# Patient Record
Sex: Male | Born: 1940 | Race: White | Hispanic: No | Marital: Married | Smoking: Former smoker
Health system: Southern US, Community
[De-identification: ages and names within clinical notes are randomized; demographics above are authoritative.]

## PROBLEM LIST (undated history)

## (undated) DIAGNOSIS — I428 Other cardiomyopathies: Secondary | ICD-10-CM

## (undated) DIAGNOSIS — Z95810 Presence of automatic (implantable) cardiac defibrillator: Secondary | ICD-10-CM

## (undated) DIAGNOSIS — R0600 Dyspnea, unspecified: Secondary | ICD-10-CM

## (undated) DIAGNOSIS — R002 Palpitations: Secondary | ICD-10-CM

## (undated) DIAGNOSIS — F419 Anxiety disorder, unspecified: Secondary | ICD-10-CM

## (undated) DIAGNOSIS — E782 Mixed hyperlipidemia: Secondary | ICD-10-CM

## (undated) DIAGNOSIS — I493 Ventricular premature depolarization: Secondary | ICD-10-CM

## (undated) DIAGNOSIS — I447 Left bundle-branch block, unspecified: Secondary | ICD-10-CM

## (undated) DIAGNOSIS — E119 Type 2 diabetes mellitus without complications: Secondary | ICD-10-CM

## (undated) DIAGNOSIS — C801 Malignant (primary) neoplasm, unspecified: Secondary | ICD-10-CM

## (undated) DIAGNOSIS — I509 Heart failure, unspecified: Secondary | ICD-10-CM

## (undated) DIAGNOSIS — N529 Male erectile dysfunction, unspecified: Secondary | ICD-10-CM

## (undated) DIAGNOSIS — K219 Gastro-esophageal reflux disease without esophagitis: Secondary | ICD-10-CM

## (undated) DIAGNOSIS — I10 Essential (primary) hypertension: Secondary | ICD-10-CM

## (undated) DIAGNOSIS — J189 Pneumonia, unspecified organism: Secondary | ICD-10-CM

## (undated) DIAGNOSIS — I5022 Chronic systolic (congestive) heart failure: Secondary | ICD-10-CM

## (undated) DIAGNOSIS — R079 Chest pain, unspecified: Secondary | ICD-10-CM

## (undated) DIAGNOSIS — G4700 Insomnia, unspecified: Secondary | ICD-10-CM

## (undated) DIAGNOSIS — I420 Dilated cardiomyopathy: Secondary | ICD-10-CM

## (undated) DIAGNOSIS — E785 Hyperlipidemia, unspecified: Secondary | ICD-10-CM

## (undated) DIAGNOSIS — R062 Wheezing: Secondary | ICD-10-CM

## (undated) HISTORY — PX: NASAL SINUS SURGERY: SHX719

## (undated) HISTORY — DX: Hyperlipidemia, unspecified: E78.5

## (undated) HISTORY — DX: Essential (primary) hypertension: I10

## (undated) HISTORY — DX: Insomnia, unspecified: G47.00

## (undated) HISTORY — DX: Dilated cardiomyopathy: I42.0

## (undated) HISTORY — DX: Chronic systolic (congestive) heart failure: I50.22

## (undated) HISTORY — DX: Male erectile dysfunction, unspecified: N52.9

---

## 2001-03-05 ENCOUNTER — Encounter: Admission: RE | Admit: 2001-03-05 | Discharge: 2001-03-05 | Payer: Self-pay | Admitting: Family Medicine

## 2001-03-05 ENCOUNTER — Encounter: Payer: Self-pay | Admitting: Family Medicine

## 2001-08-05 ENCOUNTER — Encounter: Admission: RE | Admit: 2001-08-05 | Discharge: 2001-08-05 | Payer: Self-pay | Admitting: Family Medicine

## 2001-08-05 ENCOUNTER — Encounter: Payer: Self-pay | Admitting: Family Medicine

## 2003-09-01 ENCOUNTER — Ambulatory Visit (HOSPITAL_COMMUNITY): Admission: RE | Admit: 2003-09-01 | Discharge: 2003-09-01 | Payer: Self-pay | Admitting: *Deleted

## 2010-08-11 ENCOUNTER — Encounter: Payer: Self-pay | Admitting: Family Medicine

## 2011-04-29 ENCOUNTER — Ambulatory Visit (INDEPENDENT_AMBULATORY_CARE_PROVIDER_SITE_OTHER): Payer: BC Managed Care – PPO | Admitting: Pulmonary Disease

## 2011-04-29 ENCOUNTER — Encounter: Payer: Self-pay | Admitting: Pulmonary Disease

## 2011-04-29 VITALS — BP 126/86 | HR 88 | Temp 98.0°F | Ht 71.0 in | Wt 232.4 lb

## 2011-04-29 DIAGNOSIS — R062 Wheezing: Secondary | ICD-10-CM

## 2011-04-29 DIAGNOSIS — R0609 Other forms of dyspnea: Secondary | ICD-10-CM

## 2011-04-29 DIAGNOSIS — R0600 Dyspnea, unspecified: Secondary | ICD-10-CM

## 2011-04-29 HISTORY — DX: Dyspnea, unspecified: R06.00

## 2011-04-29 HISTORY — DX: Wheezing: R06.2

## 2011-04-29 NOTE — Progress Notes (Signed)
  Subjective:    Patient ID: Louis Juarez, male    DOB: 03/31/1941, 70 y.o.   MRN: [id number]  HPI The patient is a 70 year old male who I've been asked to see for wheezing and dyspnea.  The patient was in his usual state of health with excellent exercise tolerance, and told this summer.  He was working in [employer], and developed severe shortness of breath while trying to walk up a large hill.  He also noted audible wheezing during this time, and he was seen by his primary care doctor where he was treated with an antibiotic.  The patient states that his wheezing resolved after this.  His wheezing has subsequently returned, and the patient states that it is worse at night upon lying down.  He states that throat clearing will usually result in improvement in the wheezing.  The patient admits to having significant postnasal drip, and has a history of recurrent sinusitis.  He denies any history of reflux disease.  The patient states that his dyspnea on exertion has persisted, but only occurs with significant exertional activity.  He states that he can walk unlimited distance on flat ground, even at a moderate pace.  He will get winded climbing stairs and large hills area the patient has a long history of tobacco abuse, but has not smoked since 2006.  He has had spirometry with his primary care doctor, but states that he had a hard time doing this study.  His last x-ray was in June of this year, and he was told that he had "fluid on his lungs".  He denies any known history of heart disease.  His weight has been stable over the last year.  It should be noted the patient takes an ACE inhibitor, and has done so for years.   Review of Systems  Constitutional: Negative for fever and unexpected weight change.  HENT: Positive for congestion and postnasal drip. Negative for ear pain, nosebleeds, sore throat, rhinorrhea, sneezing, trouble swallowing, dental problem and sinus pressure.   Eyes: Negative for redness  and itching.  Respiratory: Positive for cough and shortness of breath. Negative for chest tightness and wheezing.   Cardiovascular: Negative for palpitations and leg swelling.  Gastrointestinal: Negative for nausea and vomiting.  Genitourinary: Negative for dysuria.  Musculoskeletal: Negative for joint swelling.  Skin: Negative for rash.  Neurological: Negative for headaches.  Hematological: Does not bruise/bleed easily.  Psychiatric/Behavioral: Negative for dysphoric mood. The patient is not nervous/anxious.        Objective:   Physical Exam Constitutional:  Overweight male, no acute distress  HENT:  Nares patent without discharge  Oropharynx without exudate, palate and uvula are normal  Eyes:  Right pupil > left, eomi, no scleral icterus  Neck:  No JVD, no TMG  Cardiovascular:  Normal rate, regular rhythm, no rubs or gallops.  No murmurs        Intact distal pulses  Pulmonary :  Mildly decreased breath sounds, no stridor or respiratory distress   No rales, rhonchi, or wheezing  Abdominal:  Soft, nondistended, bowel sounds present.  No tenderness noted.   Musculoskeletal:  No lower extremity edema noted.  Lymph Nodes:  No cervical lymphadenopathy noted  Skin:  No cyanosis noted  Neurologic:  Alert, appropriate, moves all 4 extremities without obvious deficit.        Assessment & Plan:

## 2011-04-29 NOTE — Assessment & Plan Note (Signed)
The patient has dyspnea on exertion primarily with heavier activities, but he feels this is new for him.  He has no significant obstruction on spirometry today, but does have some mild restriction.  I suspect this is due to his centripetal obesity.I have asked him to work on weight loss and also an exercise program, but if his shortness of breath persists, would consider whether he needs a cardiac evaluation.

## 2011-04-29 NOTE — Patient Instructions (Addendum)
You do not have emphysema by your breathing tests today.  You do need to work on weight loss to help your breathing.  Stop altace Would try chlorpheniramine 8mg  OTC at bedtime for the next few weeks to see if it helps your postnasal drip Please give me some feedback in next 3-4 weeks with how the wheezing is doing.

## 2011-04-29 NOTE — Assessment & Plan Note (Signed)
The patient is describing a wheeze that is most likely coming from the upper airway.  He has postnasal drip and a history of recurrent sinusitis, but also is taking an ACE inhibitor which can destabilize the upper airway.  I have asked him to try chlorpheniramine over-the-counter, and also would discontinue his ACE inhibitor for a period of time to see if things improve.

## 2011-06-03 ENCOUNTER — Encounter: Payer: Self-pay | Admitting: Pulmonary Disease

## 2011-06-03 ENCOUNTER — Telehealth: Payer: Self-pay | Admitting: Pulmonary Disease

## 2011-06-03 ENCOUNTER — Ambulatory Visit (INDEPENDENT_AMBULATORY_CARE_PROVIDER_SITE_OTHER): Payer: BC Managed Care – PPO | Admitting: Pulmonary Disease

## 2011-06-03 DIAGNOSIS — R0609 Other forms of dyspnea: Secondary | ICD-10-CM

## 2011-06-03 DIAGNOSIS — R0600 Dyspnea, unspecified: Secondary | ICD-10-CM

## 2011-06-03 DIAGNOSIS — R062 Wheezing: Secondary | ICD-10-CM

## 2011-06-03 DIAGNOSIS — R0989 Other specified symptoms and signs involving the circulatory and respiratory systems: Secondary | ICD-10-CM

## 2011-06-03 NOTE — Patient Instructions (Signed)
Start on nexium am and pm for the next few weeks. Will check scan of your sinuses, and call you with results. If the scan of your sinuses is normal, will start on dulera 100/5  2 inhalations am and pm.  Rinse mouth well.   If you start on the inhaler, I want you to call me in 2 weeks to let me know how things are going.

## 2011-06-03 NOTE — Assessment & Plan Note (Signed)
The patient continues to have audible wheezing, however had no airflow obstruction on spirometry last visit.  He does have some squeaks in his upper lung zones anteriorly, but posteriorly is totally clear.  I continued to believe this is more upper airway in origin, and with his history of severe sinusitis in the past with multiple surgeries, would like to do a scan of his sinuses.  If this fails to show any significant sinusitis, I will give him a trial of LABA/ICS.  We'll also treat him emperically for laryngopharyngeal reflux, although he does not feel this is a significant issue.

## 2011-06-03 NOTE — Progress Notes (Signed)
  Subjective:    Patient ID: Louis Juarez, male    DOB: [birth year], 70 y.o.   MRN: [id number]  HPI Patient comes in today for followup of his "wheezing" and also dyspnea on exertion.  At the last visit, he was taken off his ACE inhibitor, and also asked to take a sedating antihistamine for postnasal drip.  He comes in today where he really has not seen a big difference in his symptoms.  He states that his wheezing is worse at night, and also in the mornings upon arising.  He does not have increased shortness of breath during this time, but only with heavier exertional activities.   Review of Systems  Constitutional: Negative for fever and unexpected weight change.  HENT: Negative for ear pain, nosebleeds, congestion, sore throat, rhinorrhea, sneezing, trouble swallowing, dental problem, postnasal drip and sinus pressure.   Eyes: Negative for redness and itching.  Respiratory: Positive for cough, shortness of breath and wheezing. Negative for chest tightness.   Cardiovascular: Negative for palpitations and leg swelling.  Gastrointestinal: Negative for nausea and vomiting.  Genitourinary: Negative for dysuria.  Musculoskeletal: Negative for joint swelling.  Skin: Negative for rash.  Neurological: Negative for headaches.  Hematological: Does not bruise/bleed easily.  Psychiatric/Behavioral: Negative for dysphoric mood. The patient is not nervous/anxious.        Objective:   Physical Exam Obese male in no acute distress Nose without obvious purulence or discharge Chest with inspiratory squeaks near the clavicles anteriorly, but otherwise totally clear to auscultation Heart exam with regular rate and rhythm Lower extremities without edema, no cyanosis noted Alert and oriented, moves all 4 extremities.       Assessment & Plan:

## 2011-06-03 NOTE — Telephone Encounter (Signed)
Spoke with pt and he c/o increased SOB and wheeze x 2 days. States that "sounds like two cats fighting in my chest"- OV with KC at 3:45 and advised seek emergency care in the meantime if needed.

## 2011-06-05 ENCOUNTER — Ambulatory Visit (INDEPENDENT_AMBULATORY_CARE_PROVIDER_SITE_OTHER)
Admission: RE | Admit: 2011-06-05 | Discharge: 2011-06-05 | Disposition: A | Discharge status: Home / Self Care | Payer: BC Managed Care – PPO | Source: Ambulatory Visit | Attending: Pulmonary Disease | Admitting: Pulmonary Disease

## 2011-06-05 DIAGNOSIS — R0609 Other forms of dyspnea: Secondary | ICD-10-CM

## 2011-06-05 DIAGNOSIS — R062 Wheezing: Secondary | ICD-10-CM

## 2011-06-05 DIAGNOSIS — R0600 Dyspnea, unspecified: Secondary | ICD-10-CM

## 2011-06-09 ENCOUNTER — Other Ambulatory Visit: Payer: Self-pay | Admitting: *Deleted

## 2011-06-09 MED ORDER — ESOMEPRAZOLE MAGNESIUM 40 MG PO CPDR: 40.0000 mg | DELAYED_RELEASE_CAPSULE | Freq: Two times a day (BID) | ORAL | Status: DC

## 2011-06-20 ENCOUNTER — Telehealth: Payer: Self-pay | Admitting: Pulmonary Disease

## 2011-06-20 NOTE — Telephone Encounter (Signed)
Spoke with pt and notified of recs per PW. Pt verbalized understanding and will come in around 2 pm this afternoon for [facility] instruct. Lori aware sample in triage.

## 2011-06-20 NOTE — Telephone Encounter (Signed)
Have him stop by office to pick up 200 Dulera two puff bid,  While here, reinstruct him as to proper use of HFA inhaler Keep 12/21 OV with [facility]

## 2011-06-20 NOTE — Telephone Encounter (Signed)
Pt came in this afternoon and i re-instructed him on the use of the Dulera 2 puffs bid. Rinse mouth out after use.  Pt verbalized his understanding.  Pt did comment that although the Nexium has helped decrease his mucus "a lot" he does notice he will have increased mucus when he eats spicy foods and wanted to know if we had an information regarding diet, so I gave him the GERD diet sheet.  Pt appreciated this.

## 2011-06-20 NOTE — Telephone Encounter (Signed)
Called and spoke with pt. He states that [facility] gave recs to start nexium bid and also dulera 100 2 puffs bid. He states that since starting these meds his wheezing and mucus production is much less, but DOE seems worse just over the past couple of days. He states got OOB walking approx 10 steps last night. Has ov pending with KC for 07/11/11 but wants to see him sooner and does not wish to see another provider. No openings next wk with KC. Will have to forward to doc of the day. Please advise, thanks!

## 2011-06-23 ENCOUNTER — Telehealth: Payer: Self-pay | Admitting: Pulmonary Disease

## 2011-06-23 NOTE — Telephone Encounter (Signed)
I spoke with Louis Juarez and she states to see if pt can come in at 10:15 tomorrow am. I advised pt of this and he stated that was fine that he could come in at that time. Apt has been made and nothing further was needed

## 2011-06-23 NOTE — Telephone Encounter (Signed)
Pt states he's returning call in reference to his appt tomorrow can be reached at [phone].Raylene Everts

## 2011-06-23 NOTE — Telephone Encounter (Signed)
Called and spoke with pt. He states that his breathing is not improving on higher dose of dulera. He states getting very SOB with exertion and wants appt with KC. Nothing available with [facility] today or tomorrow. I have sched him appt with TP for tomorrow am and advised if symptoms persist/worsen at all needs to go to ED. Pt verbalized understanding.

## 2011-06-23 NOTE — Telephone Encounter (Signed)
[email or web address]  

## 2011-06-23 NOTE — Telephone Encounter (Signed)
Patient returning call.

## 2011-06-24 ENCOUNTER — Ambulatory Visit (INDEPENDENT_AMBULATORY_CARE_PROVIDER_SITE_OTHER): Payer: BC Managed Care – PPO | Admitting: Adult Health

## 2011-06-24 ENCOUNTER — Ambulatory Visit (INDEPENDENT_AMBULATORY_CARE_PROVIDER_SITE_OTHER)
Admission: RE | Admit: 2011-06-24 | Discharge: 2011-06-24 | Disposition: A | Discharge status: Home / Self Care | Payer: BC Managed Care – PPO | Source: Ambulatory Visit | Attending: Adult Health | Admitting: Adult Health

## 2011-06-24 ENCOUNTER — Telehealth: Payer: Self-pay | Admitting: Pulmonary Disease

## 2011-06-24 ENCOUNTER — Ambulatory Visit: Payer: BC Managed Care – PPO | Admitting: Adult Health

## 2011-06-24 ENCOUNTER — Other Ambulatory Visit (INDEPENDENT_AMBULATORY_CARE_PROVIDER_SITE_OTHER): Payer: BC Managed Care – PPO

## 2011-06-24 ENCOUNTER — Encounter: Payer: Self-pay | Admitting: Adult Health

## 2011-06-24 DIAGNOSIS — R05 Cough: Secondary | ICD-10-CM

## 2011-06-24 DIAGNOSIS — R9389 Abnormal findings on diagnostic imaging of other specified body structures: Secondary | ICD-10-CM

## 2011-06-24 DIAGNOSIS — R0609 Other forms of dyspnea: Secondary | ICD-10-CM

## 2011-06-24 DIAGNOSIS — R042 Hemoptysis: Secondary | ICD-10-CM

## 2011-06-24 DIAGNOSIS — R0600 Dyspnea, unspecified: Secondary | ICD-10-CM

## 2011-06-24 DIAGNOSIS — R918 Other nonspecific abnormal finding of lung field: Secondary | ICD-10-CM

## 2011-06-24 LAB — BASIC METABOLIC PANEL
BUN: 15 mg/dL (ref 6–23)
Chloride: 105 mEq/L (ref 96–112)
GFR: 85.1 mL/min (ref >60.00)
Potassium: 4.2 mEq/L (ref 3.5–5.1)

## 2011-06-24 LAB — CBC WITH DIFFERENTIAL/PLATELET
Basophils Absolute: 0.1 10*3/uL (ref 0.0–0.1)
Eosinophils Absolute: 0.1 10*3/uL (ref 0.0–0.7)
Hemoglobin: 13.8 g/dL (ref 13.0–17.0)
Lymphocytes Relative: 18.5 % (ref 12.0–46.0)
MCHC: 34.2 g/dL (ref 30.0–36.0)
Monocytes Relative: 5.2 % (ref 3.0–12.0)
Neutro Abs: 7 10*3/uL (ref 1.4–7.7)
Neutrophils Relative %: 74.5 % (ref 43.0–77.0)
Platelets: 210 10*3/uL (ref 150.0–400.0)
RDW: 15.3 % — ABNORMAL HIGH (ref 11.5–14.6)

## 2011-06-24 LAB — SEDIMENTATION RATE: Sed Rate: 18 mm/hr (ref 0–22)

## 2011-06-24 LAB — BRAIN NATRIURETIC PEPTIDE: Pro B Natriuretic peptide (BNP): 405 pg/mL — ABNORMAL HIGH (ref 0.0–100.0)

## 2011-06-24 NOTE — Progress Notes (Signed)
Subjective:    Patient ID: Louis Juarez, male    DOB: 1940/08/03, 70 y.o.   MRN: [id number]  HPI 70 yo WM seen for initial pulmonary consult 04/29/11 for wheezing and dyspnea.   06/24/2011 Acute OV  Complains of  Pt was seen for initial pulmonary evaluation 04/29/11 for 4 months of wheezing and dyspnea. Seen by PCP given abx and told he had fluid on his xray . Symptoms started in June of this year. Prior to this he considered himself in good health with good exercise tolerance. 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 Initially taken off his ACE inhibitor by Dr. Shelle Iron without much improvement. Then seen 3 weeks ago , FEV1 at 71% w/ minimal obstruciton , ratio 71. He was started on Dulera and nexium along chlor tab. He says he had no improvement .  He has finished both. He has had 2 abx from his PCP office- last in 04/2011 . Feels better while on abx but never back to baseline and when off abx . symtpoms return. CT sinus done last ov with minimal sinus dz, no active infection   Today in the office , he has no desaturations with walking. Does have improvement in subjective symptoms with xopenex neb. Main complaint is DOE along with cough with intermittent sputum that changes from clear to green . Last 4 days has had some tr blood mixed with green mucus.  CXR today showed bilateral increased interstitial aspdz in bases. Review of films for last few months shows interstitial markings on xray . He denies unusual hobbies, has a cat. No known exposure to chemical or fumes. No weight loss or edema no family hx of heart dz.      Review of Systems Constitutional:   No  weight loss, night sweats,  Fevers, chills, fatigue, or  lassitude.  HEENT:   No headaches,  Difficulty swallowing,  Tooth/dental problems, or  Sore throat,                No sneezing, itching, ear ache, nasal congestion, post nasal drip,   CV:  No chest pain,  Orthopnea, PND,  swelling in lower extremities, anasarca, dizziness, palpitations, syncope.   GI  No heartburn, indigestion, abdominal pain, nausea, vomiting, diarrhea, change in bowel habits, loss of appetite, bloody stools.   Resp:    No chest wall deformity  Skin: no rash or lesions.  GU: no dysuria, change in color of urine, no urgency or frequency.  No flank pain, no hematuria   MS:  No joint pain or swelling.  No decreased range of motion.  No back pain.  Psych:  No change in mood or affect. No depression or anxiety.  No memory loss.         Objective:   Physical Exam GEN: A/Ox3; pleasant , NAD, well nourished   HEENT:  [place]/AT,  EACs-clear, TMs-wnl, NOSE-clear, THROAT-clear, no lesions, no postnasal drip or exudate noted.   NECK:  Supple w/ fair ROM; no JVD; normal carotid impulses w/o bruits; no thyromegaly or nodules palpated; no lymphadenopathy.  RESP  Coarse BS .no accessory muscle use, no dullness to percussion  CARD:  RRR, no m/r/g  , no peripheral edema, pulses intact, no cyanosis or clubbing.  GI:   Soft & nt; nml bowel sounds; no organomegaly or masses detected.  Musco: Warm bil, no deformities or joint swelling noted.   Neuro: alert, no focal deficits noted.  Skin: Warm, no lesions or rashes         Assessment & Plan:

## 2011-06-24 NOTE — Telephone Encounter (Signed)
Yes that is what the ct was suppose to be put in as  CT chest -High resolution

## 2011-06-24 NOTE — Telephone Encounter (Signed)
I spoke with CT dept and they are aware to do CT w/ high resolution

## 2011-06-24 NOTE — Patient Instructions (Signed)
May use ProAir 2 puffs every 4 hr as needed for shortness of breath/wheezing.  We are setting you up for a CT of chest - I will call with results.  I will call with labs.  I will be in touch regarding next appointment.

## 2011-06-24 NOTE — Progress Notes (Signed)
Ov reviewed.  Discussed case and plans with NP.

## 2011-06-24 NOTE — Assessment & Plan Note (Signed)
?   Etiology with abnormal xray -interstitial marking  Case and xray reviewed with Dr. Shelle Iron  Will proceed with CT chest -HR prior to any additional tr.  Will also check bnp and esr  Follow cT chest results.

## 2011-06-24 NOTE — Telephone Encounter (Signed)
I spoke with rose and she states pt is scheduled for his CT tomorrow. She is wanting to know does Tammy want high res cuts. Please advise, tammy, thanks

## 2011-06-25 ENCOUNTER — Ambulatory Visit (INDEPENDENT_AMBULATORY_CARE_PROVIDER_SITE_OTHER)
Admission: RE | Admit: 2011-06-25 | Discharge: 2011-06-25 | Disposition: A | Discharge status: Home / Self Care | Payer: BC Managed Care – PPO | Source: Ambulatory Visit | Attending: Adult Health | Admitting: Adult Health

## 2011-06-25 DIAGNOSIS — R9389 Abnormal findings on diagnostic imaging of other specified body structures: Secondary | ICD-10-CM

## 2011-06-25 DIAGNOSIS — R918 Other nonspecific abnormal finding of lung field: Secondary | ICD-10-CM

## 2011-06-26 ENCOUNTER — Other Ambulatory Visit: Payer: Self-pay | Admitting: Adult Health

## 2011-06-26 ENCOUNTER — Other Ambulatory Visit: Payer: BC Managed Care – PPO

## 2011-06-26 DIAGNOSIS — R0600 Dyspnea, unspecified: Secondary | ICD-10-CM

## 2011-06-26 DIAGNOSIS — I5022 Chronic systolic (congestive) heart failure: Secondary | ICD-10-CM | POA: Insufficient documentation

## 2011-06-26 DIAGNOSIS — I509 Heart failure, unspecified: Secondary | ICD-10-CM

## 2011-06-26 MED ORDER — FUROSEMIDE 20 MG PO TABS: ORAL_TABLET | ORAL | Status: DC

## 2011-06-26 NOTE — Progress Notes (Signed)
Per 12.6.12 result note to CT Chest, rx for lasix 20mg  2 daily x 2 days then 1 daily sent to verified pharmacy, 2D Echo, bmet, bnp, referral to cardiology placed.  Pt scheduled for 4 day follow up with TP on 12.10.12 @ 2:15pm and will arrive approx 30-74mins early for labs.  Labs placed as STAT for 12.10.12 pt aware if breathing worsens over the weekend to seek emergency help.

## 2011-06-30 ENCOUNTER — Ambulatory Visit (HOSPITAL_COMMUNITY): Payer: BC Managed Care – HMO | Attending: Internal Medicine | Admitting: Radiology

## 2011-06-30 ENCOUNTER — Encounter: Payer: Self-pay | Admitting: Adult Health

## 2011-06-30 ENCOUNTER — Ambulatory Visit (INDEPENDENT_AMBULATORY_CARE_PROVIDER_SITE_OTHER): Payer: BC Managed Care – HMO | Admitting: Adult Health

## 2011-06-30 ENCOUNTER — Other Ambulatory Visit (INDEPENDENT_AMBULATORY_CARE_PROVIDER_SITE_OTHER): Payer: BC Managed Care – PPO

## 2011-06-30 DIAGNOSIS — R0989 Other specified symptoms and signs involving the circulatory and respiratory systems: Secondary | ICD-10-CM

## 2011-06-30 DIAGNOSIS — I428 Other cardiomyopathies: Secondary | ICD-10-CM

## 2011-06-30 DIAGNOSIS — I509 Heart failure, unspecified: Secondary | ICD-10-CM | POA: Insufficient documentation

## 2011-06-30 DIAGNOSIS — R0609 Other forms of dyspnea: Secondary | ICD-10-CM | POA: Insufficient documentation

## 2011-06-30 DIAGNOSIS — I059 Rheumatic mitral valve disease, unspecified: Secondary | ICD-10-CM | POA: Insufficient documentation

## 2011-06-30 DIAGNOSIS — R0600 Dyspnea, unspecified: Secondary | ICD-10-CM

## 2011-06-30 DIAGNOSIS — E669 Obesity, unspecified: Secondary | ICD-10-CM | POA: Insufficient documentation

## 2011-06-30 DIAGNOSIS — I079 Rheumatic tricuspid valve disease, unspecified: Secondary | ICD-10-CM | POA: Insufficient documentation

## 2011-06-30 HISTORY — DX: Other cardiomyopathies: I42.8

## 2011-06-30 LAB — BASIC METABOLIC PANEL
Calcium: 8.9 mg/dL (ref 8.4–10.5)
Chloride: 103 mEq/L (ref 96–112)
Creatinine, Ser: 1.2 mg/dL (ref 0.4–1.5)
Sodium: 141 mEq/L (ref 135–145)

## 2011-06-30 LAB — BRAIN NATRIURETIC PEPTIDE: Pro B Natriuretic peptide (BNP): 372 pg/mL — ABNORMAL HIGH (ref 0.0–100.0)

## 2011-06-30 NOTE — Patient Instructions (Signed)
Continue on Lasix 20 mg daily.  Low salt diet  Follow up Dr. Eden Emms as planned 07/02/11 .  Please contact office for sooner follow up if symptoms do not improve or worsen or seek emergency care

## 2011-07-01 LAB — TSH: TSH: 0.71 u[IU]/mL (ref 0.35–5.50)

## 2011-07-02 ENCOUNTER — Ambulatory Visit (INDEPENDENT_AMBULATORY_CARE_PROVIDER_SITE_OTHER): Payer: BC Managed Care – HMO | Admitting: Cardiovascular Disease

## 2011-07-02 ENCOUNTER — Encounter: Payer: Self-pay | Admitting: *Deleted

## 2011-07-02 ENCOUNTER — Encounter: Payer: Self-pay | Admitting: Cardiovascular Disease

## 2011-07-02 DIAGNOSIS — I509 Heart failure, unspecified: Secondary | ICD-10-CM

## 2011-07-02 DIAGNOSIS — I493 Ventricular premature depolarization: Secondary | ICD-10-CM | POA: Insufficient documentation

## 2011-07-02 DIAGNOSIS — E782 Mixed hyperlipidemia: Secondary | ICD-10-CM

## 2011-07-02 DIAGNOSIS — I447 Left bundle-branch block, unspecified: Secondary | ICD-10-CM | POA: Insufficient documentation

## 2011-07-02 DIAGNOSIS — I4949 Other premature depolarization: Secondary | ICD-10-CM

## 2011-07-02 HISTORY — DX: Ventricular premature depolarization: I49.3

## 2011-07-02 HISTORY — DX: Left bundle-branch block, unspecified: I44.7

## 2011-07-02 HISTORY — DX: Mixed hyperlipidemia: E78.2

## 2011-07-02 MED ORDER — CARVEDILOL 6.25 MG PO TABS: 6.2500 mg | ORAL_TABLET | Freq: Two times a day (BID) | ORAL | Status: DC

## 2011-07-02 MED ORDER — LOSARTAN POTASSIUM 50 MG PO TABS: 50.0000 mg | ORAL_TABLET | Freq: Every day | ORAL | Status: DC

## 2011-07-02 NOTE — Assessment & Plan Note (Signed)
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  Adding beta blocker and ARB.  F/U two weeks.  Once on stable medical regiman will have right and left heart cath to R/O CAD and assess filling pressures.

## 2011-07-02 NOTE — Assessment & Plan Note (Signed)
Chronic.  No evidence of high grade heart block

## 2011-07-02 NOTE — Patient Instructions (Signed)
Your physician recommends that you schedule a follow-up appointment in: 2 weeks with Dr. Eden Emms.

## 2011-07-02 NOTE — Progress Notes (Signed)
Note reviewed and agree with plan as outlined I have reviewed the pt's ct chest, and there is really minimal IS disease.

## 2011-07-02 NOTE — Progress Notes (Signed)
Subjective:    Patient ID: Louis Juarez, male    DOB: 1941/02/23, 70 y.o.   MRN: [id number]  HPI  70 yo WM seen for initial pulmonary consult 04/29/11 for wheezing and dyspnea.   06/24/2011 Acute OV  Complains of  Pt was seen for initial pulmonary evaluation 04/29/11 for 4 months of wheezing and dyspnea. Seen by PCP given abx and told he had fluid on his xray . Symptoms started in June of this year. Prior to this he considered himself in good health with good exercise tolerance. 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 Initially taken off his ACE inhibitor by Dr. Shelle Iron without much improvement. Then seen 3 weeks ago , FEV1 at 68% w/ minimal obstruciton , ratio 71. He was started on Dulera and nexium along chlor tab. He says he had no improvement .  He has finished both. He has had 2 abx from his PCP office- last in 04/2011 . Feels better while on abx but never back to baseline and when off abx . symtpoms return. CT sinus done last ov with minimal sinus dz, no active infection   Today in the office , he has no desaturations with walking. Does have improvement in subjective symptoms with xopenex neb. Main complaint is DOE along with cough with intermittent sputum that changes from clear to green . Last 4 days has had some tr blood mixed with green mucus.  CXR today showed bilateral increased interstitial aspdz in bases. Review of films for last few months shows interstitial markings on xray . He denies unusual hobbies, has a cat. No known exposure to chemical or fumes. No weight loss or edema no family hx of heart dz.  >>rx Lasix , CT chest and labs , referred to [facility]   06/30/11 Follow up  Pt returns for 1 week follow up. Seen last week with 6 months of progressive DOE . He was set up for CT chest last ov to evaluate. Scan was neg for PE however showed bilateral pl. Effusions -suspicious for CHF. BNP was ~400. He was started on Lasix 40mg   and set up for a echo. Echo was done today shows LVEF - 10 to 15% inferior, septal, apical akinesis; hypokinesis elsewhere,  pattern of mild LVH. Left atrium: The atrium was severely dilated.- Right ventricle: Systolic function was moderately reduced.- Right atrium: The atrium was mildly dilated.: PA peak pressure: 52mm Hg   Pt feels better since starting on Lasix. Feels he can walk further with less dyspnea.  He denies any chest pain. Has no significant family hx of heart dz.  Labs today show bnp has declined slightly from 405 to 372.     Review of Systems  Constitutional:   No  weight loss, night sweats,  Fevers, chills,  +fatigue, or  lassitude.  HEENT:   No headaches,  Difficulty swallowing,  Tooth/dental problems, or  Sore throat,                No sneezing, itching, ear ache, nasal congestion, post nasal drip,   CV:  No chest pain,  Orthopnea, PND,   anasarca, dizziness, palpitations, syncope.   GI  No heartburn, indigestion, abdominal pain, nausea, vomiting, diarrhea, change in bowel habits, loss of appetite, bloody stools.   Resp:    No chest wall deformity  Skin: no rash or lesions.  GU: no dysuria, change in color of urine, no urgency or frequency.  No flank pain, no hematuria   MS:  No joint pain or swelling.  No decreased range of motion.  No back pain.  Psych:  No change in mood or affect. No depression or anxiety.  No memory loss.         Objective:   Physical Exam  GEN: A/Ox3; pleasant , NAD, well nourished   HEENT:  [place]/AT,  EACs-clear, TMs-wnl, NOSE-clear, THROAT-clear, no lesions, no postnasal drip or exudate noted.   NECK:  Supple w/ fair ROM; no JVD; normal carotid impulses w/o bruits; no thyromegaly or nodules palpated; no lymphadenopathy.  RESP  Coarse BS .no accessory muscle use, no dullness to percussion  CARD:  RRR, no m/r/g  , no peripheral edema, pulses intact, no cyanosis or clubbing.  GI:   Soft & nt; nml bowel sounds; no organomegaly or  masses detected.  Musco: Warm bil, no deformities or joint swelling noted.   Neuro: alert, no focal deficits noted.    Skin: Warm, no lesions or rashes   2 D Echo 06/30/11   Left ventricle: LVEF is approximately 10 to 15% with inferior, septal, apical akinesis; hypokinesis elsehwere The cavity size was mildly dilated. Wall thickness was increased in a pattern of mild LVH. - Aortic valve: AV is thckened, calcified with no signifi stenosis. - Left atrium: The atrium was severely dilated. - Right ventricle: Systolic function was moderately reduced. - Right atrium: The atrium was mildly dilated. - Pulmonary arteries: PA peak pressure: 52mm Hg (S).   CT chest 06/24/11  Small bilateral pleural effusions, right greater than left.  Centrilobular emphysema. There is septal thickening at the lung  Bases.  IMPRESSION:  1. Congestive heart failure.  2. Question mild subpleural reticulation, indicative of fibrosis.  3. Coronary artery calcification.  4. Chronic calcific pancreatitis.  TSH nml  BNP 405 > 372       Assessment & Plan:

## 2011-07-02 NOTE — Assessment & Plan Note (Signed)
New finding of Cardiomyopathy on echo , pt has ov with cards on 07/02/11 already set up .  Discussed findings with pt in detail.   Plan;  Will cont with lasix -clinically improved.  Will defer beta blocker /ace to cards.

## 2011-07-02 NOTE — Progress Notes (Signed)
Patient ID: Louis Juarez, male   DOB: 01-25-1941, 70 y.o.   MRN: [id number] 70 yo referred by Jeanmarie Plant for dyspnea.  Since June has been dyspnic.  ? Lung disease and asthmatic component.  However recent CT showed pleural effusion and CHF.  F/U echo with EF 10-20%  Reviewed.  No previous history of CHF, MI or edema.  Started on lasix a few days ago with improvement in exertional dyspnea.  Previously seen by Dr Marcy Panning with LBBB.  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  Compliant with meds.  Functional class 3 prior to lasix.  Long discussion with wife and patient about diagnosis of CHF, prognosis and w/u.  Infrequent drinker and no other obvious etiology.  Prefer to get on stable medical regiman before doing right and left heart cath.  Will add beta blocker since acute dyspnea improved and add ARB.    Denies syncope, palpitations.  Dyspnea progressive since June.  Quit smoking in 77  ROS: Denies fever, malais, weight loss, blurry vision, decreased visual acuity, cough, sputum, SOB, hemoptysis, pleuritic pain, palpitaitons, heartburn, abdominal pain, melena, lower extremity edema, claudication, or rash.  All other systems reviewed and negative   General: Affect appropriate Healthy:  appears stated age HEENT: normal Neck supple with no adenopathy JVP normal no bruits no thyromegaly Lungs clear with no wheezing and good diaphragmatic motion Heart:  S1/S2 no murmur,rub, gallop or click PMI normal Abdomen: benighn, BS positve, no tenderness, no AAA no bruit.  No HSM or HJR Distal pulses intact with no bruits No edema Neuro non-focal Skin warm and dry No muscular weakness  Medications Current Outpatient Prescriptions  Medication Sig Dispense Refill  . albuterol (PROVENTIL HFA;VENTOLIN HFA) 108 (90 BASE) MCG/ACT inhaler Inhale 2 puffs into the lungs 3 (three) times daily.        Marland Kitchen amitriptyline (ELAVIL) 100 MG tablet Take 100 mg by mouth at bedtime.        Marland Kitchen  aspirin 81 MG tablet Take 81 mg by mouth daily.        . furosemide (LASIX) 20 MG tablet Take 2 tabs by mouth once daily x 2 days beginning 06-27-11, then 1 daily.  30 tablet  0  . ketoconazole (NIZORAL) 2 % cream Apply 1 application topically daily.        Marland Kitchen LORazepam (ATIVAN) 0.5 MG tablet Take 0.5 mg by mouth 2 (two) times daily as needed.        . Multiple Vitamins-Minerals (MULTIVITAMIN WITH MINERALS) tablet Take 1 tablet by mouth daily.        . rosuvastatin (CRESTOR) 20 MG tablet Take 20 mg by mouth daily.        Marland Kitchen zolpidem (AMBIEN) 10 MG tablet Take 1 tablet by mouth At bedtime.      . carvedilol (COREG) 6.25 MG tablet Take 1 tablet (6.25 mg total) by mouth 2 (two) times daily.  60 tablet  11  . losartan (COZAAR) 50 MG tablet Take 1 tablet (50 mg total) by mouth daily.  30 tablet  11    Allergies Review of patient's allergies indicates no known allergies.  Family History: Family History  Problem Relation Age of Onset  . Colon cancer Father   . Melanoma Mother   . Melanoma Sister     Social History: History   Social History  . Marital Status: Married    Spouse Name: N/A    Number of Children: N/A  .  Years of Education: N/A   Occupational History  . retired    Social History Main Topics  . Smoking status: Former Smoker -- 1.5 packs/day for 20 years    Types: Cigarettes    Quit date: 07/21/2001  . Smokeless tobacco: Not on file  . Alcohol Use: No  . Drug Use: No  . Sexually Active: Not on file   Other Topics Concern  . Not on file   Social History Narrative  . No narrative on file    Electrocardiogram:  NSR LBBB PVC;s rate 90  Assessment and Plan

## 2011-07-02 NOTE — Assessment & Plan Note (Signed)
Suspect is due to underlying cardiomyopathy  Pt to continue on lasix  follow up cards this week as planned  Dr. Shelle Iron aware and case discussed in detail .

## 2011-07-02 NOTE — Assessment & Plan Note (Signed)
Reflective of DCM.  Add beta blocker  R/O CAD

## 2011-07-02 NOTE — Assessment & Plan Note (Signed)
Cholesterol is at goal.  Continue current dose of statin and diet Rx.  No myalgias or side effects.  F/U  LFT's in 6 months. No results found for this basename: LDLCALC             

## 2011-07-03 ENCOUNTER — Telehealth: Payer: Self-pay | Admitting: Cardiovascular Disease

## 2011-07-03 NOTE — Telephone Encounter (Signed)
SPOKE WITH PT'S WIFE WANTED CLARIFICATION  WITH START OF MED  CARVEDILOL  WIFE INSTRUCTED MED MAY INCREASE EF  IF DOES THEN MAY NOT NEED A DEVICE

## 2011-07-03 NOTE — Telephone Encounter (Signed)
Pt spouse has some questions about a statement Dr. Eden Emms made regarding medication and how it works

## 2011-07-04 ENCOUNTER — Telehealth: Payer: Self-pay | Admitting: Cardiovascular Disease

## 2011-07-04 MED ORDER — FUROSEMIDE 20 MG PO TABS: 20.0000 mg | ORAL_TABLET | Freq: Every day | ORAL | Status: DC

## 2011-07-04 NOTE — Telephone Encounter (Signed)
Spoke with pt, refill for furosemide complete

## 2011-07-04 NOTE — Telephone Encounter (Signed)
New message: pt has a question about him medication.  Please call him back

## 2011-07-04 NOTE — Telephone Encounter (Signed)
Will forward to triage since Wynona Canes is out today.

## 2011-07-10 ENCOUNTER — Encounter: Payer: Self-pay | Admitting: Cardiovascular Disease

## 2011-07-11 ENCOUNTER — Ambulatory Visit: Payer: BC Managed Care – PPO | Admitting: Pulmonary Disease

## 2011-07-16 ENCOUNTER — Telehealth: Payer: Self-pay | Admitting: Pulmonary Disease

## 2011-07-16 NOTE — Telephone Encounter (Signed)
I spoke with pt and he states he wants to speak with TP directly. Pt would not advise what it was regarding. Pt aware TP is out of the office until middle of next week. Pt was fine with that and states it was not an emergency. Please advise tammy, thanks

## 2011-07-17 ENCOUNTER — Encounter: Payer: Self-pay | Admitting: Physician Assistant

## 2011-07-18 ENCOUNTER — Encounter: Payer: Self-pay | Admitting: Physician Assistant

## 2011-07-18 ENCOUNTER — Ambulatory Visit (INDEPENDENT_AMBULATORY_CARE_PROVIDER_SITE_OTHER): Payer: BC Managed Care – HMO | Admitting: Physician Assistant

## 2011-07-18 DIAGNOSIS — E782 Mixed hyperlipidemia: Secondary | ICD-10-CM

## 2011-07-18 DIAGNOSIS — I10 Essential (primary) hypertension: Secondary | ICD-10-CM

## 2011-07-18 DIAGNOSIS — G4700 Insomnia, unspecified: Secondary | ICD-10-CM

## 2011-07-18 DIAGNOSIS — I119 Hypertensive heart disease without heart failure: Secondary | ICD-10-CM | POA: Insufficient documentation

## 2011-07-18 DIAGNOSIS — I5022 Chronic systolic (congestive) heart failure: Secondary | ICD-10-CM

## 2011-07-18 HISTORY — DX: Hypertensive heart disease without heart failure: I11.9

## 2011-07-18 LAB — BASIC METABOLIC PANEL
Calcium: 9.4 mg/dL (ref 8.4–10.5)
Chloride: 103 mEq/L (ref 96–112)
Creatinine, Ser: 1 mg/dL (ref 0.4–1.5)
Sodium: 139 mEq/L (ref 135–145)

## 2011-07-18 MED ORDER — CARVEDILOL 12.5 MG PO TABS: 12.5000 mg | ORAL_TABLET | Freq: Two times a day (BID) | ORAL | Status: DC

## 2011-07-18 NOTE — Assessment & Plan Note (Signed)
He notes problems with worsening insomnia.  He is already on several sleep aids.  I asked him to followup with his PCP for further management.

## 2011-07-18 NOTE — Progress Notes (Signed)
[address] [place], [place]  [address] Phone: [phone] Fax:  [phone]  Date:  07/18/2011   Name:  Louis Juarez       DOB:  10/07/1940 MRN:  [id number]  PCP:  Dr. Foy Guadalajara Primary Cardiologist:  Dr. Charlton Haws  Primary Electrophysiologist:  None    History of Present Illness: Louis Juarez is a 70 y.o. male who presents for follow up.  He recently established with Dr. Eden Emms on 12/12 with newly diagnosed cardiomyopathy.  Echocardiogram 06/30/11: EF 10-15%, inferior, septal, apical akinesis, mild LVH, severe LAE, mild RAE, moderately reduced RVSF, PASP 52.  He had been followed by pulmonary prior to that.  He had been placed on diuretics.  Medications were titrated with the addition of beta blocker and ARB.  Plan was to follow up for further medication titrations and proceed with right and left heart catheterization to rule out CAD and assess filling pressures once he was on a stable medical regimen.  Overall, he feels well.  He states that he currently feels the best he has felt in 4 months.  He describes class 1-2 dyspnea now.  He denies orthopnea, PND or edema.  He denies chest discomfort.  He denies syncope.  His niece is a cardiologist at teaching institution in [place].  He spoke to her recently and had several questions regarding his current condition.  I tried to answer all of his questions for him today.  Past Medical History  Diagnosis Date  . Chronic systolic heart failure   . HTN (hypertension)   . Hyperlipidemia   . Erectile dysfunction   . Insomnia   . DCM (dilated cardiomyopathy)     Current Outpatient Prescriptions  Medication Sig Dispense Refill  . albuterol (PROVENTIL HFA;VENTOLIN HFA) 108 (90 BASE) MCG/ACT inhaler Inhale 2 puffs into the lungs 3 (three) times daily.        Marland Kitchen amitriptyline (ELAVIL) 100 MG tablet Take 100 mg by mouth at bedtime.        Marland Kitchen aspirin 81 MG tablet Take 81 mg by mouth daily.        . carvedilol  (COREG) 6.25 MG tablet Take 1 tablet (6.25 mg total) by mouth 2 (two) times daily.  60 tablet  11  . furosemide (LASIX) 20 MG tablet Take 1 tablet (20 mg total) by mouth daily.  30 tablet  12  . ketoconazole (NIZORAL) 2 % cream Apply 1 application topically daily.        Marland Kitchen LORazepam (ATIVAN) 0.5 MG tablet Take 0.5 mg by mouth 2 (two) times daily as needed.        Marland Kitchen losartan (COZAAR) 50 MG tablet Take 1 tablet (50 mg total) by mouth daily.  30 tablet  11  . Multiple Vitamins-Minerals (MULTIVITAMIN WITH MINERALS) tablet Take 1 tablet by mouth daily.        . rosuvastatin (CRESTOR) 20 MG tablet Take 20 mg by mouth daily.        Marland Kitchen zolpidem (AMBIEN) 10 MG tablet Take 1 tablet by mouth At bedtime.        Allergies: No Known Allergies  History  Substance Use Topics  . Smoking status: Former Smoker -- 1.5 packs/day for 20 years    Types: Cigarettes    Quit date: 07/21/2001  . Smokeless tobacco: Not on file  . Alcohol Use: No     ROS:  Please see the history of present illness.   All other systems reviewed and negative.  PHYSICAL EXAM: VS:  BP 132/80  Pulse 86  Ht 5\' 11"  (1.803 m)  Wt 229 lb (103.874 kg)  BMI 31.94 kg/m2 Well nourished, well developed, in no acute distress HEENT: normal Neck: no JVD Cardiac:  normal S1, S2; RRR; no murmur Lungs:  clear to auscultation bilaterally, no wheezing, rhonchi or rales Abd: soft, nontender, no hepatomegaly Ext: no edema Skin: warm and dry Neuro:  CNs 2-12 intact, no focal abnormalities noted  EKG:   Sinus rhythm, heart rate 87, left bundle branch block, PVCs  ASSESSMENT AND PLAN:

## 2011-07-18 NOTE — Assessment & Plan Note (Addendum)
Doing well.  Volume is stable.  He has class 1-2 symptoms.  Titrate carvedilol to 12.5 mg twice a day.  Check a basic metabolic panel today.  Plan follow up in 2 weeks with either Dr. Eden Emms or me.  I will touch base with Dr. Eden Emms to see if he would like to proceed with cardiac catheterization prior to that visit.  Risks and benefits of cardiac catheterization have been discussed with the patient.  These include bleeding, infection, kidney damage, stroke, heart attack, death.

## 2011-07-18 NOTE — Assessment & Plan Note (Signed)
Recent lipid panel with his PCP 07/09/11: TC 135, TG 89, LDL 74, HDL 47.

## 2011-07-18 NOTE — Assessment & Plan Note (Signed)
Controlled.  

## 2011-07-18 NOTE — Patient Instructions (Signed)
Your physician has recommended you make the following change in your medication: Increase Carvedilol to 12.5 mg, 1 tablet twice daily.  A new Rx has been sent into your pharmacy for this.  Until your 6.25 runs out, you may take 6.25 mg, 2 tablets twice daily.  Your physician recommends that you schedule a follow-up appointment in: 2 weeks with Dr. Eden Emms  Please have blood work done today:  BMET: 401.1, 428.22

## 2011-07-22 HISTORY — PX: CARDIAC CATHETERIZATION: SHX172

## 2011-07-23 NOTE — Telephone Encounter (Signed)
Spoke with pt and answered his questions.

## 2011-07-30 ENCOUNTER — Telehealth: Payer: Self-pay | Admitting: Cardiovascular Disease

## 2011-07-30 ENCOUNTER — Ambulatory Visit: Payer: BC Managed Care – HMO | Admitting: Cardiovascular Disease

## 2011-07-30 DIAGNOSIS — I10 Essential (primary) hypertension: Secondary | ICD-10-CM

## 2011-07-30 MED ORDER — CARVEDILOL 12.5 MG PO TABS: 12.5000 mg | ORAL_TABLET | Freq: Two times a day (BID) | ORAL | Status: DC

## 2011-07-30 NOTE — Telephone Encounter (Signed)
New Problem:     Patient called in because Louis Juarez increased his dose of his COREG to twice daily but he only received a normal refill and has run out.  He would like to receive a refill of his carvedilol (COREG) 12.5 MG tablet for the proper amount filled with the pharmacy on file.

## 2011-07-30 NOTE — Telephone Encounter (Signed)
Corrected pt script to fill for 30 day supply.  Originally sent in by me for the wrong quantity. Pt called and aware.   Judithe Modest, CMA

## 2011-08-06 ENCOUNTER — Encounter: Payer: Self-pay | Admitting: *Deleted

## 2011-08-06 ENCOUNTER — Encounter: Payer: Self-pay | Admitting: Cardiovascular Disease

## 2011-08-06 ENCOUNTER — Ambulatory Visit: Payer: BC Managed Care – HMO | Admitting: Cardiovascular Disease

## 2011-08-06 ENCOUNTER — Ambulatory Visit (INDEPENDENT_AMBULATORY_CARE_PROVIDER_SITE_OTHER): Payer: BC Managed Care – HMO | Admitting: Cardiovascular Disease

## 2011-08-06 VITALS — BP 123/83 | HR 88 | Ht 71.0 in | Wt 229.0 lb

## 2011-08-06 DIAGNOSIS — I447 Left bundle-branch block, unspecified: Secondary | ICD-10-CM

## 2011-08-06 DIAGNOSIS — Z01810 Encounter for preprocedural cardiovascular examination: Secondary | ICD-10-CM

## 2011-08-06 DIAGNOSIS — E782 Mixed hyperlipidemia: Secondary | ICD-10-CM

## 2011-08-06 DIAGNOSIS — I10 Essential (primary) hypertension: Secondary | ICD-10-CM

## 2011-08-06 DIAGNOSIS — I5022 Chronic systolic (congestive) heart failure: Secondary | ICD-10-CM

## 2011-08-06 LAB — CBC WITH DIFFERENTIAL/PLATELET
Basophils Relative: 0.5 % (ref 0.0–3.0)
Eosinophils Relative: 3.4 % (ref 0.0–5.0)
HCT: 42.1 % (ref 39.0–52.0)
Hemoglobin: 14.2 g/dL (ref 13.0–17.0)
Lymphs Abs: 1.9 10*3/uL (ref 0.7–4.0)
Monocytes Relative: 8.8 % (ref 3.0–12.0)
Neutro Abs: 5.9 10*3/uL (ref 1.4–7.7)
WBC: 8.9 10*3/uL (ref 4.5–10.5)

## 2011-08-06 LAB — BASIC METABOLIC PANEL
Chloride: 102 mEq/L (ref 96–112)
GFR: 80.08 mL/min (ref >60.00)
Potassium: 4 mEq/L (ref 3.5–5.1)
Sodium: 140 mEq/L (ref 135–145)

## 2011-08-06 LAB — PROTIME-INR: Prothrombin Time: 11.5 s (ref 10.2–12.4)

## 2011-08-06 MED ORDER — SODIUM CHLORIDE 0.9 % IJ SOLN: 3.0000 mL | Freq: Two times a day (BID) | INTRAMUSCULAR | Status: DC

## 2011-08-06 MED ORDER — ALBUTEROL SULFATE HFA 108 (90 BASE) MCG/ACT IN AERS: 2.0000 | INHALATION_SPRAY | RESPIRATORY_TRACT | Status: DC | PRN

## 2011-08-06 MED ORDER — SODIUM CHLORIDE 0.9 % IV SOLN: 250.0000 mL | INTRAVENOUS | Status: DC | PRN

## 2011-08-06 MED ORDER — SODIUM CHLORIDE 0.9 % IJ SOLN: 3.0000 mL | INTRAMUSCULAR | Status: DC | PRN

## 2011-08-06 NOTE — Assessment & Plan Note (Signed)
Well controlled.  Continue current medications and low sodium Dash type diet.    

## 2011-08-06 NOTE — Assessment & Plan Note (Signed)
Present 12/12 and 12/28 but no older ECG;s on record.  Likely indicative of DCM.  Fairly wide. If future decrease in functional capacity on good medical regimen would likely be a candidate for biventricular pacing

## 2011-08-06 NOTE — Patient Instructions (Signed)
Your physician recommends that you schedule a follow-up appointment in: AFTER  CATH Your physician recommends that you continue on your current medications as directed. Please refer to the Current Medication list given to you today. Your physician recommends that you return for lab work in: TODAY  BMET CBC PT PTT DX V 72.81 A chest x-ray takes a picture of the organs and structures inside the chest, including the heart, lungs, and blood vessels. This test can show several things, including, whether the heart is enlarges; whether fluid is building up in the lungs; and whether pacemaker / defibrillator leads are still in place. DX V72.81

## 2011-08-06 NOTE — Progress Notes (Signed)
Louis Juarez is a 71 y.o. male who presents for follow up. He recently established with me on 12/12 with newly diagnosed cardiomyopathy. Echocardiogram 06/30/11: EF 10-15%, inferior, septal, apical akinesis, mild LVH, severe LAE, mild RAE, moderately reduced RVSF, PASP 52. He had been followed by pulmonary prior to that. He had been placed on diuretics. Medications were titrated with the addition of beta blocker and ARB. Plan was to follow up for further medication titrations and proceed with right and left heart catheterization to rule out CAD and assess filling pressures once he was on a stable medical regimen.  Overall, he feels well. He states that he currently feels the best he has felt in 4 months. He describes class 1-2 dyspnea now. He denies orthopnea, PND or edema. He denies chest discomfort. He denies syncope. His niece is a cardiologist at teaching institution in [place]. He spoke to her recently and had several questions regarding his current condition. I tried to answer all of his questions for him today. Discussed cath procedure and he understands the risks and benefits and is agreeable to proceed.  Will schedule left and right cath on Friday with Dr McLean with our CHF clinic  ROS: Denies fever, malais, weight loss, blurry vision, decreased visual acuity, cough, sputum, SOB, hemoptysis, pleuritic pain, palpitaitons, heartburn, abdominal pain, melena, lower extremity edema, claudication, or rash.  All other systems reviewed and negative  General: Affect appropriate Healthy:  appears stated age HEENT: normal Neck supple with no adenopathy JVP normal no bruits no thyromegaly Lungs clear with no wheezing and good diaphragmatic motion Heart:  S1/S2 no murmur,rub, gallop or click PMI normal Abdomen: benighn, BS positve, no tenderness, no AAA no bruit.  No HSM or HJR Distal pulses intact with no bruits No edema Neuro non-focal Skin warm and dry No muscular weakness   Current  Outpatient Prescriptions  Medication Sig Dispense Refill  . albuterol (PROAIR HFA) 108 (90 BASE) MCG/ACT inhaler Inhale 2 puffs into the lungs as needed.  1 Inhaler  3  . amitriptyline (ELAVIL) 100 MG tablet Take 100 mg by mouth at bedtime.        . aspirin 81 MG tablet Take 81 mg by mouth daily.        . carvedilol (COREG) 12.5 MG tablet Take 1 tablet (12.5 mg total) by mouth 2 (two) times daily.  60 tablet  3  . furosemide (LASIX) 20 MG tablet Take 1 tablet (20 mg total) by mouth daily.  30 tablet  12  . ketoconazole (NIZORAL) 2 % cream Apply 1 application topically daily.        . LORazepam (ATIVAN) 0.5 MG tablet Take 0.5 mg by mouth 2 (two) times daily as needed.        . losartan (COZAAR) 50 MG tablet Take 1 tablet (50 mg total) by mouth daily.  30 tablet  11  . Multiple Vitamins-Minerals (MULTIVITAMIN WITH MINERALS) tablet Take 1 tablet by mouth daily.        . rosuvastatin (CRESTOR) 20 MG tablet Take 20 mg by mouth daily.        . WELCHOL 3.75 G PACK Take 1 g by mouth daily.       . zolpidem (AMBIEN) 10 MG tablet Take 1 tablet by mouth At bedtime.        Allergies  Review of patient's allergies indicates no known allergies.  Electrocardiogram: 12/28 SR 87 LBBB  Assessment and Plan   

## 2011-08-06 NOTE — Assessment & Plan Note (Signed)
On reasonable meds at this time with improved symtoms.  Right and left cath to R/O CAD given severity of LV dysfuncion and to further guide titration of meds.  CXR and labs today.  Pre cath orders done.  JV lab Friday with Dr Shirlee Latch

## 2011-08-06 NOTE — Assessment & Plan Note (Signed)
Cholesterol is at goal.  Continue current dose of statin and diet Rx.  No myalgias or side effects.  F/U  LFT's in 6 months. No results found for this basename: LDLCALC  Continue statin            

## 2011-08-08 ENCOUNTER — Inpatient Hospital Stay (HOSPITAL_BASED_OUTPATIENT_CLINIC_OR_DEPARTMENT_OTHER): Admit: 2011-08-08 | Payer: Self-pay | Admitting: Cardiovascular Disease

## 2011-08-08 ENCOUNTER — Encounter (HOSPITAL_BASED_OUTPATIENT_CLINIC_OR_DEPARTMENT_OTHER): Payer: Self-pay

## 2011-08-08 SURGERY — JV LEFT AND RIGHT HEART CATHETERIZATION WITH CORONARY ANGIOGRAM
Anesthesia: Moderate Sedation

## 2011-08-11 ENCOUNTER — Encounter (HOSPITAL_BASED_OUTPATIENT_CLINIC_OR_DEPARTMENT_OTHER): Payer: Self-pay | Admitting: *Deleted

## 2011-08-11 ENCOUNTER — Encounter (HOSPITAL_BASED_OUTPATIENT_CLINIC_OR_DEPARTMENT_OTHER)
Admission: RE | Disposition: A | Discharge status: Home / Self Care | Payer: Self-pay | Source: Ambulatory Visit | Attending: Cardiovascular Disease

## 2011-08-11 ENCOUNTER — Inpatient Hospital Stay (HOSPITAL_BASED_OUTPATIENT_CLINIC_OR_DEPARTMENT_OTHER)
Admission: RE | Admit: 2011-08-11 | Discharge: 2011-08-11 | Disposition: A | Discharge status: Home / Self Care | Payer: BC Managed Care – HMO | Source: Ambulatory Visit | Attending: Cardiovascular Disease | Admitting: Cardiovascular Disease

## 2011-08-11 DIAGNOSIS — I2510 Atherosclerotic heart disease of native coronary artery without angina pectoris: Secondary | ICD-10-CM | POA: Insufficient documentation

## 2011-08-11 DIAGNOSIS — I428 Other cardiomyopathies: Secondary | ICD-10-CM | POA: Insufficient documentation

## 2011-08-11 SURGERY — JV LEFT AND RIGHT HEART CATHETERIZATION WITH CORONARY ANGIOGRAM
Anesthesia: Moderate Sedation

## 2011-08-11 MED ORDER — ACETAMINOPHEN 325 MG PO TABS: 650.0000 mg | ORAL_TABLET | ORAL | Status: DC | PRN

## 2011-08-11 MED ORDER — SODIUM CHLORIDE 0.9 % IV SOLN: 1.0000 mL/kg/h | INTRAVENOUS | Status: DC

## 2011-08-11 MED ORDER — ONDANSETRON HCL 4 MG/2ML IJ SOLN: 4.0000 mg | Freq: Four times a day (QID) | INTRAMUSCULAR | Status: DC | PRN

## 2011-08-11 MED ORDER — DIAZEPAM 5 MG PO TABS
5.0000 mg | ORAL_TABLET | Freq: Once | ORAL | Status: AC
  Administered 2011-08-11: 5 mg via ORAL

## 2011-08-11 MED ORDER — SODIUM CHLORIDE 0.9 % IV SOLN: INTRAVENOUS | Status: DC

## 2011-08-11 MED ORDER — FUROSEMIDE 20 MG PO TABS: 40.0000 mg | ORAL_TABLET | Freq: Every day | ORAL | Status: DC

## 2011-08-11 NOTE — Interval H&P Note (Signed)
History and Physical Interval Note:  08/11/2011 7:49 AM  Louis Juarez  has presented today for surgery, with the diagnosis of chest pain  The various methods of treatment have been discussed with the patient and family. After consideration of risks, benefits and other options for treatment, the patient has consented to  Procedure(s): JV LEFT AND RIGHT HEART CATHETERIZATION WITH CORONARY ANGIOGRAM as a surgical intervention .  The patients' history has been reviewed, patient examined, no change in status, stable for surgery.  I have reviewed the patients' chart and labs.  Questions were answered to the patient's satisfaction.     [name]

## 2011-08-11 NOTE — OR Nursing (Signed)
Discharge instructions reviewed and signed, pt stated understanding, ambulated in hall without difficulty, site intact, level 0, transported to wife's car via wheelchair. 

## 2011-08-11 NOTE — H&P (View-Only) (Signed)
Louis Juarez is a 71 y.o. male who presents for follow up. He recently established with me on 12/12 with newly diagnosed cardiomyopathy. Echocardiogram 06/30/11: EF 10-15%, inferior, septal, apical akinesis, mild LVH, severe LAE, mild RAE, moderately reduced RVSF, PASP 52. He had been followed by pulmonary prior to that. He had been placed on diuretics. Medications were titrated with the addition of beta blocker and ARB. Plan was to follow up for further medication titrations and proceed with right and left heart catheterization to rule out CAD and assess filling pressures once he was on a stable medical regimen.  Overall, he feels well. He states that he currently feels the best he has felt in 4 months. He describes class 1-2 dyspnea now. He denies orthopnea, PND or edema. He denies chest discomfort. He denies syncope. His niece is a cardiologist at teaching institution in [place]. He spoke to her recently and had several questions regarding his current condition. I tried to answer all of his questions for him today. Discussed cath procedure and he understands the risks and benefits and is agreeable to proceed.  Will schedule left and right cath on Friday with Dr Shirlee Latch with our CHF clinic  ROS: Denies fever, malais, weight loss, blurry vision, decreased visual acuity, cough, sputum, SOB, hemoptysis, pleuritic pain, palpitaitons, heartburn, abdominal pain, melena, lower extremity edema, claudication, or rash.  All other systems reviewed and negative  General: Affect appropriate Healthy:  appears stated age HEENT: normal Neck supple with no adenopathy JVP normal no bruits no thyromegaly Lungs clear with no wheezing and good diaphragmatic motion Heart:  S1/S2 no murmur,rub, gallop or click PMI normal Abdomen: benighn, BS positve, no tenderness, no AAA no bruit.  No HSM or HJR Distal pulses intact with no bruits No edema Neuro non-focal Skin warm and dry No muscular weakness   Current  Outpatient Prescriptions  Medication Sig Dispense Refill  . albuterol (PROAIR HFA) 108 (90 BASE) MCG/ACT inhaler Inhale 2 puffs into the lungs as needed.  1 Inhaler  3  . amitriptyline (ELAVIL) 100 MG tablet Take 100 mg by mouth at bedtime.        Marland Kitchen aspirin 81 MG tablet Take 81 mg by mouth daily.        . carvedilol (COREG) 12.5 MG tablet Take 1 tablet (12.5 mg total) by mouth 2 (two) times daily.  60 tablet  3  . furosemide (LASIX) 20 MG tablet Take 1 tablet (20 mg total) by mouth daily.  30 tablet  12  . ketoconazole (NIZORAL) 2 % cream Apply 1 application topically daily.        Marland Kitchen LORazepam (ATIVAN) 0.5 MG tablet Take 0.5 mg by mouth 2 (two) times daily as needed.        Marland Kitchen losartan (COZAAR) 50 MG tablet Take 1 tablet (50 mg total) by mouth daily.  30 tablet  11  . Multiple Vitamins-Minerals (MULTIVITAMIN WITH MINERALS) tablet Take 1 tablet by mouth daily.        . rosuvastatin (CRESTOR) 20 MG tablet Take 20 mg by mouth daily.        Lilian Kapur 3.75 G PACK Take 1 g by mouth daily.       Marland Kitchen zolpidem (AMBIEN) 10 MG tablet Take 1 tablet by mouth At bedtime.        Allergies  Review of patient's allergies indicates no known allergies.  Electrocardiogram: 12/28 SR 87 LBBB  Assessment and Plan

## 2011-08-11 NOTE — OR Nursing (Signed)
Bedrest start at 0850am.  Right groin level 0, with 3+ right PT

## 2011-08-11 NOTE — Op Note (Signed)
Cardiac Catheterization Procedure Note  Name: Louis Juarez MRN: [id number] DOB: 01-Jun-1941  Procedure: Right Heart Cath, Left Heart Cath, Selective Coronary Angiography, LV angiography  Indication: Severe cardiomyopathy, LVEF less than 20%   Procedural Details: The right groin was prepped, draped, and anesthetized with 1% lidocaine. Using the modified Seldinger technique a 4 French sheath was placed in the right femoral artery and a 6 French sheath was placed in the right femoral vein. A multipurpose catheter was used for the right heart catheterization. Standard protocol was followed for recording of right heart pressures and sampling of oxygen saturations. Fick cardiac output was calculated. Standard Judkins catheters were used for selective coronary angiography and left ventriculography. There were no immediate procedural complications. The patient was transferred to the post catheterization recovery area for further monitoring.  Procedural Findings: Hemodynamics RA 16 RV 54/24 PA 52/24 with a mean of 38 PCWP 23 LV 104/28 AO 101/61 with a mean of 78  Oxygen saturations: PA 58 AO 94  Cardiac Output (Fick) 4  Cardiac Index (Fick) 1.8   Coronary angiography: Coronary dominance: right  Left mainstem: The left main stem is patent with mild tapering of the distal left main and estimated stenosis of 20-30%.  Left anterior descending (LAD): The LAD is patent throughout its course. There is mild nonobstructive disease in the mid LAD and into the second diagonal branch. There were no high-grade stenoses throughout the course of the LAD. The vessel reaches the left ventricular apex.  Left circumflex (LCx): The left circumflex is patent throughout its course. The ostium of the left circumflex has 30-40% stenosis. The obtuse marginal branches are patent throughout with mild diffuse nonobstructive disease.  Right coronary artery (RCA): The right coronary artery was difficult to engage. It  had a high anterior origin. I was ultimately able to engage the vessel with an a.l. 2 catheter. The proximal RCA had 30-40% stenosis. The distal vessel had a 50% stenosis before the bifurcation into the PDA and posterolateral branches. There were no high-grade stenoses throughout.  Left ventriculography: Left ventricular function is severely depressed. There is severe diffuse hypokinesis. The entire periapical region appears akinetic. The estimated left ventricular ejection fraction is 10-15%.  Final Conclusions:   1. Diffuse nonobstructive coronary artery disease as described above 2. Very severe cardiomyopathy with ejection fraction estimated at about 10% 3. Elevated intracardiac filling pressures  Recommendations: The patient will continue with close outpatient treatment and titration of medical therapy. He will likely need to be considered for advanced therapies such as cardiac resynchronization considering his severe cardiomyopathy   [name] 08/11/2011, 10:25 AM

## 2011-08-11 NOTE — OR Nursing (Signed)
Meal served 

## 2011-08-12 LAB — POCT I-STAT 3, ART BLOOD GAS (G3+)
O2 Saturation: 94 %
pCO2 arterial: 41.4 mmHg (ref 35.0–45.0)
pH, Arterial: 7.373 (ref 7.350–7.450)
pO2, Arterial: 72 mmHg — ABNORMAL LOW (ref 80.0–100.0)

## 2011-08-12 LAB — POCT I-STAT 3, VENOUS BLOOD GAS (G3P V)
Acid-Base Excess: 1 mmol/L (ref 0.0–2.0)
Bicarbonate: 27.9 mEq/L — ABNORMAL HIGH (ref 20.0–24.0)
pH, Ven: 7.356 — ABNORMAL HIGH (ref 7.250–7.300)
pO2, Ven: 32 mmHg (ref 30.0–45.0)

## 2011-08-14 ENCOUNTER — Ambulatory Visit (HOSPITAL_COMMUNITY)
Admission: RE | Admit: 2011-08-14 | Discharge: 2011-08-14 | Disposition: A | Discharge status: Home / Self Care | Payer: BC Managed Care – HMO | Source: Ambulatory Visit | Attending: Internal Medicine | Admitting: Internal Medicine

## 2011-08-14 VITALS — BP 114/74 | HR 74 | Wt 227.2 lb

## 2011-08-14 DIAGNOSIS — I5022 Chronic systolic (congestive) heart failure: Secondary | ICD-10-CM | POA: Insufficient documentation

## 2011-08-14 MED ORDER — DIGOXIN 125 MCG PO TABS: 125.0000 ug | ORAL_TABLET | Freq: Every day | ORAL | Status: DC

## 2011-08-14 MED ORDER — SPIRONOLACTONE 25 MG PO TABS: 12.5000 mg | ORAL_TABLET | Freq: Every day | ORAL | Status: DC

## 2011-08-14 NOTE — Patient Instructions (Signed)
Take Digoxin 0.125 mg daily  Take Spironolactone 12.5 mg daily  Please go to [facility] on  August 22, 2011 to obtain lab work.  Any time except 1:30-2:30  Do the following things EVERYDAY: 1) Weigh yourself in the morning before breakfast. Write it down and keep it in a log. 2) Take your medicines as prescribed 3) Eat low salt foods-Limit salt (sodium) to 2000mg  per day.  4) Stay as active as you can everyday  Follow up in 3-4 weeks

## 2011-08-14 NOTE — Progress Notes (Signed)
[address] [place], [place]  [address] Phone: [phone] Fax:  [phone]  Date:  08/14/2011   Name:  Louis Juarez       DOB:  Feb 06, 1941 MRN:  [id number]  PCP:  Dr. Foy Guadalajara Primary Cardiologist:  Dr. Charlton Haws  Primary Electrophysiologist:  None     History of Present Illness: Louis Juarez is a 71 y.o. male with h/o HTN, obesity, previous tobacco use (mild restriction on PFTs, no obstruction) and LBBB Reffered to HF clinic by Dr. Eden Emms to establish ongoing care of newly diagnosed CHF.  He developed dyspnea/wheezing in summer of 2012. Referred to Dr. Shelle Iron. Mild airway disease.   Echocardiogram 06/30/11: EF 10-15%, inferior, septal, apical akinesis, mild LVH, severe LAE, mild RAE, moderately reduced RVSF, PASP 52.    Cath by Dr. Excell Seltzer. Mild non-obs CAD. LM 20-30%, LAD ok, LCx 30-40% RCA 30-40% prox 50% distal. LVEF 10-15%.   RA 16  RV 54/24  PA 52/24 with a mean of 38  PCWP 23  LV 104/28  AO 101/61 with a mean of 78  Oxygen saturations:  PA 58  AO 94  Cardiac Output (Fick) 4  Cardiac Index (Fick) 1.8  SVR 1240 PVR 3.75 Woods  After cath lasix increased to 20 bid and he feels much better.   Overall, he feels pretty good. Says about 85% back to baseline.  He describes class 1-2 dyspnea now.   Can walk through all the stores without stopping. Can walk up and down 20-25 steps without too much difficulty. He denies orthopnea, PND or edema.  He denies chest discomfort.  Weight down 10 pounds. He denies syncope.  Weighing every day.  He said he used to snore heavily but his wife says it is getting better.  Past Medical History  Diagnosis Date  . Chronic systolic heart failure   . HTN (hypertension)   . Hyperlipidemia   . Erectile dysfunction   . Insomnia   . DCM (dilated cardiomyopathy)     Current Outpatient Prescriptions  Medication Sig Dispense Refill  . albuterol (PROAIR HFA) 108 (90 BASE) MCG/ACT inhaler Inhale 2 puffs into the  lungs as needed.  1 Inhaler  3  . amitriptyline (ELAVIL) 100 MG tablet Take 100 mg by mouth at bedtime.        Marland Kitchen aspirin 81 MG tablet Take 81 mg by mouth daily.        . carvedilol (COREG) 12.5 MG tablet Take 1 tablet (12.5 mg total) by mouth 2 (two) times daily.  60 tablet  3  . furosemide (LASIX) 20 MG tablet Take 2 tablets (40 mg total) by mouth daily.  30 tablet  12  . ketoconazole (NIZORAL) 2 % cream Apply 1 application topically daily.        Marland Kitchen LORazepam (ATIVAN) 0.5 MG tablet Take 0.5 mg by mouth 2 (two) times daily as needed.        Marland Kitchen losartan (COZAAR) 50 MG tablet Take 1 tablet (50 mg total) by mouth daily.  30 tablet  11  . Multiple Vitamins-Minerals (MULTIVITAMIN WITH MINERALS) tablet Take 1 tablet by mouth daily.        . rosuvastatin (CRESTOR) 20 MG tablet Take 20 mg by mouth daily.        Lilian Kapur 3.75 G PACK Take 1 g by mouth daily.       Marland Kitchen zolpidem (AMBIEN) 10 MG tablet Take 1 tablet by mouth At bedtime.  Allergies: No Known Allergies  History  Substance Use Topics  . Smoking status: Former Smoker -- 1.5 packs/day for 20 years    Types: Cigarettes    Quit date: 07/21/2001  . Smokeless tobacco: Not on file  . Alcohol Use: No     ROS:  Please see the history of present illness.   All other systems reviewed and negative.   PHYSICAL EXAM: VS:  BP 114/74  Pulse 74  Wt 227 lb 4 oz (103.08 kg)  SpO2 96% Well nourished, well developed, in no acute distress HEENT: normal Neck: no JVD Cardiac:  PMI laterally displaced. RRR; no murmur. No s3 Lungs:  clear to auscultation bilaterally, no wheezing, rhonchi or rales Abd: soft, obese. nontender, no hepatomegaly Ext: no edema Skin: warm and dry Neuro:  CNs 2-12 intact, no focal abnormalities noted  EKG:   Sinus rhythm, heart rate 87, left bundle branch block, PVCs  ASSESSMENT AND PLAN:

## 2011-08-14 NOTE — Assessment & Plan Note (Addendum)
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 He has had a marked improvement with titration of his medical regimen. Currently NYHA II. Volume status looks great. Will add digoxin 0.125mg  daily and spironolactone 12.5 daily. Check labs next week. Discussed role of HF clinic at length. Reinforced need for daily weights and reviewed use of sliding scale diuretics. Suggested following up with Dr. Shelle Iron for sleep study. Will continue every 3-4 weeks. Will need f/u echo in 3 months. If EF not recovering will need to consider BiVICD.

## 2011-08-15 ENCOUNTER — Telehealth (HOSPITAL_COMMUNITY): Payer: Self-pay | Admitting: *Deleted

## 2011-08-15 MED ORDER — SPIRONOLACTONE 25 MG PO TABS: 12.5000 mg | ORAL_TABLET | Freq: Every day | ORAL | Status: DC

## 2011-08-15 MED ORDER — DIGOXIN 125 MCG PO TABS: 125.0000 ug | ORAL_TABLET | Freq: Every day | ORAL | Status: DC

## 2011-08-15 NOTE — Telephone Encounter (Signed)
Pt aware 10 day supply sent in

## 2011-08-15 NOTE — Progress Notes (Signed)
PT  HAD APPT  ON 08-14-11 WITH DR  BENSIMHON./CY

## 2011-08-15 NOTE — Telephone Encounter (Signed)
Louis Juarez called today.  He will not be getting his meds from his mail order for a few days from now, and Dr Gala Romney wants him to have labs done next week after he has been on the medication for a few days.  He would like to have a 10 day supply called into [facility] in [place] [phone]), so that he can have the lab done and get his meds started as the dr requested.  Please call him back to let him know if this is feasible. Thanks!

## 2011-08-21 ENCOUNTER — Telehealth (HOSPITAL_COMMUNITY): Payer: Self-pay | Admitting: *Deleted

## 2011-08-21 DIAGNOSIS — I10 Essential (primary) hypertension: Secondary | ICD-10-CM

## 2011-08-21 MED ORDER — FUROSEMIDE 20 MG PO TABS: 40.0000 mg | ORAL_TABLET | Freq: Every day | ORAL | Status: DC

## 2011-08-21 MED ORDER — LOSARTAN POTASSIUM 50 MG PO TABS: 50.0000 mg | ORAL_TABLET | Freq: Every day | ORAL | Status: DC

## 2011-08-21 MED ORDER — CARVEDILOL 12.5 MG PO TABS: 12.5000 mg | ORAL_TABLET | Freq: Two times a day (BID) | ORAL | Status: DC

## 2011-08-21 NOTE — Telephone Encounter (Signed)
Spoke w/pt, prescriptions sent in

## 2011-08-21 NOTE — Telephone Encounter (Signed)
Louis Juarez called today regarding his medications.  He has meds with 2 different doctors.  He would like to be able to use [facility] via [facility] for his meds. [phone].  He is very eager to have this change made, he does not like having to go out in public risking getting a cold. Please follow up with him.  Thank you.

## 2011-08-22 ENCOUNTER — Other Ambulatory Visit (INDEPENDENT_AMBULATORY_CARE_PROVIDER_SITE_OTHER): Payer: BC Managed Care – HMO | Admitting: *Deleted

## 2011-08-22 DIAGNOSIS — I5022 Chronic systolic (congestive) heart failure: Secondary | ICD-10-CM

## 2011-08-22 DIAGNOSIS — Z01810 Encounter for preprocedural cardiovascular examination: Secondary | ICD-10-CM

## 2011-08-22 LAB — BASIC METABOLIC PANEL
CO2: 30 mEq/L (ref 19–32)
Chloride: 102 mEq/L (ref 96–112)
Glucose, Bld: 118 mg/dL — ABNORMAL HIGH (ref 70–99)
Sodium: 139 mEq/L (ref 135–145)

## 2011-08-22 LAB — APTT: aPTT: 24.4 s (ref 21.7–28.8)

## 2011-08-27 ENCOUNTER — Ambulatory Visit (INDEPENDENT_AMBULATORY_CARE_PROVIDER_SITE_OTHER): Payer: BC Managed Care – HMO | Admitting: Cardiovascular Disease

## 2011-08-27 ENCOUNTER — Encounter: Payer: Self-pay | Admitting: Cardiovascular Disease

## 2011-08-27 DIAGNOSIS — I5022 Chronic systolic (congestive) heart failure: Secondary | ICD-10-CM

## 2011-08-27 DIAGNOSIS — E782 Mixed hyperlipidemia: Secondary | ICD-10-CM

## 2011-08-27 DIAGNOSIS — I447 Left bundle-branch block, unspecified: Secondary | ICD-10-CM

## 2011-08-27 MED ORDER — DIGOXIN 125 MCG PO TABS: 125.0000 ug | ORAL_TABLET | Freq: Every day | ORAL | Status: DC

## 2011-08-27 MED ORDER — SPIRONOLACTONE 25 MG PO TABS: 12.5000 mg | ORAL_TABLET | Freq: Every day | ORAL | Status: DC

## 2011-08-27 NOTE — Assessment & Plan Note (Signed)
Stable no heart block  May be candidate for biV AICD if EF does not improve

## 2011-08-27 NOTE — Patient Instructions (Signed)
Follow up with Dr Gala Romney at the [facility] as directed.

## 2011-08-27 NOTE — Assessment & Plan Note (Signed)
Improved with functional class one to 2  Still obese.  Discussed sliding scale lasix but I'm not sure he get it.  F/U 3 months and do MRI/Echo to reassess if and need for CRT

## 2011-08-27 NOTE — Progress Notes (Signed)
Louis Juarez is a 71 y.o. male with h/o HTN, obesity, previous tobacco use (mild restriction on PFTs, no obstruction) and LBBB  He developed dyspnea/wheezing in summer of 2012. Referred to Dr. Shelle Iron. Mild airway disease. Echocardiogram 06/30/11: EF 10-15%, inferior, septal, apical akinesis, mild LVH, severe LAE, mild RAE, moderately reduced RVSF, PASP 52.   Cath by Dr. Excell Seltzer. Mild non-obs CAD. LM 20-30%, LAD ok, LCx 30-40% RCA 30-40% prox 50% distal. LVEF 10-15%.  RA 16  RV 54/24  PA 52/24 with a mean of 38  PCWP 23  LV 104/28  AO 101/61 with a mean of 78  Oxygen saturations:  PA 58  AO 94  Cardiac Output (Fick) 4  Cardiac Index (Fick) 1.8  SVR 1240  PVR 3.75 Woods   After cath lasix increased to 20 bid and he feels much better.  Seen in CHF clinic and aldactone and digoxen added Overall, he feels pretty good. Says about 85% back to baseline. He describes class 1-2 dyspnea now. Can walk through all the stores without stopping. Can walk up and down 20-25 steps without too much difficulty. He denies orthopnea, PND or edema. He denies chest discomfort. Weight down 10 pounds. He denies syncope. Weighing every day.  He said he used to snore heavily but his wife says it is getting better.  Dry weight at home 217-221 lbs  ROS: Denies fever, malais, weight loss, blurry vision, decreased visual acuity, cough, sputum, SOB, hemoptysis, pleuritic pain, palpitaitons, heartburn, abdominal pain, melena, lower extremity edema, claudication, or rash.  All other systems reviewed and negative  General: Affect appropriate Obese white male HEENT: normal Neck supple with no adenopathy JVP normal no bruits no thyromegaly Lungs clear with no wheezing and good diaphragmatic motion Heart:  S1/S2 no murmur, no rub, gallop or click PMI normal Abdomen: benighn, BS positve, no tenderness, no AAA no bruit.  No HSM or HJR Distal pulses intact with no bruits No edema Neuro non-focal Skin warm and dry No  muscular weakness   Current Outpatient Prescriptions  Medication Sig Dispense Refill  . albuterol (PROAIR HFA) 108 (90 BASE) MCG/ACT inhaler Inhale 2 puffs into the lungs as needed.  1 Inhaler  3  . amitriptyline (ELAVIL) 100 MG tablet Take 100 mg by mouth at bedtime.        Marland Kitchen aspirin 81 MG tablet Take 81 mg by mouth daily.        . carvedilol (COREG) 12.5 MG tablet Take 1 tablet (12.5 mg total) by mouth 2 (two) times daily.  180 tablet  3  . digoxin (LANOXIN) 0.125 MG tablet Take 1 tablet (125 mcg total) by mouth daily.  90 tablet  3  . furosemide (LASIX) 20 MG tablet Take 20 mg by mouth 2 (two) times daily.      Marland Kitchen ketoconazole (NIZORAL) 2 % cream Apply 1 application topically daily.        Marland Kitchen LORazepam (ATIVAN) 0.5 MG tablet Take 0.5 mg by mouth 2 (two) times daily as needed.        Marland Kitchen losartan (COZAAR) 50 MG tablet Take 1 tablet (50 mg total) by mouth daily.  90 tablet  3  . Multiple Vitamins-Minerals (MULTIVITAMIN WITH MINERALS) tablet Take 1 tablet by mouth daily.        . rosuvastatin (CRESTOR) 20 MG tablet Take 20 mg by mouth daily.        Marland Kitchen spironolactone (ALDACTONE) 25 MG tablet Take 0.5 tablets (12.5 mg total) by mouth daily.  90 tablet  3  . WELCHOL 3.75 G PACK Take 1 g by mouth daily.       Marland Kitchen zolpidem (AMBIEN) 10 MG tablet Take 1 tablet by mouth At bedtime.        Allergies  Review of patient's allergies indicates no known allergies.  Electrocardiogram:  Assessment and Plan

## 2011-08-27 NOTE — Assessment & Plan Note (Signed)
Cholesterol is at goal.  Continue current dose of statin and diet Rx.  No myalgias or side effects.  F/U  LFT's in 6 months. No results found for this basename: LDLCALC             

## 2011-09-04 ENCOUNTER — Ambulatory Visit (HOSPITAL_COMMUNITY)
Admission: RE | Admit: 2011-09-04 | Discharge: 2011-09-04 | Disposition: A | Discharge status: Home / Self Care | Payer: BC Managed Care – HMO | Source: Ambulatory Visit | Attending: Internal Medicine | Admitting: Internal Medicine

## 2011-09-04 VITALS — BP 130/72 | HR 75 | Wt 228.2 lb

## 2011-09-04 DIAGNOSIS — I5022 Chronic systolic (congestive) heart failure: Secondary | ICD-10-CM | POA: Insufficient documentation

## 2011-09-04 DIAGNOSIS — I10 Essential (primary) hypertension: Secondary | ICD-10-CM | POA: Insufficient documentation

## 2011-09-04 MED ORDER — CARVEDILOL 12.5 MG PO TABS: 18.7500 mg | ORAL_TABLET | Freq: Two times a day (BID) | ORAL | Status: DC

## 2011-09-04 NOTE — Progress Notes (Signed)
PCP:  Dr. Foy Guadalajara Primary Cardiologist:  Dr. Charlton Haws  Primary Electrophysiologist:  None     History of Present Illness: Louis Juarez is a 71 y.o. male with h/o HTN, obesity, previous tobacco use (mild restriction on PFTs, no obstruction) and LBBB Referred to HF clinic by Dr. Eden Emms to establish ongoing care of newly diagnosed CHF.  He developed dyspnea/wheezing in summer of 2012. Referred to Dr. Shelle Iron. Mild airway disease.   Echocardiogram 06/30/11: EF 10-15%, inferior, septal, apical akinesis, mild LVH, severe LAE, mild RAE, moderately reduced RVSF, PASP 52.    Cath by Dr. Excell Seltzer. Mild non-obs CAD. LM 20-30%, LAD ok, LCx 30-40% RCA 30-40% prox 50% distal. LVEF 10-15%.   RA 16  RV 54/24  PA 52/24 with a mean of 38  PCWP 23  LV 104/28  AO 101/61 with a mean of 78  Oxygen saturations:  PA 58  AO 94  Cardiac Output (Fick) 4  Cardiac Index (Fick) 1.8  SVR 1240 PVR 3.75 Woods  After cath lasix increased to 20 bid and he feels much better.   Returns for follow up today.  Last night increased rate of breathing with stairs.  Doing all the house work and yard work without difficulty.  He denies orthopnea/PND or edema.  No dizziness or syncope.  No snoring.  Weighing daily.  Starting to have sinus drainage again, has chronic sinus issues.       Past Medical History  Diagnosis Date  . Chronic systolic heart failure   . HTN (hypertension)   . Hyperlipidemia   . Erectile dysfunction   . Insomnia   . DCM (dilated cardiomyopathy)     Current Outpatient Prescriptions  Medication Sig Dispense Refill  . albuterol (PROAIR HFA) 108 (90 BASE) MCG/ACT inhaler Inhale 2 puffs into the lungs as needed.  1 Inhaler  3  . amitriptyline (ELAVIL) 100 MG tablet Take 100 mg by mouth at bedtime.        Marland Kitchen aspirin 81 MG tablet Take 81 mg by mouth daily.        . carvedilol (COREG) 12.5 MG tablet Take 1 tablet (12.5 mg total) by mouth 2 (two) times daily.  180 tablet  3  . digoxin (LANOXIN) 0.125 MG  tablet Take 1 tablet (125 mcg total) by mouth daily.  90 tablet  3  . furosemide (LASIX) 20 MG tablet Take 20 mg by mouth 2 (two) times daily.      Marland Kitchen ketoconazole (NIZORAL) 2 % cream Apply 1 application topically daily.        Marland Kitchen LORazepam (ATIVAN) 0.5 MG tablet Take 0.5 mg by mouth 2 (two) times daily as needed.        Marland Kitchen losartan (COZAAR) 50 MG tablet Take 1 tablet (50 mg total) by mouth daily.  90 tablet  3  . Multiple Vitamins-Minerals (MULTIVITAMIN WITH MINERALS) tablet Take 1 tablet by mouth daily.        . rosuvastatin (CRESTOR) 20 MG tablet Take 20 mg by mouth daily.        Marland Kitchen spironolactone (ALDACTONE) 25 MG tablet Take 0.5 tablets (12.5 mg total) by mouth daily.  90 tablet  3  . WELCHOL 3.75 G PACK Take 1 g by mouth daily.       Marland Kitchen zolpidem (AMBIEN) 10 MG tablet Take 1 tablet by mouth At bedtime.        Allergies: No Known Allergies  History  Substance Use Topics  . Smoking status: Former Smoker --  1.5 packs/day for 20 years    Types: Cigarettes    Quit date: 07/21/2001  . Smokeless tobacco: Not on file  . Alcohol Use: No     ROS:  Please see the history of present illness.   All other systems reviewed and negative.   PHYSICAL EXAM: Filed Vitals:   09/04/11 0857  BP: 130/72  Pulse: 75  Weight: 228 lb 4 oz (103.534 kg)  SpO2: 95%    Well nourished, well developed, in no acute distress HEENT: normal Neck: no JVD Cardiac:  PMI laterally displaced. RRR; no murmur. No s3 Lungs:  clear to auscultation bilaterally, no wheezing, rhonchi or rales Abd: soft, obese. nontender, no hepatomegaly Ext: no edema Skin: warm and dry Neuro:  CNs 2-12 intact, no focal abnormalities noted   ASSESSMENT AND PLAN:

## 2011-09-04 NOTE — Assessment & Plan Note (Addendum)
NYHA II-III.  Volume status looks great today.  Will increase cooreg to 18.75 mg BID.  Discussed sliding scale lasix.  Hopefully EF will improve, recheck in several months.    Patient seen and examined with Ulyess Blossom PA-C. We discussed all aspects of the encounter. I agree with the assessment and plan as stated above.  He is dramatically improved from a functional capacity. Volume status looks good. Will increase cardvedilol. Reinforced need for daily weights and reviewed use of sliding scale diuretics.We provided him weight charts in the clinic. Will be due for repeat echo in next few months. If EF not improving, will need to consider CRT-D given wide LBBB. We will continue to follow him closely in the HF clinic.

## 2011-09-04 NOTE — Patient Instructions (Signed)
Increase carvedilol 18.75 mg (1.5 tabs) twice daily.    Follow up with Dr. Gala Romney 3-4 weeks.   Do the following things EVERYDAY: 1) Weigh yourself in the morning before breakfast. Write it down and keep it in a log. 2) Take your medicines as prescribed 3) Eat low salt foods--Limit salt (sodium) to 2000mg  per day.  4) Stay as active as you can everyday

## 2011-10-07 ENCOUNTER — Ambulatory Visit (HOSPITAL_COMMUNITY)
Admission: RE | Admit: 2011-10-07 | Discharge: 2011-10-07 | Disposition: A | Discharge status: Home / Self Care | Payer: BC Managed Care – HMO | Source: Ambulatory Visit | Attending: Internal Medicine | Admitting: Internal Medicine

## 2011-10-07 ENCOUNTER — Other Ambulatory Visit: Payer: Self-pay

## 2011-10-07 DIAGNOSIS — I10 Essential (primary) hypertension: Secondary | ICD-10-CM

## 2011-10-07 DIAGNOSIS — I5022 Chronic systolic (congestive) heart failure: Secondary | ICD-10-CM | POA: Insufficient documentation

## 2011-10-07 DIAGNOSIS — R079 Chest pain, unspecified: Secondary | ICD-10-CM | POA: Insufficient documentation

## 2011-10-07 MED ORDER — CARVEDILOL 25 MG PO TABS: 25.0000 mg | ORAL_TABLET | Freq: Two times a day (BID) | ORAL | Status: DC

## 2011-10-07 NOTE — Patient Instructions (Addendum)
Take Carvedilol 25 mg twice a day  Follow up in 4 weeks with ECHO  Please call if Chest Pain persists.   Do the following things EVERYDAY: 1) Weigh yourself in the morning before breakfast. Write it down and keep it in a log. 2) Take your medicines as prescribed 3) Eat low salt foods--Limit salt (sodium) to 2000mg  per day.  4) Stay as active as you can everyday

## 2011-10-07 NOTE — Assessment & Plan Note (Addendum)
Chest pain atypical. Coronaries OK on recent cath.  Instructed to call if chest pain persists, gets worse, or if he develops sweating while chest pain occurs.

## 2011-10-07 NOTE — Progress Notes (Signed)
Patient ID: Louis Juarez, male   DOB: 07-May-1941, 71 y.o.   MRN: [id number] PCP:  Dr. Foy Guadalajara Primary Cardiologist:  Dr. Charlton Haws  Primary Electrophysiologist:  None     History of Present Illness: Louis Juarez is a 71 y.o. male with h/o HTN, obesity, previous tobacco use (mild restriction on PFTs, no obstruction) and LBBB.  Referred to HF clinic by Dr. Eden Emms to establish ongoing care of newly diagnosed CHF.  Echocardiogram 06/30/11: EF 10-15%, inferior, septal, apical akinesis, mild LVH, severe LAE, mild RAE, moderately reduced RVSF, PASP 52.    RHC/LHC. Mild non-obs CAD. LM 20-30%, LAD ok, LCx 30-40% RCA 30-40% prox 50% distal. LVEF 10-15%.  RA 16  RV 54/24  PA 52/24 with a mean of 38  PCWP 23  LV 104/28  AO 101/61 with a mean of 78  Oxygen saturations:  PA 58  AO 94  Cardiac Output (Fick) 4  Cardiac Index (Fick) 1.8  SVR 1240 PVR 3.75 Woods  Last visit Coreg increased to 18.75 mg twice a day.   He returns for follow up.  He did multiple hours of yard work Saturday. He had dull chest pain on left side of chest for couple hours over the last 2 nights.   Denies SOB/PND/Orhtopnea/dizziness. Occasional SOB going up stairs. Weight at home 226-227. He has not required any extra Lasix.     Past Medical History  Diagnosis Date  . Chronic systolic heart failure   . HTN (hypertension)   . Hyperlipidemia   . Erectile dysfunction   . Insomnia   . DCM (dilated cardiomyopathy)     Current Outpatient Prescriptions  Medication Sig Dispense Refill  . albuterol (PROAIR HFA) 108 (90 BASE) MCG/ACT inhaler Inhale 2 puffs into the lungs as needed.  1 Inhaler  3  . amitriptyline (ELAVIL) 100 MG tablet Take 100 mg by mouth at bedtime.        Marland Kitchen aspirin 81 MG tablet Take 81 mg by mouth daily.        . carvedilol (COREG) 12.5 MG tablet Take 1.5 tablets (18.75 mg total) by mouth 2 (two) times daily.  180 tablet  3  . digoxin (LANOXIN) 0.125 MG tablet Take 1 tablet (125 mcg total) by mouth  daily.  90 tablet  3  . furosemide (LASIX) 20 MG tablet Take 20 mg by mouth 2 (two) times daily.      Marland Kitchen ketoconazole (NIZORAL) 2 % cream Apply 1 application topically daily.        Marland Kitchen LORazepam (ATIVAN) 0.5 MG tablet Take 0.5 mg by mouth 2 (two) times daily as needed.        Marland Kitchen losartan (COZAAR) 50 MG tablet Take 1 tablet (50 mg total) by mouth daily.  90 tablet  3  . Multiple Vitamins-Minerals (MULTIVITAMIN WITH MINERALS) tablet Take 1 tablet by mouth daily.        . rosuvastatin (CRESTOR) 20 MG tablet Take 20 mg by mouth daily.        Marland Kitchen spironolactone (ALDACTONE) 25 MG tablet Take 0.5 tablets (12.5 mg total) by mouth daily.  90 tablet  3  . WELCHOL 3.75 G PACK Take 1 g by mouth daily.       Marland Kitchen zolpidem (AMBIEN) 10 MG tablet Take 1 tablet by mouth At bedtime.        Allergies: No Known Allergies  History  Substance Use Topics  . Smoking status: Former Smoker -- 1.5 packs/day for 20 years    Types:  Cigarettes    Quit date: 07/21/2001  . Smokeless tobacco: Not on file  . Alcohol Use: No     ROS:  Please see the history of present illness.   All other systems reviewed and negative.   PHYSICAL EXAM: Filed Vitals:   10/07/11 0852  BP: 106/58  Pulse: 68  Weight: 233 lb 8 oz (105.915 kg)  SpO2: 96%   233 (228) Well nourished, well developed, in no acute distress HEENT: normal Neck: no JVD Cardiac:  PMI laterally displaced. RRR; no murmur. No s3 Lungs:  clear to auscultation bilaterally, no wheezing, rhonchi or rales Abd: soft, obese. nontender, no hepatomegaly Ext: no edema Skin: warm and dry Neuro:  CNs 2-12 intact, no focal abnormalities noted  EKG: SR 75 LBBBB ASSESSMENT AND PLAN:

## 2011-10-07 NOTE — Assessment & Plan Note (Addendum)
NYHA II. Volume status stable. Increase Coreg 25 mg twice a day. Will repeat ECHO in one month during follow up. If EF remains low will refer for CRT-D.  Patient seen and examined with Tonye Becket, NP. We discussed all aspects of the encounter. I agree with the assessment and plan as stated above. He is doing very well. Volume status stable on exam. Reinforced need for daily weights and reviewed use of sliding scale diuretics. Needs f/u echo to assess for LV recovery.

## 2011-11-03 ENCOUNTER — Ambulatory Visit (HOSPITAL_COMMUNITY)
Admission: RE | Admit: 2011-11-03 | Discharge: 2011-11-03 | Disposition: A | Discharge status: Home / Self Care | Payer: BC Managed Care – HMO | Source: Ambulatory Visit | Attending: Adult Health | Admitting: Adult Health

## 2011-11-03 ENCOUNTER — Ambulatory Visit (HOSPITAL_COMMUNITY)
Admission: RE | Admit: 2011-11-03 | Discharge: 2011-11-03 | Disposition: A | Discharge status: Home / Self Care | Payer: BC Managed Care – HMO | Source: Ambulatory Visit | Attending: Internal Medicine | Admitting: Internal Medicine

## 2011-11-03 VITALS — BP 116/76 | HR 77 | Wt 235.5 lb

## 2011-11-03 DIAGNOSIS — I252 Old myocardial infarction: Secondary | ICD-10-CM | POA: Insufficient documentation

## 2011-11-03 DIAGNOSIS — I5022 Chronic systolic (congestive) heart failure: Secondary | ICD-10-CM

## 2011-11-03 DIAGNOSIS — I059 Rheumatic mitral valve disease, unspecified: Secondary | ICD-10-CM

## 2011-11-03 DIAGNOSIS — I509 Heart failure, unspecified: Secondary | ICD-10-CM | POA: Insufficient documentation

## 2011-11-03 NOTE — Progress Notes (Signed)
Patient ID: Louis Juarez, male   DOB: 05-24-1941, 71 y.o.   MRN: [id number] PCP:  Dr. Foy Guadalajara Primary Cardiologist:  Dr. Charlton Haws  Primary Electrophysiologist:  None     History of Present Illness: Louis Juarez is a 71 y.o. male with h/o HTN, obesity, previous tobacco use (mild restriction on PFTs, no obstruction) and LBBB Referred to HF clinic by Dr. Eden Emms to establish ongoing care of newly diagnosed CHF.  He developed dyspnea/wheezing in summer of 2012. Referred to Dr. Shelle Iron. Mild airway disease.   Echocardiogram 06/30/11: EF 10-15%, inferior, septal, apical akinesis, mild LVH, severe LAE, mild RAE, moderately reduced RVSF, PASP 52.    Cath by Dr. Excell Seltzer. Mild non-obs CAD. LM 20-30%, LAD ok, LCx 30-40% RCA 30-40% prox 50% distal. LVEF 10-15%.   RA 16  RV 54/24  PA 52/24 with a mean of 38  PCWP 23  LV 104/28  AO 101/61 with a mean of 78  Oxygen saturations:  PA 58  AO 94  Cardiac Output (Fick) 4  Cardiac Index (Fick) 1.8  SVR 1240 PVR 3.75 [employer] for follow up today. Last visit Carvedilol increased 25 mg twice a day and he has tolerated this well. Feels great.  Denies SOB/PND/Orthpnea. Weight at home 227-230 pounds. Continues to be active in the yard. Denies lower extremity edema. Complaint with medications.       Past Medical History  Diagnosis Date  . Chronic systolic heart failure   . HTN (hypertension)   . Hyperlipidemia   . Erectile dysfunction   . Insomnia   . DCM (dilated cardiomyopathy)     Current Outpatient Prescriptions  Medication Sig Dispense Refill  . amitriptyline (ELAVIL) 100 MG tablet Take 100 mg by mouth at bedtime.        Marland Kitchen aspirin 81 MG tablet Take 81 mg by mouth daily.        . carvedilol (COREG) 25 MG tablet Take 1 tablet (25 mg total) by mouth 2 (two) times daily.  60 tablet  6  . digoxin (LANOXIN) 0.125 MG tablet Take 1 tablet (125 mcg total) by mouth daily.  90 tablet  3  . furosemide (LASIX) 20 MG tablet Take 20 mg by mouth 2 (two)  times daily.      Marland Kitchen LORazepam (ATIVAN) 0.5 MG tablet Take 0.5 mg by mouth 4 (four) times daily.       Marland Kitchen losartan (COZAAR) 50 MG tablet Take 1 tablet (50 mg total) by mouth daily.  90 tablet  3  . Multiple Vitamins-Minerals (MULTIVITAMIN WITH MINERALS) tablet Take 1 tablet by mouth daily.        . rosuvastatin (CRESTOR) 40 MG tablet Take 20 mg by mouth daily.       Marland Kitchen spironolactone (ALDACTONE) 25 MG tablet Take 25 mg by mouth daily.      Lilian Kapur 3.75 G PACK Take 1 g by mouth daily.       Marland Kitchen zolpidem (AMBIEN) 10 MG tablet Take 1 tablet by mouth At bedtime.      Marland Kitchen DISCONTD: albuterol (PROAIR HFA) 108 (90 BASE) MCG/ACT inhaler Inhale 2 puffs into the lungs as needed.  1 Inhaler  3    Allergies: No Known Allergies  History  Substance Use Topics  . Smoking status: Former Smoker -- 1.5 packs/day for 20 years    Types: Cigarettes    Quit date: 07/21/2001  . Smokeless tobacco: Not on file  . Alcohol Use: No     ROS:  Please see the history of present illness.   All other systems reviewed and negative.   PHYSICAL EXAM: Filed Vitals:   11/03/11 0951  BP: 116/76  Pulse: 77  Weight: 235 lb 8 oz (106.822 kg)  SpO2: 96%   Weight 235 (233 pounds) Well nourished, well developed, in no acute distress HEENT: normal Neck: no JVD Cardiac:  PMI laterally displaced. RRR; no murmur. No s3 Lungs:  clear to auscultation bilaterally, no wheezing, rhonchi or rales Abd: soft, obese. nontender, no hepatomegaly Ext: no edema Skin: warm and dry Neuro:  CNs 2-12 intact, no focal abnormalities noted   ASSESSMENT AND PLAN:

## 2011-11-03 NOTE — Progress Notes (Signed)
  Echocardiogram 2D Echocardiogram has been performed.  Louis Juarez L 11/03/2011, 9:13 AM

## 2011-11-03 NOTE — Assessment & Plan Note (Addendum)
Continues to do well. NYHA I with no functional limitations.  ECHO results reviewed.EF 20% with minimal improvement on optimal medication therapy. Discussed with Dr Graciela Husbands and he has recommended CPX test. Will schedule CPX for this week. After CPX completed will consider ICD based the results. Will increase Losartan to 100 mg daily after CPX.  Volume status stable. Discussed daily weights, medication compliance, and low salt diet.   Patient seen and examined with Tonye Becket, NP. We discussed all aspects of the encounter. I agree with the assessment and plan as stated above.  He continues to do very well but I remain skeptical that he is actually NYHA Class I given his severe LV dysfunction and wide LBBB. I have discussed with Dr. Graciela Husbands at length and we agree that CPX testing to objectively measure his functional capacity will be useful in helping to determine whether he is candidate for ICD +/- CRT or not. We explained this to Mr. Harkins at length. He remains hesitant about proceeding with ICD at this point.

## 2011-11-03 NOTE — Patient Instructions (Addendum)
CPX this week  Do the following things EVERYDAY: 1) Weigh yourself in the morning before breakfast. Write it down and keep it in a log. 2) Take your medicines as prescribed 3) Eat low salt foods--Limit salt (sodium) to 2000mg  per day.  4) Stay as active as you can everyday

## 2011-11-04 ENCOUNTER — Telehealth (HOSPITAL_COMMUNITY): Payer: Self-pay | Admitting: *Deleted

## 2011-11-04 NOTE — Telephone Encounter (Signed)
Mr Forgette called today, he would like a little bit more information regarding his CPX that was scheduled for him.  Please call him back. Thanks.

## 2011-11-05 ENCOUNTER — Ambulatory Visit (HOSPITAL_COMMUNITY): Payer: BC Managed Care – HMO | Attending: Adult Health

## 2011-11-05 DIAGNOSIS — R0989 Other specified symptoms and signs involving the circulatory and respiratory systems: Secondary | ICD-10-CM

## 2011-11-05 DIAGNOSIS — R0609 Other forms of dyspnea: Secondary | ICD-10-CM

## 2011-11-05 DIAGNOSIS — I5022 Chronic systolic (congestive) heart failure: Secondary | ICD-10-CM

## 2011-11-05 DIAGNOSIS — I509 Heart failure, unspecified: Secondary | ICD-10-CM | POA: Insufficient documentation

## 2011-11-05 NOTE — Telephone Encounter (Signed)
Please return call.

## 2011-11-06 NOTE — Telephone Encounter (Signed)
Pt had CPX 4/17

## 2011-11-10 ENCOUNTER — Telehealth (HOSPITAL_COMMUNITY): Payer: Self-pay | Admitting: *Deleted

## 2011-11-10 NOTE — Telephone Encounter (Signed)
Louis Juarez called.  He is wondering about the results of his CPX and is also wanting to know when his next appt should be.  He would like a call back.  Thanks.

## 2011-11-12 NOTE — Telephone Encounter (Signed)
Per Dr Gala Romney CPX looks OK still unsure if he will need ICD, pt is aware he states he is going on vacation in 3 weeks and Dr Gala Romney was suppose to let him know if that would be ok or if he needs to get defib first, scheduled appt for tomorrow at 1:30 to discuss w/Dr Bensimhon

## 2011-11-13 ENCOUNTER — Ambulatory Visit (HOSPITAL_COMMUNITY)
Admission: RE | Admit: 2011-11-13 | Discharge: 2011-11-13 | Disposition: A | Discharge status: Home / Self Care | Payer: BC Managed Care – HMO | Source: Ambulatory Visit | Attending: Internal Medicine | Admitting: Internal Medicine

## 2011-11-13 VITALS — BP 124/62 | HR 83 | Wt 232.5 lb

## 2011-11-13 DIAGNOSIS — I5022 Chronic systolic (congestive) heart failure: Secondary | ICD-10-CM | POA: Insufficient documentation

## 2011-11-13 MED ORDER — LOSARTAN POTASSIUM 100 MG PO TABS: 100.0000 mg | ORAL_TABLET | Freq: Every day | ORAL | Status: DC

## 2011-11-13 NOTE — Progress Notes (Signed)
Encounter addended by: Noralee Space, RN on: 11/13/2011  2:26 PM<BR>     Documentation filed: Patient Instructions Section, Orders

## 2011-11-13 NOTE — Patient Instructions (Signed)
Increase Losartan to 100 mg daily  Your physician recommends that you schedule a follow-up appointment in: 6 weeks

## 2011-11-13 NOTE — Progress Notes (Signed)
Patient ID: Louis Juarez, male   DOB: March 09, 1941, 71 y.o.   MRN: [id number]  PCP:  Dr. Foy Guadalajara Primary Cardiologist:  Dr. Charlton Haws  Primary Electrophysiologist:  None     History of Present Illness: Louis Juarez is a 71 y.o. male with h/o HTN, obesity, previous tobacco use (mild restriction on PFTs, no obstruction) and LBBB Referred to HF clinic by Dr. Eden Emms to establish ongoing care of newly diagnosed CHF.  He developed dyspnea/wheezing in summer of 2012. Referred to Dr. Shelle Iron. Mild airway disease.   Echocardiogram 06/30/11: EF 10-15%, inferior, septal, apical akinesis, mild LVH, severe LAE, mild RAE, moderately reduced RVSF, PASP 52.    Cath by Dr. Excell Seltzer. Mild non-obs CAD. LM 20-30%, LAD ok, LCx 30-40% RCA 30-40% prox 50% distal. LVEF 10-15%.   RA 16  RV 54/24  PA 52/24 with a mean of 38  PCWP 23  LV 104/28  AO 101/61 with a mean of 78  Oxygen saturations:  PA 58  AO 94  Cardiac Output (Fick) 4  Cardiac Index (Fick) 1.8  SVR 1240 PVR 3.75 Woods  F/u echo: 11/03/2011 EF 25-30%. At last visit we discussed ICD but we debated the possibility of him being Class I vs Class II.  Had CPX test:  4/13: Spirometry: normal Peak VO2: 17.7 ml/kg/min predicted peak VO2: 86.5% (corrected to ideal weight VO2 37ml/kg/min) VE/VCO2 slope: 31.1 OUES: 2.11 Peak RER: 1.20 Ventilatory Threshold: 12.0 % predicted peak VO2: 58.7% VE/MVV: 64.9% PETCO2 at peak: 35 O2pulse: 15 % predicted O2pulse: 100%   Returns for follow up today. Continues to be quite active and says he feels quite well and can do most of the things he wants to do. However, on some days he says he gets SOB just walking across the room. Compliant with all meds. Denies SOB/PND/Orthpnea/edema. Weight at home 227-230 pounds.     Past Medical History  Diagnosis Date  . Chronic systolic heart failure   . HTN (hypertension)   . Hyperlipidemia   . Erectile dysfunction   . Insomnia   . DCM (dilated cardiomyopathy)      Current Outpatient Prescriptions  Medication Sig Dispense Refill  . amitriptyline (ELAVIL) 100 MG tablet Take 100 mg by mouth at bedtime.        Marland Kitchen aspirin 81 MG tablet Take 81 mg by mouth daily.        . carvedilol (COREG) 25 MG tablet Take 1 tablet (25 mg total) by mouth 2 (two) times daily.  60 tablet  6  . digoxin (LANOXIN) 0.125 MG tablet Take 1 tablet (125 mcg total) by mouth daily.  90 tablet  3  . furosemide (LASIX) 20 MG tablet Take 20 mg by mouth 2 (two) times daily.      Marland Kitchen LORazepam (ATIVAN) 0.5 MG tablet Take 0.5 mg by mouth 4 (four) times daily.       Marland Kitchen losartan (COZAAR) 50 MG tablet Take 1 tablet (50 mg total) by mouth daily.  90 tablet  3  . Multiple Vitamins-Minerals (MULTIVITAMIN WITH MINERALS) tablet Take 1 tablet by mouth daily.        . rosuvastatin (CRESTOR) 40 MG tablet Take 20 mg by mouth daily.       Marland Kitchen spironolactone (ALDACTONE) 25 MG tablet Take 25 mg by mouth daily.      Lilian Kapur 3.75 G PACK Take 1 g by mouth daily.       Marland Kitchen zolpidem (AMBIEN) 10 MG tablet Take 1 tablet by mouth  At bedtime.      Marland Kitchen DISCONTD: albuterol (PROAIR HFA) 108 (90 BASE) MCG/ACT inhaler Inhale 2 puffs into the lungs as needed.  1 Inhaler  3    Allergies: No Known Allergies  History  Substance Use Topics  . Smoking status: Former Smoker -- 1.5 packs/day for 20 years    Types: Cigarettes    Quit date: 07/21/2001  . Smokeless tobacco: Not on file  . Alcohol Use: No     ROS:  Please see the history of present illness.   All other systems reviewed and negative.   PHYSICAL EXAM: Filed Vitals:   11/13/11 1342  BP: 124/62  Pulse: 83  Weight: 232 lb 8 oz (105.461 kg)  SpO2: 94%   Weight 233 (235 pounds) Well nourished, well developed, in no acute distress HEENT: normal Neck: no JVD Cardiac:  PMI laterally displaced. RRR; no murmur. No s3 Lungs:  clear to auscultation bilaterally, no wheezing, rhonchi or rales Abd: soft, obese. nontender, no hepatomegaly Ext: no edema Skin:  warm and dry Neuro:  CNs 2-12 intact, no focal abnormalities noted   ASSESSMENT AND PLAN:

## 2011-11-13 NOTE — Assessment & Plan Note (Addendum)
Overall doing fairly well. Based on his symptoms and CPX, I feel like he is NYHA Class II. With EF 25-30% and wide LBBB would recommend BiVICD. We reviewed CPX test in detail and had a long discussion about role of ICDs and he wants to proceed. Will titrate losartan 100 daily. Will refer to Dr. Graciela Husbands.

## 2011-11-19 ENCOUNTER — Encounter: Payer: Self-pay | Admitting: Internal Medicine

## 2011-11-19 ENCOUNTER — Ambulatory Visit (INDEPENDENT_AMBULATORY_CARE_PROVIDER_SITE_OTHER): Payer: BC Managed Care – HMO | Admitting: Internal Medicine

## 2011-11-19 VITALS — BP 128/76 | HR 72 | Ht 71.0 in | Wt 234.0 lb

## 2011-11-19 DIAGNOSIS — I5022 Chronic systolic (congestive) heart failure: Secondary | ICD-10-CM

## 2011-11-19 DIAGNOSIS — I447 Left bundle-branch block, unspecified: Secondary | ICD-10-CM

## 2011-11-19 DIAGNOSIS — I428 Other cardiomyopathies: Secondary | ICD-10-CM

## 2011-11-19 NOTE — Assessment & Plan Note (Signed)
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 He has class II symptoms at worst. I will discuss with Dr. Dorthea Cove functional status and plan for ongoing therapy. He currently meets guidelines for ICD implantation for primary prevention with class II symptoms and nonischemic cardiomyopathy. With his left bundle branch block, especially as broad as it is, he would also be a candidate for synchronization therapy. I have reviewed with him and his wife extensively the physiology of left bundle branch block and the role of resynchronization and potential benefits of ICD implantation.

## 2011-11-19 NOTE — Assessment & Plan Note (Signed)
Currently quite well compensated. We'll continue current medications

## 2011-11-19 NOTE — Progress Notes (Signed)
History and Physical  Patient ID: Louis Juarez MRN: [id number], SOB: February 02, 1941 71 y.o. Date of Encounter: 11/19/2011, 12:38 PM  Primary Physician: Cam Hai, CNM, CNM Primary Cardiologist: PN    Chief Complaint: Consideration of ICD implantation  History of Present Illness: Louis Juarez is a 71 y.o. male referred from the heart failure clinic and Dr. Jamse Mead for consideration of ICD implantation.  He presented last fall to cardiology with a six-month history of dyspnea that had been attributed to bronchitis/sinus infections/reactive airways disease.  He was found ultimately to have nonobstructive coronary disease demonstrated by catheterization ejection fraction 2013;  at that time was 10-15%. This   confirmed  echocardiogram December 2012. There is accompanied severe biatrial enlargement and significant wall motion abnormalities. He was also found to have left bundle branch block.; But this turned out to be old having been identified about 10 or 12 years ago prompted catheterization that was normal  He has been followed in the heart failure clinic. He is felt to be class II. By my history he is scarcely limited. His ejection fraction is improved from 10-15>> 25-30 over the last 4 months.  He denies palpitations, syncope, edema, nocturnal dyspnea or orthopnea.  He has been well treated on guidelines directed therapy concurrent with the symptomatic and functional improvement noted above.   Past Medical History  Diagnosis Date  . Chronic systolic heart failure   . HTN (hypertension)   . Hyperlipidemia   . Erectile dysfunction   . Insomnia   . DCM (dilated cardiomyopathy)      Past Surgical History  Procedure Date  . Nasal sinus surgery 93, 97, 2010, 2006      Current Outpatient Prescriptions  Medication Sig Dispense Refill  . amitriptyline (ELAVIL) 100 MG tablet Take 100 mg by mouth at bedtime.        Marland Kitchen aspirin 81 MG tablet Take 81 mg by mouth daily.        . carvedilol  (COREG) 25 MG tablet Take 1 tablet (25 mg total) by mouth 2 (two) times daily.  60 tablet  6  . digoxin (LANOXIN) 0.125 MG tablet Take 1 tablet (125 mcg total) by mouth daily.  90 tablet  3  . furosemide (LASIX) 20 MG tablet Take 20 mg by mouth 2 (two) times daily.      Marland Kitchen LORazepam (ATIVAN) 0.5 MG tablet Take 1 mg by mouth 2 (two) times daily. 2 tabs in AM, 2 tabs in PM      . losartan (COZAAR) 100 MG tablet Take 1 tablet (100 mg total) by mouth daily.  90 tablet  3  . Multiple Vitamins-Minerals (MULTIVITAMIN WITH MINERALS) tablet Take 1 tablet by mouth daily.        . rosuvastatin (CRESTOR) 40 MG tablet Take 20 mg by mouth daily.       Marland Kitchen spironolactone (ALDACTONE) 25 MG tablet Take 25 mg by mouth daily.      Lilian Kapur 3.75 G PACK Take 3.75 g by mouth daily.       Marland Kitchen zolpidem (AMBIEN) 10 MG tablet Take 1 tablet by mouth At bedtime.      Marland Kitchen DISCONTD: albuterol (PROAIR HFA) 108 (90 BASE) MCG/ACT inhaler Inhale 2 puffs into the lungs as needed.  1 Inhaler  3     Allergies: No Known Allergies   History  Substance Use Topics  . Smoking status: Former Smoker -- 1.5 packs/day for 20 years    Types: Cigarettes    Quit  date: 07/21/2001  . Smokeless tobacco: Not on file  . Alcohol Use: No      Family History  Problem Relation Age of Onset  . Colon cancer Father   . Melanoma Mother   . Melanoma Sister       ROS:  Please see the history of present illness.     All other systems reviewed and negative.   Vital Signs: Blood pressure 128/76, pulse 72, height 5\' 11"  (1.803 m), weight 234 lb (106.142 kg), SpO2 95.00%.  PHYSICAL EXAM: General:  Well nourished, well developed male in no acute distress HEENT: normal Lymph: no adenopathy Neck: no JVD Endocrine:  No thryomegaly Vascular: No carotid bruits; FA pulses 2+ bilaterally without bruits Cardiac:  normal S1, S2; RRR; no murmur PMI laterally displace Back: without kyphosis/scoliosis, no CVA tenderness Lungs:  clear to auscultation  bilaterally, no wheezing, rhonchi or rales Abd: soft, nontender, no hepatomegaly Ext: no edema Musculoskeletal:  No deformities, BUE and BLE strength normal and equal Skin: warm and dry Neuro:  CNs 2-12 intact, no focal abnormalities noted Psych:  Normal affect   EKG:  Dated March 2013 sinus rhythm at 75 Intervals 19/19/46  Labs:   Lab Results  Component Value Date   WBC 8.9 08/06/2011   HGB 14.2 08/06/2011   HCT 42.1 08/06/2011   MCV 93.4 08/06/2011   PLT 207.0 08/06/2011     BNP Pro B Natriuretic peptide (BNP)  Date/Time Value Range Status  08/22/2011  2:55 PM 224.0* 0.0-100.0 (pg/mL) Final  06/30/2011  1:18 PM 372.0* 0.0-100.0 (pg/mL) Final  06/24/2011 12:17 PM 405.0* 0.0-100.0 (pg/mL) Final       ASSESSMENT AND PLAN:

## 2011-11-19 NOTE — Assessment & Plan Note (Signed)
As above.

## 2011-12-25 ENCOUNTER — Ambulatory Visit (HOSPITAL_COMMUNITY)
Admission: RE | Admit: 2011-12-25 | Discharge: 2011-12-25 | Disposition: A | Discharge status: Home / Self Care | Payer: BC Managed Care – HMO | Source: Ambulatory Visit | Attending: Internal Medicine | Admitting: Internal Medicine

## 2011-12-25 ENCOUNTER — Encounter (HOSPITAL_COMMUNITY): Payer: Self-pay

## 2011-12-25 VITALS — BP 115/70 | HR 69 | Ht 71.0 in | Wt 234.8 lb

## 2011-12-25 DIAGNOSIS — Z87891 Personal history of nicotine dependence: Secondary | ICD-10-CM | POA: Insufficient documentation

## 2011-12-25 DIAGNOSIS — E785 Hyperlipidemia, unspecified: Secondary | ICD-10-CM | POA: Insufficient documentation

## 2011-12-25 DIAGNOSIS — I10 Essential (primary) hypertension: Secondary | ICD-10-CM | POA: Insufficient documentation

## 2011-12-25 DIAGNOSIS — Z7982 Long term (current) use of aspirin: Secondary | ICD-10-CM | POA: Insufficient documentation

## 2011-12-25 DIAGNOSIS — I509 Heart failure, unspecified: Secondary | ICD-10-CM | POA: Insufficient documentation

## 2011-12-25 DIAGNOSIS — I5022 Chronic systolic (congestive) heart failure: Secondary | ICD-10-CM

## 2011-12-25 DIAGNOSIS — N529 Male erectile dysfunction, unspecified: Secondary | ICD-10-CM | POA: Insufficient documentation

## 2011-12-25 NOTE — Patient Instructions (Signed)
Your physician has requested that you have an echocardiogram. Echocardiography is a painless test that uses sound waves to create images of your heart. It provides your doctor with information about the size and shape of your heart and how well your heart's chambers and valves are working. This procedure takes approximately one hour. There are no restrictions for this procedure.  IN 1 MONTH  Your physician recommends that you schedule a follow-up appointment in: 1 month  

## 2011-12-25 NOTE — Progress Notes (Signed)
PCP:  Dr. Foy Gu[place]ara Primary Cardiologist:  Dr. Charlton Haws  Primary Electrophysiologist:  None     History of Present Illness: Louis Juarez is a 71 y.o. male with h/o HTN, obesity, previous tobacco use (mild restriction on PFTs, no obstruction) and LBBB.  He has been referred to HF clinic by Dr. Eden Emms to establish ongoing care of newly diagnosed CHF.  He developed dyspnea/wheezing in summer of 2012. Referred to Dr. Shelle Iron. Mild airway disease.   Echocardiogram 06/30/11: EF 10-15%, inferior, septal, apical akinesis, mild LVH, severe LAE, mild RAE, moderately reduced RVSF, PASP 52.    Cath by Dr. Excell Seltzer. Mild non-obs CAD. LM 20-30%, LAD ok, LCx 30-40% RCA 30-40% prox 50% distal. LVEF 10-15%.  RA 16  RV 54/24  PA 52/24 with a mean of 38  PCWP 23  LV 104/28  AO 101/61 with a mean of 78  Oxygen saturations:  PA 58  AO 94  Cardiac Output (Fick) 4  Cardiac Index (Fick) 1.8  SVR 1240 PVR 3.75 Woods  F/u echo: 11/03/2011 EF 25-30%. At last visit we discussed ICD but we debated the possibility of him being Class I vs Class II.  CPX test:  4/13: Spirometry: normal Peak VO2: 17.7 ml/kg/min predicted peak VO2: 86.5% (corrected to ideal weight VO2 44ml/kg/min) VE/VCO2 slope: 31.1 OUES: 2.11 Peak RER: 1.20 Ventilatory Threshold: 12.0 % predicted peak VO2: 58.7% VE/MVV: 64.9% PETCO2 at peak: 35 O2pulse: 15 % predicted O2pulse: 100%  He returns for follow up today.  He feels good but is frustrated with his care at this time.  He wants to know specifics about this test, I have discussed his CPX and echo with him.  He saw Dr. Graciela Husbands last month for possible ICD implantation.  He is waiting to talk to Dr. Gala Romney about this further.  He is compliant with all meds. Denies SOB/PND/Orthpnea/edema. He just returned from vacation and says he walked most of the time.     Past Medical History  Diagnosis Date  . Chronic systolic heart failure   . HTN (hypertension)   . Hyperlipidemia   . Erectile  dysfunction   . Insomnia   . DCM (dilated cardiomyopathy)     Current Outpatient Prescriptions  Medication Sig Dispense Refill  . amitriptyline (ELAVIL) 100 MG tablet Take 100 mg by mouth at bedtime.        Marland Kitchen aspirin 81 MG tablet Take 81 mg by mouth daily.        . carvedilol (COREG) 25 MG tablet Take 1 tablet (25 mg total) by mouth 2 (two) times daily.  60 tablet  6  . digoxin (LANOXIN) 0.125 MG tablet Take 1 tablet (125 mcg total) by mouth daily.  90 tablet  3  . furosemide (LASIX) 20 MG tablet Take 20 mg by mouth 2 (two) times daily.      Marland Kitchen LORazepam (ATIVAN) 0.5 MG tablet Take 1 mg by mouth 2 (two) times daily. 2 tabs in AM, 2 tabs in PM      . losartan (COZAAR) 100 MG tablet Take 1 tablet (100 mg total) by mouth daily.  90 tablet  3  . Multiple Vitamins-Minerals (MULTIVITAMIN WITH MINERALS) tablet Take 1 tablet by mouth daily.        . rosuvastatin (CRESTOR) 40 MG tablet Take 20 mg by mouth daily.       Marland Kitchen spironolactone (ALDACTONE) 25 MG tablet Take 25 mg by mouth daily.      Lilian Kapur 3.75 G PACK Take  3.75 g by mouth daily.       Marland Kitchen zolpidem (AMBIEN) 10 MG tablet Take 1 tablet by mouth At bedtime.      Marland Kitchen DISCONTD: albuterol (PROAIR HFA) 108 (90 BASE) MCG/ACT inhaler Inhale 2 puffs into the lungs as needed.  1 Inhaler  3    Allergies: No Known Allergies  History  Substance Use Topics  . Smoking status: Former Smoker -- 1.5 packs/day for 20 years    Types: Cigarettes    Quit date: 07/21/2001  . Smokeless tobacco: Not on file  . Alcohol Use: No     ROS:  Please see the history of present illness.   All other systems reviewed and negative.   PHYSICAL EXAM: Filed Vitals:   12/25/11 0859  BP: 115/70  Pulse: 69  Height: 5\' 11"  (1.803 m)  Weight: 234 lb 12.8 oz (106.505 kg)   General: Well nourished, well developed, in no acute distress HEENT: normal Neck: no JVD Cardiac:  PMI laterally displaced. RRR; no murmur. No s3 Lungs:  clear to auscultation bilaterally, no  wheezing, rhonchi or rales Abd: soft, obese. nontender, no hepatomegaly Ext: no edema Skin: warm and dry Neuro:  CNs 2-12 intact, no focal abnormalities noted   ASSESSMENT AND PLAN:

## 2011-12-26 ENCOUNTER — Telehealth: Payer: Self-pay | Admitting: *Deleted

## 2011-12-26 NOTE — Telephone Encounter (Signed)
Duke Salvia, MD More Detail >>      Duke Salvia, MD        Sent: Mon December 22, 2011  9:09 PM    To: Jefferey Pica, RN        Jakub Debold    MRN: [id number] DOB: 09/24/1940     Pt Home: [phone]               Message     Can we please schedule  This  Thanks steve ----- Message -----    From: Dolores Patty, MD    Sent: 12/17/2011   2:21 AM      To: Duke Salvia, MD  Yes. Thanks  ----- Message -----    From: Duke Salvia, MD    Sent: 12/16/2011  10:07 PM      To: Dolores Patty, MD  Dan are you wnating for [place] to proceed with ICD hree thanks ----- Message -----    From: Jefferey Pica, RN    Sent: 12/03/2011  11:33 AM      To: Duke Salvia, MD  Did you talk to DB about possible ICD implant?       Forwarded by:     Duke Salvia  Date:  12/22/2011

## 2011-12-26 NOTE — Telephone Encounter (Signed)
I left a message for the patient to call regarding scheduling him for a bi-v icd implant. In looking in his chart, I do see where he was seen in the CHF clinic yesterday. I am not sure if the patient is going to discuss device implant further with Dr. Gala Romney. He has an appointment for a repeat echo and CHF visit on 01/19/12. I will await a call back from the patient to discuss.

## 2011-12-27 NOTE — Assessment & Plan Note (Addendum)
Attending: Patient seen and examined with Ulyess Blossom, PA-C. We discussed all aspects of the encounter. I agree with the assessment and plan as stated above.  He continues to do very well. Now NYHA I-II. Volume status looks good. He is on good meds. We had a long talk about his frustration with his visit with Dr. Graciela Husbands. At this point we will hold off on ICD placement. Will repeat echo in 1-2 months, if EF still depressed (and NYHA II) will refer to Dr. Johney Frame for consideration of CRT-D.

## 2011-12-29 NOTE — Telephone Encounter (Signed)
I spoke with the patient this morning about setting up his ICD implant. He is quite upset about the care he has been receiving. He was very disappointed with the visit he had with Dr. Graciela Husbands stating he was "rude and sarcastic." He did see the heart failure clinic last week and I explained I had reviewed that note and seen where it was recommended for a repeat echo and follow up visit with them in 1 month. After listening to the patient and what he has been going through, he did state he felt better. He states he is not completely ruling out Dr. Graciela Husbands implanting his device, but will see where things stand in 1 month. I explained to the patient I will call him back the week of his visit to follow up. He is having his having an echo and office done on 01/19/12. He is agreeable with this.

## 2012-01-19 ENCOUNTER — Ambulatory Visit (HOSPITAL_COMMUNITY)
Admission: RE | Admit: 2012-01-19 | Discharge: 2012-01-19 | Disposition: A | Discharge status: Home / Self Care | Payer: BC Managed Care – HMO | Source: Ambulatory Visit | Attending: Internal Medicine | Admitting: Internal Medicine

## 2012-01-19 ENCOUNTER — Encounter (HOSPITAL_COMMUNITY): Payer: Self-pay

## 2012-01-19 VITALS — BP 120/60 | HR 84 | Ht 71.0 in | Wt 222.4 lb

## 2012-01-19 DIAGNOSIS — I5022 Chronic systolic (congestive) heart failure: Secondary | ICD-10-CM | POA: Insufficient documentation

## 2012-01-19 DIAGNOSIS — I10 Essential (primary) hypertension: Secondary | ICD-10-CM | POA: Insufficient documentation

## 2012-01-19 DIAGNOSIS — I447 Left bundle-branch block, unspecified: Secondary | ICD-10-CM | POA: Insufficient documentation

## 2012-01-19 DIAGNOSIS — R0609 Other forms of dyspnea: Secondary | ICD-10-CM | POA: Insufficient documentation

## 2012-01-19 DIAGNOSIS — I517 Cardiomegaly: Secondary | ICD-10-CM | POA: Insufficient documentation

## 2012-01-19 DIAGNOSIS — I4949 Other premature depolarization: Secondary | ICD-10-CM | POA: Insufficient documentation

## 2012-01-19 DIAGNOSIS — I059 Rheumatic mitral valve disease, unspecified: Secondary | ICD-10-CM

## 2012-01-19 DIAGNOSIS — N529 Male erectile dysfunction, unspecified: Secondary | ICD-10-CM | POA: Insufficient documentation

## 2012-01-19 DIAGNOSIS — E785 Hyperlipidemia, unspecified: Secondary | ICD-10-CM | POA: Insufficient documentation

## 2012-01-19 DIAGNOSIS — I509 Heart failure, unspecified: Secondary | ICD-10-CM | POA: Insufficient documentation

## 2012-01-19 DIAGNOSIS — Z7982 Long term (current) use of aspirin: Secondary | ICD-10-CM | POA: Insufficient documentation

## 2012-01-19 DIAGNOSIS — Z87891 Personal history of nicotine dependence: Secondary | ICD-10-CM | POA: Insufficient documentation

## 2012-01-19 DIAGNOSIS — R062 Wheezing: Secondary | ICD-10-CM | POA: Insufficient documentation

## 2012-01-19 DIAGNOSIS — E669 Obesity, unspecified: Secondary | ICD-10-CM | POA: Insufficient documentation

## 2012-01-19 DIAGNOSIS — R0989 Other specified symptoms and signs involving the circulatory and respiratory systems: Secondary | ICD-10-CM | POA: Insufficient documentation

## 2012-01-19 DIAGNOSIS — G4700 Insomnia, unspecified: Secondary | ICD-10-CM | POA: Insufficient documentation

## 2012-01-19 DIAGNOSIS — I428 Other cardiomyopathies: Secondary | ICD-10-CM | POA: Insufficient documentation

## 2012-01-19 MED ORDER — SPIRONOLACTONE 25 MG PO TABS: 25.0000 mg | ORAL_TABLET | Freq: Every day | ORAL | Status: DC

## 2012-01-19 NOTE — Progress Notes (Signed)
PCP:  Dr. Foy Guadal[place]ara Primary Cardiologist:  Dr. Charlton Haws  Primary Electrophysiologist:  None     History of Present Illness: Louis Juarez is a 71 y.o. male with h/o HTN, obesity, previous tobacco use (mild restriction on PFTs, no obstruction) and LBBB.  He has been referred to HF clinic by Dr. Eden Emms to establish ongoing care of newly diagnosed CHF.  He developed dyspnea/wheezing in summer of 2012. Referred to Dr. Shelle Iron. Mild airway disease.   Echocardiogram 06/30/11: EF 10-15%, inferior, septal, apical akinesis, mild LVH, severe LAE, mild RAE, moderately reduced RVSF, PASP 52.    Cath by Dr. Excell Seltzer. Mild non-obs CAD. LM 20-30%, LAD ok, LCx 30-40% RCA 30-40% prox 50% distal. LVEF 10-15%.  RA 16  RV 54/24  PA 52/24 with a mean of 38  PCWP 23  LV 104/28  AO 101/61 with a mean of 78  Oxygen saturations:  PA 58  AO 94  Cardiac Output (Fick) 4  Cardiac Index (Fick) 1.8  SVR 1240 PVR 3.75 Woods  F/u echo: 11/03/2011 EF 25-30%. At last visit we discussed ICD but we debated the possibility of him being Class I vs Class II.  CPX test:  4/13: Spirometry: normal Peak VO2: 17.7 ml/kg/min predicted peak VO2: 86.5% (corrected to ideal weight VO2 15ml/kg/min) VE/VCO2 slope: 31.1 OUES: 2.11 Peak RER: 1.20 Ventilatory Threshold: 12.0 % predicted peak VO2: 58.7% VE/MVV: 64.9% PETCO2 at peak: 35 O2pulse: 15 % predicted O2pulse: 100%  He returns for follow up today for 1 month follow up.  He had an echo this morning.  He continues to feel well.  He is walking and completing chores without difficulty.  He mowed his lawn for 4 hour this weekend and worked in the garden.  No SOB/edema/orthopnea/PND.  No fatigue.     Past Medical History  Diagnosis Date  . Chronic systolic heart failure   . HTN (hypertension)   . Hyperlipidemia   . Erectile dysfunction   . Insomnia   . DCM (dilated cardiomyopathy)     Current Outpatient Prescriptions  Medication Sig Dispense Refill  . amitriptyline  (ELAVIL) 100 MG tablet Take 100 mg by mouth at bedtime.        Marland Kitchen aspirin 81 MG tablet Take 81 mg by mouth daily.        . carvedilol (COREG) 25 MG tablet Take 1 tablet (25 mg total) by mouth 2 (two) times daily.  60 tablet  6  . digoxin (LANOXIN) 0.125 MG tablet Take 1 tablet (125 mcg total) by mouth daily.  90 tablet  3  . furosemide (LASIX) 20 MG tablet Take 20 mg by mouth 2 (two) times daily.      Marland Kitchen LORazepam (ATIVAN) 0.5 MG tablet Take 1 mg by mouth 2 (two) times daily. 2 tabs in AM, 2 tabs in PM      . losartan (COZAAR) 100 MG tablet Take 1 tablet (100 mg total) by mouth daily.  90 tablet  3  . Multiple Vitamins-Minerals (MULTIVITAMIN WITH MINERALS) tablet Take 1 tablet by mouth daily.        . rosuvastatin (CRESTOR) 40 MG tablet Take 20 mg by mouth daily.       Marland Kitchen spironolactone (ALDACTONE) 25 MG tablet Take 25 mg by mouth daily.      Lilian Kapur 3.75 G PACK Take 3.75 g by mouth daily.       Marland Kitchen zolpidem (AMBIEN) 10 MG tablet Take 1 tablet by mouth At bedtime.      Marland Kitchen  DISCONTD: albuterol (PROAIR HFA) 108 (90 BASE) MCG/ACT inhaler Inhale 2 puffs into the lungs as needed.  1 Inhaler  3    Allergies: No Known Allergies  History  Substance Use Topics  . Smoking status: Former Smoker -- 1.5 packs/day for 20 years    Types: Cigarettes    Quit date: 07/21/2001  . Smokeless tobacco: Not on file  . Alcohol Use: No     ROS:  Please see the history of present illness.   All other systems reviewed and negative.   PHYSICAL EXAM: Filed Vitals:   01/19/12 0857  BP: 120/60  Pulse: 84  Height: 5\' 11"  (1.803 m)  Weight: 222 lb 6.4 oz (100.88 kg)   General: Well nourished, well developed, in no acute distress HEENT: normal Neck: no JVD Cardiac:  PMI laterally displaced. RRR; no murmur. No s3 Lungs:  clear to auscultation bilaterally, no wheezing, rhonchi or rales Abd: soft, obese. nontender, no hepatomegaly Ext: no edema Skin: warm and dry Neuro:  CNs 2-12 intact, no focal abnormalities  noted   ASSESSMENT AND PLAN:

## 2012-01-19 NOTE — Progress Notes (Signed)
  Echocardiogram 2D Echocardiogram has been performed.  Georgian Co 01/19/2012, 8:47 AM

## 2012-01-19 NOTE — Assessment & Plan Note (Signed)
As above, will refer for BiV- ICD

## 2012-01-19 NOTE — Patient Instructions (Addendum)
Will call you with results of echo and discuss ICD placement.  Continue current medications.    Follow up with Dr. Gala Romney in 3 months.

## 2012-01-19 NOTE — Assessment & Plan Note (Addendum)
NYHA II.  Volume status remains stable.  Will continue current medications.  EF has remained depress at 15%.  Will refer for BiV ICD implantation with Dr. Johney Frame.  Follow up 3 months.

## 2012-01-26 ENCOUNTER — Telehealth: Payer: Self-pay | Admitting: Internal Medicine

## 2012-01-26 NOTE — Telephone Encounter (Signed)
Pt wanted to give you a up date on his decision regarding surgery

## 2012-01-26 NOTE — Telephone Encounter (Signed)
I spoke with the patient. He states that he has spoken with Dr. Gala Romney and the decision is to proceed with ICD implant. He was due to see Dr. Gala Romney last week and per the patient, Dr. Gala Romney was stuck in ground fog in [address]. He states that Dr. Gala Romney is out this week, but he has a call in to his office to see what the decision is on who he wants to perform the implant (possibly Dr. Johney Frame). The patient will update me after hearing from Dr. Prescott Gum office.

## 2012-02-02 ENCOUNTER — Encounter: Payer: Self-pay | Admitting: Internal Medicine

## 2012-02-02 ENCOUNTER — Ambulatory Visit (INDEPENDENT_AMBULATORY_CARE_PROVIDER_SITE_OTHER): Payer: BC Managed Care – PPO | Admitting: Internal Medicine

## 2012-02-02 ENCOUNTER — Encounter (HOSPITAL_COMMUNITY): Payer: Self-pay | Admitting: Pharmacy Technician

## 2012-02-02 VITALS — BP 142/78 | HR 81 | Resp 18 | Ht 71.0 in | Wt 223.1 lb

## 2012-02-02 DIAGNOSIS — I447 Left bundle-branch block, unspecified: Secondary | ICD-10-CM

## 2012-02-02 DIAGNOSIS — I5022 Chronic systolic (congestive) heart failure: Secondary | ICD-10-CM

## 2012-02-02 DIAGNOSIS — I428 Other cardiomyopathies: Secondary | ICD-10-CM

## 2012-02-02 DIAGNOSIS — I10 Essential (primary) hypertension: Secondary | ICD-10-CM

## 2012-02-02 DIAGNOSIS — I509 Heart failure, unspecified: Secondary | ICD-10-CM

## 2012-02-02 LAB — CBC WITH DIFFERENTIAL/PLATELET
Basophils Relative: 0.7 % (ref 0.0–3.0)
Eosinophils Relative: 2.9 % (ref 0.0–5.0)
Hemoglobin: 14.5 g/dL (ref 13.0–17.0)
Lymphocytes Relative: 29.4 % (ref 12.0–46.0)
MCHC: 34 g/dL (ref 30.0–36.0)
Monocytes Relative: 10.5 % (ref 3.0–12.0)
Neutro Abs: 4.7 10*3/uL (ref 1.4–7.7)
Neutrophils Relative %: 56.5 % (ref 43.0–77.0)
RBC: 4.5 Mil/uL (ref 4.22–5.81)
WBC: 8.4 10*3/uL (ref 4.5–10.5)

## 2012-02-02 LAB — BASIC METABOLIC PANEL
CO2: 28 mEq/L (ref 19–32)
Calcium: 9.4 mg/dL (ref 8.4–10.5)
GFR: 79.97 mL/min (ref >60.00)
Sodium: 137 mEq/L (ref 135–145)

## 2012-02-02 NOTE — Patient Instructions (Addendum)
Your physician has recommended that you have a Bi-V ICD inserted. An implantable cardioverter defibrillator (ICD) is a small device that is placed in your chest or, in rare cases, your abdomen. This device uses electrical pulses or shocks to help control life-threatening, irregular heartbeats that could lead the heart to suddenly stop beating (sudden cardiac arrest). Leads are attached to the ICD that goes into your heart. This is done in the hospital and usually requires an overnight stay. Please see the instruction sheet given to you today for more information.

## 2012-02-02 NOTE — Assessment & Plan Note (Signed)
Stable No change required today  

## 2012-02-02 NOTE — Progress Notes (Signed)
 Primary Care Physician: SHAW, KIMBERLY, CNM Referring Physician:  Dr Bensimhon   Louis Juarez is a 70 y.o. 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  He reports being diagnosed with a nonischemic CM in December.  He has been treated with an optimal medical therapy since that time.  Prior cath revealed nonobstructive CAD.  After 6 months of medical therapy, echo 01/19/12 reveals EF 15%.  He also has a chronic LBBB. Presently, he seems to feel well.  He remains active.  His SOB from January continues to improve and he has restarted most activity.  He feels that he would be SOB with 2 flights of stairs.  He continues to do most house work without difficulty. Upon being seen by Dr Klein in May, Dr Klein's notes states that he was a candidate for CRT-D.  The patient however did not feel that Dr Klein wanted to proceed with ICD and was quite frustrated with the interaction.  After further discussion with Dr Bensimhon, the patient presents today for further evaluation by me.  Today, he denies symptoms of palpitations, chest pain,  orthopnea, PND, lower extremity edema, dizziness, presyncope, syncope, or neurologic sequela. The patient is tolerating medications without difficulties and is otherwise without complaint today.   Past Medical History  Diagnosis Date  . Chronic systolic heart failure   . HTN (hypertension)   . Hyperlipidemia   . Erectile dysfunction   . Insomnia   . DCM (dilated cardiomyopathy)    Past Surgical History  Procedure Date  . Nasal sinus surgery 93, 97, 2010, 2006    Current Outpatient Prescriptions  Medication Sig Dispense Refill  . amitriptyline (ELAVIL) 100 MG tablet Take 100 mg by mouth at bedtime.        . aspirin 81 MG tablet Take 81 mg by mouth daily.        . carvedilol (COREG) 25 MG tablet Take 1 tablet (25 mg total) by mouth 2 (two) times daily.  60 tablet   6  . digoxin (LANOXIN) 0.125 MG tablet Take 1 tablet (125 mcg total) by mouth daily.  90 tablet  3  . furosemide (LASIX) 20 MG tablet Take 20 mg by mouth 2 (two) times daily.      . LORazepam (ATIVAN) 0.5 MG tablet Take 1 mg by mouth 2 (two) times daily. 2 tabs in AM, 2 tabs in PM      . losartan (COZAAR) 100 MG tablet Take 1 tablet (100 mg total) by mouth daily.  90 tablet  3  . Multiple Vitamins-Minerals (MULTIVITAMIN WITH MINERALS) tablet Take 1 tablet by mouth daily.        . rosuvastatin (CRESTOR) 40 MG tablet Take 20 mg by mouth daily.       . spironolactone (ALDACTONE) 25 MG tablet Take 1 tablet (25 mg total) by mouth daily.  90 tablet  3  . WELCHOL 3.75 G PACK Take 3.75 g by mouth daily.       . zolpidem (AMBIEN) 10 MG tablet Take 1 tablet by mouth At bedtime.      . DISCONTD: albuterol (PROAIR HFA) 108 (90 BASE) MCG/ACT inhaler Inhale 2 puffs into the lungs as needed.  1 Inhaler  3    No Known Allergies  History   Social History  . Marital Status: Married    Spouse Name: N/A    Number of Children:   N/A  . Years of Education: N/A   Occupational History  . retired    Social History Main Topics  . Smoking status: Former Smoker -- 1.5 packs/day for 20 years    Types: Cigarettes    Quit date: 07/21/2001  . Smokeless tobacco: Not on file  . Alcohol Use: No  . Drug Use: No  . Sexually Active: Not on file   Other Topics Concern  . Not on file   Social History Narrative   Lives in [place].  Retired from the [employer] x 6 years.  He is originally from [place].  Married.  1 grown son.      Family History  Problem Relation Age of Onset  . Colon cancer Father   . Melanoma Mother   . Melanoma Sister     ROS- All systems are reviewed and negative except as per the HPI above  Physical Exam: Filed Vitals:   02/02/12 1442  BP: 142/78  Pulse: 81  Resp: 18  Height: 5' 11" (1.803 m)  Weight: 223 lb 1.9 oz (101.207 kg)  SpO2: 97%    GEN- The patient is  well appearing, alert and oriented x 3 today.   Head- normocephalic, atraumatic Eyes-  Sclera clear, conjunctiva pink Ears- hearing intact Oropharynx- clear Neck- supple, no JVP Lymph- no cervical lymphadenopathy Lungs- Clear to ausculation bilaterally, normal work of breathing Heart- Regular rate and rhythm, no murmurs, rubs or gallops, PMI not laterally displaced GI- soft, NT, ND, + BS Extremities- no clubbing, cyanosis, or edema MS- no significant deformity or atrophy Skin- no rash or lesion, + R arm tatoo Psych- euthymic mood, full affect Neuro- strength and sensation are intact  EKG today reveals sinus rhythm 81 bpm, PR 186, QRS 188, Qtc 513, LBBB Echo 01/19/12 reviewed Dr Klein and Dr Bensimhon's office notes are reviewed.  Assessment and Plan:  

## 2012-02-02 NOTE — Assessment & Plan Note (Signed)
As above.

## 2012-02-02 NOTE — Assessment & Plan Note (Signed)
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 At this time, he meets SCD-HeFT criteria for ICD implantation for primary prevention of sudden death. Given LBBB with QRS >167msec, he also meets criteria for resynchronization.  Risks, benefits, alternatives to BiV ICD implantation were discussed in detail with the patient today. The patient  understands that the risks include but are not limited to bleeding, infection, pneumothorax, perforation, tamponade, vascular damage, renal failure, MI, stroke, death, inappropriate shocks, and lead dislodgement and wishes to proceed.  We will therefore schedule device implantation at the next available time.

## 2012-02-05 ENCOUNTER — Encounter: Payer: Self-pay | Admitting: *Deleted

## 2012-02-09 ENCOUNTER — Telehealth (HOSPITAL_COMMUNITY): Payer: Self-pay | Admitting: Internal Medicine

## 2012-02-09 ENCOUNTER — Other Ambulatory Visit: Payer: Self-pay | Admitting: *Deleted

## 2012-02-09 DIAGNOSIS — I428 Other cardiomyopathies: Secondary | ICD-10-CM

## 2012-02-09 NOTE — Telephone Encounter (Signed)
Please call pt Louis Juarez, he would like to talk to you concerning his surgery for his pacemaker. Thanks.

## 2012-02-11 ENCOUNTER — Telehealth (HOSPITAL_COMMUNITY): Payer: Self-pay | Admitting: *Deleted

## 2012-02-11 NOTE — Telephone Encounter (Signed)
Dr Leory Plowman called and left pt a mess this AM

## 2012-02-11 NOTE — Telephone Encounter (Signed)
Mr Venditto said he received a call from Dr Teressa Lower this morning and he was returning his call.  Please call Mr Eaves

## 2012-02-12 MED ORDER — CEFAZOLIN SODIUM-DEXTROSE 2-3 GM-% IV SOLR
2.0000 g | INTRAVENOUS | Status: DC
  Filled 2012-02-12 (×2): qty 50

## 2012-02-12 MED ORDER — SODIUM CHLORIDE 0.9 % IR SOLN
80.0000 mg | Status: DC
  Filled 2012-02-12: qty 2

## 2012-02-12 NOTE — Telephone Encounter (Signed)
Dr Bensimhon spoke w/pt 

## 2012-02-13 ENCOUNTER — Encounter (HOSPITAL_COMMUNITY): Payer: Self-pay | Admitting: General Practice

## 2012-02-13 ENCOUNTER — Encounter (HOSPITAL_COMMUNITY)
Admission: RE | Disposition: A | Discharge status: Home / Self Care | Payer: Self-pay | Source: Ambulatory Visit | Attending: Internal Medicine

## 2012-02-13 ENCOUNTER — Ambulatory Visit (HOSPITAL_COMMUNITY)
Admission: RE | Admit: 2012-02-13 | Discharge: 2012-02-14 | Disposition: A | Discharge status: Home / Self Care | Payer: BC Managed Care – PPO | Source: Ambulatory Visit | Attending: Internal Medicine | Admitting: Internal Medicine

## 2012-02-13 DIAGNOSIS — I5022 Chronic systolic (congestive) heart failure: Secondary | ICD-10-CM | POA: Insufficient documentation

## 2012-02-13 DIAGNOSIS — I447 Left bundle-branch block, unspecified: Secondary | ICD-10-CM | POA: Insufficient documentation

## 2012-02-13 DIAGNOSIS — I119 Hypertensive heart disease without heart failure: Secondary | ICD-10-CM | POA: Diagnosis present

## 2012-02-13 DIAGNOSIS — I509 Heart failure, unspecified: Secondary | ICD-10-CM

## 2012-02-13 DIAGNOSIS — I428 Other cardiomyopathies: Secondary | ICD-10-CM | POA: Insufficient documentation

## 2012-02-13 DIAGNOSIS — N529 Male erectile dysfunction, unspecified: Secondary | ICD-10-CM | POA: Insufficient documentation

## 2012-02-13 DIAGNOSIS — E785 Hyperlipidemia, unspecified: Secondary | ICD-10-CM | POA: Insufficient documentation

## 2012-02-13 DIAGNOSIS — Z95810 Presence of automatic (implantable) cardiac defibrillator: Secondary | ICD-10-CM

## 2012-02-13 DIAGNOSIS — I10 Essential (primary) hypertension: Secondary | ICD-10-CM | POA: Insufficient documentation

## 2012-02-13 DIAGNOSIS — G4700 Insomnia, unspecified: Secondary | ICD-10-CM | POA: Insufficient documentation

## 2012-02-13 HISTORY — PX: BI-VENTRICULAR IMPLANTABLE CARDIOVERTER DEFIBRILLATOR  (CRT-D): SHX5747

## 2012-02-13 HISTORY — PX: BI-VENTRICULAR IMPLANTABLE CARDIOVERTER DEFIBRILLATOR: SHX5459

## 2012-02-13 HISTORY — DX: Malignant (primary) neoplasm, unspecified: C80.1

## 2012-02-13 HISTORY — DX: Presence of automatic (implantable) cardiac defibrillator: Z95.810

## 2012-02-13 HISTORY — PX: CARDIAC DEFIBRILLATOR PLACEMENT: SHX171

## 2012-02-13 HISTORY — DX: Heart failure, unspecified: I50.9

## 2012-02-13 LAB — SURGICAL PCR SCREEN
MRSA, PCR: NEGATIVE
Staphylococcus aureus: NEGATIVE

## 2012-02-13 SURGERY — BI-VENTRICULAR IMPLANTABLE CARDIOVERTER DEFIBRILLATOR  (CRT-D)
Anesthesia: LOCAL

## 2012-02-13 MED ORDER — LOSARTAN POTASSIUM 50 MG PO TABS
100.0000 mg | ORAL_TABLET | Freq: Every day | ORAL | Status: DC
  Filled 2012-02-13: qty 2

## 2012-02-13 MED ORDER — ONDANSETRON HCL 4 MG/2ML IJ SOLN: 4.0000 mg | Freq: Four times a day (QID) | INTRAMUSCULAR | Status: DC | PRN

## 2012-02-13 MED ORDER — CHLORHEXIDINE GLUCONATE 4 % EX LIQD: 60.0000 mL | Freq: Once | CUTANEOUS | Status: DC

## 2012-02-13 MED ORDER — MIDAZOLAM HCL 2 MG/2ML IJ SOLN
INTRAMUSCULAR | Status: AC
  Filled 2012-02-13: qty 2

## 2012-02-13 MED ORDER — LORAZEPAM 0.5 MG PO TABS
1.0000 mg | ORAL_TABLET | Freq: Two times a day (BID) | ORAL | Status: DC
  Administered 2012-02-13: 22:00:00 1 mg via ORAL
  Filled 2012-02-13 (×2): qty 2

## 2012-02-13 MED ORDER — AMITRIPTYLINE HCL 100 MG PO TABS
100.0000 mg | ORAL_TABLET | Freq: Every day | ORAL | Status: DC
  Administered 2012-02-13: 100 mg via ORAL
  Filled 2012-02-13 (×2): qty 1

## 2012-02-13 MED ORDER — YOU HAVE A PACEMAKER BOOK
Freq: Once | Status: AC
  Administered 2012-02-13: 22:00:00
  Filled 2012-02-13: qty 1

## 2012-02-13 MED ORDER — PNEUMOCOCCAL VAC POLYVALENT 25 MCG/0.5ML IJ INJ
0.5000 mL | INJECTION | Freq: Once | INTRAMUSCULAR | Status: DC
  Filled 2012-02-13: qty 0.5

## 2012-02-13 MED ORDER — SODIUM CHLORIDE 0.9 % IV SOLN: 250.0000 mL | INTRAVENOUS | Status: DC

## 2012-02-13 MED ORDER — SODIUM CHLORIDE 0.45 % IV SOLN
INTRAVENOUS | Status: DC
  Administered 2012-02-13: 07:00:00 via INTRAVENOUS

## 2012-02-13 MED ORDER — CEFAZOLIN SODIUM 1-5 GM-% IV SOLN
1.0000 g | Freq: Four times a day (QID) | INTRAVENOUS | Status: AC
  Administered 2012-02-13 – 2012-02-14 (×3): 1 g via INTRAVENOUS
  Filled 2012-02-13 (×3): qty 50

## 2012-02-13 MED ORDER — MUPIROCIN 2 % EX OINT
TOPICAL_OINTMENT | Freq: Once | CUTANEOUS | Status: AC
  Administered 2012-02-13: 1 via NASAL
  Filled 2012-02-13: qty 22

## 2012-02-13 MED ORDER — DIGOXIN 125 MCG PO TABS
125.0000 ug | ORAL_TABLET | Freq: Every day | ORAL | Status: DC
  Filled 2012-02-13 (×2): qty 1

## 2012-02-13 MED ORDER — ZOLPIDEM TARTRATE 5 MG PO TABS
5.0000 mg | ORAL_TABLET | Freq: Every evening | ORAL | Status: DC | PRN
  Administered 2012-02-13: 22:00:00 5 mg via ORAL
  Filled 2012-02-13: qty 1

## 2012-02-13 MED ORDER — ACETAMINOPHEN 325 MG PO TABS
325.0000 mg | ORAL_TABLET | ORAL | Status: DC | PRN
  Administered 2012-02-13: 650 mg via ORAL
  Filled 2012-02-13: qty 2

## 2012-02-13 MED ORDER — LIDOCAINE HCL (PF) 1 % IJ SOLN
INTRAMUSCULAR | Status: AC
  Filled 2012-02-13: qty 60

## 2012-02-13 MED ORDER — SPIRONOLACTONE 25 MG PO TABS
25.0000 mg | ORAL_TABLET | Freq: Every day | ORAL | Status: DC
  Filled 2012-02-13: qty 1

## 2012-02-13 MED ORDER — SODIUM CHLORIDE 0.9 % IJ SOLN: 3.0000 mL | Freq: Two times a day (BID) | INTRAMUSCULAR | Status: DC

## 2012-02-13 MED ORDER — SODIUM CHLORIDE 0.9 % IJ SOLN: 3.0000 mL | INTRAMUSCULAR | Status: DC | PRN

## 2012-02-13 MED ORDER — SODIUM CHLORIDE 0.9 % IJ SOLN
3.0000 mL | Freq: Two times a day (BID) | INTRAMUSCULAR | Status: DC
  Administered 2012-02-13 (×2): 3 mL via INTRAVENOUS

## 2012-02-13 MED ORDER — HYDROCODONE-ACETAMINOPHEN 5-325 MG PO TABS
1.0000 | ORAL_TABLET | ORAL | Status: DC | PRN
  Administered 2012-02-14: 1 via ORAL
  Filled 2012-02-13: qty 1

## 2012-02-13 MED ORDER — SODIUM CHLORIDE 0.9 % IV SOLN: 250.0000 mL | INTRAVENOUS | Status: DC | PRN

## 2012-02-13 MED ORDER — FENTANYL CITRATE 0.05 MG/ML IJ SOLN
INTRAMUSCULAR | Status: AC
  Filled 2012-02-13: qty 2

## 2012-02-13 MED ORDER — CARVEDILOL 25 MG PO TABS
25.0000 mg | ORAL_TABLET | Freq: Two times a day (BID) | ORAL | Status: DC
  Administered 2012-02-13: 22:00:00 25 mg via ORAL
  Filled 2012-02-13 (×3): qty 1

## 2012-02-13 NOTE — H&P (View-Only) (Signed)
Primary Care Physician: Cam Hai, CNM Referring Physician:  Dr Gala Romney   Louis Juarez is a 71 y.o. 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  He reports being diagnosed with a nonischemic CM in December.  He has been treated with an optimal medical therapy since that time.  Prior cath revealed nonobstructive CAD.  After 6 months of medical therapy, echo 01/19/12 reveals EF 15%.  He also has a chronic LBBB. Presently, he seems to feel well.  He remains active.  His SOB from January continues to improve and he has restarted most activity.  He feels that he would be SOB with 2 flights of stairs.  He continues to do most house work without difficulty. Upon being seen by Dr Graciela Husbands in May, Dr Odessa Fleming notes states that he was a candidate for CRT-D.  The patient however did not feel that Dr Graciela Husbands wanted to proceed with ICD and was quite frustrated with the interaction.  After further discussion with Dr Gala Romney, the patient presents today for further evaluation by me.  Today, he denies symptoms of palpitations, chest pain,  orthopnea, PND, lower extremity edema, dizziness, presyncope, syncope, or neurologic sequela. The patient is tolerating medications without difficulties and is otherwise without complaint today.   Past Medical History  Diagnosis Date  . Chronic systolic heart failure   . HTN (hypertension)   . Hyperlipidemia   . Erectile dysfunction   . Insomnia   . DCM (dilated cardiomyopathy)    Past Surgical History  Procedure Date  . Nasal sinus surgery 93, 97, 2010, 2006    Current Outpatient Prescriptions  Medication Sig Dispense Refill  . amitriptyline (ELAVIL) 100 MG tablet Take 100 mg by mouth at bedtime.        Marland Kitchen aspirin 81 MG tablet Take 81 mg by mouth daily.        . carvedilol (COREG) 25 MG tablet Take 1 tablet (25 mg total) by mouth 2 (two) times daily.  60 tablet   6  . digoxin (LANOXIN) 0.125 MG tablet Take 1 tablet (125 mcg total) by mouth daily.  90 tablet  3  . furosemide (LASIX) 20 MG tablet Take 20 mg by mouth 2 (two) times daily.      Marland Kitchen LORazepam (ATIVAN) 0.5 MG tablet Take 1 mg by mouth 2 (two) times daily. 2 tabs in AM, 2 tabs in PM      . losartan (COZAAR) 100 MG tablet Take 1 tablet (100 mg total) by mouth daily.  90 tablet  3  . Multiple Vitamins-Minerals (MULTIVITAMIN WITH MINERALS) tablet Take 1 tablet by mouth daily.        . rosuvastatin (CRESTOR) 40 MG tablet Take 20 mg by mouth daily.       Marland Kitchen spironolactone (ALDACTONE) 25 MG tablet Take 1 tablet (25 mg total) by mouth daily.  90 tablet  3  . WELCHOL 3.75 G PACK Take 3.75 g by mouth daily.       Marland Kitchen zolpidem (AMBIEN) 10 MG tablet Take 1 tablet by mouth At bedtime.      Marland Kitchen DISCONTD: albuterol (PROAIR HFA) 108 (90 BASE) MCG/ACT inhaler Inhale 2 puffs into the lungs as needed.  1 Inhaler  3    No Known Allergies  History   Social History  . Marital Status: Married    Spouse Name: N/A    Number of Children:  N/A  . Years of Education: N/A   Occupational History  . retired    Social History Main Topics  . Smoking status: Former Smoker -- 1.5 packs/day for 20 years    Types: Cigarettes    Quit date: 07/21/2001  . Smokeless tobacco: Not on file  . Alcohol Use: No  . Drug Use: No  . Sexually Active: Not on file   Other Topics Concern  . Not on file   Social History Narrative   Lives in [place].  Retired from the [employer] x 6 years.  He is originally from [place]  Married.  1 grown son.      Family History  Problem Relation Age of Onset  . Colon cancer Father   . Melanoma Mother   . Melanoma Sister     ROS- All systems are reviewed and negative except as per the HPI above  Physical Exam: Filed Vitals:   02/02/12 1442  BP: 142/78  Pulse: 81  Resp: 18  Height: 5\' 11"  (1.803 m)  Weight: 223 lb 1.9 oz (101.207 kg)  SpO2: 97%    GEN- The patient is  well appearing, alert and oriented x 3 today.   Head- normocephalic, atraumatic Eyes-  Sclera clear, conjunctiva pink Ears- hearing intact Oropharynx- clear Neck- supple, no JVP Lymph- no cervical lymphadenopathy Lungs- Clear to ausculation bilaterally, normal work of breathing Heart- Regular rate and rhythm, no murmurs, rubs or gallops, PMI not laterally displaced GI- soft, NT, ND, + BS Extremities- no clubbing, cyanosis, or edema MS- no significant deformity or atrophy Skin- no rash or lesion, + R arm tatoo Psych- euthymic mood, full affect Neuro- strength and sensation are intact  EKG today reveals sinus rhythm 81 bpm, PR 186, QRS 188, Qtc 513, LBBB Echo 01/19/12 reviewed Dr Graciela Husbands and Dr Bensimhon's office notes are reviewed.  Assessment and Plan:

## 2012-02-13 NOTE — Progress Notes (Signed)
Orthopedic Tech Progress Note Patient Details:  Louis Juarez 1941-04-08 [id number]  Patient ID: Louis Juarez, male   DOB: [birth year], 71 y.o.   MRN: [id number] Confirmed pt has arm sling.  Leo Grosser T 02/13/2012, 3:41 PM

## 2012-02-13 NOTE — Op Note (Signed)
SURGEON:  Hillis Range, MD      PREPROCEDURE DIAGNOSES:   1. Nonischemic cardiomyopathy.   2. New York Heart Association class III, heart failure chronically.   3. Left bundle-branch block.      POSTPROCEDURE DIAGNOSES:   1. Nonischemic cardiomyopathy.   2. New York Heart Association class III heart failure chronically.   3. Left bundle-branch block.      PROCEDURES:    1. Left upper extremity venography  2. Biventricular ICD implantation.  3. Defibrillation threshold testing     INTRODUCTION:  Louis Juarez is a 71 y.o. male with a nonischemic CM (EF 30-35%), NYHA Class III CHF, and LBBB QRS morophology. At this time, he meets SCD-HeFT criteria for ICD implantation for primary prevention of sudden death.  Given LBBB, the patient may also be expected to benefit from resynchronization therapy. The patient has been treated with an optimal medical regimen but continues to have a depressed ejection fraction and NYHA Class III CHF symptoms.  he therefore  presents today for a biventricular ICD implantation.      DESCRIPTION OF PROCEDURE:  Informed written consent was obtained and the  patient was brought to the electrophysiology lab in the fasting state. The patient was adequately sedated with intravenous Versed, and fentanyl as outlined in the nursing report.  The patient's left chest was prepped and draped in the usual sterile fashion by the EP lab staff.  The skin overlying the left deltopectoral region was infiltrated with lidocaine for local analgesia.  A 5-cm incision was made over the left deltopectoral region.  A left subcutaneous defibrillator pocket was fashioned using a combination of sharp and blunt dissection.  Electrocautery was used to assure hemostasis.   Left Upper extremity Venography:  A venogram of the left upper extremity was performed which revealed a moderate sized left axillary vein which emptied into a moderate sized left subclavian vein.    RA/RV Lead Placement: The  left axillary vein was cannulated with fluoroscopic visualization.  Through the left axillary vein, a [employer]. Jude Medical Tendril STS, model 0454UJ-81  (serial # [id number] ) right atrial lead and a [employer]. [employer] Medical [place], model 1914N-82 (serial number [id number]) right ventricular defibrillator lead were advanced with fluoroscopic visualization into the right atrial appendage and right ventricular apex positions respectively.  Initial atrial lead P-waves measured 3.3 mV with an impedance of 744 ohms and a threshold of 1.0 volts at 0.5 milliseconds.  The right ventricular lead R-wave measured 24 mV with impedance of 765 ohms and a threshold of 0.8 volts at 0.5 milliseconds.   LV Lead Placement: A Medtronic MB-2 guide was advanced through the left axillary vein into the low lateral right atrium.  A Bard curved Damato catheter was introduced through the MB-2 guide and used to cannulate the coronary sinus.  Coronary sinus cannulation was confirmed with electrogram recording from the hexapolar catheter.  A coronary sinus selective venography balloon was advanced through the MB- 2 guide and advanced into the proximal portion of the coronary sinus.  A selective coronary sinus venogram was performed by hand injection of nonionic contrast.  This demonstrated a two moderate sized lateral coronary sinus branch along the mid portion of the CS body.  No other branches were identified.  A Whisper CSJ wire was introduced through the MB2 guided but could not adequately cannulate either lateral branch.  A right angled subselecting cathter was therefore used to engage the proximal lateral branch and the CSJ wire was then advanced into  the distal portion of the lateral branch. An mailman was then advanced into the same location.  A St. Jude Medical Quartet model 1458T - 86 (serial number [id number]) lead was advanced through the MB-2 into the lateral branch.   This was  approximately two-thirds from the base to the apex in a lateral  position.  In this location, the left ventricular lead R-waves measured  24 mV with impedance of 1263 ohms and a threshold of 1.3 volt at 0.5  milliseconds in the bipolar configuration with no diaphragmatic  stimulation observed when pacing at 10 volts output.  The MB-2 guide was  therefore removed.    All three leads were secured to the pectoralis  fascia using #2 silk suture over the suture sleeves.  The pocket then  irrigated with copious gentamicin solution.  The leads were then  connected to a [facility] model [phone] (serial  Number [id number]) biventricular ICD.  The defibrillator was placed into the  pocket.  The pocket was then closed in 2 layers with 2.0 Vicryl suture  for the subcutaneous and subcuticular layers.  Steri-Strips and a  sterile dressing were then applied.   DFT Testing: Defibrillation Threshold testing was then performed. Ventricular fibrillation was induced with a T shock.  Adequate sensing of ventricular  fibrillation was observed with minimal dropout with a programmed sensitivity of 1.60mV.  The patient was successfully defibrillated but converted to atrial fibrillation with a single 15 joules shock delivered from the device with an impedance of 88 ohms in a duration of 6.5 seconds.  The patient spontaneously converted to sinus rhythm and remained in sinus rhythm thereafter.  There were no early apparent complications.      CONCLUSIONS:   1. Nonischemic cardiomyopathy with Left bundle-branch block and chronic New York Heart Association class III heart failure.   2. Successful biventricular ICD implantation.   3. DFT less than or equal to 15 joules.   4. No early apparent complications.

## 2012-02-13 NOTE — Progress Notes (Addendum)
Pt visibly miserable, states hasn't voided since early this morning.  Abd fairly firm, guarding on palpation.  Vidal Schwalbe spoke with Dr Johney Frame who advised her to cath pt.  16 FR Foley cath inserted without difficulty using sterile technique, clear yellow return, pt voiced almost immediate relief.  600 cc urine out, tubing clamped for 10 minutes, then opened again for another 200 cc return.  Pt voices comfort, talkative, alert and oriented.

## 2012-02-13 NOTE — Interval H&P Note (Signed)
History and Physical Interval Note:  02/13/2012 7:04 AM  Louis Juarez  has presented today for surgery, with the diagnosis of chf  The various methods of treatment have been discussed with the patient and family. After consideration of risks, benefits and other options for treatment, the patient has consented to  Procedure(s) (LRB): BI-VENTRICULAR IMPLANTABLE CARDIOVERTER DEFIBRILLATOR  (CRT-D) (N/A) as a surgical intervention .  The patient's history has been reviewed, patient examined, no change in status, stable for surgery.  I have reviewed the patient's chart and labs.  Questions were answered to the patient's satisfaction.     Hillis Range

## 2012-02-13 NOTE — Progress Notes (Signed)
Foley deflated and removed, total 900 cc out.  Pt tolerated well.  Will continue to monitor output.

## 2012-02-14 ENCOUNTER — Ambulatory Visit (HOSPITAL_COMMUNITY): Payer: BC Managed Care – PPO

## 2012-02-14 DIAGNOSIS — I428 Other cardiomyopathies: Secondary | ICD-10-CM

## 2012-02-14 LAB — BASIC METABOLIC PANEL
CO2: 27 mEq/L (ref 19–32)
Calcium: 8.7 mg/dL (ref 8.4–10.5)
GFR calc non Af Amer: 89 mL/min — ABNORMAL LOW (ref >90)
Potassium: 3.8 mEq/L (ref 3.5–5.1)
Sodium: 139 mEq/L (ref 135–145)

## 2012-02-14 MED ORDER — HYDROCODONE-ACETAMINOPHEN 5-325 MG PO TABS: 1.0000 | ORAL_TABLET | ORAL | Status: AC | PRN

## 2012-02-14 NOTE — Progress Notes (Signed)
Doing well s/p BiV ICD No complaints  CXR reveals stable leads BiV ICD interrogation reviewed and normal.  DC to home Resume prior medicines Routine wound care and follow-up  Jarold Song

## 2012-02-14 NOTE — Discharge Summary (Signed)
Discharge Summary   Patient ID: Louis Juarez,  MRN: [id number], DOB/AGE: 1941/04/12 71 y.o.  Admit date: 02/13/2012 Discharge date: 02/14/2012  Primary Physician: Cam Hai, CNM Primary Cardiologist: Charlton Haws, MD; Nicholes Mango, MD (CHF); Hillis Range, MD (EP)  Discharge Diagnoses Principal Problem:  *Nonischemic cardiomyopathy Active Problems:  Chronic systolic heart failure  LBBB (left bundle branch block)  HTN (hypertension)   Allergies No Known Allergies  Diagnostic Studies/Procedures  02/13/12  1. Left upper extremity venography  2. St. Jude Biventricular ICD implantation  3. Defibrillation threshold testing  CONCLUSIONS:  1. Nonischemic cardiomyopathy with Left bundle-branch block and chronic New York Heart Association class III heart failure.  2. Successful biventricular ICD implantation.  3. DFT less than or equal to 15 joules.  4. No early apparent complications.   History of Present Illness/Hospital Course  Louis Juarez is a 71yo Caucasian male with the above problem list who underwent elective Bi-V ICD implantation. He has an EF of 35%, NYHA class III CHF and LBBB QRS morphology meeting SCD-HeFT criteria for ICD implantation for primary prevention of sudden death. Given LBBB morphology, CRT-D was believed to be of benefit as well. This was discussed with the patient. The risks, benefits and details of the procedure were discussed, and he wished to proceed. He presented on 02/13/12 for the procedure. Informed written consent was obtained. He underwent the procedure well without complications. Defibrillation threshold testing was then performed. Ventricular fibrillation was induced with a T shock. Adequate sensing of ventricular fibrillation was observed with minimal dropout with a programmed sensitivity of 1.19mV. The patient was successfully defibrillated but converted to atrial fibrillation with a single 15 joules shock delivered from the device with an  impedance of 88 ohms in a duration of 6.5 seconds. The patient spontaneously converted to sinus rhythm and remained in sinus rhythm thereafter. This morning, CXR reveals no evidence of pneumothorax, and device interrogation reveals normal functionality. He was assessed by Dr. Johney Frame and found to be stable for discharge. He will follow-up in the wound/device clinic in 7-10 days and with EP in 3 months. He will continue all outpatient medications. A prescription of Norco was provided PRN for pain on discharge.   PA/LATERAL CHEST X-RAY - 02/14/12  Reviewed by Dr. Johney Frame. No evidence of pneumothorax.   Discharge Vitals:  Blood pressure 132/65, pulse 72, temperature 98.1 F (36.7 C), temperature source Oral, resp. rate 21, height 5\' 11"  (1.803 m), weight 100.2 kg (220 lb 14.4 oz), SpO2 98.00%.   Weight change: 1.769 kg (3 lb 14.4 oz)  Labs:  Lab 02/14/12 0605  NA 139  K 3.8  CL 102  CO2 27  BUN 14  CREATININE 0.77  CALCIUM 8.7  PROT --  BILITOT --  ALKPHOS --  ALT --  AST --  AMYLASE --  LIPASE --  GLUCOSE 121*   Disposition:   Follow-up Information    Follow up with [facility]. (The office will call you with 7-10 day and 3 month follow-up, respectively. )    Contact information:   [address] [place] [address]          Discharge Medications:  Medication List  As of 02/14/2012  8:46 AM   START taking these medications         HYDROcodone-acetaminophen 5-325 MG per tablet   Commonly known as: NORCO/VICODIN   Take 1-2 tablets by mouth every 4 (four) hours as needed.  CONTINUE taking these medications         amitriptyline 100 MG tablet   Commonly known as: ELAVIL      aspirin 81 MG tablet      carvedilol 25 MG tablet   Commonly known as: COREG   Take 1 tablet (25 mg total) by mouth 2 (two) times daily.      digoxin 0.125 MG tablet   Commonly known as: LANOXIN   Take 1 tablet (125 mcg total) by mouth daily.       furosemide 20 MG tablet   Commonly known as: LASIX      LORazepam 0.5 MG tablet   Commonly known as: ATIVAN      losartan 100 MG tablet   Commonly known as: COZAAR   Take 1 tablet (100 mg total) by mouth daily.      multivitamin with minerals tablet      rosuvastatin 40 MG tablet   Commonly known as: CRESTOR      spironolactone 25 MG tablet   Commonly known as: ALDACTONE   Take 1 tablet (25 mg total) by mouth daily.      STOOL SOFTENER PO      WELCHOL 3.75 G Pack   Generic drug: Colesevelam HCl      zolpidem 10 MG tablet   Commonly known as: AMBIEN          Where to get your medications    These are the prescriptions that you need to pick up.   You may get these medications from any pharmacy.         HYDROcodone-acetaminophen 5-325 MG per tablet           Outstanding Labs/Studies: None  Duration of Discharge Encounter: Greater than 30 minutes including physician time.  Signed, R. Hurman Horn, PA-C 02/14/2012, 8:46 AM   I have seen, examined the patient, and reviewed the above assessment and plan.  Changes to above are made where necessary.    Co Sign: Hillis Range, MD 02/14/2012 3:03 PM

## 2012-02-14 NOTE — Plan of Care (Signed)
Problem: Consults Goal: Heart Failure Patient Education (See Patient Education module for education specifics.) Outcome: Completed/Met Date Met:  02/14/12 Discussed CHF principles with pt who states he sees Dr Gala Romney at [facility].  Pt demonstrates strong knowledge of CHF, weighs himself daily, and is compliant with meds.  CHF packet given w/ weight chart and color zone tool.

## 2012-02-16 ENCOUNTER — Encounter: Payer: Self-pay | Admitting: *Deleted

## 2012-02-16 DIAGNOSIS — Z95810 Presence of automatic (implantable) cardiac defibrillator: Secondary | ICD-10-CM

## 2012-02-16 HISTORY — DX: Presence of automatic (implantable) cardiac defibrillator: Z95.810

## 2012-02-25 ENCOUNTER — Ambulatory Visit (INDEPENDENT_AMBULATORY_CARE_PROVIDER_SITE_OTHER): Payer: BC Managed Care – PPO | Admitting: *Deleted

## 2012-02-25 ENCOUNTER — Encounter: Payer: Self-pay | Admitting: Internal Medicine

## 2012-02-25 DIAGNOSIS — Z95810 Presence of automatic (implantable) cardiac defibrillator: Secondary | ICD-10-CM

## 2012-02-25 DIAGNOSIS — I493 Ventricular premature depolarization: Secondary | ICD-10-CM

## 2012-02-25 DIAGNOSIS — I428 Other cardiomyopathies: Secondary | ICD-10-CM

## 2012-02-25 DIAGNOSIS — I4949 Other premature depolarization: Secondary | ICD-10-CM

## 2012-02-25 LAB — ICD DEVICE OBSERVATION
AL AMPLITUDE: 2 mv
AL IMPEDENCE ICD: 600 Ohm
DEVICE MODEL ICD: 7021876
FVT: 0
HV IMPEDENCE: 70 Ohm
LV LEAD IMPEDENCE ICD: 987.5 Ohm
RV LEAD AMPLITUDE: 12 mv
TOT-0007: 1
TOT-0008: 0
TOT-0009: 4
TOT-0010: 2
TZON-0003SLOWVT: 350 ms
TZON-0004SLOWVT: 20
TZON-0005SLOWVT: 6
TZON-0010SLOWVT: 40 ms
VF: 0

## 2012-02-25 NOTE — Progress Notes (Signed)
icd check in clinic  

## 2012-03-15 ENCOUNTER — Encounter: Payer: BC Managed Care – HMO | Admitting: Internal Medicine

## 2012-04-07 ENCOUNTER — Encounter: Payer: Self-pay | Admitting: Internal Medicine

## 2012-04-14 ENCOUNTER — Encounter: Payer: Self-pay | Admitting: Internal Medicine

## 2012-04-14 ENCOUNTER — Ambulatory Visit (HOSPITAL_COMMUNITY)
Admission: RE | Admit: 2012-04-14 | Discharge: 2012-04-14 | Disposition: A | Discharge status: Home / Self Care | Payer: BC Managed Care – PPO | Source: Ambulatory Visit | Attending: Internal Medicine | Admitting: Internal Medicine

## 2012-04-14 VITALS — BP 132/70 | HR 76 | Wt 223.0 lb

## 2012-04-14 DIAGNOSIS — R002 Palpitations: Secondary | ICD-10-CM

## 2012-04-14 DIAGNOSIS — I5022 Chronic systolic (congestive) heart failure: Secondary | ICD-10-CM | POA: Insufficient documentation

## 2012-04-14 HISTORY — DX: Palpitations: R00.2

## 2012-04-14 NOTE — Addendum Note (Signed)
Encounter addended by: Noralee Space, RN on: 04/14/2012 11:49 AM<BR>     Documentation filed: Patient Instructions Section

## 2012-04-14 NOTE — Assessment & Plan Note (Addendum)
By history it sounds like he is having PVCs. May have AF. Will interrogate ICD today.

## 2012-04-14 NOTE — Assessment & Plan Note (Signed)
Overall doing OK. NYHA II but now with some mild exertional symptoms. Volume status look. On good medications. Will continue to follow. May need to repeat CPX in future.

## 2012-04-14 NOTE — Progress Notes (Signed)
PCP:  Dr. Foy Guadalajara Primary Cardiologist:  Dr. Charlton Haws  Primary Electrophysiologist:  None     History of Present Illness: Sterlin Knightly is a 71 y.o. male with h/o HTN, obesity, previous tobacco use (mild restriction on PFTs, no obstruction) and LBBB.  He has been referred to HF clinic by Dr. Eden Emms to establish ongoing care of newly diagnosed CHF.  He developed dyspnea/wheezing in summer of 2012. Referred to Dr. Shelle Iron. Mild airway disease.   Echocardiogram 06/30/11: EF 10-15%, inferior, septal, apical akinesis, mild LVH, severe LAE, mild RAE, moderately reduced RVSF, PASP 52.    Cath by Dr. Excell Seltzer. Mild non-obs CAD. LM 20-30%, LAD ok, LCx 30-40% RCA 30-40% prox 50% distal. LVEF 10-15%.  RA 16  RV 54/24  PA 52/24 with a mean of 38  PCWP 23  LV 104/28  AO 101/61 with a mean of 78  Oxygen saturations:  PA 58  AO 94  Cardiac Output (Fick) 4  Cardiac Index (Fick) 1.8  SVR 1240 PVR 3.75 Woods  F/u echo: 11/03/2011 EF 25-30%. At last visit we discussed ICD but we debated the possibility of him being Class I vs Class II.  CPX test:  4/13: Spirometry: normal Peak VO2: 17.7 ml/kg/min predicted peak VO2: 86.5% (corrected to ideal weight VO2 26ml/kg/min) VE/VCO2 slope: 31.1 OUES: 2.11 Peak RER: 1.20 Ventilatory Threshold: 12.0 % predicted peak VO2: 58.7% VE/MVV: 64.9% PETCO2 at peak: 35 O2pulse: 15 % predicted O2pulse: 100%  Echo 7/13: EF 15%  He returns for follow up today. Remains active but notes that since his ICD implant his exertional dyspnea has gotten some worse. Also notes that when he sits down he often gets palpitations. Feels HR is a little faster then notices a big beat. Denies CP, orthopnea, PND or edema.    Past Medical History  Diagnosis Date  . Chronic systolic heart failure   . HTN (hypertension)   . Hyperlipidemia   . Erectile dysfunction   . Insomnia   . DCM (dilated cardiomyopathy)   . CHF (congestive heart failure)   . ICD (implantable cardiac  defibrillator) in place 02/13/2012  . 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 amitriptyline (ELAVIL) 100 MG tablet Take 100 mg by mouth at bedtime.        Marland Kitchen aspirin 81 MG tablet Take 81 mg by mouth daily.        . carvedilol (COREG) 25 MG tablet Take 1 tablet (25 mg total) by mouth 2 (two) times daily.  60 tablet  6  . digoxin (LANOXIN) 0.125 MG tablet Take 1 tablet (125 mcg total) by mouth daily.  90 tablet  3  . Docusate Calcium (STOOL SOFTENER PO) Take 100 mg by mouth 2 (two) times daily.      . furosemide (LASIX) 20 MG tablet Take 20 mg by mouth 2 (two) times daily.      Marland Kitchen LORazepam (ATIVAN) 0.5 MG tablet Take 1 mg by mouth 2 (two) times daily. 2 tabs in AM, 2 tabs in PM      . losartan (COZAAR) 100 MG tablet Take 1 tablet (100 mg total) by mouth daily.  90 tablet  3  . Multiple Vitamins-Minerals (MULTIVITAMIN WITH MINERALS) tablet Take 1 tablet by mouth daily.        . rosuvastatin (CRESTOR) 40 MG tablet Take 20 mg by mouth daily.       Marland Kitchen spironolactone (ALDACTONE) 25  MG tablet Take 1 tablet (25 mg total) by mouth daily.  90 tablet  3  . WELCHOL 3.75 G PACK Take 3.75 g by mouth daily.       Marland Kitchen zolpidem (AMBIEN) 10 MG tablet Take 1 tablet by mouth At bedtime.      Marland Kitchen DISCONTD: albuterol (PROAIR HFA) 108 (90 BASE) MCG/ACT inhaler Inhale 2 puffs into the lungs as needed.  1 Inhaler  3    Allergies: No Known Allergies  History  Substance Use Topics  . Smoking status: Former Smoker -- 1.5 packs/day for 20 years    Types: Cigarettes    Quit date: 07/21/2001  . Smokeless tobacco: Never Used  . Alcohol Use: No     ROS:  Please see the history of present illness.   All other systems reviewed and negative.   PHYSICAL EXAM: Filed Vitals:   04/14/12 0941  BP: 132/70  Pulse: 76  Weight: 223 lb (101.152 kg)  SpO2: 95%   General: Well nourished, well developed, in no acute distress HEENT: normal Neck: no JVD Cardiac:  PMI  laterally displaced. RRR; no murmur. No s3 Lungs:  clear to auscultation bilaterally, no wheezing, rhonchi or rales Abd: soft, obese. nontender, no hepatomegaly Ext: no edema Skin: warm and dry Neuro:  CNs 2-12 intact, no focal abnormalities noted   ASSESSMENT AND PLAN:

## 2012-04-14 NOTE — Patient Instructions (Addendum)
Your physician recommends that you schedule a follow-up appointment in: 2 months  

## 2012-05-17 ENCOUNTER — Encounter: Payer: Self-pay | Admitting: Internal Medicine

## 2012-05-17 ENCOUNTER — Ambulatory Visit (INDEPENDENT_AMBULATORY_CARE_PROVIDER_SITE_OTHER): Payer: BC Managed Care – PPO | Admitting: Internal Medicine

## 2012-05-17 VITALS — BP 129/74 | HR 77 | Ht 71.0 in | Wt 215.8 lb

## 2012-05-17 DIAGNOSIS — I428 Other cardiomyopathies: Secondary | ICD-10-CM

## 2012-05-17 DIAGNOSIS — I5022 Chronic systolic (congestive) heart failure: Secondary | ICD-10-CM

## 2012-05-17 LAB — ICD DEVICE OBSERVATION
AL IMPEDENCE ICD: 537.5 Ohm
DEV-0020ICD: NEGATIVE
HV IMPEDENCE: 78 Ohm
LV LEAD IMPEDENCE ICD: 1087.5 Ohm
MODE SWITCH EPISODES: 0
RV LEAD IMPEDENCE ICD: 487.5 Ohm
RV LEAD THRESHOLD: 0.875 V
TOT-0006: 20130726000000
TOT-0007: 1
TOT-0008: 0
TOT-0010: 2
TZON-0003SLOWVT: 350 ms
TZON-0004SLOWVT: 20
TZON-0010SLOWVT: 40 ms

## 2012-05-17 NOTE — Patient Instructions (Signed)
Your physician wants you to follow-up in: 9 months with Dr Allred You will receive a reminder letter in the mail two months in advance. If you don't receive a letter, please call our office to schedule the follow-up appointment.  

## 2012-05-17 NOTE — Progress Notes (Signed)
PCP: Cam Hai, CNM Primary Cardiologist:  Dr Gala Romney  Louis Juarez is a 71 y.o. male who presents today for routine electrophysiology followup.  Since having his BiV ICD implanted, the patient reports doing very well.  He is very pleased with his current health state.  His exercise tolerance and fatigue are much improved with CRT.   Today, he denies symptoms of palpitations, chest pain, shortness of breath,  lower extremity edema, dizziness, presyncope, syncope, or ICD shocks.  The patient is otherwise without complaint today.   Past Medical History  Diagnosis Date  . Chronic systolic heart failure   . HTN (hypertension)   . Hyperlipidemia   . Erectile dysfunction   . Insomnia   . DCM (dilated cardiomyopathy)   . CHF (congestive heart failure)   . 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 Cancer     basel cell on hand   Past Surgical History  Procedure Date  . Nasal sinus surgery 93, 97, 2010, 2006  . Cardiac defibrillator placement 02/13/2012    S[facility] Quadra [employer]ura BIV ICD implanted by Dr Johney Frame    Current Outpatient Prescriptions  Medication Sig Dispense Refill  . amitriptyline (ELAVIL) 100 MG tablet Take 100 mg by mouth at bedtime.        Marland Kitchen aspirin 81 MG tablet Take 81 mg by mouth daily.        . carvedilol (COREG) 25 MG tablet Take 1 tablet (25 mg total) by mouth 2 (two) times daily.  60 tablet  6  . digoxin (LANOXIN) 0.125 MG tablet Take 1 tablet (125 mcg total) by mouth daily.  90 tablet  3  . furosemide (LASIX) 20 MG tablet Take 20 mg by mouth 2 (two) times daily.      Marland Kitchen LORazepam (ATIVAN) 0.5 MG tablet Take 1 mg by mouth 2 (two) times daily. 2 tabs in AM, 2 tabs in PM      . losartan (COZAAR) 100 MG tablet Take 1 tablet (100 mg total) by mouth daily.  90 tablet  3  . Multiple Vitamins-Minerals (MULTIVITAMIN WITH MINERALS) tablet Take 1 tablet by mouth daily.        . rosuvastatin (CRESTOR) 40 MG tablet Take 20 mg by mouth daily.       Marland Kitchen  spironolactone (ALDACTONE) 25 MG tablet Take 1 tablet (25 mg total) by mouth daily.  90 tablet  3  . WELCHOL 3.75 G PACK Take 3.75 g by mouth daily.       Marland Kitchen zolpidem (AMBIEN) 10 MG tablet Take 1 tablet by mouth At bedtime.      Marland Kitchen DISCONTD: albuterol (PROAIR HFA) 108 (90 BASE) MCG/ACT inhaler Inhale 2 puffs into the lungs as needed.  1 Inhaler  3    Physical Exam: Filed Vitals:   05/17/12 1129  BP: 129/74  Pulse: 77  Height: 5\' 11"  (1.803 m)  Weight: 215 lb 12.8 oz (97.886 kg)  SpO2: 98%    GEN- The patient is well appearing, alert and oriented x 3 today.   Head- normocephalic, atraumatic Eyes-  Sclera clear, conjunctiva pink Ears- hearing intact Oropharynx- clear Lungs- Clear to ausculation bilaterally, normal work of breathing Chest- ICD pocket is well healed Heart- Regular rate and rhythm, no murmurs, rubs or gallops, PMI not laterally displaced GI- soft, NT, ND, + BS Extremities- no clubbing, cyanosis, or edema  ICD interrogation- reviewed in detail today,  See PACEART report  Assessment and Plan:  1.  Chronic systolic dysfunction euvolemic  today Stable on an appropriate medical regimen Clinically improved with CRT Normal BiV ICD function See Pace Art report No changes today  He will follow up with Dr Gala Romney I think that a repeat echo within the next few months to assess response to CRT is reasonable.  I will defer this to Dr Gala Romney. I will see again in 9 months

## 2012-05-24 ENCOUNTER — Encounter (HOSPITAL_COMMUNITY): Payer: Self-pay | Admitting: Internal Medicine

## 2012-05-28 ENCOUNTER — Other Ambulatory Visit (HOSPITAL_COMMUNITY): Payer: Self-pay | Admitting: Adult Health

## 2012-06-15 ENCOUNTER — Encounter (HOSPITAL_COMMUNITY): Payer: Medicare Other

## 2012-06-24 ENCOUNTER — Ambulatory Visit (HOSPITAL_COMMUNITY)
Admission: RE | Admit: 2012-06-24 | Discharge: 2012-06-24 | Disposition: A | Discharge status: Home / Self Care | Payer: BC Managed Care – PPO | Source: Ambulatory Visit | Attending: Internal Medicine | Admitting: Internal Medicine

## 2012-06-24 VITALS — BP 126/70 | HR 72 | Wt 217.5 lb

## 2012-06-24 DIAGNOSIS — I5022 Chronic systolic (congestive) heart failure: Secondary | ICD-10-CM

## 2012-06-24 NOTE — Patient Instructions (Addendum)
Your physician has requested that you have an echocardiogram. Echocardiography is a painless test that uses sound waves to create images of your heart. It provides your doctor with information about the size and shape of your heart and how well your heart's chambers and valves are working. This procedure takes approximately one hour. There are no restrictions for this procedure.  Follow up 3 months.

## 2012-06-24 NOTE — Progress Notes (Signed)
PCP:  Dr. Cam Hai Primary Cardiologist:  Dr. Charlton Haws  Primary Electrophysiologist:  None     History of Present Illness: Louis Juarez is a 71 y.o. male with h/o HTN, obesity, previous tobacco use (mild restriction on PFTs, no obstruction) and LBBB.  He has been referred to HF clinic by Dr. Eden Emms to establish ongoing care of newly diagnosed CHF.  He developed dyspnea/wheezing in summer of 2012. Referred to Dr. Shelle Iron. Mild airway disease.   Echocardiogram 06/30/11: EF 10-15%, inferior, septal, apical akinesis, mild LVH, severe LAE, mild RAE, moderately reduced RVSF, PASP 52.    Cath by Dr. Excell Seltzer. Mild non-obs CAD. LM 20-30%, LAD ok, LCx 30-40% RCA 30-40% prox 50% distal. LVEF 10-15%.  RA 16  RV 54/24  PA 52/24 with a mean of 38  PCWP 23  LV 104/28  AO 101/61 with a mean of 78  Oxygen saturations:  PA 58  AO 94  Cardiac Output (Fick) 4  Cardiac Index (Fick) 1.8  SVR 1240 PVR 3.75 Woods  F/u echo: 11/03/2011 EF 25-30%. At last visit we discussed ICD but we debated the possibility of him being Class I vs Class II.  CPX test:  4/13: Spirometry: normal Peak VO2: 17.7 ml/kg/min predicted peak VO2: 86.5% (corrected to ideal weight VO2 72ml/kg/min) VE/VCO2 slope: 31.1 OUES: 2.11 Peak RER: 1.20 Ventilatory Threshold: 12.0 % predicted peak VO2: 58.7% VE/MVV: 64.9% PETCO2 at peak: 35 O2pulse: 15 % predicted O2pulse: 100%  Echo 7/13: EF 15%  He returns for follow up today.  Just returned for [facility].  Walking treadmill 30-40 min.  212.2 this morning.  Breathing excellent.  No edema.  Walking up 15 steps 20 a day without difficulty.  No orthopnea/PND.    Lab work at Dr. Alver Fisher office in Nov showed normal liver function and renal per patient.  Pre-diabetic though.   Past Medical History  Diagnosis Date  . Chronic systolic heart failure   . HTN (hypertension)   . Hyperlipidemia   . Erectile dysfunction   . Insomnia   . DCM (dilated cardiomyopathy)   . CHF  (congestive heart failure)   . 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 amitriptyline (ELAVIL) 100 MG tablet Take 100 mg by mouth at bedtime.        Marland Kitchen aspirin 81 MG tablet Take 81 mg by mouth daily.        . carvedilol (COREG) 25 MG tablet Take 1 tablet (25 mg total) by mouth 2 (two) times daily.  60 tablet  6  . digoxin (LANOXIN) 0.125 MG tablet Take 1 tablet (125 mcg total) by mouth daily.  90 tablet  3  . furosemide (LASIX) 20 MG tablet Take 20 mg by mouth 2 (two) times daily.      Marland Kitchen LORazepam (ATIVAN) 0.5 MG tablet Take 1 mg by mouth 2 (two) times daily. 2 tabs in AM, 2 tabs in PM      . losartan (COZAAR) 100 MG tablet Take 1 tablet (100 mg total) by mouth daily.  90 tablet  3  . Multiple Vitamins-Minerals (MULTIVITAMIN WITH MINERALS) tablet Take 1 tablet by mouth daily.        . rosuvastatin (CRESTOR) 40 MG tablet Take 20 mg by mouth daily.       Marland Kitchen spironolactone (ALDACTONE) 25 MG tablet Take 1 tablet (25 mg total)  by mouth daily.  90 tablet  3  . WELCHOL 3.75 G PACK Take 3.75 g by mouth daily.       Marland Kitchen zolpidem (AMBIEN) 10 MG tablet Take 1 tablet by mouth At bedtime.      . [DISCONTINUED] albuterol (PROAIR HFA) 108 (90 BASE) MCG/ACT inhaler Inhale 2 puffs into the lungs as needed.  1 Inhaler  3  . [DISCONTINUED] digoxin (LANOXIN) 0.125 MG tablet TAKE 1 TABLET DAILY  90 tablet  3    Allergies: No Known Allergies   ROS:  Please see the history of present illness.   All other systems reviewed and negative.   PHYSICAL EXAM: Filed Vitals:   06/24/12 0848  BP: 126/70  Pulse: 72  Weight: 217 lb 8 oz (98.657 kg)  SpO2: 95%   General: Well nourished, well developed, in no acute distress HEENT: normal Neck: no JVD Cardiac:  PMI laterally displaced. RRR; no murmur. No s3 Lungs:  clear to auscultation bilaterally, no wheezing, rhonchi or rales Abd: soft, obese.  nontender, no hepatomegaly Ext: no edema Skin: warm and dry Neuro:  CNs 2-12 intact, no focal abnormalities noted   ASSESSMENT AND PLAN:

## 2012-06-26 NOTE — Assessment & Plan Note (Signed)
Patient seen and examined with Ulyess Blossom, PA-C. We discussed all aspects of the encounter. I agree with the assessment and plan as stated above.   Attending: Doing very well despite significant LV dysfunction. NYHA I. Weight up but volume status looks fine. Will continue current regimen and repeat echo. Reinforced need for daily weights and reviewed use of sliding scale diuretics.

## 2012-06-30 ENCOUNTER — Ambulatory Visit (HOSPITAL_COMMUNITY)
Admission: RE | Admit: 2012-06-30 | Discharge: 2012-06-30 | Disposition: A | Discharge status: Home / Self Care | Payer: BC Managed Care – PPO | Source: Ambulatory Visit | Attending: Internal Medicine | Admitting: Internal Medicine

## 2012-06-30 DIAGNOSIS — I252 Old myocardial infarction: Secondary | ICD-10-CM | POA: Insufficient documentation

## 2012-06-30 DIAGNOSIS — E119 Type 2 diabetes mellitus without complications: Secondary | ICD-10-CM | POA: Insufficient documentation

## 2012-06-30 DIAGNOSIS — I509 Heart failure, unspecified: Secondary | ICD-10-CM

## 2012-06-30 DIAGNOSIS — I5022 Chronic systolic (congestive) heart failure: Secondary | ICD-10-CM

## 2012-06-30 NOTE — Progress Notes (Signed)
  Echocardiogram 2D Echocardiogram has been performed.  Aaisha Sliter 06/30/2012, 10:54 AM

## 2012-08-09 ENCOUNTER — Other Ambulatory Visit (HOSPITAL_COMMUNITY): Payer: Self-pay | Admitting: Internal Medicine

## 2012-08-23 ENCOUNTER — Encounter: Payer: Self-pay | Admitting: Internal Medicine

## 2012-08-23 ENCOUNTER — Ambulatory Visit (INDEPENDENT_AMBULATORY_CARE_PROVIDER_SITE_OTHER): Payer: BC Managed Care – PPO | Admitting: *Deleted

## 2012-08-23 ENCOUNTER — Encounter: Payer: BC Managed Care – PPO | Admitting: *Deleted

## 2012-08-23 DIAGNOSIS — I5022 Chronic systolic (congestive) heart failure: Secondary | ICD-10-CM

## 2012-08-23 DIAGNOSIS — I428 Other cardiomyopathies: Secondary | ICD-10-CM

## 2012-08-23 LAB — ICD DEVICE OBSERVATION
AL THRESHOLD: 0.625 V
ATRIAL PACING ICD: 2.3 pct
BAMS-0001: 180 {beats}/min
BAMS-0003: 70 {beats}/min
CHARGE TIME: 8.9 s
DEV-0020ICD: NEGATIVE
MODE SWITCH EPISODES: 0
PACEART VT: 0
RV LEAD IMPEDENCE ICD: 525 Ohm
RV LEAD THRESHOLD: 1 V
TOT-0006: 20130726000000

## 2012-08-23 NOTE — Progress Notes (Signed)
ICD check with [employer] 

## 2012-09-01 ENCOUNTER — Other Ambulatory Visit (HOSPITAL_COMMUNITY): Payer: Self-pay | Admitting: Adult Health

## 2012-09-04 ENCOUNTER — Other Ambulatory Visit: Payer: Self-pay

## 2012-09-23 ENCOUNTER — Ambulatory Visit (HOSPITAL_COMMUNITY)
Admission: RE | Admit: 2012-09-23 | Discharge: 2012-09-23 | Disposition: A | Discharge status: Home / Self Care | Payer: BC Managed Care – PPO | Source: Ambulatory Visit | Attending: Internal Medicine | Admitting: Internal Medicine

## 2012-09-23 ENCOUNTER — Encounter (HOSPITAL_COMMUNITY): Payer: Self-pay

## 2012-09-23 VITALS — BP 136/74 | HR 77 | Wt 220.0 lb

## 2012-09-23 DIAGNOSIS — I5022 Chronic systolic (congestive) heart failure: Secondary | ICD-10-CM

## 2012-09-23 MED ORDER — DIGOXIN 125 MCG PO TABS: 0.1250 mg | ORAL_TABLET | Freq: Every day | ORAL | Status: DC

## 2012-09-23 NOTE — Patient Instructions (Addendum)
Follow up in 6 months  Do the following things EVERYDAY: 1) Weigh yourself in the morning before breakfast. Write it down and keep it in a log. 2) Take your medicines as prescribed 3) Eat low salt foods-Limit salt (sodium) to 2000 mg per day.  4) Stay as active as you can everyday 5) Limit all fluids for the day to less than 2 liters 

## 2012-09-23 NOTE — Assessment & Plan Note (Addendum)
NYHA I. Volume status stable. Continue current regimen. Follow up in 6 months.

## 2012-09-23 NOTE — Progress Notes (Signed)
Patient ID: Louis Juarez, male   DOB: 27-Jul-1940, 72 y.o.   MRN: [id number] PCP:  Dr. Cam Hai Primary Cardiologist:  Dr. Charlton Haws  Primary Electrophysiologist: Dr Excell Seltzer     History of Present Illness: Louis Juarez is a 72 y.o. male with h/o HTN, obesity, previous tobacco use (mild restriction on PFTs, no obstruction) and LBBB.  S/P St Jude 01/2012.  He developed dyspnea/wheezing in summer of 2012. Referred to Dr. Shelle Iron. Mild airway disease.   Echocardiogram 06/30/11: EF 10-15%, inferior, septal, apical akinesis, mild LVH, severe LAE, mild RAE, moderately reduced RVSF, PASP 52.    Cath by Dr. Excell Seltzer. Mild non-obs CAD. LM 20-30%, LAD ok, LCx 30-40% RCA 30-40% prox 50% distal. LVEF 10-15%.  RA 16  RV 54/24  PA 52/24 with a mean of 38  PCWP 23  LV 104/28  AO 101/61 with a mean of 78  Oxygen saturations:  PA 58  AO 94  Cardiac Output (Fick) 4  Cardiac Index (Fick) 1.8  SVR 1240 PVR 3.75 Woods  F/u echo: 11/03/2011 EF 25-30%. At last visit we discussed ICD but we debated the possibility of him being Class I vs Class II.  CPX test:  4/13: Spirometry: normal Peak VO2: 17.7 ml/kg/min predicted peak VO2: 86.5% (corrected to ideal weight VO2 33ml/kg/min) VE/VCO2 slope: 31.1 OUES: 2.11 Peak RER: 1.20 Ventilatory Threshold: 12.0 % predicted peak VO2: 58.7% VE/MVV: 64.9% PETCO2 at peak: 35 O2pulse: 15 % predicted O2pulse: 100%  Echo 7/13: EF 15% ECHO 06/30/2012 EF 25-30%  He returns for follow up today.  Denies SOB/PND/Orthopnea. No shocks. Able walk up steps without difficulty.  He exercises daily at least 1 hour per day. Weight at home 212-214 pounds. Compliant with medications. Dr Clelia Croft follows for cholesterol. Follows low salt diet.   Past Medical History  Diagnosis Date  . Chronic systolic heart failure   . HTN (hypertension)   . Hyperlipidemia   . Erectile dysfunction   . Insomnia   . DCM (dilated cardiomyopathy)   . CHF (congestive heart failure)   . ICD  (implantable cardiac defibrillator) in place 02/13/2012  . 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 amitriptyline (ELAVIL) 100 MG tablet Take 100 mg by mouth at bedtime.        Marland Kitchen aspirin 81 MG tablet Take 81 mg by mouth daily.        . carvedilol (COREG) 25 MG tablet TAKE 1 TABLET TWICE A DAY  180 tablet  3  . furosemide (LASIX) 20 MG tablet Take 20 mg by mouth 2 (two) times daily.      Marland Kitchen LORazepam (ATIVAN) 0.5 MG tablet Take 1 mg by mouth 2 (two) times daily. 2 tabs in AM, 2 tabs in PM      . losartan (COZAAR) 100 MG tablet Take 1 tablet (100 mg total) by mouth daily.  90 tablet  3  . Multiple Vitamins-Minerals (MULTIVITAMIN WITH MINERALS) tablet Take 1 tablet by mouth daily.        . rosuvastatin (CRESTOR) 40 MG tablet Take 20 mg by mouth daily.       Marland Kitchen spironolactone (ALDACTONE) 25 MG tablet Take 1 tablet (25 mg total) by mouth daily.  90 tablet  3  . WELCHOL 3.75 G PACK Take 3.75 g by mouth daily.       Marland Kitchen zolpidem (AMBIEN) 10 MG tablet Take 1 tablet by mouth At bedtime.      Marland Kitchen  digoxin (LANOXIN) 0.125 MG tablet Take 1 tablet (125 mcg total) by mouth daily.  90 tablet  3  . digoxin (LANOXIN) 0.125 MG tablet Take 1 tablet (0.125 mg total) by mouth daily.  90 tablet  3  . [DISCONTINUED] albuterol (PROAIR HFA) 108 (90 BASE) MCG/ACT inhaler Inhale 2 puffs into the lungs as needed.  1 Inhaler  3   No current facility-administered medications for this encounter.    Allergies: No Known Allergies   ROS:  Please see the history of present illness.   All other systems reviewed and negative.   PHYSICAL EXAM: Filed Vitals:   09/23/12 0851  BP: 136/74  Pulse: 77  Weight: 220 lb (99.791 kg)  SpO2: 96%   General: Well nourished, well developed, in no acute distress HEENT: normal Neck: no JVD Cardiac:  PMI laterally displaced. RRR; no murmur. No s3 Lungs:  clear to auscultation bilaterally, no wheezing, rhonchi or rales Abd:  soft, obese. nontender, no hepatomegaly Ext: no edema Skin: warm and dry Neuro:  CNs 2-12 intact, no focal abnormalities noted   ASSESSMENT AND PLAN:

## 2012-11-22 ENCOUNTER — Encounter: Payer: Self-pay | Admitting: Internal Medicine

## 2012-11-22 ENCOUNTER — Ambulatory Visit (INDEPENDENT_AMBULATORY_CARE_PROVIDER_SITE_OTHER): Payer: BC Managed Care – PPO | Admitting: *Deleted

## 2012-11-22 ENCOUNTER — Other Ambulatory Visit: Payer: Self-pay | Admitting: Internal Medicine

## 2012-11-22 DIAGNOSIS — I428 Other cardiomyopathies: Secondary | ICD-10-CM

## 2012-11-22 DIAGNOSIS — Z95810 Presence of automatic (implantable) cardiac defibrillator: Secondary | ICD-10-CM

## 2012-11-22 DIAGNOSIS — I5022 Chronic systolic (congestive) heart failure: Secondary | ICD-10-CM

## 2012-11-23 LAB — REMOTE ICD DEVICE
AL IMPEDENCE ICD: 600 Ohm
ATRIAL PACING ICD: 1.2 pct
BAMS-0001: 180 {beats}/min
BAMS-0003: 70 {beats}/min
DEVICE MODEL ICD: 7021876
HV IMPEDENCE: 86 Ohm
LV LEAD IMPEDENCE ICD: 1175 Ohm
TZON-0003SLOWVT: 350 ms
TZON-0004SLOWVT: 20
TZON-0005SLOWVT: 6
VENTRICULAR PACING ICD: 98 pct

## 2012-12-06 ENCOUNTER — Other Ambulatory Visit (HOSPITAL_COMMUNITY): Payer: Self-pay | Admitting: Internal Medicine

## 2012-12-08 ENCOUNTER — Encounter: Payer: Self-pay | Admitting: *Deleted

## 2013-01-31 ENCOUNTER — Encounter: Payer: Self-pay | Admitting: Internal Medicine

## 2013-01-31 ENCOUNTER — Ambulatory Visit (INDEPENDENT_AMBULATORY_CARE_PROVIDER_SITE_OTHER): Payer: BC Managed Care – PPO | Admitting: Internal Medicine

## 2013-01-31 VITALS — BP 138/72 | HR 75 | Ht 70.5 in | Wt 214.2 lb

## 2013-01-31 DIAGNOSIS — I428 Other cardiomyopathies: Secondary | ICD-10-CM

## 2013-01-31 DIAGNOSIS — I5022 Chronic systolic (congestive) heart failure: Secondary | ICD-10-CM

## 2013-01-31 DIAGNOSIS — Z95810 Presence of automatic (implantable) cardiac defibrillator: Secondary | ICD-10-CM

## 2013-01-31 DIAGNOSIS — I10 Essential (primary) hypertension: Secondary | ICD-10-CM

## 2013-01-31 LAB — ICD DEVICE OBSERVATION
AL IMPEDENCE ICD: 560 Ohm
AL THRESHOLD: 0.625 V
ATRIAL PACING ICD: 1.2 pct
BAMS-0001: 180 {beats}/min
BAMS-0003: 70 {beats}/min
LV LEAD IMPEDENCE ICD: 1125 Ohm
LV LEAD THRESHOLD: 1.25 V
RV LEAD THRESHOLD: 1 V

## 2013-01-31 NOTE — Progress Notes (Signed)
Primary Cardiologist: Nicholes Mango, MD  Louis Juarez is a 72 y.o. male who presents today for routine electrophysiology followup.  Since last being seen in our clinic, the patient reports doing very well.  He is very active walking 2 miles a day on the treadmill and gardening.  He is followed closely in the CHF clinic by Dr Gala Romney.  Today, he denies symptoms of palpitations, chest pain, shortness of breath,  lower extremity edema, dizziness, presyncope, syncope, or ICD shocks.  The patient is otherwise without complaint today.   Past Medical History  Diagnosis Date  . Chronic systolic heart failure   . HTN (hypertension)   . Hyperlipidemia   . Erectile dysfunction   . Insomnia   . DCM (dilated cardiomyopathy)   . CHF (congestive heart failure)   . 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 Nasal sinus surgery  93, 97, 2010, 2006  . Cardiac defibrillator placement  02/13/2012    SJM Quadra Assura BIV ICD implanted by Dr Johney Frame    Current Outpatient Prescriptions  Medication Sig Dispense Refill  . amitriptyline (ELAVIL) 100 MG tablet Take 100 mg by mouth at bedtime.        Marland Kitchen aspirin 81 MG tablet Take 81 mg by mouth daily.        . carvedilol (COREG) 25 MG tablet TAKE 1 TABLET TWICE A DAY  180 tablet  3  . digoxin (LANOXIN) 0.125 MG tablet Take 1 tablet (0.125 mg total) by mouth daily.  90 tablet  3  . furosemide (LASIX) 20 MG tablet Take 20 mg by mouth 2 (two) times daily.      Marland Kitchen LORazepam (ATIVAN) 0.5 MG tablet Take 1 mg by mouth 2 (two) times daily. 2 tabs in AM, 2 tabs in PM      . losartan (COZAAR) 100 MG tablet Take 100 mg by mouth daily.      . Multiple Vitamins-Minerals (MULTIVITAMIN WITH MINERALS) tablet Take 1 tablet by mouth daily.        . rosuvastatin (CRESTOR) 40 MG tablet Take 20 mg by mouth daily.       Marland Kitchen spironolactone (ALDACTONE) 25 MG tablet TAKE 1 TABLET  DAILY  90 tablet  3  . WELCHOL 3.75 G PACK Take 3.75 g by mouth daily.       Marland Kitchen zolpidem (AMBIEN) 10 MG tablet Take 1 tablet by mouth At bedtime.      . [DISCONTINUED] albuterol (PROAIR HFA) 108 (90 BASE) MCG/ACT inhaler Inhale 2 puffs into the lungs as needed.  1 Inhaler  3   No current facility-administered medications for this visit.    Physical Exam: Filed Vitals:   01/31/13 0902  BP: 138/72  Pulse: 75  Height: 5' 10.5" (1.791 m)  Weight: 214 lb 3.2 oz (97.16 kg)    GEN- The patient is well appearing, alert and oriented x 3 today.   Head- normocephalic, atraumatic Eyes-  Sclera clear, conjunctiva pink Ears- hearing intact Oropharynx- clear Lungs- Clear to ausculation bilaterally, normal work of breathing Chest- ICD pocket is well healed Heart- Regular rate and rhythm, no murmurs, rubs or gallops, PMI not laterally displaced GI- soft, NT, ND, + BS Extremities- no clubbing, cyanosis, or edema  ICD interrogation- reviewed in detail today,  See PACEART report ekg today reveals sinus   Assessment and Plan:  1. Chronic systolic dysfunction Much  improved with CRT Normal BiV ICD function See Pace Art report No changes today Would repeat echo upon follow-up with CHF to further assess response to CRT  2. HTN Stable No change required today  Merlin checks Return in 1 year

## 2013-01-31 NOTE — Patient Instructions (Addendum)
Remote monitoring is used to monitor your Pacemaker of ICD from home. This monitoring reduces the number of office visits required to check your device to one time per year. It allows [place] to keep an eye on the functioning of your device to ensure it is working properly. You are scheduled for a device check from home on May 02, 2013. You may send your transmission at any time that day. If you have a wireless device, the transmission will be sent automatically. After your physician reviews your transmission, you will receive a postcard with your next transmission date.  Your physician wants you to follow-up in: 1 year with Dr Johney Frame.  You will receive a reminder letter in the mail two months in advance. If you don't receive a letter, please call our office to schedule the follow-up appointment.

## 2013-02-23 ENCOUNTER — Other Ambulatory Visit: Payer: Self-pay

## 2013-03-30 ENCOUNTER — Ambulatory Visit (HOSPITAL_COMMUNITY)
Admission: RE | Admit: 2013-03-30 | Discharge: 2013-03-30 | Disposition: A | Discharge status: Home / Self Care | Payer: BC Managed Care – PPO | Source: Ambulatory Visit | Attending: Internal Medicine | Admitting: Internal Medicine

## 2013-03-30 VITALS — BP 120/60 | HR 75 | Wt 211.4 lb

## 2013-03-30 DIAGNOSIS — I428 Other cardiomyopathies: Secondary | ICD-10-CM | POA: Insufficient documentation

## 2013-03-30 DIAGNOSIS — I5022 Chronic systolic (congestive) heart failure: Secondary | ICD-10-CM | POA: Insufficient documentation

## 2013-03-30 NOTE — Patient Instructions (Addendum)
Doing great. Continue current medications.  We will schedule you for cardiac ultrasound to reassess your heart function.   We will contact you in 4 months to schedule your next appointment.

## 2013-03-30 NOTE — Addendum Note (Signed)
Encounter addended by: Noralee Space, RN on: 03/30/2013 12:45 PM<BR>     Documentation filed: Patient Instructions Section, Orders

## 2013-03-30 NOTE — Progress Notes (Signed)
Patient ID: Louis Juarez, male   DOB: May 14, 1941, 72 y.o.   MRN: [id number] PCP:  Dr. Cam Hai Primary Cardiologist:  Dr. Charlton Haws  Primary Electrophysiologist:  None     History of Present Illness: Tyreon Frigon is a 72 y.o. male with h/o HTN, obesity, previous tobacco use (mild restriction on PFTs, no obstruction) and LBBB.  S/p St Jude CRT-D (7/13 followed by Dr. Johney Frame)   He developed dyspnea/wheezing in summer of 2012. Referred to Dr. Shelle Iron. Mild airway disease.   Echocardiogram 06/30/11: EF 10-15%, inferior, septal, apical akinesis, mild LVH, severe LAE, mild RAE, moderately reduced RVSF, PASP 52.  Cath by Dr. Excell Seltzer. Mild non-obs CAD. LM 20-30%, LAD ok, LCx 30-40% RCA 30-40% prox 50% distal. LVEF 10-15%.  CPX test:  4/13: Spirometry: normal Peak VO2: 17.7 ml/kg/min predicted peak VO2: 86.5% (corrected to ideal weight VO2 34ml/kg/min) VE/VCO2 slope: 31.1 OUES: 2.11 Peak RER: 1.20 Ventilatory Threshold: 12.0 % predicted peak VO2: 58.7% VE/MVV: 64.9% PETCO2 at peak: 35 O2pulse: 15 % predicted O2pulse: 100%  Echo 7/13: EF 15%  Echo 12/13: 25-30%  He returns for follow up today.  Feels great. Walking 2 miles on the treadmill 7 days/week @ 3.4 miles/hour. No SOB, CP, orthopnea or PND. No edem.a     Past Medical History  Diagnosis Date  . Chronic systolic heart failure   . HTN (hypertension)   . Hyperlipidemia   . Erectile dysfunction   . Insomnia   . DCM (dilated cardiomyopathy)   . CHF (congestive heart failure)   . 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 amitriptyline (ELAVIL) 100 MG tablet Take 100 mg by mouth at bedtime.        Marland Kitchen aspirin 81 MG tablet Take 81 mg by mouth daily.        . carvedilol (COREG) 25 MG tablet TAKE 1 TABLET TWICE A DAY  180 tablet  3  . digoxin (LANOXIN) 0.125 MG tablet Take 1 tablet (0.125 mg total) by mouth  daily.  90 tablet  3  . furosemide (LASIX) 20 MG tablet Take 20 mg by mouth 2 (two) times daily.      Marland Kitchen LORazepam (ATIVAN) 0.5 MG tablet Take 1 mg by mouth 2 (two) times daily. 2 tabs in AM, 2 tabs in PM      . losartan (COZAAR) 100 MG tablet Take 100 mg by mouth daily.      . Multiple Vitamins-Minerals (MULTIVITAMIN WITH MINERALS) tablet Take 1 tablet by mouth daily.        . rosuvastatin (CRESTOR) 40 MG tablet Take 20 mg by mouth daily.       Marland Kitchen spironolactone (ALDACTONE) 25 MG tablet TAKE 1 TABLET DAILY  90 tablet  3  . WELCHOL 3.75 G PACK Take 3.75 g by mouth daily.       Marland Kitchen zolpidem (AMBIEN) 10 MG tablet Take 1 tablet by mouth At bedtime.      . [DISCONTINUED] albuterol (PROAIR HFA) 108 (90 BASE) MCG/ACT inhaler Inhale 2 puffs into the lungs as needed.  1 Inhaler  3   No current facility-administered medications for this encounter.    Allergies: No Known Allergies   ROS:  Please see the history of present illness.   All other systems reviewed and negative.   PHYSICAL EXAM: Filed Vitals:   03/30/13 1202  BP: 120/60  Pulse: 75  Weight: 211 lb 6.4 oz (95.89 kg)  SpO2: 98%   General: Well nourished, well developed, in no acute distress HEENT: normal Neck: no JVD Cardiac:  PMI laterally displaced. RRR; no murmur. No s3 Lungs:  clear to auscultation bilaterally, no wheezing, rhonchi or rales Abd: soft, obese. nontender, no hepatomegaly Ext: no edema Skin: warm and dry Neuro:  CNs 2-12 intact, no focal abnormalities noted   ASSESSMENT AND PLAN:  1. Chronic systolic HF, NYHA I 2. NICM s/p St Jude BiVICD 3. Nonobstructive CAD  Doing just great. NYHA I. Volume status looks good. Following weights closely. Exercising regularly. At goal doses for meds. Due for repeat echo to reassess LV function.   Anothy Bufano,MD 12:40 PM

## 2013-04-12 ENCOUNTER — Ambulatory Visit (HOSPITAL_COMMUNITY)
Admission: RE | Admit: 2013-04-12 | Discharge: 2013-04-12 | Disposition: A | Discharge status: Home / Self Care | Payer: BC Managed Care – PPO | Source: Ambulatory Visit | Attending: Internal Medicine | Admitting: Internal Medicine

## 2013-04-12 DIAGNOSIS — I5022 Chronic systolic (congestive) heart failure: Secondary | ICD-10-CM

## 2013-04-12 DIAGNOSIS — I10 Essential (primary) hypertension: Secondary | ICD-10-CM | POA: Insufficient documentation

## 2013-04-12 DIAGNOSIS — I517 Cardiomegaly: Secondary | ICD-10-CM

## 2013-04-12 DIAGNOSIS — I509 Heart failure, unspecified: Secondary | ICD-10-CM | POA: Insufficient documentation

## 2013-04-12 NOTE — Progress Notes (Signed)
  Echocardiogram 2D Echocardiogram has been performed.  Louis Juarez 04/12/2013, 10:46 AM

## 2013-05-02 ENCOUNTER — Ambulatory Visit (INDEPENDENT_AMBULATORY_CARE_PROVIDER_SITE_OTHER): Payer: BC Managed Care – PPO | Admitting: *Deleted

## 2013-05-02 DIAGNOSIS — I5022 Chronic systolic (congestive) heart failure: Secondary | ICD-10-CM

## 2013-05-02 DIAGNOSIS — Z95810 Presence of automatic (implantable) cardiac defibrillator: Secondary | ICD-10-CM

## 2013-05-02 DIAGNOSIS — I428 Other cardiomyopathies: Secondary | ICD-10-CM

## 2013-05-04 LAB — REMOTE ICD DEVICE
AL THRESHOLD: 0.5 V
ATRIAL PACING ICD: 2.8 pct
BAMS-0003: 70 {beats}/min
DEV-0020ICD: NEGATIVE
DEVICE MODEL ICD: 7021876
LV LEAD IMPEDENCE ICD: 1075 Ohm
TZON-0003SLOWVT: 350 ms
TZON-0005SLOWVT: 6
VENTRICULAR PACING ICD: 97 pct

## 2013-05-11 ENCOUNTER — Encounter: Payer: Self-pay | Admitting: Internal Medicine

## 2013-05-23 ENCOUNTER — Other Ambulatory Visit (HOSPITAL_COMMUNITY): Payer: Self-pay | Admitting: Internal Medicine

## 2013-05-26 ENCOUNTER — Other Ambulatory Visit: Payer: Self-pay

## 2013-05-30 ENCOUNTER — Encounter: Payer: Self-pay | Admitting: Internal Medicine

## 2013-06-15 ENCOUNTER — Encounter: Payer: Self-pay | Admitting: Internal Medicine

## 2013-06-23 ENCOUNTER — Other Ambulatory Visit (HOSPITAL_COMMUNITY): Payer: Self-pay | Admitting: *Deleted

## 2013-06-23 MED ORDER — LOSARTAN POTASSIUM 100 MG PO TABS: 100.0000 mg | ORAL_TABLET | Freq: Every day | ORAL | Status: DC

## 2013-07-27 ENCOUNTER — Other Ambulatory Visit (HOSPITAL_COMMUNITY): Payer: Self-pay | Admitting: Adult Health

## 2013-08-02 ENCOUNTER — Encounter: Payer: BC Managed Care – PPO | Admitting: *Deleted

## 2013-08-02 DIAGNOSIS — I428 Other cardiomyopathies: Secondary | ICD-10-CM

## 2013-08-02 DIAGNOSIS — Z95810 Presence of automatic (implantable) cardiac defibrillator: Secondary | ICD-10-CM

## 2013-08-02 DIAGNOSIS — I5022 Chronic systolic (congestive) heart failure: Secondary | ICD-10-CM

## 2013-08-02 LAB — MDC_IDC_ENUM_SESS_TYPE_REMOTE
HIGH POWER IMPEDANCE MEASURED VALUE: 82 Ohm
Implantable Pulse Generator Model: 3265
Lead Channel Impedance Value: 1100 Ohm
Lead Channel Impedance Value: 410 Ohm
Lead Channel Pacing Threshold Amplitude: 0.5 V
Lead Channel Pacing Threshold Amplitude: 1.5 V
Lead Channel Pacing Threshold Pulse Width: 0.5 ms
Lead Channel Pacing Threshold Pulse Width: 0.5 ms
Lead Channel Pacing Threshold Pulse Width: 0.5 ms
Lead Channel Setting Pacing Amplitude: 2.125
Lead Channel Setting Pacing Amplitude: 2.25 V
Lead Channel Setting Pacing Pulse Width: 0.5 ms
MDC IDC MSMT LEADCHNL LV PACING THRESHOLD AMPLITUDE: 1.25 V
MDC IDC MSMT LEADCHNL RA IMPEDANCE VALUE: 530 Ohm
MDC IDC MSMT LEADCHNL RA SENSING INTR AMPL: 1 mV
MDC IDC MSMT LEADCHNL RV SENSING INTR AMPL: 12 mV — AB
MDC IDC PG SERIAL: 7021876
MDC IDC SET LEADCHNL LV PACING PULSEWIDTH: 0.5 ms
MDC IDC SET LEADCHNL RA PACING AMPLITUDE: 1.5 V
MDC IDC SET LEADCHNL RV SENSING SENSITIVITY: 0.5 mV
MDC IDC SET ZONE DETECTION INTERVAL: 280 ms
MDC IDC SET ZONE DETECTION INTERVAL: 350 ms
MDC IDC STAT BRADY RA PERCENT PACED: 2.1 %

## 2013-08-12 ENCOUNTER — Encounter (HOSPITAL_COMMUNITY): Payer: BC Managed Care – PPO

## 2013-08-15 ENCOUNTER — Encounter: Payer: Self-pay | Admitting: *Deleted

## 2013-08-16 ENCOUNTER — Ambulatory Visit (HOSPITAL_COMMUNITY)
Admission: RE | Admit: 2013-08-16 | Discharge: 2013-08-16 | Disposition: A | Discharge status: Home / Self Care | Payer: BC Managed Care – PPO | Source: Ambulatory Visit | Attending: Internal Medicine | Admitting: Internal Medicine

## 2013-08-16 VITALS — BP 104/66 | HR 68 | Wt 213.0 lb

## 2013-08-16 DIAGNOSIS — I2510 Atherosclerotic heart disease of native coronary artery without angina pectoris: Secondary | ICD-10-CM | POA: Insufficient documentation

## 2013-08-16 DIAGNOSIS — I5022 Chronic systolic (congestive) heart failure: Secondary | ICD-10-CM

## 2013-08-16 DIAGNOSIS — I10 Essential (primary) hypertension: Secondary | ICD-10-CM | POA: Insufficient documentation

## 2013-08-16 MED ORDER — LOSARTAN POTASSIUM 100 MG PO TABS: 100.0000 mg | ORAL_TABLET | Freq: Every day | ORAL | Status: DC

## 2013-08-16 MED ORDER — LOSARTAN POTASSIUM 100 MG PO TABS
ORAL_TABLET | ORAL | Status: DC
Start: 2013-08-16 — End: 2013-12-09

## 2013-08-16 NOTE — Patient Instructions (Signed)
Doing great.   Will increase your losartan to 150 mg daily. You can take 100 mg (1 tablet) in the morning and 50 mg (1/2 tablet) in the evenings. Call any issues with dizziness.  Will need to go to [place] next in 7-10 days to check labs.  In 3-4 months get labs checked.   Follow up in 6 months  Do the following things EVERYDAY: 1) Weigh yourself in the morning before breakfast. Write it down and keep it in a log. 2) Take your medicines as prescribed 3) Eat low salt foods-Limit salt (sodium) to 2000 mg per day.  4) Stay as active as you can everyday 5) Limit all fluids for the day to less than 2 liters 6)

## 2013-08-16 NOTE — Progress Notes (Signed)
Patient ID: Louis Abdallah., male   DOB: 13-Sep-1940, 73 y.o.   MRN: [id number]  PCP:  Dr. Serita Grammes Primary Cardiologist:  Dr. Jenkins Rouge (will not go back and see) Primary Electrophysiologist:  Dr. Rayann Heman    History of Present Illness: Louis Gallery. is a 73 y.o. male with h/o HTN, obesity, previous tobacco use (mild restriction on PFTs, no obstruction) and LBBB.  S/p St Jude CRT-D (7/13 followed by Dr. Rayann Heman)   He developed dyspnea/wheezing in summer of 2012. Referred to Dr. Gwenette Greet. Mild airway disease. Echocardiogram 06/30/11: EF 10-15%, inferior, septal, apical akinesis, mild LVH, severe LAE, mild RAE, moderately reduced RVSF, PASP 52.  Cath by Dr. Burt Knack. Mild non-obs CAD. LM 20-30%, LAD ok, LCx 30-40% RCA 30-40% prox 50% distal. LVEF 10-15%.  CPX test:  4/13: Spirometry: normal Peak VO2: 17.7 ml/kg/min predicted peak VO2: 86.5% (corrected to ideal weight VO2 34ml/kg/min) VE/VCO2 slope: 31.1 OUES: 2.11 Peak RER: 1.20 Ventilatory Threshold: 12.0 % predicted peak VO2: 58.7% VE/MVV: 64.9% PETCO2 at peak: 35 O2pulse: 15 % predicted O2pulse: 100%  Echo 7/13: EF 15%  Echo 12/13: 25-30% Echo 03/2013: EF 40-45%  Follow up: Doing very well. Still very active walks on treadmill 2- 2 1/2 miles every days with no issues. No SOB, CP, PND or edema. Has lost significant amount of weight. Got lightheaded once last week with standing up quickly. Just got back from sailing trip to [facility]. Following low salt diet. Drinking a little more than 2L a day.   Labs (10/14): K 4.7, creatinine 0.96  Past Medical History  Diagnosis Date  . Chronic systolic heart failure   . HTN (hypertension)   . Hyperlipidemia   . Erectile dysfunction   . Insomnia   . DCM (dilated cardiomyopathy)   . CHF (congestive heart failure)   . 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 Cancer     basel cell on hand    Current Outpatient Prescriptions  Medication Sig Dispense  Refill  . amitriptyline (ELAVIL) 100 MG tablet Take 100 mg by mouth at bedtime.        Marland Kitchen aspirin 81 MG tablet Take 81 mg by mouth daily.        . carvedilol (COREG) 25 MG tablet TAKE 1 TABLET TWICE A DAY  180 tablet  3  . digoxin (LANOXIN) 0.125 MG tablet Take 1 tablet (0.125 mg total) by mouth daily.  90 tablet  3  . furosemide (LASIX) 20 MG tablet Take 1 tablet (20 mg total) by mouth 2 (two) times daily.  180 tablet  3  . LORazepam (ATIVAN) 0.5 MG tablet Take 1 mg by mouth 2 (two) times daily. 2 tabs in AM, 2 tabs in PM      . losartan (COZAAR) 100 MG tablet Take 1 tablet (100 mg total) by mouth daily.  90 tablet  3  . Multiple Vitamins-Minerals (MULTIVITAMIN WITH MINERALS) tablet Take 1 tablet by mouth daily.        . rosuvastatin (CRESTOR) 40 MG tablet Take 20 mg by mouth daily.       Marland Kitchen spironolactone (ALDACTONE) 25 MG tablet TAKE 1 TABLET DAILY  90 tablet  3  . WELCHOL 3.75 G PACK Take 3.75 g by mouth daily.       Marland Kitchen zolpidem (AMBIEN) 10 MG tablet Take 1 tablet by mouth At bedtime.      . [DISCONTINUED] albuterol (PROAIR HFA) 108 (90 BASE) MCG/ACT inhaler Inhale 2  puffs into the lungs as needed.  1 Inhaler  3   No current facility-administered medications for this encounter.    Allergies: No Known Allergies  ROS:  Please see the history of present illness.   All other systems reviewed and negative.   Filed Vitals:   08/16/13 0841  BP: 104/66  Pulse: 68  Weight: 213 lb (96.616 kg)  SpO2: 97%   PHYSICAL EXAM: General: Well nourished, well developed, in no acute distress HEENT: normal Neck: no JVD Cardiac:  PMI laterally displaced. RRR; no murmur. No s3 Lungs:  clear to auscultation bilaterally, no wheezing, rhonchi or rales Abd: soft, obese. nontender, no hepatomegaly Ext: no edema Skin: warm and dry Neuro:  CNs 2-12 intact, no focal abnormalities noted  ASSESSMENT AND PLAN:   1. Chronic systolic HF: NICM, EF 35-36% (03/2013) s/p St Jude Biv ICD.  NYHA II symptoms and  volume status stable.  - Will continue lasix 20 mg BID and spiro 25 mg daily. - At goal dose BB, coreg 25 mg BID. Will try to increase losartan to 150 mg daily (100 mg q apm and 50 mg q pm).  - Continue digoxin 0.125 mg daily. - Check BMET and dig level next week.  - Reinforced the need and importance of daily weights, a low sodium diet, and fluid restriction (less than 2 L a day). Instructed to call the HF clinic if weight increases more than 3 lbs overnight or 5 lbs in a week.  2. Nonobstructive CAD: no s/s of ischemia. Continue ASA, statin and BB.  3. HTN: Controlled on current regimen. As above will increase losartan for LV dysfunction   3-4 months get BMET checked and F/U 6 months  Louis Juarez B NP-C 8:50 AM  Patient seen with NP, agree with the above note.  He is doing well overall, NYHA class II symptoms.  We will increase losartan to 100 qm/50 qpm.  Other meds will continue at current doses.  BMET/digoxin level in 1 week.   Louis Juarez 08/16/2013

## 2013-08-17 ENCOUNTER — Encounter: Payer: Self-pay | Admitting: Internal Medicine

## 2013-08-29 ENCOUNTER — Ambulatory Visit (INDEPENDENT_AMBULATORY_CARE_PROVIDER_SITE_OTHER): Payer: BC Managed Care – PPO | Admitting: *Deleted

## 2013-08-29 DIAGNOSIS — I10 Essential (primary) hypertension: Secondary | ICD-10-CM

## 2013-08-29 LAB — BASIC METABOLIC PANEL
BUN: 18 mg/dL (ref 6–23)
CO2: 28 mEq/L (ref 19–32)
CREATININE: 0.9 mg/dL (ref 0.4–1.5)
Calcium: 9 mg/dL (ref 8.4–10.5)
Chloride: 101 mEq/L (ref 96–112)
GFR: 85.64 mL/min (ref >60.00)
Glucose, Bld: 120 mg/dL — ABNORMAL HIGH (ref 70–99)
POTASSIUM: 4 meq/L (ref 3.5–5.1)
Sodium: 137 mEq/L (ref 135–145)

## 2013-10-06 ENCOUNTER — Telehealth: Payer: Self-pay | Admitting: *Deleted

## 2013-10-06 ENCOUNTER — Other Ambulatory Visit (HOSPITAL_COMMUNITY): Payer: Self-pay

## 2013-10-06 MED ORDER — DIGOXIN 125 MCG PO TABS: 0.1250 mg | ORAL_TABLET | Freq: Every day | ORAL | Status: DC

## 2013-10-06 NOTE — Telephone Encounter (Signed)
PA to [facility] for losartan

## 2013-10-10 NOTE — Telephone Encounter (Signed)
From [facility] regarding PA for losartan, no prior authorization require, refill can be done 10/27/2013, called [facility] with this information

## 2013-10-11 ENCOUNTER — Ambulatory Visit
Admission: RE | Admit: 2013-10-11 | Discharge: 2013-10-11 | Disposition: A | Discharge status: Home / Self Care | Payer: BC Managed Care – PPO | Source: Ambulatory Visit | Attending: Family Medicine | Admitting: Family Medicine

## 2013-10-11 ENCOUNTER — Other Ambulatory Visit: Payer: Self-pay | Admitting: Family Medicine

## 2013-10-11 DIAGNOSIS — M25571 Pain in right ankle and joints of right foot: Secondary | ICD-10-CM

## 2013-10-24 IMAGING — CT CT CHEST W/O CM
2 of 6 series · 8 of 36 positions shown, 10 images · non-contrast
Comparison: Chest radiograph 06/24/2011.

CLINICAL DATA: Productive cough, wheezing and shortness of breath.
Abnormal interstitial lung markings.

CT CHEST WITHOUT CONTRAST
TECHNIQUE: Multidetector CT imaging of the chest was performed
following the standard protocol without IV contrast.

[Series 8: hires retro entire lungs · axial · 0.80mm/px · z∈[-283,-83]mm · 5 of 32 slices shown, 7 images]
[im 6/32  mediastinal]
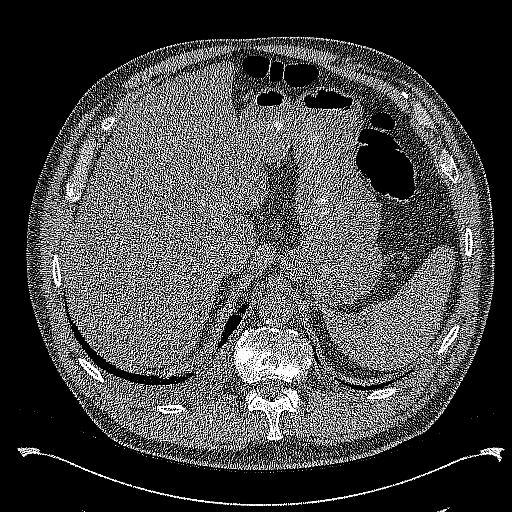
[im 6/32  lung]
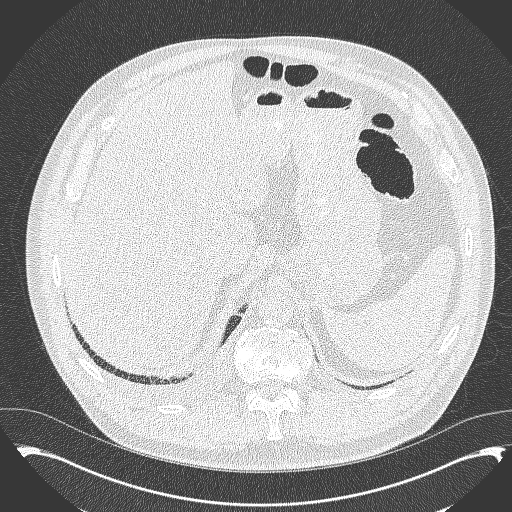
[im 11/32  lung]
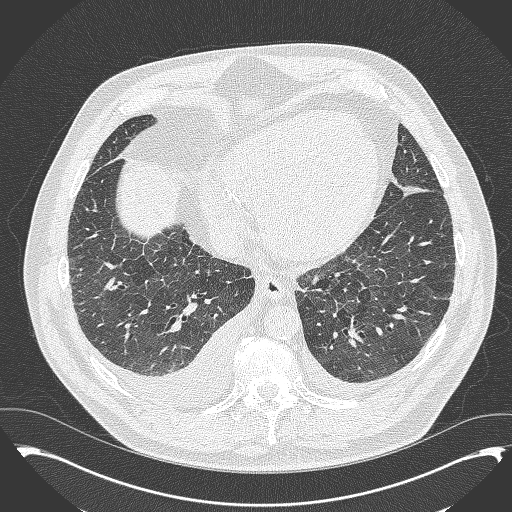
[im 16/32  lung]
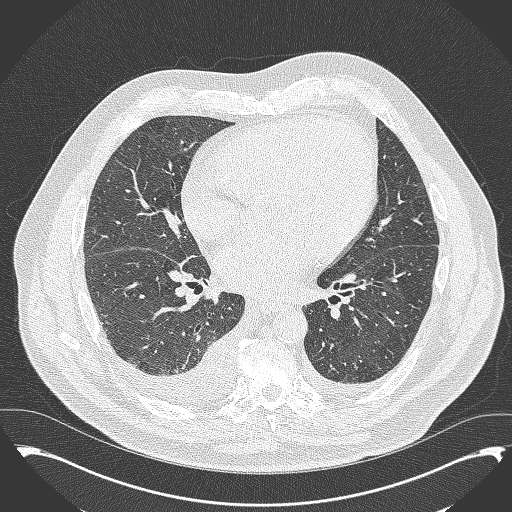
[im 21/32  lung]
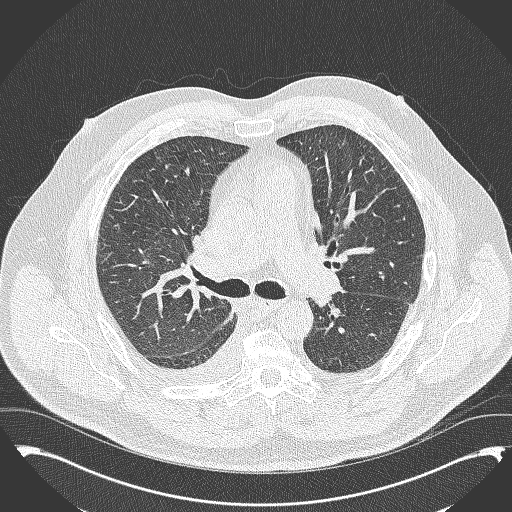
[im 26/32  mediastinal]
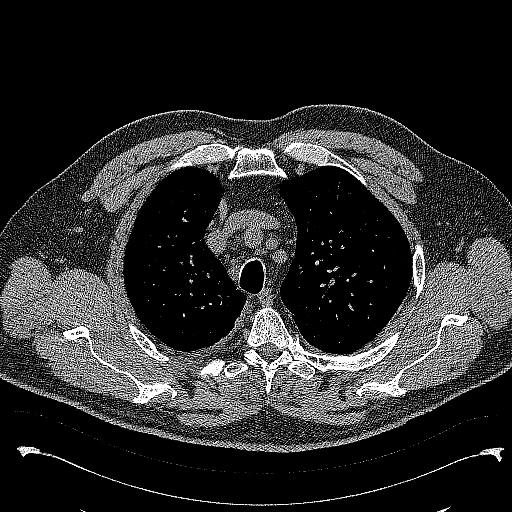
[im 26/32  lung]
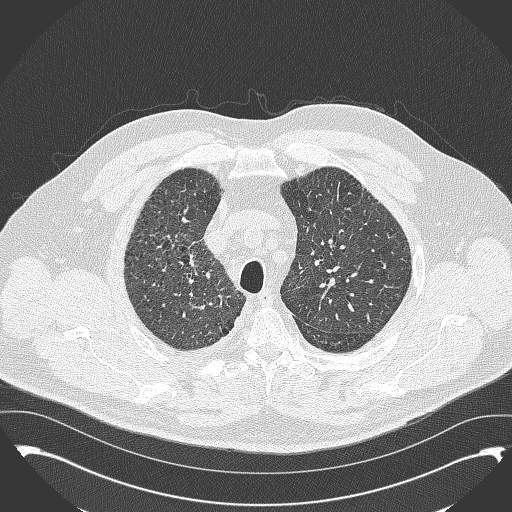

[Series 602: cor · coronal · 0.80mm/px · 3 of 133 slices shown]
[im 27/133  lung]
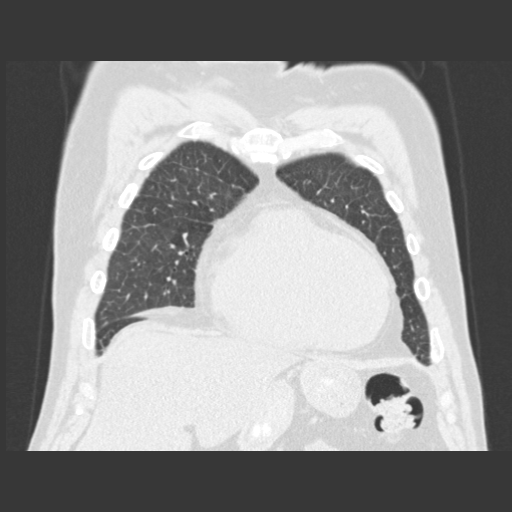
[im 53/133  lung]
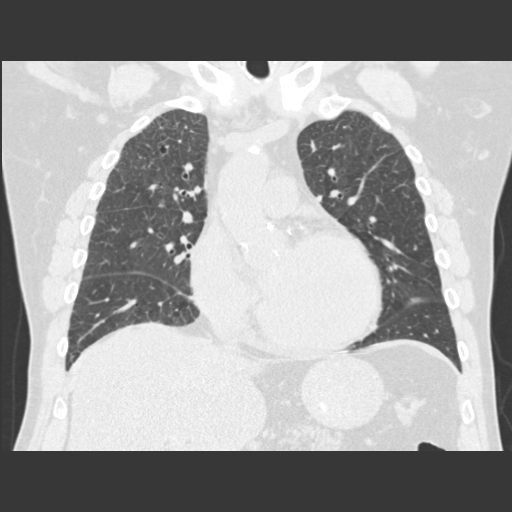
[im 80/133  lung]
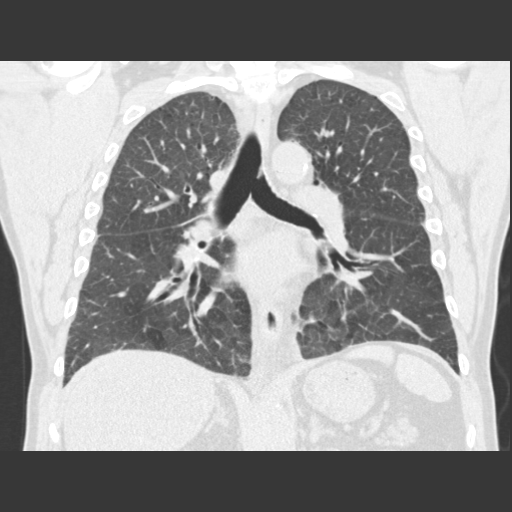

[8 of 36 positions shown; findings below may reference images not displayed]

FINDINGS: Mediastinal lymph nodes measure up to 11 mm in the low
right paratracheal station. Subcarinal lymph node measures 1.4 cm.
Hilar regions are difficult to definitively evaluate without IV
contrast.  No axillary adenopathy.  Heart is mildly enlarged.  No
pericardial effusion. Coronary artery calcification.

Small bilateral pleural effusions, right greater than left.
Centrilobular emphysema.  There is septal thickening at the lung
bases. Difficult to exclude mild subpleural reticulation on high
resolution imaging.  No air trapping on inspiratory and expiratory
images.

Incidental imaging of the upper abdomen shows scattered
calcifications in the pancreas.  Degenerative changes are seen in
the spine. Mild scattered anterior wedging of thoracolumbar
vertebral bodies.  No worrisome lytic or sclerotic lesions.
IMPRESSION: 1.  Congestive heart failure.
2.  Question mild subpleural reticulation, indicative of fibrosis.
3.  Coronary artery calcification.
4.  Chronic calcific pancreatitis.

## 2013-10-26 ENCOUNTER — Other Ambulatory Visit (HOSPITAL_COMMUNITY): Payer: Self-pay | Admitting: Cardiology

## 2013-10-26 DIAGNOSIS — I509 Heart failure, unspecified: Secondary | ICD-10-CM

## 2013-10-26 MED ORDER — SPIRONOLACTONE 25 MG PO TABS: 25.0000 mg | ORAL_TABLET | Freq: Every day | ORAL | Status: DC

## 2013-11-03 ENCOUNTER — Ambulatory Visit (INDEPENDENT_AMBULATORY_CARE_PROVIDER_SITE_OTHER): Payer: BC Managed Care – PPO | Admitting: *Deleted

## 2013-11-03 DIAGNOSIS — I428 Other cardiomyopathies: Secondary | ICD-10-CM

## 2013-11-03 DIAGNOSIS — I5022 Chronic systolic (congestive) heart failure: Secondary | ICD-10-CM

## 2013-11-03 DIAGNOSIS — I447 Left bundle-branch block, unspecified: Secondary | ICD-10-CM

## 2013-11-07 LAB — MDC_IDC_ENUM_SESS_TYPE_REMOTE
Battery Remaining Longevity: 64 mo
Battery Remaining Percentage: 74 %
Brady Statistic AP VP Percent: 1.7 %
Brady Statistic AS VP Percent: 97 %
Brady Statistic AS VS Percent: 1 %
HIGH POWER IMPEDANCE MEASURED VALUE: 79 Ohm
HighPow Impedance: 79 Ohm
Implantable Pulse Generator Model: 3265
Implantable Pulse Generator Serial Number: 7021876
Lead Channel Impedance Value: 1075 Ohm
Lead Channel Impedance Value: 510 Ohm
Lead Channel Pacing Threshold Amplitude: 0.5 V
Lead Channel Pacing Threshold Amplitude: 1.5 V
Lead Channel Pacing Threshold Pulse Width: 0.5 ms
Lead Channel Pacing Threshold Pulse Width: 0.5 ms
Lead Channel Setting Pacing Amplitude: 1.5 V
Lead Channel Setting Pacing Amplitude: 2.25 V
Lead Channel Setting Pacing Amplitude: 2.5 V
Lead Channel Setting Pacing Pulse Width: 0.5 ms
Lead Channel Setting Sensing Sensitivity: 0.5 mV
MDC IDC MSMT BATTERY VOLTAGE: 2.95 V
MDC IDC MSMT LEADCHNL LV PACING THRESHOLD AMPLITUDE: 1.25 V
MDC IDC MSMT LEADCHNL LV PACING THRESHOLD PULSEWIDTH: 0.5 ms
MDC IDC MSMT LEADCHNL RA SENSING INTR AMPL: 2 mV
MDC IDC MSMT LEADCHNL RV IMPEDANCE VALUE: 400 Ohm
MDC IDC MSMT LEADCHNL RV SENSING INTR AMPL: 12 mV
MDC IDC SESS DTM: 20150416060033
MDC IDC SET LEADCHNL RV PACING PULSEWIDTH: 0.5 ms
MDC IDC STAT BRADY AP VS PERCENT: 1 %
MDC IDC STAT BRADY RA PERCENT PACED: 1.6 %
Zone Setting Detection Interval: 280 ms
Zone Setting Detection Interval: 350 ms

## 2013-11-15 ENCOUNTER — Encounter: Payer: Self-pay | Admitting: *Deleted

## 2013-11-17 ENCOUNTER — Encounter: Payer: Self-pay | Admitting: Internal Medicine

## 2013-12-09 ENCOUNTER — Other Ambulatory Visit (HOSPITAL_COMMUNITY): Payer: Self-pay | Admitting: Internal Medicine

## 2013-12-16 ENCOUNTER — Other Ambulatory Visit (HOSPITAL_COMMUNITY): Payer: Self-pay | Admitting: *Deleted

## 2013-12-16 MED ORDER — DIGOXIN 125 MCG PO TABS: 0.1250 mg | ORAL_TABLET | Freq: Every day | ORAL | Status: DC

## 2014-01-11 ENCOUNTER — Encounter: Payer: Self-pay | Admitting: Internal Medicine

## 2014-02-01 ENCOUNTER — Encounter: Payer: BC Managed Care – PPO | Admitting: Internal Medicine

## 2014-02-08 ENCOUNTER — Encounter: Payer: Self-pay | Admitting: Internal Medicine

## 2014-02-08 ENCOUNTER — Ambulatory Visit (INDEPENDENT_AMBULATORY_CARE_PROVIDER_SITE_OTHER): Payer: BC Managed Care – PPO | Admitting: Internal Medicine

## 2014-02-08 VITALS — BP 124/65 | HR 73 | Ht 71.0 in | Wt 213.8 lb

## 2014-02-08 DIAGNOSIS — I447 Left bundle-branch block, unspecified: Secondary | ICD-10-CM

## 2014-02-08 DIAGNOSIS — I5022 Chronic systolic (congestive) heart failure: Secondary | ICD-10-CM

## 2014-02-08 DIAGNOSIS — Z95810 Presence of automatic (implantable) cardiac defibrillator: Secondary | ICD-10-CM

## 2014-02-08 DIAGNOSIS — I428 Other cardiomyopathies: Secondary | ICD-10-CM

## 2014-02-08 LAB — MDC_IDC_ENUM_SESS_TYPE_INCLINIC
Brady Statistic RV Percent Paced: 98 %
Date Time Interrogation Session: 20150722133354
HighPow Impedance: 80 Ohm
Implantable Pulse Generator Model: 3265
Implantable Pulse Generator Serial Number: 7021876
Lead Channel Impedance Value: 412.5 Ohm
Lead Channel Pacing Threshold Amplitude: 1.25 V
Lead Channel Pacing Threshold Pulse Width: 0.5 ms
Lead Channel Pacing Threshold Pulse Width: 0.5 ms
Lead Channel Sensing Intrinsic Amplitude: 1.7 mV
Lead Channel Sensing Intrinsic Amplitude: 12 mV
Lead Channel Setting Pacing Amplitude: 1.5 V
Lead Channel Setting Pacing Amplitude: 2.25 V
Lead Channel Setting Pacing Pulse Width: 0.5 ms
Lead Channel Setting Sensing Sensitivity: 0.5 mV
MDC IDC MSMT BATTERY REMAINING LONGEVITY: 61.2 mo
MDC IDC MSMT LEADCHNL LV IMPEDANCE VALUE: 1100 Ohm
MDC IDC MSMT LEADCHNL LV PACING THRESHOLD AMPLITUDE: 1.125 V
MDC IDC MSMT LEADCHNL LV PACING THRESHOLD PULSEWIDTH: 0.5 ms
MDC IDC MSMT LEADCHNL RA IMPEDANCE VALUE: 512.5 Ohm
MDC IDC MSMT LEADCHNL RA PACING THRESHOLD AMPLITUDE: 0.5 V
MDC IDC SET LEADCHNL LV PACING AMPLITUDE: 2.125
MDC IDC SET LEADCHNL RV PACING PULSEWIDTH: 0.5 ms
MDC IDC SET ZONE DETECTION INTERVAL: 350 ms
MDC IDC STAT BRADY RA PERCENT PACED: 1.8 %
Zone Setting Detection Interval: 280 ms

## 2014-02-08 NOTE — Patient Instructions (Signed)
Your physician wants you to follow-up in: 12 months with Dr Vallery Ridge will receive a reminder letter in the mail two months in advance. If you don't receive a letter, please call our office to schedule the follow-up appointment.    Remote monitoring is used to monitor your Pacemaker or ICD from home. This monitoring reduces the number of office visits required to check your device to one time per year. It allows [place] to keep an eye on the functioning of your device to ensure it is working properly. You are scheduled for a device check from home on 05/10/14. You may send your transmission at any time that day. If you have a wireless device, the transmission will be sent automatically. After your physician reviews your transmission, you will receive a postcard with your next transmission date.

## 2014-02-08 NOTE — Progress Notes (Signed)
Primary Cardiologist: Pierre Bali, MD  [name] is a 73 y.o. male who presents today for routine electrophysiology followup.  Since last being seen in our clinic, the patient reports doing very well.  He is very active walking 2 miles a day on the treadmill and gardening.   Today, he denies symptoms of palpitations, chest pain, shortness of breath,  lower extremity edema, dizziness, presyncope, syncope, or ICD shocks.  The patient is otherwise without complaint today.   Past Medical History  Diagnosis Date  . Chronic systolic heart failure   . HTN (hypertension)   . Hyperlipidemia   . Erectile dysfunction   . Insomnia   . DCM (dilated cardiomyopathy)   . CHF (congestive heart failure)   . 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 Nasal sinus surgery  93, 97, 2010, 2006  . Cardiac defibrillator placement  02/13/2012    SJM Quadra Assura BIV ICD implanted by Dr Rayann Heman    Current Outpatient Prescriptions  Medication Sig Dispense Refill  . amitriptyline (ELAVIL) 100 MG tablet Take 100 mg by mouth at bedtime.        Marland Kitchen aspirin 81 MG tablet Take 81 mg by mouth daily.        . carvedilol (COREG) 25 MG tablet TAKE 1 TABLET TWICE A DAY  180 tablet  3  . digoxin (LANOXIN) 0.125 MG tablet Take 1 tablet (0.125 mg total) by mouth daily.  90 tablet  3  . furosemide (LASIX) 20 MG tablet Take 1 tablet (20 mg total) by mouth 2 (two) times daily.  180 tablet  3  . LORazepam (ATIVAN) 0.5 MG tablet Take 1 mg by mouth 2 (two) times daily.       Marland Kitchen losartan (COZAAR) 100 MG tablet Take 1 tablet in the AM and 1/2 tablet in the PM      . Multiple Vitamins-Minerals (MULTIVITAMIN WITH MINERALS) tablet Take 1 tablet by mouth daily.        . rosuvastatin (CRESTOR) 40 MG tablet Take 20 mg by mouth daily.       Marland Kitchen spironolactone (ALDACTONE) 25 MG tablet Take 1 tablet (25 mg total) by mouth daily.   90 tablet  3  . WELCHOL 3.75 G PACK Take 3.75 g by mouth daily.       Marland Kitchen zolpidem (AMBIEN) 10 MG tablet Take 1 tablet by mouth At bedtime.      . [DISCONTINUED] albuterol (PROAIR HFA) 108 (90 BASE) MCG/ACT inhaler Inhale 2 puffs into the lungs as needed.  1 Inhaler  3   No current facility-administered medications for this visit.    Physical Exam: Filed Vitals:   02/08/14 1202  BP: 124/65  Pulse: 73  Height: 5\' 11"  (1.803 m)  Weight: 213 lb 12.8 oz (96.979 kg)    GEN- The patient is well appearing, alert and oriented x 3 today.   Head- normocephalic, atraumatic Eyes-  Sclera clear, conjunctiva pink Ears- hearing intact Oropharynx- clear Lungs- Clear to ausculation bilaterally, normal work of breathing Chest- ICD pocket is well healed Heart- Regular rate and rhythm, no murmurs, rubs or gallops, PMI not laterally displaced GI- soft, NT, ND, + BS Extremities- no clubbing, cyanosis, or edema  ICD interrogation- reviewed in detail today,  See PACEART report ekg today reveals sinus   Assessment and Plan:  1. Chronic systolic dysfunction Much improved  with CRT Normal BiV ICD function See Pace Art report No changes today  2. HTN Stable No change required today  Merlin checks Return in 1 year

## 2014-02-17 ENCOUNTER — Encounter (HOSPITAL_COMMUNITY): Payer: Self-pay | Admitting: Vascular Surgery

## 2014-04-06 ENCOUNTER — Ambulatory Visit (HOSPITAL_COMMUNITY)
Admission: RE | Admit: 2014-04-06 | Discharge: 2014-04-06 | Disposition: A | Discharge status: Home / Self Care | Payer: BC Managed Care – PPO | Source: Ambulatory Visit | Attending: Internal Medicine | Admitting: Internal Medicine

## 2014-04-06 ENCOUNTER — Encounter (HOSPITAL_COMMUNITY): Payer: Self-pay

## 2014-04-06 VITALS — BP 144/54 | HR 72 | Wt 213.2 lb

## 2014-04-06 DIAGNOSIS — E785 Hyperlipidemia, unspecified: Secondary | ICD-10-CM | POA: Insufficient documentation

## 2014-04-06 DIAGNOSIS — I4949 Other premature depolarization: Secondary | ICD-10-CM | POA: Diagnosis not present

## 2014-04-06 DIAGNOSIS — G4700 Insomnia, unspecified: Secondary | ICD-10-CM | POA: Diagnosis not present

## 2014-04-06 DIAGNOSIS — Z7982 Long term (current) use of aspirin: Secondary | ICD-10-CM | POA: Insufficient documentation

## 2014-04-06 DIAGNOSIS — N529 Male erectile dysfunction, unspecified: Secondary | ICD-10-CM | POA: Diagnosis not present

## 2014-04-06 DIAGNOSIS — Z9889 Other specified postprocedural states: Secondary | ICD-10-CM | POA: Diagnosis not present

## 2014-04-06 DIAGNOSIS — Z95810 Presence of automatic (implantable) cardiac defibrillator: Secondary | ICD-10-CM | POA: Insufficient documentation

## 2014-04-06 DIAGNOSIS — Z79899 Other long term (current) drug therapy: Secondary | ICD-10-CM | POA: Insufficient documentation

## 2014-04-06 DIAGNOSIS — I428 Other cardiomyopathies: Secondary | ICD-10-CM | POA: Insufficient documentation

## 2014-04-06 DIAGNOSIS — I10 Essential (primary) hypertension: Secondary | ICD-10-CM | POA: Diagnosis not present

## 2014-04-06 DIAGNOSIS — I2510 Atherosclerotic heart disease of native coronary artery without angina pectoris: Secondary | ICD-10-CM | POA: Diagnosis not present

## 2014-04-06 DIAGNOSIS — I509 Heart failure, unspecified: Secondary | ICD-10-CM | POA: Diagnosis not present

## 2014-04-06 DIAGNOSIS — C44611 Basal cell carcinoma of skin of unspecified upper limb, including shoulder: Secondary | ICD-10-CM | POA: Insufficient documentation

## 2014-04-06 DIAGNOSIS — I447 Left bundle-branch block, unspecified: Secondary | ICD-10-CM | POA: Diagnosis not present

## 2014-04-06 DIAGNOSIS — I493 Ventricular premature depolarization: Secondary | ICD-10-CM

## 2014-04-06 DIAGNOSIS — E669 Obesity, unspecified: Secondary | ICD-10-CM | POA: Diagnosis not present

## 2014-04-06 DIAGNOSIS — I5022 Chronic systolic (congestive) heart failure: Secondary | ICD-10-CM | POA: Diagnosis not present

## 2014-04-06 LAB — BASIC METABOLIC PANEL
Anion gap: 13 (ref 5–15)
BUN: 16 mg/dL (ref 6–23)
CALCIUM: 9 mg/dL (ref 8.4–10.5)
CO2: 24 meq/L (ref 19–32)
Chloride: 101 mEq/L (ref 96–112)
Creatinine, Ser: 0.86 mg/dL (ref 0.50–1.35)
GFR calc Af Amer: >90 mL/min (ref >90)
GFR calc non Af Amer: 84 mL/min — ABNORMAL LOW (ref >90)
GLUCOSE: 114 mg/dL — AB (ref 70–99)
Potassium: 4.5 mEq/L (ref 3.7–5.3)
Sodium: 138 mEq/L (ref 137–147)

## 2014-04-06 NOTE — Progress Notes (Signed)
Patient ID: Louis Juarez., male   DOB: Oct 16, 1940, 73 y.o.   MRN: [id number]  PCP:  Dr. Serita Grammes Primary Cardiologist:  Dr. Jenkins Rouge (will not go back and see) Primary Electrophysiologist:  Dr. Rayann Heman    History of Present Illness: Daily Crate. is a 73 y.o. male with h/o HTN, obesity, previous tobacco use (mild restriction on PFTs, no obstruction) and LBBB.  S/p St Jude CRT-D (7/13 followed by Dr. Rayann Heman)   He developed dyspnea/wheezing in summer of 2012. Referred to Dr. Gwenette Greet. Mild airway disease. Echocardiogram 06/30/11: EF 10-15%, inferior, septal, apical akinesis, mild LVH, severe LAE, mild RAE, moderately reduced RVSF, PASP 52.  Cath by Dr. Burt Knack. Mild non-obs CAD. LM 20-30%, LAD ok, LCx 30-40% RCA 30-40% prox 50% distal. LVEF 10-15%.  CPX test:  4/13: Spirometry: normal Peak VO2: 17.7 ml/kg/min predicted peak VO2: 86.5% (corrected to ideal weight VO2 21ml/kg/min) VE/VCO2 slope: 31.1 OUES: 2.11 Peak RER: 1.20 Ventilatory Threshold: 12.0 % predicted peak VO2: 58.7% VE/MVV: 64.9% PETCO2 at peak: 35 O2pulse: 15 % predicted O2pulse: 100%  Echo 7/13: EF 15%  Echo 12/13: 25-30% Echo 03/2013: EF 40-45%  Follow up for Heart Failure: Last visit increase losartan to 150 mg daily which he tolerated.  Doing very well. Denies SOB, orthopnea, CP or edema. Walking 1 1/2-2 miles a day on the treadmill. Weight at home 204-208 lbs. Going sailing again to [place].   Labs (10/14): K 4.7, creatinine 0.96  Past Medical History  Diagnosis Date  . Chronic systolic heart failure   . HTN (hypertension)   . Hyperlipidemia   . Erectile dysfunction   . Insomnia   . DCM (dilated cardiomyopathy)   . CHF (congestive heart failure)   . 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 amitriptyline (ELAVIL) 100 MG tablet Take 100 mg by mouth at bedtime.        Marland Kitchen  aspirin 81 MG tablet Take 81 mg by mouth daily.        . carvedilol (COREG) 25 MG tablet TAKE 1 TABLET TWICE A DAY  180 tablet  3  . digoxin (LANOXIN) 0.125 MG tablet Take 1 tablet (0.125 mg total) by mouth daily.  90 tablet  3  . furosemide (LASIX) 20 MG tablet Take 1 tablet (20 mg total) by mouth 2 (two) times daily.  180 tablet  3  . LORazepam (ATIVAN) 0.5 MG tablet Take 1 mg by mouth 2 (two) times daily.       Marland Kitchen losartan (COZAAR) 100 MG tablet Take 1 tablet in the AM and 1/2 tablet in the PM      . Multiple Vitamins-Minerals (MULTIVITAMIN WITH MINERALS) tablet Take 1 tablet by mouth daily.        . rosuvastatin (CRESTOR) 40 MG tablet Take 20 mg by mouth daily.       Marland Kitchen spironolactone (ALDACTONE) 25 MG tablet Take 1 tablet (25 mg total) by mouth daily.  90 tablet  3  . WELCHOL 3.75 G PACK Take 3.75 g by mouth daily.       Marland Kitchen zolpidem (AMBIEN) 10 MG tablet Take 1 tablet by mouth At bedtime.      . [DISCONTINUED] albuterol (PROAIR HFA) 108 (90 BASE) MCG/ACT inhaler Inhale 2 puffs into the lungs as needed.  1 Inhaler  3   No current facility-administered medications  for this encounter.    Allergies: No Known Allergies  ROS:  Please see the history of present illness.   All other systems reviewed and negative.   Filed Vitals:   04/06/14 1016  BP: 144/54  Pulse: 72  Weight: 213 lb 4 oz (96.73 kg)  SpO2: 96%   PHYSICAL EXAM: General: Well nourished, well developed, in no acute distress HEENT: normal Neck: no JVD Cardiac:  PMI laterally displaced. RRR; no murmur. No s3 Lungs:  clear to auscultation bilaterally, no wheezing, rhonchi or rales Abd: soft, obese. nontender, no hepatomegaly Ext: no edema Skin: warm and dry Neuro:  CNs 2-12 intact, no focal abnormalities noted  ASSESSMENT AND PLAN:   1. Chronic systolic HF: NICM, EF 83-41% (03/2013) s/p St Jude Biv ICD.   - NYHA I symptoms and volume status stable. Will continue lasix 20 mg BID. - ICD interrogated and thoracic impedence  up and PVC burden 1.6%. - Continue coreg 25 mg BID, losartan 150 mg daily and spironolactone 25 mg daily. - Stop digoxin.  - Check BMET - Repeat ECHO next visit.  - Reinforced the need and importance of daily weights, a low sodium diet, and fluid restriction (less than 2 L a day). Instructed to call the HF clinic if weight increases more than 3 lbs overnight or 5 lbs in a week.  2. Nonobstructive CAD: no s/s of ischemia. Continue ASA, statin and BB.  3. HTN: Controlled on current regimen.    3 months repeat BMET and follow up in 6 months with ECHO  Junie Bame B NP-C 10:56 AM  Patient seen and examined with Junie Bame, NP. We discussed all aspects of the encounter. I agree with the assessment and plan as stated above. Doing great. NYHA I. Volume status looks good. ICD interrogated.  PVCs only 1.6%. Repeat echo at next visit.   Benay Spice 3:48 PM

## 2014-04-06 NOTE — Patient Instructions (Signed)
Doing great, keep up the good work!!!  Have fun in the [place].  Stop digoxin  Need to get blood work in 3 months  Follow up in 6 months with ECHO  Do the following things EVERYDAY: 1) Weigh yourself in the morning before breakfast. Write it down and keep it in a log. 2) Take your medicines as prescribed 3) Eat low salt foods-Limit salt (sodium) to 2000 mg per day.  4) Stay as active as you can everyday 5) Limit all fluids for the day to less than 2 liters 6)

## 2014-04-11 ENCOUNTER — Encounter: Payer: Self-pay | Admitting: Internal Medicine

## 2014-04-17 ENCOUNTER — Ambulatory Visit (INDEPENDENT_AMBULATORY_CARE_PROVIDER_SITE_OTHER): Payer: BC Managed Care – PPO | Admitting: *Deleted

## 2014-04-17 DIAGNOSIS — I428 Other cardiomyopathies: Secondary | ICD-10-CM

## 2014-04-17 DIAGNOSIS — I5022 Chronic systolic (congestive) heart failure: Secondary | ICD-10-CM

## 2014-04-17 LAB — MDC_IDC_ENUM_SESS_TYPE_REMOTE
Brady Statistic RA Percent Paced: 2.5 %
Brady Statistic RV Percent Paced: 96 %
HighPow Impedance: 83 Ohm
Implantable Pulse Generator Model: 3265
Lead Channel Impedance Value: 1125 Ohm
Lead Channel Pacing Threshold Amplitude: 0.5 V
Lead Channel Pacing Threshold Amplitude: 1.5 V
Lead Channel Pacing Threshold Pulse Width: 0.5 ms
Lead Channel Sensing Intrinsic Amplitude: 12 mV
Lead Channel Setting Pacing Amplitude: 2.125
Lead Channel Setting Pacing Amplitude: 2.5 V
Lead Channel Setting Pacing Pulse Width: 0.5 ms
MDC IDC MSMT LEADCHNL LV PACING THRESHOLD AMPLITUDE: 1.125 V
MDC IDC MSMT LEADCHNL LV PACING THRESHOLD PULSEWIDTH: 0.5 ms
MDC IDC MSMT LEADCHNL RA IMPEDANCE VALUE: 490 Ohm
MDC IDC MSMT LEADCHNL RA SENSING INTR AMPL: 2.4 mV
MDC IDC MSMT LEADCHNL RV IMPEDANCE VALUE: 410 Ohm
MDC IDC MSMT LEADCHNL RV PACING THRESHOLD PULSEWIDTH: 0.5 ms
MDC IDC PG SERIAL: 7021876
MDC IDC SET LEADCHNL LV PACING PULSEWIDTH: 0.5 ms
MDC IDC SET LEADCHNL RA PACING AMPLITUDE: 1.5 V
MDC IDC SET LEADCHNL RV SENSING SENSITIVITY: 0.5 mV
MDC IDC SET ZONE DETECTION INTERVAL: 350 ms
Zone Setting Detection Interval: 280 ms

## 2014-04-17 NOTE — Progress Notes (Signed)
Remote ICD transmission.   

## 2014-04-24 ENCOUNTER — Other Ambulatory Visit (HOSPITAL_COMMUNITY): Payer: Self-pay | Admitting: Cardiology

## 2014-04-24 DIAGNOSIS — I5022 Chronic systolic (congestive) heart failure: Secondary | ICD-10-CM

## 2014-04-24 MED ORDER — FUROSEMIDE 20 MG PO TABS: 20.0000 mg | ORAL_TABLET | Freq: Two times a day (BID) | ORAL | Status: DC

## 2014-04-26 ENCOUNTER — Encounter: Payer: Self-pay | Admitting: Cardiology

## 2014-04-27 ENCOUNTER — Encounter: Payer: Self-pay | Admitting: Internal Medicine

## 2014-06-21 ENCOUNTER — Other Ambulatory Visit (HOSPITAL_COMMUNITY): Payer: Self-pay | Admitting: Internal Medicine

## 2014-06-22 MED ORDER — LOSARTAN POTASSIUM 100 MG PO TABS: ORAL_TABLET | ORAL | Status: DC

## 2014-06-29 ENCOUNTER — Encounter (HOSPITAL_COMMUNITY): Payer: Self-pay | Admitting: Internal Medicine

## 2014-07-19 ENCOUNTER — Encounter: Payer: Self-pay | Admitting: Internal Medicine

## 2014-07-19 ENCOUNTER — Ambulatory Visit (INDEPENDENT_AMBULATORY_CARE_PROVIDER_SITE_OTHER): Payer: BC Managed Care – PPO | Admitting: *Deleted

## 2014-07-19 ENCOUNTER — Telehealth (HOSPITAL_COMMUNITY): Payer: Self-pay | Admitting: Vascular Surgery

## 2014-07-19 DIAGNOSIS — I5022 Chronic systolic (congestive) heart failure: Secondary | ICD-10-CM

## 2014-07-19 DIAGNOSIS — I428 Other cardiomyopathies: Secondary | ICD-10-CM

## 2014-07-19 DIAGNOSIS — I429 Cardiomyopathy, unspecified: Secondary | ICD-10-CM

## 2014-07-19 LAB — MDC_IDC_ENUM_SESS_TYPE_REMOTE
Battery Voltage: 2.95 V
Brady Statistic AP VP Percent: 3.9 %
Brady Statistic AP VS Percent: 1 %
Brady Statistic AS VP Percent: 91 %
Brady Statistic RA Percent Paced: 1.8 %
HIGH POWER IMPEDANCE MEASURED VALUE: 78 Ohm
HighPow Impedance: 78 Ohm
Implantable Pulse Generator Model: 3265
Implantable Pulse Generator Serial Number: 7021876
Lead Channel Impedance Value: 410 Ohm
Lead Channel Impedance Value: 460 Ohm
Lead Channel Pacing Threshold Amplitude: 1.5 V
Lead Channel Pacing Threshold Pulse Width: 0.5 ms
Lead Channel Pacing Threshold Pulse Width: 0.5 ms
Lead Channel Sensing Intrinsic Amplitude: 12 mV
Lead Channel Setting Pacing Amplitude: 1.5 V
Lead Channel Setting Pacing Amplitude: 2.25 V
Lead Channel Setting Pacing Amplitude: 2.5 V
Lead Channel Setting Pacing Pulse Width: 0.5 ms
Lead Channel Setting Pacing Pulse Width: 0.5 ms
Lead Channel Setting Sensing Sensitivity: 0.5 mV
MDC IDC MSMT BATTERY REMAINING LONGEVITY: 56 mo
MDC IDC MSMT BATTERY REMAINING PERCENTAGE: 65 %
MDC IDC MSMT LEADCHNL LV IMPEDANCE VALUE: 1100 Ohm
MDC IDC MSMT LEADCHNL LV PACING THRESHOLD AMPLITUDE: 1.25 V
MDC IDC MSMT LEADCHNL RA PACING THRESHOLD AMPLITUDE: 0.5 V
MDC IDC MSMT LEADCHNL RA PACING THRESHOLD PULSEWIDTH: 0.5 ms
MDC IDC MSMT LEADCHNL RA SENSING INTR AMPL: 1.4 mV
MDC IDC SESS DTM: 20151230070019
MDC IDC SET ZONE DETECTION INTERVAL: 350 ms
MDC IDC STAT BRADY AS VS PERCENT: 3.7 %
Zone Setting Detection Interval: 280 ms

## 2014-07-19 MED ORDER — CARVEDILOL 25 MG PO TABS
25.0000 mg | ORAL_TABLET | Freq: Two times a day (BID) | ORAL | Status: DC
Start: 2014-07-19 — End: 2014-07-20

## 2014-07-19 NOTE — Progress Notes (Signed)
Remote ICD transmission.   

## 2014-07-19 NOTE — Telephone Encounter (Signed)
Refill Carvedilol  

## 2014-07-20 ENCOUNTER — Telehealth (HOSPITAL_COMMUNITY): Payer: Self-pay | Admitting: Vascular Surgery

## 2014-07-20 ENCOUNTER — Other Ambulatory Visit: Payer: Self-pay | Admitting: Internal Medicine

## 2014-07-20 DIAGNOSIS — I5022 Chronic systolic (congestive) heart failure: Secondary | ICD-10-CM

## 2014-07-20 MED ORDER — CARVEDILOL 25 MG PO TABS: 25.0000 mg | ORAL_TABLET | Freq: Two times a day (BID) | ORAL | Status: DC

## 2014-07-20 MED ORDER — FUROSEMIDE 20 MG PO TABS: 20.0000 mg | ORAL_TABLET | Freq: Two times a day (BID) | ORAL | Status: DC

## 2014-07-20 NOTE — Addendum Note (Signed)
Addended by: JEFFRIES, Sharlot Gowda on: 07/20/2014 02:45 PM   Modules accepted: Orders

## 2014-07-24 ENCOUNTER — Other Ambulatory Visit (HOSPITAL_COMMUNITY): Payer: Self-pay | Admitting: *Deleted

## 2014-07-24 DIAGNOSIS — I5022 Chronic systolic (congestive) heart failure: Secondary | ICD-10-CM

## 2014-07-24 MED ORDER — CARVEDILOL 25 MG PO TABS: 25.0000 mg | ORAL_TABLET | Freq: Two times a day (BID) | ORAL | Status: DC

## 2014-07-24 NOTE — Telephone Encounter (Signed)
Open in error

## 2014-07-26 ENCOUNTER — Other Ambulatory Visit (HOSPITAL_COMMUNITY): Payer: Self-pay | Admitting: *Deleted

## 2014-07-26 DIAGNOSIS — I5022 Chronic systolic (congestive) heart failure: Secondary | ICD-10-CM

## 2014-07-26 MED ORDER — FUROSEMIDE 20 MG PO TABS: 20.0000 mg | ORAL_TABLET | Freq: Two times a day (BID) | ORAL | Status: DC

## 2014-07-28 ENCOUNTER — Encounter: Payer: Self-pay | Admitting: *Deleted

## 2014-08-21 ENCOUNTER — Other Ambulatory Visit (HOSPITAL_COMMUNITY): Payer: Self-pay

## 2014-08-21 DIAGNOSIS — I5022 Chronic systolic (congestive) heart failure: Secondary | ICD-10-CM

## 2014-08-21 MED ORDER — FUROSEMIDE 20 MG PO TABS: 20.0000 mg | ORAL_TABLET | Freq: Two times a day (BID) | ORAL | Status: DC

## 2014-10-16 ENCOUNTER — Telehealth (HOSPITAL_COMMUNITY): Payer: Self-pay | Admitting: Vascular Surgery

## 2014-10-16 DIAGNOSIS — I5022 Chronic systolic (congestive) heart failure: Secondary | ICD-10-CM

## 2014-10-16 MED ORDER — SPIRONOLACTONE 25 MG PO TABS: 25.0000 mg | ORAL_TABLET | Freq: Every day | ORAL | Status: DC

## 2014-10-16 NOTE — Telephone Encounter (Signed)
Pt needs new prescription Sp[facility]one [facility].. PLEASE ADVISE

## 2014-10-18 ENCOUNTER — Ambulatory Visit (INDEPENDENT_AMBULATORY_CARE_PROVIDER_SITE_OTHER): Payer: BLUE CROSS/BLUE SHIELD | Admitting: *Deleted

## 2014-10-18 ENCOUNTER — Encounter: Payer: Self-pay | Admitting: Internal Medicine

## 2014-10-18 ENCOUNTER — Telehealth: Payer: Self-pay | Admitting: Cardiology

## 2014-10-18 DIAGNOSIS — I5022 Chronic systolic (congestive) heart failure: Secondary | ICD-10-CM

## 2014-10-18 DIAGNOSIS — I429 Cardiomyopathy, unspecified: Secondary | ICD-10-CM | POA: Diagnosis not present

## 2014-10-18 DIAGNOSIS — I428 Other cardiomyopathies: Secondary | ICD-10-CM

## 2014-10-18 NOTE — Telephone Encounter (Signed)
LMOVM reminding pt to send remote transmission.   

## 2014-10-19 ENCOUNTER — Telehealth: Payer: Self-pay | Admitting: *Deleted

## 2014-10-19 LAB — MDC_IDC_ENUM_SESS_TYPE_REMOTE
Brady Statistic RA Percent Paced: 1.8 %
Brady Statistic RV Percent Paced: 94 %
HighPow Impedance: 78 Ohm
Implantable Pulse Generator Serial Number: 7021876
Lead Channel Impedance Value: 400 Ohm
Lead Channel Pacing Threshold Amplitude: 1.125 V
Lead Channel Pacing Threshold Pulse Width: 0.5 ms
Lead Channel Pacing Threshold Pulse Width: 0.5 ms
Lead Channel Sensing Intrinsic Amplitude: 12 mV
Lead Channel Sensing Intrinsic Amplitude: 2.7 mV
MDC IDC MSMT LEADCHNL LV IMPEDANCE VALUE: 1125 Ohm
MDC IDC MSMT LEADCHNL LV PACING THRESHOLD PULSEWIDTH: 0.5 ms
MDC IDC MSMT LEADCHNL RA IMPEDANCE VALUE: 490 Ohm
MDC IDC MSMT LEADCHNL RA PACING THRESHOLD AMPLITUDE: 0.5 V
MDC IDC MSMT LEADCHNL RV PACING THRESHOLD AMPLITUDE: 1.375 V
MDC IDC PG MODEL: 3265

## 2014-10-19 NOTE — Telephone Encounter (Signed)
Called patient to assess HF s/s- HF diagnostic abnormal x 9days on remote transmission. Patient reports normal activity with no SOB, no LE edema. He reports minimal weight fluctuation (2 pounds day- to- day) and no change in his medication regimen. Patient instructed to call if he noticed increasing weight fluctuation or SOB- pt reports understanding.

## 2014-10-19 NOTE — Progress Notes (Signed)
Remote ICD transmission.   

## 2014-10-23 ENCOUNTER — Telehealth (HOSPITAL_COMMUNITY): Payer: Self-pay | Admitting: Vascular Surgery

## 2014-10-23 DIAGNOSIS — I5022 Chronic systolic (congestive) heart failure: Secondary | ICD-10-CM

## 2014-10-23 MED ORDER — FUROSEMIDE 20 MG PO TABS: ORAL_TABLET | ORAL | Status: DC

## 2014-10-23 NOTE — Telephone Encounter (Signed)
Spoke w/pt, he states he noticed yesterday while he was hauling wood he was very SOB which is very unusual for him, he states that he does weigh himself daily and wt is up 2 lbs today from yesterday.  He states moving around his house this AM he is not having any SOB, he denies edema.  Pt had remote transmission last week which did show his Corvue was elevated and when Dr Jackalyn Lombard office contacted him with this information he was symptomatic, however now he is having SOB and is concerned.  He is taking Lasix 20 mg Twice daily and Spiro 25 mg daily.  Will send to MD for review and call pt back

## 2014-10-23 NOTE — Telephone Encounter (Signed)
Pt aware and agreeable, labs sch for 4/13, if not improving he will call back

## 2014-10-23 NOTE — Telephone Encounter (Signed)
PT left message he would like to speak to [facility] , he is having severe SOB, gained 2 lbs over night .Marland Kitchen Please advise

## 2014-10-23 NOTE — Telephone Encounter (Signed)
Increase lasix to 40 mg bid x 4 days then 40 qam/20 qpm with BMET in 1 week.  Followup in the next week or so.

## 2014-10-26 ENCOUNTER — Encounter: Payer: Self-pay | Admitting: Cardiology

## 2014-11-01 ENCOUNTER — Ambulatory Visit (HOSPITAL_COMMUNITY)
Admission: RE | Admit: 2014-11-01 | Discharge: 2014-11-01 | Disposition: A | Discharge status: Home / Self Care | Payer: BLUE CROSS/BLUE SHIELD | Source: Ambulatory Visit | Attending: Internal Medicine | Admitting: Internal Medicine

## 2014-11-01 DIAGNOSIS — I5022 Chronic systolic (congestive) heart failure: Secondary | ICD-10-CM | POA: Diagnosis not present

## 2014-11-01 LAB — BASIC METABOLIC PANEL
Anion gap: 5 (ref 5–15)
BUN: 11 mg/dL (ref 6–23)
CALCIUM: 9.2 mg/dL (ref 8.4–10.5)
CO2: 32 mmol/L (ref 19–32)
CREATININE: 1 mg/dL (ref 0.50–1.35)
Chloride: 101 mmol/L (ref 96–112)
GFR calc Af Amer: 83 mL/min — ABNORMAL LOW (ref >90)
GFR, EST NON AFRICAN AMERICAN: 72 mL/min — AB (ref >90)
Glucose, Bld: 116 mg/dL — ABNORMAL HIGH (ref 70–99)
Potassium: 4.9 mmol/L (ref 3.5–5.1)
Sodium: 138 mmol/L (ref 135–145)

## 2014-11-15 ENCOUNTER — Ambulatory Visit (HOSPITAL_COMMUNITY)
Admission: RE | Admit: 2014-11-15 | Discharge: 2014-11-15 | Disposition: A | Discharge status: Home / Self Care | Payer: BLUE CROSS/BLUE SHIELD | Source: Ambulatory Visit | Attending: Internal Medicine | Admitting: Internal Medicine

## 2014-11-15 DIAGNOSIS — I5022 Chronic systolic (congestive) heart failure: Secondary | ICD-10-CM | POA: Insufficient documentation

## 2014-11-15 LAB — BASIC METABOLIC PANEL
ANION GAP: 7 (ref 5–15)
BUN: 17 mg/dL (ref 6–23)
CO2: 28 mmol/L (ref 19–32)
Calcium: 8.9 mg/dL (ref 8.4–10.5)
Chloride: 103 mmol/L (ref 96–112)
Creatinine, Ser: 0.9 mg/dL (ref 0.50–1.35)
GFR calc Af Amer: >90 mL/min (ref >90)
GFR, EST NON AFRICAN AMERICAN: 82 mL/min — AB (ref >90)
Glucose, Bld: 124 mg/dL — ABNORMAL HIGH (ref 70–99)
Potassium: 4.5 mmol/L (ref 3.5–5.1)
Sodium: 138 mmol/L (ref 135–145)

## 2014-11-16 NOTE — Progress Notes (Signed)
Looks like this patient is followed in [facility]

## 2014-12-20 ENCOUNTER — Encounter: Payer: Self-pay | Admitting: Internal Medicine

## 2015-01-18 ENCOUNTER — Ambulatory Visit: Payer: BLUE CROSS/BLUE SHIELD | Admitting: *Deleted

## 2015-02-01 ENCOUNTER — Telehealth: Payer: Self-pay | Admitting: Internal Medicine

## 2015-02-01 NOTE — Telephone Encounter (Signed)
New message    Pt had remote check on 6-30 and has not heard results from the check.   Please call to discuss

## 2015-02-01 NOTE — Telephone Encounter (Signed)
Follow Up ° °Pt returned call//  °

## 2015-02-01 NOTE — Telephone Encounter (Signed)
Spoke w/ device tech and she informed me that pt remote transmission from 6-30 -16 was normal. Attempted to call pt and inform him of these results. No answer LMOVM for pt to return call.

## 2015-02-01 NOTE — Telephone Encounter (Signed)
Informed pt that per appt notes if his July recall was scheduled his remote check was suppose to be canceled. Pt verbalized understanding and is aware of Aug appt w/ MD.

## 2015-02-19 ENCOUNTER — Encounter: Payer: Self-pay | Admitting: Internal Medicine

## 2015-02-19 ENCOUNTER — Ambulatory Visit (INDEPENDENT_AMBULATORY_CARE_PROVIDER_SITE_OTHER): Payer: BLUE CROSS/BLUE SHIELD | Admitting: Internal Medicine

## 2015-02-19 ENCOUNTER — Other Ambulatory Visit: Payer: Self-pay

## 2015-02-19 VITALS — BP 132/80 | HR 77 | Ht 70.5 in | Wt 218.0 lb

## 2015-02-19 DIAGNOSIS — Z95810 Presence of automatic (implantable) cardiac defibrillator: Secondary | ICD-10-CM | POA: Diagnosis not present

## 2015-02-19 DIAGNOSIS — I110 Hypertensive heart disease with heart failure: Secondary | ICD-10-CM

## 2015-02-19 DIAGNOSIS — I10 Essential (primary) hypertension: Secondary | ICD-10-CM

## 2015-02-19 DIAGNOSIS — I428 Other cardiomyopathies: Secondary | ICD-10-CM

## 2015-02-19 DIAGNOSIS — I429 Cardiomyopathy, unspecified: Secondary | ICD-10-CM | POA: Diagnosis not present

## 2015-02-19 DIAGNOSIS — I5022 Chronic systolic (congestive) heart failure: Secondary | ICD-10-CM

## 2015-02-19 DIAGNOSIS — I509 Heart failure, unspecified: Secondary | ICD-10-CM

## 2015-02-19 LAB — CUP PACEART INCLINIC DEVICE CHECK
Brady Statistic RA Percent Paced: 1.6 %
Brady Statistic RV Percent Paced: 95 %
Date Time Interrogation Session: 20160801134031
HighPow Impedance: 75.375
Lead Channel Impedance Value: 1112.5 Ohm
Lead Channel Impedance Value: 400 Ohm
Lead Channel Pacing Threshold Amplitude: 1.375 V
Lead Channel Pacing Threshold Pulse Width: 0.5 ms
Lead Channel Pacing Threshold Pulse Width: 0.5 ms
Lead Channel Sensing Intrinsic Amplitude: 12 mV
Lead Channel Setting Pacing Amplitude: 1.5 V
Lead Channel Setting Pacing Amplitude: 2.125
Lead Channel Setting Pacing Amplitude: 2.375
Lead Channel Setting Pacing Pulse Width: 0.5 ms
MDC IDC MSMT BATTERY REMAINING LONGEVITY: 50.4 mo
MDC IDC MSMT LEADCHNL LV PACING THRESHOLD AMPLITUDE: 1.125 V
MDC IDC MSMT LEADCHNL RA IMPEDANCE VALUE: 487.5 Ohm
MDC IDC MSMT LEADCHNL RA PACING THRESHOLD AMPLITUDE: 0.5 V
MDC IDC MSMT LEADCHNL RA PACING THRESHOLD PULSEWIDTH: 0.5 ms
MDC IDC MSMT LEADCHNL RA SENSING INTR AMPL: 1 mV
MDC IDC PG MODEL: 3265
MDC IDC PG SERIAL: 7021876
MDC IDC SET LEADCHNL RV PACING PULSEWIDTH: 0.5 ms
MDC IDC SET LEADCHNL RV SENSING SENSITIVITY: 0.5 mV
MDC IDC SET ZONE DETECTION INTERVAL: 350 ms
Zone Setting Detection Interval: 280 ms

## 2015-02-19 NOTE — Progress Notes (Signed)
Primary Cardiologist: Pierre Bali, MD  [name] is a 74 y.o. male who presents today for routine electrophysiology followup.  Since last being seen in our clinic, the patient reports doing very well.  He is very active walking 2 miles a day on the treadmill and gardening.   Today, he denies symptoms of palpitations, chest pain, shortness of breath,  lower extremity edema, dizziness, presyncope, syncope, or ICD shocks.  The patient is otherwise without complaint today.   Past Medical History  Diagnosis Date  . Chronic systolic heart failure   . HTN (hypertension)   . Hyperlipidemia   . Erectile dysfunction   . Insomnia   . DCM (dilated cardiomyopathy)   . CHF (congestive heart failure)   . 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 Nasal sinus surgery  93, 97, 2010, 2006  . Cardiac defibrillator placement  02/13/2012    SJM Quadra Assura BIV ICD implanted by Dr Rayann Heman  . Bi-ventricular implantable cardioverter defibrillator N/A 02/13/2012    Procedure: BI-VENTRICULAR IMPLANTABLE CARDIOVERTER DEFIBRILLATOR  (CRT-D);  Surgeon: Thompson Grayer, MD;  Location: [facility] CATH LAB;  Service: Cardiovascular;  Laterality: N/A;    Current Outpatient Prescriptions  Medication Sig Dispense Refill  . amitriptyline (ELAVIL) 100 MG tablet Take 100 mg by mouth at bedtime.      Marland Kitchen aspirin 81 MG tablet Take 81 mg by mouth daily.      . carvedilol (COREG) 25 MG tablet Take 1 tablet (25 mg total) by mouth 2 (two) times daily. 180 tablet 3  . furosemide (LASIX) 20 MG tablet Take 20 mg by mouth 2 (two) times daily.     Marland Kitchen LORazepam (ATIVAN) 0.5 MG tablet Take 1 mg by mouth 2 (two) times daily as needed for anxiety.     Marland Kitchen losartan (COZAAR) 100 MG tablet Take 1 tablet in the AM and take 1/2 tablet by mouth in the PM    . Multiple Vitamins-Minerals (MULTIVITAMIN WITH MINERALS) tablet Take 1 tablet by  mouth daily.      . penicillin v potassium (VEETID) 500 MG tablet Take 1 tablet by mouth 4 (four) times daily.  1  . rosuvastatin (CRESTOR) 40 MG tablet Take 20 mg by mouth daily.     Marland Kitchen spironolactone (ALDACTONE) 25 MG tablet Take 25 mg by mouth 2 (two) times daily.    Earnestine Mealing 625 MG tablet Take 3 tablets by mouth 2 (two) times daily.  3  . zolpidem (AMBIEN) 10 MG tablet Take 1 tablet by mouth at bedtime as needed for sleep.     . [DISCONTINUED] albuterol (PROAIR HFA) 108 (90 BASE) MCG/ACT inhaler Inhale 2 puffs into the lungs as needed. 1 Inhaler 3   No current facility-administered medications for this visit.    Physical Exam: Filed Vitals:   02/19/15 0933  BP: 132/80  Pulse: 77  Height: 5' 10.5" (1.791 m)  Weight: 98.884 kg (218 lb)    GEN- The patient is well appearing, alert and oriented x 3 today.   Head- normocephalic, atraumatic Eyes-  Sclera clear, conjunctiva pink Ears- hearing intact Oropharynx- clear Lungs- Clear to ausculation bilaterally, normal work of breathing Chest- ICD pocket is well healed Heart- Regular rate and rhythm, no murmurs, rubs or gallops, PMI not laterally displaced GI- soft, NT, ND, + BS Extremities- no clubbing, cyanosis, or edema  ICD interrogation- reviewed  in detail today,  See PACEART report ekg today reveals sinus with BiV pacing  Assessment and Plan:  1. Chronic systolic dysfunction Much improved with CRT Normal BiV ICD function See Pace Art report No changes today Will enroll in the [facility] clinic to follow with Sharman Cheek  2. HTN Stable No change required today  Merlin checks Return in 1 year

## 2015-02-19 NOTE — Patient Instructions (Signed)
Medication Instructions:  Your physician recommends that you continue on your current medications as directed. Please refer to the Current Medication list given to you today.   Labwork: None ordered  Testing/Procedures: None ordered  Follow-Up: Your physician wants you to follow-up in: 12 months with Chanetta Marshall, NP You will receive a reminder letter in the mail two months in advance. If you don't receive a letter, please call our office to schedule the follow-up appointment.  Remote monitoring is used to monitor your  ICD from home. This monitoring reduces the number of office visits required to check your device to one time per year. It allows [place] to keep an eye on the functioning of your device to ensure it is working properly. You are scheduled for a device check from home on 05/21/15. You may send your transmission at any time that day. If you have a wireless device, the transmission will be sent automatically. After your physician reviews your transmission, you will receive a postcard with your next transmission date.   ICM clinic-- will get a call with the date     Any Other Special Instructions Will Be Listed Below (If Applicable).

## 2015-04-02 ENCOUNTER — Telehealth: Payer: Self-pay

## 2015-04-02 NOTE — Telephone Encounter (Signed)
Returned patient call.  He had questions regarding HF program.  Explained program and monitoring of fluid levels on a monthly basis.  Explained he is scheduled for 04/05/2015 to transmit and will be contacted to discuss the results.  He agreed to monthly follow ups.

## 2015-04-04 ENCOUNTER — Ambulatory Visit (INDEPENDENT_AMBULATORY_CARE_PROVIDER_SITE_OTHER): Payer: BLUE CROSS/BLUE SHIELD

## 2015-04-04 DIAGNOSIS — I5022 Chronic systolic (congestive) heart failure: Secondary | ICD-10-CM | POA: Diagnosis not present

## 2015-04-04 DIAGNOSIS — Z95810 Presence of automatic (implantable) cardiac defibrillator: Secondary | ICD-10-CM | POA: Diagnosis not present

## 2015-04-04 NOTE — Progress Notes (Signed)
EPIC Encounter for ICM Monitoring  Patient Name: Louis Juarez. is a 74 y.o. male Date: 04/04/2015 Primary Care Physican: Mayra Neer, MD Primary Cardiologist: [place] Electrophysiologist: Allred Dry Weight: 209.4 lbs       In the past month, have you:  1. Gained more than 2 pounds in a day or more than 5 pounds in a week? no  2. Had changes in your medications (with verification of current medications)? no  3. Had more shortness of breath than is usual for you? no  4. Limited your activity because of shortness of breath? no  5. Not been able to sleep because of shortness of breath? no  6. Had increased swelling in your feet or ankles? no  7. Had symptoms of dehydration (dizziness, dry mouth, increased thirst, decreased urine output) no  8. Had changes in sodium restriction? no  9. Been compliant with medication? Yes   ICM trend:   Follow-up plan: ICM clinic phone appointment 05/21/2015.  CorVue transmission trending along baseline.  Member denied any HF symptoms.  Patient complains he is receiving email notifications everytime there is a change in appointment.  Explained how the software is set up for the office and each time there is a change it will notify him my chart but he can change preferences in my chart to stop notifications.  He does not feel the office staff cares about him because he is not called every time a cancellation or rescheduling occurs. Explained reason for changes in remote schedule and the connection to my chart.  Addressed patients concerns.    Copy of note sent to patient's primary care physician, primary cardiologist, and device following physician.  Rosalene Billings, RN, CCM 04/04/2015 1:55 PM

## 2015-05-21 ENCOUNTER — Ambulatory Visit (INDEPENDENT_AMBULATORY_CARE_PROVIDER_SITE_OTHER): Payer: BLUE CROSS/BLUE SHIELD

## 2015-05-21 DIAGNOSIS — Z95810 Presence of automatic (implantable) cardiac defibrillator: Secondary | ICD-10-CM

## 2015-05-21 DIAGNOSIS — I429 Cardiomyopathy, unspecified: Secondary | ICD-10-CM

## 2015-05-21 DIAGNOSIS — I5022 Chronic systolic (congestive) heart failure: Secondary | ICD-10-CM

## 2015-05-21 DIAGNOSIS — I428 Other cardiomyopathies: Secondary | ICD-10-CM

## 2015-05-21 NOTE — Progress Notes (Signed)
EPIC Encounter for ICM Monitoring  Patient Name: Louis Juarez. is a 74 y.o. male Date: 05/21/2015 Primary Care Physican: Mayra Neer, MD Primary Cardiologist: [place] Electrophysiologist: Allred Dry Weight: 209 lb       In the past month, have you:  1. Gained more than 2 pounds in a day or more than 5 pounds in a week? no  2. Had changes in your medications (with verification of current medications)? no  3. Had more shortness of breath than is usual for you? no  4. Limited your activity because of shortness of breath? no  5. Not been able to sleep because of shortness of breath? no  6. Had increased swelling in your feet or ankles? no  7. Had symptoms of dehydration (dizziness, dry mouth, increased thirst, decreased urine output) no  8. Had changes in sodium restriction? no  9. Been compliant with medication? Yes   ICM trend: 05/21/2015   Follow-up plan: ICM clinic phone appointment 07/09/2015.  Corvue impedance below baseline 05/04/2015 to 05/13/2015 and he denied any HF symptoms.  He reported he had allergies during that time and chest was congested (he visited PCP for allergies).  He will be taking out of town in December until 07/07/2015.  No changes today.    Copy of note sent to patient's primary care physician, primary cardiologist, and device following physician.  Rosalene Billings, RN, CCM 05/21/2015 10:12 AM

## 2015-05-25 LAB — CUP PACEART REMOTE DEVICE CHECK
Battery Remaining Longevity: 47 mo
Battery Remaining Percentage: 55 %
Battery Voltage: 2.93 V
Brady Statistic AS VP Percent: 94 %
HIGH POWER IMPEDANCE MEASURED VALUE: 73 Ohm
HighPow Impedance: 73 Ohm
Implantable Lead Implant Date: 20130726
Implantable Lead Implant Date: 20130726
Implantable Lead Implant Date: 20130726
Implantable Lead Location: 753858
Implantable Lead Location: 753860
Lead Channel Impedance Value: 1050 Ohm
Lead Channel Impedance Value: 400 Ohm
Lead Channel Pacing Threshold Pulse Width: 0.5 ms
Lead Channel Pacing Threshold Pulse Width: 0.5 ms
Lead Channel Sensing Intrinsic Amplitude: 12 mV
Lead Channel Sensing Intrinsic Amplitude: 3 mV
Lead Channel Setting Pacing Amplitude: 1.5 V
Lead Channel Setting Pacing Amplitude: 2.375
Lead Channel Setting Pacing Amplitude: 2.5 V
MDC IDC LEAD LOCATION: 753859
MDC IDC MSMT LEADCHNL LV PACING THRESHOLD AMPLITUDE: 1.375 V
MDC IDC MSMT LEADCHNL RA IMPEDANCE VALUE: 510 Ohm
MDC IDC MSMT LEADCHNL RA PACING THRESHOLD AMPLITUDE: 0.5 V
MDC IDC MSMT LEADCHNL RV PACING THRESHOLD AMPLITUDE: 1.5 V
MDC IDC MSMT LEADCHNL RV PACING THRESHOLD PULSEWIDTH: 0.5 ms
MDC IDC SESS DTM: 20161031060024
MDC IDC SET LEADCHNL LV PACING PULSEWIDTH: 0.5 ms
MDC IDC SET LEADCHNL RV PACING PULSEWIDTH: 0.5 ms
MDC IDC SET LEADCHNL RV SENSING SENSITIVITY: 0.5 mV
MDC IDC STAT BRADY AP VP PERCENT: 3.1 %
MDC IDC STAT BRADY AP VS PERCENT: 1 %
MDC IDC STAT BRADY AS VS PERCENT: 1.6 %
MDC IDC STAT BRADY RA PERCENT PACED: 2 %
Pulse Gen Serial Number: 7021876

## 2015-05-30 ENCOUNTER — Encounter: Payer: Self-pay | Admitting: Cardiology

## 2015-07-09 ENCOUNTER — Ambulatory Visit (INDEPENDENT_AMBULATORY_CARE_PROVIDER_SITE_OTHER): Payer: BLUE CROSS/BLUE SHIELD | Admitting: *Deleted

## 2015-07-09 DIAGNOSIS — Z95810 Presence of automatic (implantable) cardiac defibrillator: Secondary | ICD-10-CM

## 2015-07-09 DIAGNOSIS — I5022 Chronic systolic (congestive) heart failure: Secondary | ICD-10-CM | POA: Diagnosis not present

## 2015-07-10 ENCOUNTER — Telehealth: Payer: Self-pay

## 2015-07-10 NOTE — Telephone Encounter (Signed)
ICM transmission received.  Attempted patient call and left message for return call.  

## 2015-07-10 NOTE — Progress Notes (Signed)
EPIC Encounter for ICM Monitoring  Patient Name: Louis Juarez. is a 74 y.o. male Date: 07/10/2015 Primary Care Physican: Mayra Neer, MD Primary Cardiologist: [place] Electrophysiologist: Allred Dry Weight: 207 lb       In the past month, have you:  1. Gained more than 2 pounds in a day or more than 5 pounds in a week? no  2. Had changes in your medications (with verification of current medications)? no  3. Had more shortness of breath than is usual for you? no  4. Limited your activity because of shortness of breath? no  5. Not been able to sleep because of shortness of breath? no  6. Had increased swelling in your feet or ankles? no  7. Had symptoms of dehydration (dizziness, dry mouth, increased thirst, decreased urine output) no  8. Had changes in sodium restriction? no  9. Been compliant with medication? Yes   ICM trend: 07/09/2015 3 month view   ICM Trend:  1 year view   Follow-up plan: ICM clinic phone appointment on 08/20/2015.  Corvue daily impedance above baseline ~06/22/2015 to 07/07/2015 suggesting dryness.  He reported he was on a long cruise and was limited in the water he was drinking.  He stated it was not readily accessible like it is at home.  He just got back 2 days ago and has increased fluid intake. He reported he feels fine and denied any problems at this time.  He confirmed he has the date of next transmission. No changes today.    Copy of note sent to patient's primary care physician, primary cardiologist, and device following physician.  Rosalene Billings, RN, CCM 07/10/2015 4:45 PM

## 2015-07-11 NOTE — Telephone Encounter (Signed)
Spoke with patient on 07/10/2015.

## 2015-08-06 ENCOUNTER — Other Ambulatory Visit (HOSPITAL_COMMUNITY): Payer: Self-pay | Admitting: Internal Medicine

## 2015-08-10 NOTE — Telephone Encounter (Signed)
[facility] [place]

## 2015-08-20 ENCOUNTER — Ambulatory Visit (INDEPENDENT_AMBULATORY_CARE_PROVIDER_SITE_OTHER): Payer: BLUE CROSS/BLUE SHIELD | Admitting: *Deleted

## 2015-08-20 DIAGNOSIS — Z95810 Presence of automatic (implantable) cardiac defibrillator: Secondary | ICD-10-CM

## 2015-08-20 DIAGNOSIS — I5022 Chronic systolic (congestive) heart failure: Secondary | ICD-10-CM | POA: Diagnosis not present

## 2015-08-20 DIAGNOSIS — I428 Other cardiomyopathies: Secondary | ICD-10-CM

## 2015-08-20 DIAGNOSIS — I429 Cardiomyopathy, unspecified: Secondary | ICD-10-CM | POA: Diagnosis not present

## 2015-08-20 NOTE — Progress Notes (Signed)
EPIC Encounter for ICM Monitoring  Patient Name: Louis Juarez. is a 75 y.o. male Date: 08/20/2015 Primary Care Physican: Mayra Neer, MD Primary Cardiologist: [place] Electrophysiologist: Allred Dry Weight: 207 lbs  Bi-V Pacing 98%       In the past month, have you:  1. Gained more than 2 pounds in a day or more than 5 pounds in a week? no  2. Had changes in your medications (with verification of current medications)? no  3. Had more shortness of breath than is usual for you? no  4. Limited your activity because of shortness of breath? no  5. Not been able to sleep because of shortness of breath? no  6. Had increased swelling in your feet or ankles? no  7. Had symptoms of dehydration (dizziness, dry mouth, increased thirst, decreased urine output) no  8. Had changes in sodium restriction? no  9. Been compliant with medication? Yes   ICM trend: 3 month view for 08/20/2015   ICM trend: 1 year view 08/20/2015   Follow-up plan: ICM clinic phone appointment 09/20/2015.  Corvue thoracic impedance above baseline 08/14/2015 to 08/16/2015 and 08/18/2015 to 08/20/2015 suggesting dryness.  He reported no symptoms at this time and feeling good.  Encouraged him to drink adequate amount of fluids daily.  He is very active with walking and has bought a bike to ride outside when weather is warmer.   Encouraged to call if he has any fluid symptoms.  No changes today.  Copy of note sent to patient's primary care physician, primary cardiologist, and device following physician.  Rosalene Billings, RN, CCM 08/20/2015 9:21 AM

## 2015-08-20 NOTE — Progress Notes (Signed)
Remote ICD transmission.   

## 2015-09-07 ENCOUNTER — Telehealth: Payer: Self-pay

## 2015-09-07 ENCOUNTER — Other Ambulatory Visit (HOSPITAL_COMMUNITY): Payer: Self-pay | Admitting: Internal Medicine

## 2015-09-07 NOTE — Telephone Encounter (Addendum)
Call to patient.  He reported he no longer wants to received monthly ICM follow up because he does not feel it is necessary.   He is writing a letter to [facility] because he does not understand the invoicing.  Advised he can have his device checked in the office every 3 months instead of doing remote transmission for device checks.  He is unsure what he wants to do every 3 month remote checks.  Advised I would no longer schedule him for ICM remote transmission to check his fluid levels.       Received voice mail message from patient to return call.

## 2015-09-08 LAB — CUP PACEART REMOTE DEVICE CHECK
Battery Voltage: 2.92 V
Brady Statistic AP VS Percent: 1 %
Brady Statistic AS VP Percent: 95 %
Brady Statistic RA Percent Paced: 1.8 %
Date Time Interrogation Session: 20170130070023
HighPow Impedance: 79 Ohm
HighPow Impedance: 79 Ohm
Implantable Lead Implant Date: 20130726
Implantable Lead Implant Date: 20130726
Implantable Lead Location: 753858
Implantable Lead Location: 753859
Lead Channel Impedance Value: 530 Ohm
Lead Channel Pacing Threshold Amplitude: 0.5 V
Lead Channel Pacing Threshold Pulse Width: 0.5 ms
Lead Channel Pacing Threshold Pulse Width: 0.5 ms
Lead Channel Sensing Intrinsic Amplitude: 1.7 mV
Lead Channel Sensing Intrinsic Amplitude: 12 mV
Lead Channel Setting Pacing Amplitude: 1.5 V
Lead Channel Setting Pacing Amplitude: 2.25 V
Lead Channel Setting Pacing Amplitude: 2.5 V
Lead Channel Setting Pacing Pulse Width: 0.5 ms
Lead Channel Setting Pacing Pulse Width: 0.5 ms
MDC IDC LEAD IMPLANT DT: 20130726
MDC IDC LEAD LOCATION: 753860
MDC IDC MSMT BATTERY REMAINING LONGEVITY: 44 mo
MDC IDC MSMT BATTERY REMAINING PERCENTAGE: 52 %
MDC IDC MSMT LEADCHNL LV IMPEDANCE VALUE: 1125 Ohm
MDC IDC MSMT LEADCHNL LV PACING THRESHOLD AMPLITUDE: 1.25 V
MDC IDC MSMT LEADCHNL RV IMPEDANCE VALUE: 410 Ohm
MDC IDC MSMT LEADCHNL RV PACING THRESHOLD AMPLITUDE: 1.5 V
MDC IDC MSMT LEADCHNL RV PACING THRESHOLD PULSEWIDTH: 0.5 ms
MDC IDC PG SERIAL: 7021876
MDC IDC SET LEADCHNL RV SENSING SENSITIVITY: 0.5 mV
MDC IDC STAT BRADY AP VP PERCENT: 2.6 %
MDC IDC STAT BRADY AS VS PERCENT: 1.4 %

## 2015-09-14 ENCOUNTER — Encounter: Payer: Self-pay | Admitting: Cardiology

## 2015-09-21 ENCOUNTER — Telehealth: Payer: Self-pay | Admitting: *Deleted

## 2015-09-21 NOTE — Telephone Encounter (Signed)
Called patient in regards to letter he has sent to the office containing multiple questions and complaints.  I have tried to answer all of his questions to the best of my ability but some of his concerns have to do with his patient portal and how [facility]'s system " is not set up properly and is not efficient"   He wants to continue with his ICM and remote monitoring at this time but just needs clarification as to what is being done and would like to be able to see and read on his portal.  I let him know that unfortunately I can not change this and did not build the system.  He is better now and appreciated my time and call.

## 2015-10-04 ENCOUNTER — Other Ambulatory Visit (HOSPITAL_COMMUNITY): Payer: Self-pay | Admitting: Internal Medicine

## 2015-10-04 ENCOUNTER — Telehealth: Payer: Self-pay

## 2015-10-04 NOTE — Telephone Encounter (Signed)
Received voice mail message to return call regarding monthly ICM monitoring.  Attempted call back to patient and left message for return call.

## 2015-10-04 NOTE — Telephone Encounter (Signed)
Spoke with patient regarding resuming ICM monthly calls and he agreed.  Explained ICM is an option that can be stopped at his request anytime.  Remote transmission scheduled for 10/25/2015.

## 2015-10-05 ENCOUNTER — Other Ambulatory Visit (HOSPITAL_COMMUNITY): Payer: Self-pay | Admitting: Internal Medicine

## 2015-10-08 ENCOUNTER — Telehealth (HOSPITAL_COMMUNITY): Payer: Self-pay | Admitting: Vascular Surgery

## 2015-10-08 ENCOUNTER — Other Ambulatory Visit (HOSPITAL_COMMUNITY): Payer: Self-pay | Admitting: *Deleted

## 2015-10-08 MED ORDER — SPIRONOLACTONE 25 MG PO TABS: 25.0000 mg | ORAL_TABLET | Freq: Two times a day (BID) | ORAL | Status: DC

## 2015-10-08 NOTE — Telephone Encounter (Signed)
Pt needs sprionlactone refill

## 2015-10-09 NOTE — Telephone Encounter (Signed)
rx was sent in

## 2015-10-25 ENCOUNTER — Ambulatory Visit (INDEPENDENT_AMBULATORY_CARE_PROVIDER_SITE_OTHER): Payer: BLUE CROSS/BLUE SHIELD

## 2015-10-25 DIAGNOSIS — I5022 Chronic systolic (congestive) heart failure: Secondary | ICD-10-CM

## 2015-10-25 DIAGNOSIS — Z95810 Presence of automatic (implantable) cardiac defibrillator: Secondary | ICD-10-CM

## 2015-10-25 NOTE — Progress Notes (Signed)
EPIC Encounter for ICM Monitoring  Patient Name: Louis Juarez. is a 75 y.o. male Date: 10/25/2015 Primary Care Physican: Mayra Neer, MD Primary Cardiologist: [place] Electrophysiologist: Allred Dry Weight: 98 lb   In the past month, have you:  1. Gained more than 2 pounds in a day or more than 5 pounds in a week? no  2. Had changes in your medications (with verification of current medications)? no  3. Had more shortness of breath than is usual for you? no  4. Limited your activity because of shortness of breath? no  5. Not been able to sleep because of shortness of breath? no  6. Had increased swelling in your feet or ankles? no  7. Had symptoms of dehydration (dizziness, dry mouth, increased thirst, decreased urine output) no  8. Had changes in sodium restriction? no  9. Been compliant with medication? Yes   ICM trend: 3 month view for 10/25/2015   ICM trend: 1 year view for 10/25/2015   Follow-up plan: ICM clinic phone appointment on 11/26/2015.  Thoracic impedance above reference line several days in the last month suggesting dryness.  He reported when he is busy mowing the yard which takes about 4 hours that he does not drink enough fluids and he realizes he needs to increase the intake.  He reported he is feeling very well at this time.  No complaints.  Encouraged to call for any fluid symptoms.  No changes today.     Rosalene Billings, RN, CCM 10/25/2015 12:35 PM

## 2015-11-26 ENCOUNTER — Ambulatory Visit (INDEPENDENT_AMBULATORY_CARE_PROVIDER_SITE_OTHER): Payer: BLUE CROSS/BLUE SHIELD | Admitting: *Deleted

## 2015-11-26 ENCOUNTER — Telehealth: Payer: Self-pay

## 2015-11-26 DIAGNOSIS — I428 Other cardiomyopathies: Secondary | ICD-10-CM

## 2015-11-26 DIAGNOSIS — I5022 Chronic systolic (congestive) heart failure: Secondary | ICD-10-CM | POA: Diagnosis not present

## 2015-11-26 DIAGNOSIS — I429 Cardiomyopathy, unspecified: Secondary | ICD-10-CM

## 2015-11-26 DIAGNOSIS — Z95810 Presence of automatic (implantable) cardiac defibrillator: Secondary | ICD-10-CM

## 2015-11-26 NOTE — Progress Notes (Addendum)
EPIC Encounter for ICM Monitoring  Patient Name: [place]. is a 75 y.o. male Date: 11/26/2015 Primary Care Physican: Mayra Neer, MD Primary Cardiologist: [place] Electrophysiologist: Allred Dry Weight: 210 lbs    Bi-V Pacing 98%      In the past month, have you:  1. Gained more than 2 pounds in a day or more than 5 pounds in a week? no  2. Had changes in your medications (with verification of current medications)? no  3. Had more shortness of breath than is usual for you? no  4. Limited your activity because of shortness of breath? no  5. Not been able to sleep because of shortness of breath? no  6. Had increased swelling in your feet, ankles, legs or stomach area? no  7. Had symptoms of dehydration (dizziness, dry mouth, increased thirst, decreased urine output) no  8. Had changes in sodium restriction? no  9. Been compliant with medication? Yes  ICM trend: 3 month view for 11/26/2015  ICM trend: 1 year view for 11/26/2015  Follow-up plan: ICM clinic phone appointment 12/27/2015.    FLUID LEVELS:  Corvue Optivol thoracic impedance decreased 11/02/2015 to 11/09/2015 and 11/20/2015 to 11/25/2015 suggesting fluid accumulation and returned to baseline suggesting stable fluid levels.   Patient report he and his wife do like to eat out and also his son, that is a Biomedical scientist, was home recently.  Patient stated his home phone was out of order and asked to have his cell phone included in contact information.  His cell number is [phone].  He reported his wife's cell number is [phone]. SYMPTOMS:    None.  Denied any symptoms such as weight gain, SOB and/or lower extremity swelling.   Encouraged to call for any fluid symptoms.  EDUCATION:   Encouraged him to limit sodium intake to < 2000 mg and fluid intake to 64 oz daily.   No changes today.     Rosalene Billings, RN, CCM 11/26/2015 3:23 PM

## 2015-11-26 NOTE — Progress Notes (Signed)
Remote ICD transmission.   

## 2015-11-26 NOTE — Telephone Encounter (Signed)
Attempted call to patient at home number to request to send ICM remote transmission but received fax noise after several rings.  Attempted call to mobile number and no voice mail has been set up yet.

## 2015-12-27 ENCOUNTER — Telehealth: Payer: Self-pay

## 2015-12-27 ENCOUNTER — Ambulatory Visit (INDEPENDENT_AMBULATORY_CARE_PROVIDER_SITE_OTHER): Payer: BLUE CROSS/BLUE SHIELD

## 2015-12-27 DIAGNOSIS — Z95810 Presence of automatic (implantable) cardiac defibrillator: Secondary | ICD-10-CM

## 2015-12-27 DIAGNOSIS — I5022 Chronic systolic (congestive) heart failure: Secondary | ICD-10-CM | POA: Diagnosis not present

## 2015-12-27 NOTE — Progress Notes (Signed)
EPIC Encounter for ICM Monitoring  Patient Name: [place]. is a 75 y.o. male Date: 12/27/2015 Primary Care Physican: Mayra Neer, MD Primary Cardiologist: [place] Electrophysiologist: Allred Dry Weight: 210 lbs   Bi-V Pacing 98%      In the past month, have you:  1. Gained more than 2 pounds in a day or more than 5 pounds in a week? no  2. Had changes in your medications (with verification of current medications)? no  3. Had more shortness of breath than is usual for you? no  4. Limited your activity because of shortness of breath? no  5. Not been able to sleep because of shortness of breath? no  6. Had increased swelling in your feet, ankles, legs or stomach area? no  7. Had symptoms of dehydration (dizziness, dry mouth, increased thirst, decreased urine output) no  8. Had changes in sodium restriction? no  9. Been compliant with medication? Yes  ICM trend: 3 month view for 12/27/2015   ICM trend: 1 year view for 12/27/2015   Follow-up plan: ICM clinic phone appointment 01/31/2016.    FLUID LEVELS:  Corvue thoracic impedance decreased 12/17/2015 to 12/20/2015 and 12/22/2015 to 12/24/2015 suggesting fluid accumulation and returned to baseline 12/24/2015.    SYMPTOMS:    He stated he has had some chest congestion but is from allergies and pollen.  He feels clear at this time and denied any symptoms such as weight gain of 3 pounds overnight or 5 pounds within a week, SOB and/or lower extremity swelling. Encouraged to call for any fluid symptoms.   EDUCATION: Limit sodium intake to < 2000 mg and fluid intake to 64 oz daily.     RECOMMENDATIONS: No changes today.      Rosalene Billings, RN, CCM 12/27/2015 9:29 AM

## 2015-12-27 NOTE — Telephone Encounter (Signed)
Attempted ICM call to patient to request to send manual ICM remote transmission.  Left message to return call.

## 2015-12-27 NOTE — Telephone Encounter (Signed)
Patient returned call and sent remote transmission.

## 2016-01-02 ENCOUNTER — Encounter: Payer: Self-pay | Admitting: Cardiology

## 2016-01-02 LAB — CUP PACEART REMOTE DEVICE CHECK
Battery Remaining Longevity: 42 mo
Brady Statistic AP VS Percent: 1 %
Brady Statistic AS VS Percent: 1.2 %
Date Time Interrogation Session: 20170508060016
HIGH POWER IMPEDANCE MEASURED VALUE: 80 Ohm
HighPow Impedance: 80 Ohm
Implantable Lead Implant Date: 20130726
Implantable Lead Location: 753859
Implantable Lead Location: 753860
Lead Channel Impedance Value: 1100 Ohm
Lead Channel Impedance Value: 490 Ohm
Lead Channel Pacing Threshold Amplitude: 1.125 V
Lead Channel Pacing Threshold Pulse Width: 0.5 ms
Lead Channel Pacing Threshold Pulse Width: 0.5 ms
Lead Channel Sensing Intrinsic Amplitude: 2.9 mV
Lead Channel Setting Pacing Amplitude: 2.125
Lead Channel Setting Pacing Amplitude: 2.25 V
Lead Channel Setting Pacing Pulse Width: 0.5 ms
MDC IDC LEAD IMPLANT DT: 20130726
MDC IDC LEAD IMPLANT DT: 20130726
MDC IDC LEAD LOCATION: 753858
MDC IDC MSMT BATTERY REMAINING PERCENTAGE: 49 %
MDC IDC MSMT BATTERY VOLTAGE: 2.92 V
MDC IDC MSMT LEADCHNL RA PACING THRESHOLD AMPLITUDE: 0.5 V
MDC IDC MSMT LEADCHNL RV IMPEDANCE VALUE: 400 Ohm
MDC IDC MSMT LEADCHNL RV PACING THRESHOLD AMPLITUDE: 1.25 V
MDC IDC MSMT LEADCHNL RV PACING THRESHOLD PULSEWIDTH: 0.5 ms
MDC IDC MSMT LEADCHNL RV SENSING INTR AMPL: 11.7 mV
MDC IDC PG SERIAL: 7021876
MDC IDC SET LEADCHNL LV PACING PULSEWIDTH: 0.5 ms
MDC IDC SET LEADCHNL RA PACING AMPLITUDE: 1.5 V
MDC IDC SET LEADCHNL RV SENSING SENSITIVITY: 0.5 mV
MDC IDC STAT BRADY AP VP PERCENT: 2 %
MDC IDC STAT BRADY AS VP PERCENT: 96 %
MDC IDC STAT BRADY RA PERCENT PACED: 1.5 %

## 2016-01-16 ENCOUNTER — Encounter: Payer: Self-pay | Admitting: Cardiology

## 2016-01-31 ENCOUNTER — Other Ambulatory Visit (HOSPITAL_COMMUNITY): Payer: Self-pay | Admitting: Cardiology

## 2016-01-31 ENCOUNTER — Ambulatory Visit (INDEPENDENT_AMBULATORY_CARE_PROVIDER_SITE_OTHER): Payer: BLUE CROSS/BLUE SHIELD

## 2016-01-31 DIAGNOSIS — Z95810 Presence of automatic (implantable) cardiac defibrillator: Secondary | ICD-10-CM | POA: Diagnosis not present

## 2016-01-31 DIAGNOSIS — I5022 Chronic systolic (congestive) heart failure: Secondary | ICD-10-CM | POA: Diagnosis not present

## 2016-01-31 NOTE — Progress Notes (Signed)
EPIC Encounter for ICM Monitoring  Patient Name: [place]. is a 75 y.o. male Date: 01/31/2016 Primary Care Physican: Mayra Neer, MD Primary Cardiologist: [place] Electrophysiologist: Allred Dry Weight: 210 lb  Bi-V Pacing:  98%       Heart Failure questions reviewed, pt asymptomatic. He reported he is feeling good at this time.   Thoracic impedence stable.  Recommendations: No changes.     ICM trend: 01/31/2016     Follow-up plan: ICM clinic phone appointment on 04/01/2016.  Office appointment with Chanetta Marshall, NP on 02/27/2016.   Copy of ICM check sent to device physician.   Rosalene Billings, RN 01/31/2016 10:20 AM

## 2016-02-26 NOTE — Progress Notes (Signed)
Electrophysiology Office Note Date: 02/27/2016  ID:  [place]., DOB 1941/04/18, MRN [id number]  PCP: Mayra Neer, MD Primary Cardiologist: [place] Electrophysiologist: [place]  CC: Routine ICD follow-up  [place]. is a 75 y.o. male seen today for Dr Rayann Heman. He presents today for routine electrophysiology followup.  Since last being seen in our clinic, the patient reports doing very well. He denies chest pain, palpitations, dyspnea, PND, orthopnea, nausea, vomiting, dizziness, syncope, edema, weight gain, or early satiety.  He has not had ICD shocks.  He remains active walking 2 miles per day on the treadmill.   Device History: STJ CRTD implanted 2013 for ICM,CHF History of appropriate therapy: no History of AAD therapy: no   Past Medical History:  Diagnosis Date  . 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 CHF (congestive heart failure) ([place])   . Chronic systolic heart failure ([place])   . DCM (dilated cardiomyopathy) ([place])   . Erectile dysfunction   . HTN (hypertension)   . Hyperlipidemia   . 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 Insomnia    Past Surgical History:  Procedure Laterality Date  . BI-VENTRICULAR IMPLANTABLE CARDIOVERTER DEFIBRILLATOR N/A 02/13/2012   Procedure: BI-VENTRICULAR IMPLANTABLE CARDIOVERTER DEFIBRILLATOR  (CRT-D);  Surgeon: Thompson Grayer, MD;  Location: [facility] CATH LAB;  Service: Cardiovascular;  Laterality: N/A;  . CARDIAC DEFIBRILLATOR PLACEMENT  02/13/2012   SJM Quadra Assura BIV ICD implanted by Dr Rayann Heman  . NASAL SINUS SURGERY  93, 97, 2010, 2006    Current Outpatient Prescriptions  Medication Sig Dispense Refill  . amitriptyline (ELAVIL) 100 MG tablet Take 100 mg by mouth at bedtime.      Marland Kitchen aspirin 81 MG tablet Take 81 mg by mouth daily.      . carvedilol (COREG) 25 MG tablet Take 1 tablet (25 mg total) by mouth 2 (two) times daily with a meal. 180 tablet 3  . furosemide (LASIX) 20 MG tablet TAKE 1 TABLET  (20 MG TOTAL) BY MOUTH 2 (TWO) TIMES DAILY. 180 tablet 2  . LORazepam (ATIVAN) 0.5 MG tablet Take 1 mg by mouth 2 (two) times daily as needed for anxiety.     Marland Kitchen losartan (COZAAR) 100 MG tablet TAKE 1 TABLET IN THE AM AND 1/2 TABLET IN THE PM 135 tablet 3  . Multiple Vitamins-Minerals (MULTIVITAMIN WITH MINERALS) tablet Take 1 tablet by mouth daily.      . penicillin v potassium (VEETID) 500 MG tablet Take 1 tablet by mouth 4 (four) times daily.  1  . rosuvastatin (CRESTOR) 40 MG tablet Take 20 mg by mouth daily.     Marland Kitchen spironolactone (ALDACTONE) 25 MG tablet Take 1 tablet (25 mg total) by mouth 2 (two) times daily. 180 tablet 3  . WELCHOL 625 MG tablet Take 3 tablets by mouth 2 (two) times daily.  3  . zolpidem (AMBIEN) 10 MG tablet Take 1 tablet by mouth at bedtime as needed for sleep.      No current facility-administered medications for this visit.     Allergies:   Review of patient's allergies indicates no known allergies.   Social History: Social History   Social History  . Marital status: Married    Spouse name: N/A  . Number of children: N/A  . Years of education: N/A   Occupational History  . retired    Social History Main Topics  . Smoking status: Former Smoker    Packs/day: 1.50  Years: 20.00    Types: Cigarettes    Quit date: 07/21/2001  . Smokeless tobacco: Never Used  . Alcohol use No  . Drug use: No  . Sexual activity: Not Currently   Other Topics Concern  . Not on file   Social History Narrative   Lives in [place].  Retired from the [employer] x 6 years.  He is originally from [place] .  Married.  1 grown son.      Family History: Family History  Problem Relation Age of Onset  . Colon cancer Father   . Melanoma Mother   . Melanoma Sister     Review of Systems: All other systems reviewed and are otherwise negative except as noted above.   Physical Exam: VS:  BP 114/72 (BP Location: Left Arm, Patient Position: Sitting, Cuff Size:  Large)   Pulse 69   Ht 5' 10.5" (1.791 m)   Wt 220 lb 3.2 oz (99.9 kg)   BMI 31.15 kg/m  , BMI Body mass index is 31.15 kg/m.  GEN- The patient is well appearing, alert and oriented x 3 today.   HEENT: normocephalic, atraumatic; sclera clear, conjunctiva pink; hearing intact; oropharynx clear; neck supple  Lungs- Clear to ausculation bilaterally, normal work of breathing.  No wheezes, rales, rhonchi Heart- Regular rate and rhythm, no murmurs, rubs or gallops  GI- soft, non-tender, non-distended, bowel sounds present  Extremities- no clubbing, cyanosis, or edema; DP/PT/radial pulses 2+ bilaterally MS- no significant deformity or atrophy Skin- warm and dry, no rash or lesion; ICD pocket well healed Psych- euthymic mood, full affect Neuro- strength and sensation are intact  ICD interrogation- reviewed in detail today,  See PACEART report  EKG:  EKG is ordered today. The ekg ordered today shows sinus rhythm with CRT pacing   Recent Labs: No results found for requested labs within last 8760 hours.   Wt Readings from Last 3 Encounters:  02/27/16 220 lb 3.2 oz (99.9 kg)  02/19/15 218 lb (98.9 kg)  04/06/14 213 lb 4 oz (96.7 kg)     Other studies Reviewed: Additional studies/ records that were reviewed today include: Dr Jackalyn Lombard office notes  Assessment and Plan:  1.  Chronic systolic dysfunction euvolemic today Stable on an appropriate medical regimen Normal ICD function See Pace Art report No changes today Continue follow up in ICM clinic   2.  HTN Stable No change required today    Current medicines are reviewed at length with the patient today.   The patient does not have concerns regarding his medicines.  The following changes were made today:  none  Labs/ tests ordered today include:  Orders Placed This Encounter  Procedures  . EKG 12-Lead     Disposition:   Follow up with Dr Dannielle Burn 6 months, Delilah Shan, [facility] clinic, Dr Rayann Heman 1 year    Signed, Chanetta Marshall, NP 02/27/2016 10:20 AM  [facility] [address] [place] [address] [phone] (office) [phone] (fax

## 2016-02-27 ENCOUNTER — Encounter: Payer: Self-pay | Admitting: Internal Medicine

## 2016-02-27 ENCOUNTER — Encounter: Payer: Self-pay | Admitting: Nurse Practitioner

## 2016-02-27 ENCOUNTER — Ambulatory Visit (INDEPENDENT_AMBULATORY_CARE_PROVIDER_SITE_OTHER): Payer: BLUE CROSS/BLUE SHIELD | Admitting: Nurse Practitioner

## 2016-02-27 VITALS — BP 114/72 | HR 69 | Ht 70.5 in | Wt 220.2 lb

## 2016-02-27 DIAGNOSIS — I429 Cardiomyopathy, unspecified: Secondary | ICD-10-CM | POA: Diagnosis not present

## 2016-02-27 DIAGNOSIS — I5022 Chronic systolic (congestive) heart failure: Secondary | ICD-10-CM | POA: Diagnosis not present

## 2016-02-27 DIAGNOSIS — I428 Other cardiomyopathies: Secondary | ICD-10-CM

## 2016-02-27 NOTE — Patient Instructions (Addendum)
Medication Instructions:   Your physician recommends that you continue on your current medications as directed. Please refer to the Current Medication list given to you today.   If you need a refill on your cardiac medications before your next appointment, please call your pharmacy.  Labwork: NONE ORDER TODAY    Testing/Procedures: NONE ORDER TODAY   Follow-Up: Your physician wants you to follow-up in:  IN  [place].Marland Kitchen You will receive a reminder letter in the mail two months in advance. If you don't receive a letter, please call our office to schedule the follow-up appointment.   Remote monitoring is used to monitor your Pacemaker of ICD from home. This monitoring reduces the number of office visits required to check your device to one time per year. It allows [place] to keep an eye on the functioning of your device to ensure it is working properly. You are scheduled for a device check from home on . 05/29/2016.Marland KitchenMarland KitchenYou may send your transmission at any time that day. If you have a wireless device, the transmission will be sent automatically. After your physician reviews your transmission, you will receive a postcard with your next transmission date.  Your physician wants you to follow-up in: [place] will receive a reminder letter in the mail two months in advance. If you don't receive a letter, please call our office to schedule the follow-up appointment.     Any Other Special Instructions Will Be Listed Below (If Applicable).

## 2016-02-28 LAB — CUP PACEART INCLINIC DEVICE CHECK
Date Time Interrogation Session: 20170809122339
Implantable Lead Implant Date: 20130726
Implantable Lead Implant Date: 20130726
Implantable Lead Location: 753859
Implantable Lead Location: 753860
MDC IDC LEAD IMPLANT DT: 20130726
MDC IDC LEAD LOCATION: 753858
MDC IDC PG SERIAL: 7021876

## 2016-03-13 ENCOUNTER — Other Ambulatory Visit: Payer: Self-pay | Admitting: Family Medicine

## 2016-03-13 ENCOUNTER — Ambulatory Visit
Admission: RE | Admit: 2016-03-13 | Discharge: 2016-03-13 | Disposition: A | Discharge status: Home / Self Care | Payer: BLUE CROSS/BLUE SHIELD | Source: Ambulatory Visit | Attending: Family Medicine | Admitting: Family Medicine

## 2016-03-13 DIAGNOSIS — R042 Hemoptysis: Secondary | ICD-10-CM

## 2016-04-01 ENCOUNTER — Ambulatory Visit (INDEPENDENT_AMBULATORY_CARE_PROVIDER_SITE_OTHER): Payer: BLUE CROSS/BLUE SHIELD

## 2016-04-01 DIAGNOSIS — I5022 Chronic systolic (congestive) heart failure: Secondary | ICD-10-CM

## 2016-04-01 DIAGNOSIS — Z95810 Presence of automatic (implantable) cardiac defibrillator: Secondary | ICD-10-CM

## 2016-04-01 NOTE — Progress Notes (Signed)
EPIC Encounter for ICM Monitoring  Patient Name: [place]. is a 75 y.o. male Date: 04/01/2016 Primary Care Physican: Mayra Neer, MD Primary Cardiologist: [place] Electrophysiologist: Allred Dry Weight:  unknown Bi-V Pacing:  98%       Heart Failure questions reviewed, pt asymptomatic   Thoracic impedance normal on 03/31/2016.  Discussed decreased impedance episodes and he reported he has been congested in his chest in the last month but feeling fine now.    Recommendations: No changes.  Discussed reading labels for salt amounts and he stated most of the things they cook are fresh and not high in salt.   He reported he feels fine and did not not need any extra medication at this time.       Follow-up plan: ICM clinic phone appointment on 05/02/2016.  Copy of ICM check sent to primary cardiologist and device physician.   ICM trend: 04/01/2016      Rosalene Billings, RN 04/01/2016 8:44 AM

## 2016-05-02 ENCOUNTER — Ambulatory Visit (INDEPENDENT_AMBULATORY_CARE_PROVIDER_SITE_OTHER): Payer: BLUE CROSS/BLUE SHIELD

## 2016-05-02 DIAGNOSIS — Z95810 Presence of automatic (implantable) cardiac defibrillator: Secondary | ICD-10-CM

## 2016-05-02 DIAGNOSIS — I5022 Chronic systolic (congestive) heart failure: Secondary | ICD-10-CM | POA: Diagnosis not present

## 2016-05-02 NOTE — Progress Notes (Signed)
EPIC Encounter for ICM Monitoring  Patient Name: [place]. is a 75 y.o. male Date: 05/02/2016 Primary Care Physican: Mayra Neer, MD Primary Cardiologist:[place] Electrophysiologist: Allred Dry Weight:     unknown Bi-V Pacing:  98%               Heart Failure questions reviewed, pt asymptomatic   Thoracic impedance normal   Recommendations: No changes.  Advised to limit salt intake to 2000 mg daily.  Encouraged to call for fluid symptoms.    Follow-up plan: ICM clinic phone appointment on 06/02/2016.  Copy of ICM check sent to primary cardiologist and device physician.   ICM trend: 05/02/2016       Rosalene Billings, RN 05/02/2016 12:26 PM

## 2016-05-26 ENCOUNTER — Encounter: Payer: Self-pay | Admitting: Nurse Practitioner

## 2016-05-26 ENCOUNTER — Telehealth: Payer: Self-pay | Admitting: Nurse Practitioner

## 2016-05-26 NOTE — Telephone Encounter (Signed)
Spoke with patient who states he is very irritated with the [facility] system because of the numerous notifications he gets from [place] regarding overdue immunizations and device remote checks.  He states Sadie Haber is his PCP and therefore we should not be sending him any notifications about immunizations.  I advised that I am unable to control the notifications he gets from [place] and apologized for the inconvenience. I offered to get him the number for the MyChart help desk and he states he has called and has been transferred around for 30 minutes and no one ever helps him resolve the issue.  I advised that I will forward this to my supervisor to see if she can help resolve the issue. He states that if the issue is not resolved, he will dissolve his relationship with [place].  I apologized again for the inconvenience and he thanked me for the call. Routing message to Georgana Curio, RN Clinical manager and Janan Halter, RN for Dr. Rayann Heman and Buyer, retail.

## 2016-05-26 NOTE — Telephone Encounter (Signed)
Follow Up:    Please call,concerning the e-mails he receives.

## 2016-06-02 ENCOUNTER — Ambulatory Visit (INDEPENDENT_AMBULATORY_CARE_PROVIDER_SITE_OTHER): Payer: BLUE CROSS/BLUE SHIELD

## 2016-06-02 DIAGNOSIS — Z95810 Presence of automatic (implantable) cardiac defibrillator: Secondary | ICD-10-CM

## 2016-06-02 DIAGNOSIS — I5022 Chronic systolic (congestive) heart failure: Secondary | ICD-10-CM

## 2016-06-02 NOTE — Progress Notes (Signed)
EPIC Encounter for ICM Monitoring  Patient Name: [place]. is a 75 y.o. male Date: 06/02/2016 Primary Care Physican: Mayra Neer, MD Primary Cardiologist:[place] Electrophysiologist: Allred Dry Weight:unknown Bi-V Pacing: 99%      Heart Failure questions reviewed, pt feels some chest congestion and thinks it is related to allergies.   Thoracic impedance abnormal suggesting fluid accumulation.  Labs: 01/08/2016 Creatinine 0.88, BUN 15, Potassium 4.7, Sodium 138  Recommendations:   Patient reported he will take extra Furosemide 20 mg this afternoon and in the morning.     Follow-up plan: ICM clinic phone appointment on 06/09/2016 to recheck fluid levels.  Copy of ICM check sent to primary cardiologist and device physician.   ICM trend: 06/02/2016       Rosalene Billings, RN 06/02/2016 3:18 PM

## 2016-06-09 ENCOUNTER — Telehealth: Payer: Self-pay

## 2016-06-09 ENCOUNTER — Ambulatory Visit (INDEPENDENT_AMBULATORY_CARE_PROVIDER_SITE_OTHER): Payer: BLUE CROSS/BLUE SHIELD

## 2016-06-09 DIAGNOSIS — I5022 Chronic systolic (congestive) heart failure: Secondary | ICD-10-CM

## 2016-06-09 DIAGNOSIS — Z95810 Presence of automatic (implantable) cardiac defibrillator: Secondary | ICD-10-CM

## 2016-06-09 NOTE — Progress Notes (Signed)
Patient returned call.  He stated he is feeling fine and less congested.  Provided ICM number and encouraged to call for any fluid symptoms.

## 2016-06-09 NOTE — Progress Notes (Signed)
EPIC Encounter for ICM Monitoring  Patient Name: [place]. is a 75 y.o. male Date: 06/09/2016 Primary Care Physican: Mayra Neer, MD Primary Cardiologist:[place] Electrophysiologist: Allred Dry Weight:unknown Bi-V Pacing: 99%                   Attempted ICM call and unable to reach.  Transmission reviewed.   Thoracic impedance returned to normal after taking extra Furosemide x 2 days.   Follow-up plan: ICM clinic phone appointment on 07/10/2016.  Copy of ICM check sent to device physician.   ICM trend: 06/09/2016       Rosalene Billings, RN 06/09/2016 2:32 PM

## 2016-06-09 NOTE — Telephone Encounter (Signed)
Remote ICM transmission received.  Attempted patient call and left message  

## 2016-06-27 ENCOUNTER — Other Ambulatory Visit (HOSPITAL_COMMUNITY): Payer: Self-pay | Admitting: Internal Medicine

## 2016-07-10 ENCOUNTER — Ambulatory Visit (INDEPENDENT_AMBULATORY_CARE_PROVIDER_SITE_OTHER): Payer: BLUE CROSS/BLUE SHIELD

## 2016-07-10 DIAGNOSIS — Z95810 Presence of automatic (implantable) cardiac defibrillator: Secondary | ICD-10-CM

## 2016-07-10 DIAGNOSIS — I5022 Chronic systolic (congestive) heart failure: Secondary | ICD-10-CM | POA: Diagnosis not present

## 2016-07-10 NOTE — Progress Notes (Signed)
EPIC Encounter for ICM Monitoring  Patient Name: [place]. is a 75 y.o. male Date: 07/10/2016 Primary Care Physican: Mayra Neer, MD Primary Cardiologist:[place] Electrophysiologist: Allred Dry Weight:unknown Bi-V Pacing: 99%      Heart Failure questions reviewed, pt asymptomatic. He stated he is feeling good.     Thoracic impedance normal   Recommendations: No changes.  Reinforced to limit low salt food choices to 2000 mg day and limiting fluid intake to < 2 liters per day. Encouraged to call for fluid symptoms.    Follow-up plan: ICM clinic phone appointment on 08/12/2016.  Copy of ICM check sent to device physician.   ICM trend: 07/10/2016       Rosalene Billings, RN 07/10/2016 12:25 PM

## 2016-07-28 ENCOUNTER — Other Ambulatory Visit (HOSPITAL_COMMUNITY): Payer: Self-pay | Admitting: Internal Medicine

## 2016-08-12 ENCOUNTER — Ambulatory Visit (INDEPENDENT_AMBULATORY_CARE_PROVIDER_SITE_OTHER): Payer: BLUE CROSS/BLUE SHIELD

## 2016-08-12 DIAGNOSIS — I5022 Chronic systolic (congestive) heart failure: Secondary | ICD-10-CM

## 2016-08-12 DIAGNOSIS — Z95810 Presence of automatic (implantable) cardiac defibrillator: Secondary | ICD-10-CM | POA: Diagnosis not present

## 2016-08-14 ENCOUNTER — Telehealth: Payer: Self-pay

## 2016-08-14 NOTE — Progress Notes (Signed)
Patient returned call.  He stated he was doing well.  He denied any fluid symptoms.  Next ICM remote transmission 09/16/2016.

## 2016-08-14 NOTE — Progress Notes (Signed)
EPIC Encounter for ICM Monitoring  Patient Name: [place]. is a 76 y.o. male Date: 08/14/2016 Primary Care Physican: Mayra Neer, MD Primary Cardiologist:[place] Electrophysiologist: Allred Dry Weight:unknown Bi-V Pacing: 99%                                             Attempted call to patient and unable to reach.  Left message to return call.  Transmission reviewed.   Thoracic impedance normal   Recommendations:NONE - Unable to reach patient   Follow-up plan: ICM clinic phone appointment on 09/16/2016.  Copy of ICM check sent to device physician.   3 month ICM trend: 08/12/2016   1 Year ICM trend:      Rosalene Billings, RN 08/14/2016 2:15 PM

## 2016-08-14 NOTE — Telephone Encounter (Signed)
Remote ICM transmission received.  Attempted patient call and left message to return call.   

## 2016-08-25 ENCOUNTER — Other Ambulatory Visit (HOSPITAL_COMMUNITY): Payer: Self-pay | Admitting: Internal Medicine

## 2016-08-28 ENCOUNTER — Other Ambulatory Visit (HOSPITAL_COMMUNITY): Payer: Self-pay | Admitting: Internal Medicine

## 2016-09-02 ENCOUNTER — Other Ambulatory Visit (HOSPITAL_COMMUNITY): Payer: Self-pay | Admitting: *Deleted

## 2016-09-02 ENCOUNTER — Telehealth: Payer: Self-pay

## 2016-09-02 MED ORDER — LOSARTAN POTASSIUM 100 MG PO TABS: ORAL_TABLET | ORAL | 3 refills | Status: DC

## 2016-09-02 NOTE — Telephone Encounter (Addendum)
Patient left voice mail to return call about a billing question.   He said [employer] is denying the December and January service dates.  He stated the service is being billed with his wifes name and his DOB.  Advised I working with the billing department to get it corrected.  Advised correction has been made regarding the mix up with name and DOB.  December and January bills have been corrected and refiled.

## 2016-09-16 ENCOUNTER — Telehealth: Payer: Self-pay

## 2016-09-16 ENCOUNTER — Ambulatory Visit (INDEPENDENT_AMBULATORY_CARE_PROVIDER_SITE_OTHER): Payer: BLUE CROSS/BLUE SHIELD

## 2016-09-16 DIAGNOSIS — Z95810 Presence of automatic (implantable) cardiac defibrillator: Secondary | ICD-10-CM

## 2016-09-16 DIAGNOSIS — I5022 Chronic systolic (congestive) heart failure: Secondary | ICD-10-CM | POA: Diagnosis not present

## 2016-09-16 NOTE — Progress Notes (Signed)
EPIC Encounter for ICM Monitoring  Patient Name: [place]. is a 76 y.o. male Date: 09/16/2016 Primary Care Physican: Mayra Neer, MD Primary Cardiologist:[place] Electrophysiologist: Allred Dry Weight:unknown Bi-V Pacing: 99%      Attempted call to patient and unable to reach.  Left message to return call.  Transmission reviewed.    Thoracic impedance abnormal suggesting fluid accumulation but trending back toward baseline.  Prescribed dosage: Furosemide 20 mg 1 tablet twice a day.   Labs: 08/14/2016 Creatinine 0.98, BUN 14, Potassium 4.9, Sodium 138, EGFR 74-90  Recommendations: NONE - Unable to reach patient   Follow-up plan: ICM clinic phone appointment on 09/25/2016.  HF clinic appointment 09/17/2016  Copy of ICM check sent to cardiologist and device physician.   3 month ICM trend: 09/16/2016   1 Year ICM trend:      Rosalene Billings, RN 09/16/2016 8:17 AM

## 2016-09-16 NOTE — Telephone Encounter (Signed)
Remote ICM transmission received.  Attempted patient call and left message to return call.   

## 2016-09-16 NOTE — Progress Notes (Addendum)
Received call back and stated he is feeling ok but has had some congestion for last 2 days.  He has an appointment with [name] and will take extra Furosemide if needed tomorrow.  Will follow up with him next week to recheck fluid levels.

## 2016-09-17 ENCOUNTER — Ambulatory Visit (HOSPITAL_COMMUNITY)
Admission: RE | Admit: 2016-09-17 | Discharge: 2016-09-17 | Disposition: A | Discharge status: Home / Self Care | Payer: BLUE CROSS/BLUE SHIELD | Source: Ambulatory Visit | Attending: Family Medicine | Admitting: Family Medicine

## 2016-09-17 ENCOUNTER — Encounter (HOSPITAL_COMMUNITY): Payer: Self-pay | Admitting: Internal Medicine

## 2016-09-17 ENCOUNTER — Ambulatory Visit (HOSPITAL_BASED_OUTPATIENT_CLINIC_OR_DEPARTMENT_OTHER)
Admission: RE | Admit: 2016-09-17 | Discharge: 2016-09-17 | Disposition: A | Discharge status: Home / Self Care | Payer: BLUE CROSS/BLUE SHIELD | Source: Ambulatory Visit | Attending: Internal Medicine | Admitting: Internal Medicine

## 2016-09-17 VITALS — BP 138/72 | HR 63 | Wt 219.2 lb

## 2016-09-17 DIAGNOSIS — I5022 Chronic systolic (congestive) heart failure: Secondary | ICD-10-CM

## 2016-09-17 DIAGNOSIS — G4700 Insomnia, unspecified: Secondary | ICD-10-CM | POA: Diagnosis not present

## 2016-09-17 DIAGNOSIS — I420 Dilated cardiomyopathy: Secondary | ICD-10-CM | POA: Insufficient documentation

## 2016-09-17 DIAGNOSIS — I340 Nonrheumatic mitral (valve) insufficiency: Secondary | ICD-10-CM | POA: Diagnosis not present

## 2016-09-17 DIAGNOSIS — I2510 Atherosclerotic heart disease of native coronary artery without angina pectoris: Secondary | ICD-10-CM | POA: Insufficient documentation

## 2016-09-17 DIAGNOSIS — Z95810 Presence of automatic (implantable) cardiac defibrillator: Secondary | ICD-10-CM | POA: Diagnosis not present

## 2016-09-17 DIAGNOSIS — Z6831 Body mass index (BMI) 31.0-31.9, adult: Secondary | ICD-10-CM | POA: Insufficient documentation

## 2016-09-17 DIAGNOSIS — E669 Obesity, unspecified: Secondary | ICD-10-CM | POA: Insufficient documentation

## 2016-09-17 DIAGNOSIS — I10 Essential (primary) hypertension: Secondary | ICD-10-CM

## 2016-09-17 DIAGNOSIS — I501 Left ventricular failure: Secondary | ICD-10-CM | POA: Insufficient documentation

## 2016-09-17 DIAGNOSIS — Z7982 Long term (current) use of aspirin: Secondary | ICD-10-CM | POA: Insufficient documentation

## 2016-09-17 DIAGNOSIS — I110 Hypertensive heart disease with heart failure: Secondary | ICD-10-CM | POA: Insufficient documentation

## 2016-09-17 DIAGNOSIS — E785 Hyperlipidemia, unspecified: Secondary | ICD-10-CM | POA: Diagnosis not present

## 2016-09-17 LAB — BASIC METABOLIC PANEL
ANION GAP: 8 (ref 5–15)
BUN: 10 mg/dL (ref 6–20)
CHLORIDE: 102 mmol/L (ref 101–111)
CO2: 27 mmol/L (ref 22–32)
Calcium: 9.1 mg/dL (ref 8.9–10.3)
Creatinine, Ser: 0.93 mg/dL (ref 0.61–1.24)
GFR calc non Af Amer: >60 mL/min (ref >60)
GLUCOSE: 111 mg/dL — AB (ref 65–99)
POTASSIUM: 4.5 mmol/L (ref 3.5–5.1)
Sodium: 137 mmol/L (ref 135–145)

## 2016-09-17 LAB — BRAIN NATRIURETIC PEPTIDE: B Natriuretic Peptide: 32.3 pg/mL (ref 0.0–100.0)

## 2016-09-17 MED ORDER — PERFLUTREN LIPID MICROSPHERE
1.0000 mL | INTRAVENOUS | Status: AC | PRN
  Administered 2016-09-17: 2 mL via INTRAVENOUS
  Filled 2016-09-17: qty 10

## 2016-09-17 NOTE — Patient Instructions (Addendum)
Labs today  We will contact you in 1 year to schedule your next appointment.  

## 2016-09-17 NOTE — Progress Notes (Signed)
  Echocardiogram 2D Echocardiogram has been performed.  [name] 09/17/2016, 10:34 AM

## 2016-09-17 NOTE — Progress Notes (Signed)
Patient ID: Louis Juarez., male   DOB: [birth year], 76 y.o.   MRN: [id number]  PCP:  Dr. Serita Grammes Primary Cardiologist:  Dr. Jenkins Rouge (will not go back and see) Primary Electrophysiologist:  Dr. Rayann Heman    History of Present Illness: Louis Juarez. is a 76 y.o. male with h/o HTN, obesity, previous tobacco use (mild restriction on PFTs, no obstruction) and LBBB.  S/p St Jude CRT-D (7/13 followed by Dr. Rayann Heman)   He developed dyspnea/wheezing in summer of 2012. Referred to Dr. Gwenette Greet. Mild airway disease. Echocardiogram 06/30/11: EF 10-15%, inferior, septal, apical akinesis, mild LVH, severe LAE, mild RAE, moderately reduced RVSF, PASP 52.  Cath by Dr. Burt Knack. Mild non-obs CAD. LM 20-30%, LAD ok, LCx 30-40% RCA 30-40% prox 50% distal. LVEF 10-15%.  CPX test:  4/13: Spirometry: normal Peak VO2: 17.7 ml/kg/min predicted peak VO2: 86.5% (corrected to ideal weight VO2 21ml/kg/min) VE/VCO2 slope: 31.1 OUES: 2.11 Peak RER: 1.20 Ventilatory Threshold: 12.0 % predicted peak VO2: 58.7% VE/MVV: 64.9% PETCO2 at peak: 35 O2pulse: 15 % predicted O2pulse: 100%  Echo 7/13: EF 15%  Echo 12/13: 25-30% Echo 03/2013: EF 40-45% Echo 09/17/16 EF 45-50%   Follow up for Heart Failure: I have not seen him in almost 1.5 years. Feels very well but says for the last month has had nasal congestion. In am has some green sputum. Walking on TM 2 miles per day. Yesterday spread 300 pounds of grass seed and 4 yards of dirt. Denies SOB, orthopnea, CP or edema. Weight at home stable 207-215 lbs.   Labs (10/14): K 4.7, creatinine 0.96  Past Medical History:  Diagnosis Date  . 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 CHF (congestive heart failure) ([place])   . Chronic systolic heart failure ([place])   . DCM (dilated cardiomyopathy) ([place])   . Erectile dysfunction   . HTN (hypertension)   . Hyperlipidemia   . 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 Insomnia     Current Outpatient  Prescriptions  Medication Sig Dispense Refill  . amitriptyline (ELAVIL) 100 MG tablet Take 100 mg by mouth at bedtime.      Marland Kitchen aspirin 81 MG tablet Take 81 mg by mouth daily.      . carvedilol (COREG) 25 MG tablet TAKE 1 TABLET (25 MG TOTAL) BY MOUTH 2 (TWO) TIMES DAILY WITH A MEAL. 180 tablet 3  . ezetimibe (ZETIA) 10 MG tablet Take 10 mg by mouth daily.    . furosemide (LASIX) 20 MG tablet TAKE 1 TABLET (20 MG TOTAL) BY MOUTH 2 (TWO) TIMES DAILY. 180 tablet 2  . LORazepam (ATIVAN) 0.5 MG tablet Take 1 mg by mouth 2 (two) times daily as needed for anxiety.     Marland Kitchen losartan (COZAAR) 100 MG tablet Take 1 Tablet in the AM and 1/2 Tablet in the PM 135 tablet 3  . Multiple Vitamins-Minerals (MULTIVITAMIN WITH MINERALS) tablet Take 1 tablet by mouth daily.      . pravastatin (PRAVACHOL) 80 MG tablet Take 80 mg by mouth daily.    Marland Kitchen spironolactone (ALDACTONE) 25 MG tablet Take 1 tablet (25 mg total) by mouth 2 (two) times daily. 180 tablet 3  . WELCHOL 625 MG tablet Take 3 tablets by mouth 2 (two) times daily.  3  . zolpidem (AMBIEN) 10 MG tablet Take 1 tablet by mouth at bedtime as needed for sleep.      No current facility-administered medications for this encounter.  Facility-Administered Medications Ordered in Other Encounters  Medication Dose Route Frequency Provider Last Rate Last Dose  . perflutren lipid microspheres (DEFINITY) IV suspension  1-10 mL Intravenous PRN Jolaine Artist, MD        Allergies: No Known Allergies  ROS:  Please see the history of present illness.   All other systems reviewed and negative.   Vitals:   09/17/16 1032  BP: 138/72  Pulse: 63  SpO2: 99%  Weight: 219 lb 4 oz (99.5 kg)   PHYSICAL EXAM: General: Well nourished, well developed, in no acute distress  HEENT: normal  Neck: no JVD  Cardiac:  PMI laterally displaced. RRR; no murmur. No s3  Lungs:  clear to auscultation bilaterally, mildly reduced BS. no wheezing, rhonchi or rales  Abd: soft, obese.  nontender, no hepatomegaly  Ext: no edema  Skin: warm and dry  Neuro:  CNs 2-12 intact, no focal abnormalities noted  ASSESSMENT AND PLAN:   1. Chronic systolic HF: NICM, EF [id number] (03/2013) s/p St Jude Biv ICD. EF today.  - Echo reviewed personally with him today EF 50%  - NYHA I symptoms and volume status stable on exam but slightly up by [place]. Will continue lasix 20 mg BID. Take extra lasix 20 today - Continue coreg 25 mg BID, losartan 150 mg daily and spironolactone 25 mg daily. - Reinforced the need and importance of daily weights, a low sodium diet, and fluid restriction (less than 2 L a day). Instructed to call the HF clinic if weight increases more than 3 lbs overnight or 5 lbs in a week.  2. Nonobstructive CAD: no s/s of ischemia. Continue ASA, statin and BB.  3. HTN: Controlled on current regimen.  SBP here in 130s but at home had been in the 110 range.   Bensimhon, Daniel,MD 10:43 AM

## 2016-09-17 NOTE — Addendum Note (Signed)
Encounter addended by: Scarlette Calico, RN on: 09/17/2016 11:10 AM<BR>    Actions taken: Order list changed, Diagnosis association updated, Sign clinical note

## 2016-09-22 ENCOUNTER — Telehealth: Payer: Self-pay

## 2016-09-22 NOTE — Telephone Encounter (Signed)
Received call from patient.  He reported he is still trying to get the billing corrected for January ICM remote monitoring. Bill was sent to [facility] with his wifes name and his DOB. Advised that I worked directly with billing while on the phone with him 09/02/2016 and that was corrected and both Dec and Jan bills were resubmitted to [facility].   He said December bill did get paid but he knows he has a copay but has yet to receive an invoice.  He said he has tried to call billing but difficult to talk with someone.  He said he does not think January bill has been paid by [facility].  He said he will need to put the ICM remote transmission monitoring on hold until the billing is fixed.  Advised will send a message to billing department.

## 2016-09-23 ENCOUNTER — Other Ambulatory Visit (HOSPITAL_COMMUNITY): Payer: Self-pay | Admitting: Internal Medicine

## 2016-09-23 NOTE — Telephone Encounter (Signed)
Returned patient's call and provided information regarding the January 2018 remote monitoring charge.  It was applied to his deductible.  He was concerned that he had not received a statement and I advised that it looked like one should go out within the next 7-10 days.  Gave him my direct contact info should he not receive statement.

## 2016-09-29 ENCOUNTER — Ambulatory Visit (INDEPENDENT_AMBULATORY_CARE_PROVIDER_SITE_OTHER): Payer: BLUE CROSS/BLUE SHIELD

## 2016-09-29 DIAGNOSIS — I5022 Chronic systolic (congestive) heart failure: Secondary | ICD-10-CM

## 2016-09-29 NOTE — Progress Notes (Signed)
EPIC Encounter for ICM Monitoring  Patient Name: Louis Juarez. is a 76 y.o. male Date: 09/29/2016 Primary Care Physican: Mayra Neer, MD Primary Cardiologist:Bensimhon Electrophysiologist: Allred Dry Weight:unknown Bi-V Pacing: 99%         Transmission reviewed   Thoracic impedance returned to normal.  Prescribed dosage: Furosemide 20 mg 1 tablet twice a day.   Labs: 08/14/2016 Creatinine 0.98, BUN 14, Potassium 4.9, Sodium 138, EGFR 74-90  Recommendations: None  Follow-up plan: ICM clinic phone appointment on 10/17/2016.  Copy of ICM check sent to device physician.   3 month ICM trend: 09/25/2016   1 Year ICM trend:      Rosalene Billings, RN 09/29/2016 8:22 AM

## 2016-10-17 ENCOUNTER — Telehealth: Payer: Self-pay

## 2016-10-17 ENCOUNTER — Ambulatory Visit (INDEPENDENT_AMBULATORY_CARE_PROVIDER_SITE_OTHER): Payer: BLUE CROSS/BLUE SHIELD | Admitting: *Deleted

## 2016-10-17 DIAGNOSIS — Z95810 Presence of automatic (implantable) cardiac defibrillator: Secondary | ICD-10-CM | POA: Diagnosis not present

## 2016-10-17 DIAGNOSIS — I5022 Chronic systolic (congestive) heart failure: Secondary | ICD-10-CM

## 2016-10-17 DIAGNOSIS — I428 Other cardiomyopathies: Secondary | ICD-10-CM | POA: Diagnosis not present

## 2016-10-17 NOTE — Telephone Encounter (Signed)
Remote ICM transmission received.  Attempted patient call and left message to return call.   

## 2016-10-17 NOTE — Progress Notes (Signed)
EPIC Encounter for ICM Monitoring  Patient Name: Louis Juarez. is a 75 y.o. male Date: 10/17/2016 Primary Care Physican: SHAW,KIMBERLEE, MD Primary Cardiologist:Bensimhon Electrophysiologist: Allred Dry Weight:unknown Bi-V Pacing: 99%       Attempted call to patient and unable to reach.  Left message to return call.  Transmission reviewed.    Thoracic impedance normal.  Prescribed dosage: Furosemide 20 mg 1 tablet twice a day.   Labs: 08/14/2016 Creatinine 0.98, BUN 14, Potassium 4.9, Sodium 138, EGFR 74-90  Recommendations: NONE - Unable to reach patient   Follow-up plan: ICM clinic phone appointment on 11/17/2016.    Copy of ICM check sent to device physician.   3 month ICM trend: 10/17/2016   1 Year ICM trend:      Laurie S Short, RN 10/17/2016 12:42 PM    

## 2016-10-17 NOTE — Progress Notes (Signed)
Remote ICD transmission.   

## 2016-10-20 ENCOUNTER — Encounter: Payer: Self-pay | Admitting: Cardiology

## 2016-10-20 LAB — CUP PACEART REMOTE DEVICE CHECK
Battery Remaining Longevity: 27 mo
Brady Statistic RA Percent Paced: 2 %
Date Time Interrogation Session: 20180402134515
HighPow Impedance: 86 Ohm
Implantable Lead Implant Date: 20130726
Implantable Lead Location: 753859
Implantable Pulse Generator Implant Date: 20130726
Lead Channel Impedance Value: 390 Ohm
Lead Channel Pacing Threshold Amplitude: 0.5 V
Lead Channel Pacing Threshold Amplitude: 1.375 V
Lead Channel Pacing Threshold Pulse Width: 0.5 ms
Lead Channel Sensing Intrinsic Amplitude: 0.8 mV
MDC IDC LEAD IMPLANT DT: 20130726
MDC IDC LEAD IMPLANT DT: 20130726
MDC IDC LEAD LOCATION: 753858
MDC IDC LEAD LOCATION: 753860
MDC IDC MSMT BATTERY REMAINING PERCENTAGE: 38 %
MDC IDC MSMT LEADCHNL LV IMPEDANCE VALUE: 1125 Ohm
MDC IDC MSMT LEADCHNL RA IMPEDANCE VALUE: 480 Ohm
MDC IDC MSMT LEADCHNL RA PACING THRESHOLD PULSEWIDTH: 0.5 ms
MDC IDC MSMT LEADCHNL RV PACING THRESHOLD AMPLITUDE: 1.75 V
MDC IDC MSMT LEADCHNL RV PACING THRESHOLD PULSEWIDTH: 0.5 ms
MDC IDC MSMT LEADCHNL RV SENSING INTR AMPL: 12 mV
Pulse Gen Serial Number: 7021876

## 2016-11-03 ENCOUNTER — Telehealth: Payer: Self-pay

## 2016-11-03 NOTE — Telephone Encounter (Signed)
Received call from patient.  He continues to have problems with billing and invoices.  He is upset that his bills and invoices are not sent to him in a timely manner.  He makes numerous calls to me and billing department about the invoices.  He reports he does not receive the invoices in the mail.  He is declining to have any further remote defibrillator checks and advised he would need to have the defibrillator check every 3 months in the office.  He stated he does not have to do anything and he will only deal with [facility] if he can be guaranteed that he will receive all bills and invoices within a months time of services rendered.  He reported he cannot continue with the stress of the billing department.   Advised I would inform Dr Rayann Heman that he is stopping al remote transmissions.  Advised will cancel ICM remote transmissions and will inform device clinic as well.

## 2016-11-04 ENCOUNTER — Telehealth: Payer: Self-pay

## 2016-11-04 NOTE — Telephone Encounter (Signed)
Received voice mail message from patient.  He apologized for being so upset about the billing and remotes transmissions.  He appreciated my professionalism and for all my help.  He did agree to do 3 month remote monitoring and thinks that will cut down on the billing problems.  Will route the updated information on remote monitoring to device clinic and Dr Rayann Heman.

## 2016-11-04 NOTE — Telephone Encounter (Signed)
91 day remote scheduled for 01-19-17

## 2017-01-19 ENCOUNTER — Ambulatory Visit (INDEPENDENT_AMBULATORY_CARE_PROVIDER_SITE_OTHER): Payer: BLUE CROSS/BLUE SHIELD | Admitting: *Deleted

## 2017-01-19 DIAGNOSIS — I428 Other cardiomyopathies: Secondary | ICD-10-CM

## 2017-01-19 DIAGNOSIS — I5022 Chronic systolic (congestive) heart failure: Secondary | ICD-10-CM

## 2017-01-19 NOTE — Progress Notes (Signed)
Remote ICD transmission.   

## 2017-01-20 ENCOUNTER — Encounter: Payer: Self-pay | Admitting: Cardiology

## 2017-01-20 LAB — CUP PACEART REMOTE DEVICE CHECK
Brady Statistic AP VS Percent: 1 %
Brady Statistic AS VS Percent: 1.5 %
HIGH POWER IMPEDANCE MEASURED VALUE: 87 Ohm
HighPow Impedance: 87 Ohm
Implantable Lead Implant Date: 20130726
Implantable Lead Location: 753858
Implantable Lead Location: 753859
Implantable Lead Location: 753860
Lead Channel Impedance Value: 1225 Ohm
Lead Channel Impedance Value: 490 Ohm
Lead Channel Pacing Threshold Amplitude: 0.5 V
Lead Channel Pacing Threshold Amplitude: 1.375 V
Lead Channel Pacing Threshold Amplitude: 1.75 V
Lead Channel Pacing Threshold Pulse Width: 0.5 ms
Lead Channel Setting Pacing Amplitude: 2.375
Lead Channel Setting Pacing Pulse Width: 0.5 ms
Lead Channel Setting Pacing Pulse Width: 0.5 ms
MDC IDC LEAD IMPLANT DT: 20130726
MDC IDC LEAD IMPLANT DT: 20130726
MDC IDC MSMT BATTERY REMAINING LONGEVITY: 29 mo
MDC IDC MSMT BATTERY REMAINING PERCENTAGE: 34 %
MDC IDC MSMT BATTERY VOLTAGE: 2.86 V
MDC IDC MSMT LEADCHNL LV PACING THRESHOLD PULSEWIDTH: 0.5 ms
MDC IDC MSMT LEADCHNL RA SENSING INTR AMPL: 1 mV
MDC IDC MSMT LEADCHNL RV IMPEDANCE VALUE: 410 Ohm
MDC IDC MSMT LEADCHNL RV PACING THRESHOLD PULSEWIDTH: 0.5 ms
MDC IDC MSMT LEADCHNL RV SENSING INTR AMPL: 12 mV
MDC IDC PG IMPLANT DT: 20130726
MDC IDC SESS DTM: 20180702080841
MDC IDC SET LEADCHNL RA PACING AMPLITUDE: 1.5 V
MDC IDC SET LEADCHNL RV PACING AMPLITUDE: 2.75 V
MDC IDC SET LEADCHNL RV SENSING SENSITIVITY: 0.5 mV
MDC IDC STAT BRADY AP VP PERCENT: 3.5 %
MDC IDC STAT BRADY AS VP PERCENT: 93 %
MDC IDC STAT BRADY RA PERCENT PACED: 1.9 %
Pulse Gen Serial Number: 7021876

## 2017-01-30 ENCOUNTER — Telehealth: Payer: Self-pay | Admitting: Cardiology

## 2017-01-30 NOTE — Telephone Encounter (Signed)
Spoke to patient about remote transmission. I told him that everything was stable. I also talked to him about his thoracic impedance measurement. Patient verbalized understanding and appreciation of information.

## 2017-01-30 NOTE — Telephone Encounter (Signed)
Patient called wanted the remote results that he received be explained to him. Please call him back.

## 2017-03-18 ENCOUNTER — Other Ambulatory Visit (HOSPITAL_COMMUNITY): Payer: Self-pay | Admitting: Internal Medicine

## 2017-03-25 ENCOUNTER — Encounter: Payer: BLUE CROSS/BLUE SHIELD | Admitting: Internal Medicine

## 2017-03-30 ENCOUNTER — Ambulatory Visit (INDEPENDENT_AMBULATORY_CARE_PROVIDER_SITE_OTHER): Payer: BLUE CROSS/BLUE SHIELD | Admitting: Internal Medicine

## 2017-03-30 ENCOUNTER — Encounter: Payer: Self-pay | Admitting: Internal Medicine

## 2017-03-30 VITALS — BP 120/78 | HR 61 | Ht 70.5 in | Wt 217.0 lb

## 2017-03-30 DIAGNOSIS — I5022 Chronic systolic (congestive) heart failure: Secondary | ICD-10-CM | POA: Diagnosis not present

## 2017-03-30 DIAGNOSIS — I10 Essential (primary) hypertension: Secondary | ICD-10-CM

## 2017-03-30 DIAGNOSIS — I428 Other cardiomyopathies: Secondary | ICD-10-CM | POA: Diagnosis not present

## 2017-03-30 LAB — CUP PACEART INCLINIC DEVICE CHECK
Battery Remaining Longevity: 27 mo
Brady Statistic RA Percent Paced: 2.4 %
Date Time Interrogation Session: 20180910095842
HighPow Impedance: 78.75 Ohm
Implantable Lead Implant Date: 20130726
Implantable Lead Location: 753859
Implantable Lead Location: 753860
Implantable Pulse Generator Implant Date: 20130726
Lead Channel Impedance Value: 1162.5 Ohm
Lead Channel Impedance Value: 412.5 Ohm
Lead Channel Pacing Threshold Amplitude: 0.75 V
Lead Channel Pacing Threshold Amplitude: 0.75 V
Lead Channel Pacing Threshold Amplitude: 1.25 V
Lead Channel Pacing Threshold Amplitude: 1.25 V
Lead Channel Pacing Threshold Pulse Width: 0.5 ms
Lead Channel Pacing Threshold Pulse Width: 0.5 ms
Lead Channel Pacing Threshold Pulse Width: 0.5 ms
Lead Channel Setting Pacing Amplitude: 3 V
Lead Channel Setting Pacing Pulse Width: 0.5 ms
Lead Channel Setting Sensing Sensitivity: 0.5 mV
MDC IDC LEAD IMPLANT DT: 20130726
MDC IDC LEAD IMPLANT DT: 20130726
MDC IDC LEAD LOCATION: 753858
MDC IDC MSMT LEADCHNL LV PACING THRESHOLD PULSEWIDTH: 0.5 ms
MDC IDC MSMT LEADCHNL RA IMPEDANCE VALUE: 487.5 Ohm
MDC IDC MSMT LEADCHNL RA SENSING INTR AMPL: 1.2 mV
MDC IDC MSMT LEADCHNL RV PACING THRESHOLD AMPLITUDE: 1.75 V
MDC IDC MSMT LEADCHNL RV PACING THRESHOLD AMPLITUDE: 1.75 V
MDC IDC MSMT LEADCHNL RV PACING THRESHOLD PULSEWIDTH: 0.5 ms
MDC IDC MSMT LEADCHNL RV PACING THRESHOLD PULSEWIDTH: 0.5 ms
MDC IDC MSMT LEADCHNL RV SENSING INTR AMPL: 11.7 mV
MDC IDC SET LEADCHNL LV PACING AMPLITUDE: 2.375
MDC IDC SET LEADCHNL LV PACING PULSEWIDTH: 0.5 ms
MDC IDC SET LEADCHNL RA PACING AMPLITUDE: 1.625
MDC IDC STAT BRADY RV PERCENT PACED: 96 %
Pulse Gen Serial Number: 7021876

## 2017-03-30 NOTE — Patient Instructions (Addendum)
Medication Instructions:  Your physician recommends that you continue on your current medications as directed. Please refer to the Current Medication list given to you today.   Labwork: None ordered   Testing/Procedures: None ordered   Follow-Up: Remote monitoring is used to monitor your  ICD from home. This monitoring reduces the number of office visits required to check your device to one time per year. It allows [place] to keep an eye on the functioning of your device to ensure it is working properly. You are scheduled for a device check from home on 04/20/17. You may send your transmission at any time that day. If you have a wireless device, the transmission will be sent automatically. After your physician reviews your transmission, you will receive a postcard with your next transmission date.    Your physician wants you to follow-up in: 12 months with Chanetta Marshall, NP You will receive a reminder letter in the mail two months in advance. If you don't receive a letter, please call our office to schedule the follow-up appointment.

## 2017-03-30 NOTE — Progress Notes (Signed)
PCP: Mayra Neer, MD Primary Cardiologist:  Bensimhon Primary EP: Dr Rayann Heman  [name] is a 76 y.o. male who presents today for routine electrophysiology followup.  Since last being seen in our clinic, the patient reports doing very well.  Today, he denies symptoms of palpitations, chest pain, shortness of breath,  lower extremity edema, dizziness, presyncope, syncope, or ICD shocks.  The patient is otherwise without complaint today.   Past Medical History:  Diagnosis Date  . 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 CHF (congestive heart failure) ([place])   . Chronic systolic heart failure ([place])   . DCM (dilated cardiomyopathy) ([place])   . Erectile dysfunction   . HTN (hypertension)   . Hyperlipidemia   . 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 Insomnia    Past Surgical History:  Procedure Laterality Date  . BI-VENTRICULAR IMPLANTABLE CARDIOVERTER DEFIBRILLATOR N/A 02/13/2012   Procedure: BI-VENTRICULAR IMPLANTABLE CARDIOVERTER DEFIBRILLATOR  (CRT-D);  Surgeon: Thompson Grayer, MD;  Location: [facility] CATH LAB;  Service: Cardiovascular;  Laterality: N/A;  . CARDIAC DEFIBRILLATOR PLACEMENT  02/13/2012   SJM Quadra Assura BIV ICD implanted by Dr Rayann Heman  . NASAL SINUS SURGERY  93, 97, 2010, 2006    ROS- all systems are reviewed and negative except as per HPI above  Current Outpatient Prescriptions  Medication Sig Dispense Refill  . amitriptyline (ELAVIL) 100 MG tablet Take 100 mg by mouth at bedtime.      Marland Kitchen aspirin 81 MG tablet Take 81 mg by mouth daily.      . carvedilol (COREG) 25 MG tablet TAKE 1 TABLET (25 MG TOTAL) BY MOUTH 2 (TWO) TIMES DAILY WITH A MEAL. 180 tablet 3  . Colesevelam HCl (WELCHOL PO) Take by mouth every morning. Take as directed    . Docusate Calcium (STOOL SOFTENER PO) Take 250 mg by mouth 2 (two) times daily.    Marland Kitchen ezetimibe (ZETIA) 10 MG tablet Take 10 mg by mouth daily.    . furosemide (LASIX) 20 MG tablet TAKE 1 TABLET (20 MG TOTAL) BY  MOUTH 2 (TWO) TIMES DAILY. 180 tablet 2  . LORazepam (ATIVAN) 1 MG tablet Take 1 mg by mouth 2 (two) times daily.    Marland Kitchen losartan (COZAAR) 100 MG tablet Take 1 Tablet in the AM and 1/2 Tablet in the PM 135 tablet 3  . Multiple Vitamins-Minerals (MULTIVITAMIN WITH MINERALS) tablet Take 1 tablet by mouth daily.      . pravastatin (PRAVACHOL) 80 MG tablet Take 80 mg by mouth daily.    Marland Kitchen spironolactone (ALDACTONE) 25 MG tablet Take 25 mg by mouth daily.    Marland Kitchen zolpidem (AMBIEN) 10 MG tablet Take 1 tablet by mouth at bedtime as needed for sleep.      No current facility-administered medications for this visit.     Physical Exam: Vitals:   03/30/17 0900  BP: 120/78  Pulse: 61  SpO2: 96%  Weight: 217 lb (98.4 kg)  Height: 5' 10.5" (1.791 m)    GEN- The patient is well appearing, alert and oriented x 3 today.   Head- normocephalic, atraumatic Eyes-  Sclera clear, conjunctiva pink Ears- hearing intact Oropharynx- clear Lungs- Clear to ausculation bilaterally, normal work of breathing Chest- ICD pocket is well healed Heart- Regular rate and rhythm, no murmurs, rubs or gallops, PMI not laterally displaced GI- soft, NT, ND, + BS Extremities- no clubbing, cyanosis, or edema  ICD interrogation- reviewed in detail today,  See PACEART  report  ekg tracing ordered today is personally reviewed and shows sinus rhythm with demand atrial pacing, BiV paced  Assessment and Plan:  1.  Chronic systolic dysfunction euvolemic today Stable on an appropriate medical regimen Normal ICD function See Pace Art report No changes today Followed in ICM device clinic but only every 91 days per patient preference BiV paced 96%  2. HTN Stable No change required today  Thompson Grayer MD, [facility] 03/30/2017 9:29 AM

## 2017-04-20 ENCOUNTER — Telehealth: Payer: Self-pay | Admitting: Cardiology

## 2017-04-20 ENCOUNTER — Ambulatory Visit (INDEPENDENT_AMBULATORY_CARE_PROVIDER_SITE_OTHER): Payer: BLUE CROSS/BLUE SHIELD | Admitting: *Deleted

## 2017-04-20 DIAGNOSIS — I428 Other cardiomyopathies: Secondary | ICD-10-CM

## 2017-04-20 DIAGNOSIS — I5022 Chronic systolic (congestive) heart failure: Secondary | ICD-10-CM

## 2017-04-20 NOTE — Telephone Encounter (Signed)
LMOVM reminding pt to send remote transmission.   

## 2017-04-21 NOTE — Progress Notes (Signed)
Remote ICD transmission.   

## 2017-04-22 ENCOUNTER — Encounter: Payer: Self-pay | Admitting: Cardiology

## 2017-04-23 LAB — CUP PACEART REMOTE DEVICE CHECK
Battery Voltage: 2.86 V
Brady Statistic AP VS Percent: 1 %
Brady Statistic AS VP Percent: 89 %
HighPow Impedance: 88 Ohm
HighPow Impedance: 88 Ohm
Implantable Lead Implant Date: 20130726
Implantable Lead Implant Date: 20130726
Implantable Lead Location: 753859
Implantable Lead Location: 753860
Implantable Pulse Generator Implant Date: 20130726
Lead Channel Impedance Value: 1275 Ohm
Lead Channel Impedance Value: 510 Ohm
Lead Channel Pacing Threshold Amplitude: 0.625 V
Lead Channel Pacing Threshold Pulse Width: 0.5 ms
Lead Channel Sensing Intrinsic Amplitude: 2.3 mV
Lead Channel Sensing Intrinsic Amplitude: 5.6 mV
Lead Channel Setting Pacing Amplitude: 1.625
Lead Channel Setting Pacing Amplitude: 2.375
Lead Channel Setting Pacing Amplitude: 2.875
Lead Channel Setting Pacing Pulse Width: 0.5 ms
Lead Channel Setting Pacing Pulse Width: 0.5 ms
MDC IDC LEAD IMPLANT DT: 20130726
MDC IDC LEAD LOCATION: 753858
MDC IDC MSMT BATTERY REMAINING LONGEVITY: 23 mo
MDC IDC MSMT BATTERY REMAINING PERCENTAGE: 32 %
MDC IDC MSMT LEADCHNL LV PACING THRESHOLD AMPLITUDE: 1.375 V
MDC IDC MSMT LEADCHNL LV PACING THRESHOLD PULSEWIDTH: 0.5 ms
MDC IDC MSMT LEADCHNL RV IMPEDANCE VALUE: 390 Ohm
MDC IDC MSMT LEADCHNL RV PACING THRESHOLD AMPLITUDE: 1.875 V
MDC IDC MSMT LEADCHNL RV PACING THRESHOLD PULSEWIDTH: 0.5 ms
MDC IDC SESS DTM: 20181002135604
MDC IDC SET LEADCHNL RV SENSING SENSITIVITY: 0.5 mV
MDC IDC STAT BRADY AP VP PERCENT: 6 %
MDC IDC STAT BRADY AS VS PERCENT: 1.8 %
MDC IDC STAT BRADY RA PERCENT PACED: 2.9 %
Pulse Gen Serial Number: 7021876

## 2017-04-24 ENCOUNTER — Telehealth: Payer: Self-pay | Admitting: Internal Medicine

## 2017-04-24 ENCOUNTER — Other Ambulatory Visit (HOSPITAL_COMMUNITY): Payer: Self-pay | Admitting: Internal Medicine

## 2017-04-24 NOTE — Telephone Encounter (Signed)
F/u Message  Pt would like t speak with someone in the device clinic. Please call back to discuss

## 2017-04-27 ENCOUNTER — Telehealth: Payer: Self-pay | Admitting: Cardiology

## 2017-04-27 NOTE — Telephone Encounter (Signed)
Patient called w/ some billing concerns and monitoring concerns. Patient stated that billing is not done correctly. I gave pt billing managers name and phone number to call with these concerns. I informed pt that his home monitor is currently working and up to date like it is suppose to be. Patient stated that he is thinking about separating from [facility] all together. I informed pt that one way we could avoid these complications is offering him an appt in the office every three months. Pt stated "that is a slap to the face by telling him the only way to have his device checked is by coming all the way to [facility]". He stated that he is going to write a letter to EP director and to [facility] of [facility] about these issues. He is also going to Manufacturing engineer with billing concerns as I can not help him with this. I did assure patient that I was the one to charge all accounts for remote monitoring and I could assure him I do the same thing for each and every patient.

## 2017-04-27 NOTE — Telephone Encounter (Signed)
Refer to phone note from 04-27-17.

## 2017-07-03 ENCOUNTER — Other Ambulatory Visit (HOSPITAL_COMMUNITY): Payer: Self-pay | Admitting: Internal Medicine

## 2017-07-20 ENCOUNTER — Ambulatory Visit (INDEPENDENT_AMBULATORY_CARE_PROVIDER_SITE_OTHER): Payer: BLUE CROSS/BLUE SHIELD | Admitting: *Deleted

## 2017-07-20 DIAGNOSIS — I428 Other cardiomyopathies: Secondary | ICD-10-CM | POA: Diagnosis not present

## 2017-07-20 DIAGNOSIS — I5022 Chronic systolic (congestive) heart failure: Secondary | ICD-10-CM

## 2017-07-22 NOTE — Progress Notes (Signed)
Remote ICD transmission.   

## 2017-07-23 LAB — CUP PACEART REMOTE DEVICE CHECK
Battery Remaining Longevity: 20 mo
Battery Voltage: 2.84 V
Brady Statistic AP VP Percent: 5.7 %
Brady Statistic AP VS Percent: 1 %
Brady Statistic AS VP Percent: 90 %
Brady Statistic RA Percent Paced: 3 %
Date Time Interrogation Session: 20190101041216
HighPow Impedance: 87 Ohm
HighPow Impedance: 87 Ohm
Implantable Lead Implant Date: 20130726
Implantable Lead Location: 753858
Implantable Lead Location: 753859
Lead Channel Impedance Value: 530 Ohm
Lead Channel Pacing Threshold Amplitude: 1.25 V
Lead Channel Pacing Threshold Amplitude: 1.75 V
Lead Channel Pacing Threshold Pulse Width: 0.5 ms
Lead Channel Pacing Threshold Pulse Width: 0.5 ms
Lead Channel Sensing Intrinsic Amplitude: 1.1 mV
Lead Channel Setting Pacing Amplitude: 2.75 V
Lead Channel Setting Pacing Pulse Width: 0.5 ms
Lead Channel Setting Pacing Pulse Width: 0.5 ms
Lead Channel Setting Sensing Sensitivity: 0.5 mV
MDC IDC LEAD IMPLANT DT: 20130726
MDC IDC LEAD IMPLANT DT: 20130726
MDC IDC LEAD LOCATION: 753860
MDC IDC MSMT BATTERY REMAINING PERCENTAGE: 29 %
MDC IDC MSMT LEADCHNL LV IMPEDANCE VALUE: 1200 Ohm
MDC IDC MSMT LEADCHNL RA PACING THRESHOLD AMPLITUDE: 0.5 V
MDC IDC MSMT LEADCHNL RA PACING THRESHOLD PULSEWIDTH: 0.5 ms
MDC IDC MSMT LEADCHNL RV IMPEDANCE VALUE: 390 Ohm
MDC IDC MSMT LEADCHNL RV SENSING INTR AMPL: 12 mV
MDC IDC PG IMPLANT DT: 20130726
MDC IDC SET LEADCHNL LV PACING AMPLITUDE: 2.25 V
MDC IDC SET LEADCHNL RA PACING AMPLITUDE: 1.5 V
MDC IDC STAT BRADY AS VS PERCENT: 1.5 %
Pulse Gen Serial Number: 7021876

## 2017-07-24 ENCOUNTER — Encounter: Payer: Self-pay | Admitting: Cardiology

## 2017-07-25 ENCOUNTER — Encounter: Payer: Self-pay | Admitting: Internal Medicine

## 2017-08-10 ENCOUNTER — Other Ambulatory Visit (HOSPITAL_COMMUNITY): Payer: Self-pay | Admitting: *Deleted

## 2017-08-10 MED ORDER — LOSARTAN POTASSIUM 100 MG PO TABS: ORAL_TABLET | ORAL | 3 refills | Status: DC

## 2017-09-08 ENCOUNTER — Other Ambulatory Visit (HOSPITAL_COMMUNITY): Payer: Self-pay | Admitting: *Deleted

## 2017-09-08 MED ORDER — SPIRONOLACTONE 25 MG PO TABS: 25.0000 mg | ORAL_TABLET | Freq: Every day | ORAL | 3 refills | Status: DC

## 2017-09-18 ENCOUNTER — Other Ambulatory Visit (HOSPITAL_COMMUNITY): Payer: Self-pay | Admitting: Internal Medicine

## 2017-10-19 ENCOUNTER — Ambulatory Visit (INDEPENDENT_AMBULATORY_CARE_PROVIDER_SITE_OTHER): Payer: BLUE CROSS/BLUE SHIELD | Admitting: *Deleted

## 2017-10-19 DIAGNOSIS — I428 Other cardiomyopathies: Secondary | ICD-10-CM | POA: Diagnosis not present

## 2017-10-19 DIAGNOSIS — I5022 Chronic systolic (congestive) heart failure: Secondary | ICD-10-CM

## 2017-10-19 NOTE — Progress Notes (Signed)
Remote ICD transmission.   

## 2017-10-20 ENCOUNTER — Encounter: Payer: Self-pay | Admitting: Cardiology

## 2017-10-25 ENCOUNTER — Encounter: Payer: Self-pay | Admitting: Internal Medicine

## 2017-10-26 LAB — CUP PACEART REMOTE DEVICE CHECK
Battery Remaining Longevity: 20 mo
Battery Remaining Percentage: 24 %
Battery Voltage: 2.8 V
Brady Statistic AS VS Percent: 1.4 %
Date Time Interrogation Session: 20190401080018
HIGH POWER IMPEDANCE MEASURED VALUE: 95 Ohm
HighPow Impedance: 95 Ohm
Implantable Lead Implant Date: 20130726
Implantable Lead Implant Date: 20130726
Implantable Lead Implant Date: 20130726
Implantable Lead Location: 753858
Implantable Lead Location: 753860
Implantable Pulse Generator Implant Date: 20130726
Lead Channel Impedance Value: 1200 Ohm
Lead Channel Impedance Value: 400 Ohm
Lead Channel Pacing Threshold Amplitude: 0.625 V
Lead Channel Pacing Threshold Pulse Width: 0.5 ms
Lead Channel Sensing Intrinsic Amplitude: 12 mV
Lead Channel Sensing Intrinsic Amplitude: 2 mV
Lead Channel Setting Pacing Amplitude: 1.625
Lead Channel Setting Pacing Pulse Width: 0.5 ms
MDC IDC LEAD LOCATION: 753859
MDC IDC MSMT LEADCHNL LV PACING THRESHOLD AMPLITUDE: 1.5 V
MDC IDC MSMT LEADCHNL LV PACING THRESHOLD PULSEWIDTH: 0.5 ms
MDC IDC MSMT LEADCHNL RA IMPEDANCE VALUE: 530 Ohm
MDC IDC MSMT LEADCHNL RV PACING THRESHOLD AMPLITUDE: 1.75 V
MDC IDC MSMT LEADCHNL RV PACING THRESHOLD PULSEWIDTH: 0.5 ms
MDC IDC PG SERIAL: 7021876
MDC IDC SET LEADCHNL LV PACING AMPLITUDE: 2.5 V
MDC IDC SET LEADCHNL RV PACING AMPLITUDE: 2.75 V
MDC IDC SET LEADCHNL RV PACING PULSEWIDTH: 0.5 ms
MDC IDC SET LEADCHNL RV SENSING SENSITIVITY: 0.5 mV
MDC IDC STAT BRADY AP VP PERCENT: 5.4 %
MDC IDC STAT BRADY AP VS PERCENT: 1 %
MDC IDC STAT BRADY AS VP PERCENT: 91 %
MDC IDC STAT BRADY RA PERCENT PACED: 3.1 %

## 2017-10-26 NOTE — Telephone Encounter (Signed)
Spoke with patient who stated that he was concerned about his 4/1 transmission since he had recently had pneumonia. I explained that his remote had been processed and showed no episodes. I explained that he had some fluid accumulation that has since resolved. He verbalized understanding and was appreciative of the call. Patient stated he has had significant issues with billing. I apologized for the frustration he was experiencing.

## 2017-11-02 ENCOUNTER — Other Ambulatory Visit: Payer: Self-pay

## 2017-11-02 ENCOUNTER — Encounter (HOSPITAL_COMMUNITY): Payer: Self-pay | Admitting: Internal Medicine

## 2017-11-02 ENCOUNTER — Ambulatory Visit (HOSPITAL_COMMUNITY)
Admission: RE | Admit: 2017-11-02 | Discharge: 2017-11-02 | Disposition: A | Discharge status: Home / Self Care | Payer: BLUE CROSS/BLUE SHIELD | Source: Ambulatory Visit | Attending: Internal Medicine | Admitting: Internal Medicine

## 2017-11-02 VITALS — BP 126/76 | HR 73 | Wt 204.5 lb

## 2017-11-02 DIAGNOSIS — G4700 Insomnia, unspecified: Secondary | ICD-10-CM | POA: Insufficient documentation

## 2017-11-02 DIAGNOSIS — I10 Essential (primary) hypertension: Secondary | ICD-10-CM

## 2017-11-02 DIAGNOSIS — E669 Obesity, unspecified: Secondary | ICD-10-CM | POA: Insufficient documentation

## 2017-11-02 DIAGNOSIS — I110 Hypertensive heart disease with heart failure: Secondary | ICD-10-CM | POA: Diagnosis present

## 2017-11-02 DIAGNOSIS — I5022 Chronic systolic (congestive) heart failure: Secondary | ICD-10-CM | POA: Diagnosis not present

## 2017-11-02 DIAGNOSIS — Z7982 Long term (current) use of aspirin: Secondary | ICD-10-CM | POA: Diagnosis not present

## 2017-11-02 DIAGNOSIS — Z79899 Other long term (current) drug therapy: Secondary | ICD-10-CM | POA: Insufficient documentation

## 2017-11-02 DIAGNOSIS — E785 Hyperlipidemia, unspecified: Secondary | ICD-10-CM | POA: Diagnosis not present

## 2017-11-02 DIAGNOSIS — I420 Dilated cardiomyopathy: Secondary | ICD-10-CM | POA: Insufficient documentation

## 2017-11-02 DIAGNOSIS — I2510 Atherosclerotic heart disease of native coronary artery without angina pectoris: Secondary | ICD-10-CM | POA: Insufficient documentation

## 2017-11-02 DIAGNOSIS — Z95810 Presence of automatic (implantable) cardiac defibrillator: Secondary | ICD-10-CM | POA: Insufficient documentation

## 2017-11-02 NOTE — Patient Instructions (Signed)
Your physician recommends that you schedule a follow-up appointment in: 12 months with Dr. Haroldine Laws  Please Call an Schedule Appointment (call [phone])

## 2017-11-02 NOTE — Progress Notes (Signed)
ADVANCED HF CLINIC NOTE  Patient ID: Louis Hurta., male   DOB: May 15, 1941, 77 y.o.   MRN: [id number]  PCP:  Dr. Serita Grammes Primary Cardiologist:  Dr. Jenkins Rouge (will not go back and see) Primary Electrophysiologist:  Dr. Rayann Heman    History of Present Illness: Louis Juarez. is a 77 y.o. male with h/o HTN, obesity, previous tobacco use (mild restriction on PFTs, no obstruction) and LBBB.  S/p St Jude CRT-D (7/13 followed by Dr. Rayann Heman)   He developed dyspnea/wheezing in summer of 2012. Referred to Dr. Gwenette Greet. Mild airway disease. Echocardiogram 06/30/11: EF 10-15%, inferior, septal, apical akinesis, mild LVH, severe LAE, mild RAE, moderately reduced RVSF, PASP 52.  Cath by Dr. Burt Knack. Mild non-obs CAD. LM 20-30%, LAD ok, LCx 30-40% RCA 30-40% prox 50% distal. LVEF 10-15%.  CPX test:  4/13: Spirometry: normal Peak VO2: 17.7 ml/kg/min predicted peak VO2: 86.5% (corrected to ideal weight VO2 68ml/kg/min) VE/VCO2 slope: 31.1 OUES: 2.11 Peak RER: 1.20 Ventilatory Threshold: 12.0 % predicted peak VO2: 58.7% VE/MVV: 64.9% PETCO2 at peak: 35 O2pulse: 15 % predicted O2pulse: 100%  Echo 7/13: EF 15%  Echo 12/13: 25-30% Echo 03/2013: EF 40-45% Echo 09/17/16 EF 45-50%   Follow up for Heart Failure:  In March was [facility] to [facility] with PNA. Feeling much better now. Denies any dyspnea. No CP or SOB. Just got back on TM and walking ever day. Doing 5 miles per day on his FitBit. Has not had any edema. Weight stable. Weight down to 200 pounds    Labs (10/14): K 4.7, creatinine 0.96  Past Medical History:  Diagnosis Date  . 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 CHF (congestive heart failure) ([place])   . Chronic systolic heart failure ([place])   . DCM (dilated cardiomyopathy) ([place])   . Erectile dysfunction   . HTN (hypertension)   . Hyperlipidemia   . 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 Insomnia     Current Outpatient Medications    Medication Sig Dispense Refill  . amitriptyline (ELAVIL) 100 MG tablet Take 100 mg by mouth at bedtime.      Marland Kitchen aspirin 81 MG tablet Take 81 mg by mouth daily.      . carvedilol (COREG) 25 MG tablet TAKE 1 TABLET (25 MG TOTAL) BY MOUTH 2 (TWO) TIMES DAILY WITH A MEAL. 180 tablet 3  . Colesevelam HCl (WELCHOL PO) Take by mouth every morning. Take as directed    . Docusate Calcium (STOOL SOFTENER PO) Take 250 mg by mouth 2 (two) times daily.    Marland Kitchen ezetimibe (ZETIA) 10 MG tablet Take 10 mg by mouth daily.    . furosemide (LASIX) 20 MG tablet TAKE 1 TABLET (20 MG TOTAL) BY MOUTH 2 (TWO) TIMES DAILY. 180 tablet 2  . LORazepam (ATIVAN) 1 MG tablet Take 1 mg by mouth 2 (two) times daily.    Marland Kitchen losartan (COZAAR) 100 MG tablet Take 1 Tablet in the AM and 1/2 Tablet in the PM 135 tablet 3  . Multiple Vitamins-Minerals (MULTIVITAMIN WITH MINERALS) tablet Take 1 tablet by mouth daily.      . pravastatin (PRAVACHOL) 80 MG tablet Take 80 mg by mouth daily.    Marland Kitchen spironolactone (ALDACTONE) 25 MG tablet Take 1 tablet (25 mg total) by mouth daily. 90 tablet 3  . zolpidem (AMBIEN) 10 MG tablet Take 1 tablet by mouth at bedtime as needed for sleep.  No current facility-administered medications for this encounter.     Allergies: No Known Allergies  ROS:  Please see the history of present illness.   All other systems reviewed and negative.   Vitals:   11/02/17 0929  BP: 126/76  Pulse: 73  SpO2: 100%  Weight: 204 lb 8 oz (92.8 kg)   PHYSICAL EXAM: General:  Well appearing. No resp difficulty HEENT: normal Neck: supple. JVP 7. Carotids 2+ bilat; no bruits. No lymphadenopathy or thryomegaly appreciated. Cor: PMI nondisplaced. Regular rate & rhythm. No rubs, gallops or murmurs. Lungs: clear Abdomen: soft, nontender, nondistended. No hepatosplenomegaly. No bruits or masses. Good bowel sounds. Extremities: no cyanosis, clubbing, rash, edema Neuro: alert & orientedx3, cranial nerves grossly intact. moves  all 4 extremities w/o difficulty. Affect pleasant   ASSESSMENT AND PLAN:   1. Chronic systolic HF: NICM, EF 67-12% (03/2013) Echo 09/17/16 EF 45-50%  s/p St Jude Biv ICD..  - Doing well s/p recent treatment for PNA. - NYHA I. Volume status looks good.  - ICD reviewed personally. No VT. Volume up at time of PNA. Now ok  - Continue coreg 25 mg BID, losartan 150 mg daily and spironolactone 25 mg daily. - Reinforced the need and importance of daily weights, a low sodium diet, and fluid restriction (less than 2 L a day). Instructed to call the HF clinic if weight increases more than 3 lbs overnight or 5 lbs in a week.  2. Nonobstructive CAD:  - no s/s ischemia  Continue ASA, statin and BB.  - Cath 2012: Mild non-obs CAD. LM 20-30%, LAD ok, LCx 30-40% RCA 30-40% prox 50% distal. LVEF 10-15%. 3. HTN:  - Blood pressure well controlled. Continue current regimen. 4. Lipids - Managed by PCP. Goal LDL < 70   Daniel Bensimhon,MD 9:46 AM

## 2017-11-02 NOTE — Addendum Note (Signed)
Encounter addended by: Shirley Muscat, RN on: 11/02/2017 9:59 AM  Actions taken: Sign clinical note

## 2017-11-24 ENCOUNTER — Other Ambulatory Visit: Payer: Self-pay | Admitting: Internal Medicine

## 2017-11-30 ENCOUNTER — Other Ambulatory Visit (HOSPITAL_COMMUNITY): Payer: Self-pay | Admitting: Internal Medicine

## 2018-01-18 ENCOUNTER — Ambulatory Visit (INDEPENDENT_AMBULATORY_CARE_PROVIDER_SITE_OTHER): Payer: BLUE CROSS/BLUE SHIELD | Admitting: *Deleted

## 2018-01-18 DIAGNOSIS — I5022 Chronic systolic (congestive) heart failure: Secondary | ICD-10-CM

## 2018-01-18 DIAGNOSIS — I428 Other cardiomyopathies: Secondary | ICD-10-CM

## 2018-01-19 NOTE — Progress Notes (Signed)
Remote ICD transmission.   

## 2018-02-04 LAB — CUP PACEART REMOTE DEVICE CHECK
Battery Remaining Longevity: 20 mo
Battery Remaining Percentage: 24 %
Battery Voltage: 2.8 V
Brady Statistic AP VP Percent: 4.8 %
Brady Statistic AP VS Percent: 1 %
Brady Statistic AS VP Percent: 92 %
Brady Statistic AS VS Percent: 1.3 %
Brady Statistic RA Percent Paced: 2.7 %
Date Time Interrogation Session: 20190702031607
HighPow Impedance: 84 Ohm
HighPow Impedance: 84 Ohm
Implantable Lead Implant Date: 20130726
Implantable Lead Implant Date: 20130726
Implantable Lead Implant Date: 20130726
Implantable Lead Location: 753858
Implantable Lead Location: 753859
Implantable Lead Location: 753860
Implantable Pulse Generator Implant Date: 20130726
Lead Channel Impedance Value: 1200 Ohm
Lead Channel Impedance Value: 390 Ohm
Lead Channel Impedance Value: 510 Ohm
Lead Channel Pacing Threshold Amplitude: 0.625 V
Lead Channel Pacing Threshold Amplitude: 1.375 V
Lead Channel Pacing Threshold Amplitude: 1.5 V
Lead Channel Pacing Threshold Pulse Width: 0.5 ms
Lead Channel Pacing Threshold Pulse Width: 0.5 ms
Lead Channel Pacing Threshold Pulse Width: 0.5 ms
Lead Channel Sensing Intrinsic Amplitude: 1.3 mV
Lead Channel Sensing Intrinsic Amplitude: 12 mV
Lead Channel Setting Pacing Amplitude: 1.625
Lead Channel Setting Pacing Amplitude: 2.375
Lead Channel Setting Pacing Amplitude: 2.5 V
Lead Channel Setting Pacing Pulse Width: 0.5 ms
Lead Channel Setting Pacing Pulse Width: 0.5 ms
Lead Channel Setting Sensing Sensitivity: 0.5 mV
Pulse Gen Serial Number: 7021876

## 2018-03-28 NOTE — Progress Notes (Signed)
Electrophysiology Office Note Date: 03/31/2018  ID:  Louis Christians., DOB 08-17-1940, MRN [id number]  PCP: Curly Rim, MD Primary Cardiologist: [place] Electrophysiologist: [place]  CC: Routine ICD follow-up  Louis Saye. is a 77 y.o. male seen today for Dr Louis Juarez.  He presents today for routine electrophysiology followup.  Since last being seen in our clinic, the patient reports doing very well. He denies chest pain, palpitations, dyspnea, PND, orthopnea, nausea, vomiting, dizziness, syncope, edema, weight gain, or early satiety.  He has not had ICD shocks.   Device History: STJ CRTD implanted 2013 for ICM,CHF History of appropriate therapy: no History of AAD therapy: no   Past Medical History:  Diagnosis Date  . 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 CHF (congestive heart failure) ([place])   . Chronic systolic heart failure ([place])   . DCM (dilated cardiomyopathy) ([place])   . Erectile dysfunction   . HTN (hypertension)   . Hyperlipidemia   . 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 Insomnia    Past Surgical History:  Procedure Laterality Date  . BI-VENTRICULAR IMPLANTABLE CARDIOVERTER DEFIBRILLATOR N/A 02/13/2012   Procedure: BI-VENTRICULAR IMPLANTABLE CARDIOVERTER DEFIBRILLATOR  (CRT-D);  Surgeon: Thompson Grayer, MD;  Location: [facility] CATH LAB;  Service: Cardiovascular;  Laterality: N/A;  . CARDIAC DEFIBRILLATOR PLACEMENT  02/13/2012   SJM Quadra Assura BIV ICD implanted by Dr Louis Juarez  . NASAL SINUS SURGERY  93, 97, 2010, 2006    Current Outpatient Medications  Medication Sig Dispense Refill  . amitriptyline (ELAVIL) 100 MG tablet Take 100 mg by mouth at bedtime.      Marland Kitchen aspirin 81 MG tablet Take 81 mg by mouth daily.      . carvedilol (COREG) 25 MG tablet TAKE 1 TABLET (25 MG TOTAL) BY MOUTH 2 (TWO) TIMES DAILY WITH A MEAL. 180 tablet 3  . Colesevelam HCl (WELCHOL PO) Take by mouth every morning. Take as directed    . Docusate Calcium (STOOL  SOFTENER PO) Take 250 mg by mouth 2 (two) times daily.    Marland Kitchen ezetimibe (ZETIA) 10 MG tablet Take 10 mg by mouth daily.    . furosemide (LASIX) 20 MG tablet TAKE 1 TABLET (20 MG TOTAL) BY MOUTH 2 (TWO) TIMES DAILY. 180 tablet 2  . LORazepam (ATIVAN) 1 MG tablet Take 1 mg by mouth 2 (two) times daily.    Marland Kitchen losartan (COZAAR) 100 MG tablet Take 1 Tablet in the AM and 1/2 Tablet in the PM 135 tablet 3  . Multiple Vitamins-Minerals (MULTIVITAMIN WITH MINERALS) tablet Take 1 tablet by mouth daily.      . pravastatin (PRAVACHOL) 80 MG tablet Take 80 mg by mouth daily.    Marland Kitchen spironolactone (ALDACTONE) 25 MG tablet Take 1 tablet (25 mg total) by mouth daily. 90 tablet 3  . zolpidem (AMBIEN) 10 MG tablet Take 1 tablet by mouth at bedtime as needed for sleep.      No current facility-administered medications for this visit.     Allergies:   Patient has no known allergies.   Social History: Social History   Socioeconomic History  . Marital status: Married    Spouse name: Not on file  . Number of children: Not on file  . Years of education: Not on file  . Highest education level: Not on file  Occupational History  . Occupation: retired  Scientific laboratory technician  . Financial resource strain: Not on file  . Food insecurity:  Worry: Not on file    Inability: Not on file  . Transportation needs:    Medical: Not on file    Non-medical: Not on file  Tobacco Use  . Smoking status: Former Smoker    Packs/day: 1.50    Years: 20.00    Pack years: 30.00    Types: Cigarettes    Last attempt to quit: 07/21/2001    Years since quitting: 16.7  . Smokeless tobacco: Never Used  Substance and Sexual Activity  . Alcohol use: No  . Drug use: No  . Sexual activity: Not Currently  Lifestyle  . Physical activity:    Days per week: Not on file    Minutes per session: Not on file  . Stress: Not on file  Relationships  . Social connections:    Talks on phone: Not on file    Gets together: Not on file    Attends  religious service: Not on file    Active member of club or organization: Not on file    Attends meetings of clubs or organizations: Not on file    Relationship status: Not on file  . Intimate partner violence:    Fear of current or ex partner: Not on file    Emotionally abused: Not on file    Physically abused: Not on file    Forced sexual activity: Not on file  Other Topics Concern  . Not on file  Social History Narrative   Lives in [place].  Retired from the [employer] x 6 years.  He is originally from [place] .  Married.  1 grown son.      Family History: Family History  Problem Relation Age of Onset  . Colon cancer Father   . Melanoma Mother   . Melanoma Sister     Review of Systems: All other systems reviewed and are otherwise negative except as noted above.   Physical Exam: VS:  BP 122/74   Pulse 67   Ht 5' 10.5" (1.791 m)   Wt 199 lb 4.8 oz (90.4 kg)   SpO2 96%   BMI 28.19 kg/m  , BMI Body mass index is 28.19 kg/m.  GEN- The patient is well appearing, alert and oriented x 3 today.   HEENT: normocephalic, atraumatic; sclera clear, conjunctiva pink; hearing intact; oropharynx clear; neck supple  Lungs- Clear to ausculation bilaterally, normal work of breathing.  No wheezes, rales, rhonchi Heart- Regular rate and rhythm (paced) GI- soft, non-tender, non-distended, bowel sounds present  Extremities- no clubbing, cyanosis, or edema  MS- no significant deformity or atrophy Skin- warm and dry, no rash or lesion; ICD pocket well healed Psych- euthymic mood, full affect Neuro- strength and sensation are intact  ICD interrogation- reviewed in detail today,  See PACEART report  EKG:  EKG is not ordered today.  Recent Labs: No results found for requested labs within last 8760 hours.   Wt Readings from Last 3 Encounters:  03/31/18 199 lb 4.8 oz (90.4 kg)  11/02/17 204 lb 8 oz (92.8 kg)  03/30/17 217 lb (98.4 kg)     Other studies  Reviewed: Additional studies/ records that were reviewed today include: Dr Louis Juarez and AHF notes  Assessment and Plan:  1.  Chronic systolic dysfunction euvolemic today Stable on an appropriate medical regimen Normal ICD function See Pace Art report No changes today He declines ICM follow up. Labs checked by PCP last month   2.  HTN Stable No change required today  Current medicines are reviewed at length with the patient today.   The patient does not have concerns regarding his medicines.  The following changes were made today:  none  Labs/ tests ordered today include: none Orders Placed This Encounter  Procedures  . CUP PACEART [place]  . EKG 12-Lead     Disposition:   Follow up with Delilah Shan, Dr Louis Juarez 1 year    Signed, Chanetta Marshall, NP 03/31/2018 8:13 AM  [place] [address] [phone] (office) [phone] (fax)

## 2018-03-31 ENCOUNTER — Encounter: Payer: Self-pay | Admitting: Nurse Practitioner

## 2018-03-31 ENCOUNTER — Ambulatory Visit (INDEPENDENT_AMBULATORY_CARE_PROVIDER_SITE_OTHER): Payer: BLUE CROSS/BLUE SHIELD | Admitting: Nurse Practitioner

## 2018-03-31 VITALS — BP 122/74 | HR 67 | Ht 70.5 in | Wt 199.3 lb

## 2018-03-31 DIAGNOSIS — I5022 Chronic systolic (congestive) heart failure: Secondary | ICD-10-CM | POA: Diagnosis not present

## 2018-03-31 DIAGNOSIS — I428 Other cardiomyopathies: Secondary | ICD-10-CM | POA: Diagnosis not present

## 2018-03-31 DIAGNOSIS — I10 Essential (primary) hypertension: Secondary | ICD-10-CM

## 2018-03-31 LAB — CUP PACEART INCLINIC DEVICE CHECK
Date Time Interrogation Session: 20190911080034
Implantable Lead Implant Date: 20130726
Implantable Lead Implant Date: 20130726
Implantable Lead Location: 753858
Implantable Lead Location: 753859
Implantable Pulse Generator Implant Date: 20130726
MDC IDC LEAD IMPLANT DT: 20130726
MDC IDC LEAD LOCATION: 753860
Pulse Gen Serial Number: 7021876

## 2018-03-31 NOTE — Patient Instructions (Addendum)
Medication Instructions:   Your physician recommends that you continue on your current medications as directed. Please refer to the Current Medication list given to you today.   If you need a refill on your cardiac medications before your next appointment, please call your pharmacy.  Labwork: NONE ORDERED  TODAY    Testing/Procedures: NONE ORDERED  TODAY    Follow-Up: Your physician wants you to follow-up in: [place] will receive a reminder letter in the mail two months in advance. If you don't receive a letter, please call our office to schedule the follow-up appointment.   Remote monitoring is used to monitor your Pacemaker of ICD from home. This monitoring reduces the number of office visits required to check your device to one time per year. It allows [place] to keep an eye on the functioning of your device to ensure it is working properly. You are scheduled for a device check from home on . 04-19-18 You may send your transmission at any time that day. If you have a wireless device, the transmission will be sent automatically. After your physician reviews your transmission, you will receive a postcard with your next transmission date.     Any Other Special Instructions Will Be Listed Below (If Applicable).

## 2018-04-19 ENCOUNTER — Ambulatory Visit (INDEPENDENT_AMBULATORY_CARE_PROVIDER_SITE_OTHER): Payer: BLUE CROSS/BLUE SHIELD | Admitting: *Deleted

## 2018-04-19 DIAGNOSIS — I428 Other cardiomyopathies: Secondary | ICD-10-CM

## 2018-04-19 DIAGNOSIS — I5022 Chronic systolic (congestive) heart failure: Secondary | ICD-10-CM | POA: Diagnosis not present

## 2018-04-19 NOTE — Progress Notes (Signed)
Remote ICD transmission.   

## 2018-05-13 LAB — CUP PACEART REMOTE DEVICE CHECK
Battery Remaining Longevity: 13 mo
Brady Statistic AS VS Percent: 1 %
Date Time Interrogation Session: 20190930080018
HIGH POWER IMPEDANCE MEASURED VALUE: 89 Ohm
HIGH POWER IMPEDANCE MEASURED VALUE: 89 Ohm
Implantable Lead Implant Date: 20130726
Implantable Lead Implant Date: 20130726
Implantable Lead Location: 753858
Implantable Lead Location: 753859
Implantable Pulse Generator Implant Date: 20130726
Lead Channel Pacing Threshold Amplitude: 1.75 V
Lead Channel Pacing Threshold Pulse Width: 0.5 ms
Lead Channel Pacing Threshold Pulse Width: 0.5 ms
Lead Channel Pacing Threshold Pulse Width: 0.5 ms
Lead Channel Sensing Intrinsic Amplitude: 1.5 mV
Lead Channel Setting Pacing Amplitude: 1.625
Lead Channel Setting Pacing Amplitude: 2.75 V
Lead Channel Setting Pacing Amplitude: 2.75 V
Lead Channel Setting Pacing Pulse Width: 0.5 ms
Lead Channel Setting Sensing Sensitivity: 0.5 mV
MDC IDC LEAD IMPLANT DT: 20130726
MDC IDC LEAD LOCATION: 753860
MDC IDC MSMT BATTERY REMAINING PERCENTAGE: 18 %
MDC IDC MSMT BATTERY VOLTAGE: 2.75 V
MDC IDC MSMT LEADCHNL LV IMPEDANCE VALUE: 1200 Ohm
MDC IDC MSMT LEADCHNL RA IMPEDANCE VALUE: 480 Ohm
MDC IDC MSMT LEADCHNL RA PACING THRESHOLD AMPLITUDE: 0.625 V
MDC IDC MSMT LEADCHNL RV IMPEDANCE VALUE: 360 Ohm
MDC IDC MSMT LEADCHNL RV PACING THRESHOLD AMPLITUDE: 1.75 V
MDC IDC MSMT LEADCHNL RV SENSING INTR AMPL: 12 mV
MDC IDC SET LEADCHNL LV PACING PULSEWIDTH: 0.5 ms
MDC IDC STAT BRADY AP VP PERCENT: 2.4 %
MDC IDC STAT BRADY AP VS PERCENT: 1 %
MDC IDC STAT BRADY AS VP PERCENT: 95 %
MDC IDC STAT BRADY RA PERCENT PACED: 1.2 %
Pulse Gen Serial Number: 7021876

## 2018-06-15 ENCOUNTER — Telehealth (HOSPITAL_COMMUNITY): Payer: Self-pay | Admitting: Surgery

## 2018-06-15 NOTE — Telephone Encounter (Signed)
I called Louis Juarez to find out if he has had teeth extraction yet and/or if he still needs surgical clearance.  I left a message requesting call back.

## 2018-07-02 ENCOUNTER — Other Ambulatory Visit (HOSPITAL_COMMUNITY): Payer: Self-pay | Admitting: Cardiology

## 2018-07-19 ENCOUNTER — Ambulatory Visit (INDEPENDENT_AMBULATORY_CARE_PROVIDER_SITE_OTHER): Payer: BLUE CROSS/BLUE SHIELD

## 2018-07-19 DIAGNOSIS — I428 Other cardiomyopathies: Secondary | ICD-10-CM

## 2018-07-19 DIAGNOSIS — I5022 Chronic systolic (congestive) heart failure: Secondary | ICD-10-CM | POA: Diagnosis not present

## 2018-07-19 NOTE — Progress Notes (Signed)
Remote ICD transmission.   

## 2018-07-20 LAB — CUP PACEART REMOTE DEVICE CHECK
Battery Remaining Longevity: 12 mo
Battery Remaining Percentage: 13 %
Battery Voltage: 2.71 V
Brady Statistic AP VP Percent: 4 %
Brady Statistic AS VS Percent: 1.3 %
Brady Statistic RA Percent Paced: 1.9 %
Date Time Interrogation Session: 20191230090019
HIGH POWER IMPEDANCE MEASURED VALUE: 83 Ohm
HighPow Impedance: 83 Ohm
Implantable Lead Implant Date: 20130726
Implantable Lead Implant Date: 20130726
Implantable Lead Location: 753859
Implantable Lead Location: 753860
Lead Channel Impedance Value: 1300 Ohm
Lead Channel Impedance Value: 530 Ohm
Lead Channel Pacing Threshold Amplitude: 0.5 V
Lead Channel Pacing Threshold Amplitude: 1.5 V
Lead Channel Pacing Threshold Amplitude: 1.625 V
Lead Channel Pacing Threshold Pulse Width: 0.5 ms
Lead Channel Pacing Threshold Pulse Width: 0.5 ms
Lead Channel Pacing Threshold Pulse Width: 0.5 ms
Lead Channel Sensing Intrinsic Amplitude: 12 mV
Lead Channel Setting Pacing Amplitude: 2.5 V
Lead Channel Setting Pacing Amplitude: 2.625
Lead Channel Setting Pacing Pulse Width: 0.5 ms
Lead Channel Setting Pacing Pulse Width: 0.5 ms
Lead Channel Setting Sensing Sensitivity: 0.5 mV
MDC IDC LEAD IMPLANT DT: 20130726
MDC IDC LEAD LOCATION: 753858
MDC IDC MSMT LEADCHNL RA SENSING INTR AMPL: 1.7 mV
MDC IDC MSMT LEADCHNL RV IMPEDANCE VALUE: 340 Ohm
MDC IDC PG IMPLANT DT: 20130726
MDC IDC SET LEADCHNL RA PACING AMPLITUDE: 1.5 V
MDC IDC STAT BRADY AP VS PERCENT: 1 %
MDC IDC STAT BRADY AS VP PERCENT: 92 %
Pulse Gen Serial Number: 7021876

## 2018-08-02 ENCOUNTER — Other Ambulatory Visit (HOSPITAL_COMMUNITY): Payer: Self-pay

## 2018-08-02 MED ORDER — LOSARTAN POTASSIUM 100 MG PO TABS: ORAL_TABLET | ORAL | 0 refills | Status: DC

## 2018-08-20 ENCOUNTER — Other Ambulatory Visit (HOSPITAL_COMMUNITY): Payer: Self-pay | Admitting: Internal Medicine

## 2018-10-12 ENCOUNTER — Encounter (HOSPITAL_COMMUNITY): Payer: Self-pay | Admitting: *Deleted

## 2018-10-13 ENCOUNTER — Ambulatory Visit (HOSPITAL_COMMUNITY)
Admission: RE | Admit: 2018-10-13 | Discharge: 2018-10-13 | Disposition: A | Discharge status: Home / Self Care | Payer: BLUE CROSS/BLUE SHIELD | Source: Ambulatory Visit | Attending: Internal Medicine | Admitting: Internal Medicine

## 2018-10-13 ENCOUNTER — Other Ambulatory Visit: Payer: Self-pay

## 2018-10-13 DIAGNOSIS — I110 Hypertensive heart disease with heart failure: Secondary | ICD-10-CM

## 2018-10-13 DIAGNOSIS — I2510 Atherosclerotic heart disease of native coronary artery without angina pectoris: Secondary | ICD-10-CM | POA: Diagnosis not present

## 2018-10-13 DIAGNOSIS — I10 Essential (primary) hypertension: Secondary | ICD-10-CM

## 2018-10-13 DIAGNOSIS — I5022 Chronic systolic (congestive) heart failure: Secondary | ICD-10-CM | POA: Diagnosis not present

## 2018-10-13 DIAGNOSIS — Z95810 Presence of automatic (implantable) cardiac defibrillator: Secondary | ICD-10-CM | POA: Diagnosis not present

## 2018-10-13 NOTE — Progress Notes (Addendum)
Heart Failure TeleHealth Note  Due to national recommendations of social distancing due to [place] 19, Audio/video telehealth visit is felt to be most appropriate for this patient at this time.  See MyChart message from today for patient consent regarding telehealth for [facility].  I discussed the limitations, risks, security and privacy concerns of performing an evaluation and management service by telephone and the availability of in person appointments. I also discussed with the patient that there may be a patient responsible charge related to this service. The patient expressed understanding and agreed to proceed.  Date:  10/13/2018   ID:  [name], DOB October 11, 1940, MRN [id number]  Location: Home  Provider location: [address] [place] Type of Visit: Established patient.   PCP:  Corrington, Delsa Grana, MD  Cardiologist:  No primary care provider on file. Primary HF: Dr. Haroldine Laws   Chief Complaint: Yearly follow up.    History of Present Illness: Patrich Heinze. is a 78 y.o. male who presents via audio/video conferencing for a telehealth visit today.     Deavon Podgorski. is a 78 y.o. male with h/o HTN, obesity, previous tobacco use (mild restriction on PFTs, no obstruction) and LBBB.  S/p St Jude CRT-D (7/13 followed by Dr. Rayann Heman)   He presents today for his annual follow up. He has been staying home with the COVID restrictions. He denies any problems with his breathing. His weight stays between 196 - 207 lbs. He exercises daily, at least 1 mile a day on the treadmill, and walks at least 25 miles total weekly. He denies CP or palpitations. Has no SOB with walking at pace or on flat. Mild SOB walking up hills, carrying something heavy, or going up stairs. He denies orthopnea, PND, peripheral edema, presyncope, or syncope.  He had a dental appointment yesterday with only the doctor and 1 employee. He has had no COVID-19 contacts that he knows about or recent  travels.   Walks an average of 3.5 miles per day and resting HR 67 bpm. He is unable to find his BP cuff. He has not needed any additional lasix.   Past Medical History:  Diagnosis Date   Cancer ([place])    basel cell on hand   CHF (congestive heart failure) ([facility])    Chronic systolic heart failure ([facility])    DCM (dilated cardiomyopathy) ([facility])    Erectile dysfunction    HTN (hypertension)    Hyperlipidemia    ICD (implantable cardiac defibrillator) in place 02/13/2012   Insomnia    Past Surgical History:  Procedure Laterality Date   BI-VENTRICULAR IMPLANTABLE CARDIOVERTER DEFIBRILLATOR N/A 02/13/2012   Procedure: BI-VENTRICULAR IMPLANTABLE CARDIOVERTER DEFIBRILLATOR  (CRT-D);  Surgeon: Thompson Grayer, MD;  Location: [facility] CATH LAB;  Service: Cardiovascular;  Laterality: N/A;   CARDIAC DEFIBRILLATOR PLACEMENT  02/13/2012   SJM Quadra Assura BIV ICD implanted by Dr Rayann Heman   NASAL SINUS SURGERY  93, 97, 2010, 2006   Current Outpatient Medications  Medication Sig Dispense Refill   amitriptyline (ELAVIL) 100 MG tablet Take 100 mg by mouth at bedtime.       aspirin 81 MG tablet Take 81 mg by mouth daily.       carvedilol (COREG) 25 MG tablet TAKE 1 TABLET (25 MG TOTAL) BY MOUTH 2 (TWO) TIMES DAILY WITH A MEAL. 180 tablet 1   Colesevelam HCl (WELCHOL PO) Take by mouth every morning. Take as directed     Docusate  Calcium (STOOL SOFTENER PO) Take 250 mg by mouth 2 (two) times daily.     ezetimibe (ZETIA) 10 MG tablet Take 10 mg by mouth daily.     furosemide (LASIX) 20 MG tablet TAKE 1 TABLET (20 MG TOTAL) BY MOUTH 2 (TWO) TIMES DAILY. 180 tablet 1   LORazepam (ATIVAN) 1 MG tablet Take 1 mg by mouth 2 (two) times daily.     losartan (COZAAR) 100 MG tablet Take 1 Tablet in the AM and 1/2 Tablet in the PM 135 tablet 0   Multiple Vitamins-Minerals (MULTIVITAMIN WITH MINERALS) tablet Take 1 tablet by mouth daily.       pravastatin (PRAVACHOL) 80 MG tablet Take 80 mg by mouth daily.       spironolactone (ALDACTONE) 25 MG tablet TAKE 1 TABLET BY MOUTH EVERY DAY 90 tablet 1   zolpidem (AMBIEN) 10 MG tablet Take 1 tablet by mouth at bedtime as needed for sleep.      No current facility-administered medications for this encounter.    Allergies:   Patient has no known allergies.   Social History:  The patient  reports that he quit smoking about 17 years ago. His smoking use included cigarettes. He has a 30.00 pack-year smoking history. He has never used smokeless tobacco. He reports that he does not drink alcohol or use drugs.   Family History:  The patient's family history includes Colon cancer in his father; Melanoma in his mother and sister.  ROS:  Please see the history of present illness.   All other systems are personally reviewed and negative.   Exam:  (Video/Tele Health Call; Exam is subjective based on the information provided by the patient.) Pt is well sounding, with no SOB with conversation. He is alert and oriented and has no difficulties with conversation. No slurred speech and no audible wheeze or cough.   Recent Labs: No results found for requested labs within last 8760 hours.  Personally reviewed Care Everywhere labs as below.   Wt Readings from Last 3 Encounters:  03/31/18 90.4 kg (199 lb 4.8 oz)  11/02/17 92.8 kg (204 lb 8 oz)  03/30/17 98.4 kg (217 lb)    Other studies personally reviewed: Additional studies/ records that were reviewed today include: BMET/CBC from 07/08/2018 in [place].   ASSESSMENT AND PLAN:  1. Chronic systolic HF: NICM, EF 95-63% (03/2013) Echo 09/17/16 EF 45-50%  s/p St Jude Biv ICD..  - Doing well s/p recent treatment for PNA. - NYHA II symptoms by report. He denies any symptoms that lead me to believe his volume status is elevated.  - Have asked patient to send Corevue as schedule 10/18/2018. - Continue coreg 25 mg BID - Continue losartan 150 mg daily - Continue spironolactone 25 mg daily - Reinforced fluid restriction  to < 2 L daily, sodium restriction to less than 2000 mg daily, and the importance of daily weights. 2. Nonobstructive CAD:  - He denies s/s of ischemia.  - Continue ASA, statin and BB.  - Cath 2012: Mild non-obs CAD. LM 20-30%, LAD ok, LCx 30-40% RCA 30-40% prox 50% distal. LVEF 10-15%. 3. HTN:  - By report, blood pressure is well controlled. Continue current regimen. 4. Lipids - These are manage by PCP. Goal LDL < 70. Relatively stable.   COVID screen The patient does not have any symptoms that suggest any further testing/ screening at this time.  Social distancing reinforced today.  Recommended follow-up: Yearly follow up.  Relevant cardiac medications were reviewed at  length with the patient today.   The patient does not have concerns regarding their medications at this time.   The following changes were made today: None. He is stable on current medications and has been for some time. Recent labwork (06/2018 reviewed and stable)  Labs/ tests ordered today include: None  Patient Risk: After full review of this patients clinical status, I feel that they are at mild/moderate risk for cardiac decompensation at this time.  Today, I have spent 15 minutes with the patient with telehealth technology discussing his health.    Jacalyn Lefevre, PA-C  10/13/2018 9:54 AM  [facility] [address] [place] [address] [phone] (office) [phone] (fax)

## 2018-10-14 ENCOUNTER — Encounter (HOSPITAL_COMMUNITY): Payer: BLUE CROSS/BLUE SHIELD | Admitting: Internal Medicine

## 2018-10-18 ENCOUNTER — Other Ambulatory Visit: Payer: Self-pay

## 2018-10-18 ENCOUNTER — Ambulatory Visit (INDEPENDENT_AMBULATORY_CARE_PROVIDER_SITE_OTHER): Payer: BLUE CROSS/BLUE SHIELD | Admitting: *Deleted

## 2018-10-18 DIAGNOSIS — I5022 Chronic systolic (congestive) heart failure: Secondary | ICD-10-CM | POA: Diagnosis not present

## 2018-10-18 DIAGNOSIS — I428 Other cardiomyopathies: Secondary | ICD-10-CM

## 2018-10-18 LAB — CUP PACEART REMOTE DEVICE CHECK
Brady Statistic AP VP Percent: 3.4 %
Brady Statistic AP VS Percent: 1 %
Brady Statistic AS VP Percent: 93 %
Brady Statistic AS VS Percent: 1.2 %
HighPow Impedance: 82 Ohm
HighPow Impedance: 82 Ohm
Implantable Lead Implant Date: 20130726
Lead Channel Impedance Value: 490 Ohm
Lead Channel Pacing Threshold Amplitude: 0.625 V
Lead Channel Sensing Intrinsic Amplitude: 1.8 mV
Lead Channel Setting Pacing Amplitude: 1.625
Lead Channel Setting Pacing Amplitude: 2.75 V
Lead Channel Setting Pacing Pulse Width: 0.5 ms
Lead Channel Setting Pacing Pulse Width: 0.5 ms
MDC IDC LEAD IMPLANT DT: 20130726
MDC IDC LEAD IMPLANT DT: 20130726
MDC IDC LEAD LOCATION: 753858
MDC IDC LEAD LOCATION: 753859
MDC IDC LEAD LOCATION: 753860
MDC IDC MSMT BATTERY REMAINING LONGEVITY: 7 mo
MDC IDC MSMT BATTERY REMAINING PERCENTAGE: 9 %
MDC IDC MSMT BATTERY VOLTAGE: 2.66 V
MDC IDC MSMT LEADCHNL LV IMPEDANCE VALUE: 1025 Ohm
MDC IDC MSMT LEADCHNL LV PACING THRESHOLD AMPLITUDE: 2.75 V
MDC IDC MSMT LEADCHNL LV PACING THRESHOLD PULSEWIDTH: 0.5 ms
MDC IDC MSMT LEADCHNL RA PACING THRESHOLD PULSEWIDTH: 0.5 ms
MDC IDC MSMT LEADCHNL RV IMPEDANCE VALUE: 350 Ohm
MDC IDC MSMT LEADCHNL RV PACING THRESHOLD AMPLITUDE: 1.75 V
MDC IDC MSMT LEADCHNL RV PACING THRESHOLD PULSEWIDTH: 0.5 ms
MDC IDC MSMT LEADCHNL RV SENSING INTR AMPL: 12 mV
MDC IDC PG IMPLANT DT: 20130726
MDC IDC SESS DTM: 20200330080019
MDC IDC SET LEADCHNL LV PACING AMPLITUDE: 3.75 V
MDC IDC SET LEADCHNL RV SENSING SENSITIVITY: 0.5 mV
MDC IDC STAT BRADY RA PERCENT PACED: 1.5 %
Pulse Gen Serial Number: 7021876

## 2018-10-25 NOTE — Progress Notes (Signed)
Remote ICD transmission.   

## 2018-10-31 ENCOUNTER — Other Ambulatory Visit (HOSPITAL_COMMUNITY): Payer: Self-pay | Admitting: Internal Medicine

## 2018-11-11 ENCOUNTER — Telehealth: Payer: Self-pay

## 2018-11-11 NOTE — Telephone Encounter (Signed)
Hey team sounds like we need to discuss this. Anything we can do better?

## 2018-11-11 NOTE — Telephone Encounter (Signed)
Pt called about the letter I sent. Pt states I did not put any results from the transmission in the letter. I explained to the pt that we do not put results in the letter. I also explain that the nurses reviews the transmission and if they see anything they call the pt however if everything looks normal the pt do not receive a phone call. He wanted me to tell him how his transmission look. I explained to him I was a cma and do not read the transmission the nurse reads them and can give the results. I told him anytime he has a scheduled transmission he could always call to get his results. Pt states he had an appointment with Dr. Haroldine Laws but his PA called him instead. Pt told the PA he been experiencing some shortness of breath. The PA was supposed to call him back however had not responded to him yet. The pt is frustrated with [facility] and stated he wants to find someone else to follow his device. He would like for Sharman Cheek to give him a call back.

## 2018-11-12 NOTE — Telephone Encounter (Signed)
Patient expressed his frustration during his enrollment in [facility] clinic from 2016 -2018 which he requested ICM disenrollment and a 2nd enrollment during that time.  He expressed frustrations of receiving mychart notifications each time a device clinic appt was changed. Explained to him this is standard workflow for the device clinic and we work within confines of the office software which cannot be changed. He requested to be disenrolled from [facility] clinic 11/05/2016 due to costs, billing issues and my chart notifications.  During his enrollment, I worked with billing department to ensure the billing was correct and he spoke with billing department as well on numerous occasions about the monthly bills.  Patient was calling ICM clinic 2-3 times a week to discuss billing and mychart messages and did not want to discuss his remote transmission results or health.  Advised patient he could change his mychart notification preferences but he declined.  At this time, I am not sure a 3rd ICM enrollment will benefit him due to his focus appears to be on the process of remote transmissions and ICM clinic focuses on health.  ICM clinic may be another source of frustration since the standard workflow has not changed and unable to change to meet his expectations.

## 2018-11-18 NOTE — Telephone Encounter (Signed)
Patient returning your call.

## 2018-11-18 NOTE — Telephone Encounter (Signed)
LMOM requesting call back if he still has device-related questions, gave direct DC phone number. Will also send a MyChart message.

## 2018-11-18 NOTE — Telephone Encounter (Addendum)
Spoke with patient. Advised of 10/17/28 transmission results. Advised ERI is anticipated in ~6.5 months. Explained our current policy regarding relaying transmission results and advised that he is always welcome to call our office or send a MyChart message for his transmission results in the future. He verbalizes understanding and thanked me for my call and explanation. He denies any other questions or concerns at this time.

## 2018-11-30 ENCOUNTER — Telehealth: Payer: Self-pay | Admitting: *Deleted

## 2018-11-30 NOTE — Telephone Encounter (Signed)
Spoke with patient. He wanted to thank [place] for being accessible and for answering his questions regarding his recent remote transmission. Pt agrees to call after future remotes to discuss any questions. He denies questions at this time and thanked me for my call.

## 2018-11-30 NOTE — Telephone Encounter (Signed)
-----   Message from Tiajuana Amass, [place] sent at 11/24/2018  9:44 AM EDT ----- Regarding: Thank you Mr. Louis Juarez to talk to you. He wanted to tell you Thank you for the report and he wants you to call him back at [phone].  Thanks  Pamala Hurry

## 2018-12-26 ENCOUNTER — Other Ambulatory Visit (HOSPITAL_COMMUNITY): Payer: Self-pay | Admitting: Cardiology

## 2019-01-17 ENCOUNTER — Telehealth: Payer: Self-pay

## 2019-01-17 ENCOUNTER — Ambulatory Visit (INDEPENDENT_AMBULATORY_CARE_PROVIDER_SITE_OTHER): Payer: BC Managed Care – PPO | Admitting: *Deleted

## 2019-01-17 DIAGNOSIS — I5022 Chronic systolic (congestive) heart failure: Secondary | ICD-10-CM

## 2019-01-17 DIAGNOSIS — I428 Other cardiomyopathies: Secondary | ICD-10-CM | POA: Diagnosis not present

## 2019-01-17 LAB — CUP PACEART REMOTE DEVICE CHECK
Battery Remaining Longevity: 6 mo
Battery Remaining Percentage: 6 %
Battery Voltage: 2.63 V
Brady Statistic AP VP Percent: 3.2 %
Brady Statistic AP VS Percent: 1 %
Brady Statistic AS VP Percent: 94 %
Brady Statistic AS VS Percent: 1.1 %
Brady Statistic RA Percent Paced: 1.3 %
Date Time Interrogation Session: 20200629080019
HighPow Impedance: 81 Ohm
HighPow Impedance: 81 Ohm
Implantable Lead Implant Date: 20130726
Implantable Lead Implant Date: 20130726
Implantable Lead Implant Date: 20130726
Implantable Lead Location: 753858
Implantable Lead Location: 753859
Implantable Lead Location: 753860
Implantable Pulse Generator Implant Date: 20130726
Lead Channel Impedance Value: 1125 Ohm
Lead Channel Impedance Value: 360 Ohm
Lead Channel Impedance Value: 490 Ohm
Lead Channel Pacing Threshold Amplitude: 0.625 V
Lead Channel Pacing Threshold Amplitude: 1.5 V
Lead Channel Pacing Threshold Amplitude: 1.625 V
Lead Channel Pacing Threshold Pulse Width: 0.5 ms
Lead Channel Pacing Threshold Pulse Width: 0.5 ms
Lead Channel Pacing Threshold Pulse Width: 0.5 ms
Lead Channel Sensing Intrinsic Amplitude: 12 mV
Lead Channel Sensing Intrinsic Amplitude: 2 mV
Lead Channel Setting Pacing Amplitude: 1.625
Lead Channel Setting Pacing Amplitude: 2.5 V
Lead Channel Setting Pacing Amplitude: 2.625
Lead Channel Setting Pacing Pulse Width: 0.5 ms
Lead Channel Setting Pacing Pulse Width: 0.5 ms
Lead Channel Setting Sensing Sensitivity: 0.5 mV
Pulse Gen Serial Number: 7021876

## 2019-01-17 NOTE — Telephone Encounter (Signed)
Spoke to Louis Juarez regarding ERI alert, Louis Juarez states he has appt with Dr. Rayann Heman 04/04/19 to discuss procedure. Will schedule for monthly battery checks. Louis Juarez denies questions at this time.

## 2019-01-18 ENCOUNTER — Telehealth: Payer: Self-pay

## 2019-01-18 NOTE — Telephone Encounter (Signed)
Spoke with patient. Discussed automatic monthly transmissions for battery. Explained we still only bill every 91 days. Pt agrees to call if he notices vibratory alert. Pt aware of upcoming appointment with Dr. Rayann Heman on 04/04/19.  [place] automatic transmission schedule updated.

## 2019-01-18 NOTE — Telephone Encounter (Signed)
Pt called with further questions about nearing ERI.

## 2019-01-23 ENCOUNTER — Other Ambulatory Visit (HOSPITAL_COMMUNITY): Payer: Self-pay | Admitting: Internal Medicine

## 2019-01-24 NOTE — Progress Notes (Signed)
Remote ICD transmission.   

## 2019-02-07 ENCOUNTER — Telehealth (HOSPITAL_COMMUNITY): Payer: Self-pay

## 2019-02-07 NOTE — Telephone Encounter (Signed)
Pt left message on triage phone requesting an appt as he has been trying to be seen since March. Called back, pt noted to have increasing SOB over time and requesting appt.  Advised of opening with NP today however patient cannot come in.  Appt made for tomorrow. Pt appreciative.

## 2019-02-08 ENCOUNTER — Other Ambulatory Visit (HOSPITAL_COMMUNITY): Payer: Self-pay | Admitting: Internal Medicine

## 2019-02-09 ENCOUNTER — Ambulatory Visit (HOSPITAL_COMMUNITY)
Admission: RE | Admit: 2019-02-09 | Discharge: 2019-02-09 | Disposition: A | Discharge status: Home / Self Care | Payer: BC Managed Care – PPO | Source: Ambulatory Visit | Attending: Internal Medicine | Admitting: Internal Medicine

## 2019-02-09 ENCOUNTER — Other Ambulatory Visit: Payer: Self-pay

## 2019-02-09 ENCOUNTER — Encounter (HOSPITAL_COMMUNITY): Payer: Self-pay | Admitting: Internal Medicine

## 2019-02-09 ENCOUNTER — Encounter (HOSPITAL_COMMUNITY): Payer: BC Managed Care – PPO | Admitting: Internal Medicine

## 2019-02-09 ENCOUNTER — Encounter (HOSPITAL_COMMUNITY): Payer: Self-pay | Admitting: *Deleted

## 2019-02-09 VITALS — BP 112/70 | HR 76 | Wt 211.1 lb

## 2019-02-09 DIAGNOSIS — R002 Palpitations: Secondary | ICD-10-CM | POA: Diagnosis not present

## 2019-02-09 DIAGNOSIS — I2510 Atherosclerotic heart disease of native coronary artery without angina pectoris: Secondary | ICD-10-CM | POA: Insufficient documentation

## 2019-02-09 DIAGNOSIS — I420 Dilated cardiomyopathy: Secondary | ICD-10-CM | POA: Diagnosis not present

## 2019-02-09 DIAGNOSIS — M79606 Pain in leg, unspecified: Secondary | ICD-10-CM | POA: Insufficient documentation

## 2019-02-09 DIAGNOSIS — Z85828 Personal history of other malignant neoplasm of skin: Secondary | ICD-10-CM | POA: Diagnosis not present

## 2019-02-09 DIAGNOSIS — E785 Hyperlipidemia, unspecified: Secondary | ICD-10-CM | POA: Diagnosis not present

## 2019-02-09 DIAGNOSIS — I493 Ventricular premature depolarization: Secondary | ICD-10-CM

## 2019-02-09 DIAGNOSIS — Z95810 Presence of automatic (implantable) cardiac defibrillator: Secondary | ICD-10-CM | POA: Insufficient documentation

## 2019-02-09 DIAGNOSIS — M791 Myalgia, unspecified site: Secondary | ICD-10-CM | POA: Diagnosis not present

## 2019-02-09 DIAGNOSIS — R0600 Dyspnea, unspecified: Secondary | ICD-10-CM | POA: Insufficient documentation

## 2019-02-09 DIAGNOSIS — G4700 Insomnia, unspecified: Secondary | ICD-10-CM | POA: Diagnosis not present

## 2019-02-09 DIAGNOSIS — I5022 Chronic systolic (congestive) heart failure: Secondary | ICD-10-CM

## 2019-02-09 DIAGNOSIS — Z79899 Other long term (current) drug therapy: Secondary | ICD-10-CM | POA: Insufficient documentation

## 2019-02-09 DIAGNOSIS — I110 Hypertensive heart disease with heart failure: Secondary | ICD-10-CM | POA: Diagnosis not present

## 2019-02-09 DIAGNOSIS — Z87891 Personal history of nicotine dependence: Secondary | ICD-10-CM | POA: Diagnosis not present

## 2019-02-09 DIAGNOSIS — Z7982 Long term (current) use of aspirin: Secondary | ICD-10-CM | POA: Diagnosis not present

## 2019-02-09 LAB — CK: Total CK: 101 U/L (ref 49–397)

## 2019-02-09 LAB — BASIC METABOLIC PANEL
Anion gap: 10 (ref 5–15)
BUN: 10 mg/dL (ref 8–23)
CO2: 27 mmol/L (ref 22–32)
Calcium: 9 mg/dL (ref 8.9–10.3)
Chloride: 101 mmol/L (ref 98–111)
Creatinine, Ser: 0.95 mg/dL (ref 0.61–1.24)
GFR calc Af Amer: >60 mL/min (ref >60)
GFR calc non Af Amer: >60 mL/min (ref >60)
Glucose, Bld: 128 mg/dL — ABNORMAL HIGH (ref 70–99)
Potassium: 4.6 mmol/L (ref 3.5–5.1)
Sodium: 138 mmol/L (ref 135–145)

## 2019-02-09 LAB — CBC
HCT: 47.6 % (ref 39.0–52.0)
Hemoglobin: 15.4 g/dL (ref 13.0–17.0)
MCH: 30.4 pg (ref 26.0–34.0)
MCHC: 32.4 g/dL (ref 30.0–36.0)
MCV: 94.1 fL (ref 80.0–100.0)
Platelets: 221 10*3/uL (ref 150–400)
RBC: 5.06 MIL/uL (ref 4.22–5.81)
RDW: 13.4 % (ref 11.5–15.5)
WBC: 9.8 10*3/uL (ref 4.0–10.5)
nRBC: 0 % (ref 0.0–0.2)

## 2019-02-09 LAB — BRAIN NATRIURETIC PEPTIDE: B Natriuretic Peptide: 35 pg/mL (ref 0.0–100.0)

## 2019-02-09 NOTE — Patient Instructions (Signed)
Labs done today  Chest x-ray needs to be done today in Radiology  Your physician has requested that you have an echocardiogram. Echocardiography is a painless test that uses sound waves to create images of your heart. It provides your doctor with information about the size and shape of your heart and how well your heart's chambers and valves are working. This procedure takes approximately one hour. There are no restrictions for this procedure.  Your provider has recommended that  you wear a Zio Patch for 14 days.  This monitor will record your heart rhythm for our review.  IF you have any symptoms while wearing the monitor please press the button.  If you have any issues with the patch or you notice a red or orange light on it please call the company at [phone].  Once you remove the patch please mail it back to the company as soon as possible so we can get the results.  Please contact us in 9 months to schedule your next appointment.  At the [facility], you and your health needs are our priority. As part of our continuing mission to provide you with exceptional heart care, we have created designated Provider Care Teams. These Care Teams include your primary Cardiologist (physician) and Advanced Practice Providers (APPs- Physician Assistants and Nurse Practitioners) who all work together to provide you with the care you need, when you need it.   You may see any of the following providers on your designated Care Team at your next follow up: Marland Kitchen Dr Glori Bickers . Dr Loralie Champagne . Darrick Grinder, NP

## 2019-02-09 NOTE — Progress Notes (Signed)
ADVANCED HF CLINIC NOTE  Patient ID: Louis Juarez., male   DOB: 1940/11/05, 78 y.o.   MRN: [id number]  PCP:  Dr. Serita Grammes Primary Cardiologist:  Dr. Jenkins Rouge (will not go back and see) Primary Electrophysiologist:  Dr. Rayann Heman    History of Present Illness: Louis Juarez. is a 78 y.o. male with h/o HTN, obesity, previous tobacco use (mild restriction on PFTs, no obstruction) and LBBB.  S/p St Jude CRT-D (7/13 followed by Dr. Rayann Heman)   He developed dyspnea/wheezing in summer of 2012. Referred to Dr. Gwenette Greet. Mild airway disease. Echocardiogram 06/30/11: EF 10-15%, inferior, septal, apical akinesis, mild LVH, severe LAE, mild RAE, moderately reduced RVSF, PASP 52.  Cath by Dr. Burt Knack. Mild non-obs CAD. LM 20-30%, LAD ok, LCx 30-40% RCA 30-40% prox 50% distal. LVEF 10-15%.  CPX test:  4/13: Spirometry: normal Peak VO2: 17.7 ml/kg/min predicted peak VO2: 86.5% (corrected to ideal weight VO2 24ml/kg/min) VE/VCO2 slope: 31.1 OUES: 2.11 Peak RER: 1.20 Ventilatory Threshold: 12.0 % predicted peak VO2: 58.7% VE/MVV: 64.9% PETCO2 at peak: 35 O2pulse: 15 % predicted O2pulse: 100%  Echo 7/13: EF 15%  Echo 12/13: 25-30% Echo 03/2013: EF 40-45% Echo 09/17/16 EF 45-50%   Follow up for Heart Failure:  Overall doing pretty well. Has been walking on TM but last week had episode where thighs got really sore after walking. Now better. Says he has had increasing DOE since the beginning of year. No CP. Weight is actually down. No edema, orthopnea, PND. + palpitations    Labs (10/14): K 4.7, creatinine 0.96  Past Medical History:  Diagnosis Date  . 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 CHF (congestive heart failure) ([place])   . Chronic systolic heart failure ([place])   . DCM (dilated cardiomyopathy) ([place])   . Erectile dysfunction   . HTN (hypertension)   . Hyperlipidemia   . 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 Insomnia     Current Outpatient Medications   Medication Sig Dispense Refill  . amitriptyline (ELAVIL) 100 MG tablet Take 100 mg by mouth at bedtime.      Marland Kitchen aspirin 81 MG tablet Take 81 mg by mouth daily.      . carvedilol (COREG) 25 MG tablet TAKE 1 TABLET (25 MG TOTAL) BY MOUTH 2 (TWO) TIMES DAILY WITH A MEAL. 180 tablet 3  . Colesevelam HCl (WELCHOL PO) Take by mouth every morning. Take as directed    . Docusate Calcium (STOOL SOFTENER PO) Take 250 mg by mouth 2 (two) times daily.    Marland Kitchen ezetimibe (ZETIA) 10 MG tablet Take 10 mg by mouth daily.    . furosemide (LASIX) 20 MG tablet TAKE 1 TABLET BY MOUTH TWICE A DAY 180 tablet 1  . LORazepam (ATIVAN) 1 MG tablet Take 1 mg by mouth 2 (two) times daily.    Marland Kitchen losartan (COZAAR) 100 MG tablet TAKE 1 TABLET BY MOUTH IN THE AM AND 1/2 TABLET IN THE PM 135 tablet 0  . Multiple Vitamins-Minerals (MULTIVITAMIN WITH MINERALS) tablet Take 1 tablet by mouth daily.      . pravastatin (PRAVACHOL) 80 MG tablet Take 80 mg by mouth daily.    Marland Kitchen spironolactone (ALDACTONE) 25 MG tablet TAKE 1 TABLET BY MOUTH EVERY DAY 90 tablet 1  . zolpidem (AMBIEN) 10 MG tablet Take 1 tablet by mouth at bedtime as needed for sleep.      No current facility-administered medications for this encounter.  Allergies: No Known Allergies  ROS:  Please see the history of present illness.   All other systems reviewed and negative.   Vitals:   02/09/19 0931  BP: 112/70  Pulse: 76  SpO2: 97%  Weight: 95.8 kg (211 lb 2 oz)   PHYSICAL EXAM: General:  Well appearing. No resp difficulty HEENT: normal Neck: supple. no JVD. Carotids 2+ bilat; no bruits. No lymphadenopathy or thryomegaly appreciated. Cor: PMI nondisplaced. Regular rate & rhythm. No rubs, gallops or murmurs. Lungs: clear Abdomen: obesity, soft, nontender, nondistended. No hepatosplenomegaly. No bruits or masses. Good bowel sounds. Extremities: no cyanosis, clubbing, rash, edema Neuro: alert & orientedx3, cranial nerves grossly intact. moves all 4  extremities w/o difficulty. Affect pleasant  ID interrogation: No VT/AF. 97% BiVpacing Volume ok Personally reviewed  ECG NSR 76 with v-pacing. 1 PVC. Personally reviewed   ASSESSMENT AND PLAN:   1. Chronic systolic HF: NICM, EF 19-14% (03/2013)  - Echo 09/17/16 EF 45-50%   - s/p St Jude Biv ICD. - A bit worse today NYHA II-early III. Volume status looks good on exam and by ICD interrogation - Symptoms worse but no clear objective reason for this. Perhaps due to lower activity in COVID period -  Repeat echo. Check labs & CXR - ICD reviewed personally. See interrogation above - Continue coreg 25 mg BID, losartan 150 mg daily and spironolactone 25 mg daily. - Reinforced the need and importance of daily weights, a low sodium diet, and fluid restriction (less than 2 L a day). Instructed to call the HF clinic if weight increases more than 3 lbs overnight or 5 lbs in a week.  2. Nonobstructive CAD:  - no s/s ischemia  Continue ASA, statin and BB.  - Cath 2012: Mild non-obs CAD. LM 20-30%, LAD ok, LCx 30-40% RCA 30-40% prox 50% distal. LVEF 10-15%. 3. HTN:  - Blood pressure well controlled. Continue current regimen. 4. Lipids - Managed by PCP. Goal LDL < 70 5. Leg pain - will check CK to rule out myositis 6. PVCs - place zio patch to quantify   Benay Spice 9:46 AM

## 2019-02-09 NOTE — Progress Notes (Signed)
Zio patch placed onto patient.  All instructions and information reviewed with patient, they verbalize understanding with no questions. 

## 2019-02-16 ENCOUNTER — Ambulatory Visit (HOSPITAL_COMMUNITY)
Admission: RE | Admit: 2019-02-16 | Discharge: 2019-02-16 | Disposition: A | Discharge status: Home / Self Care | Payer: BC Managed Care – PPO | Source: Ambulatory Visit | Attending: Internal Medicine | Admitting: Internal Medicine

## 2019-02-16 ENCOUNTER — Ambulatory Visit (INDEPENDENT_AMBULATORY_CARE_PROVIDER_SITE_OTHER): Payer: BC Managed Care – PPO | Admitting: *Deleted

## 2019-02-16 ENCOUNTER — Other Ambulatory Visit: Payer: Self-pay

## 2019-02-16 DIAGNOSIS — I5022 Chronic systolic (congestive) heart failure: Secondary | ICD-10-CM | POA: Insufficient documentation

## 2019-02-16 DIAGNOSIS — I428 Other cardiomyopathies: Secondary | ICD-10-CM | POA: Diagnosis not present

## 2019-02-16 DIAGNOSIS — E785 Hyperlipidemia, unspecified: Secondary | ICD-10-CM | POA: Diagnosis not present

## 2019-02-16 DIAGNOSIS — Z95810 Presence of automatic (implantable) cardiac defibrillator: Secondary | ICD-10-CM | POA: Diagnosis not present

## 2019-02-16 DIAGNOSIS — I447 Left bundle-branch block, unspecified: Secondary | ICD-10-CM | POA: Diagnosis not present

## 2019-02-16 LAB — CUP PACEART REMOTE DEVICE CHECK
Battery Remaining Longevity: 4 mo
Battery Remaining Percentage: 4 %
Battery Voltage: 2.62 V
Brady Statistic AP VP Percent: 3.2 %
Brady Statistic AP VS Percent: 1 %
Brady Statistic AS VP Percent: 94 %
Brady Statistic AS VS Percent: 1.1 %
Brady Statistic RA Percent Paced: 1.2 %
Date Time Interrogation Session: 20200729170856
HighPow Impedance: 86 Ohm
HighPow Impedance: 86 Ohm
Implantable Lead Implant Date: 20130726
Implantable Lead Implant Date: 20130726
Implantable Lead Implant Date: 20130726
Implantable Lead Location: 753858
Implantable Lead Location: 753859
Implantable Lead Location: 753860
Implantable Pulse Generator Implant Date: 20130726
Lead Channel Impedance Value: 1225 Ohm
Lead Channel Impedance Value: 400 Ohm
Lead Channel Impedance Value: 510 Ohm
Lead Channel Pacing Threshold Amplitude: 0.625 V
Lead Channel Pacing Threshold Amplitude: 1 V
Lead Channel Pacing Threshold Amplitude: 1.5 V
Lead Channel Pacing Threshold Pulse Width: 0.5 ms
Lead Channel Pacing Threshold Pulse Width: 0.5 ms
Lead Channel Pacing Threshold Pulse Width: 0.5 ms
Lead Channel Sensing Intrinsic Amplitude: 1.8 mV
Lead Channel Sensing Intrinsic Amplitude: 12 mV
Lead Channel Setting Pacing Amplitude: 1.625
Lead Channel Setting Pacing Amplitude: 2 V
Lead Channel Setting Pacing Amplitude: 2.5 V
Lead Channel Setting Pacing Pulse Width: 0.5 ms
Lead Channel Setting Pacing Pulse Width: 0.5 ms
Lead Channel Setting Sensing Sensitivity: 0.5 mV
Pulse Gen Serial Number: 7021876

## 2019-02-16 NOTE — Progress Notes (Signed)
  Echocardiogram 2D Echocardiogram has been performed.  Louis Juarez 02/16/2019, 10:34 AM

## 2019-02-24 NOTE — Progress Notes (Signed)
Remote ICD transmission.   

## 2019-03-01 ENCOUNTER — Other Ambulatory Visit (HOSPITAL_COMMUNITY): Payer: Self-pay | Admitting: Internal Medicine

## 2019-03-03 NOTE — Addendum Note (Signed)
Encounter addended by: Micki Riley, RN on: 03/03/2019 3:43 PM  Actions taken: Imaging Exam ended

## 2019-03-19 LAB — CUP PACEART REMOTE DEVICE CHECK
Battery Remaining Longevity: 1 mo
Battery Remaining Percentage: 3 %
Battery Voltage: 2.6 V
Brady Statistic AP VP Percent: 3.3 %
Brady Statistic AP VS Percent: 1 %
Brady Statistic AS VP Percent: 94 %
Brady Statistic AS VS Percent: 1 %
Brady Statistic RA Percent Paced: 1.3 %
Date Time Interrogation Session: 20200829080017
HighPow Impedance: 81 Ohm
HighPow Impedance: 81 Ohm
Implantable Lead Implant Date: 20130726
Implantable Lead Implant Date: 20130726
Implantable Lead Implant Date: 20130726
Implantable Lead Location: 753858
Implantable Lead Location: 753859
Implantable Lead Location: 753860
Implantable Pulse Generator Implant Date: 20130726
Lead Channel Impedance Value: 1200 Ohm
Lead Channel Impedance Value: 400 Ohm
Lead Channel Impedance Value: 560 Ohm
Lead Channel Pacing Threshold Amplitude: 0.625 V
Lead Channel Pacing Threshold Amplitude: 1.625 V
Lead Channel Pacing Threshold Amplitude: 1.75 V
Lead Channel Pacing Threshold Pulse Width: 0.5 ms
Lead Channel Pacing Threshold Pulse Width: 0.5 ms
Lead Channel Pacing Threshold Pulse Width: 0.5 ms
Lead Channel Sensing Intrinsic Amplitude: 1.8 mV
Lead Channel Sensing Intrinsic Amplitude: 12 mV
Lead Channel Setting Pacing Amplitude: 1.625
Lead Channel Setting Pacing Amplitude: 2.625
Lead Channel Setting Pacing Amplitude: 2.75 V
Lead Channel Setting Pacing Pulse Width: 0.5 ms
Lead Channel Setting Pacing Pulse Width: 0.5 ms
Lead Channel Setting Sensing Sensitivity: 0.5 mV
Pulse Gen Serial Number: 7021876

## 2019-03-21 ENCOUNTER — Ambulatory Visit (INDEPENDENT_AMBULATORY_CARE_PROVIDER_SITE_OTHER): Payer: BC Managed Care – PPO | Admitting: *Deleted

## 2019-03-21 DIAGNOSIS — Z95810 Presence of automatic (implantable) cardiac defibrillator: Secondary | ICD-10-CM

## 2019-03-29 ENCOUNTER — Encounter: Payer: Self-pay | Admitting: Cardiology

## 2019-03-29 NOTE — Progress Notes (Signed)
Remote ICD transmission.   

## 2019-03-31 ENCOUNTER — Telehealth: Payer: Self-pay | Admitting: Physician Assistant

## 2019-03-31 ENCOUNTER — Telehealth: Payer: Self-pay

## 2019-03-31 NOTE — Telephone Encounter (Signed)
Spoke with pt regarding appt on 04/04/19. Pt stated he would like to come in office because he does not have the necessary technology. Pt was advise to keep virtual visit and Dr Rayann Heman will call him on the phone. Pt agreed. Pt was advise to check vitals prior to appt. Pt questions and concerns were address.

## 2019-03-31 NOTE — Telephone Encounter (Signed)
Patient called because his device had buzzed twice and he was not sure what to do.  He stated the company called and said there was an automatic transmission when this happened.  He has not had palpitations.  He has not been lightheaded or dizzy.  He has not had chest pain or shortness of breath.  The device did not fire.  He stated his heart rate has increased from 65 to approximately 72 bpm.  Upon review of the last download, his battery life was noted to be less than 3 months.  I spoke with Dr. Rayann Heman regarding this.  Dr. Rayann Heman states that he should be contacted by our office tomorrow to get a download and they would be able to review the download that was done automatically as well.  At that time, a decision can be made as to how to proceed.  As long as he is asymptomatic, does not have to proceed to the emergency room.  I called Mr. Schwind back to let him know this information and he is agreeable to this plan.  Of note, he had not yet taken his evening dose of carvedilol, he normally takes that at bedtime.  I encouraged him to take it a little earlier tonight.  Mr. Flexer also has concerns, feeling that [facility] has become impersonal and more of a machine that keeps billing him for things that are unclear to him.  He is encouraged to discuss this with the appropriate people.  If he develops palpitation, his device fires, or he gets shortness of breath, he is to come to the emergency room.  Rosaria Ferries, PA-C 03/31/2019 8:49 PM [place] [phone]

## 2019-03-31 NOTE — Telephone Encounter (Signed)
Follow Up  Patient would rather his appointment that is scheduled for 04/04/19 at 9:30 am with Dr. Rayann Heman to be an in office visit. Patient states that he does not want to do a virtual visit and wants to appointment changed to an in office visit on that day. Please give patient a call back to confirm change.

## 2019-04-01 ENCOUNTER — Telehealth: Payer: Self-pay | Admitting: Internal Medicine

## 2019-04-01 NOTE — Telephone Encounter (Signed)
See phone note from 03/31/19.

## 2019-04-01 NOTE — Telephone Encounter (Signed)
°  1. Has your device fired? no  2. Is you device beeping? no  3. Are you experiencing draining or swelling at device site? no  4. Are you calling to see if we received your device transmission? no  5. Have you passed out? no  Patient called stating his def went off twice last night and twice this morning. Last night he called the monitor center and they told him to call the device clinic this morning.   Please route to [place]

## 2019-04-01 NOTE — Telephone Encounter (Signed)
Spoke with patient. Assisted with manual transmission for review. Transmission received--ICD at [facility] as of 03/31/19. Vibratory alert for ERI automatically off due to successful transmission. Explained to patient. Advised to call back if additional vibratory alerts received. Pt has f/u with Dr. Rayann Heman via virtual visit on 04/04/19. Advised gen change procedure will be discussed during this visit. Pt verbalizes understanding and thanked me for call and explanation.

## 2019-04-04 ENCOUNTER — Telehealth (INDEPENDENT_AMBULATORY_CARE_PROVIDER_SITE_OTHER): Payer: BC Managed Care – PPO | Admitting: Internal Medicine

## 2019-04-04 ENCOUNTER — Encounter: Payer: Self-pay | Admitting: Internal Medicine

## 2019-04-04 ENCOUNTER — Other Ambulatory Visit: Payer: Self-pay

## 2019-04-04 ENCOUNTER — Telehealth: Payer: Self-pay

## 2019-04-04 VITALS — HR 69 | Ht 70.5 in | Wt 212.2 lb

## 2019-04-04 DIAGNOSIS — I428 Other cardiomyopathies: Secondary | ICD-10-CM | POA: Diagnosis not present

## 2019-04-04 DIAGNOSIS — I10 Essential (primary) hypertension: Secondary | ICD-10-CM

## 2019-04-04 DIAGNOSIS — I5022 Chronic systolic (congestive) heart failure: Secondary | ICD-10-CM | POA: Diagnosis not present

## 2019-04-04 DIAGNOSIS — I493 Ventricular premature depolarization: Secondary | ICD-10-CM

## 2019-04-04 NOTE — Telephone Encounter (Signed)
-----   Message from Thompson Grayer, MD sent at 04/04/2019  9:51 AM EDT ----- Please schedule ICD (St Jude)  generator change at the next available time.

## 2019-04-04 NOTE — H&P (View-Only) (Signed)
Electrophysiology TeleHealth Note  Due to national recommendations of social distancing due to [place] 19, an audio telehealth visit is felt to be most appropriate for this patient at this time.  Verbal consent was obtained by me for the telehealth visit today.  The patient does not have capability for a virtual visit.  A phone visit is therefore required today.   Date:  04/04/2019   ID:  Louis Christians., DOB 12-05-1940, MRN [id number]  Location: patient's home  Provider location:  [facility]  Evaluation Performed: Follow-up visit  PCP:  Corrington, Delsa Grana, MD   Electrophysiologist:  Dr Rayann Heman  Chief Complaint:  CHF  History of Present Illness:    Louis Juarez. is a 78 y.o. male who presents via telehealth conferencing today.  Since last being seen in our clinic, the patient reports doing very well.  His ICD has reached ERI.  Today, he denies symptoms of palpitations, chest pain, lower extremity edema, dizziness, presyncope, or syncope. He has had some issues with SOB.  energy is good.  He is active.   The patient is otherwise without complaint today.  The patient denies symptoms of fevers, chills, cough, or new SOB worrisome for COVID 19.  Past Medical History:  Diagnosis Date  . 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 CHF (congestive heart failure) ([place])   . Chronic systolic heart failure ([place])   . DCM (dilated cardiomyopathy) ([place])   . Erectile dysfunction   . HTN (hypertension)   . Hyperlipidemia   . 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 Insomnia     Past Surgical History:  Procedure Laterality Date  . BI-VENTRICULAR IMPLANTABLE CARDIOVERTER DEFIBRILLATOR N/A 02/13/2012   Procedure: BI-VENTRICULAR IMPLANTABLE CARDIOVERTER DEFIBRILLATOR  (CRT-D);  Surgeon: Thompson Grayer, MD;  Location: [facility] C[facility] LAB;  Service: Cardiovascular;  Laterality: N/A;  . CARDIAC DEFIBRILLATOR PLACEMENT  02/13/2012   SJM Quadra Assura BIV ICD implanted by Dr Rayann Heman   . NASAL SINUS SURGERY  93, 97, 2010, 2006    Current Outpatient Medications  Medication Sig Dispense Refill  . amitriptyline (ELAVIL) 100 MG tablet Take 100 mg by mouth at bedtime.      Marland Kitchen aspirin 81 MG tablet Take 81 mg by mouth daily.      . carvedilol (COREG) 25 MG tablet TAKE 1 TABLET (25 MG TOTAL) BY MOUTH 2 (TWO) TIMES DAILY WITH A MEAL. 180 tablet 3  . Colesevelam HCl (WELCHOL PO) Take by mouth every morning. Take as directed    . ezetimibe (ZETIA) 10 MG tablet Take 10 mg by mouth daily.    . furosemide (LASIX) 20 MG tablet TAKE 1 TABLET BY MOUTH TWICE A DAY 180 tablet 1  . levocetirizine (XYZAL) 5 MG tablet Take 1 tablet by mouth daily.    Marland Kitchen LORazepam (ATIVAN) 1 MG tablet Take 1 mg by mouth 2 (two) times daily.    Marland Kitchen losartan (COZAAR) 100 MG tablet TAKE 1 TABLET BY MOUTH IN THE AM AND 1/2 TABLET IN THE PM 135 tablet 3  . Multiple Vitamins-Minerals (MULTIVITAMIN WITH MINERALS) tablet Take 1 tablet by mouth daily.      . pravastatin (PRAVACHOL) 80 MG tablet Take 80 mg by mouth daily.    Marland Kitchen spironolactone (ALDACTONE) 25 MG tablet TAKE 1 TABLET BY MOUTH EVERY DAY 90 tablet 1  . zolpidem (AMBIEN) 10 MG tablet Take 1 tablet by mouth at bedtime as needed for sleep.  No current facility-administered medications for this visit.     Allergies:   Patient has no known allergies.   Social History:  The patient  reports that he quit smoking about 17 years ago. His smoking use included cigarettes. He has a 30.00 pack-year smoking history. He has never used smokeless tobacco. He reports that he does not drink alcohol or use drugs.   Family History:  The patient's family history includes Colon cancer in his father; Melanoma in his mother and sister.   ROS:  Please see the history of present illness.   All other systems are personally reviewed and negative.    Exam:    Vital Signs:  Pulse 69   Ht 5' 10.5" (1.791 m)   Wt 212 lb 3.2 oz (96.3 kg)   BMI 30.02 kg/m   Well sounding, alert  and conversant   Labs/Other Tests and Data Reviewed:    Recent Labs: 02/09/2019: B Natriuretic Peptide 35.0; BUN 10; Creatinine, Ser 0.95; Hemoglobin 15.4; Platelets 221; Potassium 4.6; Sodium 138   Wt Readings from Last 3 Encounters:  04/04/19 212 lb 3.2 oz (96.3 kg)  02/09/19 211 lb 2 oz (95.8 kg)  03/31/18 199 lb 4.8 oz (90.4 kg)     Last device remote is reviewed from [place] PDF which reveals normal device function, no arrhythmias He is at [facility]  He wore a monitor in July which I have reviewed which reveals PVCs (Burden 2.7%) and NSVT  Echo 02/16/2019 revealed EF 45-50% EF was 25% 06/30/2012  ASSESSMENT & PLAN:    1.  Nonischemic CM (EF 45%) His CRT-D device has reached ERI.  He did respond to CRT (EF 25% 2013-->45%).  He has NYHA Class III symptoms  Risks, benefits, and alternatives to BiV ICD pulse generator replacement were discussed in detail today.  The patient understands that risks include but are not limited to bleeding, infection, pneumothorax, perforation, tamponade, vascular damage, renal failure, MI, stroke, death, inappropriate shocks, damage to his existing leads, and lead dislodgement and wishes to proceed.  We will therefore schedule the procedure at the next available time.  We will plan to optimize his device after generator change.  2. PVCs Burden 2.7% by monitor in July. Continue current medical therapy  3. HTN Stable No change required today  Patient Risk:  after full review of this patients clinical status, I feel that they are at moderate risk at this time.  Today, I have spent 20 minutes with the patient with telehealth technology discussing arrhythmia management .    Army Fossa, MD  04/04/2019 9:48 AM     [address] [place] [address] [phone] (office) [phone] (fax)

## 2019-04-04 NOTE — Telephone Encounter (Signed)
Call placed to Pt.  Pt scheduled for gen change on 05/03/2019   Work up completed.

## 2019-04-04 NOTE — Progress Notes (Signed)
Electrophysiology TeleHealth Note  Due to national recommendations of social distancing due to [place] 19, an audio telehealth visit is felt to be most appropriate for this patient at this time.  Verbal consent was obtained by me for the telehealth visit today.  The patient does not have capability for a virtual visit.  A phone visit is therefore required today.   Date:  04/04/2019   ID:  Louis Christians., DOB June 28, 1941, MRN [id number]  Location: patient's home  Provider location:  [facility]  Evaluation Performed: Follow-up visit  PCP:  Louis Juarez, Louis Grana, Louis Juarez   Electrophysiologist:  Dr Rayann Heman  Chief Complaint:  CHF  History of Present Illness:    Louis Cornelison. is a 78 y.o. male who presents via telehealth conferencing today.  Since last being seen in our clinic, the patient reports doing very well.  His ICD has reached ERI.  Today, he denies symptoms of palpitations, chest pain, lower extremity edema, dizziness, presyncope, or syncope. He has had some issues with SOB.  energy is good.  He is active.   The patient is otherwise without complaint today.  The patient denies symptoms of fevers, chills, cough, or new SOB worrisome for COVID 19.  Past Medical History:  Diagnosis Date  . Cancer ([place])    basel cell on hand  . CHF (congestive heart failure) ([place])   . Chronic systolic heart failure ([place])   . DCM (dilated cardiomyopathy) ([place])   . Erectile dysfunction   . HTN (hypertension)   . Hyperlipidemia   . 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 Insomnia     Past Surgical History:  Procedure Laterality Date  . BI-VENTRICULAR IMPLANTABLE CARDIOVERTER DEFIBRILLATOR N/A 02/13/2012   Procedure: BI-VENTRICULAR IMPLANTABLE CARDIOVERTER DEFIBRILLATOR  (CRT-D);  Surgeon: Thompson Grayer, Louis Juarez;  Location: [facility] CATH LAB;  Service: Cardiovascular;  Laterality: N/A;  . CARDIAC DEFIBRILLATOR PLACEMENT  02/13/2012   SJM Quadra Assura BIV ICD implanted by Dr Rayann Heman   . NASAL SINUS SURGERY  93, 97, 2010, 2006    Current Outpatient Medications  Medication Sig Dispense Refill  . amitriptyline (ELAVIL) 100 MG tablet Take 100 mg by mouth at bedtime.      Marland Kitchen aspirin 81 MG tablet Take 81 mg by mouth daily.      . carvedilol (COREG) 25 MG tablet TAKE 1 TABLET (25 MG TOTAL) BY MOUTH 2 (TWO) TIMES DAILY WITH A MEAL. 180 tablet 3  . Colesevelam HCl (WELCHOL PO) Take by mouth every morning. Take as directed    . ezetimibe (ZETIA) 10 MG tablet Take 10 mg by mouth daily.    . furosemide (LASIX) 20 MG tablet TAKE 1 TABLET BY MOUTH TWICE A DAY 180 tablet 1  . levocetirizine (XYZAL) 5 MG tablet Take 1 tablet by mouth daily.    Marland Kitchen LORazepam (ATIVAN) 1 MG tablet Take 1 mg by mouth 2 (two) times daily.    Marland Kitchen losartan (COZAAR) 100 MG tablet TAKE 1 TABLET BY MOUTH IN THE AM AND 1/2 TABLET IN THE PM 135 tablet 3  . Multiple Vitamins-Minerals (MULTIVITAMIN WITH MINERALS) tablet Take 1 tablet by mouth daily.      . pravastatin (PRAVACHOL) 80 MG tablet Take 80 mg by mouth daily.    Marland Kitchen spironolactone (ALDACTONE) 25 MG tablet TAKE 1 TABLET BY MOUTH EVERY DAY 90 tablet 1  . zolpidem (AMBIEN) 10 MG tablet Take 1 tablet by mouth at bedtime as needed for sleep.  No current facility-administered medications for this visit.     Allergies:   Patient has no known allergies.   Social History:  The patient  reports that he quit smoking about 17 years ago. His smoking use included cigarettes. He has a 30.00 pack-year smoking history. He has never used smokeless tobacco. He reports that he does not drink alcohol or use drugs.   Family History:  The patient's family history includes Colon cancer in his father; Melanoma in his mother and sister.   ROS:  Please see the history of present illness.   All other systems are personally reviewed and negative.    Exam:    Vital Signs:  Pulse 69   Ht 5' 10.5" (1.791 m)   Wt 212 lb 3.2 oz (96.3 kg)   BMI 30.02 kg/m   Well sounding, alert  and conversant   Labs/Other Tests and Data Reviewed:    Recent Labs: 02/09/2019: B Natriuretic Peptide 35.0; BUN 10; Creatinine, Ser 0.95; Hemoglobin 15.4; Platelets 221; Potassium 4.6; Sodium 138   Wt Readings from Last 3 Encounters:  04/04/19 212 lb 3.2 oz (96.3 kg)  02/09/19 211 lb 2 oz (95.8 kg)  03/31/18 199 lb 4.8 oz (90.4 kg)     Last device remote is reviewed from [place] PDF which reveals normal device function, no arrhythmias He is at [facility]  He wore a monitor in July which I have reviewed which reveals PVCs (Burden 2.7%) and NSVT  Echo 02/16/2019 revealed EF 45-50% EF was 25% 06/30/2012  ASSESSMENT & PLAN:    1.  Nonischemic CM (EF 45%) His CRT-D device has reached ERI.  He did respond to CRT (EF 25% 2013-->45%).  He has NYHA Class III symptoms  Risks, benefits, and alternatives to BiV ICD pulse generator replacement were discussed in detail today.  The patient understands that risks include but are not limited to bleeding, infection, pneumothorax, perforation, tamponade, vascular damage, renal failure, MI, stroke, death, inappropriate shocks, damage to his existing leads, and lead dislodgement and wishes to proceed.  We will therefore schedule the procedure at the next available time.  We will plan to optimize his device after generator change.  2. PVCs Burden 2.7% by monitor in July. Continue current medical therapy  3. HTN Stable No change required today  Patient Risk:  after full review of this patients clinical status, I feel that they are at moderate risk at this time.  Today, I have spent 20 minutes with the patient with telehealth technology discussing arrhythmia management .    Army Fossa, Louis Juarez  04/04/2019 9:48 AM     [address] [place] [address] [phone] (office) [phone] (fax)

## 2019-04-21 ENCOUNTER — Ambulatory Visit (INDEPENDENT_AMBULATORY_CARE_PROVIDER_SITE_OTHER): Payer: BC Managed Care – PPO | Admitting: *Deleted

## 2019-04-21 DIAGNOSIS — I428 Other cardiomyopathies: Secondary | ICD-10-CM | POA: Diagnosis not present

## 2019-04-21 LAB — CUP PACEART REMOTE DEVICE CHECK
Battery Remaining Longevity: 0 mo
Battery Voltage: 2.59 V
Brady Statistic AP VP Percent: 3.6 %
Brady Statistic AP VS Percent: 1 %
Brady Statistic AS VP Percent: 93 %
Brady Statistic AS VS Percent: 1 %
Brady Statistic RA Percent Paced: 1.4 %
Brady Statistic RV Percent Paced: 97 %
Date Time Interrogation Session: 20200930015336
HighPow Impedance: 82 Ohm
HighPow Impedance: 82 Ohm
Implantable Lead Implant Date: 20130726
Implantable Lead Implant Date: 20130726
Implantable Lead Implant Date: 20130726
Implantable Lead Location: 753858
Implantable Lead Location: 753859
Implantable Lead Location: 753860
Implantable Pulse Generator Implant Date: 20130726
Lead Channel Impedance Value: 1175 Ohm
Lead Channel Impedance Value: 360 Ohm
Lead Channel Impedance Value: 490 Ohm
Lead Channel Pacing Threshold Amplitude: 0.5 V
Lead Channel Pacing Threshold Amplitude: 1 V
Lead Channel Pacing Threshold Amplitude: 1.5 V
Lead Channel Pacing Threshold Pulse Width: 0.5 ms
Lead Channel Pacing Threshold Pulse Width: 0.5 ms
Lead Channel Pacing Threshold Pulse Width: 0.5 ms
Lead Channel Sensing Intrinsic Amplitude: 12 mV
Lead Channel Sensing Intrinsic Amplitude: 2.6 mV
Lead Channel Setting Pacing Amplitude: 1.5 V
Lead Channel Setting Pacing Amplitude: 2 V
Lead Channel Setting Pacing Amplitude: 2.5 V
Lead Channel Setting Pacing Pulse Width: 0.5 ms
Lead Channel Setting Pacing Pulse Width: 0.5 ms
Lead Channel Setting Sensing Sensitivity: 0.5 mV
Pulse Gen Serial Number: 7021876

## 2019-04-27 NOTE — Progress Notes (Signed)
Remote ICD transmission.   

## 2019-04-29 ENCOUNTER — Other Ambulatory Visit: Payer: BC Managed Care – PPO | Admitting: *Deleted

## 2019-04-29 ENCOUNTER — Other Ambulatory Visit: Payer: Self-pay

## 2019-04-29 ENCOUNTER — Other Ambulatory Visit (HOSPITAL_COMMUNITY)
Admission: RE | Admit: 2019-04-29 | Discharge: 2019-04-29 | Disposition: A | Discharge status: Home / Self Care | Payer: BC Managed Care – PPO | Source: Ambulatory Visit | Attending: Internal Medicine | Admitting: Internal Medicine

## 2019-04-29 DIAGNOSIS — Z20828 Contact with and (suspected) exposure to other viral communicable diseases: Secondary | ICD-10-CM | POA: Insufficient documentation

## 2019-04-29 DIAGNOSIS — I428 Other cardiomyopathies: Secondary | ICD-10-CM

## 2019-04-29 DIAGNOSIS — Z01812 Encounter for preprocedural laboratory examination: Secondary | ICD-10-CM | POA: Insufficient documentation

## 2019-04-29 DIAGNOSIS — I5022 Chronic systolic (congestive) heart failure: Secondary | ICD-10-CM

## 2019-04-29 LAB — BASIC METABOLIC PANEL
BUN/Creatinine Ratio: 15 (ref 10–24)
BUN: 14 mg/dL (ref 8–27)
CO2: 25 mmol/L (ref 20–29)
Calcium: 9 mg/dL (ref 8.6–10.2)
Chloride: 98 mmol/L (ref 96–106)
Creatinine, Ser: 0.92 mg/dL (ref 0.76–1.27)
GFR calc Af Amer: 92 mL/min/{1.73_m2} (ref >59)
GFR calc non Af Amer: 79 mL/min/{1.73_m2} (ref >59)
Glucose: 150 mg/dL — ABNORMAL HIGH (ref 65–99)
Potassium: 4.6 mmol/L (ref 3.5–5.2)
Sodium: 137 mmol/L (ref 134–144)

## 2019-04-29 LAB — CBC WITH DIFFERENTIAL/PLATELET
Basophils Absolute: 0.1 10*3/uL (ref 0.0–0.2)
Basos: 1 %
EOS (ABSOLUTE): 0.2 10*3/uL (ref 0.0–0.4)
Eos: 2 %
Hematocrit: 43.4 % (ref 37.5–51.0)
Hemoglobin: 14.9 g/dL (ref 13.0–17.7)
Immature Grans (Abs): 0 10*3/uL (ref 0.0–0.1)
Immature Granulocytes: 0 %
Lymphocytes Absolute: 2.6 10*3/uL (ref 0.7–3.1)
Lymphs: 29 %
MCH: 31.8 pg (ref 26.6–33.0)
MCHC: 34.3 g/dL (ref 31.5–35.7)
MCV: 93 fL (ref 79–97)
Monocytes Absolute: 0.8 10*3/uL (ref 0.1–0.9)
Monocytes: 9 %
Neutrophils Absolute: 5.2 10*3/uL (ref 1.4–7.0)
Neutrophils: 59 %
Platelets: 227 10*3/uL (ref 150–450)
RBC: 4.68 x10E6/uL (ref 4.14–5.80)
RDW: 12.6 % (ref 11.6–15.4)
WBC: 8.9 10*3/uL (ref 3.4–10.8)

## 2019-04-30 LAB — NOVEL CORONAVIRUS, NAA (HOSP ORDER, SEND-OUT TO REF LAB; TAT 18-24 HRS): SARS-CoV-2, NAA: NOT DETECTED

## 2019-05-03 ENCOUNTER — Ambulatory Visit (HOSPITAL_COMMUNITY)
Admission: RE | Disposition: A | Discharge status: Home / Self Care | Payer: Self-pay | Source: Home / Self Care | Attending: Internal Medicine

## 2019-05-03 ENCOUNTER — Ambulatory Visit (HOSPITAL_COMMUNITY)
Admission: RE | Admit: 2019-05-03 | Discharge: 2019-05-03 | Disposition: A | Discharge status: Home / Self Care | Payer: BC Managed Care – PPO | Attending: Internal Medicine | Admitting: Internal Medicine

## 2019-05-03 ENCOUNTER — Other Ambulatory Visit: Payer: Self-pay

## 2019-05-03 DIAGNOSIS — Z79899 Other long term (current) drug therapy: Secondary | ICD-10-CM | POA: Diagnosis not present

## 2019-05-03 DIAGNOSIS — I447 Left bundle-branch block, unspecified: Secondary | ICD-10-CM | POA: Diagnosis not present

## 2019-05-03 DIAGNOSIS — Z006 Encounter for examination for normal comparison and control in clinical research program: Secondary | ICD-10-CM | POA: Diagnosis not present

## 2019-05-03 DIAGNOSIS — Z4502 Encounter for adjustment and management of automatic implantable cardiac defibrillator: Secondary | ICD-10-CM | POA: Insufficient documentation

## 2019-05-03 DIAGNOSIS — I5022 Chronic systolic (congestive) heart failure: Secondary | ICD-10-CM | POA: Insufficient documentation

## 2019-05-03 DIAGNOSIS — E785 Hyperlipidemia, unspecified: Secondary | ICD-10-CM | POA: Insufficient documentation

## 2019-05-03 DIAGNOSIS — Z7982 Long term (current) use of aspirin: Secondary | ICD-10-CM | POA: Diagnosis not present

## 2019-05-03 DIAGNOSIS — Z87891 Personal history of nicotine dependence: Secondary | ICD-10-CM | POA: Insufficient documentation

## 2019-05-03 DIAGNOSIS — I110 Hypertensive heart disease with heart failure: Secondary | ICD-10-CM | POA: Diagnosis not present

## 2019-05-03 DIAGNOSIS — G4700 Insomnia, unspecified: Secondary | ICD-10-CM | POA: Insufficient documentation

## 2019-05-03 DIAGNOSIS — I428 Other cardiomyopathies: Secondary | ICD-10-CM | POA: Insufficient documentation

## 2019-05-03 HISTORY — PX: BI-VENTRICULAR IMPLANTABLE CARDIOVERTER DEFIBRILLATOR  (CRT-D): SHX5747

## 2019-05-03 HISTORY — PX: BIV ICD GENERATOR CHANGEOUT: EP1194

## 2019-05-03 LAB — SURGICAL PCR SCREEN
MRSA, PCR: NEGATIVE
Staphylococcus aureus: NEGATIVE

## 2019-05-03 SURGERY — BIV ICD GENERATOR CHANGEOUT

## 2019-05-03 MED ORDER — LIDOCAINE HCL (PF) 1 % IJ SOLN
INTRAMUSCULAR | Status: DC | PRN
  Administered 2019-05-03: 60 mL

## 2019-05-03 MED ORDER — ACETAMINOPHEN 325 MG PO TABS: 325.0000 mg | ORAL_TABLET | ORAL | Status: DC | PRN

## 2019-05-03 MED ORDER — LIDOCAINE HCL (PF) 1 % IJ SOLN
INTRAMUSCULAR | Status: AC
  Filled 2019-05-03: qty 30

## 2019-05-03 MED ORDER — CEFAZOLIN SODIUM-DEXTROSE 2-4 GM/100ML-% IV SOLN
2.0000 g | INTRAVENOUS | Status: AC
  Administered 2019-05-03: 15:00:00 2 g via INTRAVENOUS

## 2019-05-03 MED ORDER — MIDAZOLAM HCL 5 MG/5ML IJ SOLN
INTRAMUSCULAR | Status: AC
  Filled 2019-05-03: qty 5

## 2019-05-03 MED ORDER — SODIUM CHLORIDE 0.9% FLUSH: 3.0000 mL | INTRAVENOUS | Status: DC | PRN

## 2019-05-03 MED ORDER — SODIUM CHLORIDE 0.9 % IV SOLN: 250.0000 mL | INTRAVENOUS | Status: DC | PRN

## 2019-05-03 MED ORDER — MUPIROCIN 2 % EX OINT: 1.0000 "application " | TOPICAL_OINTMENT | Freq: Once | CUTANEOUS | Status: DC

## 2019-05-03 MED ORDER — ONDANSETRON HCL 4 MG/2ML IJ SOLN: 4.0000 mg | Freq: Four times a day (QID) | INTRAMUSCULAR | Status: DC | PRN

## 2019-05-03 MED ORDER — CEFAZOLIN SODIUM-DEXTROSE 2-4 GM/100ML-% IV SOLN
INTRAVENOUS | Status: AC
  Filled 2019-05-03: qty 100

## 2019-05-03 MED ORDER — SODIUM CHLORIDE 0.9% FLUSH: 3.0000 mL | Freq: Two times a day (BID) | INTRAVENOUS | Status: DC

## 2019-05-03 MED ORDER — FENTANYL CITRATE (PF) 100 MCG/2ML IJ SOLN
INTRAMUSCULAR | Status: AC
  Filled 2019-05-03: qty 2

## 2019-05-03 MED ORDER — FENTANYL CITRATE (PF) 100 MCG/2ML IJ SOLN
INTRAMUSCULAR | Status: DC | PRN
  Administered 2019-05-03: 12.5 ug via INTRAVENOUS

## 2019-05-03 MED ORDER — SODIUM CHLORIDE 0.9 % IV SOLN
80.0000 mg | INTRAVENOUS | Status: AC
  Administered 2019-05-03: 80 mg

## 2019-05-03 MED ORDER — SODIUM CHLORIDE 0.9 % IV SOLN
INTRAVENOUS | Status: AC
  Filled 2019-05-03: qty 2

## 2019-05-03 MED ORDER — MUPIROCIN 2 % EX OINT
TOPICAL_OINTMENT | CUTANEOUS | Status: AC
  Administered 2019-05-03: 12:00:00
  Filled 2019-05-03: qty 22

## 2019-05-03 MED ORDER — MIDAZOLAM HCL 5 MG/5ML IJ SOLN
INTRAMUSCULAR | Status: DC | PRN
  Administered 2019-05-03: 1 mg via INTRAVENOUS

## 2019-05-03 MED ORDER — SODIUM CHLORIDE 0.9 % IV SOLN
INTRAVENOUS | Status: DC
  Administered 2019-05-03: 12:00:00 via INTRAVENOUS

## 2019-05-03 MED ORDER — CHLORHEXIDINE GLUCONATE 4 % EX LIQD: 60.0000 mL | Freq: Once | CUTANEOUS | Status: DC

## 2019-05-03 SURGICAL SUPPLY — 4 items
CABLE SURGICAL S-101-97-12 (CABLE) ×3 IMPLANT
ICD GALLANT HFCRTD CDHFA500Q (ICD Generator) ×3 IMPLANT
PAD PRO RADIOLUCENT 2001M-C (PAD) ×3 IMPLANT
TRAY PACEMAKER INSERTION (PACKS) ×3 IMPLANT

## 2019-05-03 NOTE — Interval H&P Note (Signed)
History and Physical Interval Note:  05/03/2019 2:17 PM  Louis Juarez.  has presented today for surgery, with the diagnosis of ERI.  The various methods of treatment have been discussed with the patient and family. After consideration of risks, benefits and other options for treatment, the patient has consented to  Procedure(s): BIV ICD [place] (N/A) as a surgical intervention.  The patient's history has been reviewed, patient examined, no change in status, stable for surgery.  I have reviewed the patient's chart and labs.  Questions were answered to the patient's satisfaction.     ICD Criteria  Current LVEF:45%. Within 12 months prior to implant: Yes   Heart failure history: Yes, Class III  Cardiomyopathy history: Yes, Non-Ischemic Cardiomyopathy.  Atrial Fibrillation/Atrial Flutter: No.  Ventricular tachycardia history: No.  Cardiac arrest history: No.  History of syndromes with risk of sudden death: No.  Previous ICD: Yes, Reason for ICD:  Primary prevention.  Current ICD indication: Primary  PPM indication:  Yes,  CRT.  Class I or II Bradycardia indication present: No  Beta Blocker therapy for 3 or more months: Yes, prescribed.   Ace Inhibitor/ARB therapy for 3 or more months: Yes, prescribed.  The patient's chart has been reviewed and they meet criteria for BiV ICD generator change.  I have had a thorough discussion with the patient reviewing options.  The patient has had opportunities to ask questions and have them answered. The patient and I have decided together through the [place] Decision Support Tool to proceed with BiV ICD generator change at this time.  Risks, benefits, alternatives to BiV ICD generator change were discussed in detail with the patient today. The patient  understands that the risks include but are not limited to bleeding, infection, pneumothorax, perforation, tamponade, vascular damage, renal failure, MI, stroke, death,  inappropriate shocks, and lead dislodgement and wishes to proceed.    Thompson Grayer

## 2019-05-03 NOTE — Progress Notes (Signed)
Dr Rayann Heman in and okay to d/c home

## 2019-05-03 NOTE — Interval H&P Note (Signed)
History and Physical Interval Note:  05/03/2019 2:16 PM  [name]  has presented today for surgery, with the diagnosis of ERI.  The various methods of treatment have been discussed with the patient and family. After consideration of risks, benefits and other options for treatment, the patient has consented to  Procedure(s): BIV ICD [place] (N/A) as a surgical intervention.  The patient's history has been reviewed, patient examined, no change in status, stable for surgery.  I have reviewed the patient's chart and labs.  Questions were answered to the patient's satisfaction.     Thompson Grayer

## 2019-05-03 NOTE — Discharge Instructions (Signed)
Pacemaker Battery Change, Care After This sheet gives you information about how to care for yourself after your procedure. Your health care provider may also give you more specific instructions. If you have problems or questions, contact your health care provider. What can I expect after the procedure? After your procedure, it is common to have:  Pain or soreness at the site where the pacemaker was inserted.  Swelling at the site where the pacemaker was inserted. Follow these instructions at home: Incision care   Keep the incision clean and dry. ? Do not take baths, swim, or use a hot tub until your health care provider approves. ? You may shower the day after your procedure, or as directed by your health care provider. ? Pat the area dry with a clean towel. Do not rub the area. This may cause bleeding.  Follow instructions from your health care provider about how to take care of your incision. Make sure you: ? Wash your hands with soap and water before you change your bandage (dressing). If soap and water are not available, use hand sanitizer. ? Change your dressing as told by your health care provider. ? Leave stitches (sutures), skin glue, or adhesive strips in place. These skin closures may need to stay in place for 2 weeks or longer. If adhesive strip edges start to loosen and curl up, you may trim the loose edges. Do not remove adhesive strips completely unless your health care provider tells you to do that.  Check your incision area every day for signs of infection. Check for: ? More redness, swelling, or pain. ? More fluid or blood. ? Warmth. ? Pus or a bad smell. Activity  Do not lift anything that is heavier than 10 lb (4.5 kg) until your health care provider says it is okay to do so.  For the first 2 weeks, or as long as told by your health care provider: ? Avoid lifting your left arm higher than your shoulder. ? Be gentle when you move your arms over your head. It is okay  to raise your arm to comb your hair. ? Avoid strenuous exercise.  Ask your health care provider when it is okay to: ? Resume your normal activities. ? Return to work or school. ? Resume sexual activity. Eating and drinking  Eat a heart-healthy diet. This should include plenty of fresh fruits and vegetables, whole grains, low-fat dairy products, and lean protein like chicken and fish.  Limit alcohol intake to no more than 1 drink a day for non-pregnant women and 2 drinks a day for men. One drink equals 12 oz of beer, 5 oz of wine, or 1 oz of hard liquor.  Check ingredients and nutrition facts on packaged foods and beverages. Avoid the following types of food: ? Food that is high in salt (sodium). ? Food that is high in saturated fat, like full-fat dairy or red meat. ? Food that is high in trans fat, like fried food. ? Food and drinks that are high in sugar. Lifestyle  Do not use any products that contain nicotine or tobacco, such as cigarettes and e-cigarettes. If you need help quitting, ask your health care provider.  Take steps to manage and control your weight.  Get regular exercise. Aim for 150 minutes of moderate-intensity exercise (such as walking or yoga) or 75 minutes of vigorous exercise (such as running or swimming) each week.  Manage other health problems, such as diabetes or high blood pressure. Ask your health  care provider how you can manage these conditions. General instructions  Do not drive for 24 hours after your procedure if you were given a medicine to help you relax (sedative).  Take over-the-counter and prescription medicines only as told by your health care provider.  Avoid putting pressure on the area where the pacemaker was placed.  If you need an MRI after your pacemaker has been placed, be sure to tell the health care provider who orders the MRI that you have a pacemaker.  Avoid close and prolonged exposure to electrical devices that have strong  magnetic fields. These include: ? Cell phones. Avoid keeping them in a pocket near the pacemaker, and try using the ear opposite the pacemaker. ? MP3 players. ? Household appliances, like microwaves. ? Metal detectors. ? Electric generators. ? High-tension wires.  Keep all follow-up visits as directed by your health care provider. This is important. Contact a health care provider if:  You have pain at the incision site that is not relieved by over-the-counter or prescription medicines.  You have any of these around your incision site or coming from it: ? More redness, swelling, or pain. ? Fluid or blood. ? Warmth to the touch. ? Pus or a bad smell.  You have a fever.  You feel brief, occasional palpitations, light-headedness, or any symptoms that you think might be related to your heart. Get help right away if:  You experience chest pain that is different from the pain at the pacemaker site.  You develop a red streak that extends above or below the incision site.  You experience shortness of breath.  You have palpitations or an irregular heartbeat.  You have light-headedness that does not go away quickly.  You faint or have dizzy spells.  Your pulse suddenly drops or increases rapidly and does not return to normal.  You begin to gain weight and your legs and ankles swell. Summary  After your procedure, it is common to have pain, soreness, and some swelling where the pacemaker was inserted.  Make sure to keep your incision clean and dry. Follow instructions from your health care provider about how to take care of your incision.  Check your incision every day for signs of infection, such as more pain or swelling, pus or a bad smell, warmth, or leaking fluid and blood.  Avoid strenuous exercise and lifting your left arm higher than your shoulder for 2 weeks, or as long as told by your health care provider. This information is not intended to replace advice given to you by  your health care provider. Make sure you discuss any questions you have with your health care provider. Document Released: 04/27/2013 Document Revised: 06/19/2017 Document Reviewed: 05/29/2016 Elsevier Patient Education  [place] procedure care instructions Keep incision clean and dry for 10 days. No driving for 2 days.  You can remove outer dressing tomorrow. Leave steri-strips (little pieces of tape) on until seen in the office for wound check appointment. Call the office [phone]) for redness, drainage, swelling, or fever.   Resume aspirin in 7 days

## 2019-05-04 ENCOUNTER — Encounter (HOSPITAL_COMMUNITY): Payer: Self-pay | Admitting: Internal Medicine

## 2019-05-04 MED FILL — Cefazolin Sodium-Dextrose IV Solution 2 GM/100ML-4%: INTRAVENOUS | Qty: 100 | Status: AC

## 2019-05-04 MED FILL — Gentamicin Sulfate Inj 40 MG/ML: INTRAMUSCULAR | Qty: 80 | Status: AC

## 2019-05-05 ENCOUNTER — Telehealth: Payer: Self-pay | Admitting: Internal Medicine

## 2019-05-05 ENCOUNTER — Encounter: Payer: Self-pay | Admitting: *Deleted

## 2019-05-05 NOTE — Telephone Encounter (Signed)
Error

## 2019-05-13 ENCOUNTER — Other Ambulatory Visit (HOSPITAL_COMMUNITY): Payer: Self-pay | Admitting: Internal Medicine

## 2019-05-17 ENCOUNTER — Other Ambulatory Visit: Payer: Self-pay

## 2019-05-17 ENCOUNTER — Ambulatory Visit (INDEPENDENT_AMBULATORY_CARE_PROVIDER_SITE_OTHER): Payer: BC Managed Care – PPO | Admitting: *Deleted

## 2019-05-17 DIAGNOSIS — I428 Other cardiomyopathies: Secondary | ICD-10-CM

## 2019-05-17 LAB — CUP PACEART INCLINIC DEVICE CHECK
Battery Remaining Longevity: 84 mo
Battery Remaining Percentage: 95 %
Brady Statistic RA Percent Paced: 1 % — CL
Brady Statistic RV Percent Paced: 97 %
Date Time Interrogation Session: 20201027164952
Implantable Lead Implant Date: 20130726
Implantable Lead Implant Date: 20130726
Implantable Lead Implant Date: 20130726
Implantable Lead Location: 753858
Implantable Lead Location: 753859
Implantable Lead Location: 753860
Implantable Pulse Generator Implant Date: 20201013
Lead Channel Pacing Threshold Amplitude: 0.625 V
Lead Channel Pacing Threshold Amplitude: 1.25 V
Lead Channel Pacing Threshold Amplitude: 1.325 V
Lead Channel Pacing Threshold Pulse Width: 0.5 ms
Lead Channel Pacing Threshold Pulse Width: 0.5 ms
Lead Channel Pacing Threshold Pulse Width: 0.5 ms
Lead Channel Sensing Intrinsic Amplitude: 12 mV
Lead Channel Sensing Intrinsic Amplitude: 2 mV
Pulse Gen Serial Number: 111006849

## 2019-05-17 NOTE — Progress Notes (Signed)
CRT-D device check and wound check in office. Steri-strips removed wound edges approximated small amount of edema at wound site that has decreased since implant per patient. Thresholds and sensing consistent with previous device measurements. Lead impedance trends stable over time. No mode switch episodes recorded. No ventricular arrhythmia episodes recorded. Patient bi-ventricularly pacing 97% of the time. Device programmed with appropriate safety margins. Heart failure diagnostics reviewed and trends are stable for patient. Audible alerts demonstrated for patient. No changes made this session. Estimated longevity 7.4 yrs.  Patient enrolled in remote follow up. Plan to check device remotely , next remote 09/09/19. F/U with Dr Rayann Heman 08/08/19. Patient education completed including shock plan.

## 2019-05-17 NOTE — Patient Instructions (Signed)
Call office if you have develop redness, drainage or increased swelling at your incision site.

## 2019-06-03 ENCOUNTER — Other Ambulatory Visit: Payer: Self-pay | Admitting: Student

## 2019-06-03 DIAGNOSIS — I5022 Chronic systolic (congestive) heart failure: Secondary | ICD-10-CM

## 2019-06-10 ENCOUNTER — Other Ambulatory Visit: Payer: Self-pay | Admitting: Student

## 2019-06-10 DIAGNOSIS — I5022 Chronic systolic (congestive) heart failure: Secondary | ICD-10-CM

## 2019-06-29 ENCOUNTER — Other Ambulatory Visit: Payer: Self-pay

## 2019-06-29 ENCOUNTER — Ambulatory Visit (INDEPENDENT_AMBULATORY_CARE_PROVIDER_SITE_OTHER): Payer: BC Managed Care – PPO | Admitting: Student

## 2019-06-29 ENCOUNTER — Ambulatory Visit (HOSPITAL_COMMUNITY): Payer: BC Managed Care – PPO | Attending: Internal Medicine

## 2019-06-29 DIAGNOSIS — I5022 Chronic systolic (congestive) heart failure: Secondary | ICD-10-CM

## 2019-06-29 LAB — CUP PACEART INCLINIC DEVICE CHECK
Date Time Interrogation Session: 20201209124718
Implantable Lead Implant Date: 20130726
Implantable Lead Implant Date: 20130726
Implantable Lead Implant Date: 20130726
Implantable Lead Location: 753858
Implantable Lead Location: 753859
Implantable Lead Location: 753860
Implantable Pulse Generator Implant Date: 20201013
Pulse Gen Serial Number: 111006849

## 2019-06-29 NOTE — Progress Notes (Signed)
    Electrophysiology CRT AV optimization   Date: 06/29/2019  ID:  Louis Christians., DOB 1941-03-01, MRN [id number]  PCP: Curly Rim, MD Primary Cardiologist: Dr. Haroldine Laws  Electrophysiologist: Dr. Rayann Heman  CC: Heart failure despite LV lead placement / CRT optimization  [employer] Jude CRT-D implanted 02/13/2012, gen change 05/03/2019 for CHF with LBBB History of appropriate therapy: No History of AAD therapy: No  LV lead History: Model: St. Jude Quartet 1458Q - 86 cm Threshold 1.25 V @ 0.5 ms  Vector D1-M2 Revisions/CXR: CXR 02/09/2019 with stable positioning Diaphragmatic Stim No VV timing LV -> RV 50 ms  Echocardiogram: Pre-device implant: 01/19/2012 LVEF 15% Post-device implant: 04/12/2013 LVEF 40-45%  EKG: Pre-device implant: 02/02/2012 QRS 188 ms Post-device implant: 05/04/2019 QRS 104 ms  ICD interrogation: CRT pacing: 97% AF: 0% PVC burden: <1*% Thoracic impedence: elevated (dry), tends to go up and down HR excursion: Small amount of elevated rates appear to be related to ectopy. No sustained atrial or ventricular high rates.  See PaceArt report for full details.   EKG:  EKG is not ordered today. Consider VV optimization at later date based on response to AV optimization. Most recent EKG with good VV timing qith QRS 104 ms.  Assessment and Plan:  1.  Chronic systolic heart failure Patient reports overall improvement of symptoms, and EF has improved status post CRT implant. He does still have some SOB with walking up inclines, stairs, or bending over, worse in the past 10-12 months Device interrogation today demonstrates stable thresholds, sensing, and impedences. No episodes.  Intrinsic AV delay ~ 195 ms with truncation of E wave, not much improvement with SAV of 150 ms (programmed on arrival). Trunacation worse at [facility] of 137ms, but improved as SAV decreased down to 110 ms. Slightly worse at 100 ms.  New settings Paced AV delay 160 ms (from 170) and Sensed AV  delay 110 ms (from 150)  Multi-Site pacing diagnostic ran, but did not have sufficient thresholds at alternate vectors to run today. His only two acceptable alternate vectors were D1 - P4 with threshold of 1.0 V @ 0.5 ms and D1- M3 @ 1.5 V @ 0.5 ms.  He was left programmed as is for VV timing with threshold of D1-M2 @ 1.25V @ 0.5 ms with LV -> RV 50 ms  Disposition:   FU with Dr Rayann Heman in 1 month as scheduled for 3 month follow up post gen change.   Jacalyn Lefevre, PA-C  06/29/2019 10:58 AM  [facility] [address] [place] [address] [phone] (office) [phone] (fax

## 2019-08-08 ENCOUNTER — Encounter: Payer: BC Managed Care – PPO | Admitting: Internal Medicine

## 2019-08-10 ENCOUNTER — Encounter: Payer: Self-pay | Admitting: Internal Medicine

## 2019-08-10 ENCOUNTER — Other Ambulatory Visit: Payer: Self-pay

## 2019-08-10 ENCOUNTER — Telehealth (INDEPENDENT_AMBULATORY_CARE_PROVIDER_SITE_OTHER): Payer: BC Managed Care – PPO | Admitting: Internal Medicine

## 2019-08-10 ENCOUNTER — Telehealth: Payer: Self-pay | Admitting: *Deleted

## 2019-08-10 ENCOUNTER — Ambulatory Visit (INDEPENDENT_AMBULATORY_CARE_PROVIDER_SITE_OTHER): Payer: BC Managed Care – PPO | Admitting: *Deleted

## 2019-08-10 VITALS — Ht 69.0 in | Wt 205.0 lb

## 2019-08-10 DIAGNOSIS — I428 Other cardiomyopathies: Secondary | ICD-10-CM

## 2019-08-10 DIAGNOSIS — I110 Hypertensive heart disease with heart failure: Secondary | ICD-10-CM

## 2019-08-10 DIAGNOSIS — I5022 Chronic systolic (congestive) heart failure: Secondary | ICD-10-CM

## 2019-08-10 DIAGNOSIS — I493 Ventricular premature depolarization: Secondary | ICD-10-CM

## 2019-08-10 DIAGNOSIS — I10 Essential (primary) hypertension: Secondary | ICD-10-CM

## 2019-08-10 NOTE — Progress Notes (Signed)
Electrophysiology TeleHealth Note   Due to national recommendations of social distancing due to COVID 19, an audio/video telehealth visit is felt to be most appropriate for this patient at this time.  See MyChart message from today for the patient's consent to telehealth for [facility].   Date:  08/10/2019   ID:  Louis Christians., DOB 18-May-1941, MRN [id number]  Location: patient's home  Provider location:  [facility]  Evaluation Performed: Follow-up visit  PCP:  Corrington, Delsa Grana, MD   Electrophysiologist:  Dr Rayann Heman  Chief Complaint:  palpitations  History of Present Illness:    Louis Bott. is a 79 y.o. male who presents via telehealth conferencing today.  Since his BiV ICD generator change, the patient reports doing very well.  He continues to have stable chronic SOB with activity.  He underwent optimization of his device in December and thinks that he may be "a little better". Today, he denies symptoms of palpitations, chest pain   lower extremity edema, dizziness, presyncope, or syncope.  The patient is otherwise without complaint today.    Past Medical History:  Diagnosis Date  . Automatic implantable cardioverter-defibrillator in situ 02/16/2012  . Cancer ([place])    basel cell on hand  . Chest pain 10/07/2011  . CHF (congestive heart failure) ([place])   . Chronic systolic heart failure ([place])   . DCM (dilated cardiomyopathy) ([place])   . Dyspnea 04/29/2011   Arlyce Harman 04/2011:  No obstruction, +restriction-former smoker  -CT chest 06/26/11 sm. Bilateral pleural effusions,  Question mild subpleural reticulation, indicative of fibrosis.  Coronary artery calcification. -No desaturations with walking 06/24/11     . Erectile dysfunction   . Heart palpitations 04/14/2012  . HTN (hypertension)   . Hyperlipidemia   . 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 Insomnia   . LBBB (left bundle branch block)  07/02/2011  . Mixed hyperlipidemia 07/02/2011  . Nonischemic cardiomyopathy ([place]) 06/30/2011   Echo 06/30/2011 >Left ventricle: LVEF is approximately 10 to 15% with inferior, septal, apical akinesis; hypokinesis elsehwere The cavity size was mildly dilated. Wall thickness was increased in a pattern of mild LVH.- Aortic valve: AV is thckened, calcified with no signifi stenosis.The atrium was severely dilated.: Systolic function was moderately reduced.The atrium was mildly dilated.PA peak pre  . PVC (premature ventricular contraction) 07/02/2011  . Wheezing 04/29/2011   Sinus ct 05/2011:     Past Surgical History:  Procedure Laterality Date  . BI-VENTRICULAR IMPLANTABLE CARDIOVERTER DEFIBRILLATOR N/A 02/13/2012   Procedure: BI-VENTRICULAR IMPLANTABLE CARDIOVERTER DEFIBRILLATOR  (CRT-D);  Surgeon: Thompson Grayer, MD;  Location: [facility] CATH LAB;  Service: Cardiovascular;  Laterality: N/A;  . BIV ICD GENERATOR CHANGEOUT N/A 05/03/2019   Procedure: BIV ICD GENERATOR CHANGEOUT;  Surgeon: Thompson Grayer, MD;  Location: [place] CV LAB;  Service: Cardiovascular;  Laterality: N/A;  . CARDIAC DEFIBRILLATOR PLACEMENT  02/13/2012   SJM Quadra Assura BIV ICD implanted by Dr Rayann Heman  . NASAL SINUS SURGERY  93, 97, 2010, 2006    Current Outpatient Medications  Medication Sig Dispense Refill  . amitriptyline (ELAVIL) 100 MG tablet Take 100 mg by mouth at bedtime.      Marland Kitchen aspirin 81 MG tablet Take 81 mg by mouth daily.      . carvedilol (COREG) 25 MG tablet TAKE 1 TABLET (25 MG TOTAL) BY MOUTH 2 (TWO) TIMES DAILY WITH A MEAL. 180 tablet 3  . colesevelam (WELCHOL) 625 MG  tablet Take 1,875 mg by mouth 2 (two) times daily.     Marland Kitchen ezetimibe (ZETIA) 10 MG tablet Take 10 mg by mouth at bedtime.     . furosemide (LASIX) 20 MG tablet TAKE 1 TABLET BY MOUTH TWICE A DAY 180 tablet 1  . LORazepam (ATIVAN) 1 MG tablet Take 1 mg by mouth 2 (two) times daily.    Marland Kitchen losartan (COZAAR) 100 MG tablet TAKE 1 TABLET BY MOUTH IN THE AM  AND 1/2 TABLET IN THE PM 135 tablet 3  . pravastatin (PRAVACHOL) 80 MG tablet Take 80 mg by mouth at bedtime.     Marland Kitchen spironolactone (ALDACTONE) 25 MG tablet TAKE 1 TABLET BY MOUTH EVERY DAY 90 tablet 1  . zolpidem (AMBIEN) 10 MG tablet Take 10 mg by mouth at bedtime.      No current facility-administered medications for this visit.    Allergies:   Patient has no known allergies.   Social History:  The patient  reports that he quit smoking about 18 years ago. His smoking use included cigarettes. He has a 30.00 pack-year smoking history. He has never used smokeless tobacco. He reports that he does not drink alcohol or use drugs.   Family History:  The patient's family history includes Colon cancer in his father; Melanoma in his mother and sister.   ROS:  Please see the history of present illness.   All other systems are personally reviewed and negative.    Exam:    Vital Signs:  Ht 5\' 9"  (1.753 m)   Wt 205 lb (93 kg)   BMI 30.27 kg/m   Well sounding and appearing, alert and conversant, regular work of breathing,  good skin color Eyes- anicteric, neuro- grossly intact, skin- no apparent rash or lesions or cyanosis, mouth- oral mucosa is pink  Labs/Other Tests and Data Reviewed:    Recent Labs: 02/09/2019: B Natriuretic Peptide 35.0 04/29/2019: BUN 14; Creatinine, Ser 0.92; Hemoglobin 14.9; Platelets 227; Potassium 4.6; Sodium 137   Wt Readings from Last 3 Encounters:  08/10/19 205 lb (93 kg)  05/03/19 207 lb (93.9 kg)  04/04/19 212 lb 3.2 oz (96.3 kg)     Last device remote is reviewed from [facility] PDF which reveals normal device function, no arrhythmias    ASSESSMENT & PLAN:    1.  Nonisschemic CM/ chronic systolic dysfunction Doing well s/p recent CRT-D generator change S/p CRT optimization by Oda Kilts 06/2019. Follows in CHF clinic He has declined follow-up with Sharman Cheek currently, but would be willing to reconsider if his CHF progressed. He may benefit from  entresto.  He sees [name] in a few months, so I will defer ot him.  2. HTN Stable No change required today  3. PVCs Stable No change required today I do not feel that ablation or AAD therapy would be beneficial at this time.   Follow-up:  12 months with me   Patient Risk:  after full review of this patients clinical status, I feel that they are at moderate risk at this time.  Today, I have spent 15 minutes with the patient with telehealth technology discussing arrhythmia management .    Army Fossa, MD  08/10/2019 9:00 AM     [facility] [place] [address] [phone] (office) [phone] (fax)

## 2019-08-10 NOTE — Telephone Encounter (Signed)
Spoke with patient to request manual ICD transmission for review by Dr. Rayann Heman during virtual visit. Assisted pt with sending transmission, completed on his end. Advised will await transmission on our end, will call back if not received. Pt in agreement with plan, no further questions at this time.

## 2019-08-11 NOTE — Telephone Encounter (Signed)
Transmission has not yet been received. Discussed with Gaspar Bidding, [employer] representative--due to [employer], there is a backlog of transmissions and patient's transmission should be available soon. Last app update was on 08/10/19, last DirectAlerts check was on 08/08/19--no alert conditions noted as of that time.

## 2019-08-15 NOTE — Telephone Encounter (Signed)
Transmission received 08/10/19 and 08/11/19. Normal ICD function. Added to schedule for processing.

## 2019-08-16 LAB — CUP PACEART REMOTE DEVICE CHECK
Battery Remaining Longevity: 76 mo
Battery Remaining Percentage: 94 %
Battery Voltage: 2.99 V
Brady Statistic AP VP Percent: 2 %
Brady Statistic AP VS Percent: 1 %
Brady Statistic AS VP Percent: 96 %
Brady Statistic AS VS Percent: 1.3 %
Brady Statistic RA Percent Paced: 1 %
Date Time Interrogation Session: 20210121020029
HighPow Impedance: 80 Ohm
Implantable Lead Implant Date: 20130726
Implantable Lead Implant Date: 20130726
Implantable Lead Implant Date: 20130726
Implantable Lead Location: 753858
Implantable Lead Location: 753859
Implantable Lead Location: 753860
Implantable Pulse Generator Implant Date: 20201013
Lead Channel Impedance Value: 1300 Ohm
Lead Channel Impedance Value: 410 Ohm
Lead Channel Impedance Value: 500 Ohm
Lead Channel Pacing Threshold Amplitude: 0.5 V
Lead Channel Pacing Threshold Amplitude: 1.75 V
Lead Channel Pacing Threshold Amplitude: 1.875 V
Lead Channel Pacing Threshold Pulse Width: 0.5 ms
Lead Channel Pacing Threshold Pulse Width: 0.5 ms
Lead Channel Pacing Threshold Pulse Width: 0.5 ms
Lead Channel Sensing Intrinsic Amplitude: 12 mV
Lead Channel Sensing Intrinsic Amplitude: 2.2 mV
Lead Channel Setting Pacing Amplitude: 1.5 V
Lead Channel Setting Pacing Amplitude: 2.375
Lead Channel Setting Pacing Amplitude: 2.75 V
Lead Channel Setting Pacing Pulse Width: 0.5 ms
Lead Channel Setting Pacing Pulse Width: 0.5 ms
Lead Channel Setting Sensing Sensitivity: 0.5 mV
Pulse Gen Serial Number: 111006849

## 2019-09-30 ENCOUNTER — Telehealth (HOSPITAL_COMMUNITY): Payer: Self-pay

## 2019-09-30 NOTE — Telephone Encounter (Signed)
Received call from patients doctors office at [facility], requesting cardiac clearance on patient pending surgery for rectal cancer. Appt made for 3/15.  LM on patients phone to let him know of appt date and time, requesting his call back

## 2019-10-03 ENCOUNTER — Encounter (HOSPITAL_COMMUNITY): Payer: Self-pay | Admitting: Internal Medicine

## 2019-10-03 ENCOUNTER — Other Ambulatory Visit (HOSPITAL_COMMUNITY): Payer: Self-pay | Admitting: Cardiology

## 2019-10-03 ENCOUNTER — Other Ambulatory Visit: Payer: Self-pay

## 2019-10-03 ENCOUNTER — Ambulatory Visit (HOSPITAL_COMMUNITY)
Admission: RE | Admit: 2019-10-03 | Discharge: 2019-10-03 | Disposition: A | Discharge status: Home / Self Care | Payer: BC Managed Care – PPO | Source: Ambulatory Visit | Attending: Internal Medicine | Admitting: Internal Medicine

## 2019-10-03 VITALS — BP 110/68 | HR 74 | Wt 210.4 lb

## 2019-10-03 DIAGNOSIS — I493 Ventricular premature depolarization: Secondary | ICD-10-CM | POA: Diagnosis not present

## 2019-10-03 DIAGNOSIS — G4700 Insomnia, unspecified: Secondary | ICD-10-CM | POA: Insufficient documentation

## 2019-10-03 DIAGNOSIS — E669 Obesity, unspecified: Secondary | ICD-10-CM | POA: Insufficient documentation

## 2019-10-03 DIAGNOSIS — I5022 Chronic systolic (congestive) heart failure: Secondary | ICD-10-CM

## 2019-10-03 DIAGNOSIS — Z79899 Other long term (current) drug therapy: Secondary | ICD-10-CM | POA: Diagnosis not present

## 2019-10-03 DIAGNOSIS — E782 Mixed hyperlipidemia: Secondary | ICD-10-CM | POA: Insufficient documentation

## 2019-10-03 DIAGNOSIS — Z95810 Presence of automatic (implantable) cardiac defibrillator: Secondary | ICD-10-CM | POA: Diagnosis not present

## 2019-10-03 DIAGNOSIS — I110 Hypertensive heart disease with heart failure: Secondary | ICD-10-CM | POA: Insufficient documentation

## 2019-10-03 DIAGNOSIS — Z01818 Encounter for other preprocedural examination: Secondary | ICD-10-CM | POA: Insufficient documentation

## 2019-10-03 DIAGNOSIS — Z01810 Encounter for preprocedural cardiovascular examination: Secondary | ICD-10-CM

## 2019-10-03 DIAGNOSIS — I10 Essential (primary) hypertension: Secondary | ICD-10-CM

## 2019-10-03 DIAGNOSIS — Z7982 Long term (current) use of aspirin: Secondary | ICD-10-CM | POA: Insufficient documentation

## 2019-10-03 DIAGNOSIS — I2510 Atherosclerotic heart disease of native coronary artery without angina pectoris: Secondary | ICD-10-CM

## 2019-10-03 DIAGNOSIS — I420 Dilated cardiomyopathy: Secondary | ICD-10-CM | POA: Insufficient documentation

## 2019-10-03 NOTE — Telephone Encounter (Signed)
Called patient as reminder for appointment today. LM for patient to return call to confirm.

## 2019-10-03 NOTE — Telephone Encounter (Signed)
Per office staff pt called back, he will be coming to his 12pm appt.

## 2019-10-03 NOTE — Progress Notes (Signed)
ADVANCED HF CLINIC NOTE  Patient ID: Louis Mccauslin., male   DOB: 1941-06-19, 79 y.o.   MRN: [id number]  PCP:  Dr. Serita Grammes Primary Cardiologist:  Dr. Jenkins Rouge (will not go back and see) Primary Electrophysiologist:  Dr. Rayann Heman    History of Present Illness: Louis Venhaus. is a 79 y.o. male with h/o HTN, obesity, previous tobacco use (mild restriction on PFTs, no obstruction) and LBBB.  S/p St Jude CRT-D (7/13 followed by Dr. Rayann Heman)   He developed dyspnea/wheezing in summer of 2012. Referred to Dr. Gwenette Greet. Mild airway disease. Echocardiogram 06/30/11: EF 10-15%, inferior, septal, apical akinesis, mild LVH, severe LAE, mild RAE, moderately reduced RVSF, PASP 52.  Cath by Dr. Burt Knack. Mild non-obs CAD. LM 20-30%, LAD ok, LCx 30-40% RCA 30-40% prox 50% distal. LVEF 10-15%.  CPX test:  4/13: Spirometry: normal Peak VO2: 17.7 ml/kg/min predicted peak VO2: 86.5% (corrected to ideal weight VO2 70ml/kg/min) VE/VCO2 slope: 31.1 OUES: 2.11 Peak RER: 1.20 Ventilatory Threshold: 12.0 % predicted peak VO2: 58.7% VE/MVV: 64.9% PETCO2 at peak: 35 O2pulse: 15 % predicted O2pulse: 100%  Echo 7/13: EF 15%  Echo 12/13: 25-30% Echo 03/2013: EF 40-45% Echo 09/17/16 EF 45-50%   Echo 12/20 EF 50% Personally reviewed   Follow up for Heart Failure:  Says he is doing pretty well. Says this is the least congested he has felt in years. 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 Still with SOB going up steep steps. No edema, CP, orthopnea or PND. Recently found to have localized colon CA and pending resection. Here for pre-op eval.    Labs (10/14): K 4.7, creatinine 0.96  Past Medical History:  Diagnosis Date  . Automatic implantable cardioverter-defibrillator in situ 02/16/2012  . 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 Chest pain 10/07/2011  . CHF (congestive heart failure) ([place])   . Chronic systolic heart failure ([place])   . DCM (dilated cardiomyopathy) ([place])   . Dyspnea  04/29/2011   Arlyce Harman 04/2011:  No obstruction, +restriction-former smoker  -CT chest 06/26/11 sm. Bilateral pleural effusions,  Question mild subpleural reticulation, indicative of fibrosis.  Coronary artery calcification. -No desaturations with walking 06/24/11     . Erectile dysfunction   . Heart palpitations 04/14/2012  . HTN (hypertension)   . Hyperlipidemia   . 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 Insomnia   . LBBB (left bundle branch block) 07/02/2011  . Mixed hyperlipidemia 07/02/2011  . Nonischemic cardiomyopathy ([place]) 06/30/2011   Echo 06/30/2011 >Left ventricle: LVEF is approximately 10 to 15% with inferior, septal, apical akinesis; hypokinesis elsehwere The cavity size was mildly dilated. Wall thickness was increased in a pattern of mild LVH.- Aortic valve: AV is thckened, calcified with no signifi stenosis.The atrium was severely dilated.: Systolic function was moderately reduced.The atrium was mildly dilated.PA peak pre  . PVC (premature ventricular contraction) 07/02/2011  . Wheezing 04/29/2011   Sinus ct 05/2011:     Current Outpatient Medications  Medication Sig Dispense Refill  . albuterol (VENTOLIN HFA) 108 (90 Base) MCG/ACT inhaler INHALE 2 PUFFS INTO THE LUNGS EVERY 6 HOURS    . amitriptyline (ELAVIL) 100 MG tablet Take 100 mg by mouth at bedtime.      Marland Kitchen aspirin 81 MG tablet Take 81 mg by mouth daily.      . carvedilol (COREG) 25 MG tablet TAKE 1 TABLET (25 MG TOTAL) BY MOUTH 2 (TWO) TIMES  DAILY WITH A MEAL. 180 tablet 3  . colesevelam (WELCHOL) 625 MG tablet Take 1,875 mg by mouth 2 (two) times daily.     Marland Kitchen ezetimibe (ZETIA) 10 MG tablet Take 10 mg by mouth at bedtime.     . furosemide (LASIX) 20 MG tablet TAKE 1 TABLET BY MOUTH TWICE A DAY 180 tablet 1  . LORazepam (ATIVAN) 1 MG tablet Take 1 mg by mouth 2 (two) times daily.    Marland Kitchen losartan (COZAAR) 100 MG tablet TAKE 1 TABLET BY MOUTH IN THE AM AND  1/2 TABLET IN THE PM 135 tablet 3  . mirabegron ER (MYRBETRIQ) 50 MG TB24 tablet Take 50 mg by mouth daily.     . Multiple Vitamin (MULTIVITAMIN) capsule Take 1 capsule by mouth daily.     . pravastatin (PRAVACHOL) 80 MG tablet Take 80 mg by mouth at bedtime.     Marland Kitchen spironolactone (ALDACTONE) 25 MG tablet TAKE 1 TABLET BY MOUTH EVERY DAY 90 tablet 1  . zolpidem (AMBIEN) 10 MG tablet Take 10 mg by mouth at bedtime.      No current facility-administered medications for this encounter.    Allergies: No Known Allergies  ROS:  Please see the history of present illness.   All other systems reviewed and negative.   Vitals:   10/03/19 1214  BP: 110/68  Pulse: 74  SpO2: 95%  Weight: 95.4 kg (210 lb 6.4 oz)   PHYSICAL EXAM: General:  Well appearing. No resp difficulty HEENT: normal Neck: supple. no JVD. Carotids 2+ bilat; no bruits. No lymphadenopathy or thryomegaly appreciated. Cor: PMI nondisplaced. Regular rate & rhythm. No rubs, gallops or murmurs. Lungs: clear Abdomen: obese soft, nontender, nondistended. No hepatosplenomegaly. No bruits or masses. Good bowel sounds. Extremities: no cyanosis, clubbing, rash, edema Neuro: alert & orientedx3, cranial nerves grossly intact. moves all 4 extremities w/o difficulty. Affect pleasant  ECG NSR with PACs and v-pacing 79 Personally reviewed  ASSESSMENT AND PLAN:   1. Chronic systolic HF: NICM, EF [id number] (03/2013)  - Echo 09/17/16 EF 45-50%   - s/p St Jude Biv ICD. - EF has normalized with therapy and PVC suppression. Echo 06/29/19 EF 50% Personally reviewed - Stable NYHA II. Volume status ok  - Continue coreg 25 mg BID, losartan 150 mg daily and spironolactone 25 mg daily. - Reinforced the need and importance of daily weights, a low sodium diet, and fluid restriction (less than 2 L a day). Instructed to call the HF clinic if weight increases more than 3 lbs overnight or 5 lbs in a week.  2. Nonobstructive CAD:  - very active - no s/s  ischemia -  Continue ASA, statin and BB.  - Cath 2012: Mild non-obs CAD. LM 20-30%, LAD ok, LCx 30-40% RCA 30-40% prox 50% distal. LVEF 10-15%. 3. HTN:  - Blood pressure well controlled. Continue current regimen. 4. Lipids - Managed by PCP. Goal LDL < 70 5. PVCs - Zio 7/20 2.7% PVCs 6. Pre-op CV examination - EF 50%. No s/s ischemia - Low-risk for CV complications. Ok to proceed without further cardiac testing  Benay Spice 12:47 PM

## 2019-10-03 NOTE — Patient Instructions (Signed)
Please call our office in December to schedule your follow up appointment  If you have any questions or concerns before your next appointment please send [place] a message through [place] or call our office at [phone].  At the [facility], you and your health needs are our priority. As part of our continuing mission to provide you with exceptional heart care, we have created designated Provider Care Teams. These Care Teams include your primary Cardiologist (physician) and Advanced Practice Providers (APPs- Physician Assistants and Nurse Practitioners) who all work together to provide you with the care you need, when you need it.   You may see any of the following providers on your designated Care Team at your next follow up: Marland Kitchen Dr Glori Bickers . Dr Loralie Champagne . Darrick Grinder, NP . Lyda Jester, PA . Audry Riles, PharmD   Please be sure to bring in all your medications bottles to every appointment.

## 2019-11-09 ENCOUNTER — Ambulatory Visit (INDEPENDENT_AMBULATORY_CARE_PROVIDER_SITE_OTHER): Payer: BC Managed Care – PPO | Admitting: *Deleted

## 2019-11-09 DIAGNOSIS — I5022 Chronic systolic (congestive) heart failure: Secondary | ICD-10-CM

## 2019-11-09 LAB — CUP PACEART REMOTE DEVICE CHECK
Battery Remaining Longevity: 83 mo
Battery Remaining Percentage: 91 %
Battery Voltage: 2.98 V
Brady Statistic AP VP Percent: 2.3 %
Brady Statistic AP VS Percent: 1 %
Brady Statistic AS VP Percent: 96 %
Brady Statistic AS VS Percent: 1.1 %
Brady Statistic RA Percent Paced: 1.2 %
Date Time Interrogation Session: 20210421030010
HighPow Impedance: 75 Ohm
Implantable Lead Implant Date: 20130726
Implantable Lead Implant Date: 20130726
Implantable Lead Implant Date: 20130726
Implantable Lead Location: 753858
Implantable Lead Location: 753859
Implantable Lead Location: 753860
Implantable Pulse Generator Implant Date: 20201013
Lead Channel Impedance Value: 1200 Ohm
Lead Channel Impedance Value: 390 Ohm
Lead Channel Impedance Value: 490 Ohm
Lead Channel Pacing Threshold Amplitude: 0.625 V
Lead Channel Pacing Threshold Amplitude: 1.5 V
Lead Channel Pacing Threshold Amplitude: 1.875 V
Lead Channel Pacing Threshold Pulse Width: 0.5 ms
Lead Channel Pacing Threshold Pulse Width: 0.5 ms
Lead Channel Pacing Threshold Pulse Width: 0.5 ms
Lead Channel Sensing Intrinsic Amplitude: 11.5 mV
Lead Channel Sensing Intrinsic Amplitude: 2 mV
Lead Channel Setting Pacing Amplitude: 1.625
Lead Channel Setting Pacing Amplitude: 2.375
Lead Channel Setting Pacing Amplitude: 2.5 V
Lead Channel Setting Pacing Pulse Width: 0.5 ms
Lead Channel Setting Pacing Pulse Width: 0.5 ms
Lead Channel Setting Sensing Sensitivity: 0.5 mV
Pulse Gen Serial Number: 111006849

## 2019-11-09 NOTE — Progress Notes (Signed)
ICD Remote  

## 2020-01-04 ENCOUNTER — Other Ambulatory Visit (HOSPITAL_COMMUNITY): Payer: Self-pay | Admitting: Internal Medicine

## 2020-01-04 MED ORDER — FUROSEMIDE 20 MG PO TABS: 20.0000 mg | ORAL_TABLET | Freq: Two times a day (BID) | ORAL | 1 refills | Status: DC

## 2020-01-04 MED ORDER — SPIRONOLACTONE 25 MG PO TABS: 25.0000 mg | ORAL_TABLET | Freq: Every day | ORAL | 1 refills | Status: DC

## 2020-01-04 NOTE — Addendum Note (Signed)
Addended by: Benson Setting L on: 01/04/2020 01:49 PM   Modules accepted: Orders

## 2020-02-08 ENCOUNTER — Ambulatory Visit (INDEPENDENT_AMBULATORY_CARE_PROVIDER_SITE_OTHER): Payer: BC Managed Care – PPO | Admitting: *Deleted

## 2020-02-08 DIAGNOSIS — I428 Other cardiomyopathies: Secondary | ICD-10-CM | POA: Diagnosis not present

## 2020-02-09 LAB — CUP PACEART REMOTE DEVICE CHECK
Battery Remaining Longevity: 79 mo
Battery Remaining Percentage: 88 %
Battery Voltage: 2.98 V
Brady Statistic AP VP Percent: 1.8 %
Brady Statistic AP VS Percent: 1 %
Brady Statistic AS VP Percent: 96 %
Brady Statistic AS VS Percent: 1 %
Brady Statistic RA Percent Paced: 1 %
Date Time Interrogation Session: 20210721020057
HighPow Impedance: 78 Ohm
Implantable Lead Implant Date: 20130726
Implantable Lead Implant Date: 20130726
Implantable Lead Implant Date: 20130726
Implantable Lead Location: 753858
Implantable Lead Location: 753859
Implantable Lead Location: 753860
Implantable Pulse Generator Implant Date: 20201013
Lead Channel Impedance Value: 1275 Ohm
Lead Channel Impedance Value: 390 Ohm
Lead Channel Impedance Value: 480 Ohm
Lead Channel Pacing Threshold Amplitude: 0.625 V
Lead Channel Pacing Threshold Amplitude: 1.375 V
Lead Channel Pacing Threshold Amplitude: 2.125 V
Lead Channel Pacing Threshold Pulse Width: 0.5 ms
Lead Channel Pacing Threshold Pulse Width: 0.5 ms
Lead Channel Pacing Threshold Pulse Width: 0.5 ms
Lead Channel Sensing Intrinsic Amplitude: 11.5 mV
Lead Channel Sensing Intrinsic Amplitude: 2 mV
Lead Channel Setting Pacing Amplitude: 1.625
Lead Channel Setting Pacing Amplitude: 2.375
Lead Channel Setting Pacing Amplitude: 2.625
Lead Channel Setting Pacing Pulse Width: 0.5 ms
Lead Channel Setting Pacing Pulse Width: 0.5 ms
Lead Channel Setting Sensing Sensitivity: 0.5 mV
Pulse Gen Serial Number: 111006849

## 2020-02-10 NOTE — Progress Notes (Signed)
Remote ICD transmission.   

## 2020-03-07 ENCOUNTER — Other Ambulatory Visit (HOSPITAL_COMMUNITY): Payer: Self-pay | Admitting: Internal Medicine

## 2020-05-09 ENCOUNTER — Ambulatory Visit (INDEPENDENT_AMBULATORY_CARE_PROVIDER_SITE_OTHER): Payer: BC Managed Care – PPO

## 2020-05-09 DIAGNOSIS — I428 Other cardiomyopathies: Secondary | ICD-10-CM

## 2020-05-11 LAB — CUP PACEART REMOTE DEVICE CHECK
Battery Remaining Longevity: 77 mo
Battery Remaining Percentage: 85 %
Battery Voltage: 2.98 V
Brady Statistic AP VP Percent: 1.6 %
Brady Statistic AP VS Percent: 1 %
Brady Statistic AS VP Percent: 97 %
Brady Statistic AS VS Percent: 1 %
Brady Statistic RA Percent Paced: 1 %
Date Time Interrogation Session: 20211020020044
HighPow Impedance: 79 Ohm
Implantable Lead Implant Date: 20130726
Implantable Lead Implant Date: 20130726
Implantable Lead Implant Date: 20130726
Implantable Lead Location: 753858
Implantable Lead Location: 753859
Implantable Lead Location: 753860
Implantable Pulse Generator Implant Date: 20201013
Lead Channel Impedance Value: 1275 Ohm
Lead Channel Impedance Value: 360 Ohm
Lead Channel Impedance Value: 480 Ohm
Lead Channel Pacing Threshold Amplitude: 0.625 V
Lead Channel Pacing Threshold Amplitude: 1.375 V
Lead Channel Pacing Threshold Amplitude: 2.125 V
Lead Channel Pacing Threshold Pulse Width: 0.5 ms
Lead Channel Pacing Threshold Pulse Width: 0.5 ms
Lead Channel Pacing Threshold Pulse Width: 0.5 ms
Lead Channel Sensing Intrinsic Amplitude: 1.6 mV
Lead Channel Sensing Intrinsic Amplitude: 12 mV
Lead Channel Setting Pacing Amplitude: 1.625
Lead Channel Setting Pacing Amplitude: 2.375
Lead Channel Setting Pacing Amplitude: 2.625
Lead Channel Setting Pacing Pulse Width: 0.5 ms
Lead Channel Setting Pacing Pulse Width: 0.5 ms
Lead Channel Setting Sensing Sensitivity: 0.5 mV
Pulse Gen Serial Number: 111006849

## 2020-05-15 NOTE — Progress Notes (Signed)
Remote ICD transmission.   

## 2020-08-15 ENCOUNTER — Ambulatory Visit (INDEPENDENT_AMBULATORY_CARE_PROVIDER_SITE_OTHER): Payer: BC Managed Care – PPO

## 2020-08-15 DIAGNOSIS — I428 Other cardiomyopathies: Secondary | ICD-10-CM

## 2020-08-15 LAB — CUP PACEART REMOTE DEVICE CHECK
Battery Remaining Longevity: 74 mo
Battery Remaining Percentage: 83 %
Battery Voltage: 2.98 V
Brady Statistic AP VP Percent: 1.8 %
Brady Statistic AP VS Percent: 1 %
Brady Statistic AS VP Percent: 97 %
Brady Statistic AS VS Percent: 1 %
Brady Statistic RA Percent Paced: 1.1 %
Date Time Interrogation Session: 20220126020035
HighPow Impedance: 78 Ohm
Implantable Lead Implant Date: 20130726
Implantable Lead Implant Date: 20130726
Implantable Lead Implant Date: 20130726
Implantable Lead Location: 753858
Implantable Lead Location: 753859
Implantable Lead Location: 753860
Implantable Pulse Generator Implant Date: 20201013
Lead Channel Impedance Value: 1250 Ohm
Lead Channel Impedance Value: 360 Ohm
Lead Channel Impedance Value: 490 Ohm
Lead Channel Pacing Threshold Amplitude: 0.625 V
Lead Channel Pacing Threshold Amplitude: 1.375 V
Lead Channel Pacing Threshold Amplitude: 2.125 V
Lead Channel Pacing Threshold Pulse Width: 0.5 ms
Lead Channel Pacing Threshold Pulse Width: 0.5 ms
Lead Channel Pacing Threshold Pulse Width: 0.5 ms
Lead Channel Sensing Intrinsic Amplitude: 1.8 mV
Lead Channel Sensing Intrinsic Amplitude: 11.5 mV
Lead Channel Setting Pacing Amplitude: 1.625
Lead Channel Setting Pacing Amplitude: 2.375
Lead Channel Setting Pacing Amplitude: 2.625
Lead Channel Setting Pacing Pulse Width: 0.5 ms
Lead Channel Setting Pacing Pulse Width: 0.5 ms
Lead Channel Setting Sensing Sensitivity: 0.5 mV
Pulse Gen Serial Number: 111006849

## 2020-08-27 NOTE — Progress Notes (Signed)
Remote ICD transmission.   

## 2020-11-14 ENCOUNTER — Ambulatory Visit (INDEPENDENT_AMBULATORY_CARE_PROVIDER_SITE_OTHER): Payer: BC Managed Care – PPO

## 2020-11-14 DIAGNOSIS — I428 Other cardiomyopathies: Secondary | ICD-10-CM | POA: Diagnosis not present

## 2020-11-14 DIAGNOSIS — I5022 Chronic systolic (congestive) heart failure: Secondary | ICD-10-CM

## 2020-11-14 LAB — CUP PACEART REMOTE DEVICE CHECK
Battery Remaining Longevity: 76 mo
Battery Remaining Percentage: 80 %
Battery Voltage: 2.98 V
Brady Statistic AP VP Percent: 2 %
Brady Statistic AP VS Percent: 1 %
Brady Statistic AS VP Percent: 97 %
Brady Statistic AS VS Percent: 1 %
Brady Statistic RA Percent Paced: 1.3 %
Date Time Interrogation Session: 20220427030147
HighPow Impedance: 79 Ohm
Implantable Lead Implant Date: 20130726
Implantable Lead Implant Date: 20130726
Implantable Lead Implant Date: 20130726
Implantable Lead Location: 753858
Implantable Lead Location: 753859
Implantable Lead Location: 753860
Implantable Pulse Generator Implant Date: 20201013
Lead Channel Impedance Value: 1225 Ohm
Lead Channel Impedance Value: 360 Ohm
Lead Channel Impedance Value: 460 Ohm
Lead Channel Pacing Threshold Amplitude: 0.625 V
Lead Channel Pacing Threshold Amplitude: 1.5 V
Lead Channel Pacing Threshold Amplitude: 1.875 V
Lead Channel Pacing Threshold Pulse Width: 0.5 ms
Lead Channel Pacing Threshold Pulse Width: 0.5 ms
Lead Channel Pacing Threshold Pulse Width: 0.5 ms
Lead Channel Sensing Intrinsic Amplitude: 12 mV
Lead Channel Sensing Intrinsic Amplitude: 2.1 mV
Lead Channel Setting Pacing Amplitude: 1.625
Lead Channel Setting Pacing Amplitude: 2.375
Lead Channel Setting Pacing Amplitude: 2.5 V
Lead Channel Setting Pacing Pulse Width: 0.5 ms
Lead Channel Setting Pacing Pulse Width: 0.5 ms
Lead Channel Setting Sensing Sensitivity: 0.5 mV
Pulse Gen Serial Number: 111006849

## 2020-11-30 NOTE — Progress Notes (Signed)
Remote ICD transmission.   

## 2021-02-13 ENCOUNTER — Ambulatory Visit (INDEPENDENT_AMBULATORY_CARE_PROVIDER_SITE_OTHER): Payer: BC Managed Care – PPO

## 2021-02-13 DIAGNOSIS — I428 Other cardiomyopathies: Secondary | ICD-10-CM | POA: Diagnosis not present

## 2021-02-13 LAB — CUP PACEART REMOTE DEVICE CHECK
Battery Remaining Longevity: 73 mo
Battery Remaining Percentage: 77 %
Battery Voltage: 2.96 V
Brady Statistic AP VP Percent: 2.1 %
Brady Statistic AP VS Percent: 1 %
Brady Statistic AS VP Percent: 97 %
Brady Statistic AS VS Percent: 1 %
Brady Statistic RA Percent Paced: 1.4 %
Date Time Interrogation Session: 20220727020009
HighPow Impedance: 77 Ohm
Implantable Lead Implant Date: 20130726
Implantable Lead Implant Date: 20130726
Implantable Lead Implant Date: 20130726
Implantable Lead Location: 753858
Implantable Lead Location: 753859
Implantable Lead Location: 753860
Implantable Pulse Generator Implant Date: 20201013
Lead Channel Impedance Value: 1250 Ohm
Lead Channel Impedance Value: 360 Ohm
Lead Channel Impedance Value: 430 Ohm
Lead Channel Pacing Threshold Amplitude: 0.625 V
Lead Channel Pacing Threshold Amplitude: 1.125 V
Lead Channel Pacing Threshold Amplitude: 1.625 V
Lead Channel Pacing Threshold Pulse Width: 0.5 ms
Lead Channel Pacing Threshold Pulse Width: 0.5 ms
Lead Channel Pacing Threshold Pulse Width: 0.5 ms
Lead Channel Sensing Intrinsic Amplitude: 1.9 mV
Lead Channel Sensing Intrinsic Amplitude: 12 mV
Lead Channel Setting Pacing Amplitude: 1.625
Lead Channel Setting Pacing Amplitude: 2.125
Lead Channel Setting Pacing Amplitude: 2.125
Lead Channel Setting Pacing Pulse Width: 0.5 ms
Lead Channel Setting Pacing Pulse Width: 0.5 ms
Lead Channel Setting Sensing Sensitivity: 0.5 mV
Pulse Gen Serial Number: 111006849

## 2021-03-08 NOTE — Progress Notes (Signed)
Remote ICD transmission.   

## 2021-04-13 ENCOUNTER — Other Ambulatory Visit (HOSPITAL_COMMUNITY): Payer: Self-pay | Admitting: Internal Medicine

## 2021-05-16 ENCOUNTER — Telehealth: Payer: Self-pay

## 2021-05-16 NOTE — Telephone Encounter (Signed)
Patient called in around 4pm 05/16/2022 and cursed me out because he received a call from [employer] about a disconnected monitor and they told him to call us back. He has the app on his phone and from what he described it seems like the phone might of been reset. I was willing to call merlin on 3 way call to help [place] . Instead the patient was cursing me out, saying inappropriate things about how [facility] is [facility] worst [facility] care [facility] and then some. I explained to the patient im just trying to help and I have truly tried my best and he hung up while I was calling merlin and I was unable to help him. He was not nice at all and seems as if he will be unable to work with.

## 2021-08-14 ENCOUNTER — Ambulatory Visit (INDEPENDENT_AMBULATORY_CARE_PROVIDER_SITE_OTHER): Payer: BC Managed Care – PPO

## 2021-08-14 DIAGNOSIS — I428 Other cardiomyopathies: Secondary | ICD-10-CM | POA: Diagnosis not present

## 2021-08-14 LAB — CUP PACEART REMOTE DEVICE CHECK
Battery Remaining Longevity: 70 mo
Battery Remaining Percentage: 72 %
Battery Voltage: 2.96 V
Brady Statistic AP VP Percent: 3.3 %
Brady Statistic AP VS Percent: 1 %
Brady Statistic AS VP Percent: 95 %
Brady Statistic AS VS Percent: 1 %
Brady Statistic RA Percent Paced: 2 %
Date Time Interrogation Session: 20230125010014
HighPow Impedance: 78 Ohm
Implantable Lead Implant Date: 20130726
Implantable Lead Implant Date: 20130726
Implantable Lead Implant Date: 20130726
Implantable Lead Location: 753858
Implantable Lead Location: 753859
Implantable Lead Location: 753860
Implantable Pulse Generator Implant Date: 20201013
Lead Channel Impedance Value: 1150 Ohm
Lead Channel Impedance Value: 400 Ohm
Lead Channel Impedance Value: 430 Ohm
Lead Channel Pacing Threshold Amplitude: 0.5 V
Lead Channel Pacing Threshold Amplitude: 1.25 V
Lead Channel Pacing Threshold Amplitude: 1.375 V
Lead Channel Pacing Threshold Pulse Width: 0.5 ms
Lead Channel Pacing Threshold Pulse Width: 0.5 ms
Lead Channel Pacing Threshold Pulse Width: 0.5 ms
Lead Channel Sensing Intrinsic Amplitude: 12 mV
Lead Channel Sensing Intrinsic Amplitude: 2.3 mV
Lead Channel Setting Pacing Amplitude: 1.5 V
Lead Channel Setting Pacing Amplitude: 1.875
Lead Channel Setting Pacing Amplitude: 2.25 V
Lead Channel Setting Pacing Pulse Width: 0.5 ms
Lead Channel Setting Pacing Pulse Width: 0.5 ms
Lead Channel Setting Sensing Sensitivity: 0.5 mV
Pulse Gen Serial Number: 111006849

## 2021-08-23 NOTE — Progress Notes (Signed)
Remote ICD transmission.   

## 2021-11-13 ENCOUNTER — Ambulatory Visit (INDEPENDENT_AMBULATORY_CARE_PROVIDER_SITE_OTHER): Payer: BC Managed Care – PPO

## 2021-11-13 DIAGNOSIS — I428 Other cardiomyopathies: Secondary | ICD-10-CM | POA: Diagnosis not present

## 2021-11-13 LAB — CUP PACEART REMOTE DEVICE CHECK
Battery Remaining Longevity: 67 mo
Battery Remaining Percentage: 69 %
Battery Voltage: 2.96 V
Brady Statistic AP VP Percent: 3.5 %
Brady Statistic AP VS Percent: 1 %
Brady Statistic AS VP Percent: 95 %
Brady Statistic AS VS Percent: 1 %
Brady Statistic RA Percent Paced: 2 %
Date Time Interrogation Session: 20230426030030
HighPow Impedance: 84 Ohm
Implantable Lead Implant Date: 20130726
Implantable Lead Implant Date: 20130726
Implantable Lead Implant Date: 20130726
Implantable Lead Location: 753858
Implantable Lead Location: 753859
Implantable Lead Location: 753860
Implantable Pulse Generator Implant Date: 20201013
Lead Channel Impedance Value: 1225 Ohm
Lead Channel Impedance Value: 400 Ohm
Lead Channel Impedance Value: 400 Ohm
Lead Channel Pacing Threshold Amplitude: 0.5 V
Lead Channel Pacing Threshold Amplitude: 1.125 V
Lead Channel Pacing Threshold Amplitude: 1.5 V
Lead Channel Pacing Threshold Pulse Width: 0.5 ms
Lead Channel Pacing Threshold Pulse Width: 0.5 ms
Lead Channel Pacing Threshold Pulse Width: 0.5 ms
Lead Channel Sensing Intrinsic Amplitude: 11.5 mV
Lead Channel Sensing Intrinsic Amplitude: 2 mV
Lead Channel Setting Pacing Amplitude: 1.5 V
Lead Channel Setting Pacing Amplitude: 2 V
Lead Channel Setting Pacing Amplitude: 2.125
Lead Channel Setting Pacing Pulse Width: 0.5 ms
Lead Channel Setting Pacing Pulse Width: 0.5 ms
Lead Channel Setting Sensing Sensitivity: 0.5 mV
Pulse Gen Serial Number: 111006849

## 2021-11-28 NOTE — Progress Notes (Signed)
Remote ICD transmission.   

## 2022-01-29 ENCOUNTER — Encounter: Payer: BC Managed Care – PPO | Admitting: Internal Medicine

## 2022-02-12 ENCOUNTER — Ambulatory Visit (INDEPENDENT_AMBULATORY_CARE_PROVIDER_SITE_OTHER): Payer: BC Managed Care – PPO

## 2022-02-12 DIAGNOSIS — I428 Other cardiomyopathies: Secondary | ICD-10-CM | POA: Diagnosis not present

## 2022-02-12 LAB — CUP PACEART REMOTE DEVICE CHECK
Battery Remaining Longevity: 65 mo
Battery Remaining Percentage: 67 %
Battery Voltage: 2.96 V
Brady Statistic AP VP Percent: 4 %
Brady Statistic AP VS Percent: 1 %
Brady Statistic AS VP Percent: 94 %
Brady Statistic AS VS Percent: 1 %
Brady Statistic RA Percent Paced: 2.1 %
Date Time Interrogation Session: 20230726020014
HighPow Impedance: 74 Ohm
Implantable Lead Implant Date: 20130726
Implantable Lead Implant Date: 20130726
Implantable Lead Implant Date: 20130726
Implantable Lead Location: 753858
Implantable Lead Location: 753859
Implantable Lead Location: 753860
Implantable Pulse Generator Implant Date: 20201013
Lead Channel Impedance Value: 1250 Ohm
Lead Channel Impedance Value: 390 Ohm
Lead Channel Impedance Value: 430 Ohm
Lead Channel Pacing Threshold Amplitude: 0.625 V
Lead Channel Pacing Threshold Amplitude: 1.375 V
Lead Channel Pacing Threshold Amplitude: 1.875 V
Lead Channel Pacing Threshold Pulse Width: 0.5 ms
Lead Channel Pacing Threshold Pulse Width: 0.5 ms
Lead Channel Pacing Threshold Pulse Width: 0.5 ms
Lead Channel Sensing Intrinsic Amplitude: 1.8 mV
Lead Channel Sensing Intrinsic Amplitude: 11.5 mV
Lead Channel Setting Pacing Amplitude: 1.625
Lead Channel Setting Pacing Amplitude: 2.375
Lead Channel Setting Pacing Amplitude: 2.375
Lead Channel Setting Pacing Pulse Width: 0.5 ms
Lead Channel Setting Pacing Pulse Width: 0.5 ms
Lead Channel Setting Sensing Sensitivity: 0.5 mV
Pulse Gen Serial Number: 111006849

## 2022-03-07 NOTE — Progress Notes (Signed)
Remote ICD transmission.   

## 2022-03-31 NOTE — Progress Notes (Unsigned)
Electrophysiology Office Note Date: 04/01/2022  ID:  Louis Christians., DOB [birth year], MRN [id number]  PCP: Curly Rim, MD Primary Cardiologist: None Electrophysiologist: Dr. Rayann Heman -> Dr. Quentin Ore   CC: Routine ICD follow-up  Louis Juarez. is a 81 y.o. male seen today for Dr. Rayann Heman for routine electrophysiology followup.  last being seen in our clinic the patient reports doing well overall. He does have some modest dyspnea with inclines and stairs. Otherwise takes care of his several acres without issue, I.e, planning spreading 20 bales of pine straw today. he denies chest pain, palpitations, PND, orthopnea, nausea, vomiting, dizziness, syncope, edema, weight gain, or early satiety.   He has not had ICD shocks.   Device History: St. 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 Bilateral pleural effusions,  Question mild subpleural reticulation, indicative of fibrosis.  Coronary artery calcification. -No desaturations with walking 06/24/11      Erectile dysfunction    Heart palpitations 04/14/2012   HTN (hypertension)    Hyperlipidemia    Hypertensive cardiovascular disease 07/18/2011   ICD (implantable cardiac defibrillator) in place 02/13/2012   Insomnia    LBBB (left bundle branch block) 07/02/2011   Mixed hyperlipidemia 07/02/2011   Nonischemic cardiomyopathy ([place]) 06/30/2011   Echo 06/30/2011 >Left ventricle: LVEF is approximately 10 to 15% with inferior, septal, apical akinesis;  hypokinesis elsehwere The cavity size was mildly dilated. Wall thickness was increased in a pattern of mild LVH.- Aortic valve: AV is thckened, calcified with no signifi stenosis.The atrium was severely dilated.: Systolic function was moderately reduced.The atrium was mildly dilated.PA peak pre   PVC (premature ventricular contraction) 07/02/2011   Wheezing 04/29/2011   Sinus ct 05/2011:    Past Surgical History:  Procedure Laterality Date   BI-VENTRICULAR IMPLANTABLE CARDIOVERTER DEFIBRILLATOR N/A 02/13/2012   Procedure: BI-VENTRICULAR IMPLANTABLE CARDIOVERTER DEFIBRILLATOR  (CRT-D);  Surgeon: Thompson Grayer, MD;  Location[facility] CATH LAB;  Service: Cardiovascular;  Laterality: N/A;   BIV ICD GENERATOR CHANGEOUT N/A 05/03/2019   Procedure: BIV ICD GENERATOR CHANGEOUT;  Surgeon: Thompson Grayer, MD;  Location: [facility] CV LAB;  Service: Cardiovascular;  Laterality: N/A;   CARDIAC DEFIBRILLATOR PLACEMENT  02/13/2012   [facility] [employer] BIV ICD implanted by Dr Rayann Heman   NASAL SINUS SURGERY  93, 97, 2010, 2006    Current Outpatient Medications  Medication Sig Dispense Refill   albuterol (VENTOLIN HFA) 108 (90 Base) MCG/ACT inhaler INHALE 2 PUFFS INTO THE LUNGS EVERY 6 HOURS     amitriptyline (ELAVIL) 100 MG tablet Take 100 mg by mouth at bedtime.       aspirin 81 MG tablet Take 81 mg by mouth daily.       carvedilol (COREG) 25 MG tablet TAKE 1 TABLET (25 MG TOTAL) BY MOUTH 2 (TWO) TIMES DAILY WITH A MEAL. 180 tablet 3   colesevelam (WELCHOL) 625 MG tablet Take 1,875 mg by mouth 2 (two) times daily.      ezetimibe (ZETIA) 10 MG tablet Take  10 mg by mouth at bedtime.      fluorouracil (EFUDEX) 5 % cream Apply topically 2 (two) times a week.     furosemide (LASIX) 20 MG tablet Take 1 tablet (20 mg total) by mouth 2 (two) times daily. 180 tablet 1   ketoconazole (NIZORAL) 2 % cream Apply topically.     levocetirizine (XYZAL) 5 MG tablet SMARTSIG:1 Tablet(s) By Mouth Every Evening     LORazepam (ATIVAN)  1 MG tablet Take 1 mg by mouth 2 (two) times daily.     losartan (COZAAR) 100 MG tablet TAKE 1 TABLET BY MOUTH IN THE AM AND 1/2 TABLET IN THE PM needs appt 135 tablet 1   mirabegron ER (MYRBETRIQ) 50 MG TB24 tablet Take 50 mg by mouth daily.      Multiple Vitamin (MULTIVITAMIN) capsule Take 1 capsule by mouth daily.      pravastatin (PRAVACHOL) 80 MG tablet Take 80 mg by mouth at bedtime.      spironolactone (ALDACTONE) 25 MG tablet Take 1 tablet (25 mg total) by mouth daily. 90 tablet 1   tamsulosin (FLOMAX) 0.4 MG CAPS capsule Take 0.4 mg by mouth daily.     zolpidem (AMBIEN) 10 MG tablet Take 10 mg by mouth at bedtime.     No current facility-administered medications for this visit.    Allergies:   Patient has no known allergies.   Social History: Social History   Socioeconomic History   Marital status: Married    Spouse name: Not on file   Number of children: Not on file   Years of education: Not on file   Highest education level: Not on file  Occupational History   Occupation: retired  Tobacco Use   Smoking status: Former    Packs/day: 1.50    Years: 20.00    Total pack years: 30.00    Types: Cigarettes    Quit date: 07/21/2001    Years since quitting: 20.7   Smokeless tobacco: Never  Substance and Sexual Activity   Alcohol use: No   Drug use: No   Sexual activity: Not Currently  Other Topics Concern   Not on file  Social History Narrative   Lives in [place].  Retired from the [employer] x 6 years.  He is originally from [place] .  Married.  1 grown son.     Social Determinants of Health   Financial Resource Strain: Not on file  Food Insecurity: Not on file  Transportation Needs: Not on file  Physical Activity: Not on file  Stress: Not on file  Social Connections: Not on file  Intimate Partner Violence: Not on file    Family History: Family History  Problem Relation Age of Onset   Colon cancer Father    Melanoma Mother    Melanoma Sister      Review of Systems: All other systems reviewed and are otherwise negative except as noted above.   Physical Exam: Vitals:   04/01/22 0802  BP: 114/70  Pulse: 67  SpO2: 95%  Weight: 199 lb 3.2 oz (90.4 kg)  Height: '5\' 10"'$  (1.778 m)     GEN- The patient is well appearing, alert and oriented x 3 today.   HEENT: normocephalic, atraumatic; sclera clear, conjunctiva pink; hearing intact; oropharynx clear; neck supple, no JVP Lymph- no cervical lymphadenopathy Lungs- Clear to ausculation bilaterally, normal work of breathing.  No wheezes, rales, rhonchi Heart- Regular  rate and rhythm, no murmurs, rubs or gallops, PMI not laterally  displaced GI- soft, non-tender, non-distended, bowel sounds present, no hepatosplenomegaly Extremities- no clubbing or cyanosis. Trace peripheral edema; DP/PT/radial pulses 2+ bilaterally MS- no significant deformity or atrophy Skin- warm and dry, no rash or lesion; ICD pocket well healed Psych- euthymic mood, full affect Neuro- strength and sensation are intact  ICD interrogation- reviewed in detail today,  See PACEART report  EKG:  EKG is ordered today. Personal review of EKG ordered today shows BiV pacing at 67 bpm  Recent Labs: No results found for requested labs within last 365 days.   Wt Readings from Last 3 Encounters:  04/01/22 199 lb 3.2 oz (90.4 kg)  10/03/19 210 lb 6.4 oz (95.4 kg)  08/10/19 205 lb (93 kg)     Other studies Reviewed: Additional studies/ records that were reviewed today include: Previous EP office notes.   Assessment and Plan:  1.  Chronic systolic dysfunction s/p St. Jude CRT-D  euvolemic today Stable on an appropriate medical regimen Normal ICD function See Pace Art report No changes today  2. PVCs 2-3% by prior monitor Continue current medications  3. HTN Stable on current regimen   Current medicines are reviewed at length with the patient today.    Labs/ tests ordered today include:  Orders Placed  This Encounter  Procedures   EKG 12-Lead   Disposition:   Follow up with Dr. Quentin Ore in 6 months to establish from Dr. Rayann Heman    Signed, Shirley Friar, PA-C  04/01/2022 8:06 AM  [facility] [address] [place] [address] [phone] (office) [phone] (fax)

## 2022-04-01 ENCOUNTER — Encounter: Payer: Self-pay | Admitting: Student

## 2022-04-01 ENCOUNTER — Ambulatory Visit: Payer: BC Managed Care – PPO | Attending: Student | Admitting: Student

## 2022-04-01 VITALS — BP 114/70 | HR 67 | Ht 70.0 in | Wt 199.2 lb

## 2022-04-01 DIAGNOSIS — I428 Other cardiomyopathies: Secondary | ICD-10-CM

## 2022-04-01 DIAGNOSIS — I493 Ventricular premature depolarization: Secondary | ICD-10-CM | POA: Diagnosis not present

## 2022-04-01 DIAGNOSIS — I5022 Chronic systolic (congestive) heart failure: Secondary | ICD-10-CM | POA: Diagnosis not present

## 2022-04-01 LAB — CUP PACEART INCLINIC DEVICE CHECK
Battery Remaining Longevity: 64 mo
Brady Statistic RA Percent Paced: 2.2 %
Brady Statistic RV Percent Paced: 98 %
Date Time Interrogation Session: 20230912091218
HighPow Impedance: 83.25 Ohm
Implantable Lead Implant Date: 20130726
Implantable Lead Implant Date: 20130726
Implantable Lead Implant Date: 20130726
Implantable Lead Location: 753858
Implantable Lead Location: 753859
Implantable Lead Location: 753860
Implantable Pulse Generator Implant Date: 20201013
Lead Channel Impedance Value: 1350 Ohm
Lead Channel Impedance Value: 437.5 Ohm
Lead Channel Impedance Value: 450 Ohm
Lead Channel Pacing Threshold Amplitude: 0.75 V
Lead Channel Pacing Threshold Amplitude: 0.75 V
Lead Channel Pacing Threshold Amplitude: 1.25 V
Lead Channel Pacing Threshold Amplitude: 1.25 V
Lead Channel Pacing Threshold Pulse Width: 0.5 ms
Lead Channel Pacing Threshold Pulse Width: 0.5 ms
Lead Channel Pacing Threshold Pulse Width: 0.5 ms
Lead Channel Pacing Threshold Pulse Width: 0.5 ms
Lead Channel Sensing Intrinsic Amplitude: 1.4 mV
Lead Channel Sensing Intrinsic Amplitude: 11.5 mV
Lead Channel Setting Pacing Amplitude: 1.625
Lead Channel Setting Pacing Amplitude: 1.75 V
Lead Channel Setting Pacing Amplitude: 2.25 V
Lead Channel Setting Pacing Pulse Width: 0.5 ms
Lead Channel Setting Pacing Pulse Width: 0.5 ms
Lead Channel Setting Sensing Sensitivity: 0.5 mV
Pulse Gen Serial Number: 111006849

## 2022-04-01 NOTE — Patient Instructions (Signed)
Medication Instructions:  Your physician recommends that you continue on your current medications as directed. Please refer to the Current Medication list given to you today.  *If you need a refill on your cardiac medications before your next appointment, please call your pharmacy*   Lab Work: None If you have labs (blood work) drawn today and your tests are completely normal, you will receive your results only by: [place] (if you have MyChart) OR A paper copy in the mail If you have any lab test that is abnormal or we need to change your treatment, we will call you to review the results   Follow-Up: At [facility], you and your health needs are our priority.  As part of our continuing mission to provide you with exceptional heart care, we have created designated Provider Care Teams.  These Care Teams include your primary Cardiologist (physician) and Advanced Practice Providers (APPs -  Physician Assistants and Nurse Practitioners) who all work together to provide you with the care you need, when you need it.   Your next appointment:   6 month(s)  The format for your next appointment:   In Person  Provider:   Lars Mage, MD

## 2022-05-14 ENCOUNTER — Ambulatory Visit (INDEPENDENT_AMBULATORY_CARE_PROVIDER_SITE_OTHER): Payer: BC Managed Care – PPO

## 2022-05-14 DIAGNOSIS — I428 Other cardiomyopathies: Secondary | ICD-10-CM | POA: Diagnosis not present

## 2022-05-15 LAB — CUP PACEART REMOTE DEVICE CHECK
Battery Remaining Longevity: 62 mo
Battery Remaining Percentage: 63 %
Battery Voltage: 2.96 V
Brady Statistic AP VP Percent: 8.8 %
Brady Statistic AP VS Percent: 1 %
Brady Statistic AS VP Percent: 86 %
Brady Statistic AS VS Percent: 1 %
Brady Statistic RA Percent Paced: 4.1 %
Date Time Interrogation Session: 20231025020013
HighPow Impedance: 78 Ohm
Implantable Lead Connection Status: 753985
Implantable Lead Connection Status: 753985
Implantable Lead Connection Status: 753985
Implantable Lead Implant Date: 20130726
Implantable Lead Implant Date: 20130726
Implantable Lead Implant Date: 20130726
Implantable Lead Location: 753858
Implantable Lead Location: 753859
Implantable Lead Location: 753860
Implantable Pulse Generator Implant Date: 20201013
Lead Channel Impedance Value: 1250 Ohm
Lead Channel Impedance Value: 400 Ohm
Lead Channel Impedance Value: 440 Ohm
Lead Channel Pacing Threshold Amplitude: 0.625 V
Lead Channel Pacing Threshold Amplitude: 1.25 V
Lead Channel Pacing Threshold Amplitude: 1.5 V
Lead Channel Pacing Threshold Pulse Width: 0.5 ms
Lead Channel Pacing Threshold Pulse Width: 0.5 ms
Lead Channel Pacing Threshold Pulse Width: 0.5 ms
Lead Channel Sensing Intrinsic Amplitude: 1.8 mV
Lead Channel Sensing Intrinsic Amplitude: 12 mV
Lead Channel Setting Pacing Amplitude: 1.625
Lead Channel Setting Pacing Amplitude: 2 V
Lead Channel Setting Pacing Amplitude: 2.25 V
Lead Channel Setting Pacing Pulse Width: 0.5 ms
Lead Channel Setting Pacing Pulse Width: 0.5 ms
Lead Channel Setting Sensing Sensitivity: 0.5 mV
Pulse Gen Serial Number: 111006849
Zone Setting Status: 755011

## 2022-05-27 NOTE — Progress Notes (Signed)
Remote ICD transmission.   

## 2022-08-13 ENCOUNTER — Ambulatory Visit: Payer: BC Managed Care – PPO | Attending: Cardiology

## 2022-08-13 DIAGNOSIS — I428 Other cardiomyopathies: Secondary | ICD-10-CM | POA: Diagnosis not present

## 2022-08-13 LAB — CUP PACEART REMOTE DEVICE CHECK
Battery Remaining Longevity: 59 mo
Battery Remaining Percentage: 61 %
Battery Voltage: 2.96 V
Brady Statistic AP VP Percent: 10 %
Brady Statistic AP VS Percent: 1 %
Brady Statistic AS VP Percent: 83 %
Brady Statistic AS VS Percent: 1.4 %
Brady Statistic RA Percent Paced: 4 %
Date Time Interrogation Session: 20240124010009
HighPow Impedance: 73 Ohm
Implantable Lead Connection Status: 753985
Implantable Lead Connection Status: 753985
Implantable Lead Connection Status: 753985
Implantable Lead Implant Date: 20130726
Implantable Lead Implant Date: 20130726
Implantable Lead Implant Date: 20130726
Implantable Lead Location: 753858
Implantable Lead Location: 753859
Implantable Lead Location: 753860
Implantable Pulse Generator Implant Date: 20201013
Lead Channel Impedance Value: 1200 Ohm
Lead Channel Impedance Value: 390 Ohm
Lead Channel Impedance Value: 400 Ohm
Lead Channel Pacing Threshold Amplitude: 0.625 V
Lead Channel Pacing Threshold Amplitude: 1.125 V
Lead Channel Pacing Threshold Amplitude: 1.25 V
Lead Channel Pacing Threshold Pulse Width: 0.5 ms
Lead Channel Pacing Threshold Pulse Width: 0.5 ms
Lead Channel Pacing Threshold Pulse Width: 0.5 ms
Lead Channel Sensing Intrinsic Amplitude: 12 mV
Lead Channel Sensing Intrinsic Amplitude: 2 mV
Lead Channel Setting Pacing Amplitude: 1.625
Lead Channel Setting Pacing Amplitude: 1.625
Lead Channel Setting Pacing Amplitude: 2.25 V
Lead Channel Setting Pacing Pulse Width: 0.5 ms
Lead Channel Setting Pacing Pulse Width: 0.5 ms
Lead Channel Setting Sensing Sensitivity: 0.5 mV
Pulse Gen Serial Number: 111006849
Zone Setting Status: 755011

## 2022-09-03 NOTE — Progress Notes (Signed)
Remote ICD transmission.   

## 2022-09-05 ENCOUNTER — Encounter: Payer: Self-pay | Admitting: Cardiology

## 2022-09-30 ENCOUNTER — Encounter: Payer: BC Managed Care – PPO | Admitting: Cardiology

## 2022-11-12 ENCOUNTER — Ambulatory Visit: Payer: BC Managed Care – PPO

## 2022-12-16 ENCOUNTER — Ambulatory Visit (INDEPENDENT_AMBULATORY_CARE_PROVIDER_SITE_OTHER): Payer: BC Managed Care – PPO

## 2022-12-16 DIAGNOSIS — I428 Other cardiomyopathies: Secondary | ICD-10-CM

## 2022-12-16 LAB — CUP PACEART REMOTE DEVICE CHECK
Battery Remaining Longevity: 56 mo
Battery Remaining Percentage: 58 %
Battery Voltage: 2.96 V
Brady Statistic AP VP Percent: 7.4 %
Brady Statistic AP VS Percent: 1 %
Brady Statistic AS VP Percent: 88 %
Brady Statistic AS VS Percent: 1 %
Brady Statistic RA Percent Paced: 3.6 %
Date Time Interrogation Session: 20240526083629
HighPow Impedance: 88 Ohm
Implantable Lead Connection Status: 753985
Implantable Lead Connection Status: 753985
Implantable Lead Connection Status: 753985
Implantable Lead Implant Date: 20130726
Implantable Lead Implant Date: 20130726
Implantable Lead Implant Date: 20130726
Implantable Lead Location: 753858
Implantable Lead Location: 753859
Implantable Lead Location: 753860
Implantable Pulse Generator Implant Date: 20201013
Lead Channel Impedance Value: 1300 Ohm
Lead Channel Impedance Value: 390 Ohm
Lead Channel Impedance Value: 460 Ohm
Lead Channel Pacing Threshold Amplitude: 0.625 V
Lead Channel Pacing Threshold Amplitude: 1.25 V
Lead Channel Pacing Threshold Amplitude: 1.25 V
Lead Channel Pacing Threshold Pulse Width: 0.5 ms
Lead Channel Pacing Threshold Pulse Width: 0.5 ms
Lead Channel Pacing Threshold Pulse Width: 0.5 ms
Lead Channel Sensing Intrinsic Amplitude: 12 mV
Lead Channel Sensing Intrinsic Amplitude: 2.1 mV
Lead Channel Setting Pacing Amplitude: 1.625
Lead Channel Setting Pacing Amplitude: 1.75 V
Lead Channel Setting Pacing Amplitude: 2.25 V
Lead Channel Setting Pacing Pulse Width: 0.5 ms
Lead Channel Setting Pacing Pulse Width: 0.5 ms
Lead Channel Setting Sensing Sensitivity: 0.5 mV
Pulse Gen Serial Number: 111006849
Zone Setting Status: 755011

## 2023-01-07 NOTE — Progress Notes (Signed)
Remote ICD transmission.   

## 2023-02-11 ENCOUNTER — Ambulatory Visit: Payer: BC Managed Care – PPO

## 2023-03-17 ENCOUNTER — Ambulatory Visit (INDEPENDENT_AMBULATORY_CARE_PROVIDER_SITE_OTHER): Payer: BC Managed Care – PPO

## 2023-03-17 DIAGNOSIS — I428 Other cardiomyopathies: Secondary | ICD-10-CM

## 2023-03-17 DIAGNOSIS — I5022 Chronic systolic (congestive) heart failure: Secondary | ICD-10-CM

## 2023-03-17 LAB — CUP PACEART REMOTE DEVICE CHECK
Battery Remaining Longevity: 53 mo
Battery Remaining Percentage: 55 %
Battery Voltage: 2.95 V
Brady Statistic AP VP Percent: 7.3 %
Brady Statistic AP VS Percent: 1 %
Brady Statistic AS VP Percent: 89 %
Brady Statistic AS VS Percent: 1 %
Brady Statistic RA Percent Paced: 3.8 %
Date Time Interrogation Session: 20240827142617
HighPow Impedance: 80 Ohm
Implantable Lead Connection Status: 753985
Implantable Lead Connection Status: 753985
Implantable Lead Connection Status: 753985
Implantable Lead Implant Date: 20130726
Implantable Lead Implant Date: 20130726
Implantable Lead Implant Date: 20130726
Implantable Lead Location: 753858
Implantable Lead Location: 753859
Implantable Lead Location: 753860
Implantable Pulse Generator Implant Date: 20201013
Lead Channel Impedance Value: 1250 Ohm
Lead Channel Impedance Value: 400 Ohm
Lead Channel Impedance Value: 410 Ohm
Lead Channel Pacing Threshold Amplitude: 0.625 V
Lead Channel Pacing Threshold Amplitude: 1.25 V
Lead Channel Pacing Threshold Amplitude: 2.25 V
Lead Channel Pacing Threshold Pulse Width: 0.5 ms
Lead Channel Pacing Threshold Pulse Width: 0.5 ms
Lead Channel Pacing Threshold Pulse Width: 0.5 ms
Lead Channel Sensing Intrinsic Amplitude: 1.7 mV
Lead Channel Sensing Intrinsic Amplitude: 11.9 mV
Lead Channel Setting Pacing Amplitude: 1.625
Lead Channel Setting Pacing Amplitude: 2.25 V
Lead Channel Setting Pacing Amplitude: 2.75 V
Lead Channel Setting Pacing Pulse Width: 0.5 ms
Lead Channel Setting Pacing Pulse Width: 0.5 ms
Lead Channel Setting Sensing Sensitivity: 0.5 mV
Pulse Gen Serial Number: 111006849
Zone Setting Status: 755011

## 2023-03-25 NOTE — Progress Notes (Signed)
Remote ICD transmission.   

## 2023-04-24 ENCOUNTER — Inpatient Hospital Stay (HOSPITAL_COMMUNITY): Payer: BC Managed Care – PPO | Admitting: Anesthesiology

## 2023-04-24 ENCOUNTER — Inpatient Hospital Stay (HOSPITAL_COMMUNITY)
Admission: EM | Admit: 2023-04-24 | Discharge: 2023-05-13 | DRG: 957 | Disposition: A | Discharge status: Rehabilitation Facility | Payer: BC Managed Care – PPO

## 2023-04-24 ENCOUNTER — Emergency Department (HOSPITAL_COMMUNITY): Payer: BC Managed Care – PPO

## 2023-04-24 ENCOUNTER — Inpatient Hospital Stay (HOSPITAL_COMMUNITY): Payer: BC Managed Care – PPO

## 2023-04-24 ENCOUNTER — Encounter (HOSPITAL_COMMUNITY): Admission: EM | Disposition: A | Discharge status: Rehabilitation Facility | Payer: Self-pay | Source: Home / Self Care

## 2023-04-24 ENCOUNTER — Encounter: Payer: Self-pay | Admitting: Cardiology

## 2023-04-24 ENCOUNTER — Other Ambulatory Visit: Payer: Self-pay

## 2023-04-24 ENCOUNTER — Encounter (HOSPITAL_COMMUNITY): Payer: Self-pay

## 2023-04-24 ENCOUNTER — Emergency Department (HOSPITAL_COMMUNITY): Admission: EM | Admit: 2023-04-24 | Disposition: A | Discharge status: Home / Self Care | Payer: BC Managed Care – PPO

## 2023-04-24 DIAGNOSIS — S329XXA Fracture of unspecified parts of lumbosacral spine and pelvis, initial encounter for closed fracture: Secondary | ICD-10-CM | POA: Diagnosis present

## 2023-04-24 DIAGNOSIS — E861 Hypovolemia: Secondary | ICD-10-CM

## 2023-04-24 DIAGNOSIS — I428 Other cardiomyopathies: Secondary | ICD-10-CM | POA: Diagnosis present

## 2023-04-24 DIAGNOSIS — K5900 Constipation, unspecified: Secondary | ICD-10-CM | POA: Diagnosis not present

## 2023-04-24 DIAGNOSIS — N400 Enlarged prostate without lower urinary tract symptoms: Secondary | ICD-10-CM | POA: Diagnosis not present

## 2023-04-24 DIAGNOSIS — S0081XA Abrasion of other part of head, initial encounter: Secondary | ICD-10-CM | POA: Diagnosis present

## 2023-04-24 DIAGNOSIS — Z800 Family history of malignant neoplasm of digestive organs: Secondary | ICD-10-CM | POA: Diagnosis not present

## 2023-04-24 DIAGNOSIS — I5020 Unspecified systolic (congestive) heart failure: Secondary | ICD-10-CM | POA: Diagnosis not present

## 2023-04-24 DIAGNOSIS — T1490XA Injury, unspecified, initial encounter: Secondary | ICD-10-CM | POA: Diagnosis not present

## 2023-04-24 DIAGNOSIS — E782 Mixed hyperlipidemia: Secondary | ICD-10-CM | POA: Diagnosis present

## 2023-04-24 DIAGNOSIS — R0989 Other specified symptoms and signs involving the circulatory and respiratory systems: Secondary | ICD-10-CM | POA: Diagnosis not present

## 2023-04-24 DIAGNOSIS — Z79899 Other long term (current) drug therapy: Secondary | ICD-10-CM | POA: Diagnosis not present

## 2023-04-24 DIAGNOSIS — Z85828 Personal history of other malignant neoplasm of skin: Secondary | ICD-10-CM | POA: Diagnosis not present

## 2023-04-24 DIAGNOSIS — S52021B Displaced fracture of olecranon process without intraarticular extension of right ulna, initial encounter for open fracture type I or II: Secondary | ICD-10-CM | POA: Diagnosis present

## 2023-04-24 DIAGNOSIS — N179 Acute kidney failure, unspecified: Secondary | ICD-10-CM | POA: Diagnosis not present

## 2023-04-24 DIAGNOSIS — R578 Other shock: Secondary | ICD-10-CM | POA: Diagnosis present

## 2023-04-24 DIAGNOSIS — S22079A Unspecified fracture of T9-T10 vertebra, initial encounter for closed fracture: Secondary | ICD-10-CM | POA: Diagnosis present

## 2023-04-24 DIAGNOSIS — S32811D Multiple fractures of pelvis with unstable disruption of pelvic ring, subsequent encounter for fracture with routine healing: Secondary | ICD-10-CM | POA: Diagnosis present

## 2023-04-24 DIAGNOSIS — I9589 Other hypotension: Secondary | ICD-10-CM | POA: Diagnosis present

## 2023-04-24 DIAGNOSIS — M79604 Pain in right leg: Secondary | ICD-10-CM | POA: Diagnosis not present

## 2023-04-24 DIAGNOSIS — W11XXXA Fall on and from ladder, initial encounter: Secondary | ICD-10-CM | POA: Diagnosis present

## 2023-04-24 DIAGNOSIS — N5089 Other specified disorders of the male genital organs: Secondary | ICD-10-CM | POA: Diagnosis not present

## 2023-04-24 DIAGNOSIS — D75838 Other thrombocytosis: Secondary | ICD-10-CM | POA: Diagnosis present

## 2023-04-24 DIAGNOSIS — M25571 Pain in right ankle and joints of right foot: Secondary | ICD-10-CM | POA: Diagnosis not present

## 2023-04-24 DIAGNOSIS — S2241XD Multiple fractures of ribs, right side, subsequent encounter for fracture with routine healing: Secondary | ICD-10-CM | POA: Diagnosis not present

## 2023-04-24 DIAGNOSIS — K219 Gastro-esophageal reflux disease without esophagitis: Secondary | ICD-10-CM | POA: Diagnosis present

## 2023-04-24 DIAGNOSIS — R195 Other fecal abnormalities: Secondary | ICD-10-CM | POA: Diagnosis not present

## 2023-04-24 DIAGNOSIS — Z95810 Presence of automatic (implantable) cardiac defibrillator: Secondary | ICD-10-CM | POA: Diagnosis not present

## 2023-04-24 DIAGNOSIS — Z23 Encounter for immunization: Secondary | ICD-10-CM | POA: Diagnosis not present

## 2023-04-24 DIAGNOSIS — I509 Heart failure, unspecified: Secondary | ICD-10-CM | POA: Diagnosis present

## 2023-04-24 DIAGNOSIS — G4700 Insomnia, unspecified: Secondary | ICD-10-CM | POA: Diagnosis not present

## 2023-04-24 DIAGNOSIS — W11XXXD Fall on and from ladder, subsequent encounter: Secondary | ICD-10-CM | POA: Diagnosis present

## 2023-04-24 DIAGNOSIS — S51001D Unspecified open wound of right elbow, subsequent encounter: Secondary | ICD-10-CM | POA: Diagnosis not present

## 2023-04-24 DIAGNOSIS — D72829 Elevated white blood cell count, unspecified: Secondary | ICD-10-CM | POA: Diagnosis not present

## 2023-04-24 DIAGNOSIS — F5101 Primary insomnia: Secondary | ICD-10-CM | POA: Diagnosis not present

## 2023-04-24 DIAGNOSIS — S52021D Displaced fracture of olecranon process without intraarticular extension of right ulna, subsequent encounter for closed fracture with routine healing: Secondary | ICD-10-CM | POA: Diagnosis not present

## 2023-04-24 DIAGNOSIS — E875 Hyperkalemia: Secondary | ICD-10-CM | POA: Diagnosis present

## 2023-04-24 DIAGNOSIS — E119 Type 2 diabetes mellitus without complications: Secondary | ICD-10-CM | POA: Diagnosis present

## 2023-04-24 DIAGNOSIS — S32301A Unspecified fracture of right ilium, initial encounter for closed fracture: Secondary | ICD-10-CM | POA: Diagnosis present

## 2023-04-24 DIAGNOSIS — I420 Dilated cardiomyopathy: Secondary | ICD-10-CM | POA: Diagnosis present

## 2023-04-24 DIAGNOSIS — M009 Pyogenic arthritis, unspecified: Secondary | ICD-10-CM | POA: Diagnosis not present

## 2023-04-24 DIAGNOSIS — I110 Hypertensive heart disease with heart failure: Secondary | ICD-10-CM | POA: Diagnosis present

## 2023-04-24 DIAGNOSIS — G9340 Encephalopathy, unspecified: Secondary | ICD-10-CM | POA: Diagnosis not present

## 2023-04-24 DIAGNOSIS — R319 Hematuria, unspecified: Secondary | ICD-10-CM | POA: Diagnosis not present

## 2023-04-24 DIAGNOSIS — K5901 Slow transit constipation: Secondary | ICD-10-CM | POA: Diagnosis not present

## 2023-04-24 DIAGNOSIS — K529 Noninfective gastroenteritis and colitis, unspecified: Secondary | ICD-10-CM | POA: Diagnosis not present

## 2023-04-24 DIAGNOSIS — S32591A Other specified fracture of right pubis, initial encounter for closed fracture: Principal | ICD-10-CM | POA: Diagnosis present

## 2023-04-24 DIAGNOSIS — F411 Generalized anxiety disorder: Secondary | ICD-10-CM | POA: Diagnosis not present

## 2023-04-24 DIAGNOSIS — T8149XA Infection following a procedure, other surgical site, initial encounter: Secondary | ICD-10-CM | POA: Diagnosis not present

## 2023-04-24 DIAGNOSIS — Z1152 Encounter for screening for COVID-19: Secondary | ICD-10-CM | POA: Diagnosis not present

## 2023-04-24 DIAGNOSIS — R1310 Dysphagia, unspecified: Secondary | ICD-10-CM | POA: Diagnosis not present

## 2023-04-24 DIAGNOSIS — I10 Essential (primary) hypertension: Secondary | ICD-10-CM | POA: Diagnosis not present

## 2023-04-24 DIAGNOSIS — S32810G Multiple fractures of pelvis with stable disruption of pelvic ring, subsequent encounter for fracture with delayed healing: Secondary | ICD-10-CM | POA: Diagnosis not present

## 2023-04-24 DIAGNOSIS — D62 Acute posthemorrhagic anemia: Secondary | ICD-10-CM | POA: Diagnosis not present

## 2023-04-24 DIAGNOSIS — J029 Acute pharyngitis, unspecified: Secondary | ICD-10-CM | POA: Diagnosis not present

## 2023-04-24 DIAGNOSIS — S3210XD Unspecified fracture of sacrum, subsequent encounter for fracture with routine healing: Secondary | ICD-10-CM | POA: Diagnosis not present

## 2023-04-24 DIAGNOSIS — R339 Retention of urine, unspecified: Secondary | ICD-10-CM | POA: Diagnosis not present

## 2023-04-24 DIAGNOSIS — Z87891 Personal history of nicotine dependence: Secondary | ICD-10-CM | POA: Diagnosis not present

## 2023-04-24 DIAGNOSIS — S32810D Multiple fractures of pelvis with stable disruption of pelvic ring, subsequent encounter for fracture with routine healing: Secondary | ICD-10-CM | POA: Diagnosis not present

## 2023-04-24 DIAGNOSIS — W19XXXA Unspecified fall, initial encounter: Principal | ICD-10-CM

## 2023-04-24 DIAGNOSIS — S2241XA Multiple fractures of ribs, right side, initial encounter for closed fracture: Secondary | ICD-10-CM | POA: Diagnosis present

## 2023-04-24 DIAGNOSIS — Z808 Family history of malignant neoplasm of other organs or systems: Secondary | ICD-10-CM | POA: Diagnosis not present

## 2023-04-24 DIAGNOSIS — F329 Major depressive disorder, single episode, unspecified: Secondary | ICD-10-CM | POA: Diagnosis not present

## 2023-04-24 DIAGNOSIS — S59909S Unspecified injury of unspecified elbow, sequela: Secondary | ICD-10-CM | POA: Diagnosis not present

## 2023-04-24 DIAGNOSIS — I5022 Chronic systolic (congestive) heart failure: Secondary | ICD-10-CM | POA: Diagnosis present

## 2023-04-24 DIAGNOSIS — Z7982 Long term (current) use of aspirin: Secondary | ICD-10-CM | POA: Diagnosis not present

## 2023-04-24 HISTORY — PX: ORIF PELVIC FRACTURE WITH PERCUTANEOUS SCREWS: SHX6800

## 2023-04-24 HISTORY — DX: Gastro-esophageal reflux disease without esophagitis: K21.9

## 2023-04-24 HISTORY — DX: Anxiety disorder, unspecified: F41.9

## 2023-04-24 HISTORY — DX: Malignant (primary) neoplasm, unspecified: C80.1

## 2023-04-24 HISTORY — DX: Dyspnea, unspecified: R06.00

## 2023-04-24 HISTORY — DX: Ventricular premature depolarization: I49.3

## 2023-04-24 HISTORY — DX: Wheezing: R06.2

## 2023-04-24 HISTORY — DX: Pneumonia, unspecified organism: J18.9

## 2023-04-24 HISTORY — DX: Presence of automatic (implantable) cardiac defibrillator: Z95.810

## 2023-04-24 HISTORY — DX: Male erectile dysfunction, unspecified: N52.9

## 2023-04-24 HISTORY — DX: Left bundle-branch block, unspecified: I44.7

## 2023-04-24 HISTORY — DX: Mixed hyperlipidemia: E78.2

## 2023-04-24 HISTORY — DX: Heart failure, unspecified: I50.9

## 2023-04-24 HISTORY — DX: Essential (primary) hypertension: I10

## 2023-04-24 HISTORY — DX: Insomnia, unspecified: G47.00

## 2023-04-24 HISTORY — DX: Chest pain, unspecified: R07.9

## 2023-04-24 HISTORY — DX: Palpitations: R00.2

## 2023-04-24 HISTORY — DX: Type 2 diabetes mellitus without complications: E11.9

## 2023-04-24 HISTORY — DX: Other cardiomyopathies: I42.8

## 2023-04-24 HISTORY — PX: IRRIGATION AND DEBRIDEMENT ELBOW: SHX6886

## 2023-04-24 LAB — URINALYSIS, ROUTINE W REFLEX MICROSCOPIC
Bilirubin Urine: NEGATIVE
Glucose, UA: NEGATIVE mg/dL
Hgb urine dipstick: NEGATIVE
Ketones, ur: NEGATIVE mg/dL
Leukocytes,Ua: NEGATIVE
Nitrite: NEGATIVE
Protein, ur: NEGATIVE mg/dL
Specific Gravity, Urine: 1.012 (ref 1.005–1.030)
pH: 5 (ref 5.0–8.0)

## 2023-04-24 LAB — CBC
HCT: 34.3 % — ABNORMAL LOW (ref 39.0–52.0)
Hemoglobin: 11.2 g/dL — ABNORMAL LOW (ref 13.0–17.0)
MCH: 30.3 pg (ref 26.0–34.0)
MCHC: 32.7 g/dL (ref 30.0–36.0)
MCV: 92.7 fL (ref 80.0–100.0)
Platelets: 170 10*3/uL (ref 150–400)
RBC: 3.7 MIL/uL — ABNORMAL LOW (ref 4.22–5.81)
RDW: 14 % (ref 11.5–15.5)
WBC: 17.1 10*3/uL — ABNORMAL HIGH (ref 4.0–10.5)
nRBC: 0.1 % (ref 0.0–0.2)

## 2023-04-24 LAB — I-STAT CHEM 8, ED
BUN: 23 mg/dL (ref 8–23)
Calcium, Ion: 0.96 mmol/L — ABNORMAL LOW (ref 1.15–1.40)
Chloride: 100 mmol/L (ref 98–111)
Creatinine, Ser: 1.4 mg/dL — ABNORMAL HIGH (ref 0.61–1.24)
Glucose, Bld: 175 mg/dL — ABNORMAL HIGH (ref 70–99)
HCT: 34 % — ABNORMAL LOW (ref 39.0–52.0)
Hemoglobin: 11.6 g/dL — ABNORMAL LOW (ref 13.0–17.0)
Potassium: 4.6 mmol/L (ref 3.5–5.1)
Sodium: 138 mmol/L (ref 135–145)
TCO2: 26 mmol/L (ref 22–32)

## 2023-04-24 LAB — LACTIC ACID, PLASMA: Lactic Acid, Venous: 2 mmol/L (ref 0.5–1.9)

## 2023-04-24 LAB — I-STAT CG4 LACTIC ACID, ED: Lactic Acid, Venous: 2.3 mmol/L (ref 0.5–1.9)

## 2023-04-24 LAB — TRAUMA TEG PANEL
CFF Max Amplitude: 26.1 mm (ref 15–32)
Citrated Kaolin (R): 4.7 min (ref 4.6–9.1)
Citrated Rapid TEG (MA): 62.9 mm (ref 52–70)
Lysis at 30 Minutes: 0 % (ref 0.0–2.6)

## 2023-04-24 LAB — COMPREHENSIVE METABOLIC PANEL
ALT: 58 U/L — ABNORMAL HIGH (ref 0–44)
AST: 75 U/L — ABNORMAL HIGH (ref 15–41)
Albumin: 3 g/dL — ABNORMAL LOW (ref 3.5–5.0)
Alkaline Phosphatase: 91 U/L (ref 38–126)
Anion gap: 12 (ref 5–15)
BUN: 18 mg/dL (ref 8–23)
CO2: 24 mmol/L (ref 22–32)
Calcium: 7.9 mg/dL — ABNORMAL LOW (ref 8.9–10.3)
Chloride: 101 mmol/L (ref 98–111)
Creatinine, Ser: 1.42 mg/dL — ABNORMAL HIGH (ref 0.61–1.24)
GFR, Estimated: 49 mL/min — ABNORMAL LOW (ref >60)
Glucose, Bld: 179 mg/dL — ABNORMAL HIGH (ref 70–99)
Potassium: 4.5 mmol/L (ref 3.5–5.1)
Sodium: 137 mmol/L (ref 135–145)
Total Bilirubin: 0.8 mg/dL (ref 0.3–1.2)
Total Protein: 6.5 g/dL (ref 6.5–8.1)

## 2023-04-24 LAB — HEMOGLOBIN AND HEMATOCRIT, BLOOD
HCT: 31.5 % — ABNORMAL LOW (ref 39.0–52.0)
Hemoglobin: 10.7 g/dL — ABNORMAL LOW (ref 13.0–17.0)

## 2023-04-24 LAB — ETHANOL: Alcohol, Ethyl (B): <10 mg/dL (ref <10)

## 2023-04-24 LAB — PROTIME-INR
INR: 1.3 — ABNORMAL HIGH (ref 0.8–1.2)
Prothrombin Time: 16.4 s — ABNORMAL HIGH (ref 11.4–15.2)

## 2023-04-24 LAB — GLUCOSE, CAPILLARY: Glucose-Capillary: 161 mg/dL — ABNORMAL HIGH (ref 70–99)

## 2023-04-24 LAB — MASSIVE TRANSFUSION PROTOCOL ORDER (BLOOD BANK NOTIFICATION)

## 2023-04-24 LAB — ABO/RH: ABO/RH(D): O POS

## 2023-04-24 LAB — MRSA NEXT GEN BY PCR, NASAL: MRSA by PCR Next Gen: NOT DETECTED

## 2023-04-24 SURGERY — CLOSED REDUCTION, PELVIS, WITH PERCUTANEOUS FIXATION
Anesthesia: General | Site: Elbow | Laterality: Right

## 2023-04-24 MED ORDER — ALBUMIN HUMAN 5 % IV SOLN
INTRAVENOUS | Status: DC | PRN
Start: 2023-04-24 — End: 2023-04-24

## 2023-04-24 MED ORDER — FENTANYL CITRATE PF 50 MCG/ML IJ SOSY
PREFILLED_SYRINGE | INTRAMUSCULAR | Status: AC | PRN
Start: 2023-04-24 — End: 2023-04-24
  Administered 2023-04-24 (×3): 25 ug via INTRAVENOUS

## 2023-04-24 MED ORDER — VANCOMYCIN HCL 1000 MG IV SOLR
INTRAVENOUS | Status: DC | PRN
Start: 2023-04-24 — End: 2023-04-24
  Administered 2023-04-24: 1000 mg

## 2023-04-24 MED ORDER — METHOCARBAMOL 500 MG PO TABS
1000.0000 mg | ORAL_TABLET | Freq: Three times a day (TID) | ORAL | Status: DC
  Administered 2023-04-25 – 2023-05-13 (×53): 1000 mg via ORAL
  Filled 2023-04-24 (×55): qty 2

## 2023-04-24 MED ORDER — ONDANSETRON HCL 4 MG/2ML IJ SOLN: 4.0000 mg | Freq: Once | INTRAMUSCULAR | Status: DC | PRN

## 2023-04-24 MED ORDER — PHENYLEPHRINE 80 MCG/ML (10ML) SYRINGE FOR IV PUSH (FOR BLOOD PRESSURE SUPPORT)
PREFILLED_SYRINGE | INTRAVENOUS | Status: DC | PRN
  Administered 2023-04-24: 160 ug via INTRAVENOUS

## 2023-04-24 MED ORDER — LACTATED RINGERS IV SOLN: INTRAVENOUS | Status: DC

## 2023-04-24 MED ORDER — METOCLOPRAMIDE HCL 5 MG/ML IJ SOLN: 5.0000 mg | Freq: Three times a day (TID) | INTRAMUSCULAR | Status: DC | PRN

## 2023-04-24 MED ORDER — 0.9 % SODIUM CHLORIDE (POUR BTL) OPTIME
TOPICAL | Status: DC | PRN
  Administered 2023-04-24: 1000 mL

## 2023-04-24 MED ORDER — ROCURONIUM BROMIDE 10 MG/ML (PF) SYRINGE
PREFILLED_SYRINGE | INTRAVENOUS | Status: AC
  Filled 2023-04-24: qty 10

## 2023-04-24 MED ORDER — FENTANYL CITRATE PF 50 MCG/ML IJ SOSY
PREFILLED_SYRINGE | INTRAMUSCULAR | Status: AC
  Filled 2023-04-24: qty 1

## 2023-04-24 MED ORDER — ACETAMINOPHEN 10 MG/ML IV SOLN: 1000.0000 mg | Freq: Once | INTRAVENOUS | Status: DC | PRN

## 2023-04-24 MED ORDER — MORPHINE SULFATE (PF) 2 MG/ML IV SOLN
2.0000 mg | INTRAVENOUS | Status: DC | PRN
  Administered 2023-04-25 (×2): 2 mg via INTRAVENOUS
  Filled 2023-04-24 (×2): qty 1

## 2023-04-24 MED ORDER — AMISULPRIDE (ANTIEMETIC) 5 MG/2ML IV SOLN: 10.0000 mg | Freq: Once | INTRAVENOUS | Status: DC | PRN

## 2023-04-24 MED ORDER — TRANEXAMIC ACID-NACL 1000-0.7 MG/100ML-% IV SOLN: 1000.0000 mg | Freq: Once | INTRAVENOUS | Status: DC

## 2023-04-24 MED ORDER — FENTANYL CITRATE PF 50 MCG/ML IJ SOSY
25.0000 ug | PREFILLED_SYRINGE | Freq: Once | INTRAMUSCULAR | Status: AC
  Administered 2023-04-24: 25 ug via INTRAVENOUS
  Filled 2023-04-24: qty 1

## 2023-04-24 MED ORDER — POLYETHYLENE GLYCOL 3350 17 G PO PACK: 17.0000 g | PACK | Freq: Every day | ORAL | Status: DC | PRN

## 2023-04-24 MED ORDER — SUGAMMADEX SODIUM 200 MG/2ML IV SOLN
INTRAVENOUS | Status: DC | PRN
  Administered 2023-04-24: 200 mg via INTRAVENOUS

## 2023-04-24 MED ORDER — OXYCODONE HCL 5 MG PO TABS
2.5000 mg | ORAL_TABLET | ORAL | Status: DC | PRN
  Administered 2023-04-24: 5 mg via ORAL
  Filled 2023-04-24: qty 1

## 2023-04-24 MED ORDER — PROPOFOL 10 MG/ML IV BOLUS
INTRAVENOUS | Status: AC
  Filled 2023-04-24: qty 20

## 2023-04-24 MED ORDER — ONDANSETRON 4 MG PO TBDP: 4.0000 mg | ORAL_TABLET | Freq: Four times a day (QID) | ORAL | Status: DC | PRN

## 2023-04-24 MED ORDER — FENTANYL CITRATE (PF) 250 MCG/5ML IJ SOLN
INTRAMUSCULAR | Status: AC
  Filled 2023-04-24: qty 5

## 2023-04-24 MED ORDER — PROPOFOL 10 MG/ML IV BOLUS
INTRAVENOUS | Status: DC | PRN
  Administered 2023-04-24: 20 mg via INTRAVENOUS
  Administered 2023-04-24: 50 mg via INTRAVENOUS

## 2023-04-24 MED ORDER — METOCLOPRAMIDE HCL 5 MG PO TABS: 5.0000 mg | ORAL_TABLET | Freq: Three times a day (TID) | ORAL | Status: DC | PRN

## 2023-04-24 MED ORDER — PHENYLEPHRINE HCL-NACL 20-0.9 MG/250ML-% IV SOLN
INTRAVENOUS | Status: DC | PRN
  Administered 2023-04-24: 25 ug/min via INTRAVENOUS
  Administered 2023-04-24 (×2): 100 ug via INTRAVENOUS

## 2023-04-24 MED ORDER — TRANEXAMIC ACID-NACL 1000-0.7 MG/100ML-% IV SOLN
1000.0000 mg | Freq: Once | INTRAVENOUS | Status: AC
  Administered 2023-04-24: 1000 mg via INTRAVENOUS

## 2023-04-24 MED ORDER — IOHEXOL 350 MG/ML SOLN
75.0000 mL | Freq: Once | INTRAVENOUS | Status: AC | PRN
  Administered 2023-04-24: 75 mL via INTRAVENOUS

## 2023-04-24 MED ORDER — SODIUM CHLORIDE 0.9% IV SOLUTION: Freq: Once | INTRAVENOUS | Status: DC

## 2023-04-24 MED ORDER — ACETAMINOPHEN 500 MG PO TABS
1000.0000 mg | ORAL_TABLET | Freq: Four times a day (QID) | ORAL | Status: DC
  Administered 2023-04-25 – 2023-05-13 (×58): 1000 mg via ORAL
  Filled 2023-04-24 (×61): qty 2

## 2023-04-24 MED ORDER — VANCOMYCIN HCL 1000 MG IV SOLR
INTRAVENOUS | Status: AC
  Filled 2023-04-24: qty 20

## 2023-04-24 MED ORDER — DOCUSATE SODIUM 100 MG PO CAPS
100.0000 mg | ORAL_CAPSULE | Freq: Two times a day (BID) | ORAL | Status: DC
  Administered 2023-04-25 – 2023-05-10 (×26): 100 mg via ORAL
  Filled 2023-04-24 (×28): qty 1

## 2023-04-24 MED ORDER — CEFAZOLIN SODIUM-DEXTROSE 2-4 GM/100ML-% IV SOLN
2.0000 g | Freq: Once | INTRAVENOUS | Status: AC
  Administered 2023-04-24: 2 g via INTRAVENOUS

## 2023-04-24 MED ORDER — FENTANYL CITRATE (PF) 250 MCG/5ML IJ SOLN
INTRAMUSCULAR | Status: DC | PRN
  Administered 2023-04-24 (×3): 50 ug via INTRAVENOUS

## 2023-04-24 MED ORDER — ORAL CARE MOUTH RINSE: 15.0000 mL | Freq: Once | OROMUCOSAL | Status: AC

## 2023-04-24 MED ORDER — CHLORHEXIDINE GLUCONATE CLOTH 2 % EX PADS
6.0000 | MEDICATED_PAD | Freq: Every day | CUTANEOUS | Status: DC
  Administered 2023-04-24 – 2023-04-30 (×7): 6 via TOPICAL

## 2023-04-24 MED ORDER — ROCURONIUM BROMIDE 10 MG/ML (PF) SYRINGE
PREFILLED_SYRINGE | INTRAVENOUS | Status: DC | PRN
  Administered 2023-04-24: 20 mg via INTRAVENOUS
  Administered 2023-04-24: 40 mg via INTRAVENOUS

## 2023-04-24 MED ORDER — CEFAZOLIN SODIUM 1 G IJ SOLR
INTRAMUSCULAR | Status: AC
  Filled 2023-04-24: qty 20

## 2023-04-24 MED ORDER — HYDRALAZINE HCL 20 MG/ML IJ SOLN: 10.0000 mg | INTRAMUSCULAR | Status: DC | PRN

## 2023-04-24 MED ORDER — METHOCARBAMOL 500 MG PO TABS
500.0000 mg | ORAL_TABLET | Freq: Three times a day (TID) | ORAL | Status: DC
  Administered 2023-04-24: 500 mg via ORAL
  Filled 2023-04-24: qty 1

## 2023-04-24 MED ORDER — CEFAZOLIN SODIUM-DEXTROSE 2-3 GM-%(50ML) IV SOLR
INTRAVENOUS | Status: DC | PRN
  Administered 2023-04-24: 2 g via INTRAVENOUS

## 2023-04-24 MED ORDER — FENTANYL CITRATE (PF) 100 MCG/2ML IJ SOLN: 25.0000 ug | INTRAMUSCULAR | Status: DC | PRN

## 2023-04-24 MED ORDER — CHLORHEXIDINE GLUCONATE 0.12 % MT SOLN
OROMUCOSAL | Status: AC
  Administered 2023-04-24: 15 mL via OROMUCOSAL
  Filled 2023-04-24: qty 15

## 2023-04-24 MED ORDER — ORAL CARE MOUTH RINSE: 15.0000 mL | OROMUCOSAL | Status: DC | PRN

## 2023-04-24 MED ORDER — METHOCARBAMOL 1000 MG/10ML IJ SOLN
500.0000 mg | Freq: Three times a day (TID) | INTRAVENOUS | Status: DC
  Filled 2023-04-24 (×7): qty 5

## 2023-04-24 MED ORDER — CHLORHEXIDINE GLUCONATE 0.12 % MT SOLN: 15.0000 mL | Freq: Once | OROMUCOSAL | Status: AC

## 2023-04-24 MED ORDER — METOPROLOL TARTRATE 5 MG/5ML IV SOLN
5.0000 mg | Freq: Four times a day (QID) | INTRAVENOUS | Status: DC | PRN
  Filled 2023-04-24: qty 5

## 2023-04-24 MED ORDER — ONDANSETRON HCL 4 MG/2ML IJ SOLN: 4.0000 mg | Freq: Four times a day (QID) | INTRAMUSCULAR | Status: DC | PRN

## 2023-04-24 MED ORDER — ONDANSETRON HCL 4 MG/2ML IJ SOLN
INTRAMUSCULAR | Status: AC
  Filled 2023-04-24: qty 2

## 2023-04-24 MED ORDER — TETANUS-DIPHTH-ACELL PERTUSSIS 5-2.5-18.5 LF-MCG/0.5 IM SUSY
0.5000 mL | PREFILLED_SYRINGE | Freq: Once | INTRAMUSCULAR | Status: AC
  Administered 2023-04-24: 0.5 mL via INTRAMUSCULAR

## 2023-04-24 MED ORDER — IOHEXOL 350 MG/ML SOLN
50.0000 mL | Freq: Once | INTRAVENOUS | Status: AC | PRN
  Administered 2023-04-24: 50 mL

## 2023-04-24 MED ORDER — CALCIUM GLUCONATE-NACL 1-0.675 GM/50ML-% IV SOLN
1.0000 g | Freq: Once | INTRAVENOUS | Status: AC
  Administered 2023-04-24: 1000 mg via INTRAVENOUS

## 2023-04-24 MED ORDER — CEFAZOLIN SODIUM-DEXTROSE 2-4 GM/100ML-% IV SOLN
2.0000 g | Freq: Three times a day (TID) | INTRAVENOUS | Status: AC
  Administered 2023-04-24 – 2023-04-25 (×3): 2 g via INTRAVENOUS
  Filled 2023-04-24 (×3): qty 100

## 2023-04-24 MED ORDER — LIDOCAINE 2% (20 MG/ML) 5 ML SYRINGE
INTRAMUSCULAR | Status: DC | PRN
  Administered 2023-04-24: 60 mg via INTRAVENOUS

## 2023-04-24 MED ORDER — ONDANSETRON HCL 4 MG/2ML IJ SOLN
INTRAMUSCULAR | Status: DC | PRN
  Administered 2023-04-24: 4 mg via INTRAVENOUS

## 2023-04-24 MED ORDER — TRANEXAMIC ACID 1000 MG/10ML IV SOLN
1000.0000 mg | Freq: Once | INTRAVENOUS | Status: AC
  Administered 2023-04-24: 1000 mg via INTRAVENOUS
  Filled 2023-04-24: qty 10

## 2023-04-24 MED ORDER — METRONIDAZOLE 500 MG/100ML IV SOLN
500.0000 mg | Freq: Once | INTRAVENOUS | Status: AC
  Administered 2023-04-24: 500 mg via INTRAVENOUS

## 2023-04-24 MED ORDER — OXYCODONE HCL 5 MG PO TABS
5.0000 mg | ORAL_TABLET | ORAL | Status: DC | PRN
  Administered 2023-04-25 – 2023-04-27 (×9): 10 mg via ORAL
  Filled 2023-04-24 (×10): qty 2

## 2023-04-24 MED ORDER — DOCUSATE SODIUM 100 MG PO CAPS: 100.0000 mg | ORAL_CAPSULE | Freq: Two times a day (BID) | ORAL | Status: DC

## 2023-04-24 MED ORDER — LIDOCAINE 2% (20 MG/ML) 5 ML SYRINGE
INTRAMUSCULAR | Status: AC
  Filled 2023-04-24: qty 5

## 2023-04-24 SURGICAL SUPPLY — 56 items
ADH SKN CLS APL DERMABOND .7 (GAUZE/BANDAGES/DRESSINGS) ×2
APL PRP STRL LF DISP 70% ISPRP (MISCELLANEOUS)
BAG COUNTER SPONGE SURGICOUNT (BAG) ×2 IMPLANT
BAG SPNG CNTER NS LX DISP (BAG) ×2
BIT DRILL CANN 4.5MM (BIT) IMPLANT
BLADE CLIPPER SURG (BLADE) IMPLANT
BLADE SURG 11 STRL SS (BLADE) ×2 IMPLANT
BNDG CMPR 5X4 KNIT ELC UNQ LF (GAUZE/BANDAGES/DRESSINGS) ×2
BNDG ELASTIC 4INX 5YD STR LF (GAUZE/BANDAGES/DRESSINGS) IMPLANT
CHLORAPREP W/TINT 26 (MISCELLANEOUS) ×2 IMPLANT
Cannulated screw 7.5mmX180mm ×2 IMPLANT
DERMABOND ADVANCED .7 DNX12 (GAUZE/BANDAGES/DRESSINGS) IMPLANT
DRAPE C-ARM 42X72 X-RAY (DRAPES) ×2 IMPLANT
DRAPE C-ARMOR (DRAPES) ×2 IMPLANT
DRAPE HALF SHEET 40X57 (DRAPES) ×2 IMPLANT
DRAPE INCISE IOBAN 66X45 STRL (DRAPES) ×2 IMPLANT
DRAPE SURG 17X23 STRL (DRAPES) ×12 IMPLANT
DRAPE U-SHAPE 47X51 STRL (DRAPES) ×2 IMPLANT
DRESSING MEPILEX FLEX 4X4 (GAUZE/BANDAGES/DRESSINGS) IMPLANT
DRSG MEPILEX FLEX 4X4 (GAUZE/BANDAGES/DRESSINGS)
DRSG MEPILEX POST OP 4X8 (GAUZE/BANDAGES/DRESSINGS) IMPLANT
DRSG MEPITEL 4X7.2 (GAUZE/BANDAGES/DRESSINGS) IMPLANT
DRSG TEGADERM 4X4.75 (GAUZE/BANDAGES/DRESSINGS) IMPLANT
ELECT REM PT RETURN 9FT ADLT (ELECTROSURGICAL)
ELECTRODE REM PT RTRN 9FT ADLT (ELECTROSURGICAL) ×2 IMPLANT
GAUZE SPONGE 2X2 STRL 8-PLY (GAUZE/BANDAGES/DRESSINGS) IMPLANT
GAUZE SPONGE 4X4 12PLY STRL (GAUZE/BANDAGES/DRESSINGS) IMPLANT
GLOVE BIO SURGEON STRL SZ 6.5 (GLOVE) ×6 IMPLANT
GLOVE BIO SURGEON STRL SZ7.5 (GLOVE) ×8 IMPLANT
GLOVE BIOGEL PI IND STRL 6.5 (GLOVE) ×2 IMPLANT
GLOVE BIOGEL PI IND STRL 7.5 (GLOVE) ×2 IMPLANT
GOWN STRL REUS W/ TWL LRG LVL3 (GOWN DISPOSABLE) ×4 IMPLANT
GOWN STRL REUS W/TWL LRG LVL3 (GOWN DISPOSABLE) ×4
GUIDEWIRE 2.0MM (WIRE) ×4 IMPLANT
GUIDEWIRE 2.8 THREAD 450 L ST (WIRE) IMPLANT
KIT BASIN OR (CUSTOM PROCEDURE TRAY) ×2 IMPLANT
KIT TURNOVER KIT B (KITS) ×2 IMPLANT
MANIFOLD NEPTUNE II (INSTRUMENTS) ×2 IMPLANT
NS IRRIG 1000ML POUR BTL (IV SOLUTION) ×2 IMPLANT
PACK TOTAL JOINT (CUSTOM PROCEDURE TRAY) ×2 IMPLANT
PACK UNIVERSAL I (CUSTOM PROCEDURE TRAY) ×2 IMPLANT
PAD ARMBOARD 7.5X6 YLW CONV (MISCELLANEOUS) ×4 IMPLANT
PAD CAST 4YDX4 CTTN HI CHSV (CAST SUPPLIES) IMPLANT
PADDING CAST COTTON 4X4 STRL (CAST SUPPLIES) ×2
SCREW CANN FT 6.5X130 (Screw) IMPLANT
SCREW CANN FT 7.5 X 180 STL (Screw) IMPLANT
SCREW CANN FT 7.5X140 (Screw) IMPLANT
SPONGE T-LAP 18X18 ~~LOC~~+RFID (SPONGE) IMPLANT
STAPLER VISISTAT 35W (STAPLE) ×2 IMPLANT
SUCTION TUBE FRAZIER 10FR DISP (SUCTIONS) ×2 IMPLANT
SUT ETHILON 3 0 PS 1 (SUTURE) IMPLANT
SUT MNCRL AB 3-0 PS2 18 (SUTURE) ×2 IMPLANT
SUT MON AB 2-0 CT1 36 (SUTURE) ×2 IMPLANT
TRAY FOLEY MTR SLVR 16FR STAT (SET/KITS/TRAYS/PACK) IMPLANT
WASHER F/7.5 CANN SCREW (Washer) IMPLANT
WATER STERILE IRR 1000ML POUR (IV SOLUTION) ×2 IMPLANT

## 2023-04-24 NOTE — Op Note (Signed)
Orthopaedic Surgery Operative Note (CSN: [id number] ) Date of Surgery: 04/24/2023  Admit Date: 04/24/2023   Diagnoses: Pre-Op Diagnoses: LC 3 pelvic ring injury Right open olecranon avulsion injury Right open elbow arthrotomy  Post-Op Diagnosis: Same  Procedures: CPT 27216-Percutaneous fixation of right posterior pelvic fracture CPT 27216-Percutaneous fixation of left sacrum CPT 27217-Percutaneous fixation of right superior pubic ramus fracture CPT 24685-Open treatment of right olecranon avulsion fracture CPT 11012-Irrigation and debridement of right open olecranon fracture CPT 24000-Irrigation and debridement of right open elbow arthrotomy  Surgeons : Primary: Roby Lofts, MD  Assistant: Ulyses Southward, PA-C  Location: OR 3   Anesthesia: General   Antibiotics: Ancef 2g preop with 1 gm vancomycin powder placed topically in open elbow wound   Tourniquet time: None    Estimated Blood Loss: 50 mL  Complications:* No complications entered in OR log *   Specimens:* No specimens in log *   Implants: Implant Name Type Inv. Item Serial No. Manufacturer Lot No. LRB No. Used Action  SCREW CANN FT 7.5X140 - [id number] Screw SCREW CANN FT 7.5X140  [employer]   1 Implanted  WASHER F/7.5 CANN SCREW - [id number] Washer WASHER F/7.5 CANN SCREW  [employer]  N/A 1 Implanted  Cannulated screw [id number]      N/A 1 Implanted     Indications for Surgery: 82 year old male who fell approximately 20 feet landing on his right side sustaining an open elbow injury as well as an unstable pelvic ring injury.  Due to the unstable nature of his injuries I recommended proceeding with to the operating room for percutaneous fixation of his pelvis with irrigation debridement of his right elbow.  Risks and benefits were discussed with the patient and his wife.  Risks include but not limited to bleeding, infection, malunion, nonunion, hardware failure, hardware irritation, nerve and blood  vessel injury, DVT, even the possibility anesthetic complications.  They agreed to proceed with surgery and consent was obtained.  Operative Findings: 1.  Percutaneous fixation of right sided posterior pelvic ring injury using a LC corridor screw and a transsacral transiliac screw at S1 with [employer] fully threaded 7.5 mm cannulated screws 2.  Percutaneous fixation of left-sided posterior pelvic ring injury using a transsacral transiliac screw at S1. 3.  Percutaneous fixation of right superior pubic ramus fracture using [employer] fully threaded 6.5 mm cannulated screw 4.  Type IIIa open olecranon avulsion injury with open arthrotomy of the elbow treated with irrigation debridement and primary closure.  Nonoperative management of his olecranon fracture.  Procedure: The patient was identified in the preoperative holding area. Consent was confirmed with the patient and their family and all questions were answered. The operative extremity was marked after confirmation with the patient. he was then brought back to the operating room by our anesthesia colleagues.  He was placed under general anesthetic and carefully transferred over to radiolucent flattop table.  The pelvic binder was removed and a sacral bump was used to elevate his pelvis off of the table for appropriate positioning of percutaneous screw fixation.  I did tape his legs together to make sure there was no instability of his pubic symphysis.  Fluoroscopic imaging was obtained to make sure we had adequate visualization of the pelvis.  We then prepped the pelvis in usual sterile fashion.  A timeout was performed to verify the patient, the procedure, and the extremity.  Preoperative antibiotics were dosed.  I first started out by using inlet view and obturator outlet view to percutaneously  placed a 2.0 mm guidewire for retrograde superior pubic ramus screw.  I was able to identify the appropriate starting point and advanced into the bone approximately  1 cm.  I then cut down on this with an 11 blade.  I then used a 4.5 mm cannulated drill bit to oscillate through the superior pubic ramus across the fracture into the supra-acetabular region.  I removed my drill bit and then passed a bent 2.8 mm threaded guidewire across the fracture in the supra-acetabular region gaining good purchase in the bone.  I left this guidewire in place while I placed the guidewires for the remainder of my screws.  The neck screw was a LC screw.  Using a obturator inlet view and then iliac oblique view I directed a 2.0 mm guidewire at the AIIS and advanced into the bone approximately 1 cm.  I then cut down on this with 11 blade.  I then used a 4.5 mm drill bit to oscillate in the bone across the sciatic buttress crossing the fracture and into the posterior superior iliac spine.  I removed the drill bit and then placed a threaded 2.8 mm guidewire into the path of the drill bit.  I then advanced it until it was in the PSIS.  I then turned my attention to the transsacral transiliac screw at S1.  Due to the fracture plane of the posterior ilium I made sure that it was anterior to this fracture plane and advanced at 1 cm into the bone I cut down on this and used a 4.5 mm drill bit to oscillate across the right sided SI joint and advanced into the S1 body until I reached the far neuroforamen.  I did this under inlet and outlet views.  I then removed the drill bit and placed a threaded 2.8 mm guidewire.  I then advanced this guidewire across into the left SI joint and left sided lateral ilium.  I used a lateral sacral view to make sure that I was in a safe position behind the iliac cortical densities and within the sacral body itself.  Once I had all of the the guidewires position I then started to place the screws.  I started with the retrograde superior pubic ramus screw.  I placed a 6.5 mm fully threaded screw gaining excellent purchase.  I then placed a 7.5 mm fully threaded cannulated  screw at the [facility] corridor.  And lastly I had my assistant provide some internal rotation of both hemipelvis and placed my transsacral transiliac screw to provide fixation across both of the SI joints.  Final fluoroscopic imaging was obtained after I removed the guidewires.  This pelvis was much more stable.  There is no internal or external rotation instability.  The incisions were irrigated and closed with 3-0 Monocryl and Dermabond.  Sterile dressings were applied.  We then turned our attention to the right upper extremity.  We proceeded to prepped and draped the arm.  I then proceeded to explore the arm.  There was an avulsion injury of the olecranon that extended down into the ulnohumeral joint.  There was some contamination that I was able to remove.  We then carefully took the elbow through range of motion which showed no instability.  I then plan to treat the avulsion fracture with no fixation.  The remainder of the elbow joint was stable.  After I cleaned out all the contamination we irrigated it thoroughly.  We then placed a gram of vancomycin powder.  We closed the wound with 2-0 Monocryl and 3-0 nylon.  Sterile dressing was applied.  The patient was then awoke from anesthesia and taken to the PACU in stable condition.   Debridement type: Excisional Debridement  Side: right  Body Location: Elbow  Tools used for debridement: scalpel, scissors, curette, and rongeur  Pre-debridement Wound size (cm):   Length: 14        Width: 8     Depth: 4   Post-debridement Wound size (cm):   N/A-closed  Debridement depth beyond dead/damaged tissue down to healthy viable tissue: yes  Tissue layer involved: skin, subcutaneous tissue, muscle / fascia, bone  Nature of tissue removed: Non-viable tissue  Irrigation volume: 2 L     Irrigation fluid type: Normal Saline  Post Op Plan/Instructions: The patient will be bed to chair transfers for the first 4 to 6 weeks.  He will receive Ancef for open  fracture prophylaxis.  He will be started on Lovenox for DVT prophylaxis once his hemoglobin is stabilized.  Will have him mobilize with physical and Occupational Therapy.  I was present and performed the entire surgery.  Ulyses Southward, PA-C did assist me throughout the case. An assistant was necessary given the difficulty in approach, maintenance of reduction and ability to instrument the fracture.   Truitt Merle, MD [facility]

## 2023-04-24 NOTE — Transfer of Care (Signed)
Immediate Anesthesia Transfer of Care Note  Patient: Louis Juarez  Procedure(s) Performed: ORIF PELVIC FRACTURE WITH PERCUTANEOUS SCREWS (Bilateral) IRRIGATION AND DEBRIDEMENT ELBOW (Right: Elbow)  Patient Location: PACU  Anesthesia Type:General  Level of Consciousness: awake, alert , oriented, and sedated  Airway & Oxygen Therapy: Patient Spontanous Breathing and Patient connected to face mask oxygen  Post-op Assessment: Report given to RN and Post -op Vital signs reviewed and stable  Post vital signs: Reviewed and stable  Last Vitals:  Vitals Value Taken Time  BP 93/48 04/24/23 1806  Temp 36.4 C 04/24/23 1753  Pulse 75 04/24/23 1810  Resp 22 04/24/23 1810  SpO2 97 % 04/24/23 1810  Vitals shown include unfiled device data.  Last Pain:  Vitals:   04/24/23 1451  TempSrc: Oral  PainSc:          Complications: No notable events documented.

## 2023-04-24 NOTE — Consult Note (Signed)
Orthopaedic Trauma Service (OTS) Consult   Patient ID: Louis Juarez MRN: [id number] DOB/AGE: 83-19-1942 83 y.o.  Reason for Consult:Pelvic fractures Referring Physician: Dr. Floyde Parkins, MD Louis Juarez  HPI: Louis Juarez is an 82 y.o. male who is being seen in consultation at the request of Dr. Hulda Humphrey for evaluation of pelvic fracture and a right elbow laceration with air in the ulnohumeral joint.  Patient was in a tree on the ladder fell landing about 20 feet landing on his right side had immediate pain and inability bear weight.  Presented to the emergency room as a trauma.  He was slightly hypotensive and x-rays showed a pelvic ring injury and he was placed in a pelvic binder.  I was consulted due to complexity of his injury felt to be outside the scope of Dr. Maryclare Labrador practice.  Patient was seen and evaluated in the preoperative holding area.  Currently comfortable.  Denies any injuries to his left upper extremity or bilateral lower extremities below his pelvis.  Patient is active he does have a heart history and a defibrillator.  Denies any diabetes.  He lives at home with his wife and ambulates without assist device.  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   Chest pain    CHF (congestive heart failure) (HCC)    Diabetes mellitus without complication (HCC)    Dyspnea    ED (erectile dysfunction)    GERD (gastroesophageal reflux disease)    Hypertension    Insomnia    LBBB (left bundle branch block)    Mixed hyperlipidemia    Nonischemic cardiomyopathy (HCC)    Palpitations    Pneumonia    PVC's (premature ventricular contractions)    Wheezing     Past Surgical History:  Procedure Laterality Date   BI-VENTRICULAR IMPLANTABLE CARDIOVERTER DEFIBRILLATOR  (CRT-D)  02/13/2012   BI-VENTRICULAR IMPLANTABLE CARDIOVERTER DEFIBRILLATOR  (CRT-D)  05/03/2019   [facility]   CARDIAC  CATHETERIZATION  2013   NASAL SINUS SURGERY     1993, 1997, 2006, 2010    History reviewed. No pertinent family history.  Social History:  reports that he has quit smoking. His smoking use included cigarettes. He has never used smokeless tobacco. No history on file for alcohol use and drug use.  Allergies: No Known Allergies  Medications:  No current facility-administered medications on file prior to encounter.   Current Outpatient Medications on File Prior to Encounter  Medication Sig Dispense Refill   amitriptyline (ELAVIL) 100 MG tablet Take 100 mg by mouth at bedtime.     carvedilol (COREG) 25 MG tablet Take 25 mg by mouth 2 (two) times daily.     ezetimibe (ZETIA) 10 MG tablet Take 10 mg by mouth daily.     furosemide (LASIX) 20 MG tablet Take 20 mg by mouth 2 (two) times daily.     levocetirizine (XYZAL) 5 MG tablet Take 5 mg by mouth every evening.     LORazepam (ATIVAN) 0.5 MG tablet Take 0.5 mg by mouth daily.     losartan (COZAAR) 100 MG tablet Take 100 mg by mouth daily.     pantoprazole (PROTONIX) 20 MG tablet Take 20 mg by mouth daily.     pravastatin (PRAVACHOL) 80 MG tablet Take 80 mg by mouth daily.     spironolactone (ALDACTONE) 25 MG tablet Take 25 mg by mouth daily.  tamsulosin (FLOMAX) 0.4 MG CAPS capsule Take 0.4 mg by mouth at bedtime.     zolpidem (AMBIEN) 10 MG tablet Take 10 mg by mouth at bedtime as needed.     albuterol (VENTOLIN HFA) 108 (90 Base) MCG/ACT inhaler Inhale 2 puffs into the lungs every 6 (six) hours as needed.     cefdinir (OMNICEF) 300 MG capsule Take 300 mg by mouth 2 (two) times daily.     doxycycline (VIBRAMYCIN) 100 MG capsule Take 100 mg by mouth 2 (two) times daily as needed.     fluocinolone (SYNALAR) 0.01 % external solution PLEASE SEE ATTACHED FOR DETAILED DIRECTIONS     fluorouracil (EFUDEX) 5 % cream Apply 1 Application topically 2 (two) times a week.     ketoconazole (NIZORAL) 2 % cream Apply 1 Application topically 2 (two)  times daily.       ROS: Constitutional: No fever or chills Vision: No changes in vision ENT: No difficulty swallowing CV: No chest pain Pulm: No SOB or wheezing GI: No nausea or vomiting GU: No urgency or inability to hold urine Skin: No poor wound healing Neurologic: No numbness or tingling Psychiatric: No depression or anxiety Heme: No bruising Allergic: No reaction to medications or food   Exam: Blood pressure 124/72, pulse 75, temperature 97.8 F (36.6 C), temperature source Temporal, resp. rate 14, height 6\' 2"  (1.88 m), weight 90.7 kg, SpO2 93%. General: No acute distress Orientation: Awake alert and oriented x 3 Mood and Affect: Cooperative and pleasant Gait: Unable to assess due to his fractures. Coordination and balance: Within normal limits  Bilateral lower extremities and pelvis show pelvic binder is in place.  No open lesions or wounds.  No significant lesions or deformities to his lower extremities from his thighs to his legs.  He does have intact sensation to bilateral lower extremities.  He is able to dorsiflex and plantarflex his foot and ankle.  He is able to have great toe extension.  He is warm well-perfused feet.  Right upper extremity: Dressing is in place it is with some serosanguineous strikethrough.  He is able to move the elbow without significant discomfort.  He is able to move all of his digits and has intact motor and sensory function to median, radial and ulnar nerve distribution.  He has warm well-perfused hand.  Left upper extremity: Skin without lesions. No tenderness to palpation. Full painless ROM, full strength in each muscle groups without evidence of instability.   Medical Decision Making: Data: Imaging: X-rays and CT scan of his pelvis are reviewed which shows likely an LC 3 pelvic ring injury with zone 1 sacral fracture on the left with attention fracture of the posterior ilium on the right and a superior pubic ramus fracture on the right as  well.  X-rays and CT scan of the right elbow are reviewed which shows possible avulsion fracture off the portion of the olecranon but the majority of the joint remains in place.  There is some gas in the joint as well.  Labs:  Results for orders placed or performed during the hospital encounter of 04/24/23 (from the past 24 hour(s))  Comprehensive metabolic panel     Status: Abnormal   Collection Time: 04/24/23 11:43 AM  Result Value Ref Range   Sodium 137 135 - 145 mmol/L   Potassium 4.5 3.5 - 5.1 mmol/L   Chloride 101 98 - 111 mmol/L   CO2 24 22 - 32 mmol/L   Glucose, Bld 179 (H) 70 -  99 mg/dL   BUN 18 8 - 23 mg/dL   Creatinine, Ser 0.98 (H) 0.61 - 1.24 mg/dL   Calcium 7.9 (L) 8.9 - 10.3 mg/dL   Total Protein 6.5 6.5 - 8.1 g/dL   Albumin 3.0 (L) 3.5 - 5.0 g/dL   AST 75 (H) 15 - 41 U/L   ALT 58 (H) 0 - 44 U/L   Alkaline Phosphatase 91 38 - 126 U/L   Total Bilirubin 0.8 0.3 - 1.2 mg/dL   GFR, Estimated 49 (L) >60 mL/min   Anion gap 12 5 - 15  CBC     Status: Abnormal   Collection Time: 04/24/23 11:43 AM  Result Value Ref Range   WBC 17.1 (H) 4.0 - 10.5 K/uL   RBC 3.70 (L) 4.22 - 5.81 MIL/uL   Hemoglobin 11.2 (L) 13.0 - 17.0 g/dL   HCT 11.9 (L) 14.7 - 82.9 %   MCV 92.7 80.0 - 100.0 fL   MCH 30.3 26.0 - 34.0 pg   MCHC 32.7 30.0 - 36.0 g/dL   RDW 56.2 13.0 - 86.5 %   Platelets 170 150 - 400 K/uL   nRBC 0.1 0.0 - 0.2 %  Ethanol     Status: None   Collection Time: 04/24/23 11:43 AM  Result Value Ref Range   Alcohol, Ethyl (B) <10 <10 mg/dL  Protime-INR     Status: Abnormal   Collection Time: 04/24/23 11:43 AM  Result Value Ref Range   Prothrombin Time 16.4 (H) 11.4 - 15.2 seconds   INR 1.3 (H) 0.8 - 1.2  Initiate MTP (Blood Bank Notification)     Status: None   Collection Time: 04/24/23 12:10 PM  Result Value Ref Range   Initiate Massive Transfusion Protocol      MTP ACTIVATED Performed at [facility], [address] [place], [place] [address]   Prepare  cryoprecipitate     Status: None (Preliminary result)   Collection Time: 04/24/23 12:11 PM  Result Value Ref Range   Unit Number [id number]    Blood Component Type POOL FIBR CMPLX 2D THW    Unit division 00    Status of Unit ISSUED    Transfusion Status OK TO TRANSFUSE   Prepare platelet pheresis     Status: None (Preliminary result)   Collection Time: 04/24/23 12:11 PM  Result Value Ref Range   Unit Number [id number]    Blood Component Type PSORALEN TREATED    Unit division 00    Status of Unit ISSUED    Transfusion Status OK TO TRANSFUSE   Prepare fresh frozen plasma     Status: None (Preliminary result)   Collection Time: 04/24/23 12:23 PM  Result Value Ref Range   Unit Number [id number]    Blood Component Type THAWED PLASMA    Unit division 00    Status of Unit REL FROM [facility]    Unit tag comment VERBAL ORDERS PER DR ZACKOWSKI    Transfusion Status OK TO TRANSFUSE    Unit Number [id number]    Blood Component Type LIQ PLASMA    Unit division 00    Status of Unit REL FROM [facility]    Unit tag comment VERBAL ORDERS PER DR ZACKOWSKI    Transfusion Status OK TO TRANSFUSE    Unit Number [id number]    Blood Component Type LIQ PLASMA    Unit division 00    Status of Unit REL FROM [facility]    Unit tag comment VERBAL ORDERS PER DR  ZACKOWSKI    Transfusion Status OK TO TRANSFUSE    Unit Number [id number]    Blood Component Type LIQ PLASMA    Unit division 00    Status of Unit REL FROM [facility]    Unit tag comment VERBAL ORDERS PER DR ZACKOWSKI    Transfusion Status      OK TO TRANSFUSE Performed at [facility], [address] [place], [place] [address]    Unit Number [id number]    Blood Component Type LIQ PLASMA    Unit division 00    Status of Unit REL FROM [facility]    Unit tag comment VERBAL ORDERS PER DR ZACKOWSKI    Transfusion Status OK TO TRANSFUSE    Unit Number [id number]    Blood Component Type LIQ PLASMA    Unit division 00    Status of  Unit REL FROM [facility]    Unit tag comment VERBAL ORDERS PER DR ZACKOWSKI    Transfusion Status OK TO TRANSFUSE    Unit Number [id number]    Blood Component Type LIQ PLASMA    Unit division 00    Status of Unit REL FROM [facility]    Unit tag comment VERBAL ORDERS PER DR ZACKOWSKI    Transfusion Status OK TO TRANSFUSE    Unit Number [id number]    Blood Component Type THW PLS APHR    Unit division B0    Status of Unit REL FROM [facility]    Unit tag comment VERBAL ORDERS PER DR ZACKOWSKI    Transfusion Status OK TO TRANSFUSE    Unit Number [id number]    Blood Component Type LIQ PLASMA    Unit division 00    Status of Unit ISSUED    Unit tag comment VERBAL ORDERS PER DR ZACKOWSKI    Transfusion Status OK TO TRANSFUSE    Unit Number [id number]    Blood Component Type LIQ PLASMA    Unit division 00    Status of Unit ISSUED    Unit tag comment VERBAL ORDERS PER DR ZACKOWSKI    Transfusion Status OK TO TRANSFUSE    Unit Number [id number]    Blood Component Type LIQ PLASMA    Unit division 00    Status of Unit ISSUED    Unit tag comment VERBAL ORDERS PER DR ZACKOWSKI    Transfusion Status OK TO TRANSFUSE    Unit Number [id number]    Blood Component Type THW PLS APHR    Unit division A0    Status of Unit REL FROM [facility]    Unit tag comment VERBAL ORDERS PER DR ZACKOWSKI    Transfusion Status OK TO TRANSFUSE    Unit Number [id number]    Blood Component Type THW PLS APHR    Unit division B0    Status of Unit REL FROM [facility]    Unit tag comment VERBAL ORDERS PER DR ZACKOWSKI    Transfusion Status OK TO TRANSFUSE    Unit Number [id number]    Blood Component Type LIQ PLASMA    Unit division 00    Status of Unit REL FROM [facility]    Unit tag comment VERBAL ORDERS PER DR ZACKOWSKI    Transfusion Status OK TO TRANSFUSE    Unit Number [id number]    Blood Component Type THW PLS APHR    Unit division 00    Status of Unit REL FROM [facility]    Unit tag comment VERBAL ORDERS  PER DR ZACKOWSKI    Transfusion Status  OK TO TRANSFUSE   Urinalysis, Routine w reflex microscopic -Urine, Clean Catch     Status: None   Collection Time: 04/24/23 12:24 PM  Result Value Ref Range   Color, Urine YELLOW YELLOW   APPearance CLEAR CLEAR   Specific Gravity, Urine 1.012 1.005 - 1.030   pH 5.0 5.0 - 8.0   Glucose, UA NEGATIVE NEGATIVE mg/dL   Hgb urine dipstick NEGATIVE NEGATIVE   Bilirubin Urine NEGATIVE NEGATIVE   Ketones, ur NEGATIVE NEGATIVE mg/dL   Protein, ur NEGATIVE NEGATIVE mg/dL   Nitrite NEGATIVE NEGATIVE   Leukocytes,Ua NEGATIVE NEGATIVE  Type and screen [facility]     Status: None (Preliminary result)   Collection Time: 04/24/23  1:00 PM  Result Value Ref Range   ABO/RH(D) O POS    Antibody Screen NEG    Sample Expiration 04/27/2023,2359    Unit Number [id number]    Blood Component Type RED CELLS,LR    Unit division 00    Status of Unit ISSUED    Unit tag comment VERBAL ORDERS PER DR ZACKOWSKI    Transfusion Status OK TO TRANSFUSE    Crossmatch Result COMPATIBLE    Unit Number [id number]    Blood Component Type RED CELLS,LR    Unit division 00    Status of Unit ISSUED    Unit tag comment VERBAL ORDERS PER DR ZACKOWSKI    Transfusion Status OK TO TRANSFUSE    Crossmatch Result COMPATIBLE    Unit Number [id number]    Blood Component Type RBC LR PHER2    Unit division 00    Status of Unit ISSUED    Unit tag comment VERBAL ORDERS PER DR ZACKOWSKI    Transfusion Status OK TO TRANSFUSE    Crossmatch Result COMPATIBLE    Unit Number [id number]    Blood Component Type RED CELLS,LR    Unit division 00    Status of Unit REL FROM [facility]    Unit tag comment VERBAL ORDERS PER DR ZACKOWSKI    Transfusion Status OK TO TRANSFUSE    Crossmatch Result NOT NEEDED    Unit Number [id number]    Blood Component Type RED CELLS,LR    Unit division 00    Status of Unit REL FROM [facility]    Unit tag comment VERBAL ORDERS PER DR ZACKOWSKI     Transfusion Status OK TO TRANSFUSE    Crossmatch Result NOT NEEDED    Unit Number [id number]    Blood Component Type RED CELLS,LR    Unit division 00    Status of Unit REL FROM [facility]    Unit tag comment VERBAL ORDERS PER DR ZACKOWSKI    Transfusion Status OK TO TRANSFUSE    Crossmatch Result NOT NEEDED    Unit Number [id number]    Blood Component Type RED CELLS,LR    Unit division 00    Status of Unit REL FROM [facility]    Unit tag comment VERBAL ORDERS PER DR ZACKOWSKI    Transfusion Status OK TO TRANSFUSE    Crossmatch Result NOT NEEDED    Unit Number [id number]    Blood Component Type RBC LR PHER1    Unit division 00    Status of Unit REL FROM [facility]    Unit tag comment VERBAL ORDERS PER DR ZACKOWSKI    Transfusion Status OK TO TRANSFUSE    Crossmatch Result NOT NEEDED    Unit Number [id number]    Blood Component Type RBC LR PHER1  Unit division 00    Status of Unit REL FROM [facility]    Unit tag comment VERBAL ORDERS PER DR ZACKOWSKI    Transfusion Status OK TO TRANSFUSE    Crossmatch Result NOT NEEDED    Unit Number [id number]    Blood Component Type RBC LR PHER1    Unit division 00    Status of Unit REL FROM [facility]    Unit tag comment VERBAL ORDERS PER DR ZACKOWSKI    Transfusion Status OK TO TRANSFUSE    Crossmatch Result NOT NEEDED    Unit Number [id number]    Blood Component Type RBC LR PHER1    Unit division 00    Status of Unit REL FROM [facility]    Unit tag comment VERBAL ORDERS PER DR ZACKOWSKI    Transfusion Status OK TO TRANSFUSE    Crossmatch Result NOT NEEDED    Unit Number [id number]    Blood Component Type RED CELLS,LR    Unit division 00    Status of Unit REL FROM [facility]    Unit tag comment VERBAL ORDERS PER DR ZACKOWSKI    Transfusion Status OK TO TRANSFUSE    Crossmatch Result NOT NEEDED    Unit Number [id number]    Blood Component Type RED CELLS,LR    Unit division 00    Status of Unit REL FROM [facility]    Unit tag comment  VERBAL ORDERS PER DR ZACKOWSKI    Transfusion Status OK TO TRANSFUSE    Crossmatch Result NOT NEEDED    Unit Number [id number]    Blood Component Type RED CELLS,LR    Unit division 00    Status of Unit REL FROM [facility]    Unit tag comment VERBAL ORDERS PER DR ZACKOWSKI    Transfusion Status OK TO TRANSFUSE    Crossmatch Result NOT NEEDED    Unit Number [id number]    Blood Component Type RED CELLS,LR    Unit division 00    Status of Unit REL FROM [facility]    Unit tag comment VERBAL ORDERS PER DR ZAKCOWSKI    Transfusion Status OK TO TRANSFUSE    Crossmatch Result NOT NEEDED   Trauma TEG Panel     Status: None   Collection Time: 04/24/23  1:00 PM  Result Value Ref Range   Citrated Kaolin (R) 4.7 4.6 - 9.1 min   Citrated Rapid TEG (MA) 62.9 52 - 70 mm   CFF Max Amplitude 26.1 15 - 32 mm   Lysis at 30 Minutes 0 0.0 - 2.6 %  I-Stat Chem 8, ED     Status: Abnormal   Collection Time: 04/24/23  1:40 PM  Result Value Ref Range   Sodium 138 135 - 145 mmol/L   Potassium 4.6 3.5 - 5.1 mmol/L   Chloride 100 98 - 111 mmol/L   BUN 23 8 - 23 mg/dL   Creatinine, Ser 2.95 (H) 0.61 - 1.24 mg/dL   Glucose, Bld 188 (H) 70 - 99 mg/dL   Calcium, Ion 4.16 (L) 1.15 - 1.40 mmol/L   TCO2 26 22 - 32 mmol/L   Hemoglobin 11.6 (L) 13.0 - 17.0 g/dL   HCT 60.6 (L) 30.1 - 60.1 %  I-Stat Lactic Acid, ED     Status: Abnormal   Collection Time: 04/24/23  1:41 PM  Result Value Ref Range   Lactic Acid, Venous 2.3 (HH) 0.5 - 1.9 mmol/L   Comment NOTIFIED PHYSICIAN      Imaging or Labs ordered: None  Medical history and chart was reviewed and case discussed with medical provider.  Assessment/Plan: 82 year old male status post high-level fall with lateral compression pelvic ring injury with right elbow laceration.  Patient has an unstable pelvic ring injury.  Will plan to proceed for percutaneous fixation of his pelvis.  Risks and benefits were discussed with the patient and his wife.  Risks include but  not limited to bleeding, infection, malunion, nonunion, hardware failure, hardware rotation, nerve and blood vessel injury, DVT, even the possibility anesthetic complication.  They agreed to proceed with surgery and consent was obtained.  We also plan to do an irrigation exploration and closure of his right elbow laceration.  Risks and benefits were discussed with this as well.  Roby Lofts, MD [facility] [phone] (office) [email or web address]

## 2023-04-24 NOTE — H&P (View-Only) (Signed)
[facility]opaedic [facility]uma Service (OTS) Consult   Patient ID: Louis Juarez MRN: [id number] DOB/AGE: 83-19-1942 83 y.o.  Reason for Consult:Pelvic fractures Referring Physician: Dr. Floyde Parkins, MD Louis Juarez  HPI: Louis Juarez is an 82 y.o. male who is being seen in consultation at the request of Dr. Hulda Humphrey for evaluation of pelvic fracture and a right elbow laceration with air in the ulnohumeral joint.  Patient was in a tree on the ladder fell landing about 20 feet landing on his right side had immediate pain and inability bear weight.  Presented to the emergency room as a trauma.  He was slightly hypotensive and x-rays showed a pelvic ring injury and he was placed in a pelvic binder.  I was consulted due to complexity of his injury felt to be outside the scope of Dr. Maryclare Labrador practice.  Patient was seen and evaluated in the preoperative holding area.  Currently comfortable.  Denies any injuries to his left upper extremity or bilateral lower extremities below his pelvis.  Patient is active he does have a heart history and a defibrillator.  Denies any diabetes.  He lives at home with his wife and ambulates without assist device.  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   Chest pain    CHF (congestive heart failure) (HCC)    Diabetes mellitus without complication (HCC)    Dyspnea    ED (erectile dysfunction)    GERD (gastroesophageal reflux disease)    Hypertension    Insomnia    LBBB (left bundle branch block)    Mixed hyperlipidemia    Nonischemic cardiomyopathy (HCC)    Palpitations    Pneumonia    PVC's (premature ventricular contractions)    Wheezing     Past Surgical History:  Procedure Laterality Date   BI-VENTRICULAR IMPLANTABLE CARDIOVERTER DEFIBRILLATOR  (CRT-D)  02/13/2012   BI-VENTRICULAR IMPLANTABLE CARDIOVERTER DEFIBRILLATOR  (CRT-D)  05/03/2019   [facility]   CARDIAC  CATHETERIZATION  2013   NASAL SINUS SURGERY     1993, 1997, 2006, 2010    History reviewed. No pertinent family history.  Social History:  reports that he has quit smoking. His smoking use included cigarettes. He has never used smokeless tobacco. No history on file for alcohol use and drug use.  Allergies: No Known Allergies  Medications:  No current facility-administered medications on file prior to encounter.   Current Outpatient Medications on File Prior to Encounter  Medication Sig Dispense Refill   amitriptyline (ELAVIL) 100 MG tablet Take 100 mg by mouth at bedtime.     carvedilol (COREG) 25 MG tablet Take 25 mg by mouth 2 (two) times daily.     ezetimibe (ZETIA) 10 MG tablet Take 10 mg by mouth daily.     furosemide (LASIX) 20 MG tablet Take 20 mg by mouth 2 (two) times daily.     levocetirizine (XYZAL) 5 MG tablet Take 5 mg by mouth every evening.     LORazepam (ATIVAN) 0.5 MG tablet Take 0.5 mg by mouth daily.     losartan (COZAAR) 100 MG tablet Take 100 mg by mouth daily.     pantoprazole (PROTONIX) 20 MG tablet Take 20 mg by mouth daily.     pravastatin (PRAVACHOL) 80 MG tablet Take 80 mg by mouth daily.     spironolactone (ALDACTONE) 25 MG tablet Take 25 mg by mouth daily.  tamsulosin (FLOMAX) 0.4 MG CAPS capsule Take 0.4 mg by mouth at bedtime.     zolpidem (AMBIEN) 10 MG tablet Take 10 mg by mouth at bedtime as needed.     albuterol (VENTOLIN HFA) 108 (90 Base) MCG/ACT inhaler Inhale 2 puffs into the lungs every 6 (six) hours as needed.     cefdinir (OMNICEF) 300 MG capsule Take 300 mg by mouth 2 (two) times daily.     doxycycline (VIBRAMYCIN) 100 MG capsule Take 100 mg by mouth 2 (two) times daily as needed.     fluocinolone (SYNALAR) 0.01 % external solution PLEASE SEE ATTACHED FOR DETAILED DIRECTIONS     fluorouracil (EFUDEX) 5 % cream Apply 1 Application topically 2 (two) times a week.     ketoconazole (NIZORAL) 2 % cream Apply 1 Application topically 2 (two)  times daily.       ROS: Constitutional: No fever or chills Vision: No changes in vision ENT: No difficulty swallowing CV: No chest pain Pulm: No SOB or wheezing GI: No nausea or vomiting GU: No urgency or inability to hold urine Skin: No poor wound healing Neurologic: No numbness or tingling Psychiatric: No depression or anxiety Heme: No bruising Allergic: No reaction to medications or food   Exam: Blood pressure 124/72, pulse 75, temperature 97.8 F (36.6 C), temperature source Temporal, resp. rate 14, height 6\' 2"  (1.88 m), weight 90.7 kg, SpO2 93%. General: No acute distress Orientation: Awake alert and oriented x 3 Mood and Affect: Cooperative and pleasant Gait: Unable to assess due to his fractures. Coordination and balance: Within normal limits  Bilateral lower extremities and pelvis show pelvic binder is in place.  No open lesions or wounds.  No significant lesions or deformities to his lower extremities from his thighs to his legs.  He does have intact sensation to bilateral lower extremities.  He is able to dorsiflex and plantarflex his foot and ankle.  He is able to have great toe extension.  He is warm well-perfused feet.  Right upper extremity: Dressing is in place it is with some serosanguineous strikethrough.  He is able to move the elbow without significant discomfort.  He is able to move all of his digits and has intact motor and sensory function to median, radial and ulnar nerve distribution.  He has warm well-perfused hand.  Left upper extremity: Skin without lesions. No tenderness to palpation. Full painless ROM, full strength in each muscle groups without evidence of instability.   Medical Decision Making: Data: Imaging: X-rays and CT scan of his pelvis are reviewed which shows likely an LC 3 pelvic ring injury with zone 1 sacral fracture on the left with attention fracture of the posterior ilium on the right and a superior pubic ramus fracture on the right as  well.  X-rays and CT scan of the right elbow are reviewed which shows possible avulsion fracture off the portion of the olecranon but the majority of the joint remains in place.  There is some gas in the joint as well.  Labs:  Results for orders placed or performed during the hospital encounter of 04/24/23 (from the past 24 hour(s))  Comprehensive metabolic panel     Status: Abnormal   Collection Time: 04/24/23 11:43 AM  Result Value Ref Range   Sodium 137 135 - 145 mmol/L   Potassium 4.5 3.5 - 5.1 mmol/L   Chloride 101 98 - 111 mmol/L   CO2 24 22 - 32 mmol/L   Glucose, Bld 179 (H) 70 -  99 mg/dL   BUN 18 8 - 23 mg/dL   Creatinine, Ser 0.98 (H) 0.61 - 1.24 mg/dL   Calcium 7.9 (L) 8.9 - 10.3 mg/dL   Total Protein 6.5 6.5 - 8.1 g/dL   Albumin 3.0 (L) 3.5 - 5.0 g/dL   AST 75 (H) 15 - 41 U/L   ALT 58 (H) 0 - 44 U/L   Alkaline Phosphatase 91 38 - 126 U/L   Total Bilirubin 0.8 0.3 - 1.2 mg/dL   GFR, Estimated 49 (L) >60 mL/min   Anion gap 12 5 - 15  CBC     Status: Abnormal   Collection Time: 04/24/23 11:43 AM  Result Value Ref Range   WBC 17.1 (H) 4.0 - 10.5 K/uL   RBC 3.70 (L) 4.22 - 5.81 MIL/uL   Hemoglobin 11.2 (L) 13.0 - 17.0 g/dL   HCT 11.9 (L) 14.7 - 82.9 %   MCV 92.7 80.0 - 100.0 fL   MCH 30.3 26.0 - 34.0 pg   MCHC 32.7 30.0 - 36.0 g/dL   RDW 56.2 13.0 - 86.5 %   Platelets 170 150 - 400 K/uL   nRBC 0.1 0.0 - 0.2 %  Ethanol     Status: None   Collection Time: 04/24/23 11:43 AM  Result Value Ref Range   Alcohol, Ethyl (B) <10 <10 mg/dL  Protime-INR     Status: Abnormal   Collection Time: 04/24/23 11:43 AM  Result Value Ref Range   Prothrombin Time 16.4 (H) 11.4 - 15.2 seconds   INR 1.3 (H) 0.8 - 1.2  Initiate MTP (Blood Bank Notification)     Status: None   Collection Time: 04/24/23 12:10 PM  Result Value Ref Range   Initiate Massive Transfusion Protocol      MTP ACTIVATED Performed at [facility], [address] [place], [place] [address]   Prepare  cryoprecipitate     Status: None (Preliminary result)   Collection Time: 04/24/23 12:11 PM  Result Value Ref Range   Unit Number [id number]    Blood Component Type POOL FIBR CMPLX 2D THW    Unit division 00    Status of Unit ISSUED    Transfusion Status OK TO TRANSFUSE   Prepare platelet pheresis     Status: None (Preliminary result)   Collection Time: 04/24/23 12:11 PM  Result Value Ref Range   Unit Number [id number]    Blood Component Type PSORALEN TREATED    Unit division 00    Status of Unit ISSUED    Transfusion Status OK TO TRANSFUSE   Prepare fresh frozen plasma     Status: None (Preliminary result)   Collection Time: 04/24/23 12:23 PM  Result Value Ref Range   Unit Number [id number]    Blood Component Type THAWED PLASMA    Unit division 00    Status of Unit REL FROM [facility]    Unit tag comment VERBAL ORDERS PER DR ZACKOWSKI    Transfusion Status OK TO TRANSFUSE    Unit Number [id number]    Blood Component Type LIQ PLASMA    Unit division 00    Status of Unit REL FROM [facility]    Unit tag comment VERBAL ORDERS PER DR ZACKOWSKI    Transfusion Status OK TO TRANSFUSE    Unit Number [id number]    Blood Component Type LIQ PLASMA    Unit division 00    Status of Unit REL FROM [facility]    Unit tag comment VERBAL ORDERS PER DR  ZACKOWSKI    Transfusion Status OK TO TRANSFUSE    Unit Number [id number]    Blood Component Type LIQ PLASMA    Unit division 00    Status of Unit REL FROM [facility]    Unit tag comment VERBAL ORDERS PER DR ZACKOWSKI    Transfusion Status      OK TO TRANSFUSE Performed at [facility], [address] [place], [place] [address]    Unit Number [id number]    Blood Component Type LIQ PLASMA    Unit division 00    Status of Unit REL FROM [facility]    Unit tag comment VERBAL ORDERS PER DR ZACKOWSKI    Transfusion Status OK TO TRANSFUSE    Unit Number [id number]    Blood Component Type LIQ PLASMA    Unit division 00    Status of  Unit REL FROM [facility]    Unit tag comment VERBAL ORDERS PER DR ZACKOWSKI    Transfusion Status OK TO TRANSFUSE    Unit Number [id number]    Blood Component Type LIQ PLASMA    Unit division 00    Status of Unit REL FROM [facility]    Unit tag comment VERBAL ORDERS PER DR ZACKOWSKI    Transfusion Status OK TO TRANSFUSE    Unit Number [id number]    Blood Component Type THW PLS APHR    Unit division B0    Status of Unit REL FROM [facility]    Unit tag comment VERBAL ORDERS PER DR ZACKOWSKI    Transfusion Status OK TO TRANSFUSE    Unit Number [id number]    Blood Component Type LIQ PLASMA    Unit division 00    Status of Unit ISSUED    Unit tag comment VERBAL ORDERS PER DR ZACKOWSKI    Transfusion Status OK TO TRANSFUSE    Unit Number [id number]    Blood Component Type LIQ PLASMA    Unit division 00    Status of Unit ISSUED    Unit tag comment VERBAL ORDERS PER DR ZACKOWSKI    Transfusion Status OK TO TRANSFUSE    Unit Number [id number]    Blood Component Type LIQ PLASMA    Unit division 00    Status of Unit ISSUED    Unit tag comment VERBAL ORDERS PER DR ZACKOWSKI    Transfusion Status OK TO TRANSFUSE    Unit Number [id number]    Blood Component Type THW PLS APHR    Unit division A0    Status of Unit REL FROM [facility]    Unit tag comment VERBAL ORDERS PER DR ZACKOWSKI    Transfusion Status OK TO TRANSFUSE    Unit Number [id number]    Blood Component Type THW PLS APHR    Unit division B0    Status of Unit REL FROM [facility]    Unit tag comment VERBAL ORDERS PER DR ZACKOWSKI    Transfusion Status OK TO TRANSFUSE    Unit Number [id number]    Blood Component Type LIQ PLASMA    Unit division 00    Status of Unit REL FROM [facility]    Unit tag comment VERBAL ORDERS PER DR ZACKOWSKI    Transfusion Status OK TO TRANSFUSE    Unit Number [id number]    Blood Component Type THW PLS APHR    Unit division 00    Status of Unit REL FROM [facility]    Unit tag comment VERBAL ORDERS  PER DR ZACKOWSKI    Transfusion Status  OK TO TRANSFUSE   Urinalysis, Routine w reflex microscopic -Urine, Clean Catch     Status: None   Collection Time: 04/24/23 12:24 PM  Result Value Ref Range   Color, Urine YELLOW YELLOW   APPearance CLEAR CLEAR   Specific Gravity, Urine 1.012 1.005 - 1.030   pH 5.0 5.0 - 8.0   Glucose, UA NEGATIVE NEGATIVE mg/dL   Hgb urine dipstick NEGATIVE NEGATIVE   Bilirubin Urine NEGATIVE NEGATIVE   Ketones, ur NEGATIVE NEGATIVE mg/dL   Protein, ur NEGATIVE NEGATIVE mg/dL   Nitrite NEGATIVE NEGATIVE   Leukocytes,Ua NEGATIVE NEGATIVE  Type and screen [facility]     Status: None (Preliminary result)   Collection Time: 04/24/23  1:00 PM  Result Value Ref Range   ABO/RH(D) O POS    Antibody Screen NEG    Sample Expiration 04/27/2023,2359    Unit Number [id number]    Blood Component Type RED CELLS,LR    Unit division 00    Status of Unit ISSUED    Unit tag comment VERBAL ORDERS PER DR ZACKOWSKI    Transfusion Status OK TO TRANSFUSE    Crossmatch Result COMPATIBLE    Unit Number [id number]    Blood Component Type RED CELLS,LR    Unit division 00    Status of Unit ISSUED    Unit tag comment VERBAL ORDERS PER DR ZACKOWSKI    Transfusion Status OK TO TRANSFUSE    Crossmatch Result COMPATIBLE    Unit Number [id number]    Blood Component Type RBC LR PHER2    Unit division 00    Status of Unit ISSUED    Unit tag comment VERBAL ORDERS PER DR ZACKOWSKI    Transfusion Status OK TO TRANSFUSE    Crossmatch Result COMPATIBLE    Unit Number [id number]    Blood Component Type RED CELLS,LR    Unit division 00    Status of Unit REL FROM [facility]    Unit tag comment VERBAL ORDERS PER DR ZACKOWSKI    Transfusion Status OK TO TRANSFUSE    Crossmatch Result NOT NEEDED    Unit Number [id number]    Blood Component Type RED CELLS,LR    Unit division 00    Status of Unit REL FROM [facility]    Unit tag comment VERBAL ORDERS PER DR ZACKOWSKI     Transfusion Status OK TO TRANSFUSE    Crossmatch Result NOT NEEDED    Unit Number [id number]    Blood Component Type RED CELLS,LR    Unit division 00    Status of Unit REL FROM [facility]    Unit tag comment VERBAL ORDERS PER DR ZACKOWSKI    Transfusion Status OK TO TRANSFUSE    Crossmatch Result NOT NEEDED    Unit Number [id number]    Blood Component Type RED CELLS,LR    Unit division 00    Status of Unit REL FROM [facility]    Unit tag comment VERBAL ORDERS PER DR ZACKOWSKI    Transfusion Status OK TO TRANSFUSE    Crossmatch Result NOT NEEDED    Unit Number [id number]    Blood Component Type RBC LR PHER1    Unit division 00    Status of Unit REL FROM [facility]    Unit tag comment VERBAL ORDERS PER DR ZACKOWSKI    Transfusion Status OK TO TRANSFUSE    Crossmatch Result NOT NEEDED    Unit Number [id number]    Blood Component Type RBC LR PHER1  Unit division 00    Status of Unit REL FROM [facility]    Unit tag comment VERBAL ORDERS PER DR ZACKOWSKI    Transfusion Status OK TO TRANSFUSE    Crossmatch Result NOT NEEDED    Unit Number [id number]    Blood Component Type RBC LR PHER1    Unit division 00    Status of Unit REL FROM [facility]    Unit tag comment VERBAL ORDERS PER DR ZACKOWSKI    Transfusion Status OK TO TRANSFUSE    Crossmatch Result NOT NEEDED    Unit Number [id number]    Blood Component Type RBC LR PHER1    Unit division 00    Status of Unit REL FROM [facility]    Unit tag comment VERBAL ORDERS PER DR ZACKOWSKI    Transfusion Status OK TO TRANSFUSE    Crossmatch Result NOT NEEDED    Unit Number [id number]    Blood Component Type RED CELLS,LR    Unit division 00    Status of Unit REL FROM [facility]    Unit tag comment VERBAL ORDERS PER DR ZACKOWSKI    Transfusion Status OK TO TRANSFUSE    Crossmatch Result NOT NEEDED    Unit Number [id number]    Blood Component Type RED CELLS,LR    Unit division 00    Status of Unit REL FROM [facility]    Unit tag comment  VERBAL ORDERS PER DR ZACKOWSKI    Transfusion Status OK TO TRANSFUSE    Crossmatch Result NOT NEEDED    Unit Number [id number]    Blood Component Type RED CELLS,LR    Unit division 00    Status of Unit REL FROM [facility]    Unit tag comment VERBAL ORDERS PER DR ZACKOWSKI    Transfusion Status OK TO TRANSFUSE    Crossmatch Result NOT NEEDED    Unit Number [id number]    Blood Component Type RED CELLS,LR    Unit division 00    Status of Unit REL FROM [facility]    Unit tag comment VERBAL ORDERS PER DR ZAKCOWSKI    Transfusion Status OK TO TRANSFUSE    Crossmatch Result NOT NEEDED   Trauma TEG Panel     Status: None   Collection Time: 04/24/23  1:00 PM  Result Value Ref Range   Citrated Kaolin (R) 4.7 4.6 - 9.1 min   Citrated Rapid TEG (MA) 62.9 52 - 70 mm   CFF Max Amplitude 26.1 15 - 32 mm   Lysis at 30 Minutes 0 0.0 - 2.6 %  I-Stat Chem 8, ED     Status: Abnormal   Collection Time: 04/24/23  1:40 PM  Result Value Ref Range   Sodium 138 135 - 145 mmol/L   Potassium 4.6 3.5 - 5.1 mmol/L   Chloride 100 98 - 111 mmol/L   BUN 23 8 - 23 mg/dL   Creatinine, Ser 2.95 (H) 0.61 - 1.24 mg/dL   Glucose, Bld 188 (H) 70 - 99 mg/dL   Calcium, Ion 4.16 (L) 1.15 - 1.40 mmol/L   TCO2 26 22 - 32 mmol/L   Hemoglobin 11.6 (L) 13.0 - 17.0 g/dL   HCT 60.6 (L) 30.1 - 60.1 %  I-Stat Lactic Acid, ED     Status: Abnormal   Collection Time: 04/24/23  1:41 PM  Result Value Ref Range   Lactic Acid, Venous 2.3 (HH) 0.5 - 1.9 mmol/L   Comment NOTIFIED PHYSICIAN      Imaging or Labs ordered: None  Medical history and chart was reviewed and case discussed with medical provider.  Assessment/Plan: 82 year old male status post high-level fall with lateral compression pelvic ring injury with right elbow laceration.  Patient has an unstable pelvic ring injury.  Will plan to proceed for percutaneous fixation of his pelvis.  Risks and benefits were discussed with the patient and his wife.  Risks include but  not limited to bleeding, infection, malunion, nonunion, hardware failure, hardware rotation, nerve and blood vessel injury, DVT, even the possibility anesthetic complication.  They agreed to proceed with surgery and consent was obtained.  We also plan to do an irrigation exploration and closure of his right elbow laceration.  Risks and benefits were discussed with this as well.  Roby Lofts, MD [facility] [phone] (office) [email or web address]

## 2023-04-24 NOTE — Consult Note (Addendum)
ORTHOPAEDIC CONSULTATION  REQUESTING PHYSICIAN: Vanetta Mulders, MD  Chief Complaint: level 1 trauma after fall out of tree  HPI: Louis Juarez is a 82 y.o. male with  Patient brought in by EMS.  Patient was trimming tree limbs.  When the limb knocked him off the ladder.  Patient fell approximately 20 feet.  No loss of consciousness.  Complaint of pain is to right elbow and right hip.  No leg shortening.  Large laceration to the right elbow area.  Neurovascularly intact.  Glasgow Coma Scale 15.  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  Patient denies any chest pain any shortness of breath.  Patient denies any allergies to antibiotics. Patient received ancef and flagyl at admission to the ED.   EMS patient hypotensive in the field.  Parent blood pressure was originally 50 systolic.  Upon arrival he was 70 systolic. He was placed into a pelvic binder for pelvis fracture and MTP initiated.  PMH: cardiac history with defibrillator  Social History   Socioeconomic History   Marital status: Married    Spouse name: Not on file   Number of children: Not on file   Years of education: Not on file   Highest education level: Not on file  Occupational History   Not on file  Tobacco Use   Smoking status: Not on file   Smokeless tobacco: Not on file  Substance and Sexual Activity   Alcohol use: Not on file   Drug use: Not on file   Sexual activity: Not on file  Other Topics Concern   Not on file  Social History Narrative   Not on file   Social Determinants of Health   Financial Resource Strain: Not on file  Food Insecurity: Not on file  Transportation Needs: Not on file  Physical Activity: Not on file  Stress: Not on file  Social Connections: Not on file   No family history on file. No Known Allergies Prior to Admission medications   Medication Sig Start Date End Date Taking? Authorizing Provider  albuterol (VENTOLIN HFA) 108 (90 Base) MCG/ACT inhaler Inhale 2  puffs into the lungs every 6 (six) hours as needed. 01/13/23   [provider]  amitriptyline (ELAVIL) 100 MG tablet Take 100 mg by mouth at bedtime. 04/14/23   [provider]  carvedilol (COREG) 25 MG tablet Take 25 mg by mouth 2 (two) times daily. 04/17/23   [provider]  cefdinir (OMNICEF) 300 MG capsule Take 300 mg by mouth 2 (two) times daily. 03/17/23   [provider]  doxycycline (VIBRAMYCIN) 100 MG capsule Take 100 mg by mouth 2 (two) times daily as needed. 01/05/23   [provider]  ezetimibe (ZETIA) 10 MG tablet Take 10 mg by mouth daily. 04/08/23   [provider]  fluocinolone (SYNALAR) 0.01 % external solution PLEASE SEE ATTACHED FOR DETAILED DIRECTIONS 01/05/23   [provider]  fluorouracil (EFUDEX) 5 % cream Apply 1 Application topically 2 (two) times a week. 01/05/23   [provider]  furosemide (LASIX) 20 MG tablet Take 20 mg by mouth 2 (two) times daily. 02/21/23   [provider]  ketoconazole (NIZORAL) 2 % cream Apply 1 Application topically 2 (two) times daily. 01/05/23   [provider]  levocetirizine (XYZAL) 5 MG tablet Take 5 mg by mouth every evening. 04/11/23   [provider]  LORazepam (ATIVAN) 0.5 MG tablet Take 0.5 mg by mouth daily. 03/11/23   [provider]  losartan (COZAAR) 100 MG tablet Take 100 mg by mouth daily. 02/27/23   [provider]  pantoprazole (PROTONIX) 20 MG tablet Take 20 mg by mouth daily. 03/24/23   [provider]  pravastatin (PRAVACHOL) 80 MG tablet Take 80 mg by mouth daily. 04/13/23   [provider]  spironolactone (ALDACTONE) 25 MG tablet Take 25 mg by mouth daily. 02/09/23   [provider]  tamsulosin (FLOMAX) 0.4 MG CAPS capsule Take 0.4 mg by mouth at bedtime. 01/30/23   [provider]  zolpidem (AMBIEN) 10 MG tablet Take 10 mg by mouth at bedtime as needed. 03/30/23   [provider]    CT HEAD WO CONTRAST  Result Date: 04/24/2023 CLINICAL DATA:  Head trauma, moderate-severe; Polytrauma, blunt. EXAM: CT HEAD WITHOUT CONTRAST CT CERVICAL SPINE WITHOUT CONTRAST TECHNIQUE: Multidetector CT imaging of the head and cervical spine was performed following the standard protocol without intravenous contrast. Multiplanar CT image reconstructions of the cervical spine were also generated. RADIATION DOSE REDUCTION: This exam was performed according to the departmental dose-optimization program which includes automated exposure control, adjustment of the mA and/or kV according to patient size and/or use of iterative reconstruction technique. COMPARISON:  None Available. FINDINGS: CT HEAD FINDINGS Brain: No acute hemorrhage. Cortical gray-white differentiation is preserved. Patchy hypoattenuation of the cerebral white matter, most consistent with mild chronic small-vessel disease. 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 Prominence of the ventricles and sulci within normal limits for age. No extra-axial collection. Basilar cisterns are patent. Vascular: No hyperdense vessel or unexpected calcification. Skull: No calvarial fracture or suspicious bone lesion. Skull base is unremarkable. Sinuses/Orbits: No acute finding. Other: None. These results were called by telephone at the time of interpretation on 04/24/2023 at 12:35 pm to provider Vanetta Mulders , who verbally acknowledged these results. CT CERVICAL SPINE FINDINGS Alignment: 3 mm degenerative anterolisthesis of C4 on C5. Skull base and vertebrae: Within limitations of mild motion artifact, no acute fracture. Normal craniocervical junction. No suspicious bone lesions. Soft tissues and spinal canal: No prevertebral fluid or swelling. No visible canal hematoma. Disc levels: Multilevel cervical spondylosis, worst at C5-6 and C6-7, where there is at least mild spinal canal stenosis. Upper chest: No acute findings. Other: Severe  atherosclerotic calcifications of the left carotid bulb and proximal left cervical ICA. IMPRESSION: 1. No acute intracranial abnormality. 2. No acute cervical spine fracture or traumatic listhesis. 3. Severe atherosclerotic calcifications of the left carotid bulb and proximal left cervical ICA. Consider nonemergent carotid ultrasound for further evaluation. Electronically Signed   By: Orvan Falconer M.D.   On: 04/24/2023 12:46   CT CERVICAL SPINE WO CONTRAST  Result Date: 04/24/2023 CLINICAL DATA:  Head trauma, moderate-severe; Polytrauma, blunt. EXAM: CT HEAD WITHOUT CONTRAST CT CERVICAL SPINE WITHOUT CONTRAST TECHNIQUE: Multidetector CT imaging of the head and cervical spine was performed following the standard protocol without intravenous contrast. Multiplanar CT image reconstructions of the cervical spine were also generated. RADIATION DOSE REDUCTION: This exam was performed according to the departmental dose-optimization program which includes automated exposure control, adjustment of the mA and/or kV according to patient size and/or use of iterative reconstruction technique. COMPARISON:  None Available. FINDINGS: CT HEAD FINDINGS Brain: No acute hemorrhage. Cortical gray-white differentiation is preserved. Patchy hypoattenuation of the cerebral white matter, most consistent with mild chronic small-vessel disease. 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 Prominence of the ventricles and sulci within normal limits for age. No extra-axial collection.  Basilar cisterns are patent. Vascular: No hyperdense vessel or unexpected calcification. Skull: No calvarial fracture or suspicious bone lesion. Skull base is unremarkable. Sinuses/Orbits: No acute finding. Other: None. These results were called by telephone at the time of interpretation on 04/24/2023 at 12:35 pm to provider Vanetta Mulders , who verbally acknowledged these results. CT CERVICAL SPINE FINDINGS Alignment: 3 mm degenerative  anterolisthesis of C4 on C5. Skull base and vertebrae: Within limitations of mild motion artifact, no acute fracture. Normal craniocervical junction. No suspicious bone lesions. Soft tissues and spinal canal: No prevertebral fluid or swelling. No visible canal hematoma. Disc levels: Multilevel cervical spondylosis, worst at C5-6 and C6-7, where there is at least mild spinal canal stenosis. Upper chest: No acute findings. 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 IMPRESSION: 1. No acute intracranial abnormality. 2. No acute cervical spine fracture or traumatic listhesis. 3. Severe atherosclerotic calcifications of the left carotid bulb and proximal left cervical ICA. Consider nonemergent carotid ultrasound for further evaluation. Electronically Signed   By: Orvan Falconer M.D.   On: 04/24/2023 12:46   Family History Reviewed and non-contributory, no pertinent history of problems with bleeding or anesthesia      Review of Systems 14 system ROS conducted and negative except for that noted in HPI   OBJECTIVE  Vitals:Patient Vitals for the past 8 hrs:  BP Temp Temp src Pulse Resp SpO2 Height Weight  04/24/23 1245 132/66 -- -- 71 17 98 % -- --  04/24/23 1240 (!) 123/55 -- -- 74 (!) 27 99 % -- --  04/24/23 1235 130/65 -- -- 76 (!) 23 95 % -- --  04/24/23 1230 (!) 123/98 -- -- 78 (!) 29 98 % -- --  04/24/23 1225 106/71 -- -- 77 (!) 22 95 % -- --  04/24/23 1220 104/64 -- -- 78 (!) 21 99 % -- --  04/24/23 1215 (!) 91/58 -- -- 73 20 97 % -- --  04/24/23 1210 (!) 84/49 -- -- 73 (!) 24 95 % -- --  04/24/23 1206 -- -- -- -- -- -- 6\' 2"  (1.88 m) 90.7 kg  04/24/23 1206 (!) 71/58 -- -- 73 (!) 30 96 % -- --  04/24/23 1203 (!) 88/58 -- -- 75 (!) 24 92 % -- --  04/24/23 1200 107/69 -- -- 85 (!) 32 95 % -- --  04/24/23 1157 (!) 113/94 -- -- 75 17 95 % -- --  04/24/23 1154 (!) 81/54 -- -- 73 (!) 35 90 % -- --  04/24/23 1151 (!) 74/49 -- -- 67 (!) 26  (!) 86 % -- --  04/24/23 1148 (!) 68/44 -- -- -- (!) 30 -- -- --  04/24/23 1147 (!) 78/52 -- -- -- -- -- -- --  04/24/23 1147 -- 97.8 F (36.6 C) Temporal -- -- -- -- --   General: Alert, in distress due to pain Cardiovascular: Warm extremities noted, palpable pulses in bilateral upper and lower extremity Respiratory: No cyanosis, no use of accessory musculature GI: No organomegaly, abdomen is soft and non-tender Skin: No lesions in the area of chief complaint other than those listed below in MSK exam.  Neurologic: Sensation intact distally save for the below mentioned MSK exam Psychiatric: Patient is competent for consent with normal mood and affect RUE: Laceration over posterior elbow with gross contamination with dirt and debris, gentle irrigation with 2L saline performed Palpable bone with fracture fragments of olecranon tip No other bony deformity or  tenderness appreciated No pulsatile bleeding Motor intact AIN/PIN/U Sensation intact M/R/U 2+ radial pulse LUE: Minor abrasions No bony tenderness or deformity Motor intact AIN/PIN/U Sensation intact M/R/U 2+ radial pulse RLE: Scattered abrasions Tender of ilium with palpable crepitus No pain in rest of extremity, no deformity appreciated Pain with log roll and axial load Motor intact TA/GS/FHL/EHL Sensation intact dp/sp/t/sa/su 2+ DP pulse, foot wwp LLE: Scattered abrasions No bony tenderness or deformity No pain with log roll or axial load Motor intact TA/GS/FHL/EHL Sensation intact dp/sp/t/sa/su 2+ DP pulse, foot wwp    Test Results Imaging CT HEAD WO CONTRAST  Result Date: 04/24/2023 CLINICAL DATA:  Head trauma, moderate-severe; Polytrauma, blunt. EXAM: CT HEAD WITHOUT CONTRAST CT CERVICAL SPINE WITHOUT CONTRAST TECHNIQUE: Multidetector CT imaging of the head and cervical spine was performed following the standard protocol without intravenous contrast. Multiplanar CT image reconstructions of the cervical spine  were also generated. RADIATION DOSE REDUCTION: This exam was performed according to the departmental dose-optimization program which includes automated exposure control, adjustment of the mA and/or kV according to patient size and/or use of iterative reconstruction technique. COMPARISON:  None Available. FINDINGS: CT HEAD FINDINGS Brain: No acute hemorrhage. Cortical gray-white differentiation is preserved. Patchy hypoattenuation of the cerebral white matter, most consistent with mild chronic small-vessel disease. 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 Prominence of the ventricles and sulci within normal limits for age. No extra-axial collection. Basilar cisterns are patent. Vascular: No hyperdense vessel or unexpected calcification. Skull: No calvarial fracture or suspicious bone lesion. Skull base is unremarkable. Sinuses/Orbits: No acute finding. Other: None. These results were called by telephone at the time of interpretation on 04/24/2023 at 12:35 pm to provider Vanetta Mulders , who verbally acknowledged these results. CT CERVICAL SPINE FINDINGS Alignment: 3 mm degenerative anterolisthesis of C4 on C5. Skull base and vertebrae: Within limitations of mild motion artifact, no acute fracture. Normal craniocervical junction. No suspicious bone lesions. Soft tissues and spinal canal: No prevertebral fluid or swelling. No visible canal hematoma. Disc levels: Multilevel cervical spondylosis, worst at C5-6 and C6-7, where there is at least mild spinal canal stenosis. Upper chest: No acute findings. 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 IMPRESSION: 1. No acute intracranial abnormality. 2. No acute cervical spine fracture or traumatic listhesis. 3. Severe atherosclerotic calcifications of the left carotid bulb and proximal left cervical ICA. Consider nonemergent carotid ultrasound for further evaluation. Electronically Signed   By:  Orvan Falconer M.D.   On: 04/24/2023 12:46   CT CERVICAL SPINE WO CONTRAST  Result Date: 04/24/2023 CLINICAL DATA:  Head trauma, moderate-severe; Polytrauma, blunt. EXAM: CT HEAD WITHOUT CONTRAST CT CERVICAL SPINE WITHOUT CONTRAST TECHNIQUE: Multidetector CT imaging of the head and cervical spine was performed following the standard protocol without intravenous contrast. Multiplanar CT image reconstructions of the cervical spine were also generated. RADIATION DOSE REDUCTION: This exam was performed according to the departmental dose-optimization program which includes automated exposure control, adjustment of the mA and/or kV according to patient size and/or use of iterative reconstruction technique. COMPARISON:  None Available. FINDINGS: CT HEAD FINDINGS Brain: No acute hemorrhage. Cortical gray-white differentiation is preserved. Patchy hypoattenuation of the cerebral white matter, most consistent with mild chronic small-vessel disease. 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 Prominence of the ventricles and sulci within normal limits for age. No extra-axial collection. Basilar cisterns are patent. Vascular: No hyperdense vessel or unexpected calcification. Skull: No calvarial fracture or suspicious  bone lesion. Skull base is unremarkable. Sinuses/Orbits: No acute finding. Other: None. These results were called by telephone at the time of interpretation on 04/24/2023 at 12:35 pm to provider Vanetta Mulders , who verbally acknowledged these results. CT CERVICAL SPINE FINDINGS Alignment: 3 mm degenerative anterolisthesis of C4 on C5. Skull base and vertebrae: Within limitations of mild motion artifact, no acute fracture. Normal craniocervical junction. No suspicious bone lesions. Soft tissues and spinal canal: No prevertebral fluid or swelling. No visible canal hematoma. Disc levels: Multilevel cervical spondylosis, worst at C5-6 and C6-7, where there is at least mild spinal canal  stenosis. Upper chest: No acute findings. 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 IMPRESSION: 1. No acute intracranial abnormality. 2. No acute cervical spine fracture or traumatic listhesis. 3. Severe atherosclerotic calcifications of the left carotid bulb and proximal left cervical ICA. Consider nonemergent carotid ultrasound for further evaluation. Electronically Signed   By: Orvan Falconer M.D.   On: 04/24/2023 12:46    CT right elbow: small comminuted fracture fragments of olecranon tip with air present in the joint  AP pelvis and CT chest/abdomen/pelvis: Right LC2 pelvis fracture. No obvious extravasation Labs cbc No results for input(s): "WBC", "HGB", "HCT", "PLT" in the last 72 hours.  Labs inflam No results for input(s): "CRP" in the last 72 hours.  Invalid input(s): "ESR"  Labs coag No results for input(s): "INR", "PTT" in the last 72 hours.  Invalid input(s): "PT"  No results for input(s): "NA", "K", "CL", "CO2", "GLUCOSE", "BUN", "CREATININE", "CALCIUM" in the last 72 hours.   ASSESSMENT AND PLAN: 82 y.o. male with the following:  Right LC2 pelvis fracture, Right elbow olecranon tip fracture with traumatic arthrotomy  This patient requires inpatient admission to manage this problem appropriately. Patient discussed with Dr. Jena Gauss with the Orthopaedic trauma service. Will plan for the OR for pelvic fixation and I&D right elbow  Orthopedics recommends admission to a trauma service and we will provide consultation and follow along  - Weight Bearing Status/Activity: NWB RLE and RUE  - Additional recommended labs/tests: Right elbow XR, Pre-op labs  -VTE Prophylaxis: hold for OR  - Pain control: per trauma team

## 2023-04-24 NOTE — TOC CAGE-AID Note (Signed)
Transition of Care [facility]) - CAGE-AID Screening  Patient Details  Name: Louis Juarez MRN: [id number] Date of Birth: September 19, 1940  Clinical Narrative:  Patient denies any alcohol or drug use, no need for substance abuse resources at this time.  CAGE-AID Screening:   Have You Ever Felt You Ought to Cut Down on Your Drinking or Drug Use?: No Have People Annoyed You By Critizing Your Drinking Or Drug Use?: No Have You Felt Bad Or Guilty About Your Drinking Or Drug Use?: No Have You Ever Had a Drink or Used Drugs First Thing In The Morning to Steady Your Nerves or to Get Rid of a Hangover?: No CAGE-AID Score: 0  Substance Abuse Education Offered: No

## 2023-04-24 NOTE — Anesthesia Preprocedure Evaluation (Addendum)
Anesthesia Evaluation  Patient identified by MRN, date of birth, ID band Patient awake    Reviewed: Allergy & Precautions, NPO status , Patient's Chart, lab work & pertinent test results  Airway Mallampati: II  TM Distance: >3 FB Neck ROM: Limited    Dental  (+) Missing   Pulmonary former smoker   Pulmonary exam normal        Cardiovascular hypertension, Pt. on home beta blockers and Pt. on medications +CHF  Normal cardiovascular exam+ Cardiac Defibrillator   BiVICD   Neuro/Psych   Anxiety      C-spine not cleared    GI/Hepatic Neg liver ROS,GERD  Medicated and Controlled,,  Endo/Other  diabetes    Renal/GU Renal disease     Musculoskeletal negative musculoskeletal ROS (+)    Abdominal   Peds  Hematology  (+) Blood dyscrasia, anemia INR: 1.3   Anesthesia Other Findings Pelvic fracture  Reproductive/Obstetrics                             Anesthesia Physical Anesthesia Plan  ASA: 4 and emergent  Anesthesia Plan: General   Post-op Pain Management:    Induction: Intravenous  PONV Risk Score and Plan: 2 and Ondansetron, Dexamethasone and Treatment may vary due to age or medical condition  Airway Management Planned: Oral ETT  Additional Equipment:   Intra-op Plan:   Post-operative Plan: Possible Post-op intubation/ventilation  Informed Consent: I have reviewed the patients History and Physical, chart, labs and discussed the procedure including the risks, benefits and alternatives for the proposed anesthesia with the patient or authorized representative who has indicated his/her understanding and acceptance.     Dental advisory given  Plan Discussed with: CRNA  Anesthesia Plan Comments: (Potential central line placement discussed Potential arterial line placement discussed )        Anesthesia Quick Evaluation

## 2023-04-24 NOTE — ED Provider Notes (Signed)
[facility] Provider Note   CSN: [id number] Arrival date & time: 04/24/23  1141     History  Chief Complaint  Patient presents with   Louis Juarez    DIMAS SCHECK is a 82 y.o. male.  Patient brought in by EMS.  Patient was trimming tree limbs.  When the limb knocked him off the ladder.  Patient fell approximately 20 feet.  No loss of consciousness.  Complaint of pain is to right elbow and right hip.  No leg shortening.  Large laceration to the right elbow area.  Neurovascularly intact.  Glasgow Coma Scale 15.  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  Patient denies any chest pain any shortness of breath.  Patient denies any allergies to antibiotics.  EMS patient hypotensive in the field.  Parent blood pressure was originally 50 systolic.  Upon arrival he was 70 systolic.  Patient arrived as a level 1 trauma.  Trauma team present.       Home Medications Prior to Admission medications   Not on File      Allergies    Patient has no known allergies.    Review of Systems   Review of Systems  Constitutional:  Negative for chills and fever.  HENT:  Negative for ear pain and sore throat.   Eyes:  Negative for pain and visual disturbance.  Respiratory:  Negative for cough and shortness of breath.   Cardiovascular:  Negative for chest pain and palpitations.  Gastrointestinal:  Negative for abdominal pain and vomiting.  Genitourinary:  Negative for dysuria and hematuria.  Musculoskeletal:  Negative for arthralgias and back pain.  Skin:  Positive for wound. Negative for color change and rash.  Neurological:  Negative for seizures and syncope.  All other systems reviewed and are negative.   Physical Exam Updated Vital Signs BP (!) 113/94   Pulse 75   Temp 97.8 F (36.6 C) (Temporal)   Resp 17   Ht 1.88 m (6\' 2" )   Wt 90.7 kg   SpO2 95%   BMI 25.68 kg/m  Physical Exam Vitals and  nursing note reviewed.  Constitutional:      General: He is not in acute distress.    Appearance: Normal appearance. He is well-developed.  HENT:     Head: Normocephalic.     Comments: Abrasion to the left forehead area.    Mouth/Throat:     Mouth: Mucous membranes are moist.  Eyes:     Extraocular Movements: Extraocular movements intact.     Conjunctiva/sclera: Conjunctivae normal.     Pupils: Pupils are equal, round, and reactive to light.  Neck:     Comments: Cervical collar in place. Cardiovascular:     Rate and Rhythm: Regular rhythm. Tachycardia present.     Heart sounds: No murmur heard. Pulmonary:     Effort: Pulmonary effort is normal. No respiratory distress.     Breath sounds: Normal breath sounds. No wheezing.  Abdominal:     Palpations: Abdomen is soft.     Tenderness: There is no abdominal tenderness.  Musculoskeletal:        General: Signs of injury present. No swelling or deformity.     Comments: Large open wound to right elbow area.  Pain to palpation of the pelvis.  No leg shortening.  No obvious leg deformities.  Good movement of the toes sensation intact to hands and feet.  Cap refill  present but delayed.  Skin:    General: Skin is warm and dry.     Capillary Refill: Capillary refill takes 2 to 3 seconds.  Neurological:     General: No focal deficit present.     Mental Status: He is alert and oriented to person, place, and time.  Psychiatric:        Mood and Affect: Mood normal.     ED Results / Procedures / Treatments   Labs (all labs ordered are listed, but only abnormal results are displayed) Labs Reviewed  COMPREHENSIVE METABOLIC PANEL  CBC  ETHANOL  URINALYSIS, ROUTINE W REFLEX MICROSCOPIC  PROTIME-INR  HEMOGLOBIN AND HEMATOCRIT, BLOOD  HEMOGLOBIN AND HEMATOCRIT, BLOOD  HEMOGLOBIN AND HEMATOCRIT, BLOOD  HEMOGLOBIN AND HEMATOCRIT, BLOOD  HEMOGLOBIN AND HEMATOCRIT, BLOOD  I-STAT CHEM 8, ED  I-STAT CG4 LACTIC ACID, ED  TYPE AND SCREEN   SAMPLE TO BLOOD BANK  MASSIVE TRANSFUSION PROTOCOL ORDER (BLOOD BANK NOTIFICATION)    EKG None  Radiology No results found.  Procedures Procedures    Medications Ordered in ED Medications  0.9 %  sodium chloride infusion (Manually program via Guardrails IV Fluids) (has no administration in time range)  fentaNYL (SUBLIMAZE) injection (25 mcg Intravenous Given 04/24/23 1201)  ceFAZolin (ANCEF) IVPB 2g/100 mL premix (has no administration in time range)    ED Course/ Medical Decision Making/ A&P                                 Medical Decision Making Amount and/or Complexity of Data Reviewed Labs: ordered. Radiology: ordered.  Risk Prescription drug management.   CRITICAL CARE Performed by: Vanetta Mulders Total critical care time: 60 minutes Critical care time was exclusive of separately billable procedures and treating other patients. Critical care was necessary to treat or prevent imminent or life-threatening deterioration. Critical care was time spent personally by me on the following activities: development of treatment plan with patient and/or surrogate as well as nursing, discussions with consultants, evaluation of patient's response to treatment, examination of patient, obtaining history from patient or surrogate, ordering and performing treatments and interventions, ordering and review of laboratory studies, ordering and review of radiographic studies, pulse oximetry and re-evaluation of patient's condition.  Patient hypotensive.  Patient received blood and platelets.  Once we had x-ray of the chest which did not show any acute abnormality.  Did show evidence of his defibrillator left anterior chest.  Patient had x-ray of pelvis which showed pelvic fracture did not appear to be any proximal femur fractures.  Pelvic binder was placed.  This helped patient's pressures.  Patient's oxygen level was a little low was in the 80s on room air put on 2 L but kind of state  upper 80s.  So started on 100% nonrebreather.  Patient now at CT for CT head chest abdomen and pelvis with trauma team.  Trauma team contacted orthopedics about the right elbow and the pelvic fracture.    Final Clinical Impression(s) / ED Diagnoses Final diagnoses:  Fall, initial encounter  Hypotension due to hypovolemia  Closed displaced fracture of pelvis, unspecified part of pelvis, initial encounter [facility])    Rx / DC Orders ED Discharge Orders     None         Vanetta Mulders, MD 04/24/23 1210

## 2023-04-24 NOTE — Interval H&P Note (Signed)
History and Physical Interval Note:  04/24/2023 2:54 PM  Louis Juarez  has presented today for surgery, with the diagnosis of Pelvic fracture.  The various methods of treatment have been discussed with the patient and family. After consideration of risks, benefits and other options for treatment, the patient has consented to  Procedure(s): ORIF PELVIC FRACTURE WITH PERCUTANEOUS SCREWS (Bilateral) as a surgical intervention.  The patient's history has been reviewed, patient examined, no change in status, stable for surgery.  I have reviewed the patient's chart and labs.  Questions were answered to the patient's satisfaction.     Caryn Bee P [place] L[place]ich

## 2023-04-24 NOTE — Anesthesia Procedure Notes (Signed)
Procedure Name: Intubation Date/Time: 04/24/2023 3:52 PM  Performed by: Gus Puma, CRNAPre-anesthesia Checklist: Patient identified, Emergency Drugs available, Suction available and Patient being monitored Patient Re-evaluated:Patient Re-evaluated prior to induction Oxygen Delivery Method: Circle System Utilized Preoxygenation: Pre-oxygenation with 100% oxygen Induction Type: IV induction Ventilation: Mask ventilation without difficulty Laryngoscope Size: Glidescope and 4 Grade View: Grade I Tube type: Oral Number of attempts: 1 Airway Equipment and Method: Stylet and Oral airway Placement Confirmation: ETT inserted through vocal cords under direct vision, positive ETCO2 and breath sounds checked- equal and bilateral Secured at: 23 cm Tube secured with: Tape Dental Injury: Teeth and Oropharynx as per pre-operative assessment  Comments: Patient in C-collar -- video laryngoscopy utilized to minimize neck movement

## 2023-04-24 NOTE — ED Notes (Addendum)
Trauma Response Nurse Documentation  Louis Juarez is a 82 y.o. male arriving to [facility] ED via EMS, C-collar placed.  On No antithrombotic. Trauma was activated as a Level 1 based on the following trauma criteria Anytime Systolic Blood Pressure < 90.  Patient cleared for CT by Dr. Bedelia Person. Pt transported to CT with trauma response nurse present to monitor. RN remained with the patient throughout their absence from the department for clinical observation. GCS 15.  History   No past medical history on file.      Initial Focused Assessment (If applicable, or please see trauma documentation): Patient A&Ox4, GCS 15 Airway intact, bilateral breath sounds Pulses 2+ C-collar placed by EMS Large laceration R elbow Obvious rotation of R leg  CT's Completed:   CT Head, CT C-Spine, CT Chest w/ contrast, and CT abdomen/pelvis w/ contrast + CT cysto bladder, CT elbow  Interventions:  IV CXR/PXR 3U PRBCs/3U FFP Ancef Tdap TXA Flagyl CT Head/Cspine/C/A/P/Cysto/R elbow 1g Calcium 1U Platelets 1U Cryo 25 mcg Fentanyl x3  Plan for disposition:  Admission to ICU   Consults completed:  Orthopaedic Surgeon at 1151, Dr Hulda Humphrey arrived at bedside at 1211.  Event Summary: Patient to the ED after falling from a tree while cutting branches, approximately 20 feet. Rotation of right hip/leg. Large laceration to R elbow. Patient with initial BP 70/40, immediate transfusion of RBCs via [place]. Patient responded well to blood products, received 3U PRBCs/3U FFP/1 Platelet/1 Cryo. Imaging revealed extensive pelvic fractures, soft tissue hematoma, R rib fxs 6-10, 10-12 posterior, R T10 TP fx, T4, T7, T12 compression fxs, comminuted fx L elbow. Pelvic binder placed by Dr Bedelia Person. Patient received Ancef, Flagyl, TXA, Tdap. Plans for patient to go to the OR with Dr Jena Gauss for surgical repair of pelvic fractures. Wife at bedside.  MTP:  3U PRBC/3U FFP/I Platelet/1 Cryo MTP activated d/t ongoing hypotension,  deactivated after patient responded to ongoing resuscitation.  Bedside handoff with ED RN Maggie/Hannah.    Jill Side Shamila Lerch  Trauma Response RN  Please call TRN at [phone] for further assistance.

## 2023-04-24 NOTE — ED Notes (Signed)
Pelvic binder placed.

## 2023-04-24 NOTE — Progress Notes (Signed)
Orthopedic Tech Progress Note Patient Details:  Louis Juarez [birth year] [id number] Level 1 Trauma. Not needed Patient ID: Louis Juarez, male   DOB: 02/06/1941, 82 y.o.   MRN: [id number]  Louis Juarez 04/24/2023, 12:10 PM

## 2023-04-24 NOTE — ED Triage Notes (Signed)
Pt arrives via EMS after fall from tree when cutting branches and fell out of tree, approx 20 ft. Denies LOC or blood thinners. Initial Bp for EMS was 58 palp, given 700 cc fluids and 70/40 on arrival. Pt endorses pain to right hip and laceration to right elbow.

## 2023-04-24 NOTE — Progress Notes (Signed)
PERIOPERATIVE PRESCRIPTION FOR IMPLANTED CARDIAC DEVICE PROGRAMMING  Patient Information: Name:  Layken Doenges.  DOB:  10/15/1940  MRN:  [id number]  Planned Procedure: ORIF pelvic fracture with percutaneous screws  Surgeon:  Dr. Caryn Bee Haddix  Date of Procedure:  04/24/23   Cautery will be used.  Position during surgery:  Supine   Device Information:  Clinic EP Physician:  Dr. Fayrene Fearing Allred>>Dr. Steffanie Dunn  Device Type:  Defibrillator Manufacturer and Phone #:  [employer]: [phone] Pacemaker Dependent?:  No. Date of Last Device Check:  03/17/2023 Normal Device Function?:  Yes.    Electrophysiologist's Recommendations:  Have magnet available. Provide continuous ECG monitoring when magnet is used or reprogramming is to be performed.  Procedure may interfere with device function.  Magnet should be placed over device during procedure.  Per Device Clinic Standing Orders, Wiliam Ke, RN  2:50 PM 04/24/2023

## 2023-04-24 NOTE — H&P (Addendum)
Louis Juarez 02/06/1941  [id number].    Chief Complaint/Reason for Consult: level 1 fall, hypotension  Primary Survey: Airway intact Breath sounds present bilaterally Bilateral radial and pedal pulses intact GCS 15  HPI:  82 y.o. male who presents to [facility] ED as Level 1 trauma via EMS after fall from ladder. He was cutting limbs when one fell and knocked him off the ladder. Fell approximately 20 feet. Denies LOC. Larey Seat on his right side and complained of right elbow and right hip pain. Per EMS he was hypotensive in the field and enroute and no meds given. Pelvic xray in ED showed open book pelvic fracture. He remained hypotensive in ED which improved after transfusion and pelvic binder placement. He denied anticoagulant use.   Past medical history otherwise significant for HF with ICD in place. He denies allergies  He lives at home with his wife.   ROS: Review of Systems  Unable to perform ROS: Acuity of condition    No family history on file.  No past medical history on file.   Social History:  has no history on file for tobacco use, alcohol use, and drug use.  Allergies: No Known Allergies  (Not in a hospital admission)   Blood pressure 120/64, pulse 74, temperature 97.8 F (36.6 C), temperature source Temporal, resp. rate (!) 25, height 6\' 2"  (1.88 m), weight 90.7 kg, SpO2 99%. Physical Exam: General: uncomfortable appearing. NAD HEENT: superficial abrasion to left forehead. Sclera are noninjected.  Pupils equal and round. EOMs intact.  Ears and nose without any masses or lesions.  Mouth is pink and moist. Cervical collar in place Heart: tachycardic. ICD left upper chest. Palpable radial and pedal pulses bilaterally Lungs: CTAB, no wheezes, rhonchi, or rales noted. Tachypnea initially - improved with nonrebreather placement. Abd: soft, NT, ND. No obvious ecchymosis or injury MSK: MAE. Bilateral feet WWP and NVI R elbow with large laceration Small superficial  abrasion right lateral lower back Skin: warm and dry GU: no blood at meatus. foley catheter with clear yellow urine Neuro: Cranial nerves 2-12 grossly intact, sensation is normal throughout Psych: alert and oriented   Results for orders placed or performed during the hospital encounter of 04/24/23 (from the past 48 hour(s))  CBC     Status: Abnormal   Collection Time: 04/24/23 11:43 AM  Result Value Ref Range   WBC 17.1 (H) 4.0 - 10.5 K/uL   RBC 3.70 (L) 4.22 - 5.81 MIL/uL   Hemoglobin 11.2 (L) 13.0 - 17.0 g/dL   HCT 40.9 (L) 81.1 - 91.4 %   MCV 92.7 80.0 - 100.0 fL   MCH 30.3 26.0 - 34.0 pg   MCHC 32.7 30.0 - 36.0 g/dL   RDW 78.2 95.6 - 21.3 %   Platelets 170 150 - 400 K/uL   nRBC 0.1 0.0 - 0.2 %    Comment: Performed at [facility], [address] [place], [place] [address]  Protime-INR     Status: Abnormal   Collection Time: 04/24/23 11:43 AM  Result Value Ref Range   Prothrombin Time 16.4 (H) 11.4 - 15.2 seconds   INR 1.3 (H) 0.8 - 1.2    Comment: (NOTE) INR goal varies based on device and disease states. Performed at [facility], [address] [place], [place] [address]   Initiate MTP (Blood Bank Notification)     Status: None   Collection Time: 04/24/23 12:10 PM  Result Value Ref Range  Initiate Massive Transfusion Protocol      MTP ACTIVATED Performed at [facility], [address] [place], [place] [address]   Prepare cryoprecipitate     Status: None (Preliminary result)   Collection Time: 04/24/23 12:11 PM  Result Value Ref Range   Unit Number [id number]    Blood Component Type POOL FIBR CMPLX 2D THW    Unit division 00    Status of Unit ISSUED    Transfusion Status OK TO TRANSFUSE   Prepare platelet pheresis     Status: None (Preliminary result)   Collection Time: 04/24/23 12:11 PM  Result Value Ref Range   Unit Number [id number]    Blood Component Type PSORALEN TREATED    Unit division 00    Status of Unit ISSUED     Transfusion Status OK TO TRANSFUSE   Prepare fresh frozen plasma     Status: None (Preliminary result)   Collection Time: 04/24/23 12:23 PM  Result Value Ref Range   Unit Number [id number]    Blood Component Type THAWED PLASMA    Unit division 00    Status of Unit ISSUED    Unit tag comment VERBAL ORDERS PER DR ZACKOWSKI    Transfusion Status OK TO TRANSFUSE    Unit Number [id number]    Blood Component Type LIQ PLASMA    Unit division 00    Status of Unit ISSUED    Unit tag comment VERBAL ORDERS PER DR ZACKOWSKI    Transfusion Status OK TO TRANSFUSE    Unit Number [id number]    Blood Component Type LIQ PLASMA    Unit division 00    Status of Unit ISSUED    Unit tag comment VERBAL ORDERS PER DR ZACKOWSKI    Transfusion Status OK TO TRANSFUSE    Unit Number [id number]    Blood Component Type LIQ PLASMA    Unit division 00    Status of Unit ISSUED    Unit tag comment VERBAL ORDERS PER DR ZACKOWSKI    Transfusion Status OK TO TRANSFUSE    Unit Number [id number]    Blood Component Type LIQ PLASMA    Unit division 00    Status of Unit ISSUED    Unit tag comment VERBAL ORDERS PER DR ZACKOWSKI    Transfusion Status OK TO TRANSFUSE    Unit Number [id number]    Blood Component Type LIQ PLASMA    Unit division 00    Status of Unit ISSUED    Unit tag comment VERBAL ORDERS PER DR ZACKOWSKI    Transfusion Status OK TO TRANSFUSE    Unit Number [id number]    Blood Component Type LIQ PLASMA    Unit division 00    Status of Unit ISSUED    Unit tag comment VERBAL ORDERS PER DR ZACKOWSKI    Transfusion Status OK TO TRANSFUSE    Unit Number [id number]    Blood Component Type THW PLS APHR    Unit division B0    Status of Unit ISSUED    Unit tag comment VERBAL ORDERS PER DR ZACKOWSKI    Transfusion Status OK TO TRANSFUSE    Unit Number [id number]    Blood Component Type LIQ PLASMA    Unit division 00    Status of Unit ISSUED    Unit tag comment VERBAL  ORDERS PER DR ZACKOWSKI    Transfusion Status OK TO TRANSFUSE    Unit Number [id number]    Blood Component Type LIQ PLASMA  Unit division 00    Status of Unit ISSUED    Unit tag comment VERBAL ORDERS PER DR ZACKOWSKI    Transfusion Status OK TO TRANSFUSE    Unit Number [id number]    Blood Component Type LIQ PLASMA    Unit division 00    Status of Unit ISSUED    Unit tag comment VERBAL ORDERS PER DR ZACKOWSKI    Transfusion Status OK TO TRANSFUSE    Unit Number [id number]    Blood Component Type THW PLS APHR    Unit division A0    Status of Unit ISSUED    Unit tag comment VERBAL ORDERS PER DR ZACKOWSKI    Transfusion Status OK TO TRANSFUSE    Unit Number [id number]    Blood Component Type THW PLS APHR    Unit division B0    Status of Unit ISSUED    Unit tag comment VERBAL ORDERS PER DR ZACKOWSKI    Transfusion Status OK TO TRANSFUSE    Unit Number [id number]    Blood Component Type LIQ PLASMA    Unit division 00    Status of Unit ISSUED    Unit tag comment VERBAL ORDERS PER DR ZACKOWSKI    Transfusion Status OK TO TRANSFUSE    Unit Number [id number]    Blood Component Type THW PLS APHR    Unit division 00    Status of Unit ISSUED    Unit tag comment VERBAL ORDERS PER DR ZACKOWSKI    Transfusion Status OK TO TRANSFUSE   Urinalysis, Routine w reflex microscopic -Urine, Clean Catch     Status: None   Collection Time: 04/24/23 12:24 PM  Result Value Ref Range   Color, Urine YELLOW YELLOW   APPearance CLEAR CLEAR   Specific Gravity, Urine 1.012 1.005 - 1.030   pH 5.0 5.0 - 8.0   Glucose, UA NEGATIVE NEGATIVE mg/dL   Hgb urine dipstick NEGATIVE NEGATIVE   Bilirubin Urine NEGATIVE NEGATIVE   Ketones, ur NEGATIVE NEGATIVE mg/dL   Protein, ur NEGATIVE NEGATIVE mg/dL   Nitrite NEGATIVE NEGATIVE   Leukocytes,Ua NEGATIVE NEGATIVE    Comment: Performed at [facility], [address], [place], [place] [address]  Type and screen [facility]     Status: None (Preliminary result)   Collection Time: 04/24/23  1:00 PM  Result Value Ref Range   ABO/RH(D) PENDING    Antibody Screen PENDING    Sample Expiration 04/27/2023,2359    Unit Number [id number]    Blood Component Type RED CELLS,LR    Unit division 00    Status of Unit ISSUED    Unit tag comment VERBAL ORDERS PER DR ZACKOWSKI    Transfusion Status OK TO TRANSFUSE    Crossmatch Result PENDING    Unit Number [id number]    Blood Component Type RED CELLS,LR    Unit division 00    Status of Unit ISSUED    Unit tag comment VERBAL ORDERS PER DR ZACKOWSKI    Transfusion Status OK TO TRANSFUSE    Crossmatch Result PENDING    Unit Number [id number]    Blood Component Type RBC LR PHER2    Unit division 00    Status of Unit ISSUED    Unit tag comment VERBAL ORDERS PER DR ZACKOWSKI    Transfusion Status OK TO TRANSFUSE    Crossmatch Result PENDING    Unit Number [id number]    Blood Component Type RED CELLS,LR    Unit division  00    Status of Unit ISSUED    Unit tag comment VERBAL ORDERS PER DR ZACKOWSKI    Transfusion Status OK TO TRANSFUSE    Crossmatch Result PENDING    Unit Number [id number]    Blood Component Type RED CELLS,LR    Unit division 00    Status of Unit ISSUED    Unit tag comment VERBAL ORDERS PER DR ZACKOWSKI    Transfusion Status OK TO TRANSFUSE    Crossmatch Result PENDING    Unit Number [id number]    Blood Component Type RED CELLS,LR    Unit division 00    Status of Unit ISSUED    Unit tag comment VERBAL ORDERS PER DR ZACKOWSKI    Transfusion Status OK TO TRANSFUSE    Crossmatch Result PENDING    Unit Number [id number]    Blood Component Type RED CELLS,LR    Unit division 00    Status of Unit ISSUED    Unit tag comment VERBAL ORDERS PER DR ZACKOWSKI    Transfusion Status OK TO TRANSFUSE    Crossmatch Result PENDING    Unit Number [id number]    Blood Component Type RBC LR PHER1    Unit division 00    Status  of Unit ISSUED    Unit tag comment VERBAL ORDERS PER DR ZACKOWSKI    Transfusion Status OK TO TRANSFUSE    Crossmatch Result PENDING    Unit Number [id number]    Blood Component Type RBC LR PHER1    Unit division 00    Status of Unit ISSUED    Unit tag comment VERBAL ORDERS PER DR ZACKOWSKI    Transfusion Status OK TO TRANSFUSE    Crossmatch Result PENDING    Unit Number [id number]    Blood Component Type RBC LR PHER1    Unit division 00    Status of Unit ISSUED    Unit tag comment VERBAL ORDERS PER DR ZACKOWSKI    Transfusion Status OK TO TRANSFUSE    Crossmatch Result PENDING    Unit Number [id number]    Blood Component Type RBC LR PHER1    Unit division 00    Status of Unit ISSUED    Unit tag comment VERBAL ORDERS PER DR ZACKOWSKI    Transfusion Status OK TO TRANSFUSE    Crossmatch Result PENDING    Unit Number [id number]    Blood Component Type RED CELLS,LR    Unit division 00    Status of Unit REL FROM [facility]    Unit tag comment VERBAL ORDERS PER DR ZACKOWSKI    Transfusion Status OK TO TRANSFUSE    Crossmatch Result PENDING    Unit Number [id number]    Blood Component Type RED CELLS,LR    Unit division 00    Status of Unit REL FROM [facility]    Unit tag comment VERBAL ORDERS PER DR ZACKOWSKI    Transfusion Status OK TO TRANSFUSE    Crossmatch Result PENDING    Unit Number [id number]    Blood Component Type RED CELLS,LR    Unit division 00    Status of Unit REL FROM [facility]    Unit tag comment VERBAL ORDERS PER DR ZACKOWSKI    Transfusion Status OK TO TRANSFUSE    Crossmatch Result PENDING    Unit Number [id number]    Blood Component Type RED CELLS,LR    Unit division 00    Status of Unit REL FROM [facility]    Unit tag comment VERBAL  ORDERS PER DR ZAKCOWSKI    Transfusion Status      OK TO TRANSFUSE Performed at [facility], [address], [place], [place] [address]    Crossmatch Result PENDING    CT HEAD WO CONTRAST  Result Date:  04/24/2023 CLINICAL DATA:  Head trauma, moderate-severe; Polytrauma, blunt. EXAM: CT HEAD WITHOUT CONTRAST CT CERVICAL SPINE WITHOUT CONTRAST TECHNIQUE: Multidetector CT imaging of the head and cervical spine was performed following the standard protocol without intravenous contrast. Multiplanar CT image reconstructions of the cervical spine were also generated. RADIATION DOSE REDUCTION: This exam was performed according to the departmental dose-optimization program which includes automated exposure control, adjustment of the mA and/or kV according to patient size and/or use of iterative reconstruction technique. COMPARISON:  None Available. FINDINGS: CT HEAD FINDINGS Brain: No acute hemorrhage. Cortical gray-white differentiation is preserved. Patchy hypoattenuation of the cerebral white matter, most consistent with mild chronic small-vessel disease. 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 Prominence of the ventricles and sulci within normal limits for age. No extra-axial collection. Basilar cisterns are patent. Vascular: No hyperdense vessel or unexpected calcification. Skull: No calvarial fracture or suspicious bone lesion. Skull base is unremarkable. Sinuses/Orbits: No acute finding. Other: None. These results were called by telephone at the time of interpretation on 04/24/2023 at 12:35 pm to provider Vanetta Mulders , who verbally acknowledged these results. CT CERVICAL SPINE FINDINGS Alignment: 3 mm degenerative anterolisthesis of C4 on C5. Skull base and vertebrae: Within limitations of mild motion artifact, no acute fracture. Normal craniocervical junction. No suspicious bone lesions. Soft tissues and spinal canal: No prevertebral fluid or swelling. No visible canal hematoma. Disc levels: Multilevel cervical spondylosis, worst at C5-6 and C6-7, where there is at least mild spinal canal stenosis. Upper chest: No acute findings. Other: Severe atherosclerotic calcifications of the left  carotid bulb and proximal left cervical ICA. IMPRESSION: 1. No acute intracranial abnormality. 2. No acute cervical spine fracture or traumatic listhesis. 3. Severe atherosclerotic calcifications of the left carotid bulb and proximal left cervical ICA. Consider nonemergent carotid ultrasound for further evaluation. Electronically Signed   By: Orvan Falconer M.D.   On: 04/24/2023 12:46   CT CERVICAL SPINE WO CONTRAST  Result Date: 04/24/2023 CLINICAL DATA:  Head trauma, moderate-severe; Polytrauma, blunt. EXAM: CT HEAD WITHOUT CONTRAST CT CERVICAL SPINE WITHOUT CONTRAST TECHNIQUE: Multidetector CT imaging of the head and cervical spine was performed following the standard protocol without intravenous contrast. Multiplanar CT image reconstructions of the cervical spine were also generated. RADIATION DOSE REDUCTION: This exam was performed according to the departmental dose-optimization program which includes automated exposure control, adjustment of the mA and/or kV according to patient size and/or use of iterative reconstruction technique. COMPARISON:  None Available. FINDINGS: CT HEAD FINDINGS Brain: No acute hemorrhage. Cortical gray-white differentiation is preserved. Patchy hypoattenuation of the cerebral white matter, most consistent with mild chronic small-vessel disease. 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 Prominence of the ventricles and sulci within normal limits for age. No extra-axial collection. Basilar cisterns are patent. Vascular: No hyperdense vessel or unexpected calcification. Skull: No calvarial fracture or suspicious bone lesion. Skull base is unremarkable. Sinuses/Orbits: No acute finding. Other: None. These results were called by telephone at the time of interpretation on 04/24/2023 at 12:35 pm to provider Vanetta Mulders , who verbally acknowledged these results. CT CERVICAL SPINE FINDINGS Alignment: 3 mm degenerative anterolisthesis of C4 on C5. Skull base and  vertebrae: Within limitations of mild motion  artifact, no acute fracture. Normal craniocervical junction. No suspicious bone lesions. Soft tissues and spinal canal: No prevertebral fluid or swelling. No visible canal hematoma. Disc levels: Multilevel cervical spondylosis, worst at C5-6 and C6-7, where there is at least mild spinal canal stenosis. Upper chest: No acute findings. 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 IMPRESSION: 1. No acute intracranial abnormality. 2. No acute cervical spine fracture or traumatic listhesis. 3. Severe atherosclerotic calcifications of the left carotid bulb and proximal left cervical ICA. Consider nonemergent carotid ultrasound for further evaluation. Electronically Signed   By: Orvan Falconer M.D.   On: 04/24/2023 12:46      Assessment/Plan Fall from ladder, 20 ft  Hemmorrhagic shock - hypotension improved with pelvic binder and transfusion. MTP initiated and he received 3/3 in trauma bay and CT scanner. Given TXA. If any further decompensation will need IR consultation for possible embolization given pelvic fx's  Pelvic fractures (R iliac bone, R pubic rami, and right and left sacrum with 10cm diastasis of pubic symphysis) -  Foley and binder placed in trauma bay. Per Ortho. Plan OR for pelvic fixation today. Possible bladder injury on CT and cystogram pending Open R elbow/olecranon fx - Per Ortho. Plan I&D of the elbow today. NWB. Ancef/flagyl  R 6-12th rib fxs - multimodal pain control, pulm toilet, repeat CXR in the AM.  R T10 TP fxs - multimodal pain control.  Age-indeterminate mild compression fractures of T4, T7 and T12 - discussed with NSGY Dr. Jordan Likes who reviewed images. Does not need OR or brace/spine precautions and can assess further once pelvic fractures addressed Incidental findings - Pulmonary nodules and Severe atherosclerotic calcifications of the left carotid bulb and proximal left cervical  ICA. 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 IVF ID: Tdap in ED, Ancef in ED. Cont Ancef/flagyl VTE: SCDs, on hold given hemorrhagic shock on presentation Foley - Cont, CT cysto pending  Dispo: OR with Ortho. possible ICU for monitoring post op pending course  Eric Form, [facility] Surgery 04/24/2023, 1:38 PM Please see Amion for pager number during day hours 7:00am-4:30pm

## 2023-04-25 ENCOUNTER — Inpatient Hospital Stay (HOSPITAL_COMMUNITY): Payer: BC Managed Care – PPO

## 2023-04-25 LAB — PREPARE FRESH FROZEN PLASMA
Unit division: 0
Unit division: 0
Unit division: 0
Unit division: 0
Unit division: 0
Unit division: 0
Unit division: 0
Unit division: 0
Unit division: 0
Unit division: 0
Unit division: 0
Unit division: 0

## 2023-04-25 LAB — BPAM FFP
Blood Product Expiration Date: 202410042359
Blood Product Expiration Date: 202410042359
Blood Product Expiration Date: 202410042359
Blood Product Expiration Date: 202410042359
Blood Product Expiration Date: 202410042359
Blood Product Expiration Date: 202410072359
Blood Product Expiration Date: 202410072359
Blood Product Expiration Date: 202410072359
Blood Product Expiration Date: 202410142359
Blood Product Expiration Date: 202410142359
Blood Product Expiration Date: 202410142359
Blood Product Expiration Date: 202410172359
Blood Product Expiration Date: 202410232359
Blood Product Expiration Date: 202410242359
Blood Product Expiration Date: 202410252359
ISSUE DATE / TIME: 202410041150
ISSUE DATE / TIME: 202410041150
ISSUE DATE / TIME: 202410041150
ISSUE DATE / TIME: 202410041226
ISSUE DATE / TIME: 202410041226
ISSUE DATE / TIME: 202410041226
ISSUE DATE / TIME: 202410041226
ISSUE DATE / TIME: 202410041226
ISSUE DATE / TIME: 202410041226
ISSUE DATE / TIME: 202410041226
ISSUE DATE / TIME: 202410041226
ISSUE DATE / TIME: 202410041240
ISSUE DATE / TIME: 202410041240
ISSUE DATE / TIME: 202410041240
ISSUE DATE / TIME: 202410041240
Unit Type and Rh: 600
Unit Type and Rh: 6200
Unit Type and Rh: 6200
Unit Type and Rh: 6200
Unit Type and Rh: 6200
Unit Type and Rh: 6200
Unit Type and Rh: 6200
Unit Type and Rh: 6200
Unit Type and Rh: 6200
Unit Type and Rh: 6200
Unit Type and Rh: 6200
Unit Type and Rh: 6200
Unit Type and Rh: 6200
Unit Type and Rh: 6200
Unit Type and Rh: 6200

## 2023-04-25 LAB — CBC
HCT: 28.2 % — ABNORMAL LOW (ref 39.0–52.0)
Hemoglobin: 9.5 g/dL — ABNORMAL LOW (ref 13.0–17.0)
MCH: 30.7 pg (ref 26.0–34.0)
MCHC: 33.7 g/dL (ref 30.0–36.0)
MCV: 91.3 fL (ref 80.0–100.0)
Platelets: 104 10*3/uL — ABNORMAL LOW (ref 150–400)
RBC: 3.09 MIL/uL — ABNORMAL LOW (ref 4.22–5.81)
RDW: 14.7 % (ref 11.5–15.5)
WBC: 12.9 10*3/uL — ABNORMAL HIGH (ref 4.0–10.5)
nRBC: 0 % (ref 0.0–0.2)

## 2023-04-25 LAB — PREPARE CRYOPRECIPITATE: Unit division: 0

## 2023-04-25 LAB — BPAM CRYOPRECIPITATE
Blood Product Expiration Date: 202410062359
ISSUE DATE / TIME: 202410041214
Unit Type and Rh: 5100

## 2023-04-25 LAB — BPAM PLATELET PHERESIS
Blood Product Expiration Date: 202410052359
ISSUE DATE / TIME: 202410041214
Unit Type and Rh: 9500

## 2023-04-25 LAB — PREPARE PLATELET PHERESIS: Unit division: 0

## 2023-04-25 LAB — BASIC METABOLIC PANEL
Anion gap: 11 (ref 5–15)
BUN: 26 mg/dL — ABNORMAL HIGH (ref 8–23)
CO2: 24 mmol/L (ref 22–32)
Calcium: 8 mg/dL — ABNORMAL LOW (ref 8.9–10.3)
Chloride: 101 mmol/L (ref 98–111)
Creatinine, Ser: 1.34 mg/dL — ABNORMAL HIGH (ref 0.61–1.24)
GFR, Estimated: 53 mL/min — ABNORMAL LOW (ref >60)
Glucose, Bld: 147 mg/dL — ABNORMAL HIGH (ref 70–99)
Potassium: 4.3 mmol/L (ref 3.5–5.1)
Sodium: 136 mmol/L (ref 135–145)

## 2023-04-25 MED ORDER — CARVEDILOL 12.5 MG PO TABS
25.0000 mg | ORAL_TABLET | Freq: Two times a day (BID) | ORAL | Status: DC
  Administered 2023-04-25 – 2023-04-26 (×2): 25 mg via ORAL
  Filled 2023-04-25 (×5): qty 2

## 2023-04-25 MED ORDER — AMITRIPTYLINE HCL 50 MG PO TABS
100.0000 mg | ORAL_TABLET | Freq: Every day | ORAL | Status: DC
  Administered 2023-04-25 – 2023-04-26 (×2): 100 mg via ORAL
  Filled 2023-04-25 (×3): qty 2

## 2023-04-25 MED ORDER — LORAZEPAM 0.5 MG PO TABS
0.5000 mg | ORAL_TABLET | Freq: Every day | ORAL | Status: DC
  Administered 2023-04-25 – 2023-05-12 (×17): 0.5 mg via ORAL
  Filled 2023-04-25 (×17): qty 1

## 2023-04-25 MED ORDER — ZOLPIDEM TARTRATE 5 MG PO TABS
5.0000 mg | ORAL_TABLET | Freq: Every evening | ORAL | Status: DC | PRN
  Administered 2023-04-25: 5 mg via ORAL
  Filled 2023-04-25: qty 1

## 2023-04-25 MED ORDER — SPIRONOLACTONE 25 MG PO TABS
25.0000 mg | ORAL_TABLET | Freq: Every day | ORAL | Status: DC
  Administered 2023-04-25 – 2023-04-26 (×2): 25 mg via ORAL
  Filled 2023-04-25 (×3): qty 1

## 2023-04-25 MED ORDER — LORATADINE 10 MG PO TABS
10.0000 mg | ORAL_TABLET | Freq: Every evening | ORAL | Status: DC
  Administered 2023-04-25 – 2023-05-12 (×18): 10 mg via ORAL
  Filled 2023-04-25 (×18): qty 1

## 2023-04-25 MED ORDER — FUROSEMIDE 20 MG PO TABS
20.0000 mg | ORAL_TABLET | Freq: Two times a day (BID) | ORAL | Status: DC
  Administered 2023-04-25 – 2023-04-27 (×4): 20 mg via ORAL
  Filled 2023-04-25 (×4): qty 1

## 2023-04-25 MED ORDER — LOSARTAN POTASSIUM 50 MG PO TABS
100.0000 mg | ORAL_TABLET | Freq: Every day | ORAL | Status: DC
  Administered 2023-04-25: 100 mg via ORAL
  Filled 2023-04-25 (×3): qty 2

## 2023-04-25 MED ORDER — PANTOPRAZOLE SODIUM 20 MG PO TBEC
20.0000 mg | DELAYED_RELEASE_TABLET | Freq: Every day | ORAL | Status: DC
  Administered 2023-04-25 – 2023-05-13 (×16): 20 mg via ORAL
  Filled 2023-04-25 (×20): qty 1

## 2023-04-25 MED ORDER — HYDROMORPHONE HCL 1 MG/ML IJ SOLN
0.5000 mg | INTRAMUSCULAR | Status: DC | PRN
  Administered 2023-04-25 – 2023-04-27 (×4): 0.5 mg via INTRAVENOUS
  Filled 2023-04-25 (×4): qty 1

## 2023-04-25 MED ORDER — EZETIMIBE 10 MG PO TABS
10.0000 mg | ORAL_TABLET | Freq: Every day | ORAL | Status: DC
  Administered 2023-04-26 – 2023-05-13 (×16): 10 mg via ORAL
  Filled 2023-04-25 (×17): qty 1

## 2023-04-25 MED ORDER — TAMSULOSIN HCL 0.4 MG PO CAPS
0.4000 mg | ORAL_CAPSULE | Freq: Every day | ORAL | Status: DC
  Administered 2023-04-25 – 2023-04-27 (×3): 0.4 mg via ORAL
  Filled 2023-04-25 (×3): qty 1

## 2023-04-25 MED ORDER — ALBUTEROL SULFATE (2.5 MG/3ML) 0.083% IN NEBU
3.0000 mL | INHALATION_SOLUTION | Freq: Four times a day (QID) | RESPIRATORY_TRACT | Status: DC | PRN
  Administered 2023-04-30 – 2023-05-03 (×2): 3 mL via RESPIRATORY_TRACT
  Filled 2023-04-25 (×2): qty 3

## 2023-04-25 MED ORDER — PRAVASTATIN SODIUM 40 MG PO TABS
80.0000 mg | ORAL_TABLET | Freq: Every day | ORAL | Status: DC
  Administered 2023-04-26 – 2023-05-13 (×16): 80 mg via ORAL
  Filled 2023-04-25 (×17): qty 2

## 2023-04-25 MED ORDER — ZOLPIDEM TARTRATE 5 MG PO TABS: 10.0000 mg | ORAL_TABLET | Freq: Every evening | ORAL | Status: DC | PRN

## 2023-04-25 MED ORDER — ALBUMIN HUMAN 5 % IV SOLN
25.0000 g | Freq: Once | INTRAVENOUS | Status: AC
  Administered 2023-04-25: 25 g via INTRAVENOUS
  Filled 2023-04-25: qty 500

## 2023-04-25 NOTE — Evaluation (Signed)
Physical Therapy Evaluation Patient Details Name: Louis Juarez MRN: [id number] DOB: 1940/09/21 Today's Date: 04/25/2023  History of Present Illness  82 y.o. male presents to [facility] hospital on 04/24/2023 after fall from ladder. Pt found to have open book pelvic fx, open R olceranon fx, R rib 6-12 fx, R T10 TP fx, and age-indeterminate mild compression fxs of T4, T7 and T12. Pt underwent percutaneous fixation of pelvic fxs and open treatment of R olecranon fx on 10/4. PMH includes heart failure, ICD.  Clinical Impression  Pt presents to PT with deficits in functional mobility, balance, strength, power, endurance, pulmonary function. Pt is limited by R low back and pelvic pain, requiring significant physical assistance to mobilize in bed and with poor tolerance for weight shift onto R side of pelvis. Pt was very independent prior to this admission. If activity tolerance is improved pt wil benefit from high intensity inpatient PT services in an effort to return to independent mobility.        If plan is discharge home, recommend the following: Two people to help with walking and/or transfers;Two people to help with bathing/dressing/bathroom;Assistance with cooking/housework;Assist for transportation;Help with stairs or ramp for entrance   Can travel by private vehicle        Equipment Recommendations Wheelchair (measurements PT);Wheelchair cushion (measurements PT);Hospital bed;Hoyer lift  Recommendations for Other Services  Rehab consult    Functional Status Assessment Patient has had a recent decline in their functional status and demonstrates the ability to make significant improvements in function in a reasonable and predictable amount of time.     Precautions / Restrictions Precautions Precautions: Fall Restrictions Weight Bearing Restrictions: Yes RUE Weight Bearing: Weight bearing as tolerated RLE Weight Bearing: Weight bearing as tolerated (for transfers only) LLE Weight Bearing:  Weight bearing as tolerated (for transfers only)      Mobility  Bed Mobility Overal bed mobility: Needs Assistance Bed Mobility: Rolling, Sidelying to Sit, Sit to Supine Rolling: Min assist Sidelying to sit: Mod assist, +2 for physical assistance, HOB elevated   Sit to supine: Max assist, +2 for physical assistance        Transfers Overall transfer level:  (pt declines attempts at standing due to pain)                      Ambulation/Gait                  Stairs            Wheelchair Mobility     Tilt Bed    Modified Rankin (Stroke Patients Only)       Balance Overall balance assessment: Needs assistance Sitting-balance support: Feet supported, Single extremity supported Sitting balance-Leahy Scale: Poor Sitting balance - Comments: anterior and L lateral lean as pt reports significant pain with weight shift onto R pelvis Postural control: Left lateral lean                                   Pertinent Vitals/Pain Pain Assessment Pain Assessment: 0-10 Pain Score: 5  Pain Location: R low back pre-mobility Pain Descriptors / Indicators: Grimacing Pain Intervention(s): Limited activity within patient's tolerance    Home Living Family/patient expects to be discharged to:: Private residence Living Arrangements: Spouse/significant other Available Help at Discharge: Available 24 hours/day Type of Home: House Home Access: Level entry     Alternate Level Stairs-Number of Steps: does  not go upstairs` Home Layout: Two level;Full bath on main level Home Equipment: None      Prior Function Prior Level of Function : Independent/Modified Independent;Driving                     Extremity/Trunk Assessment   Upper Extremity Assessment Upper Extremity Assessment: Defer to OT evaluation    Lower Extremity Assessment Lower Extremity Assessment: Generalized weakness (poor tolerance for weight shift onto R pelvis due to  pain)    Cervical / Trunk Assessment Cervical / Trunk Assessment:  (difficult to assess as pt leaning to left and anteriorly to avoid pressure on R pelvis)  Communication   Communication Communication: No apparent difficulties Cueing Techniques: Verbal cues  Cognition Arousal: Alert Behavior During Therapy: WFL for tasks assessed/performed Overall Cognitive Status: Within Functional Limits for tasks assessed                                          General Comments General comments (skin integrity, edema, etc.): pt on 7L [place] upon PT arrival, pleth often with poor wavelength often durions, pt intermittently desaturating but sats improve when pt not pushing through L hand    Exercises     Assessment/Plan    PT Assessment Patient needs continued PT services  PT Problem List Decreased strength;Decreased activity tolerance;Decreased balance;Decreased mobility;Decreased knowledge of use of DME;Decreased safety awareness;Decreased knowledge of precautions;Pain       PT Treatment Interventions DME instruction;Functional mobility training;Therapeutic activities;Therapeutic exercise;Balance training;Neuromuscular re-education;Patient/family education;Wheelchair mobility training    PT Goals (Current goals can be found in the Care Plan section)  Acute Rehab PT Goals Patient Stated Goal: to reduce pain, return to independence PT Goal Formulation: With patient Time For Goal Achievement: 05/09/23 Potential to Achieve Goals: Fair Additional Goals Additional Goal #1: Pt will mobilize in a manual wheelchair for >50' with minA to demonstrate the ability to mobilize within the home    Frequency Min 1X/week     Co-evaluation PT/OT/SLP Co-Evaluation/Treatment: Yes Reason for Co-Treatment: Complexity of the patient's impairments (multi-system involvement);For patient/therapist safety;To address functional/ADL transfers PT goals addressed during session: Mobility/safety with  mobility;Balance;Strengthening/ROM OT goals addressed during session: ADL's and self-care;Proper use of Adaptive equipment and DME       AM-PAC PT "6 Clicks" Mobility  Outcome Measure Help needed turning from your back to your side while in a flat bed without using bedrails?: A Little Help needed moving from lying on your back to sitting on the side of a flat bed without using bedrails?: Total Help needed moving to and from a bed to a chair (including a wheelchair)?: Total Help needed standing up from a chair using your arms (e.g., wheelchair or bedside chair)?: Total Help needed to walk in hospital room?: Total Help needed climbing 3-5 steps with a railing? : Total 6 Click Score: 8    End of Session Equipment Utilized During Treatment: Oxygen Activity Tolerance: Patient limited by pain Patient left: in bed;with call bell/phone within reach;with bed alarm set Nurse Communication: Mobility status;Need for lift equipment PT Visit Diagnosis: Other abnormalities of gait and mobility (R26.89);Muscle weakness (generalized) (M62.81);Pain Pain - Right/Left: Right Pain - part of body: Hip    Time: 1610-9604 PT Time Calculation (min) (ACUTE ONLY): 18 min   Charges:   PT Evaluation $PT Eval Moderate Complexity: 1 Mod   PT General Charges $$ ACUTE  PT VISIT: 1 Visit         Arlyss Gandy, PT, DPT Acute Rehabilitation Office [phone]   Arlyss Gandy 04/25/2023, 2:12 PM

## 2023-04-25 NOTE — Progress Notes (Signed)
Subjective: 1 Day Post-Op s/p Procedure(s): ORIF PELVIC FRACTURE WITH PERCUTANEOUS SCREWS IRRIGATION AND DEBRIDEMENT ELBOW   Patient is alert, oriented. Pain well controlled. Denies chest pain, SOB, Calf pain. No nausea/vomiting. No other complaints.  Objective:  PE: VITALS:   Vitals:   04/25/23 0407 04/25/23 0500 04/25/23 0518 04/25/23 0600  BP:  103/69 122/61 (!) 123/52  Pulse: 75  74 75  Resp: 19  (!) 28 (!) 23  Temp:      TempSrc:      SpO2: 91%  95% 95%  Weight:      Height:       General: sitting up in bed in no acute distress MSK: pelvic dressing CDI. RUE dressing with bloody drainage at elbow, changed with new 4 x 4's, abd, and ace wrap. Able to flex, extend, abduct all fingers of right hand. Sensation intact to all fingers. + radial pulse. Dorsiflexion and plantarflexion intact at bilateral feet. Sensation intact diffusely at bilateral feet. + DP pulses.   LABS  Results for orders placed or performed during the hospital encounter of 04/24/23 (from the past 24 hour(s))  Comprehensive metabolic panel     Status: Abnormal   Collection Time: 04/24/23 11:43 AM  Result Value Ref Range   Sodium 137 135 - 145 mmol/L   Potassium 4.5 3.5 - 5.1 mmol/L   Chloride 101 98 - 111 mmol/L   CO2 24 22 - 32 mmol/L   Glucose, Bld 179 (H) 70 - 99 mg/dL   BUN 18 8 - 23 mg/dL   Creatinine, Ser 1.61 (H) 0.61 - 1.24 mg/dL   Calcium 7.9 (L) 8.9 - 10.3 mg/dL   Total Protein 6.5 6.5 - 8.1 g/dL   Albumin 3.0 (L) 3.5 - 5.0 g/dL   AST 75 (H) 15 - 41 U/L   ALT 58 (H) 0 - 44 U/L   Alkaline Phosphatase 91 38 - 126 U/L   Total Bilirubin 0.8 0.3 - 1.2 mg/dL   GFR, Estimated 49 (L) >60 mL/min   Anion gap 12 5 - 15  CBC     Status: Abnormal   Collection Time: 04/24/23 11:43 AM  Result Value Ref Range   WBC 17.1 (H) 4.0 - 10.5 K/uL   RBC 3.70 (L) 4.22 - 5.81 MIL/uL   Hemoglobin 11.2 (L) 13.0 - 17.0 g/dL   HCT 09.6 (L) 04.5 - 40.9 %   MCV 92.7 80.0 - 100.0 fL   MCH 30.3 26.0 - 34.0 pg    MCHC 32.7 30.0 - 36.0 g/dL   RDW 81.1 91.4 - 78.2 %   Platelets 170 150 - 400 K/uL   nRBC 0.1 0.0 - 0.2 %  Ethanol     Status: None   Collection Time: 04/24/23 11:43 AM  Result Value Ref Range   Alcohol, Ethyl (B) <10 <10 mg/dL  Protime-INR     Status: Abnormal   Collection Time: 04/24/23 11:43 AM  Result Value Ref Range   Prothrombin Time 16.4 (H) 11.4 - 15.2 seconds   INR 1.3 (H) 0.8 - 1.2  Initiate MTP (Blood Bank Notification)     Status: None   Collection Time: 04/24/23 12:10 PM  Result Value Ref Range   Initiate Massive Transfusion Protocol      MTP ACTIVATED Performed at [facility], [address] [place], [place] [address]   Prepare cryoprecipitate     Status: None (Preliminary result)   Collection Time: 04/24/23 12:11 PM  Result Value Ref  Range   Unit Number [id number]    Blood Component Type POOL FIBR CMPLX 2D THW    Unit division 00    Status of Unit ISSUED    Transfusion Status OK TO TRANSFUSE   Prepare platelet pheresis     Status: None (Preliminary result)   Collection Time: 04/24/23 12:11 PM  Result Value Ref Range   Unit Number [id number]    Blood Component Type PSORALEN TREATED    Unit division 00    Status of Unit ISSUED    Transfusion Status OK TO TRANSFUSE   Prepare fresh frozen plasma     Status: None (Preliminary result)   Collection Time: 04/24/23 12:23 PM  Result Value Ref Range   Unit Number [id number]    Blood Component Type THAWED PLASMA    Unit division 00    Status of Unit REL FROM [facility]    Unit tag comment VERBAL ORDERS PER DR ZACKOWSKI    Transfusion Status OK TO TRANSFUSE    Unit Number [id number]    Blood Component Type LIQ PLASMA    Unit division 00    Status of Unit REL FROM [facility]    Unit tag comment VERBAL ORDERS PER DR ZACKOWSKI    Transfusion Status OK TO TRANSFUSE    Unit Number [id number]    Blood Component Type LIQ PLASMA    Unit division 00    Status of Unit REL FROM [facility]    Unit tag comment  VERBAL ORDERS PER DR ZACKOWSKI    Transfusion Status OK TO TRANSFUSE    Unit Number [id number]    Blood Component Type LIQ PLASMA    Unit division 00    Status of Unit REL FROM [facility]    Unit tag comment VERBAL ORDERS PER DR ZACKOWSKI    Transfusion Status      OK TO TRANSFUSE Performed at [facility], [address] [place], [place] [address]    Unit Number [id number]    Blood Component Type LIQ PLASMA    Unit division 00    Status of Unit REL FROM [facility]    Unit tag comment VERBAL ORDERS PER DR ZACKOWSKI    Transfusion Status OK TO TRANSFUSE    Unit Number [id number]    Blood Component Type LIQ PLASMA    Unit division 00    Status of Unit REL FROM [facility]    Unit tag comment VERBAL ORDERS PER DR ZACKOWSKI    Transfusion Status OK TO TRANSFUSE    Unit Number [id number]    Blood Component Type LIQ PLASMA    Unit division 00    Status of Unit REL FROM [facility]    Unit tag comment VERBAL ORDERS PER DR ZACKOWSKI    Transfusion Status OK TO TRANSFUSE    Unit Number [id number]    Blood Component Type THW PLS APHR    Unit division B0    Status of Unit REL FROM [facility]    Unit tag comment VERBAL ORDERS PER DR ZACKOWSKI    Transfusion Status OK TO TRANSFUSE    Unit Number [id number]    Blood Component Type LIQ PLASMA    Unit division 00    Status of Unit ISSUED    Unit tag comment VERBAL ORDERS PER DR ZACKOWSKI    Transfusion Status OK TO TRANSFUSE    Unit Number [id number]    Blood Component Type LIQ PLASMA    Unit division 00    Status of Unit ISSUED  Unit tag comment VERBAL ORDERS PER DR ZACKOWSKI    Transfusion Status OK TO TRANSFUSE    Unit Number [id number]    Blood Component Type LIQ PLASMA    Unit division 00    Status of Unit ISSUED    Unit tag comment VERBAL ORDERS PER DR ZACKOWSKI    Transfusion Status OK TO TRANSFUSE    Unit Number [id number]    Blood Component Type THW PLS APHR    Unit division A0    Status of Unit REL FROM  [facility]    Unit tag comment VERBAL ORDERS PER DR ZACKOWSKI    Transfusion Status OK TO TRANSFUSE    Unit Number [id number]    Blood Component Type THW PLS APHR    Unit division B0    Status of Unit REL FROM [facility]    Unit tag comment VERBAL ORDERS PER DR ZACKOWSKI    Transfusion Status OK TO TRANSFUSE    Unit Number [id number]    Blood Component Type LIQ PLASMA    Unit division 00    Status of Unit REL FROM [facility]    Unit tag comment VERBAL ORDERS PER DR ZACKOWSKI    Transfusion Status OK TO TRANSFUSE    Unit Number [id number]    Blood Component Type THW PLS APHR    Unit division 00    Status of Unit REL FROM [facility]    Unit tag comment VERBAL ORDERS PER DR ZACKOWSKI    Transfusion Status OK TO TRANSFUSE   Urinalysis, Routine w reflex microscopic -Urine, Clean Catch     Status: None   Collection Time: 04/24/23 12:24 PM  Result Value Ref Range   Color, Urine YELLOW YELLOW   APPearance CLEAR CLEAR   Specific Gravity, Urine 1.012 1.005 - 1.030   pH 5.0 5.0 - 8.0   Glucose, UA NEGATIVE NEGATIVE mg/dL   Hgb urine dipstick NEGATIVE NEGATIVE   Bilirubin Urine NEGATIVE NEGATIVE   Ketones, ur NEGATIVE NEGATIVE mg/dL   Protein, ur NEGATIVE NEGATIVE mg/dL   Nitrite NEGATIVE NEGATIVE   Leukocytes,Ua NEGATIVE NEGATIVE  Type and screen [facility]     Status: None (Preliminary result)   Collection Time: 04/24/23  1:00 PM  Result Value Ref Range   ABO/RH(D) O POS    Antibody Screen NEG    Sample Expiration 04/27/2023,2359    Unit Number [id number]    Blood Component Type RED CELLS,LR    Unit division 00    Status of Unit ISSUED    Unit tag comment VERBAL ORDERS PER DR ZACKOWSKI    Transfusion Status OK TO TRANSFUSE    Crossmatch Result COMPATIBLE    Unit Number [id number]    Blood Component Type RED CELLS,LR    Unit division 00    Status of Unit ISSUED    Unit tag comment VERBAL ORDERS PER DR ZACKOWSKI    Transfusion Status OK TO TRANSFUSE     Crossmatch Result COMPATIBLE    Unit Number [id number]    Blood Component Type RBC LR PHER2    Unit division 00    Status of Unit ISSUED    Unit tag comment VERBAL ORDERS PER DR ZACKOWSKI    Transfusion Status OK TO TRANSFUSE    Crossmatch Result COMPATIBLE    Unit Number [id number]    Blood Component Type RED CELLS,LR    Unit division 00    Status of Unit REL FROM [facility]    Unit tag comment VERBAL ORDERS PER DR  ZACKOWSKI    Transfusion Status OK TO TRANSFUSE    Crossmatch Result NOT NEEDED    Unit Number [id number]    Blood Component Type RED CELLS,LR    Unit division 00    Status of Unit REL FROM [facility]    Unit tag comment VERBAL ORDERS PER DR ZACKOWSKI    Transfusion Status OK TO TRANSFUSE    Crossmatch Result NOT NEEDED    Unit Number [id number]    Blood Component Type RED CELLS,LR    Unit division 00    Status of Unit REL FROM [facility]    Unit tag comment VERBAL ORDERS PER DR ZACKOWSKI    Transfusion Status OK TO TRANSFUSE    Crossmatch Result NOT NEEDED    Unit Number [id number]    Blood Component Type RED CELLS,LR    Unit division 00    Status of Unit REL FROM [facility]    Unit tag comment VERBAL ORDERS PER DR ZACKOWSKI    Transfusion Status OK TO TRANSFUSE    Crossmatch Result NOT NEEDED    Unit Number [id number]    Blood Component Type RBC LR PHER1    Unit division 00    Status of Unit REL FROM [facility]    Unit tag comment VERBAL ORDERS PER DR ZACKOWSKI    Transfusion Status OK TO TRANSFUSE    Crossmatch Result NOT NEEDED    Unit Number [id number]    Blood Component Type RBC LR PHER1    Unit division 00    Status of Unit REL FROM [facility]    Unit tag comment VERBAL ORDERS PER DR ZACKOWSKI    Transfusion Status OK TO TRANSFUSE    Crossmatch Result NOT NEEDED    Unit Number [id number]    Blood Component Type RBC LR PHER1    Unit division 00    Status of Unit REL FROM [facility]    Unit tag comment VERBAL ORDERS PER DR ZACKOWSKI    Transfusion Status  OK TO TRANSFUSE    Crossmatch Result NOT NEEDED    Unit Number [id number]    Blood Component Type RBC LR PHER1    Unit division 00    Status of Unit REL FROM [facility]    Unit tag comment VERBAL ORDERS PER DR ZACKOWSKI    Transfusion Status OK TO TRANSFUSE    Crossmatch Result NOT NEEDED    Unit Number [id number]    Blood Component Type RED CELLS,LR    Unit division 00    Status of Unit REL FROM [facility]    Unit tag comment VERBAL ORDERS PER DR ZACKOWSKI    Transfusion Status OK TO TRANSFUSE    Crossmatch Result NOT NEEDED    Unit Number [id number]    Blood Component Type RED CELLS,LR    Unit division 00    Status of Unit REL FROM [facility]    Unit tag comment VERBAL ORDERS PER DR ZACKOWSKI    Transfusion Status OK TO TRANSFUSE    Crossmatch Result NOT NEEDED    Unit Number [id number]    Blood Component Type RED CELLS,LR    Unit division 00    Status of Unit REL FROM [facility]    Unit tag comment VERBAL ORDERS PER DR ZACKOWSKI    Transfusion Status OK TO TRANSFUSE    Crossmatch Result NOT NEEDED    Unit Number [id number]    Blood Component Type RED CELLS,LR    Unit division 00    Status of Unit REL FROM [facility]  Unit tag comment VERBAL ORDERS PER DR ZAKCOWSKI    Transfusion Status OK TO TRANSFUSE    Crossmatch Result NOT NEEDED   Trauma TEG Panel     Status: None   Collection Time: 04/24/23  1:00 PM  Result Value Ref Range   Citrated Kaolin (R) 4.7 4.6 - 9.1 min   Citrated Rapid TEG (MA) 62.9 52 - 70 mm   CFF Max Amplitude 26.1 15 - 32 mm   Lysis at 30 Minutes 0 0.0 - 2.6 %  I-Stat Chem 8, ED     Status: Abnormal   Collection Time: 04/24/23  1:40 PM  Result Value Ref Range   Sodium 138 135 - 145 mmol/L   Potassium 4.6 3.5 - 5.1 mmol/L   Chloride 100 98 - 111 mmol/L   BUN 23 8 - 23 mg/dL   Creatinine, Ser 0.98 (H) 0.61 - 1.24 mg/dL   Glucose, Bld 119 (H) 70 - 99 mg/dL   Calcium, Ion 1.47 (L) 1.15 - 1.40 mmol/L   TCO2 26 22 - 32 mmol/L   Hemoglobin 11.6 (L) 13.0 -  17.0 g/dL   HCT 82.9 (L) 56.2 - 13.0 %  I-Stat Lactic Acid, ED     Status: Abnormal   Collection Time: 04/24/23  1:41 PM  Result Value Ref Range   Lactic Acid, Venous 2.3 (HH) 0.5 - 1.9 mmol/L   Comment NOTIFIED PHYSICIAN   ABO/Rh     Status: None   Collection Time: 04/24/23  4:05 PM  Result Value Ref Range   ABO/RH(D)      O POS Performed at [facility], [address] [place], [place] [address]   Glucose, capillary     Status: Abnormal   Collection Time: 04/24/23  5:58 PM  Result Value Ref Range   Glucose-Capillary 161 (H) 70 - 99 mg/dL  MRSA Next Gen by PCR, Nasal     Status: None   Collection Time: 04/24/23  7:36 PM   Specimen: Nasal Mucosa; Nasal Swab  Result Value Ref Range   MRSA by PCR Next Gen NOT DETECTED NOT DETECTED  Hemoglobin and hematocrit, blood (STAT)     Status: Abnormal   Collection Time: 04/24/23  9:03 PM  Result Value Ref Range   Hemoglobin 10.7 (L) 13.0 - 17.0 g/dL   HCT 46.9 (L) 62.9 - 52.8 %  Lactic acid, plasma     Status: Abnormal   Collection Time: 04/24/23  9:03 PM  Result Value Ref Range   Lactic Acid, Venous 2.0 (HH) 0.5 - 1.9 mmol/L    CT PELVIS WO CONTRAST  Result Date: 04/25/2023 CLINICAL DATA:  Pelvic fractures, re-evaluate following open reduction. EXAM: CT PELVIS WITHOUT CONTRAST TECHNIQUE: Multidetector CT imaging of the pelvis was performed following the standard protocol without intravenous contrast. RADIATION DOSE REDUCTION: This exam was performed according to the departmental dose-optimization program which includes automated exposure control, adjustment of the mA and/or kV according to patient size and/or use of iterative reconstruction technique. COMPARISON:  CT cystogram yesterday at 12:43 p.m., postoperative five views of pelvis radiographic exam 04/24/2023 at 8:31 p.m. FINDINGS: Urinary Tract: The bladder remains catheterized, on the current exam was contracted, with minimal contrast and air in the bladder. No extravasated  pelvic contrast is seen. The distal ureters are small in caliber without calcific filling defect. Bowel: No dilatation or wall thickening. Normal appendix. Moderate retained stool ascending colon. Rectosigmoid surgical anastomosis intact. Scattered sigmoid diverticula. No diverticulitis. Vascular/Lymphatic: Moderate to heavy aortoiliac  calcific plaques. No distal infrarenal AAA. No adenopathy. Reproductive:  No prostatomegaly. Other: Subcutaneous hematoma continues to be seen in the right buttock. There is overlying scattered subcutaneous air which could be due to recent injection or due to the interval surgery for ORIF. Hemoperitoneum continues to be seen in the right lower abdominopelvic quadrant, and in the lower abdomen over the bladder. Slightly increased in volume today. Additional blood products collecting anterior to the bladder are not significantly changed. No free air is seen. 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 New postsurgical changes. A threaded arthrodesis screw traverses both SI joints transversely entering from the right. A second ORIF screw extends into the anterior lower right iliac wing continuing posteriorly across the posterior right iliac fracture site. A third screw extends through the right superior pubic ramus obliquely upward into the lower right ilium above the acetabulum. A portion of the threads of this screw extend into the anterior superior right hip joint space. There is still about 1 bone width of posterior offset in the right superior pubic ramus fracture of the distal relative proximal fracture fragments. Other post fracture fixation alignment approximates anatomic with continued displacement of comminuted right inferior pubic ramus fragments. Advanced degenerative changes of the visualized lower lumbar spine are again shown. IMPRESSION: 1. Interval increased pubic diastasis, today up to 1.7 cm, previously 1 cm. 2. New  postsurgical changes of SI joint trauma posterior right iliac bone and right superior pubic ramus ORIF. 3. Post fracture fixation alignment approximates anatomic except for continued displacement of comminuted right inferior pubic ramus fragments. 4. Slightly increased hemoperitoneum in the right lower quadrant and lower abdomen. 5. Bladder remains catheterized. No extravasated pelvic contrast is seen. 6. Subcutaneous hematoma in the right buttock. There is overlying subcutaneous air which could be due to recent injection or due to the interval surgery for ORIF. 7. Aortic atherosclerosis. 8. Constipation and diverticulosis. Aortic Atherosclerosis (ICD10-I70.0). Electronically Signed   By: Almira Bar M.D.   On: 04/25/2023 07:14   DG Pelvis Comp Min 3V  Result Date: 04/25/2023 CLINICAL DATA:  Pelvic fracture EXAM: JUDET PELVIS - 3+ VIEW COMPARISON:  None Available. FINDINGS: Five view radiograph the pelvis demonstrates ORIF of a previously noted pelvic fractures, better demonstrated on CT examination of 10/24, with bilateral sacroiliac arthrodesis with a single transverse cannulated screw, right iliac wing fracture with a single inferior cannulated screw extending through the right sacroiliac joint, and right superior pubic ramus fracture with a single longitudinal crenulated screw. These fracture fragments appear in near anatomic alignment. Diastasis of the pubic symphysis again noted. Right inferior pubic ramus fracture demonstrating mild displacement is unchanged. No dislocation. IMPRESSION: 1. ORIF of previously noted pelvic fractures in near anatomic alignment. Electronically Signed   By: Helyn Numbers M.D.   On: 04/25/2023 00:41   DG Pelvis Comp Min 3V  Result Date: 04/24/2023 CLINICAL DATA:  [id number] Surgery, elective [id number] EXAM: JUDET PELVIS - 3+ VIEW COMPARISON:  Preoperative imaging. FINDINGS: Thirty fluoroscopic spot views of the pelvis obtained in the operating room. Screws traverse the  sacroiliac joints, right pelvic wing and right superior ramus. Fluoroscopy time 3 minutes 48 seconds. Dose 78.05 mGy. IMPRESSION: Intraoperative fluoroscopy during pelvic surgery. Electronically Signed   By: Narda Rutherford M.D.   On: 04/24/2023 20:29   DG C-Arm 1-60 Min-No Report  Result Date: 04/24/2023 Fluoroscopy was utilized by the requesting physician.  No radiographic interpretation.   DG C-Arm 1-60 Min-No  Report  Result Date: 04/24/2023 Fluoroscopy was utilized by the requesting physician.  No radiographic interpretation.   DG Elbow 2 Views Right  Result Date: 04/24/2023 CLINICAL DATA:  Status post fall with open wound to the right elbow EXAM: RIGHT ELBOW - 2 VIEW COMPARISON:  Same day CT elbow FINDINGS: Soft tissue injury overlying the proximal posterior aspect of the forearm. Crescentic lucency projects over the volar elbow joint. Ill-defined radiodensities project over the olecranon, corresponding to comminuted fracture on same day CT. IMPRESSION: 1. Ill-defined radiodensities project over the olecranon, corresponding to comminuted fracture on same day CT. 2. Overlying soft tissue injury. Electronically Signed   By: Agustin Cree M.D.   On: 04/24/2023 15:02   CT CYSTOGRAM PELVIS  Result Date: 04/24/2023 CLINICAL DATA:  Fall from tree with multifocal pelvic fractures EXAM: CT CYSTOGRAM (CT PELVIS WITH CONTRAST) TECHNIQUE: Multidetector CT imaging through the pelvis was performed after dilute contrast had been introduced into the bladder for the purposes of performing CT cystography. RADIATION DOSE REDUCTION: This exam was performed according to the departmental dose-optimization program which includes automated exposure control, adjustment of the mA and/or kV according to patient size and/or use of iterative reconstruction technique. CONTRAST:  50mL OMNIPAQUE IOHEXOL 350 MG/ML SOLN COMPARISON:  Same day CT abdomen and pelvis FINDINGS: Urinary Tract: Urinary catheter in-situ. The urinary  bladder is distended with hyperattenuating contrast material and contains a small focus of intraluminal gas. No extraluminal contrast material. Bowel: Partially imaged bowel without bowel wall thickening, distention, or inflammatory changes. Postsurgical changes with anastomosis of the rectosigmoid colon. Vascular/Lymphatic: Aortic atherosclerosis. No enlarged abdominal or pelvic lymph nodes. Reproductive: Prostate is unremarkable. Other: Small volume right retroperitoneal and pelvic hemorrhage and blood products within the extraperitoneal space of Retzius. Musculoskeletal: Please see separately dictated CT chest, abdomen, and pelvis report for detailed findings of the multifocal pelvic fractures. Subcutaneous hematoma overlying the right gluteal region. IMPRESSION: 1. No evidence of bladder rupture. 2. Small volume right retroperitoneal and pelvic hemorrhage and blood products within the extraperitoneal Space of Retzius. Please see separately dictated CT chest, abdomen, and pelvis report for detailed findings of the multifocal pelvic fractures. 3. Subcutaneous hematoma overlying the right gluteal region. 4.  Aortic Atherosclerosis (ICD10-I70.0). Electronically Signed   By: Agustin Cree M.D.   On: 04/24/2023 15:00   CT Elbow Right Wo Contrast  Result Date: 04/24/2023 CLINICAL DATA:  Status post fall, elbow trauma, pain EXAM: CT OF THE UPPER RIGHT EXTREMITY WITHOUT CONTRAST TECHNIQUE: Multidetector CT imaging of the upper right extremity was performed according to the standard protocol. RADIATION DOSE REDUCTION: This exam was performed according to the departmental dose-optimization program which includes automated exposure control, adjustment of the mA and/or kV according to patient size and/or use of iterative reconstruction technique. COMPARISON:  None Available. FINDINGS: Bones/Joint/Cartilage Comminuted fracture of the peripheral aspect of the olecranon with soft tissue gas tracking from the fracture site to  the skin surface consistent with an open fracture. No other acute fracture or dislocation. Small amount of air within the joint space. Mild osteoarthritis of the ulnohumeral joint. Normal alignment. No significant joint effusion. Ligaments Ligaments are suboptimally evaluated by CT. Muscles and Tendons Muscles are normal. No muscle atrophy. No intramuscular fluid collection or hematoma. Soft tissue No fluid collection or hematoma.  No soft tissue mass. IMPRESSION: 1. Comminuted fracture of the peripheral aspect of the olecranon with soft tissue gas tracking from the fracture site to the skin surface consistent with an open fracture. Electronically Signed  By: Elige Ko M.D.   On: 04/24/2023 14:00   DG Pelvis Portable  Result Date: 04/24/2023 CLINICAL DATA:  Status post binder placement. EXAM: PORTABLE PELVIS 1-2 VIEWS COMPARISON:  Same day. FINDINGS: There is continued presence of displaced right superior and inferior pubic rami fractures. Mild diastasis of pubic symphysis is noted which is slightly decreased compared to prior exam. Hip joints are unremarkable. IMPRESSION: Displaced right inferior and superior pubic rami fractures. Mild diastasis of pubic symphysis. Electronically Signed   By: Lupita Raider M.D.   On: 04/24/2023 13:19   DG Pelvis Portable  Result Date: 04/24/2023 CLINICAL DATA:  Fall from tree. EXAM: PORTABLE PELVIS 1-2 VIEWS COMPARISON:  None Available. FINDINGS: Severely displaced fractures are seen involving the right inferior and superior pubic rami. Diastasis of pubic symphysis is noted as well. Hip joints are unremarkable. IMPRESSION: Severely displaced fractures involving the right inferior and superior pubic rami. Diastasis of pubic symphysis is noted as well. CT scan of pelvis is recommended for further evaluation. Electronically Signed   By: Lupita Raider M.D.   On: 04/24/2023 13:17   DG Chest Port 1 View  Result Date: 04/24/2023 CLINICAL DATA:  Fall from tree. EXAM:  PORTABLE CHEST 1 VIEW COMPARISON:  February 09, 2019. FINDINGS: The heart size and mediastinal contours are within normal limits. Left-sided defibrillator is noted. Both lungs are clear. The visualized skeletal structures are unremarkable. IMPRESSION: No active disease. Electronically Signed   By: Lupita Raider M.D.   On: 04/24/2023 13:15   CT CHEST ABDOMEN PELVIS W CONTRAST  Result Date: 04/24/2023 CLINICAL DATA:  Polytrauma, fall from tree EXAM: CT CHEST, ABDOMEN, AND PELVIS WITH CONTRAST TECHNIQUE: Multidetector CT imaging of the chest, abdomen and pelvis was performed following the standard protocol during bolus administration of intravenous contrast. RADIATION DOSE REDUCTION: This exam was performed according to the departmental dose-optimization program which includes automated exposure control, adjustment of the mA and/or kV according to patient size and/or use of iterative reconstruction technique. CONTRAST:  75 mL of Omnipaque 350 COMPARISON:  Chest CT dated June 25, 2011 FINDINGS: CT CHEST FINDINGS Cardiovascular: Normal heart size. No pericardial effusion. Normal caliber thoracic aorta with moderate atherosclerotic disease. Evidence of acute aortic injury, although evaluation of the aortic root and ascending thoracic aorta is limited due to motion artifact. Mediastinum/Nodes: Small hiatal hernia. Thyroid is unremarkable. No enlarged lymph nodes seen in the chest. Lungs/Pleura: Central airways are patent. Mild paraseptal and centrilobular emphysema. Bilateral lower lobe atelectasis. Bilateral solid pulmonary nodules. Largest is a 6 mm nodule of the left lower lobe located on series 5, image 81. Additional reference solid nodule of the right upper lobe measuring 5 mm on image 59. No pleural effusion or pneumothorax. Musculoskeletal: Mildly displaced and nondisplaced fractures of the right 6th-10th ribs and posterior right 10-12th ribs. Right T10 transverse process fracture. Age-indeterminate mild  compression fractures of T4, T7 and T12. CT ABDOMEN PELVIS FINDINGS Hepatobiliary: No hepatic injury or perihepatic hematoma. Gallbladder is unremarkable. Pancreas: Unremarkable. No pancreatic ductal dilatation or surrounding inflammatory changes. Spleen: No splenic injury or perisplenic hematoma. Adrenals/Urinary Tract: No adrenal hemorrhage or renal injury identified. Bladder is unremarkable. Nonobstructing left renal stone. No hydronephrosis. Urinary bladder is decompressed and contains Foley catheter. Stranding is seen in the extraperitoneal spaces surrounding the bladder. Stomach/Bowel: Stomach is within normal limits. Appendix appears normal. No evidence of bowel wall thickening, distention, or inflammatory changes. Vascular/Lymphatic: Aortic atherosclerosis. No enlarged abdominal or pelvic lymph nodes. Reproductive: Prostate  is unremarkable. Other: No intraperitoneal free fluid or free air. Soft tissue hematoma of the right pelvis and right buttocks. No definite evidence of active arterial extravasation, although evaluation is limited due to contrast timing. Musculoskeletal: Comminuted fractures of the right iliac bone and right and left sacrum. Displaced and comminuted fractures of the inferior and superior right pubic rami. Mild diastasis of the pubic symphysis measuring up to 10 mm. IMPRESSION: 1. Extensive comminuted and displaced fractures of the right iliac bone, right pubic rami, and right and left sacrum. Mild diastasis of the pubic symphysis measuring up to 10 mm. 2. Soft tissue hematoma of the right pelvis and right buttocks. 3. Clarita Leber is seen in the extraperitoneal spaces surrounding the bladder which could be due to pelvic hematoma or extraperitoneal bladder rupture. Recommend CT cystogram for further evaluation. 4. Mildly displaced and nondisplaced fractures of the right 6th-10th ribs, posterior right 10-12th ribs, and right T10 transverse process fracture. 5. Age-indeterminate mild  compression fractures of T4, T7 and T12. Correlate for point tenderness. 6. Bilateral solid pulmonary nodules, largest measures 6 mm, new when compared with 2012 exam. Non-contrast chest CT at 3-6 months is recommended. If the nodules are stable at time of repeat CT, then future CT at 18-24 months (from today's scan) is considered optional for low-risk patients, but is recommended for high-risk patients. This recommendation follows the consensus statement: Guidelines for Management of Incidental Pulmonary Nodules Detected on CT Images: From the Fleischner Society 2017; Radiology 2017; 284:228-243. Critical Value/emergent results were called by telephone at the time of interpretation on 04/24/2023 at 12:36 pm to provider Kris Mouton , who verbally acknowledged these results. Electronically Signed   By: Allegra Lai M.D.   On: 04/24/2023 13:09   CT HEAD WO CONTRAST  Result Date: 04/24/2023 CLINICAL DATA:  Head trauma, moderate-severe; Polytrauma, blunt. EXAM: CT HEAD WITHOUT CONTRAST CT CERVICAL SPINE WITHOUT CONTRAST TECHNIQUE: Multidetector CT imaging of the head and cervical spine was performed following the standard protocol without intravenous contrast. Multiplanar CT image reconstructions of the cervical spine were also generated. RADIATION DOSE REDUCTION: This exam was performed according to the departmental dose-optimization program which includes automated exposure control, adjustment of the mA and/or kV according to patient size and/or use of iterative reconstruction technique. COMPARISON:  None Available. FINDINGS: CT HEAD FINDINGS Brain: No acute hemorrhage. Cortical gray-white differentiation is preserved. Patchy hypoattenuation of the cerebral white matter, most consistent with mild chronic small-vessel disease. 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 Prominence of the ventricles and sulci within normal limits for age. No extra-axial collection. Basilar cisterns are  patent. Vascular: No hyperdense vessel or unexpected calcification. Skull: No calvarial fracture or suspicious bone lesion. Skull base is unremarkable. Sinuses/Orbits: No acute finding. Other: None. These results were called by telephone at the time of interpretation on 04/24/2023 at 12:35 pm to provider Vanetta Mulders , who verbally acknowledged these results. CT CERVICAL SPINE FINDINGS Alignment: 3 mm degenerative anterolisthesis of C4 on C5. Skull base and vertebrae: Within limitations of mild motion artifact, no acute fracture. Normal craniocervical junction. No suspicious bone lesions. Soft tissues and spinal canal: No prevertebral fluid or swelling. No visible canal hematoma. Disc levels: Multilevel cervical spondylosis, worst at C5-6 and C6-7, where there is at least mild spinal canal stenosis. Upper chest: No acute findings. 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 IMPRESSION: 1. No acute intracranial abnormality. 2. No acute cervical spine fracture or traumatic listhesis. 3.  Severe atherosclerotic calcifications of the left carotid bulb and proximal left cervical ICA. Consider nonemergent carotid ultrasound for further evaluation. Electronically Signed   By: Orvan Falconer M.D.   On: 04/24/2023 12:46   CT CERVICAL SPINE WO CONTRAST  Result Date: 04/24/2023 CLINICAL DATA:  Head trauma, moderate-severe; Polytrauma, blunt. EXAM: CT HEAD WITHOUT CONTRAST CT CERVICAL SPINE WITHOUT CONTRAST TECHNIQUE: Multidetector CT imaging of the head and cervical spine was performed following the standard protocol without intravenous contrast. Multiplanar CT image reconstructions of the cervical spine were also generated. RADIATION DOSE REDUCTION: This exam was performed according to the departmental dose-optimization program which includes automated exposure control, adjustment of the mA and/or kV according to patient size and/or use of iterative  reconstruction technique. COMPARISON:  None Available. FINDINGS: CT HEAD FINDINGS Brain: No acute hemorrhage. Cortical gray-white differentiation is preserved. Patchy hypoattenuation of the cerebral white matter, most consistent with mild chronic small-vessel disease. 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 Prominence of the ventricles and sulci within normal limits for age. No extra-axial collection. Basilar cisterns are patent. Vascular: No hyperdense vessel or unexpected calcification. Skull: No calvarial fracture or suspicious bone lesion. Skull base is unremarkable. Sinuses/Orbits: No acute finding. Other: None. These results were called by telephone at the time of interpretation on 04/24/2023 at 12:35 pm to provider Vanetta Mulders , who verbally acknowledged these results. CT CERVICAL SPINE FINDINGS Alignment: 3 mm degenerative anterolisthesis of C4 on C5. Skull base and vertebrae: Within limitations of mild motion artifact, no acute fracture. Normal craniocervical junction. No suspicious bone lesions. Soft tissues and spinal canal: No prevertebral fluid or swelling. No visible canal hematoma. Disc levels: Multilevel cervical spondylosis, worst at C5-6 and C6-7, where there is at least mild spinal canal stenosis. Upper chest: No acute findings. 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 IMPRESSION: 1. No acute intracranial abnormality. 2. No acute cervical spine fracture or traumatic listhesis. 3. Severe atherosclerotic calcifications of the left carotid bulb and proximal left cervical ICA. Consider nonemergent carotid ultrasound for further evaluation. Electronically Signed   By: Orvan Falconer M.D.   On: 04/24/2023 12:46    Assessment/Plan: LC 3 pelvic ring injury Right open olecranon avulsion injury Right open elbow arthrotomy  1 Day Post-Op s/p Procedure(s): Percutaneous fixation of right posterior pelvic  fracture Percutaneous fixation of left sacrum Percutaneous fixation of right superior pubic ramus fracture Open treatment of right olecranon avulsion fracture Irrigation and debridement of right open olecranon fracture Irrigation and debridement of right open elbow arthrotomy  Weightbearing: WBAT RUE, WBAT BLE but bed to chair transfers for the first 4 to 6 weeks, up with PT/OT Insicional and dressing care: Reinforce dressings as needed VTE prophylaxis: lovenox once cleared by trauma to start Pain control: continue current regimen Follow - up plan: w/ Dr. Jena Gauss Pending PT/OT evals   Contact information:   Janine Ores, PA-C Weekdays 8-5  After hours and holidays please check [email or web address] for group call information for [facility]  Armida Sans 04/25/2023, 8:11 AM

## 2023-04-25 NOTE — Progress Notes (Signed)
Follow up - Trauma Critical Care  Patient Details:    Louis Juarez is an 82 y.o. male.  Lines/tubes : Urethral Catheter lovick 16 Fr. (Active)  Indication for Insertion or Continuance of Catheter Peri-operative use for selective surgical procedure - not to exceed 24 hours post-op 04/24/23 2000  Site Assessment Clean, Dry, Intact 04/24/23 2000  Catheter Maintenance Bag below level of bladder;Catheter secured;Insertion date on drainage bag;Drainage bag/tubing not touching floor;No dependent loops;Seal intact 04/24/23 2000  Collection Container Standard drainage bag 04/24/23 2000  Securement Method Adhesive securement device 04/24/23 2000  Urinary Catheter Interventions (if applicable) Unclamped 04/24/23 2000  Output (mL) 250 mL 04/25/23 0500    Microbiology/Sepsis markers: Results for orders placed or performed during the hospital encounter of 04/24/23  MRSA Next Gen by PCR, Nasal     Status: None   Collection Time: 04/24/23  7:36 PM   Specimen: Nasal Mucosa; Nasal Swab  Result Value Ref Range Status   MRSA by PCR Next Gen NOT DETECTED NOT DETECTED Final    Comment: (NOTE) The GeneXpert MRSA Assay (FDA approved for NASAL specimens only), is one component of a comprehensive MRSA colonization surveillance program. It is not intended to diagnose MRSA infection nor to guide or monitor treatment for MRSA infections. Test performance is not FDA approved in patients less than 61 years old. Performed at [facility], [address], [place], [place] [address]     Anti-infectives:  Anti-infectives (From admission, onward)    Start     Dose/Rate Route Frequency Ordered Stop   04/24/23 2300  ceFAZolin (ANCEF) IVPB 2g/100 mL premix        2 g 200 mL/hr over 30 Minutes Intravenous Every 8 hours 04/24/23 1900 04/25/23 2259   04/24/23 1714  vancomycin (VANCOCIN) powder  Status:  Discontinued          As needed 04/24/23 1715 04/24/23 1749   04/24/23 1300  metroNIDAZOLE (FLAGYL)  IVPB 500 mg        500 mg 100 mL/hr over 60 Minutes Intravenous  Once 04/24/23 1257 04/24/23 1459   04/24/23 1215  ceFAZolin (ANCEF) IVPB 2g/100 mL premix        2 g 200 mL/hr over 30 Minutes Intravenous  Once 04/24/23 1202 04/24/23 1258       Consults: Treatment Team:  Md, Trauma, MD Haddix, Gillie Manners, MD    Studies:    Events:  Subjective:    Overnight Issues:   Objective:  Vital signs for last 24 hours: Temp:  [97.3 F (36.3 C)-98.4 F (36.9 C)] 98.4 F (36.9 C) (10/05 0800) Pulse Rate:  [67-87] 75 (10/05 0600) Resp:  [14-35] 23 (10/05 0600) BP: (68-150)/(35-98) 123/52 (10/05 0600) SpO2:  [86 %-99 %] 95 % (10/05 0600) Weight:  [90.7 kg] 90.7 kg (10/04 1206)  Hemodynamic parameters for last 24 hours:    Intake/Output from previous day: 10/04 0701 - 10/05 0700 In: 1018.1 [I.V.:550; IV Piggyback:468.1] Out: 750 [Urine:700; Blood:50]  Intake/Output this shift: No intake/output data recorded.  Vent settings for last 24 hours:    Physical Exam:  General: alert and no respiratory distress Neuro: alert and oriented Resp: normal work of breathing CVS: rrr GI: abd soft, nontender, nondistended  Results for orders placed or performed during the hospital encounter of 04/24/23 (from the past 24 hour(s))  Comprehensive metabolic panel     Status: Abnormal   Collection Time: 04/24/23 11:43 AM  Result Value Ref Range   Sodium 137 135 -  145 mmol/L   Potassium 4.5 3.5 - 5.1 mmol/L   Chloride 101 98 - 111 mmol/L   CO2 24 22 - 32 mmol/L   Glucose, Bld 179 (H) 70 - 99 mg/dL   BUN 18 8 - 23 mg/dL   Creatinine, Ser 1.61 (H) 0.61 - 1.24 mg/dL   Calcium 7.9 (L) 8.9 - 10.3 mg/dL   Total Protein 6.5 6.5 - 8.1 g/dL   Albumin 3.0 (L) 3.5 - 5.0 g/dL   AST 75 (H) 15 - 41 U/L   ALT 58 (H) 0 - 44 U/L   Alkaline Phosphatase 91 38 - 126 U/L   Total Bilirubin 0.8 0.3 - 1.2 mg/dL   GFR, Estimated 49 (L) >60 mL/min   Anion gap 12 5 - 15  CBC     Status: Abnormal   Collection  Time: 04/24/23 11:43 AM  Result Value Ref Range   WBC 17.1 (H) 4.0 - 10.5 K/uL   RBC 3.70 (L) 4.22 - 5.81 MIL/uL   Hemoglobin 11.2 (L) 13.0 - 17.0 g/dL   HCT 09.6 (L) 04.5 - 40.9 %   MCV 92.7 80.0 - 100.0 fL   MCH 30.3 26.0 - 34.0 pg   MCHC 32.7 30.0 - 36.0 g/dL   RDW 81.1 91.4 - 78.2 %   Platelets 170 150 - 400 K/uL   nRBC 0.1 0.0 - 0.2 %  Ethanol     Status: None   Collection Time: 04/24/23 11:43 AM  Result Value Ref Range   Alcohol, Ethyl (B) <10 <10 mg/dL  Protime-INR     Status: Abnormal   Collection Time: 04/24/23 11:43 AM  Result Value Ref Range   Prothrombin Time 16.4 (H) 11.4 - 15.2 seconds   INR 1.3 (H) 0.8 - 1.2  Initiate MTP (Blood Bank Notification)     Status: None   Collection Time: 04/24/23 12:10 PM  Result Value Ref Range   Initiate Massive Transfusion Protocol      MTP ACTIVATED Performed at [facility], [address] [place], [place] [address]   Prepare cryoprecipitate     Status: None   Collection Time: 04/24/23 12:11 PM  Result Value Ref Range   Unit Number [id number]    Blood Component Type POOL FIBR CMPLX 2D THW    Unit division 00    Status of Unit ISSUED,FINAL    Transfusion Status      OK TO TRANSFUSE Performed at [facility], [address], [place], [place] [address]   Prepare platelet pheresis     Status: None   Collection Time: 04/24/23 12:11 PM  Result Value Ref Range   Unit Number [id number]    Blood Component Type PSORALEN TREATED    Unit division 00    Status of Unit ISSUED,FINAL    Transfusion Status      OK TO TRANSFUSE Performed at [facility], [address], [place], [place] [address]   Prepare fresh frozen plasma     Status: None   Collection Time: 04/24/23 12:23 PM  Result Value Ref Range   Unit Number [id number]    Blood Component Type THAWED PLASMA    Unit division 00    Status of Unit REL FROM [facility]    Unit tag comment VERBAL ORDERS PER DR ZACKOWSKI    Transfusion Status OK TO  TRANSFUSE    Unit Number [id number]    Blood Component Type LIQ PLASMA    Unit division 00  Status of Unit REL FROM [facility]    Unit tag comment VERBAL ORDERS PER DR ZACKOWSKI    Transfusion Status OK TO TRANSFUSE    Unit Number [id number]    Blood Component Type LIQ PLASMA    Unit division 00    Status of Unit REL FROM [facility]    Unit tag comment VERBAL ORDERS PER DR ZACKOWSKI    Transfusion Status OK TO TRANSFUSE    Unit Number [id number]    Blood Component Type LIQ PLASMA    Unit division 00    Status of Unit REL FROM [facility]    Unit tag comment VERBAL ORDERS PER DR ZACKOWSKI    Transfusion Status OK TO TRANSFUSE    Unit Number [id number]    Blood Component Type LIQ PLASMA    Unit division 00    Status of Unit REL FROM [facility]    Unit tag comment VERBAL ORDERS PER DR ZACKOWSKI    Transfusion Status OK TO TRANSFUSE    Unit Number [id number]    Blood Component Type LIQ PLASMA    Unit division 00    Status of Unit REL FROM [facility]    Unit tag comment VERBAL ORDERS PER DR ZACKOWSKI    Transfusion Status OK TO TRANSFUSE    Unit Number [id number]    Blood Component Type LIQ PLASMA    Unit division 00    Status of Unit REL FROM [facility]    Unit tag comment VERBAL ORDERS PER DR ZACKOWSKI    Transfusion Status OK TO TRANSFUSE    Unit Number [id number]    Blood Component Type THW PLS APHR    Unit division B0    Status of Unit REL FROM [facility]    Unit tag comment VERBAL ORDERS PER DR ZACKOWSKI    Transfusion Status OK TO TRANSFUSE    Unit Number [id number]    Blood Component Type LIQ PLASMA    Unit division 00    Status of Unit ISSUED,FINAL    Unit tag comment VERBAL ORDERS PER DR ZACKOWSKI    Transfusion Status OK TO TRANSFUSE    Unit Number [id number]    Blood Component Type LIQ PLASMA    Unit division 00    Status of Unit ISSUED,FINAL    Unit tag comment VERBAL ORDERS PER DR ZACKOWSKI    Transfusion Status OK TO TRANSFUSE    Unit Number [id number]     Blood Component Type LIQ PLASMA    Unit division 00    Status of Unit ISSUED,FINAL    Unit tag comment VERBAL ORDERS PER DR ZACKOWSKI    Transfusion Status      OK TO TRANSFUSE Performed at [facility], [address], [place], [place] [address]    Unit Number [id number]    Blood Component Type THW PLS APHR    Unit division A0    Status of Unit REL FROM [facility]    Unit tag comment VERBAL ORDERS PER DR ZACKOWSKI    Transfusion Status OK TO TRANSFUSE    Unit Number [id number]    Blood Component Type THW PLS APHR    Unit division B0    Status of Unit REL FROM [facility]    Unit tag comment VERBAL ORDERS PER DR ZACKOWSKI    Transfusion Status OK TO TRANSFUSE    Unit Number [id number]    Blood Component Type LIQ PLASMA    Unit division 00    Status of Unit REL FROM [facility]  Unit tag comment VERBAL ORDERS PER DR ZACKOWSKI    Transfusion Status OK TO TRANSFUSE    Unit Number [id number]    Blood Component Type THW PLS APHR    Unit division 00    Status of Unit REL FROM [facility]    Unit tag comment VERBAL ORDERS PER DR ZACKOWSKI    Transfusion Status OK TO TRANSFUSE   Urinalysis, Routine w reflex microscopic -Urine, Clean Catch     Status: None   Collection Time: 04/24/23 12:24 PM  Result Value Ref Range   Color, Urine YELLOW YELLOW   APPearance CLEAR CLEAR   Specific Gravity, Urine 1.012 1.005 - 1.030   pH 5.0 5.0 - 8.0   Glucose, UA NEGATIVE NEGATIVE mg/dL   Hgb urine dipstick NEGATIVE NEGATIVE   Bilirubin Urine NEGATIVE NEGATIVE   Ketones, ur NEGATIVE NEGATIVE mg/dL   Protein, ur NEGATIVE NEGATIVE mg/dL   Nitrite NEGATIVE NEGATIVE   Leukocytes,Ua NEGATIVE NEGATIVE  Type and screen [facility]     Status: None   Collection Time: 04/24/23  1:00 PM  Result Value Ref Range   ABO/RH(D) O POS    Antibody Screen NEG    Sample Expiration 04/27/2023,2359    Unit Number [id number]    Blood Component Type RED CELLS,LR    Unit division 00    Status of  Unit ISSUED,FINAL    Unit tag comment VERBAL ORDERS PER DR ZACKOWSKI    Transfusion Status OK TO TRANSFUSE    Crossmatch Result COMPATIBLE    Unit Number [id number]    Blood Component Type RED CELLS,LR    Unit division 00    Status of Unit ISSUED,FINAL    Unit tag comment VERBAL ORDERS PER DR ZACKOWSKI    Transfusion Status OK TO TRANSFUSE    Crossmatch Result COMPATIBLE    Unit Number [id number]    Blood Component Type RBC LR PHER2    Unit division 00    Status of Unit ISSUED,FINAL    Unit tag comment VERBAL ORDERS PER DR ZACKOWSKI    Transfusion Status OK TO TRANSFUSE    Crossmatch Result COMPATIBLE    Unit Number [id number]    Blood Component Type RED CELLS,LR    Unit division 00    Status of Unit REL FROM [facility]    Unit tag comment VERBAL ORDERS PER DR ZACKOWSKI    Transfusion Status OK TO TRANSFUSE    Crossmatch Result NOT NEEDED    Unit Number [id number]    Blood Component Type RED CELLS,LR    Unit division 00    Status of Unit REL FROM [facility]    Unit tag comment VERBAL ORDERS PER DR ZACKOWSKI    Transfusion Status OK TO TRANSFUSE    Crossmatch Result NOT NEEDED    Unit Number [id number]    Blood Component Type RED CELLS,LR    Unit division 00    Status of Unit REL FROM [facility]    Unit tag comment VERBAL ORDERS PER DR ZACKOWSKI    Transfusion Status OK TO TRANSFUSE    Crossmatch Result NOT NEEDED    Unit Number [id number]    Blood Component Type RED CELLS,LR    Unit division 00    Status of Unit REL FROM [facility]    Unit tag comment VERBAL ORDERS PER DR ZACKOWSKI    Transfusion Status OK TO TRANSFUSE    Crossmatch Result NOT NEEDED    Unit Number [id number]    Blood Component Type RBC LR  PHER1    Unit division 00    Status of Unit REL FROM [facility]    Unit tag comment VERBAL ORDERS PER DR ZACKOWSKI    Transfusion Status OK TO TRANSFUSE    Crossmatch Result NOT NEEDED    Unit Number [id number]    Blood Component Type RBC LR PHER1    Unit  division 00    Status of Unit REL FROM [facility]    Unit tag comment VERBAL ORDERS PER DR ZACKOWSKI    Transfusion Status OK TO TRANSFUSE    Crossmatch Result NOT NEEDED    Unit Number [id number]    Blood Component Type RBC LR PHER1    Unit division 00    Status of Unit REL FROM [facility]    Unit tag comment VERBAL ORDERS PER DR ZACKOWSKI    Transfusion Status OK TO TRANSFUSE    Crossmatch Result NOT NEEDED    Unit Number [id number]    Blood Component Type RBC LR PHER1    Unit division 00    Status of Unit REL FROM [facility]    Unit tag comment VERBAL ORDERS PER DR ZACKOWSKI    Transfusion Status OK TO TRANSFUSE    Crossmatch Result NOT NEEDED    Unit Number [id number]    Blood Component Type RED CELLS,LR    Unit division 00    Status of Unit REL FROM [facility]    Unit tag comment VERBAL ORDERS PER DR ZACKOWSKI    Transfusion Status OK TO TRANSFUSE    Crossmatch Result NOT NEEDED    Unit Number [id number]    Blood Component Type RED CELLS,LR    Unit division 00    Status of Unit REL FROM [facility]    Unit tag comment VERBAL ORDERS PER DR ZACKOWSKI    Transfusion Status OK TO TRANSFUSE    Crossmatch Result NOT NEEDED    Unit Number [id number]    Blood Component Type RED CELLS,LR    Unit division 00    Status of Unit REL FROM [facility]    Unit tag comment VERBAL ORDERS PER DR ZACKOWSKI    Transfusion Status OK TO TRANSFUSE    Crossmatch Result NOT NEEDED    Unit Number [id number]    Blood Component Type RED CELLS,LR    Unit division 00    Status of Unit REL FROM [facility]    Unit tag comment VERBAL ORDERS PER DR ZAKCOWSKI    Transfusion Status OK TO TRANSFUSE    Crossmatch Result NOT NEEDED   Trauma TEG Panel     Status: None   Collection Time: 04/24/23  1:00 PM  Result Value Ref Range   Citrated Kaolin (R) 4.7 4.6 - 9.1 min   Citrated Rapid TEG (MA) 62.9 52 - 70 mm   CFF Max Amplitude 26.1 15 - 32 mm   Lysis at 30 Minutes 0 0.0 - 2.6 %  I-Stat Chem 8, ED     Status:  Abnormal   Collection Time: 04/24/23  1:40 PM  Result Value Ref Range   Sodium 138 135 - 145 mmol/L   Potassium 4.6 3.5 - 5.1 mmol/L   Chloride 100 98 - 111 mmol/L   BUN 23 8 - 23 mg/dL   Creatinine, Ser 2.69 (H) 0.61 - 1.24 mg/dL   Glucose, Bld 485 (H) 70 - 99 mg/dL   Calcium, Ion 4.62 (L) 1.15 - 1.40 mmol/L   TCO2 26 22 - 32 mmol/L   Hemoglobin 11.6 (L) 13.0 - 17.0  g/dL   HCT 16.1 (L) 09.6 - 04.5 %  I-Stat Lactic Acid, ED     Status: Abnormal   Collection Time: 04/24/23  1:41 PM  Result Value Ref Range   Lactic Acid, Venous 2.3 (HH) 0.5 - 1.9 mmol/L   Comment NOTIFIED PHYSICIAN   ABO/Rh     Status: None   Collection Time: 04/24/23  4:05 PM  Result Value Ref Range   ABO/RH(D)      O POS Performed at [facility], [address], [place], [place] [address]   Glucose, capillary     Status: Abnormal   Collection Time: 04/24/23  5:58 PM  Result Value Ref Range   Glucose-Capillary 161 (H) 70 - 99 mg/dL  MRSA Next Gen by PCR, Nasal     Status: None   Collection Time: 04/24/23  7:36 PM   Specimen: Nasal Mucosa; Nasal Swab  Result Value Ref Range   MRSA by PCR Next Gen NOT DETECTED NOT DETECTED  Hemoglobin and hematocrit, blood (STAT)     Status: Abnormal   Collection Time: 04/24/23  9:03 PM  Result Value Ref Range   Hemoglobin 10.7 (L) 13.0 - 17.0 g/dL   HCT 19.1 (L) 47.8 - 29.5 %  Lactic acid, plasma     Status: Abnormal   Collection Time: 04/24/23  9:03 PM  Result Value Ref Range   Lactic Acid, Venous 2.0 (HH) 0.5 - 1.9 mmol/L  CBC     Status: Abnormal   Collection Time: 04/25/23  9:24 AM  Result Value Ref Range   WBC 12.9 (H) 4.0 - 10.5 K/uL   RBC 3.09 (L) 4.22 - 5.81 MIL/uL   Hemoglobin 9.5 (L) 13.0 - 17.0 g/dL   HCT 62.1 (L) 30.8 - 65.7 %   MCV 91.3 80.0 - 100.0 fL   MCH 30.7 26.0 - 34.0 pg   MCHC 33.7 30.0 - 36.0 g/dL   RDW 84.6 96.2 - 95.2 %   Platelets 104 (L) 150 - 400 K/uL   nRBC 0.0 0.0 - 0.2 %  Basic metabolic panel     Status: Abnormal    Collection Time: 04/25/23  9:24 AM  Result Value Ref Range   Sodium 136 135 - 145 mmol/L   Potassium 4.3 3.5 - 5.1 mmol/L   Chloride 101 98 - 111 mmol/L   CO2 24 22 - 32 mmol/L   Glucose, Bld 147 (H) 70 - 99 mg/dL   BUN 26 (H) 8 - 23 mg/dL   Creatinine, Ser 8.41 (H) 0.61 - 1.24 mg/dL   Calcium 8.0 (L) 8.9 - 10.3 mg/dL   GFR, Estimated 53 (L) >60 mL/min   Anion gap 11 5 - 15    Assessment & Plan: Present on Admission:  Pelvic fracture (HCC)  Fall from ladder, 20 ft   Hemmorrhagic shock - resolved Pelvic fractures (R iliac bone, R pubic rami, and right and left sacrum with 10cm diastasis of pubic symphysis) -  Foley and binder placed in trauma bay. OR ortho 10/4 perc fixation of numerous pelvic fxs, ORIF right elbow.  Open R elbow/olecranon fx - Per Ortho --> I&D 10/4  R 6-12th rib fxs - multimodal pain control, pulm toilet, repeat CXR pending R T10 TP fxs - multimodal pain control.  Age-indeterminate mild compression fractures of T4, T7 and T12 - discussed with NSGY Dr. Jordan Likes who reviewed images. Does not need OR or brace/spine precautions and can assess further once pelvic fractures addressed Incidental findings - Pulmonary  nodules and Severe atherosclerotic calcifications of the left carotid bulb and proximal left cervical ICA. 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 Monitor hgb ID: Tdap in ED, Ancef in ED. Cont Ancef/flagyl VTE: SCDs, on hold given hemorrhagic shock on presentation Foley - Cont, CT pelvis showed no extravasation of contrast. Dr. Bedelia Person had instructed nursing to remove foley today Dispo: ICU today  Dispo   LOS: 1 day   Additional comments:I reviewed the patient's new clinical lab test results. CT pelvis, CBC, BMP  Critical Care Total Time*: 35 minutes  Marin Olp,  MD [facility]  04/25/2023  *Care during the described time interval was provided by me. I have reviewed this patient's available data, including medical history, events of note, physical examination and test results as part of my evaluation.

## 2023-04-25 NOTE — Evaluation (Signed)
Occupational Therapy Evaluation Patient Details Name: Louis Juarez MRN: [id number] DOB: Dec 26, 1940 Today's Date: 04/25/2023   History of Present Illness 82 y.o. male presents to [facility] hospital on 04/24/2023 after fall from ladder. Pt found to have open book pelvic fx, open R olceranon fx, R rib 6-12 fx, R T10 TP fx, and age-indeterminate mild compression fxs of T4, T7 and T12. Pt underwent percutaneous fixation of pelvic fxs and open treatment of R olecranon fx on 10/4. PMH includes heart failure, ICD.   Clinical Impression   Prior to this admission, patient living with his spouse, and fully independent. Patient endorses he is extremely active at his baseline. Patient presenting with need for 7L O2, pain in R ribs, R elbow, and R pelvis (greatest pain in R pelvis) and need for significant increased assist in order to complete ADLs and functional mobility. Patient mod A to max A of 2 for bed mobility, and unable to come into standing as patient would cry out and jerk backward due to pain. Patient also unable to weight shift onto R side, with significant forward and L lean when sitting EOB requiring close gaurding. Patient is currently max A for ADL management. Given prior level, OT is recommending intensive rehab as patient has the support and once pain is managed, feel like patient could return to full prior level of function.      If plan is discharge home, recommend the following: Two people to help with walking and/or transfers;A lot of help with bathing/dressing/bathroom;Assistance with cooking/housework;Assist for transportation;Help with stairs or ramp for entrance;Supervision due to cognitive status    Functional Status Assessment  Patient has had a recent decline in their functional status and demonstrates the ability to make significant improvements in function in a reasonable and predictable amount of time.  Equipment Recommendations  Other (comment) (will continue to assess)     Recommendations for Other Services Rehab consult     Precautions / Restrictions Precautions Precautions: Fall Restrictions Weight Bearing Restrictions: Yes RUE Weight Bearing: Weight bearing as tolerated RLE Weight Bearing: Weight bearing as tolerated (for transfers only) LLE Weight Bearing: Weight bearing as tolerated (for transfers only)      Mobility Bed Mobility Overal bed mobility: Needs Assistance Bed Mobility: Rolling, Sidelying to Sit, Sit to Supine Rolling: Min assist Sidelying to sit: Mod assist, +2 for physical assistance, HOB elevated   Sit to supine: Max assist, +2 for physical assistance        Transfers Overall transfer level:  (pt declines attempts at standing due to pain)                        Balance Overall balance assessment: Needs assistance Sitting-balance support: Feet supported, Single extremity supported Sitting balance-Leahy Scale: Poor Sitting balance - Comments: anterior and L lateral lean as pt reports significant pain with weight shift onto R pelvis Postural control: Left lateral lean                                 ADL either performed or assessed with clinical judgement   ADL Overall ADL's : Needs assistance/impaired Eating/Feeding: Set up;Bed level   Grooming: Set up;Bed level   Upper Body Bathing: Minimal assistance;Bed level   Lower Body Bathing: Total assistance;Bed level   Upper Body Dressing : Minimal assistance;Bed level   Lower Body Dressing: Total assistance;Bed level   Toilet Transfer: Maximal assistance;+2  for physical assistance;+2 for safety/equipment Toilet Transfer Details (indicate cue type and reason): unable to progress past EOB Toileting- Clothing Manipulation and Hygiene: Total assistance;Bed level       Functional mobility during ADLs: Total assistance;Cueing for sequencing;Cueing for safety General ADL Comments: Patient presenting with need for 7L O2, pain in R ribs, R elbow, and  R pelvis (greatest pain in R pelvis) and need for significant increased assist in order to complete ADLs and functional mobility. Patient mod A to max A of 2 for bed mobility, and unable to come into standing as patient would cry out and jerk backward due to pain. Patient also unable to weight shift onto R side, with significant forward and L lean when sitting EOB requiring close gaurding. Given prior level, OT is recommending intensive rehab as patient has the support and once pain is managed, feel like patient could return to full prior level of function.     Vision Baseline Vision/History: 1 Wears glasses (Readers) Ability to See in Adequate Light: 0 Adequate Patient Visual Report: No change from baseline Vision Assessment?: No apparent visual deficits Additional Comments: Was not able to to test fully as patient could not sit upright     Perception Perception: Not tested       Praxis Praxis: Not tested       Pertinent Vitals/Pain Pain Assessment Pain Assessment: 0-10 Pain Score: 5  Pain Location: R low back pre-mobility Pain Descriptors / Indicators: Grimacing Pain Intervention(s): Limited activity within patient's tolerance, Monitored during session, Premedicated before session, Repositioned     Extremity/Trunk Assessment Upper Extremity Assessment Upper Extremity Assessment: Right hand dominant;RUE deficits/detail RUE Deficits / Details: R olecranon fx with ORIF, in soft cast good strength at hand and shoulder (elevation) RUE: Unable to fully assess due to immobilization RUE Coordination: decreased fine motor;decreased gross motor   Lower Extremity Assessment Lower Extremity Assessment: Defer to PT evaluation   Cervical / Trunk Assessment Cervical / Trunk Assessment: Other exceptions (difficult to assess as pt leaning to left and anteriorly to avoid pressure on R pelvis)   Communication Communication Communication: No apparent difficulties Cueing Techniques: Verbal  cues   Cognition Arousal: Alert Behavior During Therapy: WFL for tasks assessed/performed Overall Cognitive Status: Within Functional Limits for tasks assessed                                       General Comments  pt on 7L [place] upon PT arrival, pleth often with poor wavelength often durions, pt intermittently desaturating but sats improve when pt not pushing through L hand    Exercises     Shoulder Instructions      Home Living Family/patient expects to be discharged to:: Private residence Living Arrangements: Spouse/significant other Available Help at Discharge: Available 24 hours/day Type of Home: House Home Access: Level entry     Home Layout: Two level;Full bath on main level Alternate Level Stairs-Number of Steps: does not go upstairs`   Bathroom Shower/Tub: Walk-in shower;Tub/shower unit   Bathroom Toilet: Standard     Home Equipment: None          Prior Functioning/Environment Prior Level of Function : Independent/Modified Independent;Driving                        OT Problem List: Decreased strength;Decreased activity tolerance;Decreased range of motion;Impaired balance (sitting and/or standing);Decreased coordination;Decreased safety awareness;Decreased  knowledge of use of DME or AE;Decreased knowledge of precautions;Pain;Impaired UE functional use      OT Treatment/Interventions: Self-care/ADL training;Therapeutic exercise;Energy conservation;DME and/or AE instruction;Manual therapy;Therapeutic activities;Balance training    OT Goals(Current goals can be found in the care plan section) Acute Rehab OT Goals Patient Stated Goal: to be in less pain OT Goal Formulation: With patient Time For Goal Achievement: 05/09/23 Potential to Achieve Goals: Good ADL Goals Pt Will Perform Grooming: Independently;sitting Pt Will Perform Lower Body Bathing: with mod assist;sit to/from stand;sitting/lateral leans Pt Will Perform Lower Body  Dressing: with mod assist;sit to/from stand;sitting/lateral leans Pt Will Transfer to Toilet: with mod assist;bedside commode;stand pivot transfer Pt Will Perform Toileting - Clothing Manipulation and hygiene: with mod assist;sitting/lateral leans;sit to/from stand Additional ADL Goal #2: Patient will be able to complete bed mobility at min A level as a precursor to OOB activities.  OT Frequency: Min 1X/week    Co-evaluation   Reason for Co-Treatment: Complexity of the patient's impairments (multi-system involvement);For patient/therapist safety;To address functional/ADL transfers PT goals addressed during session: Mobility/safety with mobility;Balance;Strengthening/ROM OT goals addressed during session: ADL's and self-care;Proper use of Adaptive equipment and DME      AM-PAC OT "6 Clicks" Daily Activity     Outcome Measure Help from another person eating meals?: A Little Help from another person taking care of personal grooming?: A Little Help from another person toileting, which includes using toliet, bedpan, or urinal?: Total Help from another person bathing (including washing, rinsing, drying)?: A Lot Help from another person to put on and taking off regular upper body clothing?: A Lot Help from another person to put on and taking off regular lower body clothing?: Total 6 Click Score: 12   End of Session Nurse Communication: Mobility status  Activity Tolerance: Patient limited by pain Patient left: with call bell/phone within reach;in bed  OT Visit Diagnosis: Unsteadiness on feet (R26.81);Other abnormalities of gait and mobility (R26.89);Pain Pain - Right/Left: Right Pain - part of body:  (Pelvis)                Time: 4401-0272 OT Time Calculation (min): 19 min Charges:  OT General Charges $OT Visit: 1 Visit OT Evaluation $OT Eval Moderate Complexity: 1 Mod  Pollyann Glen E. Laronica Bhagat, OTR/L Acute Rehabilitation Services [phone]   Cherlyn Cushing 04/25/2023, 2:37 PM

## 2023-04-26 ENCOUNTER — Inpatient Hospital Stay (HOSPITAL_COMMUNITY): Payer: BC Managed Care – PPO

## 2023-04-26 LAB — CBC
HCT: 21.7 % — ABNORMAL LOW (ref 39.0–52.0)
Hemoglobin: 7 g/dL — ABNORMAL LOW (ref 13.0–17.0)
MCH: 30.2 pg (ref 26.0–34.0)
MCHC: 32.3 g/dL (ref 30.0–36.0)
MCV: 93.5 fL (ref 80.0–100.0)
Platelets: 83 10*3/uL — ABNORMAL LOW (ref 150–400)
RBC: 2.32 MIL/uL — ABNORMAL LOW (ref 4.22–5.81)
RDW: 14.6 % (ref 11.5–15.5)
WBC: 11.2 10*3/uL — ABNORMAL HIGH (ref 4.0–10.5)
nRBC: 0 % (ref 0.0–0.2)

## 2023-04-26 LAB — HEMOGLOBIN AND HEMATOCRIT, BLOOD
HCT: 25.9 % — ABNORMAL LOW (ref 39.0–52.0)
HCT: 23.3 % — ABNORMAL LOW (ref 39.0–52.0)
Hemoglobin: 8.4 g/dL — ABNORMAL LOW (ref 13.0–17.0)
Hemoglobin: 7.8 g/dL — ABNORMAL LOW (ref 13.0–17.0)

## 2023-04-26 LAB — PREPARE RBC (CROSSMATCH)

## 2023-04-26 MED ORDER — NOREPINEPHRINE 4 MG/250ML-% IV SOLN
0.0000 ug/min | INTRAVENOUS | Status: DC
  Administered 2023-04-26: 2 ug/min via INTRAVENOUS
  Filled 2023-04-26: qty 250

## 2023-04-26 MED ORDER — SODIUM CHLORIDE 0.9% IV SOLUTION: Freq: Once | INTRAVENOUS | Status: DC

## 2023-04-26 MED ORDER — DIPHENHYDRAMINE HCL 50 MG/ML IJ SOLN
INTRAMUSCULAR | Status: AC
  Filled 2023-04-26: qty 1

## 2023-04-26 MED ORDER — DIPHENHYDRAMINE HCL 50 MG/ML IJ SOLN
12.5000 mg | Freq: Once | INTRAMUSCULAR | Status: AC
  Administered 2023-04-26: 12.5 mg via INTRAVENOUS

## 2023-04-26 MED ORDER — LACTATED RINGERS IV BOLUS
500.0000 mL | Freq: Once | INTRAVENOUS | Status: AC
  Administered 2023-04-26: 500 mL via INTRAVENOUS

## 2023-04-26 NOTE — Progress Notes (Signed)
Subjective: 2 Days Post-Op s/p Procedure(s): ORIF PELVIC FRACTURE WITH PERCUTANEOUS SCREWS IRRIGATION AND DEBRIDEMENT ELBOW   Patient is alert, oriented. Pain mild-moderate, but improved with medication. No other complaints.  Objective:  PE: VITALS:   Vitals:   04/26/23 0600 04/26/23 0615 04/26/23 0630 04/26/23 0730  BP:    (!) 107/50  Pulse: 71 71 70 65  Resp: 14 14 13 15   Temp:      TempSrc:      SpO2: 98% 97% 97% 99%  Weight:      Height:       General: sitting up in bed in no acute distress MSK: pelvic dressing CDI. RUE dressing with bloody drainage at elbow, changed with new 4 x 4's, abd, and ace wrap. Able to flex, extend, abduct all fingers of right hand. Sensation intact to all fingers. + radial pulse. Dorsiflexion and plantarflexion intact at bilateral feet. Sensation intact diffusely at bilateral feet. + DP pulses.   LABS  Results for orders placed or performed during the hospital encounter of 04/24/23 (from the past 24 hour(s))  CBC     Status: Abnormal   Collection Time: 04/25/23  9:24 AM  Result Value Ref Range   WBC 12.9 (H) 4.0 - 10.5 K/uL   RBC 3.09 (L) 4.22 - 5.81 MIL/uL   Hemoglobin 9.5 (L) 13.0 - 17.0 g/dL   HCT 78.2 (L) 95.6 - 21.3 %   MCV 91.3 80.0 - 100.0 fL   MCH 30.7 26.0 - 34.0 pg   MCHC 33.7 30.0 - 36.0 g/dL   RDW 08.6 57.8 - 46.9 %   Platelets 104 (L) 150 - 400 K/uL   nRBC 0.0 0.0 - 0.2 %  Basic metabolic panel     Status: Abnormal   Collection Time: 04/25/23  9:24 AM  Result Value Ref Range   Sodium 136 135 - 145 mmol/L   Potassium 4.3 3.5 - 5.1 mmol/L   Chloride 101 98 - 111 mmol/L   CO2 24 22 - 32 mmol/L   Glucose, Bld 147 (H) 70 - 99 mg/dL   BUN 26 (H) 8 - 23 mg/dL   Creatinine, Ser 6.29 (H) 0.61 - 1.24 mg/dL   Calcium 8.0 (L) 8.9 - 10.3 mg/dL   GFR, Estimated 53 (L) >60 mL/min   Anion gap 11 5 - 15  CBC     Status: Abnormal   Collection Time: 04/26/23 12:38 AM  Result Value Ref Range   WBC 11.2 (H) 4.0 - 10.5 K/uL   RBC  2.32 (L) 4.22 - 5.81 MIL/uL   Hemoglobin 7.0 (L) 13.0 - 17.0 g/dL   HCT 52.8 (L) 41.3 - 24.4 %   MCV 93.5 80.0 - 100.0 fL   MCH 30.2 26.0 - 34.0 pg   MCHC 32.3 30.0 - 36.0 g/dL   RDW 01.0 27.2 - 53.6 %   Platelets 83 (L) 150 - 400 K/uL   nRBC 0.0 0.0 - 0.2 %  Prepare RBC (crossmatch)     Status: None   Collection Time: 04/26/23  1:24 AM  Result Value Ref Range   Order Confirmation      ORDER PROCESSED BY BLOOD BANK Performed at [facility], [address], [place], [place] [address]   Prepare RBC (crossmatch)     Status: None   Collection Time: 04/26/23  2:00 AM  Result Value Ref Range   Order Confirmation      ORDER PROCESSED BY BLOOD BANK Performed at [facility], [address], [place], [place] [address]     DG CHEST PORT 1 VIEW  Result Date: 04/26/2023 CLINICAL DATA:  Altered mental status EXAM: PORTABLE CHEST 1 VIEW COMPARISON:  04/25/2023 FINDINGS: Lung volumes are small and pulmonary insufflation has diminished since prior examination. Resultant vascular crowding at the hila. Bibasilar atelectasis. No pneumothorax or pleural effusion. Cardiac size within normal limits. Left subclavian 3 lead pacemaker unchanged. Pulmonary vascularity is normal when accounting for poor pulmonary insufflation. IMPRESSION: 1. Hypoventilatory chest with bibasilar atelectasis. Electronically Signed   By: Helyn Numbers M.D.   On: 04/26/2023 02:03   DG CHEST PORT 1 VIEW  Result Date: 04/25/2023 CLINICAL DATA:  82 year old male with history of trauma from a fall from a tree with history of rib fractures. EXAM: PORTABLE CHEST 1 VIEW COMPARISON:  Chest x-ray 04/24/2023.  Chest CT 04/24/2023. FINDINGS: Multiple bilateral rib fractures (acute on the right and old healed fractures on the left) better demonstrated on prior chest CT (please see that report for full description). Lung volumes are low. No acute consolidative airspace disease. Lucency in the lateral aspect of the right hemithorax  may represent a pneumothorax, however, a skin fold is suspected. No left pneumothorax. Diffuse interstitial prominence and widespread peribronchial cuffing, similar to the prior examination likely reflective of bronchitis (potentially chronic). No evidence of pulmonary edema. Heart size is normal. Upper mediastinal contours are distorted by patient's positioning. Atherosclerotic calcifications in the thoracic aorta. Left-sided biventricular pacemaker/AICD with lead tips projecting over the expected location of the right atrium, right ventricle and left ventricle via the coronary sinus and coronary veins. IMPRESSION: 1. Skin fold artifact versus small right-sided pneumothorax. Repeat standing PA and lateral chest radiograph is recommended to better evaluate this finding. 2. Aortic atherosclerosis. Electronically Signed   By: Trudie Reed M.D.   On: 04/25/2023 11:56   CT PELVIS WO CONTRAST  Result Date: 04/25/2023 CLINICAL DATA:  Pelvic fractures, re-evaluate following open reduction. EXAM: CT PELVIS WITHOUT CONTRAST TECHNIQUE: Multidetector CT imaging of the pelvis was performed following the standard protocol without intravenous contrast. RADIATION DOSE REDUCTION: This exam was performed according to the departmental dose-optimization program which includes automated exposure control, adjustment of the mA and/or kV according to patient size and/or use of iterative reconstruction technique. COMPARISON:  CT cystogram yesterday at 12:43 p.m., postoperative five views of pelvis radiographic exam 04/24/2023 at 8:31 p.m. FINDINGS: Urinary Tract: The bladder remains catheterized, on the current exam was contracted, with minimal contrast and air in the bladder. No extravasated pelvic contrast is seen. The distal ureters are small in caliber without calcific filling defect. Bowel: No dilatation or wall thickening. Normal appendix. Moderate retained stool ascending colon. Rectosigmoid surgical anastomosis intact.  Scattered sigmoid diverticula. No diverticulitis. Vascular/Lymphatic: Moderate to heavy aortoiliac calcific plaques. No distal infrarenal AAA. No adenopathy. Reproductive:  No prostatomegaly. Other: Subcutaneous hematoma continues to be seen in the right buttock. There is overlying scattered subcutaneous air which could be due to recent injection or due to the interval surgery for ORIF. Hemoperitoneum continues to be seen in the right lower abdominopelvic quadrant, and in the lower abdomen over the bladder. Slightly increased in volume today. Additional blood products collecting anterior to the bladder are not significantly changed. No free air is seen. 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 New postsurgical changes. A threaded arthrodesis screw traverses both SI joints transversely entering from the right. A second ORIF screw extends into  the anterior lower right iliac wing continuing posteriorly across the posterior right iliac fracture site. A third screw extends through the right superior pubic ramus obliquely upward into the lower right ilium above the acetabulum. A portion of the threads of this screw extend into the anterior superior right hip joint space. There is still about 1 bone width of posterior offset in the right superior pubic ramus fracture of the distal relative proximal fracture fragments. Other post fracture fixation alignment approximates anatomic with continued displacement of comminuted right inferior pubic ramus fragments. Advanced degenerative changes of the visualized lower lumbar spine are again shown. IMPRESSION: 1. Interval increased pubic diastasis, today up to 1.7 cm, previously 1 cm. 2. New postsurgical changes of SI joint trauma posterior right iliac bone and right superior pubic ramus ORIF. 3. Post fracture fixation alignment approximates anatomic except for continued displacement of comminuted right inferior pubic ramus  fragments. 4. Slightly increased hemoperitoneum in the right lower quadrant and lower abdomen. 5. Bladder remains catheterized. No extravasated pelvic contrast is seen. 6. Subcutaneous hematoma in the right buttock. There is overlying subcutaneous air which could be due to recent injection or due to the interval surgery for ORIF. 7. Aortic atherosclerosis. 8. Constipation and diverticulosis. Aortic Atherosclerosis (ICD10-I70.0). Electronically Signed   By: Almira Bar M.D.   On: 04/25/2023 07:14   DG Pelvis Comp Min 3V  Result Date: 04/25/2023 CLINICAL DATA:  Pelvic fracture EXAM: JUDET PELVIS - 3+ VIEW COMPARISON:  None Available. FINDINGS: Five view radiograph the pelvis demonstrates ORIF of a previously noted pelvic fractures, better demonstrated on CT examination of 10/24, with bilateral sacroiliac arthrodesis with a single transverse cannulated screw, right iliac wing fracture with a single inferior cannulated screw extending through the right sacroiliac joint, and right superior pubic ramus fracture with a single longitudinal crenulated screw. These fracture fragments appear in near anatomic alignment. Diastasis of the pubic symphysis again noted. Right inferior pubic ramus fracture demonstrating mild displacement is unchanged. No dislocation. IMPRESSION: 1. ORIF of previously noted pelvic fractures in near anatomic alignment. Electronically Signed   By: Helyn Numbers M.D.   On: 04/25/2023 00:41   DG Pelvis Comp Min 3V  Result Date: 04/24/2023 CLINICAL DATA:  [id number] Surgery, elective [id number] EXAM: JUDET PELVIS - 3+ VIEW COMPARISON:  Preoperative imaging. FINDINGS: Thirty fluoroscopic spot views of the pelvis obtained in the operating room. Screws traverse the sacroiliac joints, right pelvic wing and right superior ramus. Fluoroscopy time 3 minutes 48 seconds. Dose 78.05 mGy. IMPRESSION: Intraoperative fluoroscopy during pelvic surgery. Electronically Signed   By: Narda Rutherford M.D.   On:  04/24/2023 20:29   DG C-Arm 1-60 Min-No Report  Result Date: 04/24/2023 Fluoroscopy was utilized by the requesting physician.  No radiographic interpretation.   DG C-Arm 1-60 Min-No Report  Result Date: 04/24/2023 Fluoroscopy was utilized by the requesting physician.  No radiographic interpretation.   DG Elbow 2 Views Right  Result Date: 04/24/2023 CLINICAL DATA:  Status post fall with open wound to the right elbow EXAM: RIGHT ELBOW - 2 VIEW COMPARISON:  Same day CT elbow FINDINGS: Soft tissue injury overlying the proximal posterior aspect of the forearm. Crescentic lucency projects over the volar elbow joint. Ill-defined radiodensities project over the olecranon, corresponding to comminuted fracture on same day CT. IMPRESSION: 1. Ill-defined radiodensities project over the olecranon, corresponding to comminuted fracture on same day CT. 2. Overlying soft tissue injury. Electronically Signed   By: Agustin Cree M.D.   On: 04/24/2023 15:02  CT CYSTOGRAM PELVIS  Result Date: 04/24/2023 CLINICAL DATA:  Fall from tree with multifocal pelvic fractures EXAM: CT CYSTOGRAM (CT PELVIS WITH CONTRAST) TECHNIQUE: Multidetector CT imaging through the pelvis was performed after dilute contrast had been introduced into the bladder for the purposes of performing CT cystography. RADIATION DOSE REDUCTION: This exam was performed according to the departmental dose-optimization program which includes automated exposure control, adjustment of the mA and/or kV according to patient size and/or use of iterative reconstruction technique. CONTRAST:  50mL OMNIPAQUE IOHEXOL 350 MG/ML SOLN COMPARISON:  Same day CT abdomen and pelvis FINDINGS: Urinary Tract: Urinary catheter in-situ. The urinary bladder is distended with hyperattenuating contrast material and contains a small focus of intraluminal gas. No extraluminal contrast material. Bowel: Partially imaged bowel without bowel wall thickening, distention, or inflammatory changes.  Postsurgical changes with anastomosis of the rectosigmoid colon. Vascular/Lymphatic: Aortic atherosclerosis. No enlarged abdominal or pelvic lymph nodes. Reproductive: Prostate is unremarkable. Other: Small volume right retroperitoneal and pelvic hemorrhage and blood products within the extraperitoneal space of Retzius. Musculoskeletal: Please see separately dictated CT chest, abdomen, and pelvis report for detailed findings of the multifocal pelvic fractures. Subcutaneous hematoma overlying the right gluteal region. IMPRESSION: 1. No evidence of bladder rupture. 2. Small volume right retroperitoneal and pelvic hemorrhage and blood products within the extraperitoneal Space of Retzius. Please see separately dictated CT chest, abdomen, and pelvis report for detailed findings of the multifocal pelvic fractures. 3. Subcutaneous hematoma overlying the right gluteal region. 4.  Aortic Atherosclerosis (ICD10-I70.0). Electronically Signed   By: Agustin Cree M.D.   On: 04/24/2023 15:00   CT Elbow Right Wo Contrast  Result Date: 04/24/2023 CLINICAL DATA:  Status post fall, elbow trauma, pain EXAM: CT OF THE UPPER RIGHT EXTREMITY WITHOUT CONTRAST TECHNIQUE: Multidetector CT imaging of the upper right extremity was performed according to the standard protocol. RADIATION DOSE REDUCTION: This exam was performed according to the departmental dose-optimization program which includes automated exposure control, adjustment of the mA and/or kV according to patient size and/or use of iterative reconstruction technique. COMPARISON:  None Available. FINDINGS: Bones/Joint/Cartilage Comminuted fracture of the peripheral aspect of the olecranon with soft tissue gas tracking from the fracture site to the skin surface consistent with an open fracture. No other acute fracture or dislocation. Small amount of air within the joint space. Mild osteoarthritis of the ulnohumeral joint. Normal alignment. No significant joint effusion. Ligaments  Ligaments are suboptimally evaluated by CT. Muscles and Tendons Muscles are normal. No muscle atrophy. No intramuscular fluid collection or hematoma. Soft tissue No fluid collection or hematoma.  No soft tissue mass. IMPRESSION: 1. Comminuted fracture of the peripheral aspect of the olecranon with soft tissue gas tracking from the fracture site to the skin surface consistent with an open fracture. Electronically Signed   By: Elige Ko M.D.   On: 04/24/2023 14:00   DG Pelvis Portable  Result Date: 04/24/2023 CLINICAL DATA:  Status post binder placement. EXAM: PORTABLE PELVIS 1-2 VIEWS COMPARISON:  Same day. FINDINGS: There is continued presence of displaced right superior and inferior pubic rami fractures. Mild diastasis of pubic symphysis is noted which is slightly decreased compared to prior exam. Hip joints are unremarkable. IMPRESSION: Displaced right inferior and superior pubic rami fractures. Mild diastasis of pubic symphysis. Electronically Signed   By: Lupita Raider M.D.   On: 04/24/2023 13:19   DG Pelvis Portable  Result Date: 04/24/2023 CLINICAL DATA:  Fall from tree. EXAM: PORTABLE PELVIS 1-2 VIEWS COMPARISON:  None Available.  FINDINGS: Severely displaced fractures are seen involving the right inferior and superior pubic rami. Diastasis of pubic symphysis is noted as well. Hip joints are unremarkable. IMPRESSION: Severely displaced fractures involving the right inferior and superior pubic rami. Diastasis of pubic symphysis is noted as well. CT scan of pelvis is recommended for further evaluation. Electronically Signed   By: Lupita Raider M.D.   On: 04/24/2023 13:17   DG Chest Port 1 View  Result Date: 04/24/2023 CLINICAL DATA:  Fall from tree. EXAM: PORTABLE CHEST 1 VIEW COMPARISON:  February 09, 2019. FINDINGS: The heart size and mediastinal contours are within normal limits. Left-sided defibrillator is noted. Both lungs are clear. The visualized skeletal structures are unremarkable.  IMPRESSION: No active disease. Electronically Signed   By: Lupita Raider M.D.   On: 04/24/2023 13:15   CT CHEST ABDOMEN PELVIS W CONTRAST  Result Date: 04/24/2023 CLINICAL DATA:  Polytrauma, fall from tree EXAM: CT CHEST, ABDOMEN, AND PELVIS WITH CONTRAST TECHNIQUE: Multidetector CT imaging of the chest, abdomen and pelvis was performed following the standard protocol during bolus administration of intravenous contrast. RADIATION DOSE REDUCTION: This exam was performed according to the departmental dose-optimization program which includes automated exposure control, adjustment of the mA and/or kV according to patient size and/or use of iterative reconstruction technique. CONTRAST:  75 mL of Omnipaque 350 COMPARISON:  Chest CT dated June 25, 2011 FINDINGS: CT CHEST FINDINGS Cardiovascular: Normal heart size. No pericardial effusion. Normal caliber thoracic aorta with moderate atherosclerotic disease. Evidence of acute aortic injury, although evaluation of the aortic root and ascending thoracic aorta is limited due to motion artifact. Mediastinum/Nodes: Small hiatal hernia. Thyroid is unremarkable. No enlarged lymph nodes seen in the chest. Lungs/Pleura: Central airways are patent. Mild paraseptal and centrilobular emphysema. Bilateral lower lobe atelectasis. Bilateral solid pulmonary nodules. Largest is a 6 mm nodule of the left lower lobe located on series 5, image 81. Additional reference solid nodule of the right upper lobe measuring 5 mm on image 59. No pleural effusion or pneumothorax. Musculoskeletal: Mildly displaced and nondisplaced fractures of the right 6th-10th ribs and posterior right 10-12th ribs. Right T10 transverse process fracture. Age-indeterminate mild compression fractures of T4, T7 and T12. CT ABDOMEN PELVIS FINDINGS Hepatobiliary: No hepatic injury or perihepatic hematoma. Gallbladder is unremarkable. Pancreas: Unremarkable. No pancreatic ductal dilatation or surrounding inflammatory  changes. Spleen: No splenic injury or perisplenic hematoma. Adrenals/Urinary Tract: No adrenal hemorrhage or renal injury identified. Bladder is unremarkable. Nonobstructing left renal stone. No hydronephrosis. Urinary bladder is decompressed and contains Foley catheter. Stranding is seen in the extraperitoneal spaces surrounding the bladder. Stomach/Bowel: Stomach is within normal limits. Appendix appears normal. No evidence of bowel wall thickening, distention, or inflammatory changes. Vascular/Lymphatic: Aortic atherosclerosis. No enlarged abdominal or pelvic lymph nodes. Reproductive: Prostate is unremarkable. Other: No intraperitoneal free fluid or free air. Soft tissue hematoma of the right pelvis and right buttocks. No definite evidence of active arterial extravasation, although evaluation is limited due to contrast timing. Musculoskeletal: Comminuted fractures of the right iliac bone and right and left sacrum. Displaced and comminuted fractures of the inferior and superior right pubic rami. Mild diastasis of the pubic symphysis measuring up to 10 mm. IMPRESSION: 1. Extensive comminuted and displaced fractures of the right iliac bone, right pubic rami, and right and left sacrum. Mild diastasis of the pubic symphysis measuring up to 10 mm. 2. Soft tissue hematoma of the right pelvis and right buttocks. 3. Clarita Leber is seen in the extraperitoneal spaces  surrounding the bladder which could be due to pelvic hematoma or extraperitoneal bladder rupture. Recommend CT cystogram for further evaluation. 4. Mildly displaced and nondisplaced fractures of the right 6th-10th ribs, posterior right 10-12th ribs, and right T10 transverse process fracture. 5. Age-indeterminate mild compression fractures of T4, T7 and T12. Correlate for point tenderness. 6. Bilateral solid pulmonary nodules, largest measures 6 mm, new when compared with 2012 exam. Non-contrast chest CT at 3-6 months is recommended. If the nodules are stable at  time of repeat CT, then future CT at 18-24 months (from today's scan) is considered optional for low-risk patients, but is recommended for high-risk patients. This recommendation follows the consensus statement: Guidelines for Management of Incidental Pulmonary Nodules Detected on CT Images: From the Fleischner Society 2017; Radiology 2017; 284:228-243. Critical Value/emergent results were called by telephone at the time of interpretation on 04/24/2023 at 12:36 pm to provider Kris Mouton , who verbally acknowledged these results. Electronically Signed   By: Allegra Lai M.D.   On: 04/24/2023 13:09   CT HEAD WO CONTRAST  Result Date: 04/24/2023 CLINICAL DATA:  Head trauma, moderate-severe; Polytrauma, blunt. EXAM: CT HEAD WITHOUT CONTRAST CT CERVICAL SPINE WITHOUT CONTRAST TECHNIQUE: Multidetector CT imaging of the head and cervical spine was performed following the standard protocol without intravenous contrast. Multiplanar CT image reconstructions of the cervical spine were also generated. RADIATION DOSE REDUCTION: This exam was performed according to the departmental dose-optimization program which includes automated exposure control, adjustment of the mA and/or kV according to patient size and/or use of iterative reconstruction technique. COMPARISON:  None Available. FINDINGS: CT HEAD FINDINGS Brain: No acute hemorrhage. Cortical gray-white differentiation is preserved. Patchy hypoattenuation of the cerebral white matter, most consistent with mild chronic small-vessel disease. 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 Prominence of the ventricles and sulci within normal limits for age. No extra-axial collection. Basilar cisterns are patent. Vascular: No hyperdense vessel or unexpected calcification. Skull: No calvarial fracture or suspicious bone lesion. Skull base is unremarkable. Sinuses/Orbits: No acute finding. Other: None. These results were called by telephone at the time of  interpretation on 04/24/2023 at 12:35 pm to provider Vanetta Mulders , who verbally acknowledged these results. CT CERVICAL SPINE FINDINGS Alignment: 3 mm degenerative anterolisthesis of C4 on C5. Skull base and vertebrae: Within limitations of mild motion artifact, no acute fracture. Normal craniocervical junction. No suspicious bone lesions. Soft tissues and spinal canal: No prevertebral fluid or swelling. No visible canal hematoma. Disc levels: Multilevel cervical spondylosis, worst at C5-6 and C6-7, where there is at least mild spinal canal stenosis. Upper chest: No acute findings. 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 IMPRESSION: 1. No acute intracranial abnormality. 2. No acute cervical spine fracture or traumatic listhesis. 3. Severe atherosclerotic calcifications of the left carotid bulb and proximal left cervical ICA. Consider nonemergent carotid ultrasound for further evaluation. Electronically Signed   By: Orvan Falconer M.D.   On: 04/24/2023 12:46   CT CERVICAL SPINE WO CONTRAST  Result Date: 04/24/2023 CLINICAL DATA:  Head trauma, moderate-severe; Polytrauma, blunt. EXAM: CT HEAD WITHOUT CONTRAST CT CERVICAL SPINE WITHOUT CONTRAST TECHNIQUE: Multidetector CT imaging of the head and cervical spine was performed following the standard protocol without intravenous contrast. Multiplanar CT image reconstructions of the cervical spine were also generated. RADIATION DOSE REDUCTION: This exam was performed according to the departmental dose-optimization program which includes automated exposure control, adjustment of the mA and/or kV according to patient size  and/or use of iterative reconstruction technique. COMPARISON:  None Available. FINDINGS: CT HEAD FINDINGS Brain: No acute hemorrhage. Cortical gray-white differentiation is preserved. Patchy hypoattenuation of the cerebral white matter, most consistent with mild chronic small-vessel  disease. 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 Prominence of the ventricles and sulci within normal limits for age. No extra-axial collection. Basilar cisterns are patent. Vascular: No hyperdense vessel or unexpected calcification. Skull: No calvarial fracture or suspicious bone lesion. Skull base is unremarkable. Sinuses/Orbits: No acute finding. Other: None. These results were called by telephone at the time of interpretation on 04/24/2023 at 12:35 pm to provider Vanetta Mulders , who verbally acknowledged these results. CT CERVICAL SPINE FINDINGS Alignment: 3 mm degenerative anterolisthesis of C4 on C5. Skull base and vertebrae: Within limitations of mild motion artifact, no acute fracture. Normal craniocervical junction. No suspicious bone lesions. Soft tissues and spinal canal: No prevertebral fluid or swelling. No visible canal hematoma. Disc levels: Multilevel cervical spondylosis, worst at C5-6 and C6-7, where there is at least mild spinal canal stenosis. Upper chest: No acute findings. 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 IMPRESSION: 1. No acute intracranial abnormality. 2. No acute cervical spine fracture or traumatic listhesis. 3. Severe atherosclerotic calcifications of the left carotid bulb and proximal left cervical ICA. Consider nonemergent carotid ultrasound for further evaluation. Electronically Signed   By: Orvan Falconer M.D.   On: 04/24/2023 12:46    Assessment/Plan: LC 3 pelvic ring injury Right open olecranon avulsion injury Right open elbow arthrotomy  2 Days Post-Op s/p Procedure(s): Percutaneous fixation of right posterior pelvic fracture Percutaneous fixation of left sacrum Percutaneous fixation of right superior pubic ramus fracture Open treatment of right olecranon avulsion fracture Irrigation and debridement of right open olecranon fracture Irrigation and debridement of right open  elbow arthrotomy  Weightbearing: WBAT RUE, WBAT BLE but bed to chair transfers for the first 4 to 6 weeks Insicional and dressing care: Reinforce dressings as needed VTE prophylaxis: lovenox once cleared by trauma to start, holding for now, blood transfusion yesterday Pain control: continue current regimen Follow - up plan: w/ Dr. Marinus Maw 04/26/2023, 7:45 AM

## 2023-04-26 NOTE — Progress Notes (Signed)
Patient's BP remains labile despite 500cc bolus given. Patient symptomatic with increasing lethargy. Verbal order from MD Trauma on call to begin vasopressor infusion.   Care ongoing.    Hughie Closs, RN

## 2023-04-26 NOTE — Progress Notes (Addendum)
1535 -- Noted patient in ventricular trigeminy on monitor and had previously been experiencing frequent PVC's. Patient had no symptoms and was resting. Notified Dr. Cliffton Asters with orders for stat EKG. Entered patient room to perform EKG but patient refused stating "I just had one done 2 weeks ago." Educated patient on current reason for EKG and patient continued to refuse stating that he knew what the exam entailed and did not want to have one done. Dr. Cliffton Asters notified of patient refusal.   1650 -- RN noted on camera patient struggling with gown and offered to help. Patient declined help, but RN noted he seemed agitated. Noted on camera that patient also appeared to be pulling at O2 cord and touching foley catheter. RN attempted to assess patient, but patient refused to answer questions and repeatedly told RN to "get out of my room". Patient BP also dropping, with readings progressively lower. Patient becoming combative when touched by staff. Paged Dr. Cliffton Asters with new verbal orders placed. Patient placed in restraints for patient and staff safety. Care ongoing.

## 2023-04-26 NOTE — Progress Notes (Signed)
Patient's hemoglobin from 9.5 to 7 and patient symptomatic with labile blood pressures and increasing lethargy. This RN notified Trauma MD on call [place] of CBC results ----- verbal orders to transfuse 1 unit of PRBCs.    Care ongoing.   Hughie Closs, RN

## 2023-04-26 NOTE — Progress Notes (Signed)
Inpatient Rehab Admissions:  Inpatient Rehab Consult received.  I met with patient at the bedside for rehabilitation assessment and to discuss goals and expectations of an inpatient rehab admission.  Discussed average length of stay, insurance authorization requirement, discharge home after completion of CIR. Pt acknowledged understanding. Pt would like some time to think about pursuing CIR. Will follow up on Monday.  Signed: Wolfgang Phoenix, MS, CCC-SLP Admissions Coordinator [phone]

## 2023-04-26 NOTE — Progress Notes (Signed)
Inpatient Rehab Admissions Coordinator Note:   Per therapy patient was screened for CIR candidacy by Abiageal Blowe Luvenia Starch, CCC-SLP. At this time, pt appears to be a potential candidate for CIR. I will place an order for rehab consult for full assessment, per our protocol.  Please contact me any with questions.Wolfgang Phoenix, MS, CCC-SLP Admissions Coordinator [phone] 04/26/23 1:16 PM

## 2023-04-26 NOTE — Progress Notes (Signed)
Trauma MD on call notified by this RN due to patients labile blood pressures. Verbal orders for lactated ringers bolus to be infused as well as STAT CBC to be drawn.    Care ongoing.   Hughie Closs, RN

## 2023-04-26 NOTE — Progress Notes (Signed)
Follow up - Trauma Critical Care  Patient Details:    Louis Juarez is an 82 y.o. male.  Lines/tubes : Urethral Catheter lovick 16 Fr. (Active)  Indication for Insertion or Continuance of Catheter Peri-operative use for selective surgical procedure - not to exceed 24 hours post-op 04/24/23 2000  Site Assessment Clean, Dry, Intact 04/24/23 2000  Catheter Maintenance Bag below level of bladder;Catheter secured;Insertion date on drainage bag;Drainage bag/tubing not touching floor;No dependent loops;Seal intact 04/24/23 2000  Collection Container Standard drainage bag 04/24/23 2000  Securement Method Adhesive securement device 04/24/23 2000  Urinary Catheter Interventions (if applicable) Unclamped 04/24/23 2000  Output (mL) 250 mL 04/25/23 0500    Microbiology/Sepsis markers: Results for orders placed or performed during the hospital encounter of 04/24/23  MRSA Next Gen by PCR, Nasal     Status: None   Collection Time: 04/24/23  7:36 PM   Specimen: Nasal Mucosa; Nasal Swab  Result Value Ref Range Status   MRSA by PCR Next Gen NOT DETECTED NOT DETECTED Final    Comment: (NOTE) The GeneXpert MRSA Assay (FDA approved for NASAL specimens only), is one component of a comprehensive MRSA colonization surveillance program. It is not intended to diagnose MRSA infection nor to guide or monitor treatment for MRSA infections. Test performance is not FDA approved in patients less than 63 years old. Performed at [facility], [address], [place], [place] [address]     Anti-infectives:  Anti-infectives (From admission, onward)    Start     Dose/Rate Route Frequency Ordered Stop   04/24/23 2300  ceFAZolin (ANCEF) IVPB 2g/100 mL premix        2 g 200 mL/hr over 30 Minutes Intravenous Every 8 hours 04/24/23 1900 04/25/23 1557   04/24/23 1714  vancomycin (VANCOCIN) powder  Status:  Discontinued          As needed 04/24/23 1715 04/24/23 1749   04/24/23 1300  metroNIDAZOLE (FLAGYL)  IVPB 500 mg        500 mg 100 mL/hr over 60 Minutes Intravenous  Once 04/24/23 1257 04/24/23 1459   04/24/23 1215  ceFAZolin (ANCEF) IVPB 2g/100 mL premix        2 g 200 mL/hr over 30 Minutes Intravenous  Once 04/24/23 1202 04/24/23 1258       Consults: Treatment Team:  Md, Trauma, MD Haddix, Gillie Manners, MD    Studies:    Events:  Subjective:    Overnight Issues: 1U PRBC overnight for drifting hgb. Currently denies complaints   Objective:  Vital signs for last 24 hours: Temp:  [97.8 F (36.6 C)-98.4 F (36.9 C)] 98.4 F (36.9 C) (10/06 0800) Pulse Rate:  [61-80] 65 (10/06 0730) Resp:  [12-27] 15 (10/06 0730) BP: (89-152)/(38-82) 107/50 (10/06 0730) SpO2:  [90 %-100 %] 99 % (10/06 0730)  Hemodynamic parameters for last 24 hours:    Intake/Output from previous day: 10/05 0701 - 10/06 0700 In: 1881.4 [I.V.:42.1; Blood:590; IV Piggyback:1249.3] Out: 932 [Urine:932]  Intake/Output this shift: No intake/output data recorded.  Vent settings for last 24 hours:    Physical Exam:  General: alert and no respiratory distress Neuro: alert and oriented Resp: normal work of breathing CVS: rrr GI: abd soft, nontender, nondistended  Results for orders placed or performed during the hospital encounter of 04/24/23 (from the past 24 hour(s))  CBC     Status: Abnormal   Collection Time: 04/25/23  9:24 AM  Result Value Ref Range   WBC 12.9 (H)  4.0 - 10.5 K/uL   RBC 3.09 (L) 4.22 - 5.81 MIL/uL   Hemoglobin 9.5 (L) 13.0 - 17.0 g/dL   HCT 16.1 (L) 09.6 - 04.5 %   MCV 91.3 80.0 - 100.0 fL   MCH 30.7 26.0 - 34.0 pg   MCHC 33.7 30.0 - 36.0 g/dL   RDW 40.9 81.1 - 91.4 %   Platelets 104 (L) 150 - 400 K/uL   nRBC 0.0 0.0 - 0.2 %  Basic metabolic panel     Status: Abnormal   Collection Time: 04/25/23  9:24 AM  Result Value Ref Range   Sodium 136 135 - 145 mmol/L   Potassium 4.3 3.5 - 5.1 mmol/L   Chloride 101 98 - 111 mmol/L   CO2 24 22 - 32 mmol/L   Glucose, Bld 147 (H) 70  - 99 mg/dL   BUN 26 (H) 8 - 23 mg/dL   Creatinine, Ser 7.82 (H) 0.61 - 1.24 mg/dL   Calcium 8.0 (L) 8.9 - 10.3 mg/dL   GFR, Estimated 53 (L) >60 mL/min   Anion gap 11 5 - 15  CBC     Status: Abnormal   Collection Time: 04/26/23 12:38 AM  Result Value Ref Range   WBC 11.2 (H) 4.0 - 10.5 K/uL   RBC 2.32 (L) 4.22 - 5.81 MIL/uL   Hemoglobin 7.0 (L) 13.0 - 17.0 g/dL   HCT 95.6 (L) 21.3 - 08.6 %   MCV 93.5 80.0 - 100.0 fL   MCH 30.2 26.0 - 34.0 pg   MCHC 32.3 30.0 - 36.0 g/dL   RDW 57.8 46.9 - 62.9 %   Platelets 83 (L) 150 - 400 K/uL   nRBC 0.0 0.0 - 0.2 %  Prepare RBC (crossmatch)     Status: None   Collection Time: 04/26/23  1:24 AM  Result Value Ref Range   Order Confirmation      ORDER PROCESSED BY BLOOD BANK Performed at [facility], [address], [place], [place] [address]   Prepare RBC (crossmatch)     Status: None   Collection Time: 04/26/23  2:00 AM  Result Value Ref Range   Order Confirmation      ORDER PROCESSED BY BLOOD BANK Performed at [facility], [address] [place], [place] [address]     Assessment & Plan: Present on Admission:  Pelvic fracture (HCC)  Fall from ladder, 20 ft   Hemmorrhagic shock - resolved Pelvic fractures (R iliac bone, R pubic rami, and right and left sacrum with 10cm diastasis of pubic symphysis) -  Foley and binder placed in trauma bay. OR ortho 10/4 perc fixation of numerous pelvic fxs, ORIF right elbow.  Open R elbow/olecranon fx - Per Ortho --> I&D 10/4  R 6-12th rib fxs - multimodal pain control, pulm toilet, repeat CXR pending R T10 TP fxs - multimodal pain control.  Age-indeterminate mild compression fractures of T4, T7 and T12 - discussed with NSGY Dr. Jordan Likes who reviewed images. Does not need OR or brace/spine precautions and can assess further once pelvic fractures addressed Incidental findings - Pulmonary nodules and Severe atherosclerotic calcifications of the left carotid bulb and proximal left cervical ICA.  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 Monitor hgb ID: Tdap in ED, Ancef in ED.  Cont Ancef/flagyl VTE: SCDs, on hold given hemorrhagic shock on presentation Foley - Multiple I&O since foley removed yesterday; on flomax;  unable to empty - plan to replace today Dispo: ICU today  Dispo   LOS: 2 days   Additional comments:I reviewed the patient's new clinical lab test results. CBC, BMP  Critical Care Total Time*: 30 minutes  Marin Olp, MD [facility]  04/26/2023  *Care during the described time interval was provided by me. I have reviewed this patient's available data, including medical history, events of note, physical examination and test results as part of my evaluation.

## 2023-04-27 ENCOUNTER — Inpatient Hospital Stay (HOSPITAL_COMMUNITY): Payer: BC Managed Care – PPO

## 2023-04-27 LAB — TYPE AND SCREEN
ABO/RH(D): O POS
Antibody Screen: NEGATIVE
Unit division: 0
Unit division: 0
Unit division: 0
Unit division: 0
Unit division: 0
Unit division: 0
Unit division: 0
Unit division: 0
Unit division: 0
Unit division: 0
Unit division: 0
Unit division: 0
Unit division: 0
Unit division: 0
Unit division: 0
Unit division: 0

## 2023-04-27 LAB — BPAM RBC
Blood Product Expiration Date: 202410212359
Blood Product Expiration Date: 202410212359
Blood Product Expiration Date: 202410212359
Blood Product Expiration Date: 202410282359
Blood Product Expiration Date: 202410292359
Blood Product Expiration Date: 202410292359
Blood Product Expiration Date: 202410312359
Blood Product Expiration Date: 202410312359
Blood Product Expiration Date: 202410312359
Blood Product Expiration Date: 202410312359
Blood Product Expiration Date: 202410312359
Blood Product Expiration Date: 202411092359
Blood Product Expiration Date: 202411092359
Blood Product Expiration Date: 202411092359
Blood Product Expiration Date: 202411092359
Blood Product Expiration Date: 202411092359
ISSUE DATE / TIME: 202410041141
ISSUE DATE / TIME: 202410041148
ISSUE DATE / TIME: 202410041158
ISSUE DATE / TIME: 202410041238
ISSUE DATE / TIME: 202410051211
ISSUE DATE / TIME: 202410051422
ISSUE DATE / TIME: 202410051729
ISSUE DATE / TIME: 202410052215
ISSUE DATE / TIME: 202410052215
ISSUE DATE / TIME: 202410052215
ISSUE DATE / TIME: 202410052215
ISSUE DATE / TIME: 202410052215
ISSUE DATE / TIME: 202410052215
ISSUE DATE / TIME: 202410060143
ISSUE DATE / TIME: 202410062228
ISSUE DATE / TIME: 202410062228
Unit Type and Rh: 5100
Unit Type and Rh: 5100
Unit Type and Rh: 5100
Unit Type and Rh: 5100
Unit Type and Rh: 5100
Unit Type and Rh: 5100
Unit Type and Rh: 5100
Unit Type and Rh: 5100
Unit Type and Rh: 5100
Unit Type and Rh: 5100
Unit Type and Rh: 5100
Unit Type and Rh: 5100
Unit Type and Rh: 5100
Unit Type and Rh: 5100
Unit Type and Rh: 5100
Unit Type and Rh: 5100

## 2023-04-27 LAB — URINALYSIS, ROUTINE W REFLEX MICROSCOPIC
Bilirubin Urine: NEGATIVE
Glucose, UA: NEGATIVE mg/dL
Ketones, ur: 5 mg/dL — AB
Nitrite: NEGATIVE
Protein, ur: 30 mg/dL — AB
Specific Gravity, Urine: 1.016 (ref 1.005–1.030)
pH: 5 (ref 5.0–8.0)

## 2023-04-27 LAB — POCT I-STAT 7, (LYTES, BLD GAS, ICA,H+H)
Acid-Base Excess: 0 mmol/L (ref 0.0–2.0)
Bicarbonate: 26.2 mmol/L (ref 20.0–28.0)
Calcium, Ion: 1.24 mmol/L (ref 1.15–1.40)
HCT: 27 % — ABNORMAL LOW (ref 39.0–52.0)
Hemoglobin: 9.2 g/dL — ABNORMAL LOW (ref 13.0–17.0)
O2 Saturation: 95 %
Patient temperature: 99.1
Potassium: 4.4 mmol/L (ref 3.5–5.1)
Sodium: 136 mmol/L (ref 135–145)
TCO2: 28 mmol/L (ref 22–32)
pCO2 arterial: 49 mmHg — ABNORMAL HIGH (ref 32–48)
pH, Arterial: 7.337 — ABNORMAL LOW (ref 7.35–7.45)
pO2, Arterial: 85 mmHg (ref 83–108)

## 2023-04-27 LAB — CBC WITH DIFFERENTIAL/PLATELET
Abs Immature Granulocytes: 0.05 10*3/uL (ref 0.00–0.07)
Basophils Absolute: 0.1 10*3/uL (ref 0.0–0.1)
Basophils Relative: 1 %
Eosinophils Absolute: 0.6 10*3/uL — ABNORMAL HIGH (ref 0.0–0.5)
Eosinophils Relative: 7 %
HCT: 23.3 % — ABNORMAL LOW (ref 39.0–52.0)
Hemoglobin: 7.7 g/dL — ABNORMAL LOW (ref 13.0–17.0)
Immature Granulocytes: 1 %
Lymphocytes Relative: 16 %
Lymphs Abs: 1.4 10*3/uL (ref 0.7–4.0)
MCH: 30.4 pg (ref 26.0–34.0)
MCHC: 33 g/dL (ref 30.0–36.0)
MCV: 92.1 fL (ref 80.0–100.0)
Monocytes Absolute: 0.8 10*3/uL (ref 0.1–1.0)
Monocytes Relative: 9 %
Neutro Abs: 6.2 10*3/uL (ref 1.7–7.7)
Neutrophils Relative %: 66 %
Platelets: 90 10*3/uL — ABNORMAL LOW (ref 150–400)
RBC: 2.53 MIL/uL — ABNORMAL LOW (ref 4.22–5.81)
RDW: 14.5 % (ref 11.5–15.5)
WBC: 9.2 10*3/uL (ref 4.0–10.5)
nRBC: 0 % (ref 0.0–0.2)

## 2023-04-27 LAB — BASIC METABOLIC PANEL
Anion gap: 10 (ref 5–15)
BUN: 42 mg/dL — ABNORMAL HIGH (ref 8–23)
CO2: 24 mmol/L (ref 22–32)
Calcium: 8.4 mg/dL — ABNORMAL LOW (ref 8.9–10.3)
Chloride: 99 mmol/L (ref 98–111)
Creatinine, Ser: 1.22 mg/dL (ref 0.61–1.24)
GFR, Estimated: 59 mL/min — ABNORMAL LOW (ref >60)
Glucose, Bld: 153 mg/dL — ABNORMAL HIGH (ref 70–99)
Potassium: 4.1 mmol/L (ref 3.5–5.1)
Sodium: 133 mmol/L — ABNORMAL LOW (ref 135–145)

## 2023-04-27 LAB — VITAMIN D 25 HYDROXY (VIT D DEFICIENCY, FRACTURES): Vit D, 25-Hydroxy: 29.5 ng/mL — ABNORMAL LOW (ref 30–100)

## 2023-04-27 LAB — MAGNESIUM: Magnesium: 2.1 mg/dL (ref 1.7–2.4)

## 2023-04-27 LAB — PHOSPHORUS: Phosphorus: 2.4 mg/dL — ABNORMAL LOW (ref 2.5–4.6)

## 2023-04-27 MED ORDER — BETHANECHOL CHLORIDE 10 MG PO TABS
10.0000 mg | ORAL_TABLET | Freq: Three times a day (TID) | ORAL | Status: DC
  Administered 2023-04-27 – 2023-04-28 (×3): 10 mg via ORAL
  Filled 2023-04-27 (×4): qty 1

## 2023-04-27 MED ORDER — SODIUM CHLORIDE 0.9 % IV SOLN: 250.0000 mL | INTRAVENOUS | Status: DC

## 2023-04-27 MED ORDER — ENSURE MAX PROTEIN PO LIQD
11.0000 [oz_av] | Freq: Every day | ORAL | Status: DC
  Filled 2023-04-27: qty 330

## 2023-04-27 MED ORDER — PROSOURCE TF20 ENFIT COMPATIBL EN LIQD
60.0000 mL | Freq: Every day | ENTERAL | Status: DC
  Filled 2023-04-27: qty 60

## 2023-04-27 MED ORDER — ALBUMIN HUMAN 5 % IV SOLN
25.0000 g | Freq: Once | INTRAVENOUS | Status: AC
  Administered 2023-04-27: 25 g via INTRAVENOUS

## 2023-04-27 MED ORDER — NOREPINEPHRINE 4 MG/250ML-% IV SOLN
2.0000 ug/min | INTRAVENOUS | Status: DC
  Administered 2023-04-27: 9 ug/min via INTRAVENOUS
  Administered 2023-04-27: 2 ug/min via INTRAVENOUS
  Filled 2023-04-27: qty 250

## 2023-04-27 MED ORDER — ENSURE ENLIVE PO LIQD
237.0000 mL | Freq: Three times a day (TID) | ORAL | Status: DC
  Administered 2023-04-28 – 2023-05-13 (×31): 237 mL via ORAL

## 2023-04-27 MED ORDER — ALBUMIN HUMAN 5 % IV SOLN
INTRAVENOUS | Status: AC
  Filled 2023-04-27: qty 250

## 2023-04-27 MED ORDER — VITAL AF 1.2 CAL PO LIQD: 1000.0000 mL | ORAL | Status: DC

## 2023-04-27 MED ORDER — VITAL HIGH PROTEIN PO LIQD: 1000.0000 mL | ORAL | Status: DC

## 2023-04-27 MED ORDER — ENOXAPARIN SODIUM 30 MG/0.3ML IJ SOSY
30.0000 mg | PREFILLED_SYRINGE | Freq: Two times a day (BID) | INTRAMUSCULAR | Status: DC
  Administered 2023-04-27 – 2023-04-28 (×2): 30 mg via SUBCUTANEOUS
  Filled 2023-04-27 (×3): qty 0.3

## 2023-04-27 NOTE — Progress Notes (Signed)
Brief Nutrition Note  Consult received for enteral/tube feeding initiation and management.  Adult Enteral Nutrition Protocol initiated. Full assessment to follow.  10 Fr. Cortrak tube placed today in left nare. Still pending abdominal x-ray to confirm placement.  Once placement confirmed, initiate Vital AF 1.2 at 20 mL/hour  Admitting Dx: Pelvic fracture (HCC) [S32.9XXA] Fall, initial encounter [id number]] Closed displaced fracture of pelvis, unspecified part of pelvis, initial encounter (HCC) [S32.9XXA] Hypotension due to hypovolemia [E86.1]  Labs:  Recent Labs  Lab 04/24/23 1143 04/24/23 1340 04/25/23 0924 04/27/23 0449 04/27/23 1308  NA 137 138 136 133* 136  K 4.5 4.6 4.3 4.1 4.4  CL 101 100 101 99  --   CO2 24  --  24 24  --   BUN 18 23 26* 42*  --   CREATININE 1.42* 1.40* 1.34* 1.22  --   CALCIUM 7.9*  --  8.0* 8.4*  --   GLUCOSE 179* 175* 147* 153*  --     Diasia Henken Tollie Eth, MS, RD, LDN, CNSC Pager number available on Amion

## 2023-04-27 NOTE — Progress Notes (Addendum)
Follow up - Trauma Critical Care  Patient Details:    Louis Juarez is an 82 y.o. male.  Lines/tubes : Urethral Catheter lovick 16 Fr. (Active)  Indication for Insertion or Continuance of Catheter Peri-operative use for selective surgical procedure - not to exceed 24 hours post-op 04/24/23 2000  Site Assessment Clean, Dry, Intact 04/24/23 2000  Catheter Maintenance Bag below level of bladder;Catheter secured;Insertion date on drainage bag;Drainage bag/tubing not touching floor;No dependent loops;Seal intact 04/24/23 2000  Collection Container Standard drainage bag 04/24/23 2000  Securement Method Adhesive securement device 04/24/23 2000  Urinary Catheter Interventions (if applicable) Unclamped 04/24/23 2000  Output (mL) 250 mL 04/25/23 0500    Microbiology/Sepsis markers: Results for orders placed or performed during the hospital encounter of 04/24/23  MRSA Next Gen by PCR, Nasal     Status: None   Collection Time: 04/24/23  7:36 PM   Specimen: Nasal Mucosa; Nasal Swab  Result Value Ref Range Status   MRSA by PCR Next Gen NOT DETECTED NOT DETECTED Final    Comment: (NOTE) The GeneXpert MRSA Assay (FDA approved for NASAL specimens only), is one component of a comprehensive MRSA colonization surveillance program. It is not intended to diagnose MRSA infection nor to guide or monitor treatment for MRSA infections. Test performance is not FDA approved in patients less than 94 years old. Performed at [facility], [address], [place], [place] [address]     Anti-infectives:  Anti-infectives (From admission, onward)    Start     Dose/Rate Route Frequency Ordered Stop   04/24/23 2300  ceFAZolin (ANCEF) IVPB 2g/100 mL premix        2 g 200 mL/hr over 30 Minutes Intravenous Every 8 hours 04/24/23 1900 04/25/23 1557   04/24/23 1714  vancomycin (VANCOCIN) powder  Status:  Discontinued          As needed 04/24/23 1715 04/24/23 1749   04/24/23 1300  metroNIDAZOLE (FLAGYL)  IVPB 500 mg        500 mg 100 mL/hr over 60 Minutes Intravenous  Once 04/24/23 1257 04/24/23 1459   04/24/23 1215  ceFAZolin (ANCEF) IVPB 2g/100 mL premix        2 g 200 mL/hr over 30 Minutes Intravenous  Once 04/24/23 1202 04/24/23 1258       Consults: Treatment Team:  Md, Trauma, MD Haddix, Gillie Manners, MD    Studies:    Events:  Subjective:    Overnight Issues: Hgb stable at 7.7. Intermittently confused. Easily redirected this morning.  Objective:  Vital signs for last 24 hours: Temp:  [99.1 F (37.3 C)-101.2 F (38.4 C)] 99.5 F (37.5 C) (10/07 0800) Pulse Rate:  [67-109] 68 (10/07 0600) Resp:  [15-32] 16 (10/07 0600) BP: (94-159)/(41-134) 129/53 (10/07 0600) SpO2:  [76 %-98 %] 94 % (10/07 0600)  Hemodynamic parameters for last 24 hours:    Intake/Output from previous day: 10/06 0701 - 10/07 0700 In: 500.9 [I.V.:0.9; IV Piggyback:500] Out: 1100 [Urine:1100]  Intake/Output this shift: No intake/output data recorded.  Vent settings for last 24 hours:    Physical Exam:  General: resting comfortably, NAD Neuro: alert and oriented to person, place and time Resp: normal work of breathing on nasal cannula CV: RRR Abdomen: soft, nondistended, nontender to palpation.  Extremities: warm and well-perfused GU: foley draining clear yellow urine   Results for orders placed or performed during the hospital encounter of 04/24/23 (from the past 24 hour(s))  Hemoglobin and hematocrit, blood  Status: Abnormal   Collection Time: 04/26/23  5:51 PM  Result Value Ref Range   Hemoglobin 7.8 (L) 13.0 - 17.0 g/dL   HCT 13.0 (L) 86.5 - 78.4 %  CBC with Differential/Platelet     Status: Abnormal   Collection Time: 04/27/23  4:49 AM  Result Value Ref Range   WBC 9.2 4.0 - 10.5 K/uL   RBC 2.53 (L) 4.22 - 5.81 MIL/uL   Hemoglobin 7.7 (L) 13.0 - 17.0 g/dL   HCT 69.6 (L) 29.5 - 28.4 %   MCV 92.1 80.0 - 100.0 fL   MCH 30.4 26.0 - 34.0 pg   MCHC 33.0 30.0 - 36.0 g/dL   RDW  13.2 44.0 - 10.2 %   Platelets 90 (L) 150 - 400 K/uL   nRBC 0.0 0.0 - 0.2 %   Neutrophils Relative % 66 %   Neutro Abs 6.2 1.7 - 7.7 K/uL   Lymphocytes Relative 16 %   Lymphs Abs 1.4 0.7 - 4.0 K/uL   Monocytes Relative 9 %   Monocytes Absolute 0.8 0.1 - 1.0 K/uL   Eosinophils Relative 7 %   Eosinophils Absolute 0.6 (H) 0.0 - 0.5 K/uL   Basophils Relative 1 %   Basophils Absolute 0.1 0.0 - 0.1 K/uL   Immature Granulocytes 1 %   Abs Immature Granulocytes 0.05 0.00 - 0.07 K/uL  Basic metabolic panel     Status: Abnormal   Collection Time: 04/27/23  4:49 AM  Result Value Ref Range   Sodium 133 (L) 135 - 145 mmol/L   Potassium 4.1 3.5 - 5.1 mmol/L   Chloride 99 98 - 111 mmol/L   CO2 24 22 - 32 mmol/L   Glucose, Bld 153 (H) 70 - 99 mg/dL   BUN 42 (H) 8 - 23 mg/dL   Creatinine, Ser 7.25 0.61 - 1.24 mg/dL   Calcium 8.4 (L) 8.9 - 10.3 mg/dL   GFR, Estimated 59 (L) >60 mL/min   Anion gap 10 5 - 15    Assessment & Plan: Present on Admission:  Pelvic fracture (HCC)  Fall from ladder, 20 ft   Hemmorrhagic shock - resolved Pelvic fractures (R iliac bone, R pubic rami, and right and left sacrum with 10cm diastasis of pubic symphysis) -  Foley and binder placed in trauma bay. OR ortho 10/4 perc fixation of numerous pelvic fxs, ORIF right elbow. BLE - bed to chair transfers per ortho. Open R elbow/olecranon fx - Per Ortho --> I&D 10/4  R 6-12th rib fxs - multimodal pain control, pulm toilet R T10 TP fxs - multimodal pain control.  Age-indeterminate mild compression fractures of T4, T7 and T12 - discussed with NSGY Dr. Jordan Likes who reviewed images. Does not need OR or brace/spine precautions. Incidental findings - Pulmonary nodules and Severe atherosclerotic calcifications of the left carotid bulb and proximal left cervical ICA. 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 FEN: Heart healthy  diet, SLIV. Discontinue Lasix in setting of hypotension. ID: Tdap in ED, Ancef in ED for open fracture. VTE: SCDs, start lovenox today as hgb is stable. Foley - foley replaced 10/6 for urinary retention. Home Flomax resumed. Keep foley today, tentatively plan for another TOV tomorrow. Dispo: ICU  Patient became more somnolent after rounds this morning with hypotension (SBP 80s). Responds to painful stimuli. Discontinue all antihypertensives and sedating meds. Giving albumin bolus 5%. Will restart  levophed if needed. Hgb stable this morning.  Sophronia Simas, MD [facility] Surgery General, Hepatobiliary and Pancreatic Surgery 04/27/23 10:48 AM

## 2023-04-27 NOTE — Procedures (Signed)
Cortrak  Person Inserting Tube:  [name], RD Tube Type:  Cortrak - 43 inches Tube Size:  10 Tube Location:  Left nare Secured by: Bridle Technique Used to Measure Tube Placement:  Marking at nare/corner of mouth Cortrak Secured At:  64 cm   Cortrak Tube Team Note:  Consult received to place a Cortrak feeding tube.   X-ray is required, abdominal x-ray has been ordered by the Cortrak team. Please confirm tube placement before using the Cortrak tube.   If the tube becomes dislodged please keep the tube and contact the Cortrak team at [email or web address] for replacement.  If after hours and replacement cannot be delayed, place a NG tube and confirm placement with an abdominal x-ray.    Louis Juarez RD, LDN For contact information, refer to AMiON.   

## 2023-04-27 NOTE — PMR Pre-admission (Signed)
PMR Admission Coordinator Pre-Admission Assessment  Patient: Louis Juarez is an 82 y.o., male MRN: [id number] DOB: 03-03-1941 Height: 6\' 2"  (188 cm) Weight: 84.3 kg  Insurance Information HMO:     PPO: yes     PCP:      IPA:      80/20:      OTHER:  PRIMARY: Louis Juarez       Policy#: [id number]      Subscriber: Louis Juarez CM Name: *** via portal      Phone#: [phone]     Fax#: [phone] [employer] or fax [phone] Pre-Cert#:   [id number]  approved 10/22 until 10/28  Employer: [employer] Benefits:  Phone #: [phone]     Name: 10/10 Eff. Date: 08/13/22     Deduct: $3200 ( met)       Out of Pocket Max: $4600 ( Met)      Life Max: none CIR: 90%      SNF: 90% Outpatient: 90%     Co-Pay: 10% Home Health: 90%      Co-Pay: 10% DME: 90%     Co-Pay: 10% Providers: in-network SECONDARY: Medicare A  only    Policy#: [id number]     Phone#: verified eligibility via [employer] on 04/27/23 active 04/20/2005  Financial Counselor:       Phone#:   The "Data Collection Information Summary" for patients in Inpatient Rehabilitation Facilities with attached "Privacy Act Statement-Health Care Records" was provided and verbally reviewed with: Patient and Family  Emergency Contact Information Contact Information     Name Relation Home Work Mobile   Louis Juarez Spouse   [phone]      Other Contacts   None on File    Current Medical History  Patient Admitting Diagnosis: multiple fractures s/p fall  History of Present Illness:  82 year old male with medical hx significant for: HF with ICD . Pt [facility] to [facility] on 04/24/23 after a fall off a ladder as he was cutting limbs.. Pt fell ~20 feet.  Pt c/o pain in right elbow and right hip also had large laceration to right elbow area. Pelvic x-ray showed open book pelvic fracture. Pelvic binder placed. Imaging also revealed an open right elbow/olecranon fx, right 6-12 th rib fxs, right T10 TP fxs,  age-indeterminate mild compression fxs of T 4, T7, T12. Incidental finding of pulmonary nodules and severe atherosclerotic calcifications of left carotid bulb and proximal left cervical ICA. Pt initially placed on 2 L O2 but increased to 100% nonrebreather. Orthopedics consulted. Pt underwent percutaneous fixation of pelvis and I&D of right olecranon fx on 04/24/23 by Dr. Jena Gauss. Hbg 7 on 10/6; received 1 unit PRBC.   10/18 to OR for loss of fixation to pelvis. Underwent ORIF pubic symphysis and right superior pubic ramus. ORIF right posterior pelvis/SI joint. ALos I and D of right elbow infections.   Patient's medical record from [facility] has been reviewed by the rehabilitation admission coordinator and physician.  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 Yes  Family History   family history is not on file.  Current Medications  Current Facility-Administered Medications:    0.9 %  sodium chloride infusion (Manually program via Guardrails IV Fluids), , Intravenous, Once, Haddix, Gillie Manners, MD   acetaminophen (TYLENOL) tablet 1,000 mg, 1,000 mg, Oral, Q6H, Haddix, Gillie Manners, MD, 1,000 mg at 05/12/23 1240   albuterol (PROVENTIL) (2.5 MG/3ML) 0.083% nebulizer solution 3 mL, 3 mL, Inhalation, Q6H PRN, Haddix, Gillie Manners, MD, 3 mL at 05/03/23 1231   carvedilol (COREG) tablet 25 mg, 25 mg, Oral, BID WC,  Lysle Rubens, MD, 25 mg at 05/12/23 1308   Chlorhexidine Gluconate Cloth 2 % PADS 6 each, 6 each, Topical, Daily, Haddix, Gillie Manners, MD, 6 each at 05/12/23 [phone]   cholecalciferol (VITAMIN D3) 25 MCG (1000 UNIT) tablet 2,000 Units, 2,000 Units, Oral, Daily, Haddix, Gillie Manners, MD, 2,000 Units at 05/12/23 0954   enoxaparin (LOVENOX) injection 30 mg, 30 mg, Subcutaneous, Q12H, Haddix, Gillie Manners, MD, 30 mg at 05/12/23 4696   ezetimibe (ZETIA) tablet 10 mg, 10 mg, Oral, Daily, Haddix, Gillie Manners, MD, 10 mg at 05/12/23 0953   feeding supplement (ENSURE ENLIVE / ENSURE PLUS) liquid 237 mL, 237 mL, Oral, TID BM, Haddix, Gillie Manners, MD, 237 mL at 05/12/23 1422   furosemide (LASIX) tablet 20 mg, 20 mg, Oral, BID, Lysle Rubens, MD, 20 mg at 05/12/23 2952   guaiFENesin-dextromethorphan (ROBITUSSIN DM) 100-10 MG/5ML syrup 10 mL, 10 mL, Oral, Q6H PRN, Lysle Rubens, MD, 10 mL at 05/11/23 8413   hydrALAZINE (APRESOLINE) injection 10 mg, 10 mg, Intravenous, Q2H PRN, Haddix, Gillie Manners, MD   loratadine (CLARITIN) tablet 10 mg, 10 mg, Oral, QPM, Haddix, Gillie Manners, MD, 10 mg at 05/11/23 1658   LORazepam (ATIVAN) tablet 0.5 mg, 0.5 mg, Oral, Q2200, Haddix, Gillie Manners, MD, 0.5 mg at 05/11/23 2120   melatonin tablet 3 mg, 3 mg, Oral, QHS, Haddix, Gillie Manners, MD, 3 mg at 05/11/23 2119   methocarbamol (ROBAXIN) tablet 1,000 mg, 1,000 mg, Oral, Q8H, Haddix, Gillie Manners, MD, 1,000 mg at 05/12/23 1422   metoprolol tartrate (LOPRESSOR) injection 5 mg, 5 mg, Intravenous, Q6H PRN, Haddix, Gillie Manners, MD   multivitamin with minerals tablet 1 tablet, 1 tablet, Oral, Daily, Haddix, Gillie Manners, MD, 1 tablet at 05/12/23 0954   ondansetron (ZOFRAN-ODT) disintegrating tablet 4 mg, 4 mg, Oral, Q6H PRN **OR** ondansetron (ZOFRAN) injection 4 mg, 4 mg, Intravenous, Q6H PRN, Haddix, Gillie Manners, MD   Oral care mouth rinse, 15 mL, Mouth Rinse, PRN, Haddix, Gillie Manners, MD   oxyCODONE (Oxy IR/ROXICODONE) immediate release tablet 5 mg, 5 mg, Oral, Q4H PRN, Haddix, Gillie Manners,  MD, 5 mg at 05/12/23 1422   pantoprazole (PROTONIX) EC tablet 20 mg, 20 mg, Oral, Daily, Haddix, Gillie Manners, MD, 20 mg at 05/12/23 0954   polyethylene glycol (MIRALAX / GLYCOLAX) packet 17 g, 17 g, Oral, Daily PRN, Lysle Rubens, MD   potassium chloride SA (KLOR-CON M) CR tablet 40 mEq, 40 mEq, Oral, BID, Lysle Rubens, MD, 40 mEq at 05/12/23 0953   pravastatin (PRAVACHOL) tablet 80 mg, 80 mg, Oral, Daily, Haddix, Gillie Manners, MD, 80 mg at 05/12/23 0953   QUEtiapine (SEROQUEL) tablet 50 mg,  50 mg, Oral, QHS PRN, Haddix, Gillie Manners, MD, 50 mg at 05/11/23 2006   spironolactone (ALDACTONE) tablet 25 mg, 25 mg, Oral, Daily, Lysle Rubens, MD, 25 mg at 05/12/23 2841   tamsulosin (FLOMAX) capsule 0.8 mg, 0.8 mg, Oral, Daily, Haddix, Gillie Manners, MD, 0.8 mg at 05/12/23 3244  Patients Current Diet:  Diet Order             Diet regular Room service appropriate? Yes; Fluid consistency: Thin  Diet effective now                  Precautions / Restrictions Precautions Precautions: Fall Restrictions Weight Bearing Restrictions: Yes RUE Weight Bearing: Weight bearing as tolerated RLE Weight Bearing: Non weight bearing LLE Weight Bearing: Weight bearing as tolerated LLE Partial Weight Bearing Percentage or Pounds: . Other Position/Activity Restrictions: LLE WBAT for transfers only   Has the patient had 2 or more falls or a fall with injury in the past year? Yes  Prior Activity Level Limited Community (1-2x/wk): drives, gets ouf house ~1 day/week/mainly doctor's appointements  Prior Functional Level Self Care: Did the patient need help bathing, dressing, using the toilet or eating? Independent  Indoor Mobility: Did the patient need assistance with walking from room to room (with or without device)? Independent  Stairs: Did the patient need assistance with internal or external stairs (with or without device)? Independent  Functional Cognition: Did the patient need help planning regular tasks  such as shopping or remembering to take medications? Independent  Patient Information Are you of Hispanic, Latino/a,or Spanish origin?: A. No, not of Hispanic, Latino/a, or Spanish origin What is your race?: A. White Do you need or want an interpreter to communicate with a doctor or health care staff?: 0. No  Patient's Response To:  Health Literacy and Transportation Is the patient able to respond to health literacy and transportation needs?: Yes Health Literacy - How often do you need to have someone help you when you read instructions, pamphlets, or other written material from your doctor or pharmacy?: Never In the past 12 months, has lack of transportation kept you from medical appointments or from getting medications?: No In the past 12 months, has lack of transportation kept you from meetings, work, or from getting things needed for daily living?: No  Home Assistive Devices / Equipment Home Equipment: None  Prior Device Use: Indicate devices/aids used by the patient prior to current illness, exacerbation or injury? None of the above  Current Functional Level Cognition  Overall Cognitive Status: Impaired/Different from baseline Orientation Level: Oriented to person, Oriented to place, Oriented to situation Following Commands: Follows one step commands inconsistently, Follows one step commands with increased time Safety/Judgement: Decreased awareness of deficits General Comments: Repetitive questioning, cannot recall WB status for RLE. Wife present for session    Extremity Assessment (includes Sensation/Coordination)  Upper Extremity Assessment: Defer to OT evaluation RUE Deficits / Details: R olecranon fx with ORIF I&D completed on 10/18. pt lacks full extension minimally. Full ROM of wrist and digits . pt can do pad to pad, no change in sensation RUE: Unable to fully assess due to immobilization RUE Sensation: WNL RUE Coordination: WNL  Lower Extremity Assessment: Defer to PT  evaluation RLE Deficits / Details: unable to lift RLE against gravity RLE Sensation: decreased proprioception RLE Coordination: decreased gross motor    ADLs  Overall ADL's : Needs assistance/impaired Eating/Feeding: Set up, Sitting Grooming: Wash/dry hands, Wash/dry face, Set up, Sitting Grooming Details (indicate cue type  and reason): shaving face with electric razor then blade razor . Upper Body Bathing: Minimal assistance, Sitting Lower Body Bathing: Maximal assistance, Total assistance, Sit to/from stand, Sitting/lateral leans Upper Body Dressing : Minimal assistance, Sitting Lower Body Dressing: Total assistance, Maximal assistance, Sit to/from stand, Sitting/lateral leans Lower Body Dressing Details (indicate cue type and reason): to donn socks Toilet Transfer: Moderate assistance, +2 for safety/equipment, +2 for physical assistance Toilet Transfer Details (indicate cue type and reason): up in chair upon arrival Toileting- Clothing Manipulation and Hygiene: Total assistance, Sitting/lateral lean, Sit to/from stand Toileting - Clothing Manipulation Details (indicate cue type and reason): unable to complete peri-care in standing Functional mobility during ADLs: Moderate assistance, Cueing for sequencing, Cueing for safety, +2 for physical assistance, +2 for safety/equipment General ADL Comments: Patient wit need for re-eval after surgery on 10/18 for ORIF of pelvis and I&D of elbow. Continuesd difficulty recalling precautions. OT speaking with wife and patient to promote use of slide board or scooting back to bed as patient isnt fully cognizant of the weight he is putting through his leg with the Stedy. OT continues to recommend intensive rehab > 3hours. OT will follow.    Mobility  Overal bed mobility: Needs Assistance Bed Mobility: Supine to Sit Rolling: Mod assist, Used rails Sidelying to sit: Max assist, HOB elevated, Used rails Supine to sit: Max assist Sit to supine: Max  assist, HOB elevated General bed mobility comments: up in recliner upon arrival    Transfers  Overall transfer level: Needs assistance Equipment used: Ambulation equipment used Transfers: Bed to chair/wheelchair/BSC, Sit to/from Stand Sit to Stand: Max assist Bed to/from chair/wheelchair/BSC transfer type:: Via Lift equipment  Lateral/Scoot Transfers: Mod assist, +2 physical assistance, +2 safety/equipment, With slide board Transfer via Lift Equipment: Stedy General transfer comment: up in chair upon arrival    Ambulation / Gait / Stairs / Psychologist, prison and probation services  Ambulation/Gait General Gait Details: transfers only    Posture / Balance Dynamic Sitting Balance Sitting balance - Comments: L lean due to pain, min A to maintain sitting Balance Overall balance assessment: Needs assistance Sitting-balance support: Feet supported, Bilateral upper extremity supported Sitting balance-Leahy Scale: Poor Sitting balance - Comments: L lean due to pain, min A to maintain sitting Postural control: Left lateral lean Standing balance support: Bilateral upper extremity supported, Reliant on assistive device for balance Standing balance-Leahy Scale: Zero Standing balance comment: max A needed to maintain upright and keep wt off RLE    Special needs/care consideration    Previous Home Environment  Living Arrangements: Spouse/significant other  Lives With: Spouse Available Help at Discharge: Family, Available 24 hours/day Type of Home: House Home Layout: Able to live on main level with bedroom/bathroom Alternate Level Stairs-Number of Steps: does not go upstairs` Home Access: Stairs to enter Entrance Stairs-Rails: Can reach both Entrance Stairs-Number of Steps: 8 Bathroom Shower/Tub: Health visitor: Standard (also has toilet seat) Bathroom Accessibility: Yes How Accessible: Accessible via walker Home Care Services: No  Discharge Living Setting Plans for Discharge Living  Setting: Patient's home Type of Home at Discharge: House Discharge Home Layout: Able to live on main level with bedroom/bathroom Discharge Home Access: Stairs to enter Entrance Stairs-Rails: Can reach both Entrance Stairs-Number of Steps: 8 Discharge Bathroom Shower/Tub: Walk-in shower Discharge Bathroom Toilet: Standard (also has toilet seat) Discharge Bathroom Accessibility: Yes How Accessible: Accessible via walker Does the patient have any problems obtaining your medications?: No  Social/Family/Support Systems Patient Roles: Spouse, Parent Contact Information: wife Anticipated Caregiver:  wife and son from [place] Anticipated Caregiver's Contact Information: [phone] Ability/Limitations of Caregiver: wife works , but can do from home Caregiver Availability: 24/7 Discharge Plan Discussed with Primary Caregiver: Yes Is Caregiver In Agreement with Plan?: Yes Does Caregiver/Family have Issues with Lodging/Transportation while Pt is in Rehab?: No  Goals Patient/Family Goal for Rehab: supervision to min with PT and OT, supervision with SLP Expected length of stay: ELOS 14 to 20 days Pt/Family Agrees to Admission and willing to participate: Yes Program Orientation Provided & Reviewed with Pt/Caregiver Including Roles  & Responsibilities: Yes  Decrease burden of Care through IP rehab admission: NA  Possible need for SNF placement upon discharge: Not anticipated  Patient Condition: I have reviewed medical records from [facility], spoken with CM, and patient and spouse. I met with patient at the bedside for inpatient rehabilitation assessment.  Patient will benefit from ongoing PT, OT, and SLP, can actively participate in 3 hours of therapy a day 5 days of the week, and can make measurable gains during the admission.  Patient will also benefit from the coordinated team approach during an Inpatient Acute Rehabilitation admission.  The patient will receive intensive therapy as  well as Rehabilitation physician, nursing, social worker, and care management interventions.  Due to bladder management, safety, skin/wound care, disease management, medication administration, pain management, and patient education the patient requires 24 hour a day rehabilitation nursing.  The patient is currently *** with mobility and basic ADLs.  Discharge setting and therapy post discharge at home with home health is anticipated.  Patient has agreed to participate in the Acute Inpatient Rehabilitation Program and will admit {Time; today/tomorrow:10263}.  Preadmission Screen Completed By:  Ottie Glazier RN MSN 05/12/2023 3:02 PM ______________________________________________________________________   Discussed status with Dr. Marland Kitchen on *** at *** and received approval for admission today.  Admission Coordinator: Ottie Glazier RN MSN, time Marland KitchenDorna Bloom ***   Assessment/Plan: Diagnosis: Does the need for close, 24 hr/day Medical supervision in concert with the patient's rehab needs make it unreasonable for this patient to be served in a less intensive setting? {yes_no_potentially:3041433} Co-Morbidities requiring supervision/potential complications: *** Due to {due OZ:3086578}, does the patient require 24 hr/day rehab nursing? {yes_no_potentially:3041433} Does the patient require coordinated care of a physician, rehab nurse, PT, OT, and SLP to address physical and functional deficits in the context of the above medical diagnosis(es)? {yes_no_potentially:3041433} Addressing deficits in the following areas: {deficits:3041436} Can the patient actively participate in an intensive therapy program of at least 3 hrs of therapy 5 days a week? {yes_no_potentially:3041433} The potential for patient to make measurable gains while on inpatient rehab is {potential:3041437} Anticipated functional outcomes upon discharge from inpatient rehab: {functional outcomes:304600100} PT, {functional outcomes:304600100} OT,  {functional outcomes:304600100} SLP Estimated rehab length of stay to reach the above functional goals is: *** Anticipated discharge destination: {anticipated dc setting:21604} 10. Overall Rehab/Functional Prognosis: {potential:3041437}   MD Signature: ***

## 2023-04-27 NOTE — Progress Notes (Signed)
Inpatient Rehab Admissions Coordinator:  Saw pt and wife at bedside. Pt was asleep so spoke with wife, Dois Davenport. Explained CIR goals and expectations. Discussed average length of stay, insurance authorization requirement, and discharge home after completion of CIR. She acknowledged understanding. She is supportive of pt pursuing CIR. She confirmed that she and son will be able to provide 24/7 support. Will continue to follow.   Wolfgang Phoenix, MS, CCC-SLP Admissions Coordinator [phone]

## 2023-04-27 NOTE — Progress Notes (Signed)
Patient became very lethargic this morning and hypotensive, only responsive to painful stimuli. ABG unremarkable with only a mildly elevated pCO2 of 49. Hgb stable. Given albumin bolus to support BP, and levophed was started. Patient is now much more alert, and oriented to person and place. Suspect hypoactive delirium. Head CT completed and does not show any obvious abnormalities, however the final read is pending. Will also obtain UA to evaluate for sources of infection given urinary retention and foley.

## 2023-04-27 NOTE — Progress Notes (Signed)
BP goals while on levophed: SBP > 90, MAP okay to be less than 65. Verbal order to change goal given at 1245 by S. Freida Busman, MD.   Also, LEVO running through UGIV with no IV watch. Attempting to locate IV WATCH currently.  04/27/2023 Oralia Manis, RN 3:03 PM

## 2023-04-27 NOTE — Progress Notes (Signed)
Orthopaedic Trauma Progress Note  SUBJECTIVE: Doing ok this morning. Notes pain is currently well controlled. Requiring IV and oral meds every 2-4 hours. Has had difficulty getting good sleep at night. Takes Ambien at baseline. Noted to have issues with urinary retention following foley removal, foley re-inserted. Has had minimal appetite, I have add protein shakes. No chest pain. No SOB. No nausea/vomiting. No other complaints.   OBJECTIVE:  Vitals:   04/27/23 0500 04/27/23 0600  BP: (!) 100/47 (!) 129/53  Pulse: 70 68  Resp: 15 16  Temp:    SpO2: 95% 94%    General: Resting in bed, NAD Respiratory: No increased work of breathing.  RUE: Dressing CDI. Non-tender throughout extremity. Motor and sensory function intact distally. Hand warm and well perfused Pelvis/BLE: Dressing R anterior and lateral hip are CDI. Non-tender over these areas. Ankle DF/PF intact bilaterally. +EHL/FHL. Endorses sensation to light touch all aspect of foot bilaterally. +DP pulses, equal  IMAGING: Stable post op imaging.   LABS:  Results for orders placed or performed during the hospital encounter of 04/24/23 (from the past 24 hour(s))  Hemoglobin and hematocrit, blood     Status: Abnormal   Collection Time: 04/26/23  9:28 AM  Result Value Ref Range   Hemoglobin 8.4 (L) 13.0 - 17.0 g/dL   HCT 16.1 (L) 09.6 - 04.5 %  Hemoglobin and hematocrit, blood     Status: Abnormal   Collection Time: 04/26/23  5:51 PM  Result Value Ref Range   Hemoglobin 7.8 (L) 13.0 - 17.0 g/dL   HCT 40.9 (L) 81.1 - 91.4 %  CBC with Differential/Platelet     Status: Abnormal   Collection Time: 04/27/23  4:49 AM  Result Value Ref Range   WBC 9.2 4.0 - 10.5 K/uL   RBC 2.53 (L) 4.22 - 5.81 MIL/uL   Hemoglobin 7.7 (L) 13.0 - 17.0 g/dL   HCT 78.2 (L) 95.6 - 21.3 %   MCV 92.1 80.0 - 100.0 fL   MCH 30.4 26.0 - 34.0 pg   MCHC 33.0 30.0 - 36.0 g/dL   RDW 08.6 57.8 - 46.9 %   Platelets 90 (L) 150 - 400 K/uL   nRBC 0.0 0.0 - 0.2 %    Neutrophils Relative % 66 %   Neutro Abs 6.2 1.7 - 7.7 K/uL   Lymphocytes Relative 16 %   Lymphs Abs 1.4 0.7 - 4.0 K/uL   Monocytes Relative 9 %   Monocytes Absolute 0.8 0.1 - 1.0 K/uL   Eosinophils Relative 7 %   Eosinophils Absolute 0.6 (H) 0.0 - 0.5 K/uL   Basophils Relative 1 %   Basophils Absolute 0.1 0.0 - 0.1 K/uL   Immature Granulocytes 1 %   Abs Immature Granulocytes 0.05 0.00 - 0.07 K/uL  Basic metabolic panel     Status: Abnormal   Collection Time: 04/27/23  4:49 AM  Result Value Ref Range   Sodium 133 (L) 135 - 145 mmol/L   Potassium 4.1 3.5 - 5.1 mmol/L   Chloride 99 98 - 111 mmol/L   CO2 24 22 - 32 mmol/L   Glucose, Bld 153 (H) 70 - 99 mg/dL   BUN 42 (H) 8 - 23 mg/dL   Creatinine, Ser 6.29 0.61 - 1.24 mg/dL   Calcium 8.4 (L) 8.9 - 10.3 mg/dL   GFR, Estimated 59 (L) >60 mL/min   Anion gap 10 5 - 15    ASSESSMENT: Louis Juarez is a 82 y.o. male, 3 Days Post-Op s/p  ORIF PELVIC FRACTURE WITH PERCUTANEOUS SCREWS IRRIGATION AND DEBRIDEMENT RIGHT ELBOW  CV/Blood loss: Acute blood loss anemia, Hgb 7.7 this morning.  Received 1 unit PRBCs 04/26/2023. Hemodynamically stable  PLAN: Weightbearing:  BLE - Bed to chair transfers x 6 to 8 weeks RUE - WBAT ROM: BLE -unrestricted hip/knee/ankle ROM RUE -unrestricted ROM Incisional and dressing care: Change dressings as needed right hip and right elbow Showering: Okay to begin showering, incisions may get wet Orthopedic device(s): None  Pain management:  1. Tylenol 650 mg q 6 hours scheduled 2. Robaxin 1000 mg q 8 hours PRN 3. Oxycodone 5-10 mg q 4 hours PRN 4. Dilaudid 0.5 mg q 2 hours PRN VTE prophylaxis:  Lovenox on hold , SCDs ID: Ancef 2gm post op per open fracture protocol Foley/Lines: Foley in place for urinary retention. KVO IVFs Impediments to Fracture Healing: Vit D level pending, will start supplementation as indicated Dispo: Continue care per trauma team.  PT/OT evaluations ongoing, possible CIR candidate.     D/C recommendations: - Oxycodone, Robaxin for pain control - Eliquis 2.5 mg BID x 30 days for DVT prophylaxis - Possible need for  Vit D supplementation  Follow - up plan: 2 weeks after discharge for wound check and repeat x-rays   Contact information:  Truitt Merle MD, Thyra Breed PA-C. After hours and holidays please check [email or web address] for group call information for [facility]   Thompson Caul, PA-C [phone] (office) [email or web address]

## 2023-04-27 NOTE — Progress Notes (Signed)
PT Cancellation Note  Patient Details Name: Louis Juarez MRN: [id number] DOB: 1940/09/04   Cancelled Treatment:    Reason Eval/Treat Not Completed: Medical issues which prohibited therapy; per RN pt too hypotensive for therapy today.  Will follow up another day.   Elray Mcgregor 04/27/2023, 11:52 AM Sheran Lawless, PT Acute Rehabilitation Services Office:[phone] 04/27/2023

## 2023-04-27 NOTE — Progress Notes (Signed)
An USGPIV (ultrasound guided PIV) has been placed for short-term vasopressor infusion. A correctly placed ivWatch must be used when administering Vasopressors. Should this treatment be needed beyond 24 hours, central line access should be obtained.  It will be the responsibility of the bedside nurse to follow best practice to prevent extravasations.

## 2023-04-27 NOTE — Progress Notes (Signed)
OT Cancellation Note  Patient Details Name: Louis Juarez MRN: [id number] DOB: 02/27/1941   Cancelled Treatment:    Reason Eval/Treat Not Completed: Medical issues which prohibited therapy Patient hypotensive per RN, and requesting OT hold on treatment this date. OT will continue to follow.  Pollyann Glen E. Kendarius Vigen, OTR/L Acute Rehabilitation Services [phone]   Cherlyn Cushing 04/27/2023, 12:58 PM

## 2023-04-28 LAB — CBC
HCT: 23.8 % — ABNORMAL LOW (ref 39.0–52.0)
Hemoglobin: 7.7 g/dL — ABNORMAL LOW (ref 13.0–17.0)
MCH: 30.3 pg (ref 26.0–34.0)
MCHC: 32.4 g/dL (ref 30.0–36.0)
MCV: 93.7 fL (ref 80.0–100.0)
Platelets: 137 10*3/uL — ABNORMAL LOW (ref 150–400)
RBC: 2.54 MIL/uL — ABNORMAL LOW (ref 4.22–5.81)
RDW: 14.6 % (ref 11.5–15.5)
WBC: 9.1 10*3/uL (ref 4.0–10.5)
nRBC: 0 % (ref 0.0–0.2)

## 2023-04-28 LAB — MAGNESIUM
Magnesium: 2.1 mg/dL (ref 1.7–2.4)
Magnesium: 2.2 mg/dL (ref 1.7–2.4)

## 2023-04-28 LAB — GLUCOSE, CAPILLARY
Glucose-Capillary: 140 mg/dL — ABNORMAL HIGH (ref 70–99)
Glucose-Capillary: 181 mg/dL — ABNORMAL HIGH (ref 70–99)
Glucose-Capillary: 204 mg/dL — ABNORMAL HIGH (ref 70–99)

## 2023-04-28 LAB — BASIC METABOLIC PANEL
Anion gap: 7 (ref 5–15)
BUN: 33 mg/dL — ABNORMAL HIGH (ref 8–23)
CO2: 28 mmol/L (ref 22–32)
Calcium: 8.4 mg/dL — ABNORMAL LOW (ref 8.9–10.3)
Chloride: 100 mmol/L (ref 98–111)
Creatinine, Ser: 0.91 mg/dL (ref 0.61–1.24)
GFR, Estimated: >60 mL/min (ref >60)
Glucose, Bld: 141 mg/dL — ABNORMAL HIGH (ref 70–99)
Potassium: 4.1 mmol/L (ref 3.5–5.1)
Sodium: 135 mmol/L (ref 135–145)

## 2023-04-28 LAB — PHOSPHORUS
Phosphorus: 2.6 mg/dL (ref 2.5–4.6)
Phosphorus: 1.8 mg/dL — ABNORMAL LOW (ref 2.5–4.6)

## 2023-04-28 MED ORDER — KETOROLAC TROMETHAMINE 15 MG/ML IJ SOLN
30.0000 mg | Freq: Four times a day (QID) | INTRAMUSCULAR | Status: DC
Start: 2023-04-28 — End: 2023-04-28

## 2023-04-28 MED ORDER — MORPHINE SULFATE (PF) 2 MG/ML IV SOLN
2.0000 mg | INTRAVENOUS | Status: DC | PRN
  Administered 2023-04-30 – 2023-05-09 (×6): 2 mg via INTRAVENOUS
  Filled 2023-04-28 (×6): qty 1

## 2023-04-28 MED ORDER — BISACODYL 10 MG RE SUPP: 10.0000 mg | Freq: Once | RECTAL | Status: DC

## 2023-04-28 MED ORDER — ADULT MULTIVITAMIN W/MINERALS CH
1.0000 | ORAL_TABLET | Freq: Every day | ORAL | Status: DC
  Administered 2023-04-28 – 2023-05-13 (×15): 1 via ORAL
  Filled 2023-04-28 (×15): qty 1

## 2023-04-28 MED ORDER — TAMSULOSIN HCL 0.4 MG PO CAPS
0.8000 mg | ORAL_CAPSULE | Freq: Every day | ORAL | Status: DC
  Administered 2023-04-29 – 2023-05-13 (×14): 0.8 mg via ORAL
  Filled 2023-04-28 (×14): qty 2

## 2023-04-28 MED ORDER — SENNA 8.6 MG PO TABS
2.0000 | ORAL_TABLET | Freq: Once | ORAL | Status: AC
  Administered 2023-04-28: 17.2 mg via ORAL
  Filled 2023-04-28: qty 2

## 2023-04-28 MED ORDER — OXYCODONE HCL 5 MG PO TABS
5.0000 mg | ORAL_TABLET | ORAL | Status: DC | PRN
  Administered 2023-04-28 – 2023-05-02 (×9): 5 mg via ORAL
  Filled 2023-04-28 (×9): qty 1

## 2023-04-28 MED ORDER — BISACODYL 5 MG PO TBEC
10.0000 mg | DELAYED_RELEASE_TABLET | Freq: Once | ORAL | Status: AC
  Administered 2023-04-28: 10 mg via ORAL
  Filled 2023-04-28: qty 2

## 2023-04-28 MED ORDER — TAMSULOSIN HCL 0.4 MG PO CAPS
0.4000 mg | ORAL_CAPSULE | Freq: Once | ORAL | Status: AC
  Administered 2023-04-28: 0.4 mg via ORAL
  Filled 2023-04-28: qty 1

## 2023-04-28 MED ORDER — POLYETHYLENE GLYCOL 3350 17 G PO PACK
17.0000 g | PACK | Freq: Two times a day (BID) | ORAL | Status: DC
  Administered 2023-04-28 – 2023-05-09 (×17): 17 g via ORAL
  Filled 2023-04-28 (×18): qty 1

## 2023-04-28 MED ORDER — MAGNESIUM HYDROXIDE 400 MG/5ML PO SUSP
30.0000 mL | Freq: Once | ORAL | Status: AC
  Administered 2023-04-28: 30 mL via ORAL
  Filled 2023-04-28: qty 30

## 2023-04-28 MED ORDER — ENOXAPARIN SODIUM 30 MG/0.3ML IJ SOSY
30.0000 mg | PREFILLED_SYRINGE | Freq: Two times a day (BID) | INTRAMUSCULAR | Status: DC
  Administered 2023-04-28 – 2023-05-13 (×29): 30 mg via SUBCUTANEOUS
  Filled 2023-04-28 (×29): qty 0.3

## 2023-04-28 MED ORDER — MAGNESIUM CITRATE PO SOLN
1.0000 | Freq: Once | ORAL | Status: AC
  Administered 2023-04-28: 1 via ORAL
  Filled 2023-04-28: qty 296

## 2023-04-28 MED ORDER — KETOROLAC TROMETHAMINE 15 MG/ML IJ SOLN
15.0000 mg | Freq: Four times a day (QID) | INTRAMUSCULAR | Status: AC
  Administered 2023-04-28 – 2023-05-03 (×20): 15 mg via INTRAVENOUS
  Filled 2023-04-28 (×20): qty 1

## 2023-04-28 NOTE — Anesthesia Postprocedure Evaluation (Signed)
Anesthesia Post Note  Patient: Louis Juarez  Procedure(s) Performed: ORIF PELVIC FRACTURE WITH PERCUTANEOUS SCREWS (Bilateral) IRRIGATION AND DEBRIDEMENT ELBOW (Right: Elbow)     Patient location during evaluation: PACU Anesthesia Type: General Level of consciousness: awake and alert Pain management: pain level controlled Vital Signs Assessment: post-procedure vital signs reviewed and stable Respiratory status: spontaneous breathing, nonlabored ventilation, respiratory function stable and patient connected to nasal cannula oxygen Cardiovascular status: blood pressure returned to baseline and stable Postop Assessment: no apparent nausea or vomiting Anesthetic complications: no   No notable events documented.  Last Vitals:  Vitals:   04/28/23 0700 04/28/23 0800  BP: (!) 116/52 122/64  Pulse: 78 75  Resp: (!) 24 20  Temp:  37.2 C  SpO2: 100% 100%    Last Pain:  Vitals:   04/28/23 0800  TempSrc: Axillary  PainSc: 0-No pain                 [place]

## 2023-04-28 NOTE — Progress Notes (Signed)
Initial Nutrition Assessment  DOCUMENTATION CODES:   Not applicable  INTERVENTION:   - d/c tube feeding orders as pt does not have enteral access  - Liberalize diet to Regular to provide additional food options at meals and promote PO intake  - Continue Ensure Enlive po TID, each supplement provides 350 kcal and 20 grams of protein  - MVI with minerals daily  NUTRITION DIAGNOSIS:   Increased nutrient needs related to post-op healing, other (trauma) as evidenced by estimated needs.  GOAL:   Patient will meet greater than or equal to 90% of their needs  MONITOR:   PO intake, Supplement acceptance, Labs, Weight trends  REASON FOR ASSESSMENT:   Consult Enteral/tube feeding initiation and management, Assessment of nutrition requirement/status  ASSESSMENT:   82 year old male who presented to the ED on 10/04 after falling from a tree approximately 20 feet. 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 Pt admitted with open book pelvic fracture (R iliac bone, R pubic rami, and right and left sacrum with 10 cm diastasis of pubic symphysis), open R elbow/olecranon fx, R 6-12 rib fxs, R T10 TP fxs, hemorrhagic shock.  10/04 - s/p percutaneous fixation R posterior pelvic fx, percutaneous fixation L sacrum, percutaneous fixation of R superior public ramus fx, open treatment of R olecranon avulsion fx, I&D of R open olecranon fx, I&D of R open elbow arthrotomy; diet advanced to Heart Healthy 10/07 - Cortrak placement (coiled in proximal stomach), trickle TF ordered to start but never started due to pt breaking his Cortrak 10/08 - Cortrak removed  Discussed pt with RN who reports pt removed purple connector end of Cortrak tube yesterday shortly after Cortrak had been placed. Tube feeds were never initiated.  Met with pt at bedside. Noted end of Cortrak tube was just open tubing with nothing attached and no way to cap it. Pt reports that he hasn't eaten breakfast yet but that his wife had  ordered him something. Pt reports consuming 50% of an Ensure supplement this morning. Noted pt is currently on a Heart Healthy diet; will liberalize this to Regular to provide additional options at mealtimes.  Pt reports a fair appetite at baseline and that he usually eats 2 meals daily. He reports that he has maintained his weight at around 170 lbs for a long time. Weight on admission is 200 lbs but appears to be stated/estimated rather than measured. No weight history in chart to review.  Discussed importance of increasing kcal and protein intake to promote healing and to maintain lean muscle mass. Pt expresses understanding.  Medications reviewed and include: colace, Ensure Enlive TID, protonix  Labs reviewed: BUN 33, hemoglobin 7.7, platelets 137  UOP: 1255 ml x 24 hours  NUTRITION - FOCUSED PHYSICAL EXAM:  Flowsheet Row Most Recent Value  Orbital Region No depletion  Upper Arm Region No depletion  Thoracic and Lumbar Region No depletion  Buccal Region No depletion  Temple Region No depletion  Clavicle Bone Region Mild depletion  Clavicle and Acromion Bone Region Mild depletion  Scapular Bone Region Mild depletion  Dorsal Hand No depletion  Patellar Region Mild depletion  Anterior Thigh Region Moderate depletion  Posterior Calf Region Moderate depletion  Edema (RD Assessment) None  Hair Reviewed  Eyes Reviewed  Mouth Reviewed  Skin Reviewed  Nails Reviewed    Diet Order:   Diet Order             Diet regular Room service appropriate? Yes with Assist; Fluid  consistency: Thin  Diet effective now                   EDUCATION NEEDS:   Education needs have been addressed  Skin:  Skin Assessment: Skin Integrity Issues: Incisions: abd, R elbow  Last BM:  no documented BM  Height:   Ht Readings from Last 1 Encounters:  04/24/23 6\' 2"  (1.88 m)    Weight:   Wt Readings from Last 1 Encounters:  04/24/23 90.7 kg    Ideal Body Weight:  86.4 kg  BMI:  Body  mass index is 25.68 kg/m.  Estimated Nutritional Needs:   Kcal:  2000-2200  Protein:  100-115 grams  Fluid:  >2.0 L    Mertie Clause, MS, RD, LDN Registered Dietitian II Please see AMiON for contact information.

## 2023-04-28 NOTE — Progress Notes (Signed)
Trauma/Critical Care Follow Up Note  Subjective:    Overnight Issues:   Objective:  Vital signs for last 24 hours: Temp:  [97.9 F (36.6 C)-100 F (37.8 C)] 98.9 F (37.2 C) (10/08 0800) Pulse Rate:  [60-91] 75 (10/08 0800) Resp:  [13-26] 20 (10/08 0800) BP: (73-126)/(28-64) 122/64 (10/08 0800) SpO2:  [73 %-100 %] 100 % (10/08 0800)  Hemodynamic parameters for last 24 hours:    Intake/Output from previous day: 10/07 0701 - 10/08 0700 In: 51 [I.V.:51] Out: 1255 [Urine:1255]  Intake/Output this shift: No intake/output data recorded.  Vent settings for last 24 hours:    Physical Exam:  Gen: comfortable, no distress Neuro: follows commands, alert, communicative HEENT: PERRL Neck: supple CV: RRR Pulm: unlabored breathing on Alicia, 6L Abd: soft, NT    GU: urine clear and yellow, +Foley Extr: wwp, no edema  Results for orders placed or performed during the hospital encounter of 04/24/23 (from the past 24 hour(s))  Blood transfusion report - scanned     Status: None ()   Collection Time: 04/27/23 11:47 AM   Narrative   Ordered by an unspecified provider.  Blood transfusion report - scanned     Status: None   Collection Time: 04/27/23 11:47 AM   Narrative   Ordered by an unspecified provider.  Blood transfusion report - scanned     Status: None ()   Collection Time: 04/27/23 11:48 AM   Narrative   Ordered by an unspecified provider.  Blood transfusion report - scanned     Status: None   Collection Time: 04/27/23 11:48 AM   Narrative   Ordered by an unspecified provider.  Blood transfusion report - scanned     Status: None ()   Collection Time: 04/27/23 11:55 AM   Narrative   Ordered by an unspecified provider.  Blood transfusion report - scanned     Status: None   Collection Time: 04/27/23 11:55 AM   Narrative   Ordered by an unspecified provider.  I-STAT 7, (LYTES, BLD GAS, ICA, H+H)     Status: Abnormal   Collection Time: 04/27/23  1:08 PM  Result Value  Ref Range   pH, Arterial 7.337 (L) 7.35 - 7.45   pCO2 arterial 49.0 (H) 32 - 48 mmHg   pO2, Arterial 85 83 - 108 mmHg   Bicarbonate 26.2 20.0 - 28.0 mmol/L   TCO2 28 22 - 32 mmol/L   O2 Saturation 95 %   Acid-Base Excess 0.0 0.0 - 2.0 mmol/L   Sodium 136 135 - 145 mmol/L   Potassium 4.4 3.5 - 5.1 mmol/L   Calcium, Ion 1.24 1.15 - 1.40 mmol/L   HCT 27.0 (L) 39.0 - 52.0 %   Hemoglobin 9.2 (L) 13.0 - 17.0 g/dL   Patient temperature 78.2 F    Collection site RADIAL, ALLEN'S TEST ACCEPTABLE    Drawn by RT    Sample type ARTERIAL   Urinalysis, Routine w reflex microscopic -Urine, Catheterized     Status: Abnormal   Collection Time: 04/27/23  4:11 PM  Result Value Ref Range   Color, Urine YELLOW YELLOW   APPearance HAZY (A) CLEAR   Specific Gravity, Urine 1.016 1.005 - 1.030   pH 5.0 5.0 - 8.0   Glucose, UA NEGATIVE NEGATIVE mg/dL   Hgb urine dipstick LARGE (A) NEGATIVE   Bilirubin Urine NEGATIVE NEGATIVE   Ketones, ur 5 (A) NEGATIVE mg/dL   Protein, ur 30 (A) NEGATIVE mg/dL   Nitrite NEGATIVE NEGATIVE  Leukocytes,Ua MODERATE (A) NEGATIVE   RBC / HPF 21-50 0 - 5 RBC/hpf   WBC, UA 21-50 0 - 5 WBC/hpf   Bacteria, UA RARE (A) NONE SEEN   Squamous Epithelial / HPF 0-5 0 - 5 /HPF   Mucus PRESENT   Magnesium     Status: None   Collection Time: 04/27/23  5:11 PM  Result Value Ref Range   Magnesium 2.1 1.7 - 2.4 mg/dL  Phosphorus     Status: Abnormal   Collection Time: 04/27/23  5:11 PM  Result Value Ref Range   Phosphorus 2.4 (L) 2.5 - 4.6 mg/dL  CBC     Status: Abnormal   Collection Time: 04/28/23  4:39 AM  Result Value Ref Range   WBC 9.1 4.0 - 10.5 K/uL   RBC 2.54 (L) 4.22 - 5.81 MIL/uL   Hemoglobin 7.7 (L) 13.0 - 17.0 g/dL   HCT 16.1 (L) 09.6 - 04.5 %   MCV 93.7 80.0 - 100.0 fL   MCH 30.3 26.0 - 34.0 pg   MCHC 32.4 30.0 - 36.0 g/dL   RDW 40.9 81.1 - 91.4 %   Platelets 137 (L) 150 - 400 K/uL   nRBC 0.0 0.0 - 0.2 %  Basic metabolic panel     Status: Abnormal    Collection Time: 04/28/23  4:39 AM  Result Value Ref Range   Sodium 135 135 - 145 mmol/L   Potassium 4.1 3.5 - 5.1 mmol/L   Chloride 100 98 - 111 mmol/L   CO2 28 22 - 32 mmol/L   Glucose, Bld 141 (H) 70 - 99 mg/dL   BUN 33 (H) 8 - 23 mg/dL   Creatinine, Ser 7.82 0.61 - 1.24 mg/dL   Calcium 8.4 (L) 8.9 - 10.3 mg/dL   GFR, Estimated >95 >62 mL/min   Anion gap 7 5 - 15  Magnesium     Status: None   Collection Time: 04/28/23  4:39 AM  Result Value Ref Range   Magnesium 2.1 1.7 - 2.4 mg/dL  Phosphorus     Status: None   Collection Time: 04/28/23  4:39 AM  Result Value Ref Range   Phosphorus 2.6 2.5 - 4.6 mg/dL    Assessment & Plan: The plan of care was discussed with the bedside nurse for the day, who is in agreement with this plan and no additional concerns were raised.   Present on Admission:  Pelvic fracture (HCC)    LOS: 4 days   Additional comments:I reviewed the patient's new clinical lab test results.   and I reviewed the patients new imaging test results.    Fall from ladder, 20 ft   Hemmorrhagic shock - resolved Pelvic fractures (R iliac bone, R pubic rami, and right and left sacrum with 10cm diastasis of pubic symphysis) -  Foley and binder placed in trauma bay. OR ortho 10/4 perc fixation of numerous pelvic fxs, ORIF right elbow. BLE - bed to chair transfers per ortho. Open R elbow/olecranon fx - Per Ortho --> I&D 10/4  R 6-12th rib fxs - multimodal pain control, pulm toilet R T10 TP fxs - multimodal pain control.  Age-indeterminate mild compression fractures of T4, T7 and T12 - discussed with NSGY Dr. Jordan Likes who reviewed images. Does not need OR or brace/spine precautions. Incidental findings - Pulmonary nodules and Severe atherosclerotic calcifications of the left carotid bulb and proximal left cervical ICA. 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 FEN: Heart  healthy diet, SLIV. Discontinue Lasix in setting of hypotension. ID: Tdap in ED, Ancef in ED for open fracture. VTE: SCDs, LMWH Foley - foley replaced 10/6 for urinary retention. Home Flomax resumed, incr dose today Dispo: txf to 4NP  Diamantina Monks, MD Trauma & General Surgery Please use [email or web address] to contact on call provider  04/28/2023  *Care during the described time interval was provided by me. I have reviewed this patient's available data, including medical history, events of note, physical examination and test results as part of my evaluation.

## 2023-04-28 NOTE — Progress Notes (Signed)
Inpatient Rehabilitation Admissions Coordinator   Met with patient at bedside. I will follow his progress over the next few days to assess rehab venue options pending his progress and CIR bed availability.  Ottie Glazier, RN, MSN Rehab Admissions Coordinator [phone] 04/28/2023 11:40 AM

## 2023-04-28 NOTE — Progress Notes (Signed)
Occupational Therapy Treatment Patient Details Name: Louis Juarez MRN: [id number] DOB: Oct 04, 1940 Today's Date: 04/28/2023   History of present illness 82 y.o. male presents to [facility] hospital on 04/24/2023 after fall from ladder. Pt found to have open book pelvic fx, open R olceranon fx, R rib 6-12 fx, R T10 TP fx, and age-indeterminate mild compression fxs of T4, T7 and T12. Pt underwent percutaneous fixation of pelvic fxs and open treatment of R olecranon fx on 10/4. PMH includes heart failure, ICD.   OT comments  Pt progressed from bed to chair this session with sit<>Stand. Pt noted to not advance R LE with transfer. Pt needed increased time on 6L No Name to rebound due to DOE. Patient will benefit from intensive inpatient follow up therapy, >3 hours/day       If plan is discharge home, recommend the following:  Two people to help with walking and/or transfers;A lot of help with bathing/dressing/bathroom;Assistance with cooking/housework;Assist for transportation;Help with stairs or ramp for entrance;Supervision due to cognitive status   Equipment Recommendations  Other (comment)    Recommendations for Other Services Rehab consult    Precautions / Restrictions Precautions Precautions: Fall Restrictions Weight Bearing Restrictions: Yes RUE Weight Bearing: Weight bearing as tolerated RLE Weight Bearing: Weight bearing as tolerated LLE Weight Bearing: Weight bearing as tolerated       Mobility Bed Mobility Overal bed mobility: Needs Assistance Bed Mobility: Rolling, Supine to Sit Rolling: +2 for physical assistance, Min assist, Used rails Sidelying to sit: +2 for physical assistance, Mod assist       General bed mobility comments: pt log and exiting on the R side of the with A to elevate trunk. pt at eob with lateral lean to the Right. pt reports pain with pressure on R side sitting    Transfers Overall transfer level: Needs assistance Equipment used: Rolling walker (2  wheels) Transfers: Sit to/from Stand, Bed to chair/wheelchair/BSC Sit to Stand: +2 physical assistance, Max assist           General transfer comment: pt transfered from bed to chair with inability to lift the R LE. pt is able to demonstrate movement in sitting and able to heel to toe to move foot lateral. pt total +2 max (A) transfer with RW. pt total (A) for walker movement     Balance Overall balance assessment: Needs assistance Sitting-balance support: Single extremity supported, Feet supported Sitting balance-Leahy Scale: Fair Sitting balance - Comments: anterior and L lateral lean as pt reports significant pain with weight shift onto R pelvis Postural control: Left lateral lean                                 ADL either performed or assessed with clinical judgement   ADL Overall ADL's : Needs assistance/impaired Eating/Feeding: Set up;Bed level                                     General ADL Comments: pt transfered from bed to chair this session    Extremity/Trunk Assessment Upper Extremity Assessment Upper Extremity Assessment: Right hand dominant   Lower Extremity Assessment Lower Extremity Assessment: Defer to PT evaluation        Vision       Perception     Praxis      Cognition Arousal: Alert Behavior During Therapy: Restless Overall  Cognitive Status: Within Functional Limits for tasks assessed                                 General Comments: pt able to determine hospital and reports being extremely hot. pt with restraints off at this time Suspect more executive testing needed for cognition        Exercises      Shoulder Instructions       General Comments 6L [place] with accessory breathing noted with poor wave form. Pt needed increased time to rebound. pt able to only say 2 words without DOE noted. pt noted to turn his head and shift with breaths .    Pertinent Vitals/ Pain       Pain Assessment Pain  Assessment: 0-10 Pain Score: 8  Pain Location: low back Pain Descriptors / Indicators: Grimacing Pain Intervention(s): Monitored during session, Premedicated before session, Repositioned, Limited activity within patient's tolerance  Home Living                                          Prior Functioning/Environment              Frequency  Min 1X/week        Progress Toward Goals  OT Goals(current goals can now be found in the care plan section)  Progress towards OT goals: Progressing toward goals  Acute Rehab OT Goals Patient Stated Goal: to get cooled off. pt reports increased comfort in the chair OT Goal Formulation: With patient Time For Goal Achievement: 05/09/23 Potential to Achieve Goals: Good ADL Goals Pt Will Perform Grooming: Independently;sitting Pt Will Perform Lower Body Bathing: with mod assist;sit to/from stand;sitting/lateral leans Pt Will Perform Lower Body Dressing: with mod assist;sit to/from stand;sitting/lateral leans Pt Will Transfer to Toilet: with mod assist;bedside commode;stand pivot transfer Pt Will Perform Toileting - Clothing Manipulation and hygiene: with mod assist;sitting/lateral leans;sit to/from stand Additional ADL Goal #2: Patient will be able to complete bed mobility at min A level as a precursor to OOB activities.  Plan      Co-evaluation    PT/OT/SLP Co-Evaluation/Treatment: Yes Reason for Co-Treatment: Complexity of the patient's impairments (multi-system involvement);Necessary to address cognition/behavior during functional activity;For patient/therapist safety   OT goals addressed during session: ADL's and self-care;Proper use of Adaptive equipment and DME;Strengthening/ROM      AM-PAC OT "6 Clicks" Daily Activity     Outcome Measure   Help from another person eating meals?: A Little Help from another person taking care of personal grooming?: A Little Help from another person toileting, which includes  using toliet, bedpan, or urinal?: A Lot Help from another person bathing (including washing, rinsing, drying)?: A Lot Help from another person to put on and taking off regular upper body clothing?: A Lot Help from another person to put on and taking off regular lower body clothing?: Total 6 Click Score: 13    End of Session Equipment Utilized During Treatment: Gait belt;Rolling walker (2 wheels);Oxygen  OT Visit Diagnosis: Unsteadiness on feet (R26.81);Muscle weakness (generalized) (M62.81) Pain - Right/Left: Right   Activity Tolerance Patient tolerated treatment well   Patient Left in chair;with call bell/phone within reach;with chair alarm set (recommendation for hoyer lift back to bed)   Nurse Communication Mobility status;Precautions;Need for lift equipment        Time:  1610-9604 OT Time Calculation (min): 30 min  Charges: OT General Charges $OT Visit: 1 Visit OT Treatments $Self Care/Home Management : 8-22 mins   Brynn, OTR/L  Acute Rehabilitation Services Office: [phone] .   Mateo Flow 04/28/2023, 5:32 PM

## 2023-04-28 NOTE — Progress Notes (Signed)
Physical Therapy Treatment Patient Details Name: Louis Juarez MRN: [id number] DOB: [birth year] Today's Date: 04/28/2023   History of Present Illness  82 y.o. male presents to [facility] hospital on 04/24/2023 after fall from ladder. Pt found to have open book pelvic fx, open R olceranon fx, R rib 6-12 fx, R T10 TP fx, and age-indeterminate mild compression fxs of T4, T7 and T12. Pt underwent percutaneous fixation of pelvic fxs and open treatment of R olecranon fx on 10/4. PMH includes heart failure, ICD.    PT Comments  Patient resting in bed and agreeable to mobilize with goal of OOB today with therapy. Pt perseverating on being hot at start of session, VSS and fan provided which improved pt comfort. PT required min-mod +2 assist for rolling and side>sit. Min assist needed at EOB to maintain seated balance due to heavy anterior and Lt lateral lean to un-weight Rt hip/back due to pain. Patient was able to stand with RW and Max+2 assist and total +2 assist to pivot turn bed>recliner. Pt with inability to lift and turn Rt LE and heavy reliance on RW limiting ability to pivot walker for turn as well. PT noted to have increased WOB throughout activity and poor pleuth reading in 80's. Replaced monitor to Rt finger and SpO2 100% on 6L/min. Pt WOB improved with extended seated rest but continued rocking towards Lt with inspiration reporting this reduce pain. Recommend maxi-sky lift for chair<>bed for RN staff and lift in room for transfer. Will continue to progress pt as able during acute stay.      04/28/23 1810  PT Visit Information  Last PT Received On 04/28/23  Assistance Needed +2  PT/OT/SLP Co-Evaluation/Treatment Yes  Reason for Co-Treatment Complexity of the patient's impairments (multi-system involvement);Necessary to address cognition/behavior during functional activity;For patient/therapist safety  PT goals addressed during session Mobility/safety with mobility;Balance;Strengthening/ROM  OT goals  addressed during session ADL's and self-care;Proper use of Adaptive equipment and DME;Strengthening/ROM  History of Present Illness 82 y.o. male presents to [facility] hospital on 04/24/2023 after fall from ladder. Pt found to have open book pelvic fx, open R olceranon fx, R rib 6-12 fx, R T10 TP fx, and age-indeterminate mild compression fxs of T4, T7 and T12. Pt underwent percutaneous fixation of pelvic fxs and open treatment of R olecranon fx on 10/4. PMH includes heart failure, ICD.  Subjective Data  Patient Stated Goal to reduce pain, return to independence  Precautions  Precautions Fall  Restrictions  Weight Bearing Restrictions Yes  RUE Weight Bearing WBAT  RLE Weight Bearing WBAT  LLE Weight Bearing WBAT  Pain Assessment  Pain Assessment 0-10  Pain Score 8  Pain Location low back  Pain Descriptors / Indicators Grimacing  Pain Intervention(s) Monitored during session;Premedicated before session;Repositioned;Limited activity within patient's tolerance  Cognition  Arousal Alert  Behavior During Therapy Restless  Overall Cognitive Status Within Functional Limits for tasks assessed  General Comments pt able to determine hospital and reports being extremely hot. pt with restraints off at this time Suspect more executive testing needed for cognition  Bed Mobility  Overal bed mobility Needs Assistance  Bed Mobility Rolling;Supine to Sit  Rolling +2 for physical assistance;Min assist;Used rails  Sidelying to sit +2 for physical assistance;Mod assist  General bed mobility comments pt log and exiting on the R side of the with A to elevate trunk. pt at eob with lateral lean to the Left. pt reports pain with pressure on R side sitting  Transfers  Overall transfer level  Needs assistance  Equipment used Rolling walker (2 wheels)  Transfers Sit to/from Stand;Bed to chair/wheelchair/BSC  Sit to Stand +2 physical assistance;Max assist  General transfer comment pt transfered from bed to chair with  inability to lift the R LE. pt is able to demonstrate movement in sitting and able to heel to toe to move foot lateral. pt total +2 max (A) transfer with RW. pt total (A) for walker movement  Balance  Overall balance assessment Needs assistance  Sitting-balance support Single extremity supported;Feet supported  Sitting balance-Leahy Scale Fair  Sitting balance - Comments anterior and L lateral lean as pt reports significant pain with weight shift onto R pelvis  Postural control Left lateral lean  PT - End of Session  Equipment Utilized During Treatment Gait belt;Oxygen  Activity Tolerance Patient limited by pain;Patient limited by fatigue  Patient left in chair;with call bell/phone within reach;with chair alarm set  Nurse Communication Mobility status;Need for lift equipment   PT - Assessment/Plan  PT Visit Diagnosis Other abnormalities of gait and mobility (R26.89);Muscle weakness (generalized) (M62.81);Pain  Pain - Right/Left Right  Pain - part of body Hip  PT Frequency (ACUTE ONLY) Min 1X/week  Recommendations for Other Services Rehab consult  Follow Up Recommendations Acute inpatient rehab (3hours/day)  Patient can return home with the following Two people to help with walking and/or transfers;Two people to help with bathing/dressing/bathroom;Assistance with cooking/housework;Assist for transportation;Help with stairs or ramp for entrance  PT equipment Wheelchair (measurements PT);Wheelchair cushion (measurements PT);Hospital bed;Hoyer lift  AM-PAC PT "6 Clicks" Mobility Outcome Measure (Version 2)  Help needed turning from your back to your side while in a flat bed without using bedrails? 3  Help needed moving from lying on your back to sitting on the side of a flat bed without using bedrails? 2  Help needed moving to and from a bed to a chair (including a wheelchair)? 1  Help needed standing up from a chair using your arms (e.g., wheelchair or bedside chair)? 1  Help needed to walk in  hospital room? 1  Help needed climbing 3-5 steps with a railing?  1  6 Click Score 9  Consider Recommendation of Discharge To: CIR/SNF/LTACH  Progressive Mobility  What is the highest level of mobility based on the progressive mobility assessment? Level 3 (Stands with assist) - Balance while standing  and cannot march in place  Mobility Referral No  PT Goal Progression  Progress towards PT goals Progressing toward goals  Acute Rehab PT Goals  PT Goal Formulation With patient  Time For Goal Achievement 05/09/23  Potential to Achieve Goals Fair  PT Time Calculation  PT Start Time (ACUTE ONLY) 1340  PT Stop Time (ACUTE ONLY) 1408  PT Time Calculation (min) (ACUTE ONLY) 28 min  PT General Charges  $$ ACUTE PT VISIT 1 Visit  PT Treatments  $Therapeutic Activity 8-22 mins     Wynn Maudlin, DPT Acute Rehabilitation Services Office [phone]  04/28/23 10:59 PM

## 2023-04-28 NOTE — Plan of Care (Signed)
  Problem: Education: Goal: Knowledge of General Education information will improve Description: Including pain rating scale, medication(s)/side effects and non-pharmacologic comfort measures Outcome: Progressing   Problem: Clinical Measurements: Goal: Ability to maintain clinical measurements within normal limits will improve Outcome: Progressing Goal: Will remain free from infection Outcome: Progressing Goal: Diagnostic test results will improve Outcome: Progressing Goal: Respiratory complications will improve Outcome: Progressing Goal: Cardiovascular complication will be avoided Outcome: Progressing   Problem: Activity: Goal: Risk for activity intolerance will decrease Outcome: Progressing   Problem: Nutrition: Goal: Adequate nutrition will be maintained Outcome: Progressing   Problem: Pain Managment: Goal: General experience of comfort will improve Outcome: Progressing   Problem: Skin Integrity: Goal: Risk for impaired skin integrity will decrease Outcome: Progressing

## 2023-04-29 LAB — BASIC METABOLIC PANEL
Anion gap: 9 (ref 5–15)
BUN: 35 mg/dL — ABNORMAL HIGH (ref 8–23)
CO2: 29 mmol/L (ref 22–32)
Calcium: 8.8 mg/dL — ABNORMAL LOW (ref 8.9–10.3)
Chloride: 99 mmol/L (ref 98–111)
Creatinine, Ser: 1.05 mg/dL (ref 0.61–1.24)
GFR, Estimated: >60 mL/min (ref >60)
Glucose, Bld: 150 mg/dL — ABNORMAL HIGH (ref 70–99)
Potassium: 5.5 mmol/L — ABNORMAL HIGH (ref 3.5–5.1)
Sodium: 137 mmol/L (ref 135–145)

## 2023-04-29 LAB — CBC
HCT: 25.3 % — ABNORMAL LOW (ref 39.0–52.0)
Hemoglobin: 8.3 g/dL — ABNORMAL LOW (ref 13.0–17.0)
MCH: 31.4 pg (ref 26.0–34.0)
MCHC: 32.8 g/dL (ref 30.0–36.0)
MCV: 95.8 fL (ref 80.0–100.0)
Platelets: 165 10*3/uL (ref 150–400)
RBC: 2.64 MIL/uL — ABNORMAL LOW (ref 4.22–5.81)
RDW: 14.9 % (ref 11.5–15.5)
WBC: 12.6 10*3/uL — ABNORMAL HIGH (ref 4.0–10.5)
nRBC: 0.2 % (ref 0.0–0.2)

## 2023-04-29 LAB — GLUCOSE, CAPILLARY
Glucose-Capillary: 138 mg/dL — ABNORMAL HIGH (ref 70–99)
Glucose-Capillary: 151 mg/dL — ABNORMAL HIGH (ref 70–99)
Glucose-Capillary: 159 mg/dL — ABNORMAL HIGH (ref 70–99)
Glucose-Capillary: 181 mg/dL — ABNORMAL HIGH (ref 70–99)
Glucose-Capillary: 198 mg/dL — ABNORMAL HIGH (ref 70–99)
Glucose-Capillary: 203 mg/dL — ABNORMAL HIGH (ref 70–99)

## 2023-04-29 LAB — MAGNESIUM: Magnesium: 2.4 mg/dL (ref 1.7–2.4)

## 2023-04-29 LAB — PHOSPHORUS: Phosphorus: 1.8 mg/dL — ABNORMAL LOW (ref 2.5–4.6)

## 2023-04-29 MED ORDER — SODIUM PHOSPHATES 45 MMOLE/15ML IV SOLN
30.0000 mmol | Freq: Once | INTRAVENOUS | Status: AC
  Administered 2023-04-29: 30 mmol via INTRAVENOUS
  Filled 2023-04-29: qty 10

## 2023-04-29 MED ORDER — MAGNESIUM HYDROXIDE 400 MG/5ML PO SUSP
30.0000 mL | Freq: Once | ORAL | Status: AC
  Administered 2023-04-29: 30 mL via ORAL
  Filled 2023-04-29: qty 30

## 2023-04-29 MED ORDER — SORBITOL 70 % SOLN
960.0000 mL | TOPICAL_OIL | Freq: Once | ORAL | Status: AC
  Administered 2023-04-29: 960 mL via RECTAL
  Filled 2023-04-29: qty 240

## 2023-04-29 MED ORDER — SENNA 8.6 MG PO TABS
2.0000 | ORAL_TABLET | Freq: Once | ORAL | Status: AC
  Administered 2023-04-29: 17.2 mg via ORAL
  Filled 2023-04-29: qty 2

## 2023-04-29 MED ORDER — MAGNESIUM CITRATE PO SOLN
1.0000 | Freq: Once | ORAL | Status: AC
  Administered 2023-04-29: 1 via ORAL
  Filled 2023-04-29: qty 296

## 2023-04-29 MED ORDER — BISACODYL 5 MG PO TBEC
10.0000 mg | DELAYED_RELEASE_TABLET | Freq: Once | ORAL | Status: AC
  Administered 2023-04-29: 10 mg via ORAL
  Filled 2023-04-29: qty 2

## 2023-04-29 MED ORDER — VITAMIN D 25 MCG (1000 UNIT) PO TABS
2000.0000 [IU] | ORAL_TABLET | Freq: Every day | ORAL | Status: DC
  Administered 2023-04-29 – 2023-05-13 (×14): 2000 [IU] via ORAL
  Filled 2023-04-29 (×14): qty 2

## 2023-04-29 MED ORDER — LACTULOSE 10 GM/15ML PO SOLN
30.0000 g | Freq: Once | ORAL | Status: AC
  Administered 2023-04-29: 30 g via ORAL
  Filled 2023-04-29: qty 45

## 2023-04-29 NOTE — Progress Notes (Addendum)
Trauma/Critical Care Follow Up Note  Subjective:    Overnight Issues:   Objective:  Vital signs for last 24 hours: Temp:  [97.8 F (36.6 C)-99 F (37.2 C)] 97.8 F (36.6 C) (10/09 0800) Pulse Rate:  [63-93] 80 (10/09 0800) Resp:  [14-26] 25 (10/09 0800) BP: (105-131)/(49-81) 129/60 (10/09 0800) SpO2:  [87 %-100 %] 96 % (10/09 0800) Weight:  [94 kg] 94 kg (10/09 0500)  Hemodynamic parameters for last 24 hours:    Intake/Output from previous day: 10/08 0701 - 10/09 0700 In: -  Out: 950 [Urine:950]  Intake/Output this shift: No intake/output data recorded.  Vent settings for last 24 hours:    Physical Exam:  Gen: comfortable, no distress Neuro: follows commands, alert, communicative HEENT: PERRL Neck: supple CV: RRR Pulm: unlabored breathing on  Abd: soft, NT    GU: urine clear and yellow, +Foley Extr: wwp, no edema  Results for orders placed or performed during the hospital encounter of 04/24/23 (from the past 24 hour(s))  Glucose, capillary     Status: Abnormal   Collection Time: 04/28/23  4:01 PM  Result Value Ref Range   Glucose-Capillary 204 (H) 70 - 99 mg/dL  Magnesium     Status: None   Collection Time: 04/28/23  4:59 PM  Result Value Ref Range   Magnesium 2.2 1.7 - 2.4 mg/dL  Phosphorus     Status: Abnormal   Collection Time: 04/28/23  4:59 PM  Result Value Ref Range   Phosphorus 1.8 (L) 2.5 - 4.6 mg/dL  Glucose, capillary     Status: Abnormal   Collection Time: 04/28/23  7:29 PM  Result Value Ref Range   Glucose-Capillary 181 (H) 70 - 99 mg/dL  Glucose, capillary     Status: Abnormal   Collection Time: 04/28/23 11:33 PM  Result Value Ref Range   Glucose-Capillary 140 (H) 70 - 99 mg/dL  Glucose, capillary     Status: Abnormal   Collection Time: 04/29/23  3:21 AM  Result Value Ref Range   Glucose-Capillary 138 (H) 70 - 99 mg/dL  CBC     Status: Abnormal   Collection Time: 04/29/23  6:24 AM  Result Value Ref Range   WBC 12.6 (H) 4.0 -  10.5 K/uL   RBC 2.64 (L) 4.22 - 5.81 MIL/uL   Hemoglobin 8.3 (L) 13.0 - 17.0 g/dL   HCT 16.1 (L) 09.6 - 04.5 %   MCV 95.8 80.0 - 100.0 fL   MCH 31.4 26.0 - 34.0 pg   MCHC 32.8 30.0 - 36.0 g/dL   RDW 40.9 81.1 - 91.4 %   Platelets 165 150 - 400 K/uL   nRBC 0.2 0.0 - 0.2 %  Basic metabolic panel     Status: Abnormal   Collection Time: 04/29/23  6:24 AM  Result Value Ref Range   Sodium 137 135 - 145 mmol/L   Potassium 5.5 (H) 3.5 - 5.1 mmol/L   Chloride 99 98 - 111 mmol/L   CO2 29 22 - 32 mmol/L   Glucose, Bld 150 (H) 70 - 99 mg/dL   BUN 35 (H) 8 - 23 mg/dL   Creatinine, Ser 7.82 0.61 - 1.24 mg/dL   Calcium 8.8 (L) 8.9 - 10.3 mg/dL   GFR, Estimated >95 >62 mL/min   Anion gap 9 5 - 15  Magnesium     Status: None   Collection Time: 04/29/23  6:24 AM  Result Value Ref Range   Magnesium 2.4 1.7 - 2.4 mg/dL  Phosphorus  Status: Abnormal   Collection Time: 04/29/23  6:24 AM  Result Value Ref Range   Phosphorus 1.8 (L) 2.5 - 4.6 mg/dL  Glucose, capillary     Status: Abnormal   Collection Time: 04/29/23  7:46 AM  Result Value Ref Range   Glucose-Capillary 151 (H) 70 - 99 mg/dL    Assessment & Plan: The plan of care was discussed with the bedside nurse for the day, who is in agreement with this plan and no additional concerns were raised.   Present on Admission:  Pelvic fracture (HCC)    LOS: 5 days   Additional comments:I reviewed the patient's new clinical lab test results.   and I reviewed the patients new imaging test results.    Fall from ladder, 20 ft   Hemmorrhagic shock - resolved Pelvic fractures (R iliac bone, R pubic rami, and right and left sacrum with 10cm diastasis of pubic symphysis) -  Foley and binder placed in trauma bay. OR ortho 10/4 perc fixation of numerous pelvic fxs, ORIF right elbow. BLE - bed to chair transfers per ortho. Open R elbow/olecranon fx - Per Ortho --> I&D 10/4  R 6-12th rib fxs - multimodal pain control, pulm toilet R T10 TP fxs -  multimodal pain control.  Age-indeterminate mild compression fractures of T4, T7 and T12 - discussed with NSGY Dr. Jordan Likes who reviewed images. Does not need OR or brace/spine precautions. Incidental findings - Pulmonary nodules and Severe atherosclerotic calcifications of the left carotid bulb and proximal left cervical ICA. 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 FEN: Heart healthy diet, SLIV. Discontinue Lasix in setting of hypotension. Lactulose for hyperkalemia, sodium phos for hypophosphatemia, escalate bowel regimen ID: Tdap in ED, Ancef in ED for open fracture. VTE: SCDs, LMWH Foley - foley replaced 10/6 for urinary retention. Home Flomax resumed, incr dose 10/8, remove at 2200 tonight Dispo: txf to 4NP   Diamantina Monks, MD Trauma & General Surgery Please use [email or web address] to contact on call provider  04/29/2023  *Care during the described time interval was provided by me. I have reviewed this patient's available data, including medical history, events of note, physical examination and test results as part of my evaluation.

## 2023-04-29 NOTE — Plan of Care (Signed)

## 2023-04-29 NOTE — Progress Notes (Signed)
Inpatient Rehabilitation Admissions Coordinator   I spoke with patient at bedside and then spoke with his wife by phone. Currently no bed at CIR to begin pursuing CIR level rehab for this week. Wife states her second choice would be [facility]. I will contact TOC.  Ottie Glazier, RN, MSN Rehab Admissions Coordinator [phone] 04/29/2023 10:40 AM

## 2023-04-29 NOTE — Progress Notes (Signed)
Orthopaedic Trauma Progress Note  SUBJECTIVE: Doing well today. Notes pain is currently well controlled. Making good progress with therapies. No chest pain. No SOB. No nausea/vomiting. No other complaints.   OBJECTIVE:  Vitals:   04/29/23 1300 04/29/23 1400  BP: 125/61 (!) 148/93  Pulse: 80 87  Resp: (!) 28 (!) 26  Temp:    SpO2: 96% 93%    General: Resting in bed, NAD Respiratory: No increased work of breathing.  RUE: Dressing changed, incision CDI. Non-tender throughout extremity. Motor and sensory function intact distally. Hand warm and well perfused Pelvis/BLE: Incisions R anterior and lateral hip are CDI. Non-tender over these areas. Ankle DF/PF intact bilaterally. +EHL/FHL. Endorses sensation to light touch all aspect of foot bilaterally. +DP pulses, equal  IMAGING: Stable post op imaging.   LABS:  Results for orders placed or performed during the hospital encounter of 04/24/23 (from the past 24 hour(s))  Glucose, capillary     Status: Abnormal   Collection Time: 04/28/23  4:01 PM  Result Value Ref Range   Glucose-Capillary 204 (H) 70 - 99 mg/dL  Magnesium     Status: None   Collection Time: 04/28/23  4:59 PM  Result Value Ref Range   Magnesium 2.2 1.7 - 2.4 mg/dL  Phosphorus     Status: Abnormal   Collection Time: 04/28/23  4:59 PM  Result Value Ref Range   Phosphorus 1.8 (L) 2.5 - 4.6 mg/dL  Glucose, capillary     Status: Abnormal   Collection Time: 04/28/23  7:29 PM  Result Value Ref Range   Glucose-Capillary 181 (H) 70 - 99 mg/dL  Glucose, capillary     Status: Abnormal   Collection Time: 04/28/23 11:33 PM  Result Value Ref Range   Glucose-Capillary 140 (H) 70 - 99 mg/dL  Glucose, capillary     Status: Abnormal   Collection Time: 04/29/23  3:21 AM  Result Value Ref Range   Glucose-Capillary 138 (H) 70 - 99 mg/dL  CBC     Status: Abnormal   Collection Time: 04/29/23  6:24 AM  Result Value Ref Range   WBC 12.6 (H) 4.0 - 10.5 K/uL   RBC 2.64 (L) 4.22 - 5.81  MIL/uL   Hemoglobin 8.3 (L) 13.0 - 17.0 g/dL   HCT 82.9 (L) 56.2 - 13.0 %   MCV 95.8 80.0 - 100.0 fL   MCH 31.4 26.0 - 34.0 pg   MCHC 32.8 30.0 - 36.0 g/dL   RDW 86.5 78.4 - 69.6 %   Platelets 165 150 - 400 K/uL   nRBC 0.2 0.0 - 0.2 %  Basic metabolic panel     Status: Abnormal   Collection Time: 04/29/23  6:24 AM  Result Value Ref Range   Sodium 137 135 - 145 mmol/L   Potassium 5.5 (H) 3.5 - 5.1 mmol/L   Chloride 99 98 - 111 mmol/L   CO2 29 22 - 32 mmol/L   Glucose, Bld 150 (H) 70 - 99 mg/dL   BUN 35 (H) 8 - 23 mg/dL   Creatinine, Ser 2.95 0.61 - 1.24 mg/dL   Calcium 8.8 (L) 8.9 - 10.3 mg/dL   GFR, Estimated >28 >41 mL/min   Anion gap 9 5 - 15  Magnesium     Status: None   Collection Time: 04/29/23  6:24 AM  Result Value Ref Range   Magnesium 2.4 1.7 - 2.4 mg/dL  Phosphorus     Status: Abnormal   Collection Time: 04/29/23  6:24 AM  Result Value  Ref Range   Phosphorus 1.8 (L) 2.5 - 4.6 mg/dL  Glucose, capillary     Status: Abnormal   Collection Time: 04/29/23  7:46 AM  Result Value Ref Range   Glucose-Capillary 151 (H) 70 - 99 mg/dL  Glucose, capillary     Status: Abnormal   Collection Time: 04/29/23 11:32 AM  Result Value Ref Range   Glucose-Capillary 203 (H) 70 - 99 mg/dL    ASSESSMENT: Louis Juarez is a 82 y.o. male, 5 Days Post-Op s/p ORIF PELVIC FRACTURE WITH PERCUTANEOUS SCREWS IRRIGATION AND DEBRIDEMENT RIGHT ELBOW  CV/Blood loss: Acute blood loss anemia, Hgb 8.3 this morning.  Hemodynamically stable  PLAN: Weightbearing:  BLE - Bed to chair transfers x 6 to 8 weeks RUE - WBAT ROM: BLE -unrestricted hip/knee/ankle ROM RUE -unrestricted ROM Incisional and dressing care: Okay to leave RLE incisions open to the air.  Change LUE dressing as needed's S used AED and howering: Okay to begin showering, incisions may get wet Orthopedic device(s): None  Pain management:  1. Tylenol 650 mg q 6 hours scheduled 2. Robaxin 1000 mg q 8 hours PRN 3. Oxycodone 5-10  mg q 4 hours PRN 4. Dilaudid 0.5 mg q 2 hours PRN VTE prophylaxis:  Lovenox , SCDs ID: Ancef 2gm post op per open fracture protocol completed Foley/Lines: Foley in place for urinary retention. KVO IVFs Impediments to Fracture Healing: Vit D level 29, will start on supplementation.  Dispo: Continue care per trauma team.  PT/OT evaluations ongoing, recommending CIR.  Patient okay for discharge next venue from Ortho standpoint once cleared by trauma team and therapies  D/C recommendations: - Oxycodone, Robaxin for pain control - Eliquis 2.5 mg BID x 30 days for DVT prophylaxis -Continue 2000 mg Vit D supplementation daily x 90 days  Follow - up plan: 2 weeks after discharge for wound check and repeat x-rays   Contact information:  Truitt Merle MD, Thyra Breed PA-C. After hours and holidays please check [email or web address] for group call information for [facility]   Thompson Caul, PA-C [phone] (office) [email or web address]

## 2023-04-30 ENCOUNTER — Encounter (HOSPITAL_COMMUNITY): Payer: Self-pay | Admitting: Student

## 2023-04-30 LAB — CBC
HCT: 25.4 % — ABNORMAL LOW (ref 39.0–52.0)
Hemoglobin: 8.2 g/dL — ABNORMAL LOW (ref 13.0–17.0)
MCH: 30.4 pg (ref 26.0–34.0)
MCHC: 32.3 g/dL (ref 30.0–36.0)
MCV: 94.1 fL (ref 80.0–100.0)
Platelets: 213 10*3/uL (ref 150–400)
RBC: 2.7 MIL/uL — ABNORMAL LOW (ref 4.22–5.81)
RDW: 15.2 % (ref 11.5–15.5)
WBC: 16 10*3/uL — ABNORMAL HIGH (ref 4.0–10.5)
nRBC: 0.4 % — ABNORMAL HIGH (ref 0.0–0.2)

## 2023-04-30 LAB — URINALYSIS, ROUTINE W REFLEX MICROSCOPIC
Bilirubin Urine: NEGATIVE
Glucose, UA: NEGATIVE mg/dL
Hgb urine dipstick: NEGATIVE
Ketones, ur: NEGATIVE mg/dL
Leukocytes,Ua: NEGATIVE
Nitrite: NEGATIVE
Protein, ur: NEGATIVE mg/dL
Specific Gravity, Urine: 1.021 (ref 1.005–1.030)
pH: 5 (ref 5.0–8.0)

## 2023-04-30 LAB — BASIC METABOLIC PANEL
Anion gap: 9 (ref 5–15)
BUN: 36 mg/dL — ABNORMAL HIGH (ref 8–23)
CO2: 29 mmol/L (ref 22–32)
Calcium: 8.6 mg/dL — ABNORMAL LOW (ref 8.9–10.3)
Chloride: 96 mmol/L — ABNORMAL LOW (ref 98–111)
Creatinine, Ser: 0.91 mg/dL (ref 0.61–1.24)
GFR, Estimated: >60 mL/min (ref >60)
Glucose, Bld: 170 mg/dL — ABNORMAL HIGH (ref 70–99)
Potassium: 4.5 mmol/L (ref 3.5–5.1)
Sodium: 134 mmol/L — ABNORMAL LOW (ref 135–145)

## 2023-04-30 LAB — PHOSPHORUS: Phosphorus: 3.3 mg/dL (ref 2.5–4.6)

## 2023-04-30 LAB — GLUCOSE, CAPILLARY
Glucose-Capillary: 157 mg/dL — ABNORMAL HIGH (ref 70–99)
Glucose-Capillary: 157 mg/dL — ABNORMAL HIGH (ref 70–99)
Glucose-Capillary: 170 mg/dL — ABNORMAL HIGH (ref 70–99)
Glucose-Capillary: 174 mg/dL — ABNORMAL HIGH (ref 70–99)
Glucose-Capillary: 193 mg/dL — ABNORMAL HIGH (ref 70–99)
Glucose-Capillary: 170 mg/dL — ABNORMAL HIGH (ref 70–99)

## 2023-04-30 MED ORDER — ZOLPIDEM TARTRATE 5 MG PO TABS
5.0000 mg | ORAL_TABLET | Freq: Every evening | ORAL | Status: DC | PRN
  Administered 2023-04-30: 5 mg via ORAL
  Filled 2023-04-30: qty 1

## 2023-04-30 MED ORDER — SODIUM CHLORIDE 0.9% FLUSH
10.0000 mL | Freq: Two times a day (BID) | INTRAVENOUS | Status: DC
  Administered 2023-04-30 – 2023-05-07 (×16): 10 mL via INTRAVENOUS

## 2023-04-30 MED ORDER — QUETIAPINE FUMARATE 25 MG PO TABS
50.0000 mg | ORAL_TABLET | Freq: Every evening | ORAL | Status: DC | PRN
  Administered 2023-04-30 – 2023-05-11 (×6): 50 mg via ORAL
  Filled 2023-04-30 (×3): qty 1
  Filled 2023-04-30: qty 2
  Filled 2023-04-30 (×2): qty 1

## 2023-04-30 MED ORDER — MELATONIN 3 MG PO TABS
3.0000 mg | ORAL_TABLET | Freq: Every day | ORAL | Status: DC
  Administered 2023-04-30 – 2023-05-12 (×13): 3 mg via ORAL
  Filled 2023-04-30 (×13): qty 1

## 2023-04-30 NOTE — Progress Notes (Signed)
Trauma/Critical Care Follow Up Note  Subjective:    Overnight Issues:   Objective:  Vital signs for last 24 hours: Temp:  [98.2 F (36.8 C)-99.8 F (37.7 C)] 98.5 F (36.9 C) (10/10 0800) Pulse Rate:  [66-87] 80 (10/10 0800) Resp:  [17-29] 28 (10/10 0800) BP: (108-148)/(50-93) 133/62 (10/10 0800) SpO2:  [93 %-98 %] 98 % (10/10 0800) Weight:  [92.1 kg] 92.1 kg (10/10 0500)  Hemodynamic parameters for last 24 hours:    Intake/Output from previous day: 10/09 0701 - 10/10 0700 In: 292.2 [IV Piggyback:292.2] Out: 915 [Urine:915]  Intake/Output this shift: No intake/output data recorded.  Vent settings for last 24 hours:    Physical Exam:  Gen: comfortable, no distress Neuro: follows commands, alert, communicative HEENT: PERRL Neck: supple CV: RRR Pulm: unlabored breathing on mechanical ventilation-pressure support Abd: soft, NT    GU: urine clear and yellow, +spontaneous voids Extr: wwp, no edema  Results for orders placed or performed during the hospital encounter of 04/24/23 (from the past 24 hour(s))  Glucose, capillary     Status: Abnormal   Collection Time: 04/29/23 11:32 AM  Result Value Ref Range   Glucose-Capillary 203 (H) 70 - 99 mg/dL  Glucose, capillary     Status: Abnormal   Collection Time: 04/29/23  3:23 PM  Result Value Ref Range   Glucose-Capillary 159 (H) 70 - 99 mg/dL  Glucose, capillary     Status: Abnormal   Collection Time: 04/29/23  7:30 PM  Result Value Ref Range   Glucose-Capillary 198 (H) 70 - 99 mg/dL  Glucose, capillary     Status: Abnormal   Collection Time: 04/29/23 11:34 PM  Result Value Ref Range   Glucose-Capillary 181 (H) 70 - 99 mg/dL  Glucose, capillary     Status: Abnormal   Collection Time: 04/30/23  3:18 AM  Result Value Ref Range   Glucose-Capillary 157 (H) 70 - 99 mg/dL  CBC     Status: Abnormal   Collection Time: 04/30/23  7:55 AM  Result Value Ref Range   WBC 16.0 (H) 4.0 - 10.5 K/uL   RBC 2.70 (L) 4.22 - 5.81  MIL/uL   Hemoglobin 8.2 (L) 13.0 - 17.0 g/dL   HCT 95.2 (L) 84.1 - 32.4 %   MCV 94.1 80.0 - 100.0 fL   MCH 30.4 26.0 - 34.0 pg   MCHC 32.3 30.0 - 36.0 g/dL   RDW 40.1 02.7 - 25.3 %   Platelets 213 150 - 400 K/uL   nRBC 0.4 (H) 0.0 - 0.2 %  Basic metabolic panel     Status: Abnormal   Collection Time: 04/30/23  7:55 AM  Result Value Ref Range   Sodium 134 (L) 135 - 145 mmol/L   Potassium 4.5 3.5 - 5.1 mmol/L   Chloride 96 (L) 98 - 111 mmol/L   CO2 29 22 - 32 mmol/L   Glucose, Bld 170 (H) 70 - 99 mg/dL   BUN 36 (H) 8 - 23 mg/dL   Creatinine, Ser 6.64 0.61 - 1.24 mg/dL   Calcium 8.6 (L) 8.9 - 10.3 mg/dL   GFR, Estimated >40 >34 mL/min   Anion gap 9 5 - 15  Glucose, capillary     Status: Abnormal   Collection Time: 04/30/23  8:10 AM  Result Value Ref Range   Glucose-Capillary 157 (H) 70 - 99 mg/dL    Assessment & Plan: The plan of care was discussed with the bedside nurse for the day, who is in agreement  with this plan and no additional concerns were raised.   Present on Admission:  Pelvic fracture (HCC)    LOS: 6 days   Additional comments:I reviewed the patient's new clinical lab test results.   and I reviewed the patients new imaging test results.    Fall from ladder, 20 ft   Hemmorrhagic shock - resolved Pelvic fractures (R iliac bone, R pubic rami, and right and left sacrum with 10cm diastasis of pubic symphysis) -  Foley and binder placed in trauma bay. OR ortho 10/4 perc fixation of numerous pelvic fxs, ORIF right elbow. BLE - bed to chair transfers per ortho. Open R elbow/olecranon fx - Per Ortho --> I&D 10/4  R 6-12th rib fxs - multimodal pain control, pulm toilet R T10 TP fxs - multimodal pain control.  Age-indeterminate mild compression fractures of T4, T7 and T12 - discussed with NSGY Dr. Jordan Likes who reviewed images. Does not need OR or brace/spine precautions. Incidental findings - Pulmonary nodules and Severe atherosclerotic calcifications of the left carotid  bulb and proximal left cervical ICA. 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 FEN: Heart healthy diet, SLIV. +BM  ID: Tdap in ED, Ancef in ED for open fracture. VTE: SCDs, LMWH Foley - foley removed 10/9, voiding, on flomax double home dose  Dispo: txf to 4NP, melatonin/seroquel HS for sleep   Diamantina Monks, MD Trauma & General Surgery Please use [email or web address] to contact on call provider  04/30/2023  *Care during the described time interval was provided by me. I have reviewed this patient's available data, including medical history, events of note, physical examination and test results as part of my evaluation.

## 2023-04-30 NOTE — Progress Notes (Signed)
Physical Therapy Treatment Patient Details Name: Louis Juarez MRN: [id number] DOB: 25-Mar-1941 Today's Date: 04/30/2023   History of Present Illness 82 y.o. male presents to [facility] hospital on 04/24/2023 after fall from ladder. Pt found to have open book pelvic fx, open R olceranon fx, R rib 6-12 fx, R T10 TP fx, and age-indeterminate mild compression fxs of T4, T7 and T12. Pt underwent percutaneous fixation of pelvic fxs and open treatment of R olecranon fx on 10/4. PMH includes heart failure, ICD.    PT Comments  Patient limited this session due to pain reporting he "overdid" it earlier.  Noted per RN pt had attempted up on his own and she had to place safety restraint.  Patient will need continued skilled PT in the acute setting and continued intensive inpatient rehab prior to d/c home.    If plan is discharge home, recommend the following: Two people to help with walking and/or transfers;Two people to help with bathing/dressing/bathroom;Assistance with cooking/housework;Assist for transportation;Help with stairs or ramp for entrance   Can travel by private vehicle        Equipment Recommendations  Wheelchair (measurements PT);Wheelchair cushion (measurements PT);Hospital bed;Hoyer lift    Recommendations for Other Services       Precautions / Restrictions Precautions Precautions: Fall Restrictions Weight Bearing Restrictions: Yes RUE Weight Bearing: Weight bearing as tolerated RLE Weight Bearing: Weight bearing as tolerated LLE Weight Bearing: Weight bearing as tolerated     Mobility  Bed Mobility Overal bed mobility: Needs Assistance Bed Mobility: Rolling Rolling: +2 for physical assistance, Used rails, Mod assist         General bed mobility comments: declined sitting EOB or standing attempts, encouraged OOB at least via lift due to audible crackles in lungs    Transfers Overall transfer level: Needs assistance   Transfers: Bed to chair/wheelchair/BSC              General transfer comment: up to recliner via maxisky Transfer via Lift Equipment: [employer]  Ambulation/Gait                   Stairs             Wheelchair Mobility     Tilt Bed    Modified Rankin (Stroke Patients Only)       Balance Overall balance assessment: Needs assistance                                          Cognition Arousal: Alert Behavior During Therapy: WFL for tasks assessed/performed Overall Cognitive Status: Impaired/Different from baseline Area of Impairment: Orientation, Following commands, Safety/judgement                 Orientation Level: Disoriented to, Place, Time, Situation     Following Commands: Follows one step commands with increased time Safety/Judgement: Decreased awareness of deficits, Decreased awareness of safety              Exercises General Exercises - Lower Extremity Ankle Circles/Pumps: AROM, Both, 5 reps, Supine    General Comments General comments (skin integrity, edema, etc.): 2L [place] O2 with VSS      Pertinent Vitals/Pain Pain Assessment Pain Assessment: Faces Faces Pain Scale: Hurts whole lot Pain Location: low back with mobility Pain Descriptors / Indicators: Moaning, Grimacing Pain Intervention(s): Monitored during session, Patient requesting pain meds-RN notified, Repositioned, Limited activity within patient's tolerance  Home Living                          Prior Function            PT Goals (current goals can now be found in the care plan section) Progress towards PT goals: Not progressing toward goals - comment    Frequency           PT Plan      Co-evaluation              AM-PAC PT "6 Clicks" Mobility   Outcome Measure  Help needed turning from your back to your side while in a flat bed without using bedrails?: A Lot Help needed moving from lying on your back to sitting on the side of a flat bed without using bedrails?:  Total Help needed moving to and from a bed to a chair (including a wheelchair)?: Total Help needed standing up from a chair using your arms (e.g., wheelchair or bedside chair)?: Total Help needed to walk in hospital room?: Total Help needed climbing 3-5 steps with a railing? : Total 6 Click Score: 7    End of Session Equipment Utilized During Treatment: Oxygen Activity Tolerance: Patient limited by pain Patient left: with chair alarm set;with call bell/phone within reach Nurse Communication: Mobility status;Need for lift equipment PT Visit Diagnosis: Other abnormalities of gait and mobility (R26.89);Muscle weakness (generalized) (M62.81);Pain Pain - Right/Left: Right Pain - part of body: Hip     Time: 1046-1100 PT Time Calculation (min) (ACUTE ONLY): 14 min  Charges:    $Therapeutic Activity: 8-22 mins PT General Charges $$ ACUTE PT VISIT: 1 Visit                     Sheran Lawless, PT Acute Rehabilitation Services Office:[phone] 04/30/2023    Louis Juarez 04/30/2023, 4:53 PM

## 2023-04-30 NOTE — Progress Notes (Signed)
Inpatient Rehabilitation Admissions Coordinator   CIR beds will be available first of week. I will begin Auth with [facility] for possible Cir admit as wife prefers [facility]. TOC made aware.  Ottie Glazier, RN, MSN Rehab Admissions Coordinator [phone] 04/30/2023 2:18 PM

## 2023-04-30 NOTE — Progress Notes (Signed)
Pt transferred to 4NP01 from 4NICU. Pt on monitor, vitals WNL.  Robina Ade, RN

## 2023-04-30 NOTE — TOC Initial Note (Signed)
Transition of Care [facility]) - Initial/Assessment Note    Patient Details  Name: Louis Juarez MRN: [id number] Date of Birth: 1941/06/15  Transition of Care [facility]) CM/SW Contact:    Glennon Mac, RN Phone Number: 04/30/2023, 10:01am Clinical Narrative:                 82 y.o. male presents to [facility] hospital on 04/24/2023 after fall from ladder. Pt found to have open book pelvic fx, open R olceranon fx, R rib 6-12 fx, R T10 TP fx, and age-indeterminate mild compression fxs of T4, T7 and T12. Pt underwent percutaneous fixation of pelvic fxs and open treatment of R olecranon fx on 10/4.  Prior to admission, patient independent and living at home with spouse, who can provide 24h assistance at discharge.  PT/OT recommending CIR, and this is preferred by patient/wife. Currently no beds available at [facility] inpatient rehab; wife agreeable to [facility] inpatient rehab as second choice.  Faxed referral to Leafy Kindle, admissions coordinator at [facility] inpatient rehab.  Addendum: 2:38 PM Received call from Leafy Kindle: She states that patient has been denied for admission to [facility] inpatient rehab, as they feel he cannot tolerate required amount of therapy.  Received message from [facility] Inpatient Rehab admissions coordinator; they are expecting beds at the first of next week.  CIR AC to begin insurance authorization for admission to [facility] inpatient rehab.  Expected Discharge Plan: IP Rehab Facility Barriers to Discharge: Continued Medical Work up   Patient Goals and CMS Choice   CMS Medicare.gov Compare Post Acute Care list provided to:: Patient Represenative (must comment) (wife) Choice offered to / list presented to : Spouse      Expected Discharge Plan and Services   Discharge Planning Services: CM Consult Post Acute Care Choice: IP Rehab Living arrangements for the past 2 months: Single Family Home                                       Prior Living Arrangements/Services Living arrangements for the past 2 months: Single Family Home Lives with:: Spouse Patient language and need for interpreter reviewed:: Yes Do you feel safe going back to the place where you live?: Yes      Need for Family Participation in Patient Care: Yes (Comment) Care giver support system in place?: Yes (comment)   Criminal Activity/Legal Involvement Pertinent to Current Situation/Hospitalization: No - Comment as needed               Emotional Assessment Appearance:: Appears stated age Attitude/Demeanor/Rapport: Engaged Affect (typically observed): Accepting Orientation: : Oriented to Self, Oriented to Place, Oriented to  Time, Oriented to Situation      Admission diagnosis:  Pelvic fracture (HCC) [S32.9XXA] Fall, initial encounter [id number]] Closed displaced fracture of pelvis, unspecified part of pelvis, initial encounter (HCC) [S32.9XXA] Hypotension due to hypovolemia [E86.1] Patient Active Problem List   Diagnosis Date Noted   Pelvic fracture (HCC) 04/24/2023   PCP:  Vivien Presto, MD Pharmacy:   [id number] - [place], [place] - [address] [place] [address] [place] [address] Phone: [phone] Fax: [phone]     Social Determinants of Health (SDOH) Social History: SDOH Screenings   Food Insecurity: No Food Insecurity (04/24/2023)  Housing: Low Risk  (04/24/2023)  Transportation Needs: No Transportation Needs (04/24/2023)  Utilities:  Not At Risk (04/24/2023)  Tobacco Use: Medium Risk (04/24/2023)   SDOH Interventions:     Readmission Risk Interventions     No data to display         Quintella Baton, RN, BSN  Trauma/Neuro ICU Case Manager [phone]

## 2023-05-01 ENCOUNTER — Inpatient Hospital Stay (HOSPITAL_COMMUNITY): Payer: BC Managed Care – PPO

## 2023-05-01 LAB — RENAL FUNCTION PANEL
Albumin: 2.1 g/dL — ABNORMAL LOW (ref 3.5–5.0)
Anion gap: 9 (ref 5–15)
BUN: 42 mg/dL — ABNORMAL HIGH (ref 8–23)
CO2: 30 mmol/L (ref 22–32)
Calcium: 8.2 mg/dL — ABNORMAL LOW (ref 8.9–10.3)
Chloride: 94 mmol/L — ABNORMAL LOW (ref 98–111)
Creatinine, Ser: 1.06 mg/dL (ref 0.61–1.24)
GFR, Estimated: >60 mL/min (ref >60)
Glucose, Bld: 162 mg/dL — ABNORMAL HIGH (ref 70–99)
Phosphorus: 3.4 mg/dL (ref 2.5–4.6)
Potassium: 4.9 mmol/L (ref 3.5–5.1)
Sodium: 133 mmol/L — ABNORMAL LOW (ref 135–145)

## 2023-05-01 LAB — MAGNESIUM: Magnesium: 2.8 mg/dL — ABNORMAL HIGH (ref 1.7–2.4)

## 2023-05-01 LAB — CBC
HCT: 26.5 % — ABNORMAL LOW (ref 39.0–52.0)
Hemoglobin: 8.7 g/dL — ABNORMAL LOW (ref 13.0–17.0)
MCH: 31.4 pg (ref 26.0–34.0)
MCHC: 32.8 g/dL (ref 30.0–36.0)
MCV: 95.7 fL (ref 80.0–100.0)
Platelets: 285 10*3/uL (ref 150–400)
RBC: 2.77 MIL/uL — ABNORMAL LOW (ref 4.22–5.81)
RDW: 15.5 % (ref 11.5–15.5)
WBC: 20.4 10*3/uL — ABNORMAL HIGH (ref 4.0–10.5)
nRBC: 0.2 % (ref 0.0–0.2)

## 2023-05-01 LAB — RESP PANEL BY RT-PCR (RSV, FLU A&B, COVID)  RVPGX2
Influenza A by PCR: NEGATIVE
Influenza B by PCR: NEGATIVE
Resp Syncytial Virus by PCR: NEGATIVE
SARS Coronavirus 2 by RT PCR: NEGATIVE

## 2023-05-01 LAB — GLUCOSE, CAPILLARY
Glucose-Capillary: 153 mg/dL — ABNORMAL HIGH (ref 70–99)
Glucose-Capillary: 164 mg/dL — ABNORMAL HIGH (ref 70–99)
Glucose-Capillary: 174 mg/dL — ABNORMAL HIGH (ref 70–99)

## 2023-05-01 MED ORDER — CHLORHEXIDINE GLUCONATE CLOTH 2 % EX PADS: 6.0000 | MEDICATED_PAD | Freq: Every day | CUTANEOUS | Status: DC

## 2023-05-01 MED ORDER — CHLORHEXIDINE GLUCONATE CLOTH 2 % EX PADS
6.0000 | MEDICATED_PAD | Freq: Every day | CUTANEOUS | Status: DC
  Administered 2023-05-02 – 2023-05-13 (×11): 6 via TOPICAL

## 2023-05-01 NOTE — Progress Notes (Signed)
Trauma/Critical Care Follow Up Note  Subjective:    Patient complains of some worsening congestion this morning.  Objective:  Vital signs for last 24 hours: Temp:  [97.3 F (36.3 C)-99.1 F (37.3 C)] 99.1 F (37.3 C) (10/11 0758) Pulse Rate:  [73-89] 87 (10/11 0758) Resp:  [18-31] 20 (10/11 0758) BP: (111-134)/(46-63) 128/62 (10/11 0758) SpO2:  [80 %-99 %] 99 % (10/11 0758) Weight:  [89.6 kg] 89.6 kg (10/11 0500)  Intake/Output from previous day: 10/10 0701 - 10/11 0700 In: -  Out: 1350 [Urine:1150; Stool:200]  Intake/Output this shift: Total I/O In: 75 [P.O.:75] Out: -   Physical Exam:  Gen: comfortable, no distress Neuro: follows commands, alert, communicative HEENT: PERRL Neck: supple CV: RRR Pulm: equal chest rise with some ronchi bilaterally, on 2L [place], pulling ~1000 on IS Abd: soft, nontender, mildly distended GU: no foley Extr: wwp, no edema  Results for orders placed or performed during the hospital encounter of 04/24/23 (from the past 24 hour(s))  Glucose, capillary     Status: Abnormal   Collection Time: 04/30/23 11:25 AM  Result Value Ref Range   Glucose-Capillary 170 (H) 70 - 99 mg/dL  Urinalysis, Routine w reflex microscopic -Urine, Catheterized     Status: Abnormal   Collection Time: 04/30/23  1:35 PM  Result Value Ref Range   Color, Urine AMBER (A) YELLOW   APPearance CLEAR CLEAR   Specific Gravity, Urine 1.021 1.005 - 1.030   pH 5.0 5.0 - 8.0   Glucose, UA NEGATIVE NEGATIVE mg/dL   Hgb urine dipstick NEGATIVE NEGATIVE   Bilirubin Urine NEGATIVE NEGATIVE   Ketones, ur NEGATIVE NEGATIVE mg/dL   Protein, ur NEGATIVE NEGATIVE mg/dL   Nitrite NEGATIVE NEGATIVE   Leukocytes,Ua NEGATIVE NEGATIVE  Glucose, capillary     Status: Abnormal   Collection Time: 04/30/23  3:18 PM  Result Value Ref Range   Glucose-Capillary 174 (H) 70 - 99 mg/dL  Glucose, capillary     Status: Abnormal   Collection Time: 04/30/23  7:21 PM  Result Value Ref Range    Glucose-Capillary 193 (H) 70 - 99 mg/dL  Glucose, capillary     Status: Abnormal   Collection Time: 04/30/23 11:05 PM  Result Value Ref Range   Glucose-Capillary 170 (H) 70 - 99 mg/dL  Glucose, capillary     Status: Abnormal   Collection Time: 05/01/23  3:06 AM  Result Value Ref Range   Glucose-Capillary 153 (H) 70 - 99 mg/dL    Assessment & Plan:  LOS: 7 days  Fall from ladder, 20 ft   Hemmorrhagic shock - resolved Pelvic fractures (R iliac bone, R pubic rami, and right and left sacrum with 10cm diastasis of pubic symphysis) -  Foley and binder placed in trauma bay. OR ortho 10/4 perc fixation of numerous pelvic fxs, ORIF right elbow. BLE - bed to chair transfers per ortho. Open R elbow/olecranon fx - Per Ortho --> I&D 10/4  R 6-12th rib fxs - multimodal pain control, pulm toilet,  - Will obtain CXR given breath sounds and increased congestion R T10 TP fxs - multimodal pain control.  Age-indeterminate mild compression fractures of T4, T7 and T12 - discussed with NSGY Dr. Jordan Likes who reviewed images. Does not need OR or brace/spine precautions. Incidental findings - Pulmonary nodules and Severe atherosclerotic calcifications of the left carotid bulb and proximal left cervical ICA. 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 FEN: Heart healthy diet, SLIV. +BM  ID: Tdap in ED, Ancef in ED for open fracture.  - AM labs ordered, will follow up to follow WBC trend VTE: SCDs, LMWH Foley - foley removed 10/9, voiding, on flomax double home dose  Dispo: 4NP, melatonin/seroquel HS for sleep   Donata Duff, MD [facility]  05/01/2023

## 2023-05-01 NOTE — Progress Notes (Signed)
   05/01/23 1052  Assess: MEWS Score  Temp 97.6 F (36.4 C)  BP 101/75  MAP (mmHg) 83  Pulse Rate 81  ECG Heart Rate 81  Resp (!) 26  SpO2 93 %  O2 Device Nasal Cannula  O2 Flow Rate (L/min) 2 L/min  Assess: MEWS Score  MEWS Temp 0  MEWS Systolic 0  MEWS Pulse 0  MEWS RR 2  MEWS LOC 0  MEWS Score 2  MEWS Score Color Yellow  Assess: if the MEWS score is Yellow or Red  Were vital signs accurate and taken at a resting state? Yes  Does the patient meet 2 or more of the SIRS criteria? No  MEWS guidelines implemented  Yes, yellow  Treat  MEWS Interventions Considered administering scheduled or prn medications/treatments as ordered  Take Vital Signs  Increase Vital Sign Frequency  Yellow: Q2hr x1, continue Q4hrs until patient remains green for 12hrs  Escalate  MEWS: Escalate Yellow: Discuss with charge nurse and consider notifying provider and/or RRT  Notify: Charge Nurse/RN  Name of Charge Nurse/RN Notified Aguila, RN  Provider Notification  Provider Name/Title Lendon Collar  Date Provider Notified 05/01/23  Time Provider Notified 0730  Method of Notification Rounds  Notification Reason Change in status;Other (Comment) (respiratory pattern)  Provider response In department  Date of Provider Response 05/01/23  Time of Provider Response 0730  Assess: SIRS CRITERIA  SIRS Temperature  0  SIRS Pulse 0  SIRS Respirations  1  SIRS WBC 0  SIRS Score Sum  1

## 2023-05-01 NOTE — Progress Notes (Signed)
Rectal pouch removed.  BM frequency decreased.  Will continue to monitor.

## 2023-05-01 NOTE — Plan of Care (Signed)

## 2023-05-01 NOTE — Discharge Instructions (Addendum)
Orthopaedic Trauma Service Discharge Instructions   General Discharge Instructions  WEIGHT BEARING STATUS: weightbearing as tolerated through right elbow. Weightbearing as tolerated bilateral lower extremities for transfers only  RANGE OF MOTION/ACTIVITY: Ok for unrestricted hip and right elbow motion  Wound Care: You may remove your surgical dressings. Incisions can be left open to air if there is no drainage. Once the incisions are completely dry and without drainage, they may be left open to air out.  Showering may begin starting post op day 4, (04/28/23).  Clean incision gently with soap and water.  DVT/PE prophylaxis: Eliquis 2.5 mg twice daily x 30 days  Diet: as you were eating previously.  Can use over the counter stool softeners and bowel preparations, such as Miralax, to help with bowel movements.  Narcotics can be constipating.  Be sure to drink plenty of fluids  PAIN MEDICATION USE AND EXPECTATIONS  You have likely been given narcotic medications to help control your pain.  After a traumatic event that results in an fracture (broken bone) with or without surgery, it is ok to use narcotic pain medications to help control one's pain.  We understand that everyone responds to pain differently and each individual patient will be evaluated on a regular basis for the continued need for narcotic medications. Ideally, narcotic medication use should last no more than 6-8 weeks (coinciding with fracture healing).   As a patient it is your responsibility as well to monitor narcotic medication use and report the amount and frequency you use these medications when you come to your office visit.   We would also advise that if you are using narcotic medications, you should take a dose prior to therapy to maximize you participation.  IF YOU ARE ON NARCOTIC MEDICATIONS IT IS NOT PERMISSIBLE TO OPERATE A MOTOR VEHICLE (MOTORCYCLE/CAR/TRUCK/MOPED) OR HEAVY MACHINERY DO NOT MIX NARCOTICS WITH OTHER  CNS (CENTRAL NERVOUS SYSTEM) DEPRESSANTS SUCH AS ALCOHOL   STOP SMOKING OR USING NICOTINE PRODUCTS!!!!  As discussed nicotine severely impairs your body's ability to heal surgical and traumatic wounds but also impairs bone healing.  Wounds and bone heal by forming microscopic blood vessels (angiogenesis) and nicotine is a vasoconstrictor (essentially, shrinks blood vessels).  Therefore, if vasoconstriction occurs to these microscopic blood vessels they essentially disappear and are unable to deliver necessary nutrients to the healing tissue.  This is one modifiable factor that you can do to dramatically increase your chances of healing your injury.    (This means no smoking, no nicotine gum, patches, etc)  DO NOT USE NONSTEROIDAL ANTI-INFLAMMATORY DRUGS (NSAID'S)  Using products such as Advil (ibuprofen), Aleve (naproxen), Motrin (ibuprofen) for additional pain control during fracture healing can delay and/or prevent the healing response.  If you would like to take over the counter (OTC) medication, Tylenol (acetaminophen) is ok.  However, some narcotic medications that are given for pain control contain acetaminophen as well. Therefore, you should not exceed more than 4000 mg of tylenol in a day if you do not have liver disease.  Also note that there are may OTC medicines, such as cold medicines and allergy medicines that my contain tylenol as well.  If you have any questions about medications and/or interactions please ask your doctor/PA or your pharmacist.      ICE AND ELEVATE INJURED/OPERATIVE EXTREMITY  Using ice and elevating the injured extremity above your heart can help with swelling and pain control.  Icing in a pulsatile fashion, such as 20 minutes on and 20 minutes  off, can be followed.    Do not place ice directly on skin. Make sure there is a barrier between to skin and the ice pack.    Using frozen items such as frozen peas works well as the conform nicely to the are that needs to be  iced.  USE AN ACE WRAP OR TED HOSE FOR SWELLING CONTROL  In addition to icing and elevation, Ace wraps or TED hose are used to help limit and resolve swelling.  It is recommended to use Ace wraps or TED hose until you are informed to stop.    When using Ace Wraps start the wrapping distally (farthest away from the body) and wrap proximally (closer to the body)   Example: If you had surgery on your leg or thing and you do not have a splint on, start the ace wrap at the toes and work your way up to the thigh        If you had surgery on your upper extremity and do not have a splint on, start the ace wrap at your fingers and work your way up to the upper arm   CALL THE OFFICE WITH ANY QUESTIONS OR CONCERNS: [phone]   VISIT OUR WEBSITE FOR ADDITIONAL INFORMATION: [email or web address]   Discharge Wound Care Instructions  Do NOT apply any ointments, solutions or lotions to pin sites or surgical wounds.  These prevent needed drainage and even though solutions like hydrogen peroxide kill bacteria, they also damage cells lining the pin sites that help fight infection.  Applying lotions or ointments can keep the wounds moist and can cause them to breakdown and open up as well. This can increase the risk for infection. When in doubt call the office.   If any drainage is noted, use one layer of adaptic or Mepitel, then gauze, Kerlix, and an ace wrap. - These dressing supplies should be available at local medical supply stores [facility], [facility], etc) as well as Insurance claims handler ([facility], [facility], [place], etc)  Once the incision is completely dry and without drainage, it may be left open to air out.  Showering may begin 36-48 hours later.  Cleaning gently with soap and water.  Traumatic wounds should be dressed daily as well.    One layer of adaptic, gauze, Kerlix, then ace wrap.  The adaptic can be discontinued once the draining has ceased    If you have a wet to dry dressing: wet the  gauze with saline the squeeze as much saline out so the gauze is moist (not soaking wet), place moistened gauze over wound, then place a dry gauze over the moist one, followed by Kerlix wrap, then ace wrap.

## 2023-05-01 NOTE — Progress Notes (Signed)
Occupational Therapy Treatment Patient Details Name: Louis Juarez MRN: [id number] DOB: Nov 25, 1940 Today's Date: 05/01/2023   History of present illness 82 y.o. male presents to [facility] hospital on 04/24/2023 after fall from ladder. Pt found to have open book pelvic fx, open R olceranon fx, R rib 6-12 fx, R T10 TP fx, and age-indeterminate mild compression fxs of T4, T7 and T12. Pt underwent percutaneous fixation of pelvic fxs and open treatment of R olecranon fx on 10/4. PMH includes heart failure, ICD.   OT comments  Patient continues to make steady progress towards goals in skilled OT session. Patient's session encompassed lateral scoot transfer to recliner as patient continues to demonstrate increased difficulty to put weight through RLE. Patient with decreased O2 needs (down to 3L O2) and able to complete transfer at min A +2 level. Educated on flutter valve at end of session with demonstration noted. OT recommendations remain appropriate, will continue to follow.       If plan is discharge home, recommend the following:  Two people to help with walking and/or transfers;A lot of help with bathing/dressing/bathroom;Assistance with cooking/housework;Assist for transportation;Help with stairs or ramp for entrance;Supervision due to cognitive status   Equipment Recommendations  Other (comment) (defer to next venue)    Recommendations for Other Services      Precautions / Restrictions Precautions Precautions: Fall Restrictions Weight Bearing Restrictions: Yes RUE Weight Bearing: Weight bearing as tolerated RLE Weight Bearing: Weight bearing as tolerated LLE Weight Bearing: Weight bearing as tolerated Other Position/Activity Restrictions: bil LE WBAT for transfers only       Mobility Bed Mobility Overal bed mobility: Needs Assistance Bed Mobility: Rolling, Sidelying to Sit Rolling: Mod assist, +2 for safety/equipment, Used rails Sidelying to sit: Mod assist, +2 for safety/equipment,  HOB elevated, Used rails       General bed mobility comments: cues to initiate reaching for bed rail and walking bil LE's off EOB, mod assist with bed pad to complete pivot. pt attempting to initiate press up through Rt UE to sit.    Transfers Overall transfer level: Needs assistance Equipment used:  (bed rails) Transfers: Sit to/from Stand, Bed to chair/wheelchair/BSC Sit to Stand: Min assist (with bed rails for UE support from recliner)          Lateral/Scoot Transfers: Min assist, +2 safety/equipment General transfer comment: Cues for sequencing lateral scoot bed>chair, EOB slightly elevated compared to recliner height. Min assist with bed pad for lift and to prevent excessive anterior lean. recliner positioned in front of sidd of bed, pt using bil UE on bed rail to rise to stand, min assist for power up and steady.     Balance Overall balance assessment: Needs assistance Sitting-balance support: Feet supported, Bilateral upper extremity supported Sitting balance-Leahy Scale: Fair Sitting balance - Comments: anterior and L lateral lean as pt reports significant pain with weight shift onto R pelvis   Standing balance support: Bilateral upper extremity supported, Reliant on assistive device for balance Standing balance-Leahy Scale: Poor                             ADL either performed or assessed with clinical judgement   ADL Overall ADL's : Needs assistance/impaired Eating/Feeding: Set up;Sitting   Grooming: Wash/dry hands;Wash/dry face;Set up;Sitting                   Toilet Transfer: Moderate assistance;+2 for safety/equipment;+2 for physical assistance Toilet Transfer Details (indicate  cue type and reason): lateral scoot transfer to chair (simulated) Toileting- Clothing Manipulation and Hygiene: Total assistance;Sitting/lateral lean;Sit to/from stand Toileting - Clothing Manipulation Details (indicate cue type and reason): unable to complete peri-care  in standing     Functional mobility during ADLs: Moderate assistance;Cueing for sequencing;Cueing for safety;+2 for physical assistance;+2 for safety/equipment General ADL Comments: Session focus on ADLs and lateral scoot transfers to recliner to increase overall independence.    Extremity/Trunk Assessment              Vision       Perception     Praxis      Cognition Arousal: Alert Behavior During Therapy: WFL for tasks assessed/performed Overall Cognitive Status: Impaired/Different from baseline                                          Exercises      Shoulder Instructions       General Comments significant bruising around side of back and abdomen/hip.    Pertinent Vitals/ Pain          Home Living                                          Prior Functioning/Environment              Frequency  Min 1X/week        Progress Toward Goals  OT Goals(current goals can now be found in the care plan section)  Progress towards OT goals: Progressing toward goals  Acute Rehab OT Goals Patient Stated Goal: to get better OT Goal Formulation: With patient Time For Goal Achievement: 05/09/23 Potential to Achieve Goals: Good  Plan      Co-evaluation      Reason for Co-Treatment: For patient/therapist safety PT goals addressed during session: Mobility/safety with mobility;Balance        AM-PAC OT "6 Clicks" Daily Activity     Outcome Measure   Help from another person eating meals?: A Little Help from another person taking care of personal grooming?: A Little Help from another person toileting, which includes using toliet, bedpan, or urinal?: A Lot Help from another person bathing (including washing, rinsing, drying)?: A Lot Help from another person to put on and taking off regular upper body clothing?: A Lot Help from another person to put on and taking off regular lower body clothing?: Total 6 Click Score: 13     End of Session Equipment Utilized During Treatment: Gait belt;Oxygen  OT Visit Diagnosis: Unsteadiness on feet (R26.81);Muscle weakness (generalized) (M62.81) Pain - Right/Left: Right   Activity Tolerance Patient tolerated treatment well   Patient Left in chair;with call bell/phone within reach;with chair alarm set   Nurse Communication Mobility status;Precautions;Need for lift equipment        Time: 9562-1308 OT Time Calculation (min): 23 min  Charges: OT General Charges $OT Visit: 1 Visit OT Treatments $Self Care/Home Management : 8-22 mins  Pollyann Glen E. Benjamin Casanas, OTR/L Acute Rehabilitation Services [phone]   Cherlyn Cushing 05/01/2023, 1:11 PM

## 2023-05-01 NOTE — Progress Notes (Signed)
Physical Therapy Treatment Patient Details Name: Louis Juarez MRN: [id number] DOB: [birth year] Today's Date: 05/01/2023   History of Present Illness 82 y.o. male presents to [facility] hospital on 04/24/2023 after fall from ladder. Pt found to have open book pelvic fx, open R olceranon fx, R rib 6-12 fx, R T10 TP fx, and age-indeterminate mild compression fxs of T4, T7 and T12. Pt underwent percutaneous fixation of pelvic fxs and open treatment of R olecranon fx on 10/4. PMH includes heart failure, ICD.    PT Comments  Patient resting in bed and agreeable to mobilize with therapy and reports pain more management today. With cuing pt initiated reaching for bed rail and walking LE off EOB. Pt required Mod +2 assist to fully roll and pivot hips with bed pad, pt attempting to press up from sidelying to sit, mod assist to complete rise. Initially pr required min assist to stabilize seated balance due to anterior/Lt lean but improved with time and cues for posture. Educated on lateral transfers for bed>chair today and Min+2 assist with bed pad to assist with hip clearance to lift and scoot Rt to recliner. Pt then completed sit<>stand with bil UE support on bed rail and min assist to rise to full stand and steady balance. VSS throughout. EOS pt repositioned with pillows for comfort. Will continue to progress pt as able and recommend intense follow up of >3 hours/day for therapy.    If plan is discharge home, recommend the following: Two people to help with walking and/or transfers;Two people to help with bathing/dressing/bathroom;Assistance with cooking/housework;Assist for transportation;Help with stairs or ramp for entrance   Can travel by private vehicle        Equipment Recommendations  Wheelchair (measurements PT);Wheelchair cushion (measurements PT);Hospital bed;Hoyer lift    Recommendations for Other Services Rehab consult     Precautions / Restrictions Precautions Precautions:  Fall Restrictions Weight Bearing Restrictions: Yes RUE Weight Bearing: Weight bearing as tolerated RLE Weight Bearing: Weight bearing as tolerated LLE Weight Bearing: Weight bearing as tolerated Other Position/Activity Restrictions: bil LE WBAT for transfers only     Mobility  Bed Mobility Overal bed mobility: Needs Assistance Bed Mobility: Rolling, Sidelying to Sit Rolling: Mod assist, +2 for safety/equipment, Used rails Sidelying to sit: Mod assist, +2 for safety/equipment, HOB elevated, Used rails       General bed mobility comments: cues to initiate reaching for bed rail and walking bil LE's off EOB, mod assist with bed pad to complete pivot. pt attempting to initiate press up through Rt UE to sit.    Transfers Overall transfer level: Needs assistance Equipment used:  (bed rails) Transfers: Sit to/from Stand, Bed to chair/wheelchair/BSC Sit to Stand: Min assist (with bed rails for UE support from recliner)          Lateral/Scoot Transfers: Min assist, +2 safety/equipment General transfer comment: Cues for sequencing lateral scoot bed>chair, EOB slightly elevated compared to recliner height. Min assist with bed pad for lift and to prevent excessive anterior lean. recliner positioned in front of sidd of bed, pt using bil UE on bed rail to rise to stand, min assist for power up and steady.    Ambulation/Gait                   Stairs             Wheelchair Mobility     Tilt Bed    Modified Rankin (Stroke Patients Only)       Balance  Overall balance assessment: Needs assistance Sitting-balance support: Feet supported, Bilateral upper extremity supported Sitting balance-Leahy Scale: Fair Sitting balance - Comments: anterior and L lateral lean as pt reports significant pain with weight shift onto R pelvis   Standing balance support: Bilateral upper extremity supported, Reliant on assistive device for balance Standing balance-Leahy Scale: Poor                               Cognition Arousal: Alert Behavior During Therapy: WFL for tasks assessed/performed Overall Cognitive Status: Impaired/Different from baseline                                          Exercises      General Comments General comments (skin integrity, edema, etc.): significant bruising around side of back and abdomen/hip.      Pertinent Vitals/Pain      Home Living                          Prior Function            PT Goals (current goals can now be found in the care plan section) Acute Rehab PT Goals Patient Stated Goal: to reduce pain, return to independence PT Goal Formulation: With patient Time For Goal Achievement: 05/09/23 Potential to Achieve Goals: Fair Progress towards PT goals: Progressing toward goals    Frequency    Min 1X/week      PT Plan      Co-evaluation PT/OT/SLP Co-Evaluation/Treatment: Yes Reason for Co-Treatment: For patient/therapist safety PT goals addressed during session: Mobility/safety with mobility;Balance        AM-PAC PT "6 Clicks" Mobility   Outcome Measure  Help needed turning from your back to your side while in a flat bed without using bedrails?: A Lot Help needed moving from lying on your back to sitting on the side of a flat bed without using bedrails?: A Lot Help needed moving to and from a bed to a chair (including a wheelchair)?: A Lot Help needed standing up from a chair using your arms (e.g., wheelchair or bedside chair)?: A Little Help needed to walk in hospital room?: Total Help needed climbing 3-5 steps with a railing? : Total 6 Click Score: 11    End of Session Equipment Utilized During Treatment: Gait belt;Oxygen Activity Tolerance: Patient tolerated treatment well Patient left: with chair alarm set;with call bell/phone within reach;in chair Nurse Communication: Mobility status;Need for lift equipment PT Visit Diagnosis: Other abnormalities of  gait and mobility (R26.89);Muscle weakness (generalized) (M62.81);Pain Pain - Right/Left: Right Pain - part of body: Hip     Time: 8469-6295 PT Time Calculation (min) (ACUTE ONLY): 28 min  Charges:    $Therapeutic Activity: 8-22 mins PT General Charges $$ ACUTE PT VISIT: 1 Visit                     Wynn Maudlin, DPT Acute Rehabilitation Services Office [phone]  05/01/23 11:55 AM

## 2023-05-01 NOTE — Progress Notes (Signed)
Spouse at bedside - requesting PRN medications to be administered nightly for sleep and prior to any PT/OT.

## 2023-05-01 NOTE — Progress Notes (Signed)
Inpatient Rehabilitation Admissions Coordinator   I await insurance approval for possible [facility] admit next week. I left his wife a voicemail to give her update on the rehab plan.  Ottie Glazier, RN, MSN Rehab Admissions Coordinator [phone] 05/01/2023 11:49 AM

## 2023-05-02 ENCOUNTER — Inpatient Hospital Stay (HOSPITAL_COMMUNITY): Payer: BC Managed Care – PPO

## 2023-05-02 LAB — MAGNESIUM: Magnesium: 2.8 mg/dL — ABNORMAL HIGH (ref 1.7–2.4)

## 2023-05-02 LAB — RENAL FUNCTION PANEL
Albumin: 2.2 g/dL — ABNORMAL LOW (ref 3.5–5.0)
Anion gap: 8 (ref 5–15)
BUN: 42 mg/dL — ABNORMAL HIGH (ref 8–23)
CO2: 29 mmol/L (ref 22–32)
Calcium: 8.1 mg/dL — ABNORMAL LOW (ref 8.9–10.3)
Chloride: 94 mmol/L — ABNORMAL LOW (ref 98–111)
Creatinine, Ser: 1.15 mg/dL (ref 0.61–1.24)
GFR, Estimated: >60 mL/min (ref >60)
Glucose, Bld: 154 mg/dL — ABNORMAL HIGH (ref 70–99)
Phosphorus: 3.4 mg/dL (ref 2.5–4.6)
Potassium: 5.1 mmol/L (ref 3.5–5.1)
Sodium: 131 mmol/L — ABNORMAL LOW (ref 135–145)

## 2023-05-02 LAB — CBC
HCT: 25.7 % — ABNORMAL LOW (ref 39.0–52.0)
Hemoglobin: 8.3 g/dL — ABNORMAL LOW (ref 13.0–17.0)
MCH: 30.9 pg (ref 26.0–34.0)
MCHC: 32.3 g/dL (ref 30.0–36.0)
MCV: 95.5 fL (ref 80.0–100.0)
Platelets: 312 10*3/uL (ref 150–400)
RBC: 2.69 MIL/uL — ABNORMAL LOW (ref 4.22–5.81)
RDW: 15.5 % (ref 11.5–15.5)
WBC: 22 10*3/uL — ABNORMAL HIGH (ref 4.0–10.5)
nRBC: 0.3 % — ABNORMAL HIGH (ref 0.0–0.2)

## 2023-05-02 LAB — GLUCOSE, CAPILLARY: Glucose-Capillary: 114 mg/dL — ABNORMAL HIGH (ref 70–99)

## 2023-05-02 LAB — URINALYSIS, ROUTINE W REFLEX MICROSCOPIC
Bacteria, UA: NONE SEEN
Bilirubin Urine: NEGATIVE
Glucose, UA: NEGATIVE mg/dL
Ketones, ur: NEGATIVE mg/dL
Leukocytes,Ua: NEGATIVE
Nitrite: NEGATIVE
Protein, ur: NEGATIVE mg/dL
RBC / HPF: >50 RBC/hpf (ref 0–5)
Specific Gravity, Urine: >1.046 — ABNORMAL HIGH (ref 1.005–1.030)
pH: 5 (ref 5.0–8.0)

## 2023-05-02 MED ORDER — OXYCODONE HCL 5 MG PO TABS
10.0000 mg | ORAL_TABLET | ORAL | Status: DC | PRN
  Administered 2023-05-02 – 2023-05-05 (×3): 10 mg via ORAL
  Filled 2023-05-02 (×3): qty 2

## 2023-05-02 MED ORDER — IOHEXOL 350 MG/ML SOLN
75.0000 mL | Freq: Once | INTRAVENOUS | Status: AC | PRN
  Administered 2023-05-02: 75 mL via INTRAVENOUS

## 2023-05-02 MED ORDER — OXYCODONE HCL 5 MG PO TABS
5.0000 mg | ORAL_TABLET | ORAL | Status: DC | PRN
  Administered 2023-05-04 – 2023-05-13 (×21): 5 mg via ORAL
  Filled 2023-05-02 (×24): qty 1

## 2023-05-02 NOTE — Plan of Care (Signed)
  Problem: Education: Goal: Knowledge of General Education information will improve Description: Including pain rating scale, medication(s)/side effects and non-pharmacologic comfort measures Outcome: Progressing   Problem: Clinical Measurements: Goal: Ability to maintain clinical measurements within normal limits will improve Outcome: Progressing Goal: Will remain free from infection Outcome: Progressing Goal: Diagnostic test results will improve Outcome: Progressing Goal: Respiratory complications will improve Outcome: Progressing Goal: Cardiovascular complication will be avoided Outcome: Progressing   Problem: Activity: Goal: Risk for activity intolerance will decrease Outcome: Not Progressing   Problem: Nutrition: Goal: Adequate nutrition will be maintained Outcome: Progressing

## 2023-05-02 NOTE — Progress Notes (Signed)
I was called midday because someone noticed the pacemaker was firing within the QRS complex on lead II. He is asymptomatic. I spoke with cardiology who will review his tele strip.

## 2023-05-02 NOTE — Progress Notes (Addendum)
8 Days Post-Op   Subjective/Chief Complaint: Patient reports worsening back and flank pain bilaterally Foley replaced, hematuria present   Objective: Vital signs in last 24 hours: Temp:  [97.6 F (36.4 C)-99.2 F (37.3 C)] 98.3 F (36.8 C) (10/12 0732) Pulse Rate:  [73-88] 84 (10/12 0900) Resp:  [19-26] 25 (10/12 0900) BP: (101-143)/(57-96) 143/73 (10/12 0732) SpO2:  [92 %-98 %] 92 % (10/12 0900) Weight:  [92.4 kg] 92.4 kg (10/12 0500) Last BM Date : 05/01/23  Intake/Output from previous day: 10/11 0701 - 10/12 0700 In: 1057 [P.O.:1057] Out: 1325 [Urine:1325] Intake/Output this shift: Total I/O In: -  Out: 110 [Urine:110]  Exam: Awake and alert Appears uncomfortable Abdomen soft, obese, hematuria in foley  Lab Results:  Recent Labs    05/01/23 1119 05/02/23 0606  WBC 20.4* 22.0*  HGB 8.7* 8.3*  HCT 26.5* 25.7*  PLT 285 312   BMET Recent Labs    05/01/23 1119 05/02/23 0606  NA 133* 131*  K 4.9 5.1  CL 94* 94*  CO2 30 29  GLUCOSE 162* 154*  BUN 42* 42*  CREATININE 1.06 1.15  CALCIUM 8.2* 8.1*   PT/INR No results for input(s): "LABPROT", "INR" in the last 72 hours. ABG No results for input(s): "PHART", "HCO3" in the last 72 hours.  Invalid input(s): "PCO2", "PO2"  Studies/Results: DG CHEST PORT 1 VIEW  Result Date: 05/01/2023 CLINICAL DATA:  Pneumonia. EXAM: PORTABLE CHEST 1 VIEW COMPARISON:  April 26, 2023. FINDINGS: Stable cardiomediastinal silhouette. Left-sided defibrillator is unchanged. Minimal bibasilar subsegmental atelectasis is noted. Bony thorax is unremarkable. IMPRESSION: Minimal bibasilar subsegmental atelectasis. Electronically Signed   By: Lupita Raider M.D.   On: 05/01/2023 13:16    Anti-infectives: Anti-infectives (From admission, onward)    Start     Dose/Rate Route Frequency Ordered Stop   04/24/23 2300  ceFAZolin (ANCEF) IVPB 2g/100 mL premix        2 g 200 mL/hr over 30 Minutes Intravenous Every 8 hours 04/24/23 1900  04/25/23 1557   04/24/23 1714  vancomycin (VANCOCIN) powder  Status:  Discontinued          As needed 04/24/23 1715 04/24/23 1749   04/24/23 1300  metroNIDAZOLE (FLAGYL) IVPB 500 mg        500 mg 100 mL/hr over 60 Minutes Intravenous  Once 04/24/23 1257 04/24/23 1459   04/24/23 1215  ceFAZolin (ANCEF) IVPB 2g/100 mL premix        2 g 200 mL/hr over 30 Minutes Intravenous  Once 04/24/23 1202 04/24/23 1258       Assessment/Plan: Fall from ladder, 20 ft   Hemmorrhagic shock - resolved Pelvic fractures (R iliac bone, R pubic rami, and right and left sacrum with 10cm diastasis of pubic symphysis) -  Foley and binder placed in trauma bay. OR ortho 10/4 perc fixation of numerous pelvic fxs, ORIF right elbow. BLE - bed to chair transfers per ortho. Open R elbow/olecranon fx - Per Ortho --> I&D 10/4  R 6-12th rib fxs - multimodal pain control, pulm toilet,  CXR yesterday stable R T10 TP fxs - multimodal pain control.  Age-indeterminate mild compression fractures of T4, T7 and T12 - discussed with NSGY Dr. Jordan Likes who reviewed images. Does not need OR or brace/spine precautions. Incidental findings - Pulmonary nodules and Severe atherosclerotic calcifications of the left carotid bulb and proximal left cervical ICA. 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 FEN: Heart healthy diet, SLIV. +BM  ID: Tdap in ED, Ancef in ED for open fracture.               VTE: SCDs, LMWH Foley - replaced Dispo: 4NP, melatonin/seroquel HS for sleep  - given hematuria, worsening flank and back pain, increasing WBC, will order a CT scan of the abdomen and pelvis to r/o post op issues.  Moderately complex medical decision making  Abigail Miyamoto MD 05/02/2023

## 2023-05-03 LAB — RENAL FUNCTION PANEL
Albumin: 2.1 g/dL — ABNORMAL LOW (ref 3.5–5.0)
Anion gap: 10 (ref 5–15)
BUN: 38 mg/dL — ABNORMAL HIGH (ref 8–23)
CO2: 28 mmol/L (ref 22–32)
Calcium: 8.2 mg/dL — ABNORMAL LOW (ref 8.9–10.3)
Chloride: 95 mmol/L — ABNORMAL LOW (ref 98–111)
Creatinine, Ser: 1.21 mg/dL (ref 0.61–1.24)
GFR, Estimated: 60 mL/min — ABNORMAL LOW (ref >60)
Glucose, Bld: 191 mg/dL — ABNORMAL HIGH (ref 70–99)
Phosphorus: 3.7 mg/dL (ref 2.5–4.6)
Potassium: 4.6 mmol/L (ref 3.5–5.1)
Sodium: 133 mmol/L — ABNORMAL LOW (ref 135–145)

## 2023-05-03 LAB — MAGNESIUM: Magnesium: 2.5 mg/dL — ABNORMAL HIGH (ref 1.7–2.4)

## 2023-05-03 LAB — CBC
HCT: 25.3 % — ABNORMAL LOW (ref 39.0–52.0)
Hemoglobin: 8.2 g/dL — ABNORMAL LOW (ref 13.0–17.0)
MCH: 30.7 pg (ref 26.0–34.0)
MCHC: 32.4 g/dL (ref 30.0–36.0)
MCV: 94.8 fL (ref 80.0–100.0)
Platelets: 387 10*3/uL (ref 150–400)
RBC: 2.67 MIL/uL — ABNORMAL LOW (ref 4.22–5.81)
RDW: 15.6 % — ABNORMAL HIGH (ref 11.5–15.5)
WBC: 20.5 10*3/uL — ABNORMAL HIGH (ref 4.0–10.5)
nRBC: 0.1 % (ref 0.0–0.2)

## 2023-05-03 MED ORDER — PIPERACILLIN-TAZOBACTAM 3.375 G IVPB
3.3750 g | Freq: Three times a day (TID) | INTRAVENOUS | Status: AC
  Administered 2023-05-03 – 2023-05-11 (×26): 3.375 g via INTRAVENOUS
  Filled 2023-05-03 (×26): qty 50

## 2023-05-03 NOTE — Plan of Care (Signed)
  Problem: Clinical Measurements: Goal: Cardiovascular complication will be avoided 05/03/2023 1122 by Dannielle Burn, RN Outcome: Progressing 05/03/2023 1122 by Dannielle Burn, RN Outcome: Progressing   Problem: Education: Goal: Knowledge of General Education information will improve Description: Including pain rating scale, medication(s)/side effects and non-pharmacologic comfort measures 05/03/2023 1122 by Dannielle Burn, RN Outcome: Progressing 05/03/2023 1122 by Dannielle Burn, RN Outcome: Progressing   Problem: Health Behavior/Discharge Planning: Goal: Ability to manage health-related needs will improve 05/03/2023 1122 by Dannielle Burn, RN Outcome: Progressing 05/03/2023 1122 by Dannielle Burn, RN Outcome: Progressing   Problem: Clinical Measurements: Goal: Ability to maintain clinical measurements within normal limits will improve 05/03/2023 1122 by Dannielle Burn, RN Outcome: Progressing 05/03/2023 1122 by Dannielle Burn, RN Outcome: Progressing Goal: Will remain free from infection 05/03/2023 1122 by Dannielle Burn, RN Outcome: Progressing 05/03/2023 1122 by Dannielle Burn, RN Outcome: Progressing Goal: Diagnostic test results will improve 05/03/2023 1122 by Dannielle Burn, RN Outcome: Progressing 05/03/2023 1122 by Dannielle Burn, RN Outcome: Progressing Goal: Respiratory complications will improve 05/03/2023 1122 by Dannielle Burn, RN Outcome: Progressing 05/03/2023 1122 by Dannielle Burn, RN Outcome: Progressing Goal: Cardiovascular complication will be avoided 05/03/2023 1122 by Dannielle Burn, RN Outcome: Progressing 05/03/2023 1122 by Dannielle Burn, RN Outcome: Progressing   Problem: Activity: Goal: Risk for activity intolerance will decrease 05/03/2023 1122 by Dannielle Burn, RN Outcome: Progressing 05/03/2023 1122 by Dannielle Burn, RN Outcome: Progressing   Problem: Nutrition: Goal: Adequate nutrition  will be maintained 05/03/2023 1122 by Dannielle Burn, RN Outcome: Progressing 05/03/2023 1122 by Dannielle Burn, RN Outcome: Progressing   Problem: Coping: Goal: Level of anxiety will decrease 05/03/2023 1122 by Dannielle Burn, RN Outcome: Progressing 05/03/2023 1122 by Dannielle Burn, RN Outcome: Progressing   Problem: Elimination: Goal: Will not experience complications related to bowel motility 05/03/2023 1122 by Dannielle Burn, RN Outcome: Progressing 05/03/2023 1122 by Dannielle Burn, RN Outcome: Progressing Goal: Will not experience complications related to urinary retention 05/03/2023 1122 by Dannielle Burn, RN Outcome: Progressing 05/03/2023 1122 by Dannielle Burn, RN Outcome: Progressing   Problem: Pain Managment: Goal: General experience of comfort will improve 05/03/2023 1122 by Dannielle Burn, RN Outcome: Progressing 05/03/2023 1122 by Dannielle Burn, RN Outcome: Progressing   Problem: Safety: Goal: Ability to remain free from injury will improve 05/03/2023 1122 by Dannielle Burn, RN Outcome: Progressing 05/03/2023 1122 by Dannielle Burn, RN Outcome: Progressing   Problem: Skin Integrity: Goal: Risk for impaired skin integrity will decrease 05/03/2023 1122 by Dannielle Burn, RN Outcome: Progressing 05/03/2023 1122 by Dannielle Burn, RN Outcome: Progressing

## 2023-05-03 NOTE — Progress Notes (Signed)
9 Days Post-Op   Subjective/Chief Complaint: Pt reports less flank/back pain today 02 sats remain low   Objective: Vital signs in last 24 hours: Temp:  [97.7 F (36.5 C)-99 F (37.2 C)] 98 F (36.7 C) (10/13 0700) Pulse Rate:  [76-87] 87 (10/13 0700) Resp:  [19-25] 20 (10/13 0411) BP: (137-158)/(52-67) 154/67 (10/13 0700) SpO2:  [91 %-98 %] 91 % (10/13 0700) Weight:  [92.2 kg] 92.2 kg (10/13 0500) Last BM Date : 05/01/23  Intake/Output from previous day: 10/12 0701 - 10/13 0700 In: 720 [P.O.:720] Out: 1415 [Urine:1415] Intake/Output this shift: No intake/output data recorded.  Exam: Looks for comfortable Follow commands Resp slightly labored Abdomen soft, non-tender  Lab Results:  Recent Labs    05/02/23 0606 05/03/23 0645  WBC 22.0* 20.5*  HGB 8.3* 8.2*  HCT 25.7* 25.3*  PLT 312 387   BMET Recent Labs    05/02/23 0606 05/03/23 0645  NA 131* 133*  K 5.1 4.6  CL 94* 95*  CO2 29 28  GLUCOSE 154* 191*  BUN 42* 38*  CREATININE 1.15 1.21  CALCIUM 8.1* 8.2*   PT/INR No results for input(s): "LABPROT", "INR" in the last 72 hours. ABG No results for input(s): "PHART", "HCO3" in the last 72 hours.  Invalid input(s): "PCO2", "PO2"  Studies/Results: CT ABDOMEN PELVIS W CONTRAST  Result Date: 05/02/2023 CLINICAL DATA:  Abdominal pain.  Increasing white count.  Hematuria. EXAM: CT ABDOMEN AND PELVIS WITH CONTRAST TECHNIQUE: Multidetector CT imaging of the abdomen and pelvis was performed using the standard protocol following bolus administration of intravenous contrast. RADIATION DOSE REDUCTION: This exam was performed according to the departmental dose-optimization program which includes automated exposure control, adjustment of the mA and/or kV according to patient size and/or use of iterative reconstruction technique. CONTRAST:  75mL OMNIPAQUE IOHEXOL 350 MG/ML SOLN COMPARISON:  CT of the abdomen and pelvis 04/25/2023 FINDINGS: Lower chest: A moderate right  pleural effusion has increased. Areas of ground-glass attenuation are present in the lower lobes bilaterally. More patchy consolidation is present at the right base. The airways are patent. The heart size is normal. Pacing wires are in place. Hepatobiliary: A hemangioma of the left lobe of the liver measures 2.7 cm on image 25 of series 3. No other focal hepatic lesions are present. The common bile duct and gallbladder are normal. Pancreas: Unremarkable. No pancreatic ductal dilatation or surrounding inflammatory changes. Spleen: Normal in size without focal abnormality. Adrenals/Urinary Tract: The adrenal glands are normal bilaterally. A simple cyst near the lower pole the left kidney measures 14 mm. No other focal lesions are present. No stone or mass lesion is present. No obstruction is present. The ureters are within normal limits bilaterally. A Foley catheter is present within the urinary bladder. Diffuse bladder wall thickening is present. No discrete lesion is present. Stomach/Bowel: The stomach and duodenum are within normal limits. Small bowel is unremarkable. Terminal ileum is within normal limits. The appendix is visualized and normal. The ascending and transverse colon are within normal limits. Wall thickening inflammatory changes are present about the descending and proximal sigmoid colon. A sigmoid anastomosis is intact. Distal sigmoid and rectum are normal. Vascular/Lymphatic: Atherosclerotic calcifications are present in the aorta and branch vessels. No aneurysm is present. Reproductive: Prostate is unremarkable. Other: No abdominal wall hernia or abnormality. No abdominopelvic ascites. Subcutaneous edema is present diffusely, right greater than left. No discrete fluid collections or abscess is present. Inflammatory changes are present about fractures. Musculoskeletal: The right iliac wing fracture demonstrates  increased displacement compared to the prior exam. There is now a gap across the lag screw  entering anteriorly on the right. The comminuted left sacral ala fracture is stable. Fracture through the posterior elements of the sacrum on the right is stable. The displaced right superior pubic ramus fracture is stable. Comminuted inferior right pubic mid ramus fracture is stable. No new fractures are present. IMPRESSION: 1. Wall thickening and inflammatory changes about the descending and proximal sigmoid colon compatible with acute colitis. 2. Increased displacement of the right iliac wing fracture. There is now a gap across the lag screw entering anteriorly on the right. 3. Stable fractures of the left sacral ala, posterior elements of the right sacrum, right superior pubic ramus, and inferior right pubic mid ramus. 4. Increased moderate right pleural effusion. 5. Areas of ground-glass attenuation in the lower lobes bilaterally. More patchy consolidation is present at the right base. This may represent atelectasis or infection. 6. Subcutaneous edema is present diffusely, right greater than left. 7. 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 8. Simple cyst near the lower pole of the left kidney. 9.  Aortic Atherosclerosis (ICD10-I70.0). Electronically Signed   By: Marin Roberts M.D.   On: 05/02/2023 15:06    Anti-infectives: Anti-infectives (From admission, onward)    Start     Dose/Rate Route Frequency Ordered Stop   04/24/23 2300  ceFAZolin (ANCEF) IVPB 2g/100 mL premix        2 g 200 mL/hr over 30 Minutes Intravenous Every 8 hours 04/24/23 1900 04/25/23 1557   04/24/23 1714  vancomycin (VANCOCIN) powder  Status:  Discontinued          As needed 04/24/23 1715 04/24/23 1749   04/24/23 1300  metroNIDAZOLE (FLAGYL) IVPB 500 mg        500 mg 100 mL/hr over 60 Minutes Intravenous  Once 04/24/23 1257 04/24/23 1459   04/24/23 1215  ceFAZolin (ANCEF) IVPB 2g/100 mL premix        2 g 200 mL/hr over 30 Minutes Intravenous  Once 04/24/23 1202 04/24/23 1258       Assessment/Plan: Fall  from ladder, 20 ft   Hemmorrhagic shock - resolved Pelvic fractures (R iliac bone, R pubic rami, and right and left sacrum with 10cm diastasis of pubic symphysis) -  Foley and binder placed in trauma bay. OR ortho 10/4 perc fixation of numerous pelvic fxs, ORIF right elbow. BLE - bed to chair transfers per ortho. Open R elbow/olecranon fx - Per Ortho --> I&D 10/4  R 6-12th rib fxs - multimodal pain control, pulm toilet,  CXR yesterday stable R T10 TP fxs - multimodal pain control.  Age-indeterminate mild compression fractures of T4, T7 and T12 - discussed with NSGY Dr. Jordan Likes who reviewed images. Does not need OR or brace/spine precautions. Incidental findings - Pulmonary nodules and Severe atherosclerotic calcifications of the left carotid bulb and proximal left cervical ICA. 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 FEN: Heart healthy diet, SLIV. +BM  ID: Tdap in ED, Ancef in ED for open fracture.               VTE: SCDs, LMWH Foley - replaced Dispo: 4NP, melatonin/seroquel HS for sleep  -CT scan reviewed.  There is some increase in the pleural effusion and possible infiltrates.  Given his elevated WBC and CT findings in the chest as  well as possible colitis, will start IV antibiotics -pulmonary toilet -continue PT -Ortho may need to address CT finding on the pelvis fracture -continue foley given hematuria, increased creatinine  Moderately complex medical decision making  Abigail Miyamoto MD 05/03/2023

## 2023-05-03 NOTE — Progress Notes (Signed)
Pharmacy Antibiotic Note  Louis Juarez is a 82 y.o. male admitted on 04/24/2023 after fall from ladder. S/p pelvic and multiple rib fractures. ORIF 10/4. CTAP w/ acute colitis, ground-glass attenuation possibly atelectasis or infection. Pharmacy has been consulted for zosyn dosing.   WBC up 20's. Afebrile (on sched APAP)  Plan: Zsyn 3.375g IV q8 hours  Height: 6\' 2"  (188 cm) Weight: 92.2 kg (203 lb 4.2 oz) IBW/kg (Calculated) : 82.2  Temp (24hrs), Avg:98.4 F (36.9 C), Min:97.7 F (36.5 C), Max:99 F (37.2 C)  Recent Labs  Lab 04/29/23 0624 04/30/23 0755 05/01/23 1119 05/02/23 0606 05/03/23 0645  WBC 12.6* 16.0* 20.4* 22.0* 20.5*  CREATININE 1.05 0.91 1.06 1.15 1.21    Estimated Creatinine Clearance: 54.7 mL/min (by C-G formula based on SCr of 1.21 mg/dL).    No Known Allergies  Antimicrobials this admission: Zosyn 10/13 >>   Microbiology results: 10/4 MRSA PCR: neg  Thank you for allowing pharmacy to be a part of this patient's care.  Rexford Maus, PharmD, BCPS 05/03/2023 10:11 AM

## 2023-05-03 NOTE — Plan of Care (Signed)
  Problem: Education: Goal: Knowledge of General Education information will improve Description Including pain rating scale, medication(s)/side effects and non-pharmacologic comfort measures Outcome: Progressing   Problem: Health Behavior/Discharge Planning: Goal: Ability to manage health-related needs will improve Outcome: Progressing   

## 2023-05-04 ENCOUNTER — Inpatient Hospital Stay (HOSPITAL_COMMUNITY): Payer: BC Managed Care – PPO

## 2023-05-04 ENCOUNTER — Encounter (HOSPITAL_COMMUNITY): Payer: Self-pay | Admitting: Student

## 2023-05-04 LAB — RENAL FUNCTION PANEL
Albumin: 2.2 g/dL — ABNORMAL LOW (ref 3.5–5.0)
Anion gap: 10 (ref 5–15)
BUN: 32 mg/dL — ABNORMAL HIGH (ref 8–23)
CO2: 23 mmol/L (ref 22–32)
Calcium: 8.2 mg/dL — ABNORMAL LOW (ref 8.9–10.3)
Chloride: 99 mmol/L (ref 98–111)
Creatinine, Ser: 0.97 mg/dL (ref 0.61–1.24)
GFR, Estimated: >60 mL/min (ref >60)
Glucose, Bld: 137 mg/dL — ABNORMAL HIGH (ref 70–99)
Phosphorus: 4 mg/dL (ref 2.5–4.6)
Potassium: 5.2 mmol/L — ABNORMAL HIGH (ref 3.5–5.1)
Sodium: 132 mmol/L — ABNORMAL LOW (ref 135–145)

## 2023-05-04 LAB — MAGNESIUM: Magnesium: 2.4 mg/dL (ref 1.7–2.4)

## 2023-05-04 LAB — URINALYSIS, ROUTINE W REFLEX MICROSCOPIC
Bilirubin Urine: NEGATIVE
Glucose, UA: NEGATIVE mg/dL
Ketones, ur: NEGATIVE mg/dL
Leukocytes,Ua: NEGATIVE
Nitrite: NEGATIVE
Protein, ur: NEGATIVE mg/dL
RBC / HPF: >50 RBC/hpf (ref 0–5)
Specific Gravity, Urine: 1.006 (ref 1.005–1.030)
pH: 8 (ref 5.0–8.0)

## 2023-05-04 LAB — CBC
HCT: 28.5 % — ABNORMAL LOW (ref 39.0–52.0)
Hemoglobin: 8.9 g/dL — ABNORMAL LOW (ref 13.0–17.0)
MCH: 30.1 pg (ref 26.0–34.0)
MCHC: 31.2 g/dL (ref 30.0–36.0)
MCV: 96.3 fL (ref 80.0–100.0)
Platelets: 482 10*3/uL — ABNORMAL HIGH (ref 150–400)
RBC: 2.96 MIL/uL — ABNORMAL LOW (ref 4.22–5.81)
RDW: 15.8 % — ABNORMAL HIGH (ref 11.5–15.5)
WBC: 21.7 10*3/uL — ABNORMAL HIGH (ref 4.0–10.5)
nRBC: 0.2 % (ref 0.0–0.2)

## 2023-05-04 MED ORDER — IPRATROPIUM-ALBUTEROL 0.5-2.5 (3) MG/3ML IN SOLN
3.0000 mL | Freq: Four times a day (QID) | RESPIRATORY_TRACT | Status: AC
  Administered 2023-05-04 (×2): 3 mL via RESPIRATORY_TRACT
  Filled 2023-05-04 (×2): qty 3

## 2023-05-04 MED ORDER — FUROSEMIDE 10 MG/ML IJ SOLN
40.0000 mg | Freq: Once | INTRAMUSCULAR | Status: AC
  Administered 2023-05-04: 40 mg via INTRAVENOUS
  Filled 2023-05-04: qty 4

## 2023-05-04 MED ORDER — SODIUM ZIRCONIUM CYCLOSILICATE 10 G PO PACK
10.0000 g | PACK | Freq: Once | ORAL | Status: AC
  Administered 2023-05-04: 10 g via ORAL
  Filled 2023-05-04: qty 1

## 2023-05-04 NOTE — Progress Notes (Signed)
OT Cancellation Note  Patient Details Name: HEARL HEIKES MRN: [id number] DOB: 04-Jan-1941   Cancelled Treatment:    Reason Eval/Treat Not Completed: Patient not medically ready (Patient awaiting surgeon to assess patient before attempting OOB) Alfonse Flavors, OTA Acute Rehabilitation Services  Office [phone]  Dewain Penning 05/04/2023, 10:17 AM

## 2023-05-04 NOTE — Progress Notes (Signed)
Ortho Trauma Note  I was called to the to see and evaluate the patient regarding drainage from his right elbow as well as CT scan findings about potential further distraction of his right iliac fracture.  I seen the patient and evaluated him is having continued pain in his low back.  States that it is better today.  He has been doing weightbearing for transfers to bed to chair.  He also started to have the drainage from his right elbow with increased pain.  On exam he does have serosanguineous drainage from his elbow.  His pelvis is without any significant leg length deformity.  His percutaneous incisions are healed and clean dry and intact.  Imaging: CT scan has been reviewed.  It appears the transsacral transiliac screw in the iliac wing has shifted in the right side and there has been a little bit more distraction through the inferior SI joint.  Plan: Pelvic fracture: Patient has some loss of fixation and a transsacral transiliac fracture in the posterior aspect on the right side.  His left side appears to be intact and he still has screw fixation going from the AIIS to the PSIS.  His superior pubic rami fixation is stable as well.  I will obtain x-rays today to assess if there is significant rotation or deformity to his pelvis.  I will plan to make him strictly nonweightbearing on the right side and weightbearing for transfers on the left side as tolerated.  I will reassess him later in the week after mobilization with therapy to likely obtain new x-rays and to determine whether a revision fixation would be appropriate.  My concern is that his posterior fixation is limited in options.  His bone quality is poor and so would be at high risk for displacement and failure. In regards to his right elbow I did remove sutures today and expressed fluid and removed some fibrinous material from the wound.  I dressed it with 4 x 4's and Kerlix.  Plan for continuation with antibiotics.  Will reassess on Wednesday to  determine whether a formal I&D will be necessary.  I am hopeful that it will clear up with the decompression and antibiotics.  Roby Lofts, MD [facility] [phone] (office) [email or web address]

## 2023-05-04 NOTE — Progress Notes (Addendum)
Inpatient Rehab Admissions Coordinator:   We do not have insurance approval yet for CIR, nor do we have bed available today. Will continue to follow for potential CIR admission later this week. Patient also having continued medical workup. I met with patient at bedside to let him know.   Rehab Admissons Coordinator [place], [place], [place] [phone]

## 2023-05-04 NOTE — Plan of Care (Signed)

## 2023-05-04 NOTE — Progress Notes (Signed)
10 Days Post-Op   Subjective/Chief Complaint: States he overall feels better. Denies abdominal pain. Denies SOB. States he is tolerating PO. Last BM was saturday.   Objective: Vital signs in last 24 hours: Temp:  [97.6 F (36.4 C)-99.2 F (37.3 C)] 97.6 F (36.4 C) (10/14 0808) Pulse Rate:  [77-91] 78 (10/14 0808) Resp:  [16-29] 28 (10/14 0808) BP: (144-155)/(57-70) 155/57 (10/14 0808) SpO2:  [90 %-97 %] 94 % (10/14 0808) Weight:  [81.3 kg] 81.3 kg (10/14 0249) Last BM Date : 05/03/23  Intake/Output from previous day: 10/13 0701 - 10/14 0700 In: 890 [P.O.:840; IV Piggyback:50] Out: 1500 [Urine:1500] Intake/Output this shift: Total I/O In: 10 [I.V.:10] Out: 1000 [Urine:1000]  Exam: Looks comfortable Follow commands RR slightly increased (24-26 expiratory wheezes present R>L, no crackles appreciated  Abdomen soft, non-tender  Lab Results:  Recent Labs    05/03/23 0645 05/04/23 0648  WBC 20.5* 21.7*  HGB 8.2* 8.9*  HCT 25.3* 28.5*  PLT 387 482*   BMET Recent Labs    05/03/23 0645 05/04/23 0649  NA 133* 132*  K 4.6 5.2*  CL 95* 99  CO2 28 23  GLUCOSE 191* 137*  BUN 38* 32*  CREATININE 1.21 0.97  CALCIUM 8.2* 8.2*   PT/INR No results for input(s): "LABPROT", "INR" in the last 72 hours. ABG No results for input(s): "PHART", "HCO3" in the last 72 hours.  Invalid input(s): "PCO2", "PO2"  Studies/Results: DG CHEST PORT 1 VIEW  Result Date: 05/04/2023 CLINICAL DATA:  Right-sided pleural effusion. EXAM: PORTABLE CHEST 1 VIEW COMPARISON:  Radiograph 05/01/2023. CT 04/24/2023, lung bases from abdominopelvic CT 05/02/2023 FINDINGS: Left-sided pacemaker in place. Hazy opacity at the right lung base likely corresponds to effusion on recent abdominopelvic CT. Slight bronchovascular crowding without frank pulmonary edema. The heart is normal in size. No pneumothorax. Known rib fractures are not well demonstrated. IMPRESSION: Hazy opacity at the right lung base  likely corresponds to effusion on recent abdominopelvic CT. Electronically Signed   By: Narda Rutherford M.D.   On: 05/04/2023 09:32   CT ABDOMEN PELVIS W CONTRAST  Result Date: 05/02/2023 CLINICAL DATA:  Abdominal pain.  Increasing white count.  Hematuria. EXAM: CT ABDOMEN AND PELVIS WITH CONTRAST TECHNIQUE: Multidetector CT imaging of the abdomen and pelvis was performed using the standard protocol following bolus administration of intravenous contrast. RADIATION DOSE REDUCTION: This exam was performed according to the departmental dose-optimization program which includes automated exposure control, adjustment of the mA and/or kV according to patient size and/or use of iterative reconstruction technique. CONTRAST:  75mL OMNIPAQUE IOHEXOL 350 MG/ML SOLN COMPARISON:  CT of the abdomen and pelvis 04/25/2023 FINDINGS: Lower chest: A moderate right pleural effusion has increased. Areas of ground-glass attenuation are present in the lower lobes bilaterally. More patchy consolidation is present at the right base. The airways are patent. The heart size is normal. Pacing wires are in place. Hepatobiliary: A hemangioma of the left lobe of the liver measures 2.7 cm on image 25 of series 3. No other focal hepatic lesions are present. The common bile duct and gallbladder are normal. Pancreas: Unremarkable. No pancreatic ductal dilatation or surrounding inflammatory changes. Spleen: Normal in size without focal abnormality. Adrenals/Urinary Tract: The adrenal glands are normal bilaterally. A simple cyst near the lower pole the left kidney measures 14 mm. No other focal lesions are present. No stone or mass lesion is present. No obstruction is present. The ureters are within normal limits bilaterally. A Foley catheter is present within the urinary  bladder. Diffuse bladder wall thickening is present. No discrete lesion is present. Stomach/Bowel: The stomach and duodenum are within normal limits. Small bowel is unremarkable.  Terminal ileum is within normal limits. The appendix is visualized and normal. The ascending and transverse colon are within normal limits. Wall thickening inflammatory changes are present about the descending and proximal sigmoid colon. A sigmoid anastomosis is intact. Distal sigmoid and rectum are normal. Vascular/Lymphatic: Atherosclerotic calcifications are present in the aorta and branch vessels. No aneurysm is present. Reproductive: Prostate is unremarkable. Other: No abdominal wall hernia or abnormality. No abdominopelvic ascites. Subcutaneous edema is present diffusely, right greater than left. No discrete fluid collections or abscess is present. Inflammatory changes are present about fractures. Musculoskeletal: The right iliac wing fracture demonstrates increased displacement compared to the prior exam. There is now a gap across the lag screw entering anteriorly on the right. The comminuted left sacral ala fracture is stable. Fracture through the posterior elements of the sacrum on the right is stable. The displaced right superior pubic ramus fracture is stable. Comminuted inferior right pubic mid ramus fracture is stable. No new fractures are present. IMPRESSION: 1. Wall thickening and inflammatory changes about the descending and proximal sigmoid colon compatible with acute colitis. 2. Increased displacement of the right iliac wing fracture. There is now a gap across the lag screw entering anteriorly on the right. 3. Stable fractures of the left sacral ala, posterior elements of the right sacrum, right superior pubic ramus, and inferior right pubic mid ramus. 4. Increased moderate right pleural effusion. 5. Areas of ground-glass attenuation in the lower lobes bilaterally. More patchy consolidation is present at the right base. This may represent atelectasis or infection. 6. Subcutaneous edema is present diffusely, right greater than left. 7. 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 8. Simple cyst  near the lower pole of the left kidney. 9.  Aortic Atherosclerosis (ICD10-I70.0). Electronically Signed   By: Marin Roberts M.D.   On: 05/02/2023 15:06    Anti-infectives: Anti-infectives (From admission, onward)    Start     Dose/Rate Route Frequency Ordered Stop   05/03/23 1100  piperacillin-tazobactam (ZOSYN) IVPB 3.375 g        3.375 g 12.5 mL/hr over 240 Minutes Intravenous Every 8 hours 05/03/23 1010     04/24/23 2300  ceFAZolin (ANCEF) IVPB 2g/100 mL premix        2 g 200 mL/hr over 30 Minutes Intravenous Every 8 hours 04/24/23 1900 04/25/23 1557   04/24/23 1714  vancomycin (VANCOCIN) powder  Status:  Discontinued          As needed 04/24/23 1715 04/24/23 1749   04/24/23 1300  metroNIDAZOLE (FLAGYL) IVPB 500 mg        500 mg 100 mL/hr over 60 Minutes Intravenous  Once 04/24/23 1257 04/24/23 1459   04/24/23 1215  ceFAZolin (ANCEF) IVPB 2g/100 mL premix        2 g 200 mL/hr over 30 Minutes Intravenous  Once 04/24/23 1202 04/24/23 1258       Assessment/Plan: Fall from ladder, 20 ft   Hemmorrhagic shock - resolved Pelvic fractures (R iliac bone, R pubic rami, and right and left sacrum with 10cm diastasis of pubic symphysis) -  Foley and binder placed in trauma bay. OR ortho 10/4 perc fixation of numerous pelvic fxs, ORIF right elbow. BLE - bed to chair transfers per ortho. CT over weekend ordered due to WBC, flank pain/hematuria >> increased displacement of right iliac  wing FX. Will notify Dr. Clare Charon PA-C. Open R elbow/olecranon fx - Per Ortho --> I&D 10/4  R 6-12th rib fxs - multimodal pain control, pulm toilet,  CXR today appears stable, some haziness that looks like mild pulm edema Right pleural effusion - not present on CT from admission, will review with MD. I dont think he needs a thora right now. R T10 TP fxs - multimodal pain control.  Age-indeterminate mild compression fractures of T4, T7 and T12 - discussed with NSGY Dr. Jordan Likes who reviewed images.  Does not need OR or brace/spine precautions. Incidental findings - Pulmonary nodules and Severe atherosclerotic calcifications of the left carotid bulb and proximal left cervical ICA. 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 FEN: Heart healthy diet, SLIV. +BM  ID: Tdap in ED, Ancef in ED for open fracture; Zosyn started 10/13 due to colitis noted on CT and possible RLL pneumonia. Urine culture pending.               VTE: SCDs, LMWH Foley - replaced Dispo: 4NP, melatonin/seroquel HS for sleep   Moderately complex medical decision making  Adam Phenix MD 05/04/2023

## 2023-05-04 NOTE — Progress Notes (Signed)
PT Cancellation Note  Patient Details Name: Louis Juarez MRN: [id number] DOB: 09/04/1940   Cancelled Treatment:    Reason Eval/Treat Not Completed: Medical issues which prohibited therapy;Patient not medically ready (Possible greater displacement of right iliac fx on repeat imaging per trauma team. Will hold until ortho follows up.)   Wynn Maudlin, DPT Acute Rehabilitation Services Office [phone]  05/04/23 10:18 AM

## 2023-05-04 NOTE — Plan of Care (Signed)

## 2023-05-05 ENCOUNTER — Inpatient Hospital Stay (HOSPITAL_COMMUNITY): Payer: BC Managed Care – PPO

## 2023-05-05 LAB — RENAL FUNCTION PANEL
Albumin: 2.3 g/dL — ABNORMAL LOW (ref 3.5–5.0)
Anion gap: 11 (ref 5–15)
BUN: 25 mg/dL — ABNORMAL HIGH (ref 8–23)
CO2: 26 mmol/L (ref 22–32)
Calcium: 8.9 mg/dL (ref 8.9–10.3)
Chloride: 96 mmol/L — ABNORMAL LOW (ref 98–111)
Creatinine, Ser: 0.93 mg/dL (ref 0.61–1.24)
GFR, Estimated: >60 mL/min (ref >60)
Glucose, Bld: 139 mg/dL — ABNORMAL HIGH (ref 70–99)
Phosphorus: 3.3 mg/dL (ref 2.5–4.6)
Potassium: 4.2 mmol/L (ref 3.5–5.1)
Sodium: 133 mmol/L — ABNORMAL LOW (ref 135–145)

## 2023-05-05 LAB — MAGNESIUM: Magnesium: 2.1 mg/dL (ref 1.7–2.4)

## 2023-05-05 LAB — CBC
HCT: 28.3 % — ABNORMAL LOW (ref 39.0–52.0)
Hemoglobin: 9.1 g/dL — ABNORMAL LOW (ref 13.0–17.0)
MCH: 31.2 pg (ref 26.0–34.0)
MCHC: 32.2 g/dL (ref 30.0–36.0)
MCV: 96.9 fL (ref 80.0–100.0)
Platelets: 569 10*3/uL — ABNORMAL HIGH (ref 150–400)
RBC: 2.92 MIL/uL — ABNORMAL LOW (ref 4.22–5.81)
RDW: 15.9 % — ABNORMAL HIGH (ref 11.5–15.5)
WBC: 23.6 10*3/uL — ABNORMAL HIGH (ref 4.0–10.5)
nRBC: 0.1 % (ref 0.0–0.2)

## 2023-05-05 NOTE — Progress Notes (Signed)
Inpatient Rehabilitation Admissions Coordinator   Noted medical workup ongoing. I will have to resubmit for insurance Auth as not medically ready to admit to CIR at this time. I met with patient at bedside.  Ottie Glazier, RN, MSN Rehab Admissions Coordinator [phone] 05/05/2023 11:14 AM

## 2023-05-05 NOTE — Progress Notes (Signed)
Occupational Therapy Treatment Patient Details Name: Louis Juarez MRN: [id number] DOB: 12-14-1940 Today's Date: 05/05/2023   History of present illness 82 y.o. male presents to [facility] hospital on 04/24/2023 after fall from ladder. Pt found to have open book pelvic fx, open R olceranon fx, R rib 6-12 fx, R T10 TP fx, and age-indeterminate mild compression fxs of T4, T7 and T12. Pt underwent percutaneous fixation of pelvic fxs and open treatment of R olecranon fx on 10/4. PMH includes heart failure, ICD.   OT comments  Patient received in supine and agreeable to OT/PT session. Patient aware of NWB through RLE during transfer. Patient stating he felt his heart racing but HR was 90. Patient assisted to EOB with +2 assist and patient demonstrating SOB and instructed on pursed lip breathing. Sliding board positioned for transfer with patient attempting to stand and reminded of WB precautions. Patient was mod assist +2 for lateral scoot transfer to recliner with sliding board with cues for WB precautions. Vitals at end of session HR 98, SpO2 95% on 2 liters, and BP 148/73. Patient will benefit from intensive inpatient follow up therapy, >3 hours/day to address transfers and self care. Acute OT to continue to follow.       If plan is discharge home, recommend the following:  Two people to help with walking and/or transfers;A lot of help with bathing/dressing/bathroom;Assistance with cooking/housework;Assist for transportation;Help with stairs or ramp for entrance;Supervision due to cognitive status   Equipment Recommendations  Other (comment) (defer to next venue of care)    Recommendations for Other Services Rehab consult    Precautions / Restrictions Precautions Precautions: Fall Restrictions Weight Bearing Restrictions: Yes RUE Weight Bearing: Weight bearing as tolerated RLE Weight Bearing: Non weight bearing LLE Weight Bearing: Weight bearing as tolerated (WB for transfers only) Other  Position/Activity Restrictions: LLE WBAT for transfers only       Mobility Bed Mobility Overal bed mobility: Needs Assistance Bed Mobility: Rolling, Sidelying to Sit Rolling: Mod assist Sidelying to sit: Mod assist, +2 for safety/equipment, HOB elevated, Used rails       General bed mobility comments: cues throughout with assistance for BLEs and trunk, bed pad used to assist    Transfers Overall transfer level: Needs assistance Equipment used: Sliding board Transfers: Bed to chair/wheelchair/BSC            Lateral/Scoot Transfers: Mod assist, +2 physical assistance General transfer comment: cues for WB precautions, body mechanics, and hand placement. Bed pad used to assist with scooting     Balance Overall balance assessment: Needs assistance Sitting-balance support: Feet supported, Bilateral upper extremity supported Sitting balance-Leahy Scale: Fair Sitting balance - Comments: discomfort while seated on EOB with patient demonstrating SOB                                   ADL either performed or assessed with clinical judgement   ADL Overall ADL's : Needs assistance/impaired Eating/Feeding: Set up;Sitting   Grooming: Set up;Sitting               Lower Body Dressing: Total assistance;Bed level Lower Body Dressing Details (indicate cue type and reason): to donn socks Toilet Transfer: Moderate assistance;+2 for safety/equipment;+2 for physical assistance Toilet Transfer Details (indicate cue type and reason): lateral scoot transfer to chair (simulated) with sliding board                Extremity/Trunk Assessment  Vision       Perception     Praxis      Cognition Arousal: Alert Behavior During Therapy: WFL for tasks assessed/performed Overall Cognitive Status: Impaired/Different from baseline                         Following Commands: Follows one step commands consistently       General Comments:  able to recall RLE WB precautions, attempting to stand from EOB after being told we were performing slide board transfers and reviewing precautions        Exercises      Shoulder Instructions       General Comments At end of session BP 148/73, HR 98, SpO2 95% on 2 liters    Pertinent Vitals/ Pain       Pain Assessment Pain Assessment: Faces Faces Pain Scale: Hurts even more Pain Location: low back right hip Pain Descriptors / Indicators: Grimacing Pain Intervention(s): Limited activity within patient's tolerance, Monitored during session, Repositioned  Home Living                                          Prior Functioning/Environment              Frequency  Min 1X/week        Progress Toward Goals  OT Goals(current goals can now be found in the care plan section)  Progress towards OT goals: Progressing toward goals  Acute Rehab OT Goals Patient Stated Goal: get better OT Goal Formulation: With patient Time For Goal Achievement: 05/09/23 Potential to Achieve Goals: Good ADL Goals Pt Will Perform Grooming: Independently;sitting Pt Will Perform Lower Body Bathing: with mod assist;sit to/from stand;sitting/lateral leans Pt Will Perform Lower Body Dressing: with mod assist;sit to/from stand;sitting/lateral leans Pt Will Transfer to Toilet: with mod assist;bedside commode;stand pivot transfer Pt Will Perform Toileting - Clothing Manipulation and hygiene: with mod assist;sitting/lateral leans;sit to/from stand Additional ADL Goal #2: Patient will be able to complete bed mobility at min A level as a precursor to OOB activities.  Plan      Co-evaluation    PT/OT/SLP Co-Evaluation/Treatment: Yes Reason for Co-Treatment: For patient/therapist safety;To address functional/ADL transfers   OT goals addressed during session: Proper use of Adaptive equipment and DME;Strengthening/ROM      AM-PAC OT "6 Clicks" Daily Activity     Outcome Measure    Help from another person eating meals?: A Little Help from another person taking care of personal grooming?: A Little Help from another person toileting, which includes using toliet, bedpan, or urinal?: A Lot Help from another person bathing (including washing, rinsing, drying)?: A Lot Help from another person to put on and taking off regular upper body clothing?: A Lot Help from another person to put on and taking off regular lower body clothing?: Total 6 Click Score: 13    End of Session Equipment Utilized During Treatment: Gait belt;Oxygen;Other (comment) (slide board)  OT Visit Diagnosis: Unsteadiness on feet (R26.81);Muscle weakness (generalized) (M62.81) Pain - Right/Left: Right Pain - part of body: Hip   Activity Tolerance Patient tolerated treatment well   Patient Left in chair;with call bell/phone within reach;with chair alarm set   Nurse Communication Mobility status;Precautions;Need for lift equipment        Time: 1610-9604 OT Time Calculation (min): 26 min  Charges: OT General Charges $OT Visit:  1 Visit OT Treatments $Therapeutic Activity: 8-22 mins  Alfonse Flavors, OTA Acute Rehabilitation Services  Office [phone]   Dewain Penning 05/05/2023, 11:28 AM

## 2023-05-05 NOTE — Progress Notes (Signed)
Nutrition Follow-up    INTERVENTION:  Provide feeding assist if needed, continue to encourage intakes.  2.  Continue Ensure Enlive tid as patient taking well. Consider increasing to four times daily if no    improvement   with meal intakes.  3.  Continue MVI/Minerals daily fo micronutrient support.   NUTRITION DIAGNOSIS:   Increased nutrient needs related to post-op healing, other (see comment) (trauma) as evidenced by estimated needs.    GOAL:   Patient will meet greater than or equal to 90% of their needs    MONITOR:   PO intake, Supplement acceptance, Labs, Weight trends  REASON FOR ASSESSMENT:   Follow up  ASSESSMENT:   82 year old male who presented to the ED on 10/04 after falling from a tree approximately 20 feet. 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 Pt admitted with open book pelvic fracture (R iliac bone, R pubic rami, and right and left sacrum with 10 cm diastasis of pubic symphysis), open R elbow/olecranon fx, R 6-12 rib fxs, R T10 TP fxs, hemorrhagic shock.  10/04 - s/p percutaneous fixation R posterior pelvic fx, percutaneous fixation L sacrum, percutaneous fixation of R superior public ramus fx, open treatment of R olecranon avulsion fx, I&D of R open olecranon fx, I&D of R open elbow arthrotomy; diet advanced to Heart Healthy 10/07 - Cortrak placement (coiled in proximal stomach), trickle TF ordered to start but never started due to pt breaking his Cortrak 10/08 - Cortrak removed  Met with patient at bedside. He is predominately drinking the Ensure, not much of the meals. Today he tells that he could not eat L as he ate a full B, eggs and fruit cup, staff noted there were no eggs on tray. Discussed importance of eating well in the hospital to support healing-patient verbalized understanding.  Weight down 9.2 kg from admit weight. Edema generalized non-pitting to lower abdomen, perianal.  Medications reviewed and include dulcolax 10 mg once,  cholecalciferol 25 mcg daily, colace 100 mg twice daily, zetia 10 mg daily, Ensure Enlive tid, MVI/Minerals 1 tablet daily, PPI 20 mg daily, miralax 17 gm twice daily.  Labs reviewed.    Diet Order:   Diet Order             Diet NPO time specified  Diet effective midnight           Diet regular Room service appropriate? Yes with Assist; Fluid consistency: Thin  Diet effective now                   EDUCATION NEEDS:   Education needs have been addressed  Skin:  Skin Assessment: Skin Integrity Issues: Skin Integrity Issues:: Other (Comment) Incisions: erythema to arm, back, bilateral lower extremity; abrasions to bilateral lower extremity, left elbow-weeping, ecchymosis to arm, scrotum, back, buttock, hand  Last BM:  10/14, type 6-large  Height:   Ht Readings from Last 1 Encounters:  04/24/23 6\' 2"  (1.88 m)    Weight:   Wt Readings from Last 1 Encounters:  05/05/23 81.5 kg    Ideal Body Weight:  86.4 kg  BMI:  Body mass index is 23.07 kg/m.  Estimated Nutritional Needs:   Kcal:  2000-2200  Protein:  100-115 grams  Fluid:  >2.0 L    Alvino Chapel, RDLD Clinical Dietitian See AMION for contact information

## 2023-05-05 NOTE — Plan of Care (Signed)

## 2023-05-05 NOTE — Progress Notes (Signed)
Physical Therapy Treatment Patient Details Name: Louis Juarez MRN: [id number] DOB: 1941-05-14 Today's Date: 05/05/2023   History of Present Illness 82 y.o. male presents to [facility] hospital on 04/24/2023 after fall from ladder. Pt found to have open book pelvic fx, open R olceranon fx, R rib 6-12 fx, R T10 TP fx, and age-indeterminate mild compression fxs of T4, T7 and T12. Pt underwent percutaneous fixation of pelvic fxs and open treatment of R olecranon fx on 10/4. PMH includes heart failure, ICD.    PT Comments  Patient resting in bed and agreeable to mobilize with therapy, able to verbalize new WB restrictions on Rt LE for NWB. Pt required +2 mod assist to move supine>sit with bed pad to pivot hips. Reviewed plan for slide board transfer today and again Rt LE NWB; pt slightly impulsive with poor processing and decreased safety awareness motioning to begin stand for transfer. Repeated cues needed for safety and WB status throughout. Patient completed with Mod+2 assist and use of pad to facilitate slide on board. Pt required support at Rt LE to scoot while NWB on Rt. EOS pt repositioned in recliner with pillows for comfort. Will continue to progress pt as able during stay.    If plan is discharge home, recommend the following: Two people to help with walking and/or transfers;Two people to help with bathing/dressing/bathroom;Assistance with cooking/housework;Assist for transportation;Help with stairs or ramp for entrance   Can travel by private vehicle        Equipment Recommendations  Wheelchair (measurements PT);Wheelchair cushion (measurements PT);Hospital bed;Hoyer lift    Recommendations for Other Services Rehab consult     Precautions / Restrictions Precautions Precautions: Fall Restrictions Weight Bearing Restrictions: Yes RUE Weight Bearing: Weight bearing as tolerated RLE Weight Bearing: Non weight bearing LLE Weight Bearing: Weight bearing as tolerated (WB for transfers  only) Other Position/Activity Restrictions: LLE WBAT for transfers only     Mobility  Bed Mobility Overal bed mobility: Needs Assistance Bed Mobility: Rolling, Sidelying to Sit Rolling: Mod assist Sidelying to sit: Mod assist, +2 for safety/equipment, HOB elevated, Used rails       General bed mobility comments: cues to sequence walking LE's off EOB and for use of bed rail to turn trunk. Mod+2 asisst with use of bed pad to pivot hips required.    Transfers Overall transfer level: Needs assistance Equipment used: Sliding board Transfers: Bed to chair/wheelchair/BSC            Lateral/Scoot Transfers: Mod assist, +2 physical assistance, +2 safety/equipment, With slide board General transfer comment: Pt required step by step cues for sequencing lateral scoot with slideboard. mod assit for set up of board with Rt lateral forearm prop to place board under Lt hip. Mod+2 with cues for head/trunk lean Rt to scoot Lt and maintain NWB on Rt LE.    Ambulation/Gait                   Stairs             Wheelchair Mobility     Tilt Bed    Modified Rankin (Stroke Patients Only)       Balance Overall balance assessment: Needs assistance Sitting-balance support: Feet supported, Bilateral upper extremity supported Sitting balance-Leahy Scale: Poor Sitting balance - Comments: discomfort while seated on EOB with patient demonstrating SOB, heavy realiance on UE support for seated balance, anterior lean  Cognition Arousal: Alert Behavior During Therapy: WFL for tasks assessed/performed Overall Cognitive Status: Impaired/Different from baseline Area of Impairment: Following commands, Memory, Safety/judgement, Awareness, Problem solving                     Memory: Decreased recall of precautions, Decreased short-term memory Following Commands: Follows one step commands consistently, Follows one step commands  with increased time (with repetition) Safety/Judgement: Decreased awareness of deficits, Decreased awareness of safety Awareness: Intellectual, Emergent Problem Solving: Slow processing, Requires verbal cues, Requires tactile cues, Difficulty sequencing General Comments: pt abe to verbalize Rt LE NWB precautions but poor awareness of safety and precautions throughout with pt attempting to stand from EOB after revieweing restrictions and being told we were performing slide board transfer. Pt slighlty  impulsive and rushing to complete required cues to slow down and for sequencing mobility bed>chair.        Exercises Other Exercises Other Exercises: IS and flutter valve at EOS, 5 reps each.    General Comments General comments (skin integrity, edema, etc.): At end of session BP 148/73, HR 98, SpO2 95% on 2 liters      Pertinent Vitals/Pain Pain Assessment Pain Assessment: Faces Faces Pain Scale: Hurts even more Pain Location: low back right hip Pain Descriptors / Indicators: Grimacing Pain Intervention(s): Limited activity within patient's tolerance, Monitored during session, Repositioned    Home Living                          Prior Function            PT Goals (current goals can now be found in the care plan section) Acute Rehab PT Goals Patient Stated Goal: to reduce pain, return to independence PT Goal Formulation: With patient Time For Goal Achievement: 05/09/23 Potential to Achieve Goals: Fair Progress towards PT goals: Progressing toward goals    Frequency    Min 1X/week      PT Plan      Co-evaluation PT/OT/SLP Co-Evaluation/Treatment: Yes Reason for Co-Treatment: For patient/therapist safety;To address functional/ADL transfers   OT goals addressed during session: Proper use of Adaptive equipment and DME;Strengthening/ROM      AM-PAC PT "6 Clicks" Mobility   Outcome Measure  Help needed turning from your back to your side while in a flat bed  without using bedrails?: A Lot Help needed moving from lying on your back to sitting on the side of a flat bed without using bedrails?: A Lot Help needed moving to and from a bed to a chair (including a wheelchair)?: Total Help needed standing up from a chair using your arms (e.g., wheelchair or bedside chair)?: Total Help needed to walk in hospital room?: Total Help needed climbing 3-5 steps with a railing? : Total 6 Click Score: 8    End of Session Equipment Utilized During Treatment: Gait belt;Oxygen Activity Tolerance: Patient tolerated treatment well Patient left: in chair;with call bell/phone within reach;with chair alarm set Nurse Communication: Mobility status;Need for lift equipment PT Visit Diagnosis: Other abnormalities of gait and mobility (R26.89);Muscle weakness (generalized) (M62.81);Pain Pain - Right/Left: Right Pain - part of body: Hip     Time: 0950-1015 PT Time Calculation (min) (ACUTE ONLY): 25 min  Charges:    $Therapeutic Activity: 8-22 mins PT General Charges $$ ACUTE PT VISIT: 1 Visit                     Renaldo Fiddler PT, DPT Acute  Rehabilitation Services Office [phone]  05/05/23 12:01 PM

## 2023-05-05 NOTE — Progress Notes (Signed)
Patient ID: Louis Juarez, male   DOB: [birth year], 82 y.o.   MRN: [id number] 11 Days Post-Op    Subjective: Reports R elbow feels better but having pain with pelvic FX grinding when transferring ROS negative except as listed above. Objective: Vital signs in last 24 hours: Temp:  [97.7 F (36.5 C)-98.6 F (37 C)] 98 F (36.7 C) (10/15 0805) Pulse Rate:  [79-88] 84 (10/15 0900) Resp:  [20-29] 22 (10/15 0900) BP: (140-153)/(65-80) 153/67 (10/15 0805) SpO2:  [94 %-97 %] 96 % (10/15 0900) Weight:  [81.5 kg] 81.5 kg (10/15 0152) Last BM Date : 05/03/23 (PTA)  Intake/Output from previous day: 10/14 0701 - 10/15 0700 In: 202.4 [I.V.:10; IV Piggyback:192.4] Out: 4400 [Urine:4400] Intake/Output this shift: Total I/O In: 10 [I.V.:10] Out: 750 [Urine:750]  General appearance: alert and cooperative Resp: clear to auscultation bilaterally GI: soft, mild dist, NT Extremities: R elbow dressed  Lab Results: CBC  Recent Labs    05/04/23 0648 05/05/23 0536  WBC 21.7* 23.6*  HGB 8.9* 9.1*  HCT 28.5* 28.3*  PLT 482* 569*   BMET Recent Labs    05/04/23 0649 05/05/23 0536  NA 132* 133*  K 5.2* 4.2  CL 99 96*  CO2 23 26  GLUCOSE 137* 139*  BUN 32* 25*  CREATININE 0.97 0.93  CALCIUM 8.2* 8.9   PT/INR No results for input(s): "LABPROT", "INR" in the last 72 hours. ABG No results for input(s): "PHART", "HCO3" in the last 72 hours.  Invalid input(s): "PCO2", "PO2"  Studies/Results: DG Chest Port 1 View  Result Date: 05/05/2023 CLINICAL DATA:  Respiratory failure.  Shortness of breath. EXAM: PORTABLE CHEST 1 VIEW COMPARISON:  05/04/2023 FINDINGS: Pacemaker/AICD remains in place. Heart size is normal. There is aortic atherosclerosis. Left lung is clear except for some mildly prominent chronic interstitial markings. Asymmetric density in the right lower lung relates to a pleural effusion with dependent atelectasis, better shown by the CT scan of 3 days ago. No worsening or new  finding. IMPRESSION: Persistent right pleural effusion with dependent atelectasis. No worsening or new finding. Electronically Signed   By: Paulina Fusi M.D.   On: 05/05/2023 10:12   DG Pelvis Comp Min 3V  Result Date: 05/04/2023 CLINICAL DATA:  [id number] Pelvic fracture (HCC) [id number] EXAM: JUDET PELVIS - 3+ VIEW COMPARISON:  04/25/2023 FINDINGS: Compared to the previous radiograph from 04/24/2023, there is increased displacement of the medial right iliac fracture. The washer associated with the SI joint fusion screw has medially migrated by 1 cm. A hardware is otherwise intact. Increased pubic diastasis now measuring approximately 3.2 cm (previously 2.4 cm). Right-sided pubic rami fractures remain moderately displaced. No new fractures. Hip joint alignment maintained without dislocation. IMPRESSION: 1. Increased displacement of the medial right iliac fracture. 2. Increased pubic diastasis now measuring approximately 3.2 cm (previously 2.4 cm). 3. Right-sided pubic rami fractures remain moderately displaced. Electronically Signed   By: Duanne Guess D.O.   On: 05/04/2023 18:47   DG CHEST PORT 1 VIEW  Result Date: 05/04/2023 CLINICAL DATA:  Right-sided pleural effusion. EXAM: PORTABLE CHEST 1 VIEW COMPARISON:  Radiograph 05/01/2023. CT 04/24/2023, lung bases from abdominopelvic CT 05/02/2023 FINDINGS: Left-sided pacemaker in place. Hazy opacity at the right lung base likely corresponds to effusion on recent abdominopelvic CT. Slight bronchovascular crowding without frank pulmonary edema. The heart is normal in size. No pneumothorax. Known rib fractures are not well demonstrated. IMPRESSION: Hazy opacity at the right lung base likely corresponds to effusion on recent abdominopelvic CT.  Electronically Signed   By: Narda Rutherford M.D.   On: 05/04/2023 09:32    Anti-infectives: Anti-infectives (From admission, onward)    Start     Dose/Rate Route Frequency Ordered Stop   05/03/23 1100   piperacillin-tazobactam (ZOSYN) IVPB 3.375 g        3.375 g 12.5 mL/hr over 240 Minutes Intravenous Every 8 hours 05/03/23 1010     04/24/23 2300  ceFAZolin (ANCEF) IVPB 2g/100 mL premix        2 g 200 mL/hr over 30 Minutes Intravenous Every 8 hours 04/24/23 1900 04/25/23 1557   04/24/23 1714  vancomycin (VANCOCIN) powder  Status:  Discontinued          As needed 04/24/23 1715 04/24/23 1749   04/24/23 1300  metroNIDAZOLE (FLAGYL) IVPB 500 mg        500 mg 100 mL/hr over 60 Minutes Intravenous  Once 04/24/23 1257 04/24/23 1459   04/24/23 1215  ceFAZolin (ANCEF) IVPB 2g/100 mL premix        2 g 200 mL/hr over 30 Minutes Intravenous  Once 04/24/23 1202 04/24/23 1258       Assessment/Plan: Fall from ladder, 20 ft   Hemmorrhagic shock - resolved Pelvic fractures (R iliac bone, R pubic rami, and right and left sacrum with 10cm diastasis of pubic symphysis) -  Foley and binder placed in trauma bay. OR ortho 10/4 perc fixation of numerous pelvic fxs, ORIF right elbow. BLE - bed to chair transfers per ortho. CT over weekend showed increased displacement of right iliac wing FX. Now NWB RLE nd WB LLE for transfers only, further imaging later this week Open R elbow/olecranon fx - Per Ortho --> I&D 10/4  R 6-12th rib fxs - multimodal pain control, pulm toilet,  CXR today appears stable, some haziness that looks like mild pulm edema Right pleural effusion - not present on CT from admission, will review with MD. I dont think he needs a thora right now. R T10 TP fxs - multimodal pain control.  Age-indeterminate mild compression fractures of T4, T7 and T12 - discussed with NSGY Dr. Jordan Likes who reviewed images. Does not need OR or brace/spine precautions. Incidental findings - Pulmonary nodules and Severe atherosclerotic calcifications of the left carotid bulb and proximal left cervical ICA. 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 FEN: Heart healthy diet, SLIV. +BM  ID: Tdap in ED, Ancef in ED for open fracture; Zosyn started 10/13 due to colitis noted on CT and possible RLL pneumonia. Purulent D/C from R elbow - opened by Trauma Ortho, may need I&D per Dr. Jena Gauss though much less pain today               VTE: SCDs, LMWH Foley - replaced Dispo: 4NP, Trauma Ortho F/U, PT/OT, anticipate SNF  LOS: 11 days    Violeta Gelinas, MD, MPH, FACS Trauma & General Surgery Use [email or web address] to contact on call provider  05/05/2023

## 2023-05-06 LAB — MAGNESIUM: Magnesium: 1.9 mg/dL (ref 1.7–2.4)

## 2023-05-06 LAB — CBC
HCT: 28.7 % — ABNORMAL LOW (ref 39.0–52.0)
Hemoglobin: 9.3 g/dL — ABNORMAL LOW (ref 13.0–17.0)
MCH: 31.5 pg (ref 26.0–34.0)
MCHC: 32.4 g/dL (ref 30.0–36.0)
MCV: 97.3 fL (ref 80.0–100.0)
Platelets: 674 10*3/uL — ABNORMAL HIGH (ref 150–400)
RBC: 2.95 MIL/uL — ABNORMAL LOW (ref 4.22–5.81)
RDW: 16.6 % — ABNORMAL HIGH (ref 11.5–15.5)
WBC: 22.4 10*3/uL — ABNORMAL HIGH (ref 4.0–10.5)
nRBC: 0.1 % (ref 0.0–0.2)

## 2023-05-06 LAB — RENAL FUNCTION PANEL
Albumin: 2.2 g/dL — ABNORMAL LOW (ref 3.5–5.0)
Anion gap: 10 (ref 5–15)
BUN: 21 mg/dL (ref 8–23)
CO2: 22 mmol/L (ref 22–32)
Calcium: 8.7 mg/dL — ABNORMAL LOW (ref 8.9–10.3)
Chloride: 101 mmol/L (ref 98–111)
Creatinine, Ser: 0.79 mg/dL (ref 0.61–1.24)
GFR, Estimated: >60 mL/min (ref >60)
Glucose, Bld: 144 mg/dL — ABNORMAL HIGH (ref 70–99)
Phosphorus: 3 mg/dL (ref 2.5–4.6)
Potassium: 4.6 mmol/L (ref 3.5–5.1)
Sodium: 133 mmol/L — ABNORMAL LOW (ref 135–145)

## 2023-05-06 MED ORDER — SODIUM CHLORIDE 1 G PO TABS
1.0000 g | ORAL_TABLET | Freq: Three times a day (TID) | ORAL | Status: DC
  Administered 2023-05-06 – 2023-05-07 (×3): 1 g via ORAL
  Filled 2023-05-06 (×3): qty 1

## 2023-05-06 NOTE — Plan of Care (Signed)

## 2023-05-06 NOTE — H&P (View-Only) (Signed)
Ortho Trauma Note   Patient uncomfortable in regards to his pelvis.  Yesterday he felt the pelvis moving and shifting.  Feels better in regards to the elbow after I opened up his sutures.  I have noted the imaging that trauma team has uploaded.  I did not change the dressing today.  I discussed with the wife and the patient how his imaging shows that the pelvic hardware has failed and the right sided hemipelvis is likely moving.  I feel that to provide the best stabilization that we likely would need an open repair of the pubic symphysis with likely open repair of the posterior SI joint.  I discussed with him that nonoperative management could be a reasonable option however I think he would be at high risk of nonunion and would be in the same position 6 weeks down the road.  I discussed risks and benefits with the patient and his wife.  After full discussion they wish to pursue revision fixation.  We will plan to proceed Friday morning.  Maintain same weightbearing precautions until then.  Okay to mobilize with physical therapy up until surgery.  Depending on how his elbow looks at that point we may proceed with I&D after the revision fixation of his pelvis on Friday.  Roby Lofts, MD [facility] [phone] (office) [email or web address]

## 2023-05-06 NOTE — Progress Notes (Signed)
Inpatient Rehabilitation Admissions Coordinator   Met at bedside with patient and wife with Nursing. Explained that I await further medical workup before pursuing rehab venue options.  Ottie Glazier, RN, MSN Rehab Admissions Coordinator [phone] 05/06/2023 11:59 AM

## 2023-05-06 NOTE — Evaluation (Signed)
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   Chest pain    CHF (congestive heart failure) (HCC)    Diabetes mellitus without complication (HCC)    Dyspnea    ED (erectile dysfunction)    GERD (gastroesophageal reflux disease)    Hypertension    Insomnia    LBBB (left bundle branch block)    Mixed hyperlipidemia    Nonischemic cardiomyopathy (HCC)    Palpitations    Pneumonia    PVC's (premature ventricular contractions)    Wheezing    Past Surgical History:  Past Surgical History:  Procedure Laterality Date   BI-VENTRICULAR IMPLANTABLE CARDIOVERTER DEFIBRILLATOR  (CRT-D)  02/13/2012   BI-VENTRICULAR IMPLANTABLE CARDIOVERTER DEFIBRILLATOR  (CRT-D)  05/03/2019   CHANGEOUT   CARDIAC CATHETERIZATION  2013   IRRIGATION AND DEBRIDEMENT ELBOW Right 04/24/2023   Procedure: IRRIGATION AND DEBRIDEMENT ELBOW;  Surgeon: Roby Lofts, MD;  Location: [facility] OR;  Service: Orthopedics;  Laterality: Right;   NASAL SINUS SURGERY     1993, 1997, 2006, 2010   ORIF PELVIC FRACTURE WITH PERCUTANEOUS SCREWS Bilateral 04/24/2023   Procedure: ORIF PELVIC FRACTURE WITH PERCUTANEOUS SCREWS;  Surgeon: Roby Lofts, MD;  Location: [facility] OR;  Service: Orthopedics;  Laterality: Bilateral;   HPI:  82 y.o. male presents to [facility] hospital on 04/24/2023 after fall from ladder. Pt found to have open book pelvic fx, open R olceranon fx, R rib 6-12 fx, R T10 TP fx, and age-indeterminate mild compression fxs of T4, T7 and T12. Pt underwent percutaneous fixation of pelvic fxs and open treatment of R olecranon fx on 10/4. PMH includes  heart failure, ICD.    Assessment / Plan / Recommendation  Clinical Impression  Pt did not exhibit any signs of aspiration. He did need cues and assist to position himself upright and states that positioning has been a challenge. He has not had an appetite but also reports its hard to feed himself, cut up his food and that food is often dry. SLP suggested switching to a mech soft diet so foods will be cut up. Also suggested following bites with sips to moisten solids and help with transit. Showed pt use of bed remote to sit himself up. Will modify diet, no f/u needed. If pt chooses to resume a regular diet it can be changed without safety concern. SLP Visit Diagnosis: Dysphagia, unspecified (R13.10)    Aspiration Risk  Risk for inadequate nutrition/hydration    Diet Recommendation Dysphagia 3 (Mech soft);Thin liquid    Liquid Administration via: Cup;Straw Medication Administration: Whole meds with liquid Supervision: Patient able to self feed Compensations: Follow solids with liquid Postural Changes: Seated upright at 90 degrees    Other  Recommendations      Recommendations for follow up therapy are one component of a multi-disciplinary discharge planning process, led by the attending physician.  Recommendations may be updated based on patient status, additional functional criteria and insurance authorization.  Follow up Recommendations        Assistance Recommended at Discharge    Functional Status Assessment    Frequency and Duration  Prognosis        Swallow Study   General HPI: 82 y.o. male presents to [facility] hospital on 04/24/2023 after fall from ladder. Pt found to have open book pelvic fx, open R olceranon fx, R rib 6-12 fx, R T10 TP fx, and age-indeterminate mild compression fxs of T4, T7 and T12. Pt underwent percutaneous fixation of pelvic fxs and open treatment of R olecranon fx on 10/4. PMH includes heart failure, ICD. Type of Study: Bedside Swallow  Evaluation Diet Prior to this Study: Regular;Thin liquids (Level 0) Temperature Spikes Noted: No Respiratory Status: Room air History of Recent Intubation: No Behavior/Cognition: Alert;Cooperative;Pleasant mood Oral Cavity Assessment: Within Functional Limits Oral Care Completed by SLP: No Oral Cavity - Dentition: Adequate natural dentition Vision: Functional for self-feeding Self-Feeding Abilities: Able to feed self Patient Positioning: Upright in bed Baseline Vocal Quality: Normal Volitional Cough: Congested;Weak Volitional Swallow: Able to elicit    Oral/Motor/Sensory Function Overall Oral Motor/Sensory Function: Within functional limits   Ice Chips     Thin Liquid Thin Liquid: Within functional limits Presentation: Self Fed;Straw    Nectar Thick Nectar Thick Liquid: Not tested   Honey Thick Honey Thick Liquid: Not tested   Puree Puree: Not tested   Solid     Solid: Impaired Presentation: Self Fed Oral Phase Functional Implications: Prolonged oral transit      Alphonza Tramell, Riley Nearing 05/06/2023,11:53 AM

## 2023-05-06 NOTE — Progress Notes (Addendum)
Patient ID: Louis Juarez, male   DOB: [birth year], 82 y.o.   MRN: [id number] 12 Days Post-Op    Subjective: No new complaints, confirms pelvic pain and audible "crunching" with movement. Toleraing PO. Denies SOB.   ROS negative except as listed above. Objective: Vital signs in last 24 hours: Temp:  [97.5 F (36.4 C)-98.2 F (36.8 C)] 98.2 F (36.8 C) (10/16 0816) Pulse Rate:  [77-85] 77 (10/16 0816) Resp:  [18-25] 20 (10/16 0816) BP: (138-152)/(64-82) 147/69 (10/16 0816) SpO2:  [88 %-95 %] 95 % (10/16 0816) Weight:  [85.9 kg] 85.9 kg (10/16 0312) Last BM Date : 05/03/23  Intake/Output from previous day: 10/15 0701 - 10/16 0700 In: 20 [I.V.:20] Out: 1925 [Urine:1925] Intake/Output this shift: Total I/O In: 240 [P.O.:240] Out: -   General appearance: alert and cooperative Resp: clear to auscultation bilaterally, O2 sats 97% on nasal cannula GI: soft, mild dist, NT Extremities: R elbow dressing removed. See photo below - opening of medial portion of incision with fibrinopurulent exudate along with serous and purulent drainage. there is some warmth and cellulitis of lateral aspect of his incision that is not well demonstrated in the image below.   Lab Results: CBC  Recent Labs    05/05/23 0536 05/06/23 0614  WBC 23.6* 22.4*  HGB 9.1* 9.3*  HCT 28.3* 28.7*  PLT 569* 674*   BMET Recent Labs    05/05/23 0536 05/06/23 0617  NA 133* 133*  K 4.2 4.6  CL 96* 101  CO2 26 22  GLUCOSE 139* 144*  BUN 25* 21  CREATININE 0.93 0.79  CALCIUM 8.9 8.7*   PT/INR No results for input(s): "LABPROT", "INR" in the last 72 hours. ABG No results for input(s): "PHART", "HCO3" in the last 72 hours.  Invalid input(s): "PCO2", "PO2"  Studies/Results: DG Chest Port 1 View  Result Date: 05/05/2023 CLINICAL DATA:  Respiratory failure.  Shortness of breath. EXAM: PORTABLE CHEST 1 VIEW COMPARISON:  05/04/2023 FINDINGS: Pacemaker/AICD remains in place. Heart size is normal. There is  aortic atherosclerosis. Left lung is clear except for some mildly prominent chronic interstitial markings. Asymmetric density in the right lower lung relates to a pleural effusion with dependent atelectasis, better shown by the CT scan of 3 days ago. No worsening or new finding. IMPRESSION: Persistent right pleural effusion with dependent atelectasis. No worsening or new finding. Electronically Signed   By: Paulina Fusi M.D.   On: 05/05/2023 10:12   DG Pelvis Comp Min 3V  Result Date: 05/04/2023 CLINICAL DATA:  [id number] Pelvic fracture (HCC) [id number] EXAM: JUDET PELVIS - 3+ VIEW COMPARISON:  04/25/2023 FINDINGS: Compared to the previous radiograph from 04/24/2023, there is increased displacement of the medial right iliac fracture. The washer associated with the SI joint fusion screw has medially migrated by 1 cm. A hardware is otherwise intact. Increased pubic diastasis now measuring approximately 3.2 cm (previously 2.4 cm). Right-sided pubic rami fractures remain moderately displaced. No new fractures. Hip joint alignment maintained without dislocation. IMPRESSION: 1. Increased displacement of the medial right iliac fracture. 2. Increased pubic diastasis now measuring approximately 3.2 cm (previously 2.4 cm). 3. Right-sided pubic rami fractures remain moderately displaced. Electronically Signed   By: Duanne Guess D.O.   On: 05/04/2023 18:47    Anti-infectives: Anti-infectives (From admission, onward)    Start     Dose/Rate Route Frequency Ordered Stop   05/03/23 1100  piperacillin-tazobactam (ZOSYN) IVPB 3.375 g        3.375 g 12.5 mL/hr over 240  Minutes Intravenous Every 8 hours 05/03/23 1010     04/24/23 2300  ceFAZolin (ANCEF) IVPB 2g/100 mL premix        2 g 200 mL/hr over 30 Minutes Intravenous Every 8 hours 04/24/23 1900 04/25/23 1557   04/24/23 1714  vancomycin (VANCOCIN) powder  Status:  Discontinued          As needed 04/24/23 1715 04/24/23 1749   04/24/23 1300  metroNIDAZOLE (FLAGYL)  IVPB 500 mg        500 mg 100 mL/hr over 60 Minutes Intravenous  Once 04/24/23 1257 04/24/23 1459   04/24/23 1215  ceFAZolin (ANCEF) IVPB 2g/100 mL premix        2 g 200 mL/hr over 30 Minutes Intravenous  Once 04/24/23 1202 04/24/23 1258       Assessment/Plan: Fall from ladder, 20 ft   Hemmorrhagic shock - resolved Pelvic fractures (R iliac bone, R pubic rami, and right and left sacrum with 10cm diastasis of pubic symphysis) -  Foley and binder placed in trauma bay. OR ortho 10/4 perc fixation of numerous pelvic fxs, ORIF right elbow. BLE - bed to chair transfers per ortho. CT over weekend showed increased displacement of right iliac wing FX. Now NWB RLE nd WB LLE for transfers only, further imaging later this week, possible OR Friday per Dr. Jena Gauss Open R elbow/olecranon fx - Per Ortho --> I&D 10/4, infection noted 10/14 and Dr Jena Gauss removed a couple of sutures. IV Zosyn continued. R 6-12th rib fxs - multimodal pain control, pulm toilet, CXR yesterday appears stable, some haziness that looks like mild pulm edema, s/p lasix 10/14. Follow clinically today, CXR in AM. Right pleural effusion - not present on CT from admission, will review with MD. I dont think he needs a thora right now. R T10 TP fxs - multimodal pain control.  Age-indeterminate mild compression fractures of T4, T7 and T12 - discussed with NSGY Dr. Jordan Likes who reviewed images. Does not need OR or brace/spine precautions. Incidental findings - Pulmonary nodules and Severe atherosclerotic calcifications of the left carotid bulb and proximal left cervical ICA. Will need pcp f/u for further w/u as outpatient  CHF/DCM - POA ICD in place HTN - Low BP 10/15,  all antihypertensives and lasix we discontinued. BP 147/69 today, monitor. Resume anti-HTN when appropriate.  FEN: Heart healthy diet, SLIV. +BM  ID: Tdap in ED, Ancef in ED for open fracture; Zosyn started 10/13 due to colitis noted on CT and possible RLL pneumonia. Purulent D/C  from R elbow - opened by Trauma Ortho, may need I&D Friday if not improved per Dr. Jena Gauss               VTE: SCDs, LMWH Foley - replaced Dispo: 4NP, Trauma Ortho F/U, PT/OT, anticipate SNF vs CIR    LOS: 12 days    Hosie Spangle, PA-C [facility] Use [email or web address] to contact on call provider  05/06/2023

## 2023-05-06 NOTE — Progress Notes (Signed)
Occupational Therapy Treatment Patient Details Name: Louis Juarez MRN: [id number] DOB: Dec 01, 1940 Today's Date: 05/06/2023   History of present illness 82 y.o. male presents to [facility] hospital on 04/24/2023 after fall from ladder. Pt found to have open book pelvic fx, open R olceranon fx, R rib 6-12 fx, R T10 TP fx, and age-indeterminate mild compression fxs of T4, T7 and T12. Pt underwent percutaneous fixation of pelvic fxs and open treatment of R olecranon fx on 10/4. PMH includes heart failure, ICD.   OT comments  Pt session completed at bed level focused on activation of R UE for adls task. Pt unable to tolerate increased HOB at this time and noted to have pending surgery Friday to address. Pt participating and thanking therapists at the end of session. Recommendations for Patient will benefit from intensive inpatient follow up therapy, >3 hours/day Remains appropriate.       If plan is discharge home, recommend the following:  Two people to help with walking and/or transfers;A lot of help with bathing/dressing/bathroom;Assistance with cooking/housework;Assist for transportation;Help with stairs or ramp for entrance;Supervision due to cognitive status   Equipment Recommendations  Wheelchair (measurements OT);Wheelchair cushion (measurements OT);Hospital bed    Recommendations for Other Services      Precautions / Restrictions Precautions Precautions: Fall Restrictions Weight Bearing Restrictions: Yes RUE Weight Bearing: Non weight bearing RLE Weight Bearing: Non weight bearing LLE Weight Bearing: Weight bearing as tolerated Other Position/Activity Restrictions: LLE WBAT for transfers only       Mobility Bed Mobility               General bed mobility comments: repositioned with bil LE down and hob increased to simulate chair as much as possible within pt comfort levels. two person (A) to scoot to [facility]    Transfers                   General transfer comment: not  attempted at this time.     Balance                                           ADL either performed or assessed with clinical judgement   ADL Overall ADL's : Needs assistance/impaired     Grooming: Moderate assistance;Bed level Grooming Details (indicate cue type and reason): shaving face with electric razor then blade razor .                               General ADL Comments: pt did not tolerate increased HOB well and was unable to reach past 45 degrees to do adl task. pt pending surgery friday so working from bed level to have R UE activation for adls    Extremity/Trunk Assessment Upper Extremity Assessment Upper Extremity Assessment: Right hand dominant RUE Deficits / Details: R olecranon fx with ORIF dressing pending additional I&D friday in OR. pt lacks full extension slighly. Full ROM of wrist and digits . pt can do pad to pad   Lower Extremity Assessment Lower Extremity Assessment: Defer to PT evaluation        Vision       Perception     Praxis      Cognition Arousal: Alert Behavior During Therapy: [facility] for tasks assessed/performed Overall Cognitive Status: Impaired/Different from baseline Area of Impairment: Memory, Following commands, Awareness  Memory: Decreased recall of precautions, Decreased short-term memory Following Commands: Follows one step commands inconsistently, Follows one step commands with increased time       General Comments: pt requesting medication and unaware he was given medication. pt needed additional cues with facial grooming task        Exercises Exercises: Other exercises Other Exercises Other Exercises: pad to pad taps Other Exercises: elbow flexion and extension Other Exercises: wrist flexion and extension Other Exercises: elevation on pillow for edema management    Shoulder Instructions       General Comments [place] removed for shaving and noted ot hae slow  downward trend. pt with [place] replaced at 92%. pt coughing and producing muscus this session    Pertinent Vitals/ Pain       Pain Assessment Pain Assessment: Faces Faces Pain Scale: Hurts even more Pain Location: pelvis Pain Descriptors / Indicators: Grimacing Pain Intervention(s): Monitored during session, Limited activity within patient's tolerance, Premedicated before session, Repositioned  Home Living                                          Prior Functioning/Environment              Frequency  Min 1X/week        Progress Toward Goals  OT Goals(current goals can now be found in the care plan section)  Progress towards OT goals: Progressing toward goals  Acute Rehab OT Goals Patient Stated Goal: feels good to feel clean OT Goal Formulation: With patient Time For Goal Achievement: 05/20/23 Potential to Achieve Goals: Good ADL Goals Pt Will Perform Grooming: Independently;sitting Pt Will Perform Lower Body Bathing: with mod assist;sit to/from stand;sitting/lateral leans Pt Will Perform Lower Body Dressing: with mod assist;sit to/from stand;sitting/lateral leans Pt Will Transfer to Toilet: with mod assist;bedside commode;stand pivot transfer Pt Will Perform Toileting - Clothing Manipulation and hygiene: with mod assist;sitting/lateral leans;sit to/from stand Additional ADL Goal #2: Patient will be able to complete bed mobility at min A level as a precursor to OOB activities.  Plan      Co-evaluation                 AM-PAC OT "6 Clicks" Daily Activity     Outcome Measure   Help from another person eating meals?: A Little Help from another person taking care of personal grooming?: A Little Help from another person toileting, which includes using toliet, bedpan, or urinal?: A Lot Help from another person bathing (including washing, rinsing, drying)?: A Lot Help from another person to put on and taking off regular upper body clothing?: A  Lot Help from another person to put on and taking off regular lower body clothing?: Total 6 Click Score: 13    End of Session Equipment Utilized During Treatment: Oxygen  OT Visit Diagnosis: Unsteadiness on feet (R26.81);Muscle weakness (generalized) (M62.81)   Activity Tolerance Patient tolerated treatment well   Patient Left in bed;with call bell/phone within reach;with bed alarm set   Nurse Communication Mobility status;Precautions        Time: 1400-1431 OT Time Calculation (min): 31 min  Charges: OT General Charges $OT Visit: 1 Visit OT Treatments $Self Care/Home Management : 8-22 mins   Brynn, OTR/L  Acute Rehabilitation Services Office: [phone] .   Mateo Flow 05/06/2023, 6:11 PM

## 2023-05-06 NOTE — Progress Notes (Signed)
Ortho Trauma Note   Patient uncomfortable in regards to his pelvis.  Yesterday he felt the pelvis moving and shifting.  Feels better in regards to the elbow after I opened up his sutures.  I have noted the imaging that trauma team has uploaded.  I did not change the dressing today.  I discussed with the wife and the patient how his imaging shows that the pelvic hardware has failed and the right sided hemipelvis is likely moving.  I feel that to provide the best stabilization that we likely would need an open repair of the pubic symphysis with likely open repair of the posterior SI joint.  I discussed with him that nonoperative management could be a reasonable option however I think he would be at high risk of nonunion and would be in the same position 6 weeks down the road.  I discussed risks and benefits with the patient and his wife.  After full discussion they wish to pursue revision fixation.  We will plan to proceed Friday morning.  Maintain same weightbearing precautions until then.  Okay to mobilize with physical therapy up until surgery.  Depending on how his elbow looks at that point we may proceed with I&D after the revision fixation of his pelvis on Friday.  Roby Lofts, MD [facility] [phone] (office) [email or web address]

## 2023-05-07 ENCOUNTER — Inpatient Hospital Stay (HOSPITAL_COMMUNITY): Payer: BC Managed Care – PPO

## 2023-05-07 LAB — CBC
HCT: 27.8 % — ABNORMAL LOW (ref 39.0–52.0)
Hemoglobin: 8.9 g/dL — ABNORMAL LOW (ref 13.0–17.0)
MCH: 31.6 pg (ref 26.0–34.0)
MCHC: 32 g/dL (ref 30.0–36.0)
MCV: 98.6 fL (ref 80.0–100.0)
Platelets: 658 10*3/uL — ABNORMAL HIGH (ref 150–400)
RBC: 2.82 MIL/uL — ABNORMAL LOW (ref 4.22–5.81)
RDW: 16.6 % — ABNORMAL HIGH (ref 11.5–15.5)
WBC: 21.1 10*3/uL — ABNORMAL HIGH (ref 4.0–10.5)
nRBC: 0.2 % (ref 0.0–0.2)

## 2023-05-07 LAB — MAGNESIUM: Magnesium: 1.9 mg/dL (ref 1.7–2.4)

## 2023-05-07 LAB — RENAL FUNCTION PANEL
Albumin: 2.2 g/dL — ABNORMAL LOW (ref 3.5–5.0)
Anion gap: 8 (ref 5–15)
BUN: 16 mg/dL (ref 8–23)
CO2: 24 mmol/L (ref 22–32)
Calcium: 8.5 mg/dL — ABNORMAL LOW (ref 8.9–10.3)
Chloride: 99 mmol/L (ref 98–111)
Creatinine, Ser: 0.76 mg/dL (ref 0.61–1.24)
GFR, Estimated: >60 mL/min (ref >60)
Glucose, Bld: 149 mg/dL — ABNORMAL HIGH (ref 70–99)
Phosphorus: 2.9 mg/dL (ref 2.5–4.6)
Potassium: 4.4 mmol/L (ref 3.5–5.1)
Sodium: 131 mmol/L — ABNORMAL LOW (ref 135–145)

## 2023-05-07 MED ORDER — SODIUM CHLORIDE 1 G PO TABS
2.0000 g | ORAL_TABLET | Freq: Three times a day (TID) | ORAL | Status: DC
  Administered 2023-05-07: 2 g via ORAL
  Filled 2023-05-07 (×2): qty 2

## 2023-05-07 NOTE — Progress Notes (Signed)
Patient ID: Louis Juarez, male   DOB: 07-23-1940, 82 y.o.   MRN: [id number] 13 Days Post-Op    Subjective: No complaints. Pulling 1000  ROS negative except as listed above. Objective: Vital signs in last 24 hours: Temp:  [97.6 F (36.4 C)-98.8 F (37.1 C)] 97.7 F (36.5 C) (10/17 0734) Pulse Rate:  [70-100] 70 (10/17 0734) Resp:  [18-28] 18 (10/17 0734) BP: (141-154)/(58-91) 150/67 (10/17 0734) SpO2:  [95 %-98 %] 95 % (10/17 0734) Weight:  [87.9 kg] 87.9 kg (10/17 0315) Last BM Date : 05/03/23  Intake/Output from previous day: 10/16 0701 - 10/17 0700 In: 960 [P.O.:960] Out: 1700 [Urine:1700] Intake/Output this shift: No intake/output data recorded.  General appearance: alert and cooperative Resp: normal work of breathing on 2L [place], pulling 1000 on IS GI: soft, mild dist, NT Extremities: R elbow sutures remain in place. Some fibrinous exudate but improved from photo reviewed from yesterday. Cellulitis improving.  Lab Results: I personally reviewed all labs for the past 24h  Studies/Results: I personally reviewed all imaging for the past 24h   Assessment/Plan: Fall from ladder, 20 ft   Hemmorrhagic shock - resolved Pelvic fractures (R iliac bone, R pubic rami, and right and left sacrum with 10cm diastasis of pubic symphysis) -  Foley and binder placed in trauma bay. OR ortho 10/4 perc fixation of numerous pelvic fxs, ORIF right elbow. CT over weekend showed increased displacement of right iliac wing FX. - NWB RLE and WB LLE for transfers only, further imaging later this week. - Possible OR Friday per Dr. Jena Gauss Open R elbow/olecranon fx - Per Ortho --> I&D 10/4, infection noted 10/14 and Dr Jena Gauss removed a couple of sutures.   - Continue IV Zosyn R 6-12th rib fxs - multimodal pain control, pulm toilet, some pul Right pleural effusion - improving on today's CXR, pulmonary edema - Will give 40 of lasix R T10 TP fxs - multimodal pain control.  Age-indeterminate mild  compression fractures of T4, T7 and T12 - discussed with NSGY Dr. Jordan Likes who reviewed images. Does not need OR or brace/spine precautions. Incidental findings - Pulmonary nodules and Severe atherosclerotic calcifications of the left carotid bulb and proximal left cervical ICA. 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 BP has been stable SBP 140s-150s, will follow after lasix today and if still high will start to reintroduce home antiHTNs FEN: Heart healthy diet, SLIV. +BM  ID: Tdap in ED, Ancef in ED for open fracture; Zosyn started 10/13 due to colitis noted on CT and possible RLL pneumonia. Purulent D/C from R elbow - opened by Trauma Ortho, may need I&D Friday if not improved per Dr. Jena Gauss               VTE: SCDs, LMWH Foley - replaced Dispo: 4NP, Trauma Ortho F/U, PT/OT, anticipate SNF vs CIR   I spent a total of 35 minutes in both face-to-face and non-face-to-face activities, excluding procedures performed, for this visit on the date of this encounter.    LOS: 13 days    05/07/2023

## 2023-05-07 NOTE — Plan of Care (Signed)

## 2023-05-07 NOTE — Progress Notes (Signed)
Physical Therapy Treatment Patient Details Name: Louis Juarez MRN: [id number] DOB: Mar 31, 1941 Today's Date: 05/07/2023   History of Present Illness 82 y.o. male presents to [facility] on 04/24/2023 after fall from ladder. Pt found to have open book pelvic fx, open R olceranon fx, R rib 6-12 fx, R T10 TP fx, and age-indeterminate mild compression fxs of T4, T7 and T12. Pt underwent percutaneous fixation of pelvic fxs and open treatment of R olecranon fx on 10/4. PMH includes heart failure, ICD.    PT Comments  Patient resting in bed and eager to mobilize, pt expressing concerns regarding lack of progress and mobility, states he feels he is regressing. Pt required re-orientation to situation with unstable Rt pelvis fixation and plan for additional surgery. Discussed realistic healing time for bone healing and anticipated need for ongoing therapy after surgery. Pt required Max assist for bed mobility to move supine>sit EOB, limited by pain in sitting and returned to supine. +2 to boost superiorly and reposition. Will continue to progress pt as able and re-assess goals/function following next surgery.     If plan is discharge home, recommend the following: Two people to help with walking and/or transfers;Two people to help with bathing/dressing/bathroom;Assistance with cooking/housework;Assist for transportation;Help with stairs or ramp for entrance   Can travel by private vehicle        Equipment Recommendations  Wheelchair (measurements PT);Wheelchair cushion (measurements PT);Hospital bed;Hoyer lift    Recommendations for Other Services Rehab consult     Precautions / Restrictions Precautions Precautions: Fall Restrictions Weight Bearing Restrictions: Yes RUE Weight Bearing: Weight bearing as tolerated RLE Weight Bearing: Non weight bearing LLE Weight Bearing: Weight bearing as tolerated Other Position/Activity Restrictions: LLE WBAT for transfers only     Mobility  Bed  Mobility Overal bed mobility: Needs Assistance Bed Mobility: Rolling, Sidelying to Sit, Sit to Supine Rolling: Mod assist, Used rails Sidelying to sit: Max assist, HOB elevated, Used rails   Sit to supine: Max assist, HOB elevated   General bed mobility comments: Cues for sequencing use of bed rail and Max assist to pivot hpis/trunk with use of bed pad. Max Assist ot return to supine with controlling trunk and raising LE onto bed.    Transfers                        Ambulation/Gait                   Stairs             Wheelchair Mobility     Tilt Bed    Modified Rankin (Stroke Patients Only)       Balance Overall balance assessment: Needs assistance Sitting-balance support: Feet supported, Bilateral upper extremity supported Sitting balance-Leahy Scale: Poor Sitting balance - Comments: discomfort while seated on EOB, heavy realiance on UE support for seated balance                                    Cognition Arousal: Alert Behavior During Therapy: WFL for tasks assessed/performed Overall Cognitive Status: Impaired/Different from baseline                   Orientation Level: Disoriented to, Situation (slightly)   Memory: Decreased recall of precautions, Decreased short-term memory Following Commands: Follows one step commands inconsistently, Follows one step commands with increased time Safety/Judgement: Decreased awareness of deficits,  Decreased awareness of safety Awareness: Intellectual Problem Solving: Slow processing, Requires verbal cues, Requires tactile cues, Difficulty sequencing General Comments: Pt requires repeated reviewe of plan for surgery tomorrow and review of fall and injuries limiting his mobility.        Exercises      General Comments        Pertinent Vitals/Pain Pain Assessment Pain Assessment: 0-10 Pain Score: 4  Pain Location: pelvis Pain Descriptors / Indicators: Grimacing Pain  Intervention(s): Limited activity within patient's tolerance, Monitored during session, Repositioned, Patient requesting pain meds-RN notified    Home Living                          Prior Function            PT Goals (current goals can now be found in the care plan section) Acute Rehab PT Goals PT Goal Formulation: With patient Time For Goal Achievement: 05/09/23 Potential to Achieve Goals: Fair Progress towards PT goals: Progressing toward goals (slow)    Frequency    Min 1X/week      PT Plan      Co-evaluation              AM-PAC PT "6 Clicks" Mobility   Outcome Measure  Help needed turning from your back to your side while in a flat bed without using bedrails?: A Lot Help needed moving from lying on your back to sitting on the side of a flat bed without using bedrails?: Total Help needed moving to and from a bed to a chair (including a wheelchair)?: Total Help needed standing up from a chair using your arms (e.g., wheelchair or bedside chair)?: Total Help needed to walk in hospital room?: Total Help needed climbing 3-5 steps with a railing? : Total 6 Click Score: 7    End of Session   Activity Tolerance: Patient limited by pain Patient left: in bed;with call bell/phone within reach;with bed alarm set Nurse Communication: Mobility status;Need for lift equipment PT Visit Diagnosis: Other abnormalities of gait and mobility (R26.89);Muscle weakness (generalized) (M62.81);Pain Pain - Right/Left: Right Pain - part of body: Hip     Time: 5784-6962 PT Time Calculation (min) (ACUTE ONLY): 24 min  Charges:    $Therapeutic Activity: 23-37 mins PT General Charges $$ ACUTE PT VISIT: 1 Visit                     Wynn Maudlin, DPT Acute Rehabilitation Services Office [phone]  05/07/23 4:23 PM

## 2023-05-08 ENCOUNTER — Encounter (HOSPITAL_COMMUNITY): Admission: EM | Disposition: A | Discharge status: Rehabilitation Facility | Payer: Self-pay | Source: Home / Self Care

## 2023-05-08 ENCOUNTER — Inpatient Hospital Stay (HOSPITAL_COMMUNITY): Payer: BC Managed Care – PPO

## 2023-05-08 ENCOUNTER — Other Ambulatory Visit: Payer: Self-pay

## 2023-05-08 ENCOUNTER — Inpatient Hospital Stay (HOSPITAL_COMMUNITY): Payer: BC Managed Care – PPO | Admitting: Certified Registered Nurse Anesthetist

## 2023-05-08 HISTORY — PX: IRRIGATION AND DEBRIDEMENT ELBOW: SHX6886

## 2023-05-08 HISTORY — PX: ORIF PELVIC FRACTURE: SHX2128

## 2023-05-08 LAB — CBC
HCT: 30 % — ABNORMAL LOW (ref 39.0–52.0)
Hemoglobin: 9.5 g/dL — ABNORMAL LOW (ref 13.0–17.0)
MCH: 30.9 pg (ref 26.0–34.0)
MCHC: 31.7 g/dL (ref 30.0–36.0)
MCV: 97.7 fL (ref 80.0–100.0)
Platelets: 614 10*3/uL — ABNORMAL HIGH (ref 150–400)
RBC: 3.07 MIL/uL — ABNORMAL LOW (ref 4.22–5.81)
RDW: 17.7 % — ABNORMAL HIGH (ref 11.5–15.5)
WBC: 25.7 10*3/uL — ABNORMAL HIGH (ref 4.0–10.5)
nRBC: 0.2 % (ref 0.0–0.2)

## 2023-05-08 LAB — GLUCOSE, CAPILLARY: Glucose-Capillary: 176 mg/dL — ABNORMAL HIGH (ref 70–99)

## 2023-05-08 LAB — RENAL FUNCTION PANEL
Albumin: 2.6 g/dL — ABNORMAL LOW (ref 3.5–5.0)
Anion gap: 12 (ref 5–15)
BUN: 12 mg/dL (ref 8–23)
CO2: 22 mmol/L (ref 22–32)
Calcium: 8.8 mg/dL — ABNORMAL LOW (ref 8.9–10.3)
Chloride: 100 mmol/L (ref 98–111)
Creatinine, Ser: 0.7 mg/dL (ref 0.61–1.24)
GFR, Estimated: >60 mL/min (ref >60)
Glucose, Bld: 188 mg/dL — ABNORMAL HIGH (ref 70–99)
Phosphorus: 3.5 mg/dL (ref 2.5–4.6)
Potassium: 4.7 mmol/L (ref 3.5–5.1)
Sodium: 134 mmol/L — ABNORMAL LOW (ref 135–145)

## 2023-05-08 LAB — PREPARE RBC (CROSSMATCH)

## 2023-05-08 LAB — MAGNESIUM: Magnesium: 1.9 mg/dL (ref 1.7–2.4)

## 2023-05-08 SURGERY — OPEN REDUCTION INTERNAL FIXATION (ORIF) PELVIC FRACTURE
Anesthesia: General | Site: Elbow | Laterality: Right

## 2023-05-08 MED ORDER — ALBUMIN HUMAN 5 % IV SOLN
INTRAVENOUS | Status: DC | PRN
Start: 2023-05-08 — End: 2023-05-08

## 2023-05-08 MED ORDER — SODIUM CHLORIDE 0.9 % IR SOLN
Status: DC | PRN
Start: 2023-05-08 — End: 2023-05-08
  Administered 2023-05-08: 1000 mL

## 2023-05-08 MED ORDER — CHLORHEXIDINE GLUCONATE 0.12 % MT SOLN
OROMUCOSAL | Status: AC
  Filled 2023-05-08: qty 15

## 2023-05-08 MED ORDER — PROPOFOL 10 MG/ML IV BOLUS
INTRAVENOUS | Status: DC | PRN
  Administered 2023-05-08: 130 mg via INTRAVENOUS

## 2023-05-08 MED ORDER — VANCOMYCIN HCL 1000 MG IV SOLR
INTRAVENOUS | Status: DC | PRN
  Administered 2023-05-08 (×2): 1000 mg

## 2023-05-08 MED ORDER — HYDROMORPHONE HCL 1 MG/ML IJ SOLN
INTRAMUSCULAR | Status: AC
  Filled 2023-05-08: qty 0.5

## 2023-05-08 MED ORDER — ONDANSETRON HCL 4 MG/2ML IJ SOLN: 4.0000 mg | Freq: Once | INTRAMUSCULAR | Status: DC | PRN

## 2023-05-08 MED ORDER — FENTANYL CITRATE (PF) 250 MCG/5ML IJ SOLN
INTRAMUSCULAR | Status: DC | PRN
  Administered 2023-05-08: 50 ug via INTRAVENOUS
  Administered 2023-05-08: 100 ug via INTRAVENOUS
  Administered 2023-05-08 (×2): 50 ug via INTRAVENOUS

## 2023-05-08 MED ORDER — VANCOMYCIN HCL 1000 MG IV SOLR
INTRAVENOUS | Status: AC
  Filled 2023-05-08: qty 20

## 2023-05-08 MED ORDER — SODIUM CHLORIDE 0.9% IV SOLUTION: Freq: Once | INTRAVENOUS | Status: DC

## 2023-05-08 MED ORDER — DEXAMETHASONE SODIUM PHOSPHATE 10 MG/ML IJ SOLN
INTRAMUSCULAR | Status: DC | PRN
  Administered 2023-05-08: 4 mg via INTRAVENOUS

## 2023-05-08 MED ORDER — LACTATED RINGERS IV SOLN: INTRAVENOUS | Status: DC

## 2023-05-08 MED ORDER — CHLORHEXIDINE GLUCONATE 0.12 % MT SOLN
15.0000 mL | Freq: Once | OROMUCOSAL | Status: AC
  Administered 2023-05-08: 15 mL via OROMUCOSAL

## 2023-05-08 MED ORDER — FENTANYL CITRATE (PF) 250 MCG/5ML IJ SOLN
INTRAMUSCULAR | Status: AC
  Filled 2023-05-08: qty 5

## 2023-05-08 MED ORDER — SUGAMMADEX SODIUM 200 MG/2ML IV SOLN
INTRAVENOUS | Status: DC | PRN
  Administered 2023-05-08: 200 mg via INTRAVENOUS

## 2023-05-08 MED ORDER — HYDROMORPHONE HCL 1 MG/ML IJ SOLN: 0.2500 mg | INTRAMUSCULAR | Status: DC | PRN

## 2023-05-08 MED ORDER — ONDANSETRON HCL 4 MG/2ML IJ SOLN
INTRAMUSCULAR | Status: DC | PRN
  Administered 2023-05-08: 4 mg via INTRAVENOUS

## 2023-05-08 MED ORDER — 0.9 % SODIUM CHLORIDE (POUR BTL) OPTIME
TOPICAL | Status: DC | PRN
  Administered 2023-05-08: 1000 mL

## 2023-05-08 MED ORDER — AMISULPRIDE (ANTIEMETIC) 5 MG/2ML IV SOLN: 10.0000 mg | Freq: Once | INTRAVENOUS | Status: DC | PRN

## 2023-05-08 MED ORDER — ROCURONIUM BROMIDE 10 MG/ML (PF) SYRINGE
PREFILLED_SYRINGE | INTRAVENOUS | Status: DC | PRN
  Administered 2023-05-08: 40 mg via INTRAVENOUS
  Administered 2023-05-08 (×2): 60 mg via INTRAVENOUS

## 2023-05-08 MED ORDER — PROPOFOL 10 MG/ML IV BOLUS
INTRAVENOUS | Status: AC
  Filled 2023-05-08: qty 20

## 2023-05-08 MED ORDER — ORAL CARE MOUTH RINSE: 15.0000 mL | Freq: Once | OROMUCOSAL | Status: AC

## 2023-05-08 SURGICAL SUPPLY — 79 items
ADH SKN CLS APL DERMABOND .7 (GAUZE/BANDAGES/DRESSINGS) ×4
APL PRP STRL LF DISP 70% ISPRP (MISCELLANEOUS) ×4
APPLIER CLIP 9.375 SM OPEN (CLIP) ×2
APR CLP SM 9.3 20 MLT OPN (CLIP) ×2
BAG COUNTER SPONGE SURGICOUNT (BAG) ×2 IMPLANT
BAG SPNG CNTER NS LX DISP (BAG) ×2
BIT DRILL FLUTED 2.5 (BIT) IMPLANT
BLADE CLIPPER SURG (BLADE) IMPLANT
BNDG CMPR 5X4 KNIT ELC UNQ LF (GAUZE/BANDAGES/DRESSINGS) ×2
BNDG ELASTIC 4INX 5YD STR LF (GAUZE/BANDAGES/DRESSINGS) IMPLANT
CHLORAPREP W/TINT 26 (MISCELLANEOUS) ×2 IMPLANT
CLIP APPLIE 9.375 SM OPEN (CLIP) IMPLANT
DERMABOND ADVANCED .7 DNX12 (GAUZE/BANDAGES/DRESSINGS) ×4 IMPLANT
DRAIN CHANNEL 15F RND FF W/TCR (WOUND CARE) IMPLANT
DRAPE C-ARM 42X72 X-RAY (DRAPES) ×2 IMPLANT
DRAPE C-ARMOR (DRAPES) ×2 IMPLANT
DRAPE HALF SHEET 40X57 (DRAPES) ×4 IMPLANT
DRAPE INCISE IOBAN 66X45 STRL (DRAPES) ×4 IMPLANT
DRAPE SURG 17X23 STRL (DRAPES) ×12 IMPLANT
DRAPE U-SHAPE 47X51 STRL (DRAPES) ×2 IMPLANT
DRESSING MEPILEX FLEX 4X4 (GAUZE/BANDAGES/DRESSINGS) ×4 IMPLANT
DRILL BIT FLUTED 2.5 (BIT) ×2
DRSG EMULSION OIL 3X3 NADH (GAUZE/BANDAGES/DRESSINGS) IMPLANT
DRSG MEPILEX FLEX 4X4 (GAUZE/BANDAGES/DRESSINGS)
DRSG MEPILEX POST OP 4X8 (GAUZE/BANDAGES/DRESSINGS) ×4 IMPLANT
ELECT REM PT RETURN 9FT ADLT (ELECTROSURGICAL) ×2
ELECTRODE REM PT RTRN 9FT ADLT (ELECTROSURGICAL) ×2 IMPLANT
EVACUATOR SILICONE 100CC (DRAIN) IMPLANT
GAUZE SPONGE 4X4 12PLY STRL (GAUZE/BANDAGES/DRESSINGS) IMPLANT
GLOVE BIO SURGEON STRL SZ 6.5 (GLOVE) ×6 IMPLANT
GLOVE BIO SURGEON STRL SZ7.5 (GLOVE) ×8 IMPLANT
GLOVE BIOGEL PI IND STRL 6.5 (GLOVE) ×2 IMPLANT
GLOVE BIOGEL PI IND STRL 7.5 (GLOVE) ×2 IMPLANT
GOWN STRL REUS W/ TWL LRG LVL3 (GOWN DISPOSABLE) ×4 IMPLANT
GOWN STRL REUS W/TWL LRG LVL3 (GOWN DISPOSABLE) ×4
HANDPIECE INTERPULSE COAX TIP (DISPOSABLE) ×2
KIT BASIN OR (CUSTOM PROCEDURE TRAY) ×2 IMPLANT
KIT TURNOVER KIT B (KITS) ×2 IMPLANT
MANIFOLD NEPTUNE II (INSTRUMENTS) ×2 IMPLANT
NS IRRIG 1000ML POUR BTL (IV SOLUTION) ×4 IMPLANT
PACK TOTAL JOINT (CUSTOM PROCEDURE TRAY) ×2 IMPLANT
PACK UNIVERSAL I (CUSTOM PROCEDURE TRAY) ×2 IMPLANT
PAD ABD 8X10 STRL (GAUZE/BANDAGES/DRESSINGS) IMPLANT
PAD ARMBOARD 7.5X6 YLW CONV (MISCELLANEOUS) ×4 IMPLANT
PAD CAST 4YDX4 CTTN HI CHSV (CAST SUPPLIES) IMPLANT
PADDING CAST COTTON 4X4 STRL (CAST SUPPLIES) ×4
PLATE CRVD RECON LP 3.5X108 8H (Plate) IMPLANT
PLATE LOCK RECON 3.5X39 3H (Screw) IMPLANT
PLATE PELVIC 4H 3.5 (Plate) IMPLANT
SCREW CORTEX 3.5X40MM (Screw) IMPLANT
SCREW CORTEX 3.5X45MM (Screw) IMPLANT
SCREW LOCK CORT ST 3.5X26 (Screw) IMPLANT
SCREW LOCK CORT ST 3.5X28 (Screw) IMPLANT
SCREW LOCK CORT ST 3.5X30 (Screw) IMPLANT
SCREW LOCK CORT ST 3.5X32 (Screw) IMPLANT
SCREW LOCK CORT ST 3.5X34 (Screw) IMPLANT
SCREW LOCK CORT ST 3.5X36 (Screw) IMPLANT
SCREW PELVIC CORT ST 3.5X50 (Screw) IMPLANT
SCREW PELVIC CORT ST 3.5X55 (Screw) IMPLANT
SCREW PELVIC CORT ST 3.5X60 (Screw) IMPLANT
SCREW PELVIC CORT ST 3.5X65 (Screw) IMPLANT
SET HNDPC FAN SPRY TIP SCT (DISPOSABLE) IMPLANT
SPONGE T-LAP 18X18 ~~LOC~~+RFID (SPONGE) IMPLANT
STAPLER VISISTAT 35W (STAPLE) ×2 IMPLANT
SUCTION TUBE FRAZIER 10FR DISP (SUCTIONS) ×2 IMPLANT
SUT ETHILON 2 0 PSLX (SUTURE) IMPLANT
SUT MNCRL AB 3-0 PS2 18 (SUTURE) ×2 IMPLANT
SUT MNCRL+ AB 3-0 CT1 36 (SUTURE) IMPLANT
SUT MON AB 2-0 CT1 36 (SUTURE) ×2 IMPLANT
SUT VIC AB 0 CT1 27 (SUTURE) ×4
SUT VIC AB 0 CT1 27XBRD ANBCTR (SUTURE) ×4 IMPLANT
SUT VIC AB 1 CT1 18XCR BRD 8 (SUTURE) IMPLANT
SUT VIC AB 1 CT1 8-18 (SUTURE)
SUT VIC AB 2-0 CT1 27 (SUTURE)
SUT VIC AB 2-0 CT1 TAPERPNT 27 (SUTURE) ×4 IMPLANT
TOWEL GREEN STERILE (TOWEL DISPOSABLE) ×4 IMPLANT
TOWEL GREEN STERILE FF (TOWEL DISPOSABLE) ×2 IMPLANT
TRAY FOLEY MTR SLVR 16FR STAT (SET/KITS/TRAYS/PACK) IMPLANT
WATER STERILE IRR 1000ML POUR (IV SOLUTION) ×8 IMPLANT

## 2023-05-08 NOTE — Anesthesia Procedure Notes (Signed)
Procedure Name: Intubation Date/Time: 05/08/2023 9:16 AM  Performed by: Sandie Ano, CRNAPre-anesthesia Checklist: Patient identified, Emergency Drugs available, Suction available and Patient being monitored Patient Re-evaluated:Patient Re-evaluated prior to induction Oxygen Delivery Method: Circle System Utilized Preoxygenation: Pre-oxygenation with 100% oxygen Induction Type: IV induction Ventilation: Mask ventilation without difficulty Laryngoscope Size: Glidescope Grade View: Grade I Tube type: Oral Tube size: 7.0 mm Number of attempts: 1 Airway Equipment and Method: Stylet and Oral airway Placement Confirmation: ETT inserted through vocal cords under direct vision, positive ETCO2 and breath sounds checked- equal and bilateral Secured at: 21 cm Tube secured with: Tape Dental Injury: Teeth and Oropharynx as per pre-operative assessment

## 2023-05-08 NOTE — Op Note (Signed)
Orthopaedic Surgery Operative Note (CSN: [id number] ) Date of Surgery: 05/08/2023  Admit Date: 04/24/2023   Diagnoses: Pre-Op Diagnoses: Complex LC3 pelvic ring injury with loss of fixation Right elbow infection  Post-Op Diagnosis: Same  Procedures: CPT 27217-Open reduction internal fixation of pubic symphysis and right superior pubic ramus CPT 27218-Open reduction internal fixation of right posterior pelvis/SI joint CPT 10180-Irrigation and debridement of right elbow infection  Surgeons : Primary: Roby Lofts, MD  Assistant: Darron Doom, RNFA  Location: OR 3   Anesthesia: General   Antibiotics: Scheduled Zosyn, 1 gm vancomycin powder placed in pelvic incisions and 1 gm vancomycin powder placed in right elbow   Tourniquet time: None    Estimated Blood Loss: 650 mL  Complications:* No complications entered in OR log *   Specimens:* No specimens in log *   Implants: Implant Name Type Inv. Item Serial No. Manufacturer Lot No. LRB No. Used Action  SCREW CORTEX 3.5X45MM - [id number] Screw SCREW CORTEX 3.5X45MM  [employer]   2 Implanted  SCREW PELVIC CORT ST 3.5X55 - [id number] Screw SCREW PELVIC CORT ST 3.5X55  [employer]   2 Implanted  SCREW LOCK CORT ST 3.5X26 - [id number] Screw SCREW LOCK CORT ST 3.5X26  [employer]   1 Implanted  SCREW CORTEX 3.5X40MM - [id number] Screw SCREW CORTEX 3.5X40MM  [employer]   1 Implanted  SCREW LOCK CORT ST 3.5X28 - [id number] Screw SCREW LOCK CORT ST 3.5X28  [employer]   1 Implanted  SCREW LOCK CORT ST 3.5X30 - [id number] Screw SCREW LOCK CORT ST 3.5X30  [employer]   1 Implanted  SCREW LOCK CORT ST 3.5X32 - [id number] Screw SCREW LOCK CORT ST 3.5X32  [employer]   1 Implanted  SCREW LOCK CORT ST 3.5X34 - [id number] Screw SCREW LOCK CORT ST 3.5X34  [employer]   1 Implanted  PLATE CRVD RECON LP 3.5X108 8H - [id number] Plate PLATE CRVD RECON LP 3.5X108 8H  [employer]   1  Implanted  PLATE PELVIC 4H 3.5 - [id number] Plate PLATE PELVIC 4H 3.5  [employer]  Right 1 Implanted  SCREW LOCK CORT ST 3.5X36 - [id number] Screw SCREW LOCK CORT ST 3.5X36  [employer]  Right 1 Implanted  PLATE LOCK RECON 3.5X39 3H - [id number] Screw PLATE LOCK RECON 3.5X39 3H  [employer]  Right 1 Implanted  SCREW PELVIC CORT ST 3.5X50 - [id number] Screw SCREW PELVIC CORT ST 3.5X50  [employer]   1 Implanted  SCREW PELVIC CORT ST 3.5X60 - [id number] Screw SCREW PELVIC CORT ST 3.5X60  [employer]   1 Implanted     Indications for Surgery: 82 year old male who fell from a height sustaining a complex lateral compression pelvic ring injury with the multiple fractures.  He also had a highly contaminated right elbow laceration that had a portion of the olecranon that was avulsed.  He was taken initially on 04/24/2023 for percutaneous fixation of his pelvis.  He also had I&D and closure of his right elbow wound.  He subsequently developed persistent pain in his pelvis with instability.  CT scan was performed which showed loss of fixation of his posterior fixation as well as widening of his pubic symphysis.  He also started to have drainage from his right elbow.  With all these issues combined I felt that proceeding with open reduction internal fixation of his pelvis would be most appropriate to stabilize his pelvis to prevent a potential nonunion and allow him for easier mobilization.  I  discussed open reduction of his symphysis as well as open reduction of his posterior pelvis.  I also discussed of I&D of the right elbow to clear out the infection.  Risks and benefits were discussed with the patient and his wife.  Risks included but not limited to bleeding, infection, malunion, nonunion, hardware failure, hardware irritation, nerve and blood vessel injury, DVT, even the possibility anesthetic complications.  They agreed to proceed with surgery and consent was  obtained.  Operative Findings: 1.  Open reduction internal fixation of pubic symphysis and right sided superior pubic ramus using 8 hole Synthes pelvic recon plate 2.  Open reduction internal fixation of right posterior pelvis and SI joint using 4 hole and 3 hole pelvic recon plates 3.  Irrigation and debridement of right elbow infection with primary closure using 2-0 nylon.  Procedure: The patient was identified in the preoperative holding area. Consent was confirmed with the patient and their family and all questions were answered. The operative extremity was marked after confirmation with the patient. he was then brought back to the operating room by our anesthesia colleagues.  He was placed under general anesthetic and carefully transferred over to radiolucent flattop table.  A bump was placed under his sacrum to elevate his pelvis.  The pelvis was then prepped and draped in usual sterile fashion.  A timeout was performed to verify the patient, the procedure, and the extremity.  Preoperative antibiotics were dosed.  Fluoroscopic imaging showed the unstable nature of his pelvis.  There is gross instability of his right hemipelvis.  I started out by making a standard Pfannenstiel incision carried it down through skin and subcutaneous tissue.  Identified the center of the rectus muscle and split this vertically in line with my incision.  I then entered the retropubic space.  I used a malleable retractor to protect the bladder throughout the case.  I then carefully dissected the rectus off of the anterior portion of the left sided superior pubic ramus and continue my dissection along the superior pubic ramus until I reached the left-sided pubic tubercle.  This process was repeated on the left side I was able to visualize the fracture and continue my dissection along the pubic tubercle until I reached the corona mortise.  I clipped this with a vessel clips and incised through this and took care to continue  to protect the bladder.  Once I had exposure of the anterior pelvis I then used a reduction tenaculum with 1 time not the pubic tubercle and the other tine at the other pubic tubercle and I was able to manipulate the pelvis and reduce the pubic symphysis into an anatomic position.  I then contoured a 8 hole Synthes pelvic recon plate with 3 holes on the left superior pubic ramus and 5 holes on the right.  I then drilled and placed a nonlocking screws in the pelvis on both sides of the symphysis.  I was able to get 4 screws on the right side and 3 screws on the left.  Unfortunately his bone quality is not that great but I got decent purchase with the screws.  I kept the clamp in place while I focused on the posterior pelvis.  I made a standard lateral window carried it down through skin and subcutaneous tissue.  I elevated up the external bleak fascia to visualize the iliac crest.  I performed subperiosteal dissection until I reached the inner table of the pelvis.  I did have to incise some of  the external bleak muscle in the posterior aspect to gain access to the posterior aspect of the pelvis.  I then used a Cobb elevator to carefully dissect along the inner table until I reached the left-sided SI joint.  There is still some mild reduction of the inferior SI joint but felt that the reduction was adequate enough on fluoroscopic imaging that I could proceed with fixation.  I took care to retract the soft tissues medial to the SI joint including the potential for the L5 nerve root.  I then was able to contour a 4 hole pelvic recon plate and held it with provisionally with a K wire.  I confirmed positioning the plate with fluoroscopy and then I drilled and placed nonlocking screws lateral and medial to the SI joint.  I then repeated the process with a 3 hole pelvic recon plate and the inferior portion of the SI joint.  I did not place a screw in the most medial hole as I did not feel that it was getting good  purchase and I was worried about potential iatrogenic injury to the L5 nerve root.  Final fluoroscopic imaging was obtained.  The architecture of the pelvis was much improved.  Stress examination with lateral compression showed minimal movement of the pelvis.  I then thoroughly irrigated the wounds and placed a gram of vancomycin powder between the 2 incisions.  A layered closure of 0 Vicryl, 2-0 Monocryl and 3-0 Monocryl with Dermabond was used to close the skin.  Sterile dressings were applied.  I then turned my attention to the right upper extremity.  The right upper extremity was then prepped and draped in usual sterile fashion.  A timeout was performed to verify the patient, the procedure, and the extremity.  No antibiotics were dosed.  I reopened the incision and encountered significant fibrinous and purulent material.  I debrided this.  I did not take cultures as he has been on antibiotics for nearly 5 days.  I cleaned out all of the fibrinous and purulent material until it is back to healthy bleeding tissue.  I then used a Cobb elevator to help clean this out as well.  I then used low-pressure pulsatile lavage to thoroughly irrigate the wound with approximately 1500 mL of saline.  I then placed a gram of vancomycin powder and loosely approximated the skin with 2-0 nylon.  Sterile dressings were applied to the right upper extremity.  The patient was then awoken from anesthesia and taken to the PACU in stable condition.   Debridement type: Excisional Debridement  Side: right  Body Location: Elbow  Tools used for debridement: scalpel and curette  Pre-debridement Wound size (cm):   N/A-closed  Post-debridement Wound size (cm):   N/A-closed  Debridement depth beyond dead/damaged tissue down to healthy viable tissue: yes  Tissue layer involved: skin, subcutaneous tissue, muscle / fascia  Nature of tissue removed: Necrotic and Purulence  Irrigation volume: 1.5L     Irrigation fluid type:  Normal Saline   Post Op Plan/Instructions: Patient will be weightbearing on the left lower extremity only for transfers.  He will be strict nonweightbearing on the right lower extremity.  He will be weightbearing as tolerated to the right upper extremity.  Continue with a Zosyn for another 72 hours and we will monitor his wound likely on Monday.  I will order a postoperative x-ray and CT scan to assess for hardware and reduction of the pelvis.  We will plan to have him mobilize with  physical and Occupational Therapy.  I was present and performed the entire surgery.  Truitt Merle, MD [facility]

## 2023-05-08 NOTE — Progress Notes (Signed)
CCC Pre-op Review  Pre-op checklist:     NPO:   Labs:  crossmatch noted       10/18 labs not drawn time of note  10/17  WBC 21 h/h 8.9/27.8  Plts 658*  Na 133 Glu 144  Alb 2.2*  bun/cr 21/0.79  Consent:  TBD  H&P:  TBD  Vitals:    afeb  p73 r 23 150/68 (93)  O2 requirements:   RA   sats +/- 90-93  MAR/PTA review:   lovenox last pm, Zosyn this am & q8h  IV:   22g LFA  Floor nurse name:   Erasmo Downer RN  Additional info:   Mult fxs , including rib fxs

## 2023-05-08 NOTE — Progress Notes (Signed)
Patient ID: KIYANSH HORNECKER, male   DOB: 1941/06/19, 82 y.o.   MRN: [id number] 14 Days Post-Op    Subjective: About to go down to pre-op holding C/O being NPO ROS negative except as listed above. Objective: Vital signs in last 24 hours: Temp:  [97.6 F (36.4 C)-98.5 F (36.9 C)] 97.8 F (36.6 C) (10/18 0314) Pulse Rate:  [74-91] 91 (10/18 0314) Resp:  [18-29] 22 (10/18 0314) BP: (142-162)/(61-90) 159/90 (10/18 0314) SpO2:  [92 %-95 %] 92 % (10/18 0314) Weight:  [87.8 kg] 87.8 kg (10/18 0500) Last BM Date : 05/08/23  Intake/Output from previous day: 10/17 0701 - 10/18 0700 In: 864.6 [P.O.:600; IV Piggyback:264.6] Out: 1725 [Urine:1725] Intake/Output this shift: No intake/output data recorded.  General appearance: alert and cooperative Resp: clear to auscultation bilaterally GI: soft, NT Extremities: dressing R elbow Neuro: A&O  Lab Results: CBC  Recent Labs    05/06/23 0614 05/07/23 0635  WBC 22.4* 21.1*  HGB 9.3* 8.9*  HCT 28.7* 27.8*  PLT 674* 658*   BMET Recent Labs    05/06/23 0617 05/07/23 0635  NA 133* 131*  K 4.6 4.4  CL 101 99  CO2 22 24  GLUCOSE 144* 149*  BUN 21 16  CREATININE 0.79 0.76  CALCIUM 8.7* 8.5*   PT/INR No results for input(s): "LABPROT", "INR" in the last 72 hours. ABG No results for input(s): "PHART", "HCO3" in the last 72 hours.  Invalid input(s): "PCO2", "PO2"  Studies/Results: DG CHEST PORT 1 VIEW  Result Date: 05/07/2023 CLINICAL DATA:  Hypoxia.  History of CHF. EXAM: PORTABLE CHEST 1 VIEW COMPARISON:  Radiograph 05/05/2023. CT 04/24/2023 FINDINGS: Left-sided pacemaker in place. Mild cardiomegaly with stable mediastinal contours. Aortic atherosclerosis. Right pleural effusion may have worsened. Background peribronchial thickening. No developing airspace disease. No pneumothorax. Bilateral rib fractures, acute on the right, remote on the left. IMPRESSION: 1. Right pleural effusion may have worsened. 2. Mild cardiomegaly. 3.  Background peribronchial thickening. Electronically Signed   By: Narda Rutherford M.D.   On: 05/07/2023 10:15    Anti-infectives: Anti-infectives (From admission, onward)    Start     Dose/Rate Route Frequency Ordered Stop   05/03/23 1100  piperacillin-tazobactam (ZOSYN) IVPB 3.375 g        3.375 g 12.5 mL/hr over 240 Minutes Intravenous Every 8 hours 05/03/23 1010     04/24/23 2300  ceFAZolin (ANCEF) IVPB 2g/100 mL premix        2 g 200 mL/hr over 30 Minutes Intravenous Every 8 hours 04/24/23 1900 04/25/23 1557   04/24/23 1714  vancomycin (VANCOCIN) powder  Status:  Discontinued          As needed 04/24/23 1715 04/24/23 1749   04/24/23 1300  metroNIDAZOLE (FLAGYL) IVPB 500 mg        500 mg 100 mL/hr over 60 Minutes Intravenous  Once 04/24/23 1257 04/24/23 1459   04/24/23 1215  ceFAZolin (ANCEF) IVPB 2g/100 mL premix        2 g 200 mL/hr over 30 Minutes Intravenous  Once 04/24/23 1202 04/24/23 1258       Assessment/Plan: Fall from ladder, 20 ft   Hemmorrhagic shock - resolved Pelvic fractures (R iliac bone, R pubic rami, and right and left sacrum with 10cm diastasis of pubic symphysis) -  Foley and binder placed in trauma bay. OR ortho 10/4 perc fixation of numerous pelvic fxs, ORIF right elbow. CT over weekend showed increased displacement of right iliac wing FX. - NWB RLE and WB  LLE for transfers only, further imaging later this week. - OR today per Dr. Jena Gauss Open R elbow/olecranon fx - Per Ortho --> I&D 10/4, infection noted 10/14 and Dr Jena Gauss removed a couple of sutures. Washout in OR today by Dr. Jena Gauss.  - Continue IV Zosyn R 6-12th rib fxs - multimodal pain control, pulm toilet, some pul Right pleural effusion - improving on today's CXR, pulmonary edema - Will give 40 of lasix R T10 TP fxs - multimodal pain control.  Age-indeterminate mild compression fractures of T4, T7 and T12 - discussed with NSGY Dr. Jordan Likes who reviewed images. Does not need OR or brace/spine  precautions. Incidental findings - Pulmonary nodules and Severe atherosclerotic calcifications of the left carotid bulb and proximal left cervical ICA. 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 BP has been stable SBP 140s-150s, will follow after lasix today and if still high will start to reintroduce home antiHTNs FEN: Heart healthy diet, SLIV. +BM  ID: Tdap in ED, Ancef in ED for open fracture; Zosyn started 10/13 due to colitis noted on CT and possible RLL pneumonia. Purulent D/C from R elbow - washout 10/18 by Dr. Jena Gauss               VTE: SCDs, LMWH Foley - replaced Dispo: 4NP, OR with Dr. Jena Gauss, PT/OT, anticipate SNF vs CIR  LOS: 14 days    Violeta Gelinas, MD, MPH, FACS Trauma & General Surgery Use [email or web address] to contact on call provider  05/08/2023

## 2023-05-08 NOTE — Progress Notes (Signed)
Received a call from tele stating the patient heart rate jumped to 125 with his heart rhythm flipping complexes for a minute and then it went back into normal rhythm. He is asymptomatic. Notified MD.

## 2023-05-08 NOTE — Interval H&P Note (Signed)
History and Physical Interval Note:  05/08/2023 8:20 AM  Louis Juarez  has presented today for surgery, with the diagnosis of Pelvic fracture revision.  The various methods of treatment have been discussed with the patient and family. After consideration of risks, benefits and other options for treatment, the patient has consented to  Procedure(s): OPEN REDUCTION INTERNAL FIXATION (ORIF) PELVIC FRACTURE (Right) as a surgical intervention.  The patient's history has been reviewed, patient examined, no change in status, stable for surgery.  I have reviewed the patient's chart and labs.  Questions were answered to the patient's satisfaction.     Caryn Bee P Lisle Skillman

## 2023-05-08 NOTE — Anesthesia Postprocedure Evaluation (Signed)
Anesthesia Post Note  Patient: Louis Juarez  Procedure(s) Performed: OPEN REDUCTION INTERNAL FIXATION (ORIF) PELVIC FRACTURE (Right) IRRIGATION AND DEBRIDEMENT ELBOW (Right: Elbow)     Patient location during evaluation: PACU Anesthesia Type: General Level of consciousness: awake and alert and oriented Pain management: pain level controlled Vital Signs Assessment: post-procedure vital signs reviewed and stable Respiratory status: spontaneous breathing, nonlabored ventilation, respiratory function stable and patient connected to nasal cannula oxygen Cardiovascular status: blood pressure returned to baseline and stable Postop Assessment: no apparent nausea or vomiting Anesthetic complications: no   No notable events documented.  Last Vitals:  Vitals:   05/08/23 1230 05/08/23 1301  BP: (!) 171/75 (!) 167/75  Pulse: 85 84  Resp: (!) 22 (!) 25  Temp: 36.4 C 36.4 C  SpO2: 98% 94%    Last Pain:  Vitals:   05/08/23 1301  TempSrc: Oral  PainSc:                  Batya Citron A.

## 2023-05-08 NOTE — Progress Notes (Signed)
Inpatient Rehabilitation Admissions Coordinator   Noted OR today. We will follow up on Monday with his therapy progress and clarification of his weight bearing status to assist in determining most appropriate rehab venue.  Ottie Glazier, RN, MSN Rehab Admissions Coordinator [phone] 05/08/2023 1:54 PM

## 2023-05-08 NOTE — Plan of Care (Signed)
CHL Tonsillectomy/Adenoidectomy, Postoperative PEDS care plan entered in error.

## 2023-05-08 NOTE — Transfer of Care (Signed)
Immediate Anesthesia Transfer of Care Note  Patient: Louis Juarez  Procedure(s) Performed: OPEN REDUCTION INTERNAL FIXATION (ORIF) PELVIC FRACTURE (Right) IRRIGATION AND DEBRIDEMENT ELBOW (Right: Elbow)  Patient Location: PACU  Anesthesia Type:General  Level of Consciousness: awake and alert   Airway & Oxygen Therapy: Patient Spontanous Breathing  Post-op Assessment: Report given to RN and Post -op Vital signs reviewed and stable  Post vital signs: Reviewed and stable  Last Vitals:  Vitals Value Taken Time  BP 167/75 05/08/23 1301  Temp 36.4 C 05/08/23 1301  Pulse 87 05/08/23 1341  Resp 19 05/08/23 1341  SpO2 93 % 05/08/23 1341  Vitals shown include unfiled device data.  Last Pain:  Vitals:   05/08/23 1312  TempSrc:   PainSc: 8       Patients Stated Pain Goal: 2 (05/07/23 1623)  Complications: No notable events documented.

## 2023-05-08 NOTE — Anesthesia Preprocedure Evaluation (Addendum)
Anesthesia Evaluation  Patient identified by MRN, date of birth, ID band Patient awake    Reviewed: Allergy & Precautions, NPO status , Patient's Chart, lab work & pertinent test results, reviewed documented beta blocker date and time   Airway Mallampati: II  TM Distance: >3 FB Neck ROM: Limited    Dental  (+) Missing, Dental Advisory Given   Pulmonary shortness of breath and with exertion, pneumonia, former smoker    + decreased breath sounds      Cardiovascular hypertension, Pt. on home beta blockers and Pt. on medications +CHF  (-) dysrhythmias + Cardiac Defibrillator  Rhythm:Regular Rate:Normal + Carotid Bruit BiVICD NICM EKG - LBBB pattern Recent pulmonary edema   Neuro/Psych   Anxiety      C-spine not cleared    GI/Hepatic Neg liver ROS,GERD  Medicated and Controlled,,  Endo/Other  diabetes, Type 2  Hyperlipidemia  Renal/GU Renal disease  negative genitourinary   Musculoskeletal Pelvic fracture- ORIF    Abdominal   Peds  Hematology  (+) Blood dyscrasia, anemia INR: 1.3   Anesthesia Other Findings   Reproductive/Obstetrics                              Anesthesia Physical Anesthesia Plan  ASA: 4  Anesthesia Plan: General   Post-op Pain Management:    Induction: Intravenous  PONV Risk Score and Plan: 2 and Ondansetron, Dexamethasone and Treatment may vary due to age or medical condition  Airway Management Planned: Oral ETT and Video Laryngoscope Planned  Additional Equipment: None  Intra-op Plan:   Post-operative Plan: Possible Post-op intubation/ventilation and Extubation in OR  Informed Consent: I have reviewed the patients History and Physical, chart, labs and discussed the procedure including the risks, benefits and alternatives for the proposed anesthesia with the patient or authorized representative who has indicated his/her understanding and acceptance.      Dental advisory given  Plan Discussed with: CRNA and Anesthesiologist  Anesthesia Plan Comments: (Potential central line placement discussed Potential arterial line placement discussed )         Anesthesia Quick Evaluation

## 2023-05-09 LAB — RENAL FUNCTION PANEL
Albumin: 2.4 g/dL — ABNORMAL LOW (ref 3.5–5.0)
Anion gap: 8 (ref 5–15)
BUN: 15 mg/dL (ref 8–23)
CO2: 23 mmol/L (ref 22–32)
Calcium: 8.5 mg/dL — ABNORMAL LOW (ref 8.9–10.3)
Chloride: 102 mmol/L (ref 98–111)
Creatinine, Ser: 0.7 mg/dL (ref 0.61–1.24)
GFR, Estimated: >60 mL/min (ref >60)
Glucose, Bld: 162 mg/dL — ABNORMAL HIGH (ref 70–99)
Phosphorus: 2.7 mg/dL (ref 2.5–4.6)
Potassium: 4.2 mmol/L (ref 3.5–5.1)
Sodium: 133 mmol/L — ABNORMAL LOW (ref 135–145)

## 2023-05-09 LAB — MAGNESIUM: Magnesium: 1.9 mg/dL (ref 1.7–2.4)

## 2023-05-09 LAB — CBC
HCT: 28.2 % — ABNORMAL LOW (ref 39.0–52.0)
Hemoglobin: 9.1 g/dL — ABNORMAL LOW (ref 13.0–17.0)
MCH: 31 pg (ref 26.0–34.0)
MCHC: 32.3 g/dL (ref 30.0–36.0)
MCV: 95.9 fL (ref 80.0–100.0)
Platelets: 609 K/uL — ABNORMAL HIGH (ref 150–400)
RBC: 2.94 MIL/uL — ABNORMAL LOW (ref 4.22–5.81)
RDW: 18.4 % — ABNORMAL HIGH (ref 11.5–15.5)
WBC: 19.9 K/uL — ABNORMAL HIGH (ref 4.0–10.5)
nRBC: 0.2 % (ref 0.0–0.2)

## 2023-05-09 MED ORDER — FUROSEMIDE 10 MG/ML IJ SOLN
40.0000 mg | Freq: Once | INTRAMUSCULAR | Status: AC
  Administered 2023-05-09: 40 mg via INTRAVENOUS
  Filled 2023-05-09: qty 4

## 2023-05-09 MED ORDER — MAGNESIUM SULFATE 2 GM/50ML IV SOLN
2.0000 g | Freq: Once | INTRAVENOUS | Status: AC
  Administered 2023-05-09: 2 g via INTRAVENOUS
  Filled 2023-05-09: qty 50

## 2023-05-09 MED ORDER — CARVEDILOL 6.25 MG PO TABS
6.2500 mg | ORAL_TABLET | Freq: Two times a day (BID) | ORAL | Status: DC
  Administered 2023-05-09 – 2023-05-10 (×3): 6.25 mg via ORAL
  Filled 2023-05-09 (×3): qty 1

## 2023-05-09 NOTE — Progress Notes (Signed)
Patient ID: Louis Juarez, male   DOB: Oct 05, 1940, 82 y.o.   MRN: [id number] 1 Day Post-Op    Subjective: NAEO. Working with PT. Complains of some scrotal swelling. Feels that his pain and breathing is improved. ROS negative except as listed above. Objective: Vital signs in last 24 hours: Temp:  [97.6 F (36.4 C)-98.7 F (37.1 C)] 97.8 F (36.6 C) (10/19 0800) Pulse Rate:  [73-97] 73 (10/19 0305) Resp:  [18-25] 20 (10/19 0305) BP: (144-171)/(59-75) 151/59 (10/19 0305) SpO2:  [90 %-98 %] 90 % (10/19 0305) Weight:  [87.4 kg] 87.4 kg (10/19 0356) Last BM Date : 05/08/23  Intake/Output from previous day: 10/18 0701 - 10/19 0700 In: 1200 [I.V.:400; Blood:300; IV Piggyback:500] Out: 1875 [Urine:1225; Blood:650] Intake/Output this shift: Total I/O In: 240 [P.O.:240] Out: -   General appearance: alert and cooperative Resp: equal chest rise bilaterally on 2L [place]. GI: soft, NT Extremities: dressing R elbow Neuro: A&O  Lab Results: I personally reviewed all labs for past 24h  Studies/Results: I personally reviewed all new imaging for past 24h   Assessment/Plan: Fall from ladder, 20 ft   Hemmorrhagic shock - resolved Pelvic fractures (R iliac bone, R pubic rami, and right and left sacrum with 10cm diastasis of pubic symphysis) -  Foley and binder placed in trauma bay. OR ortho 10/4 perc fixation of numerous pelvic fxs, ORIF right elbow. ORIF of pubic symphysis and R sup pubic ramus, R posterior pelvis/SI joint with Dr. Jena Juarez on 10/18 - NWB RLE and WB LLE for transfers only Open R elbow/olecranon fx - Per Ortho --> I&D 10/4, infection noted 10/14 and Dr Louis Juarez removed a couple of sutures. Washout in OR 10/18 Dr. Jena Juarez.  - Continue IV Zosyn R 6-12th rib fxs - multimodal pain control, pulm toilet, some pul Right pleural effusion - requiring 2L [place], not on home oxygen - Will give 40 of lasix - AM CXR R T10 TP fxs - multimodal pain control.  Age-indeterminate mild compression  fractures of T4, T7 and T12 - discussed with NSGY Dr. Jordan Juarez who reviewed images. Does not need OR or brace/spine precautions. Incidental findings - Pulmonary nodules and Severe atherosclerotic calcifications of the left carotid bulb and proximal left cervical ICA. 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 Restarting decreased dose of home coreg, 6.25 BID today, will increase if BP remains stable FEN: Heart healthy diet, SLIV. +BM   -Hypomagnesemia: 2g Mag sulfate ID: Tdap in ED, Ancef in ED for open fracture; Zosyn started 10/13 due to colitis noted on CT and possible RLL pneumonia. Purulent D/C from R elbow - washout 10/18 by Dr. Jena Juarez               VTE: SCDs, LMWH Foley - replaced Dispo: 4NP, OR with Dr. Jena Juarez, PT/OT, anticipate SNF vs CIR  LOS: 15 days    I spent a total of 35 minutes in both face-to-face and non-face-to-face activities, excluding procedures performed, for this visit on the date of this encounter.  Louis Duff, MD [facility] Surgery   05/09/2023

## 2023-05-09 NOTE — Progress Notes (Signed)
   ORTHOPAEDIC PROGRESS NOTE  s/p Procedure(s): OPEN REDUCTION INTERNAL FIXATION (ORIF) PELVIC FRACTURE IRRIGATION AND DEBRIDEMENT ELBOW  SUBJECTIVE:  Denies pain. Thinks he had back surgery. Has no complaints  OBJECTIVE: PE:  Vitals:   05/09/23 0800 05/09/23 1103  BP:    Pulse:    Resp:    Temp: 97.8 F (36.6 C) 97.7 F (36.5 C)  SpO2:     General: Alert, reading in bedside chair, NAD Resp: [place] 2L without increased WOB MSK: RUE: Incision R elbow CDI without erythema or active purulence. NVI distally. No pain with should abduction maintaining flexed elbow.  LUE: spontaneously moves extremity without difficulty  Incision over pubis with maintained post-op bandage without saturation, CDI.  RLE: minimal tenderness to palpation over right pelvis. NVI distally. Ankle flexion extension intact, sensation over lateral/anterior cutaneous, saphenous, common peroneal intact. EHL and FHL intact. DP 2+. NO appreciable edema LLE: no pain or difficulty with active ROM of left knee, ankle foot. Sensation and motor function intact distally.      ASSESSMENT: Louis Juarez is a 82 y.o. male s/p ORIF pubic symphysis, right superior rami, posterior pelvis/SI joint, I/D of right elbow  10/18  PLAN: - Continue with a Zosyn for another 72 hours, ongoing wound checks    -  mobilize with physical and Occupational Therapy.   Weightbearing: - Weight bearing LLE for transfers only - Strict non weight bearing RLE - WBAT RUE Insicional and dressing care: Reinforce dressings as needed- Xeroform, sterile kerlex bulky dressing to right elbow applied today  Orthopedic device(s): None Showering:  VTE prophylaxis: Lovenox 40mg  qd  while inpatient  , hold if active bleeding Pain control: multi modal  Follow - up plan:  pending dispo  Contact information:   Use [email or web address] to contact on call provider   Sander Radon, PA-C Attending Ramond Marrow, MD

## 2023-05-09 NOTE — Evaluation (Signed)
Physical Therapy Re- Evaluation Patient Details Name: Louis Juarez MRN: [id number] DOB: 1941-04-27 Today's Date: 05/09/2023  History of Present Illness  82 y.o. male presents to [facility] on 04/24/2023 after fall from ladder. Pt found to have open book pelvic fx, open R olceranon fx, R rib 6-12 fx, R T10 TP fx, and age-indeterminate mild compression fxs of T4, T7 and T12. Pt underwent percutaneous fixation of pelvic fxs and open treatment of R olecranon fx on 10/4. Underwent ORIF R pelvis and I&D R elbow 10/18. PMH includes heart failure, ICD.  Clinical Impression  Pt admitted with above diagnosis. Pt does not remember that he had surgery yesterday, needed to be reminded several times throughout session, also cannot remember WB status. Pt needed to have BM. Max A needed to come to EOB and stand to stedy. Max A for power up and to keep wt off RLE. Pt with small BM on BSC and then turned to recliner in stedy. Pt with some scrotal swelling, elevated on blanket end of session. AP's encouraged in chair as well as IS and flutter valve. Patient will benefit from intensive inpatient follow up therapy, >3 hours/day.  Pt currently with functional limitations due to the deficits listed below (see PT Problem List). Pt will benefit from acute skilled PT to increase their independence and safety with mobility to allow discharge.           If plan is discharge home, recommend the following: Two people to help with walking and/or transfers;Two people to help with bathing/dressing/bathroom;Assistance with cooking/housework;Assist for transportation;Help with stairs or ramp for entrance   Can travel by private vehicle        Equipment Recommendations Wheelchair (measurements PT);Wheelchair cushion (measurements PT);Hospital bed;Hoyer lift  Recommendations for Other Services  Rehab consult    Functional Status Assessment Patient has had a recent decline in their functional status and demonstrates the  ability to make significant improvements in function in a reasonable and predictable amount of time.     Precautions / Restrictions Precautions Precautions: Fall Restrictions Weight Bearing Restrictions: Yes RUE Weight Bearing: Weight bearing as tolerated RLE Weight Bearing: Non weight bearing LLE Weight Bearing: Weight bearing as tolerated LLE Partial Weight Bearing Percentage or Pounds: Marland Kitchen      Mobility  Bed Mobility Overal bed mobility: Needs Assistance Bed Mobility: Supine to Sit     Supine to sit: Max assist     General bed mobility comments: cues for rolling and grasping L rail with RUE. Max A to LE's to come off EOB and at trunk    Transfers Overall transfer level: Needs assistance Equipment used: Ambulation equipment used Transfers: Bed to chair/wheelchair/BSC, Sit to/from Stand Sit to Stand: Max assist           General transfer comment: pt needed to have a BM. Mod A for power up and to keep wt off RLE (which pt is avoidant of anyway due to pain, though cannot remember WB status). Transferred to [facility] for BM and then recliner Transfer via Lift Equipment: Stedy  Ambulation/Gait               General Gait Details: transfers only  Careers information officer     Tilt Bed    Modified Rankin (Stroke Patients Only)       Balance Overall balance assessment: Needs assistance Sitting-balance support: Feet supported, Bilateral upper extremity supported Sitting balance-Leahy Scale: Poor Sitting balance -  Comments: L lean due to pain, min A to maintain sitting Postural control: Left lateral lean Standing balance support: Bilateral upper extremity supported, Reliant on assistive device for balance Standing balance-Leahy Scale: Zero Standing balance comment: max A needed to maintain upright and keep wt off RLE                             Pertinent Vitals/Pain Pain Assessment Pain Assessment: 0-10 Pain Score: 8  Pain  Location: pelvis Pain Descriptors / Indicators: Grimacing Pain Intervention(s): Limited activity within patient's tolerance, Repositioned, Patient requesting pain meds-RN notified    Home Living Family/patient expects to be discharged to:: Private residence Living Arrangements: Spouse/significant other Available Help at Discharge: Family;Available 24 hours/day Type of Home: House Home Access: Stairs to enter Entrance Stairs-Rails: Can reach both Entrance Stairs-Number of Steps: 8 Alternate Level Stairs-Number of Steps: does not go upstairs` Home Layout: Able to live on main level with bedroom/bathroom Home Equipment: None      Prior Function Prior Level of Function : Independent/Modified Independent;Driving                     Extremity/Trunk Assessment   Upper Extremity Assessment Upper Extremity Assessment: Defer to OT evaluation    Lower Extremity Assessment Lower Extremity Assessment: RLE deficits/detail RLE Deficits / Details: unable to lift RLE against gravity RLE Sensation: decreased proprioception RLE Coordination: decreased gross motor    Cervical / Trunk Assessment Cervical / Trunk Assessment: Kyphotic  Communication   Communication Communication: Hearing impairment Cueing Techniques: Verbal cues;Tactile cues  Cognition Arousal: Alert Behavior During Therapy: WFL for tasks assessed/performed Overall Cognitive Status: Impaired/Different from baseline Area of Impairment: Memory, Following commands, Awareness                 Orientation Level: Disoriented to, Situation, Time   Memory: Decreased recall of precautions, Decreased short-term memory Following Commands: Follows one step commands inconsistently, Follows one step commands with increased time Safety/Judgement: Decreased awareness of deficits Awareness: Intellectual Problem Solving: Slow processing, Requires verbal cues, Requires tactile cues, Difficulty sequencing General Comments: pt  does not remember that he had surgery yesterday. Asks same questions every few minutes.        General Comments General comments (skin integrity, edema, etc.): SPO2 remained in 90's on 3L, HR 99 bpm. Scrotal swelling noted, elevated on blanket in chair    Exercises General Exercises - Lower Extremity Ankle Circles/Pumps: AROM, Both, 20 reps, Seated   Assessment/Plan    PT Assessment Patient needs continued PT services  PT Problem List Decreased strength;Decreased activity tolerance;Decreased balance;Decreased mobility;Decreased knowledge of use of DME;Decreased safety awareness;Decreased knowledge of precautions;Pain       PT Treatment Interventions DME instruction;Functional mobility training;Therapeutic activities;Therapeutic exercise;Balance training;Neuromuscular re-education;Patient/family education;Wheelchair mobility training    PT Goals (Current goals can be found in the Care Plan section)  Acute Rehab PT Goals Patient Stated Goal: to reduce pain, return to independence PT Goal Formulation: With patient Time For Goal Achievement: 05/23/23 Potential to Achieve Goals: Fair    Frequency Min 1X/week     Co-evaluation               AM-PAC PT "6 Clicks" Mobility  Outcome Measure Help needed turning from your back to your side while in a flat bed without using bedrails?: A Lot Help needed moving from lying on your back to sitting on the side of a flat bed without using bedrails?:  A Lot Help needed moving to and from a bed to a chair (including a wheelchair)?: Total Help needed standing up from a chair using your arms (e.g., wheelchair or bedside chair)?: Total Help needed to walk in hospital room?: Total Help needed climbing 3-5 steps with a railing? : Total 6 Click Score: 8    End of Session Equipment Utilized During Treatment: Oxygen Activity Tolerance: Patient limited by pain Patient left: with call bell/phone within reach;in chair;with chair alarm set Nurse  Communication: Mobility status;Need for lift equipment (maximove) PT Visit Diagnosis: Other abnormalities of gait and mobility (R26.89);Muscle weakness (generalized) (M62.81);Pain Pain - Right/Left: Right Pain - part of body: Hip    Time: 1020-1104 PT Time Calculation (min) (ACUTE ONLY): 44 min   Charges:   PT Evaluation $PT Re-evaluation: 1 Re-eval PT Treatments $Therapeutic Activity: 23-37 mins PT General Charges $$ ACUTE PT VISIT: 1 Visit         Lyanne Co, PT  Acute Rehab Services Secure chat preferred Office [phone]   Benetta Spar L Briyana Badman 05/09/2023, 11:15 AM

## 2023-05-10 ENCOUNTER — Inpatient Hospital Stay (HOSPITAL_COMMUNITY): Payer: BC Managed Care – PPO

## 2023-05-10 DIAGNOSIS — Z23 Encounter for immunization: Secondary | ICD-10-CM | POA: Diagnosis not present

## 2023-05-10 DIAGNOSIS — S32591A Other specified fracture of right pubis, initial encounter for closed fracture: Secondary | ICD-10-CM | POA: Diagnosis not present

## 2023-05-10 DIAGNOSIS — S52021B Displaced fracture of olecranon process without intraarticular extension of right ulna, initial encounter for open fracture type I or II: Secondary | ICD-10-CM | POA: Diagnosis not present

## 2023-05-10 DIAGNOSIS — R578 Other shock: Secondary | ICD-10-CM | POA: Diagnosis not present

## 2023-05-10 LAB — RENAL FUNCTION PANEL
Albumin: 2.3 g/dL — ABNORMAL LOW (ref 3.5–5.0)
Anion gap: 9 (ref 5–15)
BUN: 17 mg/dL (ref 8–23)
CO2: 26 mmol/L (ref 22–32)
Calcium: 8.3 mg/dL — ABNORMAL LOW (ref 8.9–10.3)
Chloride: 100 mmol/L (ref 98–111)
Creatinine, Ser: 0.73 mg/dL (ref 0.61–1.24)
GFR, Estimated: >60 mL/min (ref >60)
Glucose, Bld: 150 mg/dL — ABNORMAL HIGH (ref 70–99)
Phosphorus: 3.1 mg/dL (ref 2.5–4.6)
Potassium: 3.8 mmol/L (ref 3.5–5.1)
Sodium: 135 mmol/L (ref 135–145)

## 2023-05-10 LAB — CBC
HCT: 27.9 % — ABNORMAL LOW (ref 39.0–52.0)
Hemoglobin: 8.9 g/dL — ABNORMAL LOW (ref 13.0–17.0)
MCH: 31.1 pg (ref 26.0–34.0)
MCHC: 31.9 g/dL (ref 30.0–36.0)
MCV: 97.6 fL (ref 80.0–100.0)
Platelets: 591 10*3/uL — ABNORMAL HIGH (ref 150–400)
RBC: 2.86 MIL/uL — ABNORMAL LOW (ref 4.22–5.81)
RDW: 18.3 % — ABNORMAL HIGH (ref 11.5–15.5)
WBC: 19 10*3/uL — ABNORMAL HIGH (ref 4.0–10.5)
nRBC: 0 % (ref 0.0–0.2)

## 2023-05-10 LAB — TROPONIN I (HIGH SENSITIVITY): Troponin I (High Sensitivity): 12 ng/L (ref <18)

## 2023-05-10 LAB — MAGNESIUM: Magnesium: 2.1 mg/dL (ref 1.7–2.4)

## 2023-05-10 MED ORDER — POTASSIUM CHLORIDE CRYS ER 20 MEQ PO TBCR
40.0000 meq | EXTENDED_RELEASE_TABLET | Freq: Two times a day (BID) | ORAL | Status: DC
  Administered 2023-05-10 – 2023-05-13 (×7): 40 meq via ORAL
  Filled 2023-05-10 (×7): qty 2

## 2023-05-10 MED ORDER — POLYETHYLENE GLYCOL 3350 17 G PO PACK: 17.0000 g | PACK | Freq: Every day | ORAL | Status: DC | PRN

## 2023-05-10 MED ORDER — MAGNESIUM SULFATE 2 GM/50ML IV SOLN
2.0000 g | Freq: Once | INTRAVENOUS | Status: AC
  Administered 2023-05-10: 2 g via INTRAVENOUS
  Filled 2023-05-10: qty 50

## 2023-05-10 MED ORDER — FUROSEMIDE 20 MG PO TABS
20.0000 mg | ORAL_TABLET | Freq: Two times a day (BID) | ORAL | Status: DC
  Administered 2023-05-10 – 2023-05-13 (×6): 20 mg via ORAL
  Filled 2023-05-10 (×6): qty 1

## 2023-05-10 MED ORDER — CARVEDILOL 12.5 MG PO TABS
12.5000 mg | ORAL_TABLET | Freq: Two times a day (BID) | ORAL | Status: DC
  Administered 2023-05-10: 12.5 mg via ORAL
  Filled 2023-05-10: qty 1

## 2023-05-10 MED ORDER — SPIRONOLACTONE 25 MG PO TABS
25.0000 mg | ORAL_TABLET | Freq: Every day | ORAL | Status: DC
  Administered 2023-05-10 – 2023-05-13 (×4): 25 mg via ORAL
  Filled 2023-05-10 (×4): qty 1

## 2023-05-10 MED ORDER — GUAIFENESIN-DM 100-10 MG/5ML PO SYRP
10.0000 mL | ORAL_SOLUTION | Freq: Four times a day (QID) | ORAL | Status: DC | PRN
  Administered 2023-05-11 – 2023-05-13 (×2): 10 mL via ORAL
  Filled 2023-05-10 (×2): qty 10

## 2023-05-10 NOTE — Progress Notes (Addendum)
    Subjective: Patient reports pain as mild to moderate.  Tolerating diet.  Urinating.   No CP, SOB.  Has mobilized a small amount OOB with PT yesterday. Having trouble remembering WB status.   Objective:   VITALS:   Vitals:   05/09/23 1623 05/09/23 1926 05/09/23 2346 05/10/23 0327  BP:   (!) 142/63 (!) 123/51  Pulse: 85  78 69  Resp: 20  20 18   Temp:  98.1 F (36.7 C) 97.6 F (36.4 C) 97.9 F (36.6 C)  TempSrc:  Axillary Oral Axillary  SpO2: 95%  97% 95%  Weight:      Height:          Latest Ref Rng & Units 05/09/2023    6:12 AM 05/08/2023    3:28 PM 05/07/2023    6:35 AM  CBC  WBC 4.0 - 10.5 K/uL 19.9  25.7  21.1   Hemoglobin 13.0 - 17.0 g/dL 9.1  9.5  8.9   Hematocrit 39.0 - 52.0 % 28.2  30.0  27.8   Platelets 150 - 400 K/uL 609  614  658       Latest Ref Rng & Units 05/09/2023    6:12 AM 05/08/2023    3:28 PM 05/07/2023    6:35 AM  BMP  Glucose 70 - 99 mg/dL 161  096  045   BUN 8 - 23 mg/dL 15  12  16    Creatinine 0.61 - 1.24 mg/dL 4.09  8.11  9.14   Sodium 135 - 145 mmol/L 133  134  131   Potassium 3.5 - 5.1 mmol/L 4.2  4.7  4.4   Chloride 98 - 111 mmol/L 102  100  99   CO2 22 - 32 mmol/L 23  22  24    Calcium 8.9 - 10.3 mg/dL 8.5  8.8  8.5    Intake/Output      10/19 0701 10/20 0700 10/20 0701 10/21 0700   P.O. 360    I.V. (mL/kg)     Blood     IV Piggyback     Total Intake(mL/kg) 360 (4.1)    Urine (mL/kg/hr) 2550 (1.2)    Blood     Total Output 2550    Net -2190         Urine Occurrence 1 x       Physical Exam: General: NAD.  Sitting up in bed, calm, comfortable Resp: No increased wob Cardio: regular rate and rhythm ABD soft Neurologically intact MSK Neurovascularly intact Sensation intact distally Intact pulses distally Dorsiflexion/Plantar flexion intact L elbow and hand ROM intact Incisions: dressings C/D/I Ecchymosis to the right lower flank   Assessment: 2 Days Post-Op  S/P Procedure(s) (LRB): OPEN REDUCTION INTERNAL  FIXATION (ORIF) PELVIC FRACTURE (Right) IRRIGATION AND DEBRIDEMENT ELBOW (Right) by Dr. Jena Gauss on 05/08/23  Principal Problem:   Pelvic fracture (HCC)   Plan:   Advance diet Up with therapy Incentive Spirometry Elevate and Apply ice  Weightbearing: WBAT RUE, NWB RLE, WB for transfers only LLE Insicional and dressing care: Dressings left intact until follow-up and Reinforce dressings as needed Orthopedic device(s): None Showering: Keep dressing dry VTE prophylaxis:  Lovenox 30mg  bid  , SCDs, ambulation Pain control: PRN Contact information for today:  Margarita Rana MD, Levester Fresh PA-C  Dispo:  TBD based on PT evaluations     Marzetta Board Office [phone] 05/10/2023, 7:47 AM

## 2023-05-10 NOTE — Progress Notes (Signed)
Patient ID: Louis Juarez, male   DOB: 1941-02-23, 82 y.o.   MRN: [id number] 2 Days Post-Op    Interval/Subjective: Tele called with ST elevation, EKG ordered shows PVCs, patient asymptomatic ROS negative except as listed above. Objective: Vital signs in last 24 hours: Temp:  [97.6 F (36.4 C)-98.5 F (36.9 C)] 97.9 F (36.6 C) (10/20 0327) Pulse Rate:  [69-85] 82 (10/20 0800) Resp:  [18-30] 20 (10/20 0800) BP: (123-145)/(51-70) 145/63 (10/20 0800) SpO2:  [93 %-97 %] 94 % (10/20 0800) Last BM Date : 05/10/23  Intake/Output from previous day: 10/19 0701 - 10/20 0700 In: 360 [P.O.:360] Out: 2550 [Urine:2550] Intake/Output this shift: No intake/output data recorded.  General appearance: alert and cooperative Resp: equal chest rise bilaterally on 2L [place]. GI: soft, NT Extremities: dressing R elbow Neuro: A&O  Lab Results: I personally reviewed all labs for past 24h  Studies/Results: I personally reviewed all new imaging for past 24h   Assessment/Plan: Fall from ladder, 20 ft   Hemmorrhagic shock - resolved Pelvic fractures (R iliac bone, R pubic rami, and right and left sacrum with 10cm diastasis of pubic symphysis) -  Foley and binder placed in trauma bay. OR ortho 10/4 perc fixation of numerous pelvic fxs, ORIF right elbow. ORIF of pubic symphysis and R sup pubic ramus, R posterior pelvis/SI joint with Dr. Jena Gauss on 10/18 - NWB RLE and WB LLE for transfers only Open R elbow/olecranon fx - Per Ortho --> I&D 10/4, infection noted 10/14 and Dr Jena Gauss removed a couple of sutures. Washout in OR 10/18 Dr. Jena Gauss.  - Continue IV Zosyn R 6-12th rib fxs - multimodal pain control, pulm toilet, increased secretions, starting guaifenesin-dextromethorphan to help thin secretions Right pleural effusion - requiring 2L [place], not on home oxygen - Restarting home lasix 20 BID and spironolactone 25qd R T10 TP fxs - multimodal pain control.  Age-indeterminate mild compression fractures of T4,  T7 and T12 - discussed with NSGY Dr. Jordan Likes who reviewed images. Does not need OR or brace/spine precautions. Incidental findings - Pulmonary nodules and Severe atherosclerotic calcifications of the left carotid bulb and proximal left cervical ICA. Will need pcp f/u for further w/u as outpatient  CHF/DCM - POA PVCs on EKG- 2g magnesium and potassium ordered ICD in place HTN - Low BP on 10/15,  resolved. Restarting decreased dose of home coreg, Increasing coreg to 12.5 BID FEN: Heart healthy diet, SLIV. +BM  ID: Tdap in ED, Ancef in ED for open fracture; Zosyn started 10/13 due to colitis noted on CT and possible RLL pneumonia. Purulent D/C from R elbow - washout 10/18 by Dr. Jena Gauss               VTE: SCDs, LMWH Foley - replaced Dispo: 4NP, OR with Dr. Jena Gauss, PT/OT, anticipate SNF vs CIR  LOS: 16 days    I spent a total of 35 minutes in both face-to-face and non-face-to-face activities, excluding procedures performed, for this visit on the date of this encounter.  Donata Duff, MD [facility] Surgery   05/10/2023

## 2023-05-10 NOTE — Progress Notes (Signed)
Received a call from central tele, reporting  the patient had st elevation in 3 leads. Obtained EKG, patient's vitals stable, patient not reporting any pain or pressure. Notified PA.

## 2023-05-11 LAB — MAGNESIUM: Magnesium: 2 mg/dL (ref 1.7–2.4)

## 2023-05-11 LAB — RENAL FUNCTION PANEL
Albumin: 2.1 g/dL — ABNORMAL LOW (ref 3.5–5.0)
Anion gap: 6 (ref 5–15)
BUN: 15 mg/dL (ref 8–23)
CO2: 25 mmol/L (ref 22–32)
Calcium: 8.1 mg/dL — ABNORMAL LOW (ref 8.9–10.3)
Chloride: 102 mmol/L (ref 98–111)
Creatinine, Ser: 0.72 mg/dL (ref 0.61–1.24)
GFR, Estimated: >60 mL/min (ref >60)
Glucose, Bld: 201 mg/dL — ABNORMAL HIGH (ref 70–99)
Phosphorus: 2.9 mg/dL (ref 2.5–4.6)
Potassium: 4.2 mmol/L (ref 3.5–5.1)
Sodium: 133 mmol/L — ABNORMAL LOW (ref 135–145)

## 2023-05-11 LAB — CBC
HCT: 27.5 % — ABNORMAL LOW (ref 39.0–52.0)
Hemoglobin: 8.5 g/dL — ABNORMAL LOW (ref 13.0–17.0)
MCH: 30.7 pg (ref 26.0–34.0)
MCHC: 30.9 g/dL (ref 30.0–36.0)
MCV: 99.3 fL (ref 80.0–100.0)
Platelets: 600 10*3/uL — ABNORMAL HIGH (ref 150–400)
RBC: 2.77 MIL/uL — ABNORMAL LOW (ref 4.22–5.81)
RDW: 17.9 % — ABNORMAL HIGH (ref 11.5–15.5)
WBC: 17.3 10*3/uL — ABNORMAL HIGH (ref 4.0–10.5)
nRBC: 0 % (ref 0.0–0.2)

## 2023-05-11 LAB — GLUCOSE, CAPILLARY: Glucose-Capillary: 215 mg/dL — ABNORMAL HIGH (ref 70–99)

## 2023-05-11 MED ORDER — CARVEDILOL 25 MG PO TABS
25.0000 mg | ORAL_TABLET | Freq: Two times a day (BID) | ORAL | Status: DC
  Administered 2023-05-11 – 2023-05-13 (×5): 25 mg via ORAL
  Filled 2023-05-11 (×5): qty 1

## 2023-05-11 NOTE — Evaluation (Signed)
Occupational Therapy Evaluation Patient Details Name: Louis Juarez MRN: [id number] DOB: [birth year] Today's Date: 05/11/2023   History of Present Illness 82 y.o. male presents to [facility] hospital on 04/24/2023 after fall from ladder. Pt found to have open book pelvic fx, open R olceranon fx, R rib 6-12 fx, R T10 TP fx, and age-indeterminate mild compression fxs of T4, T7 and T12. Pt underwent percutaneous fixation of pelvic fxs and open treatment of R olecranon fx on 10/4. Underwent ORIF R pelvis and I&D R elbow 10/18. PMH includes heart failure, ICD.   Clinical Impression   Patient with need for re-eval after surgery on 10/18 for ORIF of pelvis and I&D of elbow. Continued difficulty recalling precautions. OT speaking with wife and patient to promote use of slide board or scooting back to bed as patient isnt fully cognizant of the weight he is putting through his leg with the Stedy. Patient is mod to max for ADL management especially with regard to lower body dressing. OT continues to recommend intensive rehab > 3hours. OT will follow.      If plan is discharge home, recommend the following: Two people to help with walking and/or transfers;A lot of help with bathing/dressing/bathroom;Assistance with cooking/housework;Assist for transportation;Help with stairs or ramp for entrance;Supervision due to cognitive status    Functional Status Assessment  Patient has had a recent decline in their functional status and demonstrates the ability to make significant improvements in function in a reasonable and predictable amount of time.  Equipment Recommendations  Wheelchair (measurements OT);Wheelchair cushion (measurements OT);Hospital bed    Recommendations for Other Services       Precautions / Restrictions Precautions Precautions: Fall Restrictions Weight Bearing Restrictions: Yes RUE Weight Bearing: Weight bearing as tolerated RLE Weight Bearing: Non weight bearing LLE Weight Bearing: Weight  bearing as tolerated LLE Partial Weight Bearing Percentage or Pounds: Marland Kitchen      Mobility Bed Mobility Overal bed mobility: Needs Assistance             General bed mobility comments: up in recliner upon arrival    Transfers Overall transfer level: Needs assistance                 General transfer comment: up in chair upon arrival      Balance Overall balance assessment: Needs assistance Sitting-balance support: Feet supported, Bilateral upper extremity supported Sitting balance-Leahy Scale: Poor Sitting balance - Comments: L lean due to pain, min A to maintain sitting Postural control: Left lateral lean Standing balance support: Bilateral upper extremity supported, Reliant on assistive device for balance Standing balance-Leahy Scale: Zero Standing balance comment: max A needed to maintain upright and keep wt off RLE                           ADL either performed or assessed with clinical judgement   ADL Overall ADL's : Needs assistance/impaired Eating/Feeding: Set up;Sitting   Grooming: Wash/dry hands;Wash/dry face;Set up;Sitting   Upper Body Bathing: Minimal assistance;Sitting   Lower Body Bathing: Maximal assistance;Total assistance;Sit to/from stand;Sitting/lateral leans   Upper Body Dressing : Minimal assistance;Sitting   Lower Body Dressing: Total assistance;Maximal assistance;Sit to/from stand;Sitting/lateral leans     Toilet Transfer Details (indicate cue type and reason): up in chair upon arrival Toileting- Clothing Manipulation and Hygiene: Total assistance;Sitting/lateral lean;Sit to/from stand Toileting - Clothing Manipulation Details (indicate cue type and reason): unable to complete peri-care in standing     Functional mobility during  ADLs: Moderate assistance;Cueing for sequencing;Cueing for safety;+2 for physical assistance;+2 for safety/equipment General ADL Comments: Patient wit need for re-eval after surgery on 10/18 for ORIF of  pelvis and I&D of elbow. Continuesd difficulty recalling precautions. OT speaking with wife and patient to promote use of slide board or scooting back to bed as patient isnt fully cognizant of the weight he is putting through his leg with the Stedy. OT continues to recommend intensive rehab > 3hours. OT will follow.     Vision Baseline Vision/History: 1 Wears glasses (Readers) Ability to See in Adequate Light: 0 Adequate Patient Visual Report: No change from baseline Vision Assessment?: No apparent visual deficits     Perception Perception: Within Functional Limits       Praxis Praxis: WFL       Pertinent Vitals/Pain Pain Assessment Pain Assessment: Faces Faces Pain Scale: Hurts even more Pain Location: pelvis Pain Descriptors / Indicators: Grimacing Pain Intervention(s): Limited activity within patient's tolerance, Monitored during session, Premedicated before session     Extremity/Trunk Assessment Upper Extremity Assessment RUE Deficits / Details: R olecranon fx with ORIF I&D completed on 10/18. pt lacks full extension minimally. Full ROM of wrist and digits . pt can do pad to pad, no change in sensation RUE Sensation: WNL RUE Coordination: WNL   Lower Extremity Assessment Lower Extremity Assessment: Defer to PT evaluation   Cervical / Trunk Assessment Cervical / Trunk Assessment: Kyphotic   Communication Communication Communication: Hearing impairment   Cognition Arousal: Alert Behavior During Therapy: WFL for tasks assessed/performed Overall Cognitive Status: Impaired/Different from baseline Area of Impairment: Memory, Following commands, Awareness, Problem solving                 Orientation Level: Disoriented to, Situation   Memory: Decreased recall of precautions, Decreased short-term memory Following Commands: Follows one step commands inconsistently, Follows one step commands with increased time Safety/Judgement: Decreased awareness of  deficits Awareness: Intellectual Problem Solving: Slow processing, Requires verbal cues, Requires tactile cues, Difficulty sequencing General Comments: Repetitive questioning, cannot recall WB status for RLE. Wife present for session     General Comments       Exercises     Shoulder Instructions      Home Living Family/patient expects to be discharged to:: Private residence Living Arrangements: Spouse/significant other Available Help at Discharge: Family;Available 24 hours/day Type of Home: House Home Access: Stairs to enter [employer] of Steps: 8 Entrance Stairs-Rails: Can reach both Home Layout: Able to live on main level with bedroom/bathroom Alternate Level Stairs-Number of Steps: does not go upstairs`   Bathroom Shower/Tub: Producer, television/film/video: Standard (also has toilet seat) Bathroom Accessibility: Yes   Home Equipment: None      Lives With: Spouse    Prior Functioning/Environment Prior Level of Function : Independent/Modified Independent;Driving                        OT Problem List: Decreased strength;Decreased activity tolerance;Decreased range of motion;Impaired balance (sitting and/or standing);Decreased coordination;Decreased safety awareness;Decreased knowledge of use of DME or AE;Decreased knowledge of precautions;Pain;Impaired UE functional use      OT Treatment/Interventions: Self-care/ADL training;Therapeutic exercise;Energy conservation;DME and/or AE instruction;Manual therapy;Therapeutic activities;Balance training    OT Goals(Current goals can be found in the care plan section) Acute Rehab OT Goals Patient Stated Goal: to get better OT Goal Formulation: With patient/family Time For Goal Achievement: 05/25/23 Potential to Achieve Goals: Good  OT Frequency: Min 1X/week  Co-evaluation              AM-PAC OT "6 Clicks" Daily Activity     Outcome Measure Help from another person eating meals?: A  Little Help from another person taking care of personal grooming?: A Little Help from another person toileting, which includes using toliet, bedpan, or urinal?: A Lot Help from another person bathing (including washing, rinsing, drying)?: A Lot Help from another person to put on and taking off regular upper body clothing?: A Lot Help from another person to put on and taking off regular lower body clothing?: Total 6 Click Score: 13   End of Session Nurse Communication: Mobility status;Precautions  Activity Tolerance: Patient tolerated treatment well Patient left: in chair;with call bell/phone within reach;with chair alarm set;with family/visitor present  OT Visit Diagnosis: Unsteadiness on feet (R26.81);Muscle weakness (generalized) (M62.81) Pain - Right/Left: Right Pain - part of body: Hip                Time: 4782-9562 OT Time Calculation (min): 15 min Charges:  OT General Charges $OT Visit: 1 Visit OT Evaluation $OT Eval Moderate Complexity: 1 Mod  Pollyann Glen E. Xion Debruyne, OTR/L Acute Rehabilitation Services [phone]   Cherlyn Cushing 05/11/2023, 12:21 PM

## 2023-05-11 NOTE — Progress Notes (Signed)
Ortho Trauma Note  Doing okay having pain when he mobilizes.  States that when he not moving he does not have any pain.  Most of the pain is located in the posterior left aspect of his pelvis.  No pain in the elbow.  The dressing has been changed every shift.  Physical exam: Incisions to his front and side of his pelvis are clean dry and intact and are able to be left open to air.  His right elbow has some serous drainage from his incisions but no erythema.  It has a much improved appearance from preoperative.  A/P status post open reduction internal fixation revision of the pelvis.  Also with I&D of right elbow infection  Nonweightbearing right lower extremity, weightbearing for transfers only left lower extremity. Continue PT OT Continue antibiotics for right elbow could consider transitioning to oral antibiotics over the next 24 to 48 hours. Care discussed over the phone with the patient's wife.  Roby Lofts, MD [facility] [phone] (office) [email or web address]

## 2023-05-11 NOTE — Progress Notes (Signed)
Patient transferred to 5N20, report called to Sami, Charity fundraiser.

## 2023-05-11 NOTE — Plan of Care (Signed)
  Problem: Education: Goal: Knowledge of General Education information will improve Description: Including pain rating scale, medication(s)/side effects and non-pharmacologic comfort measures 05/11/2023 0339 by Elijah Birk, RN Outcome: Progressing 05/11/2023 0339 by Elijah Birk, RN Outcome: Progressing   Problem: Health Behavior/Discharge Planning: Goal: Ability to manage health-related needs will improve 05/11/2023 0339 by Elijah Birk, RN Outcome: Progressing 05/11/2023 0339 by Elijah Birk, RN Outcome: Progressing   Problem: Clinical Measurements: Goal: Ability to maintain clinical measurements within normal limits will improve 05/11/2023 0339 by Elijah Birk, RN Outcome: Progressing 05/11/2023 0339 by Elijah Birk, RN Outcome: Progressing Goal: Will remain free from infection 05/11/2023 0339 by Elijah Birk, RN Outcome: Progressing 05/11/2023 0339 by Elijah Birk, RN Outcome: Progressing Goal: Diagnostic test results will improve 05/11/2023 0339 by Elijah Birk, RN Outcome: Progressing 05/11/2023 0339 by Elijah Birk, RN Outcome: Progressing Goal: Respiratory complications will improve 05/11/2023 0339 by Elijah Birk, RN Outcome: Progressing 05/11/2023 0339 by Elijah Birk, RN Outcome: Progressing Goal: Cardiovascular complication will be avoided 05/11/2023 0339 by Elijah Birk, RN Outcome: Progressing 05/11/2023 0339 by Elijah Birk, RN Outcome: Progressing

## 2023-05-11 NOTE — Progress Notes (Signed)
Patient ID: Louis Juarez, male   DOB: 30-Sep-1940, 82 y.o.   MRN: [id number] 3 Days Post-Op    Interval/Subjective: No complaints, states better able to cough up phlegm ROS negative except as listed above. Objective: Vital signs in last 24 hours: Temp:  [97.6 F (36.4 C)-98.6 F (37 C)] 98 F (36.7 C) (10/21 0253) Pulse Rate:  [68-82] 68 (10/21 0253) Resp:  [18-20] 20 (10/21 0253) BP: (131-145)/(56-63) 140/56 (10/21 0253) SpO2:  [92 %-94 %] 94 % (10/21 0253) Weight:  [86.9 kg] 86.9 kg (10/21 0500) Last BM Date : 05/10/23  Intake/Output from previous day: 10/20 0701 - 10/21 0700 In: 752.1 [P.O.:240; IV Piggyback:512.1] Out: 1450 [Urine:1450] Intake/Output this shift: No intake/output data recorded.  General appearance: alert and cooperative Resp: equal chest rise bilaterally on 2L [place]. GI: soft, NT Extremities: dressing R elbow Neuro: A&O  Lab Results: Labs pending  Studies/Results: I personally reviewed all new imaging for past 24h   Assessment/Plan: Fall from ladder, 20 ft   Hemmorrhagic shock - resolved Pelvic fractures (R iliac bone, R pubic rami, and right and left sacrum with 10cm diastasis of pubic symphysis) -  Foley and binder placed in trauma bay. OR ortho 10/4 perc fixation of numerous pelvic fxs, ORIF right elbow. ORIF of pubic symphysis and R sup pubic ramus, R posterior pelvis/SI joint with Dr. Jena Gauss on 10/18 - NWB RLE and WB LLE for transfers only Open R elbow/olecranon fx - Per Ortho --> I&D 10/4, infection noted 10/14 and Dr Jena Gauss removed a couple of sutures. Washout in OR 10/18 Dr. Jena Gauss.  - Continue IV Zosyn, end tonight R 6-12th rib fxs - multimodal pain control, pulm toilet, increased secretions, starting guaifenesin-dextromethorphan to help thin secretions Right pleural effusion - requiring 2L [place], not on home oxygen - Continue home lasix 20 BID and spironolactone 25qd R T10 TP fxs - multimodal pain control.  Age-indeterminate mild compression  fractures of T4, T7 and T12 - discussed with NSGY Dr. Jordan Likes who reviewed images. Does not need OR or brace/spine precautions. Incidental findings - Pulmonary nodules and Severe atherosclerotic calcifications of the left carotid bulb and proximal left cervical ICA. 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 +BM  ID: Tdap in ED, Ancef in ED for open fracture; Zosyn started 10/13 due to colitis noted on CT and possible RLL pneumonia. Purulent D/C from R elbow - washout 10/18 by Dr. Jena Gauss, zosyn end tonight               VTE: SCDs, LMWH Foley - remove today Dispo: 4NP, OR with Dr. Jena Gauss, PT/OT, anticipate SNF vs CIR  LOS: 17 days    I spent a total of 35 minutes in both face-to-face and non-face-to-face activities, excluding procedures performed, for this visit on the date of this encounter.  Donata Duff, MD [facility]   05/11/2023

## 2023-05-11 NOTE — Progress Notes (Signed)
Inpatient Rehab Admissions Coordinator:    CIR following. Pt. Worked with therapy this AM and over the weekend. Will send case to insurance and pursue for admit today.   Megan Salon, MS, CCC-SLP Rehab Admissions Coordinator  [phone] (celll) [phone] (office)

## 2023-05-12 LAB — RENAL FUNCTION PANEL
Albumin: 2.2 g/dL — ABNORMAL LOW (ref 3.5–5.0)
Anion gap: 14 (ref 5–15)
BUN: 16 mg/dL (ref 8–23)
CO2: 23 mmol/L (ref 22–32)
Calcium: 9.6 mg/dL (ref 8.9–10.3)
Chloride: 100 mmol/L (ref 98–111)
Creatinine, Ser: 0.78 mg/dL (ref 0.61–1.24)
GFR, Estimated: >60 mL/min (ref >60)
Glucose, Bld: 150 mg/dL — ABNORMAL HIGH (ref 70–99)
Phosphorus: 3.2 mg/dL (ref 2.5–4.6)
Potassium: 4.6 mmol/L (ref 3.5–5.1)
Sodium: 137 mmol/L (ref 135–145)

## 2023-05-12 LAB — TYPE AND SCREEN
ABO/RH(D): O POS
Antibody Screen: NEGATIVE
Unit division: 0
Unit division: 0

## 2023-05-12 LAB — BPAM RBC
Blood Product Expiration Date: 202411052359
Blood Product Expiration Date: 202411132359
ISSUE DATE / TIME: 202410181119
Unit Type and Rh: 5100
Unit Type and Rh: 5100

## 2023-05-12 LAB — MAGNESIUM: Magnesium: 2 mg/dL (ref 1.7–2.4)

## 2023-05-12 NOTE — Progress Notes (Signed)
Inpatient Rehabilitation Admissions Coordinator   I met at bedside with patient and a friend. I wait insurance determination for a possible CIR admit.  Ottie Glazier, RN, MSN Rehab Admissions Coordinator [phone] 05/12/2023 11:31 AM

## 2023-05-12 NOTE — Plan of Care (Signed)

## 2023-05-12 NOTE — Progress Notes (Signed)
Nutrition Follow-up  DOCUMENTATION CODES:   Not applicable  INTERVENTION:  -continue regular diet, if BG trends 180 or >,  would add CHO modified diet parameter.  -continue Ensure Enlive po TID, each supplement provides 350 kcal and 20 grams of protein.  -continue MVI/Minerals daily for micronutrient support due to suboptimal intakes.  NUTRITION DIAGNOSIS:   Increased nutrient needs related to post-op healing, other (see comment) (trauma) as evidenced by estimated needs.    GOAL:   Patient will meet greater than or equal to 90% of their needs    MONITOR:   PO intake, Supplement acceptance, Labs, Weight trends  REASON FOR ASSESSMENT:   Follow up  ASSESSMENT:   82 year old male who presented to the ED on 10/04 after falling from a tree approximately 20 feet. 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 Pt admitted with open book pelvic fracture (R iliac bone, R pubic rami, and right and left sacrum with 10 cm diastasis of pubic symphysis), open R elbow/olecranon fx, R 6-12 rib fxs, R T10 TP fxs, hemorrhagic shock.  10/04 - s/p percutaneous fixation R posterior pelvic fx, percutaneous fixation L sacrum, percutaneous fixation of R superior public ramus fx, open treatment of R olecranon avulsion fx, I&D of R open olecranon fx, I&D of R open elbow arthrotomy; diet advanced to Heart Healthy 10/07 - Cortrak placement (coiled in proximal stomach), trickle TF ordered to start but never started due to pt breaking his Cortrak 10/08 - Cortrak removed  Intakes recorded average 53% x 7 meals. States he is taking the Ensure well. Noted improved appetite. Today is his [place] Ouch and wife and family brought in meal and cake.  Weight appears to be trending down, possibly reflecting fluid with hx of CHF.  Edema 1+ generalized, right  lower extremity, left lower extremity.  Medications reviewed and include vitamin D3 25 mcg daily, lasix, MVI/Minerals one tab daily, PPI, potassium chloride 40  mEq twice daily, spironolactone.  Labs: CBG 215    Diet Order:   Diet Order             Diet regular Room service appropriate? Yes; Fluid consistency: Thin  Diet effective now                   EDUCATION NEEDS:   Education needs have been addressed  Skin:  Skin Assessment: Skin Integrity Issues: Skin Integrity Issues:: Other (Comment) Incisions: incision to elbow-scant serous drainage, incision to abdomen. Other: ecchymosis/erythema/redness to abdomen, back, scrotum; bilateral arm abrasions  Last BM:  10/22, type 6-medium  Height:   Ht Readings from Last 1 Encounters:  05/08/23 6\' 2"  (1.88 m)    Weight:   Wt Readings from Last 1 Encounters:  05/12/23 84.3 kg    Ideal Body Weight:  86.4 kg  BMI:  Body mass index is 23.86 kg/m.  Estimated Nutritional Needs:   Kcal:  2000-2200  Protein:  100-115 grams  Fluid:  >2.0 L    Alvino Chapel, RDLD Clinical Dietitian See AMION for contact information

## 2023-05-12 NOTE — Progress Notes (Signed)
Physical Therapy Treatment Patient Details Name: Louis Juarez MRN: [id number] DOB: Aug 11, 1940 Today's Date: 05/12/2023   History of Present Illness 82 y.o. male presents to [facility] hospital on 04/24/2023 after fall from ladder. Pt found to have open book pelvic fx, open R olceranon fx, R rib 6-12 fx, R T10 TP fx, and age-indeterminate mild compression fxs of T4, T7 and T12. Pt underwent percutaneous fixation of pelvic fxs and open treatment of R olecranon fx on 10/4. Underwent ORIF R pelvis and I&D R elbow 10/18. PMH includes heart failure, ICD.    PT Comments  Focusing session on bed mobility and lateral scooting.Pt incontinent of large loose stool. Unable to progress to OOB.     If plan is discharge home, recommend the following: Two people to help with walking and/or transfers;Two people to help with bathing/dressing/bathroom;Assistance with cooking/housework;Assist for transportation;Help with stairs or ramp for entrance   Can travel by private vehicle        Equipment Recommendations  Wheelchair (measurements PT);Wheelchair cushion (measurements PT);Hospital bed;Hoyer lift    Recommendations for Other Services       Precautions / Restrictions Precautions Precautions: Fall Restrictions Weight Bearing Restrictions: Yes RUE Weight Bearing: Weight bearing as tolerated RLE Weight Bearing: Non weight bearing LLE Weight Bearing: Weight bearing as tolerated (for transfers only) LLE Partial Weight Bearing Percentage or Pounds: . Other Position/Activity Restrictions: LLE WBAT for transfers only     Mobility  Bed Mobility Overal bed mobility: Needs Assistance Bed Mobility: Rolling, Supine to Sit, Sit to Supine Rolling: Max assist, Used rails   Supine to sit: Max assist Sit to supine: Mod assist   General bed mobility comments: Assist to bring hips and shoulders over to roll. Assist to bring legs off of bed, elevate trunk into sitting, and bring hips to EOB. Assist to bring legs back  up into bed.    Transfers                   General transfer comment: Scooted laterally on EOB with mod assist    Ambulation/Gait                   Stairs             Wheelchair Mobility     Tilt Bed    Modified Rankin (Stroke Patients Only)       Balance Overall balance assessment: Needs assistance Sitting-balance support: Feet supported, Bilateral upper extremity supported Sitting balance-Leahy Scale: Poor Sitting balance - Comments: L lean due to pain in rt pelvis, min A to maintain sitting Postural control: Left lateral lean                                  Cognition Arousal: Alert Behavior During Therapy: WFL for tasks assessed/performed Overall Cognitive Status: Impaired/Different from baseline Area of Impairment: Memory, Following commands, Awareness, Problem solving                     Memory: Decreased recall of precautions, Decreased short-term memory Following Commands: Follows one step commands inconsistently, Follows one step commands with increased time Safety/Judgement: Decreased awareness of deficits Awareness: Intellectual Problem Solving: Slow processing, Requires verbal cues, Requires tactile cues, Difficulty sequencing          Exercises      General Comments        Pertinent Vitals/Pain Pain Assessment Pain Assessment: Faces  Faces Pain Scale: Hurts even more Pain Location: rt pelvis with sitting Pain Descriptors / Indicators: Grimacing Pain Intervention(s): Limited activity within patient's tolerance, Monitored during session, Repositioned    Home Living                          Prior Function            PT Goals (current goals can now be found in the care plan section) Progress towards PT goals: Not progressing toward goals - comment    Frequency    Min 1X/week      PT Plan      Co-evaluation              AM-PAC PT "6 Clicks" Mobility   Outcome  Measure  Help needed turning from your back to your side while in a flat bed without using bedrails?: A Lot Help needed moving from lying on your back to sitting on the side of a flat bed without using bedrails?: A Lot Help needed moving to and from a bed to a chair (including a wheelchair)?: Total Help needed standing up from a chair using your arms (e.g., wheelchair or bedside chair)?: Total Help needed to walk in hospital room?: Total Help needed climbing 3-5 steps with a railing? : Total 6 Click Score: 8    End of Session         PT Visit Diagnosis: Other abnormalities of gait and mobility (R26.89);Muscle weakness (generalized) (M62.81);Pain Pain - Right/Left: Right Pain - part of body: Hip     Time: 5188-4166 PT Time Calculation (min) (ACUTE ONLY): 29 min  Charges:    $Therapeutic Activity: 23-37 mins PT General Charges $$ ACUTE PT VISIT: 1 Visit                     [facility] PT Acute Rehabilitation Services Office [phone]    Angelina Ok [facility] 05/12/2023, 5:27 PM

## 2023-05-12 NOTE — Plan of Care (Signed)

## 2023-05-12 NOTE — Progress Notes (Addendum)
Trauma/Critical Care Follow Up Note  Subjective:    Overnight Issues: Loose stool overnight  Objective:  Vital signs for last 24 hours: Temp:  [97.4 F (36.3 C)-98.2 F (36.8 C)] 98.2 F (36.8 C) (10/22 0750) Pulse Rate:  [70-78] 78 (10/22 0750) Resp:  [17-26] 17 (10/22 0750) BP: (130-157)/(66-74) 153/69 (10/22 0750) SpO2:  [94 %-99 %] 97 % (10/22 0750) Weight:  [84.3 kg] 84.3 kg (10/22 0500)  Hemodynamic parameters for last 24 hours:    Intake/Output from previous day: 10/21 0701 - 10/22 0700 In: 370.5 [P.O.:240; IV Piggyback:130.5] Out: 1250 [Urine:1250]  Intake/Output this shift: Total I/O In: -  Out: 700 [Urine:700]  Vent settings for last 24 hours:    Physical Exam:  Gen: comfortable, no distress Neuro: follows commands, alert, communicative HEENT: PERRL Neck: supple CV: RRR Pulm: unlabored breathing on RA Abd: soft, NT    GU: urine clear and yellow, +spontaneous voids Extr: wwp, no edema  Results for orders placed or performed during the hospital encounter of 04/24/23 (from the past 24 hour(s))  Renal function panel     Status: Abnormal   Collection Time: 05/12/23  7:56 AM  Result Value Ref Range   Sodium 137 135 - 145 mmol/L   Potassium 4.6 3.5 - 5.1 mmol/L   Chloride 100 98 - 111 mmol/L   CO2 23 22 - 32 mmol/L   Glucose, Bld 150 (H) 70 - 99 mg/dL   BUN 16 8 - 23 mg/dL   Creatinine, Ser 1.61 0.61 - 1.24 mg/dL   Calcium 9.6 8.9 - 09.6 mg/dL   Phosphorus 3.2 2.5 - 4.6 mg/dL   Albumin 2.2 (L) 3.5 - 5.0 g/dL   GFR, Estimated >04 >54 mL/min   Anion gap 14 5 - 15  Magnesium     Status: None   Collection Time: 05/12/23  7:56 AM  Result Value Ref Range   Magnesium 2.0 1.7 - 2.4 mg/dL    Assessment & Plan: The plan of care was discussed with the bedside nurse for the day, who is in agreement with this plan and no additional concerns were raised.   Present on Admission:  Pelvic fracture (HCC)    LOS: 18 days   Additional comments:I reviewed  the patient's new clinical lab test results.   and I reviewed the patients new imaging test results.    Fall from ladder, 20 ft   Hemmorrhagic shock - resolved Pelvic fractures (R iliac bone, R pubic rami, and right and left sacrum with 10cm diastasis of pubic symphysis) -  Foley and binder placed in trauma bay. OR ortho 10/4 perc fixation of numerous pelvic fxs, ORIF right elbow. ORIF of pubic symphysis and R sup pubic ramus, R posterior pelvis/SI joint with Dr. Jena Gauss on 10/18 - NWB RLE and WB LLE for transfers only Open R elbow/olecranon fx - Per Ortho --> I&D 10/4, infection noted 10/14 and Dr Jena Gauss removed a couple of sutures. Washout in OR 10/18 Dr. Jena Gauss.              - Continue IV Zosyn, end tonight R 6-12th rib fxs - multimodal pain control, pulm toilet, increased secretions, starting guaifenesin-dextromethorphan to help thin secretions Right pleural effusion - requiring 2L [place], not on home oxygen - Continue home lasix 20 BID and spironolactone 25qd R T10 TP fxs - multimodal pain control.  Age-indeterminate mild compression fractures of T4, T7 and T12 - discussed with NSGY Dr. Jordan Likes who reviewed images. Does not need OR  or brace/spine precautions. Incidental findings - Pulmonary nodules and Severe atherosclerotic calcifications of the left carotid bulb and proximal left cervical ICA. 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 +BM  ID: Tdap in ED, Ancef in ED for open fracture; Zosyn started 10/13 due to colitis noted on CT and possible RLL pneumonia. Purulent D/C from R elbow - washout 10/18 by Dr. Jena Gauss, completed 7d course of zosyn               VTE: SCDs, LMWH Foley - remove today Dispo: 4NP, OR with Dr. Jena Gauss, PT/OT, CIR pending insurance  Diamantina Monks, MD Trauma & General Surgery Please use [email or web address] to contact on call provider  05/12/2023  *Care during the  described time interval was provided by me. I have reviewed this patient's available data, including medical history, events of note, physical examination and test results as part of my evaluation.

## 2023-05-13 ENCOUNTER — Encounter (HOSPITAL_COMMUNITY): Payer: Self-pay | Admitting: Student

## 2023-05-13 ENCOUNTER — Encounter (HOSPITAL_COMMUNITY): Payer: Self-pay | Admitting: Physical Medicine & Rehabilitation

## 2023-05-13 ENCOUNTER — Other Ambulatory Visit: Payer: Self-pay

## 2023-05-13 ENCOUNTER — Inpatient Hospital Stay (HOSPITAL_COMMUNITY)
Admission: AD | Admit: 2023-05-13 | Discharge: 2023-06-01 | DRG: 560 | Disposition: A | Discharge status: Home Health | Payer: BC Managed Care – PPO | Source: Intra-hospital | Attending: Physical Medicine & Rehabilitation | Admitting: Physical Medicine & Rehabilitation

## 2023-05-13 ENCOUNTER — Encounter: Payer: Self-pay | Admitting: Student

## 2023-05-13 DIAGNOSIS — R1310 Dysphagia, unspecified: Secondary | ICD-10-CM | POA: Diagnosis not present

## 2023-05-13 DIAGNOSIS — G4700 Insomnia, unspecified: Secondary | ICD-10-CM | POA: Diagnosis present

## 2023-05-13 DIAGNOSIS — S3210XD Unspecified fracture of sacrum, subsequent encounter for fracture with routine healing: Secondary | ICD-10-CM

## 2023-05-13 DIAGNOSIS — M009 Pyogenic arthritis, unspecified: Secondary | ICD-10-CM | POA: Diagnosis not present

## 2023-05-13 DIAGNOSIS — I428 Other cardiomyopathies: Secondary | ICD-10-CM

## 2023-05-13 DIAGNOSIS — Z800 Family history of malignant neoplasm of digestive organs: Secondary | ICD-10-CM

## 2023-05-13 DIAGNOSIS — D62 Acute posthemorrhagic anemia: Secondary | ICD-10-CM | POA: Diagnosis present

## 2023-05-13 DIAGNOSIS — T1490XA Injury, unspecified, initial encounter: Secondary | ICD-10-CM | POA: Diagnosis not present

## 2023-05-13 DIAGNOSIS — I110 Hypertensive heart disease with heart failure: Secondary | ICD-10-CM | POA: Diagnosis present

## 2023-05-13 DIAGNOSIS — T8149XA Infection following a procedure, other surgical site, initial encounter: Secondary | ICD-10-CM | POA: Diagnosis not present

## 2023-05-13 DIAGNOSIS — Z85828 Personal history of other malignant neoplasm of skin: Secondary | ICD-10-CM

## 2023-05-13 DIAGNOSIS — R195 Other fecal abnormalities: Secondary | ICD-10-CM | POA: Diagnosis not present

## 2023-05-13 DIAGNOSIS — S52021D Displaced fracture of olecranon process without intraarticular extension of right ulna, subsequent encounter for closed fracture with routine healing: Secondary | ICD-10-CM

## 2023-05-13 DIAGNOSIS — D75838 Other thrombocytosis: Secondary | ICD-10-CM | POA: Diagnosis present

## 2023-05-13 DIAGNOSIS — Z87891 Personal history of nicotine dependence: Secondary | ICD-10-CM

## 2023-05-13 DIAGNOSIS — S329XXA Fracture of unspecified parts of lumbosacral spine and pelvis, initial encounter for closed fracture: Secondary | ICD-10-CM | POA: Diagnosis present

## 2023-05-13 DIAGNOSIS — Z808 Family history of malignant neoplasm of other organs or systems: Secondary | ICD-10-CM | POA: Diagnosis not present

## 2023-05-13 DIAGNOSIS — R339 Retention of urine, unspecified: Secondary | ICD-10-CM | POA: Diagnosis present

## 2023-05-13 DIAGNOSIS — E119 Type 2 diabetes mellitus without complications: Secondary | ICD-10-CM | POA: Diagnosis present

## 2023-05-13 DIAGNOSIS — Z79899 Other long term (current) drug therapy: Secondary | ICD-10-CM

## 2023-05-13 DIAGNOSIS — F329 Major depressive disorder, single episode, unspecified: Secondary | ICD-10-CM | POA: Diagnosis present

## 2023-05-13 DIAGNOSIS — Z95810 Presence of automatic (implantable) cardiac defibrillator: Secondary | ICD-10-CM | POA: Diagnosis not present

## 2023-05-13 DIAGNOSIS — S32811D Multiple fractures of pelvis with unstable disruption of pelvic ring, subsequent encounter for fracture with routine healing: Principal | ICD-10-CM

## 2023-05-13 DIAGNOSIS — K5900 Constipation, unspecified: Secondary | ICD-10-CM | POA: Diagnosis present

## 2023-05-13 DIAGNOSIS — S51001D Unspecified open wound of right elbow, subsequent encounter: Secondary | ICD-10-CM | POA: Diagnosis not present

## 2023-05-13 DIAGNOSIS — K5901 Slow transit constipation: Secondary | ICD-10-CM | POA: Diagnosis not present

## 2023-05-13 DIAGNOSIS — S59909S Unspecified injury of unspecified elbow, sequela: Secondary | ICD-10-CM

## 2023-05-13 DIAGNOSIS — R0989 Other specified symptoms and signs involving the circulatory and respiratory systems: Secondary | ICD-10-CM | POA: Diagnosis not present

## 2023-05-13 DIAGNOSIS — I10 Essential (primary) hypertension: Secondary | ICD-10-CM | POA: Diagnosis not present

## 2023-05-13 DIAGNOSIS — S32810G Multiple fractures of pelvis with stable disruption of pelvic ring, subsequent encounter for fracture with delayed healing: Secondary | ICD-10-CM | POA: Diagnosis not present

## 2023-05-13 DIAGNOSIS — W11XXXD Fall on and from ladder, subsequent encounter: Secondary | ICD-10-CM | POA: Diagnosis present

## 2023-05-13 DIAGNOSIS — F5101 Primary insomnia: Secondary | ICD-10-CM | POA: Diagnosis not present

## 2023-05-13 DIAGNOSIS — F411 Generalized anxiety disorder: Secondary | ICD-10-CM | POA: Diagnosis present

## 2023-05-13 DIAGNOSIS — I420 Dilated cardiomyopathy: Secondary | ICD-10-CM | POA: Diagnosis present

## 2023-05-13 DIAGNOSIS — Z7982 Long term (current) use of aspirin: Secondary | ICD-10-CM | POA: Diagnosis not present

## 2023-05-13 DIAGNOSIS — D72829 Elevated white blood cell count, unspecified: Secondary | ICD-10-CM | POA: Diagnosis not present

## 2023-05-13 DIAGNOSIS — J029 Acute pharyngitis, unspecified: Secondary | ICD-10-CM | POA: Diagnosis not present

## 2023-05-13 DIAGNOSIS — I5022 Chronic systolic (congestive) heart failure: Secondary | ICD-10-CM | POA: Diagnosis present

## 2023-05-13 DIAGNOSIS — S2241XD Multiple fractures of ribs, right side, subsequent encounter for fracture with routine healing: Secondary | ICD-10-CM

## 2023-05-13 LAB — CBC WITH DIFFERENTIAL/PLATELET
Abs Immature Granulocytes: 0.15 10*3/uL — ABNORMAL HIGH (ref 0.00–0.07)
Basophils Absolute: 0.1 10*3/uL (ref 0.0–0.1)
Basophils Relative: 1 %
Eosinophils Absolute: 0.5 10*3/uL (ref 0.0–0.5)
Eosinophils Relative: 4 %
HCT: 33.3 % — ABNORMAL LOW (ref 39.0–52.0)
Hemoglobin: 10.4 g/dL — ABNORMAL LOW (ref 13.0–17.0)
Immature Granulocytes: 1 %
Lymphocytes Relative: 15 %
Lymphs Abs: 2 10*3/uL (ref 0.7–4.0)
MCH: 31 pg (ref 26.0–34.0)
MCHC: 31.2 g/dL (ref 30.0–36.0)
MCV: 99.1 fL (ref 80.0–100.0)
Monocytes Absolute: 1.1 10*3/uL — ABNORMAL HIGH (ref 0.1–1.0)
Monocytes Relative: 8 %
Neutro Abs: 9.1 10*3/uL — ABNORMAL HIGH (ref 1.7–7.7)
Neutrophils Relative %: 71 %
Platelets: 540 10*3/uL — ABNORMAL HIGH (ref 150–400)
RBC: 3.36 MIL/uL — ABNORMAL LOW (ref 4.22–5.81)
RDW: 17.5 % — ABNORMAL HIGH (ref 11.5–15.5)
WBC: 12.9 10*3/uL — ABNORMAL HIGH (ref 4.0–10.5)
nRBC: 0 % (ref 0.0–0.2)

## 2023-05-13 LAB — RENAL FUNCTION PANEL
Albumin: 2.2 g/dL — ABNORMAL LOW (ref 3.5–5.0)
Anion gap: 9 (ref 5–15)
BUN: 15 mg/dL (ref 8–23)
CO2: 25 mmol/L (ref 22–32)
Calcium: 9.1 mg/dL (ref 8.9–10.3)
Chloride: 101 mmol/L (ref 98–111)
Creatinine, Ser: 0.63 mg/dL (ref 0.61–1.24)
GFR, Estimated: >60 mL/min (ref >60)
Glucose, Bld: 140 mg/dL — ABNORMAL HIGH (ref 70–99)
Phosphorus: 3 mg/dL (ref 2.5–4.6)
Potassium: 4.4 mmol/L (ref 3.5–5.1)
Sodium: 135 mmol/L (ref 135–145)

## 2023-05-13 LAB — SEDIMENTATION RATE: Sed Rate: 117 mm/h — ABNORMAL HIGH (ref 0–16)

## 2023-05-13 LAB — MAGNESIUM: Magnesium: 1.8 mg/dL (ref 1.7–2.4)

## 2023-05-13 LAB — C-REACTIVE PROTEIN: CRP: 8.8 mg/dL — ABNORMAL HIGH (ref <1.0)

## 2023-05-13 MED ORDER — LORATADINE 10 MG PO TABS
10.0000 mg | ORAL_TABLET | Freq: Every evening | ORAL | Status: DC
  Administered 2023-05-13 – 2023-05-31 (×19): 10 mg via ORAL
  Filled 2023-05-13 (×19): qty 1

## 2023-05-13 MED ORDER — DOXYCYCLINE HYCLATE 100 MG PO TABS
100.0000 mg | ORAL_TABLET | Freq: Two times a day (BID) | ORAL | Status: DC
Start: 2023-05-13 — End: 2023-05-28
  Administered 2023-05-13 – 2023-05-28 (×30): 100 mg via ORAL
  Filled 2023-05-13 (×31): qty 1

## 2023-05-13 MED ORDER — FLEET ENEMA RE ENEM: 1.0000 | ENEMA | Freq: Once | RECTAL | Status: DC | PRN

## 2023-05-13 MED ORDER — MELATONIN 3 MG PO TABS
3.0000 mg | ORAL_TABLET | Freq: Every day | ORAL | Status: DC
  Administered 2023-05-13 – 2023-05-15 (×3): 3 mg via ORAL
  Filled 2023-05-13 (×3): qty 1

## 2023-05-13 MED ORDER — LIDOCAINE HCL URETHRAL/MUCOSAL 2 % EX GEL
CUTANEOUS | Status: DC | PRN
  Filled 2023-05-13 (×2): qty 6

## 2023-05-13 MED ORDER — FUROSEMIDE 20 MG PO TABS
20.0000 mg | ORAL_TABLET | Freq: Two times a day (BID) | ORAL | Status: DC
  Administered 2023-05-13 – 2023-06-01 (×38): 20 mg via ORAL
  Filled 2023-05-13 (×38): qty 1

## 2023-05-13 MED ORDER — ACETAMINOPHEN 325 MG PO TABS
325.0000 mg | ORAL_TABLET | ORAL | Status: DC | PRN
  Administered 2023-05-14 – 2023-05-29 (×4): 650 mg via ORAL
  Filled 2023-05-13 (×3): qty 2

## 2023-05-13 MED ORDER — CHLORHEXIDINE GLUCONATE CLOTH 2 % EX PADS: 6.0000 | MEDICATED_PAD | Freq: Every day | CUTANEOUS | Status: DC

## 2023-05-13 MED ORDER — GUAIFENESIN-DM 100-10 MG/5ML PO SYRP
5.0000 mL | ORAL_SOLUTION | Freq: Four times a day (QID) | ORAL | Status: DC | PRN
  Filled 2023-05-13: qty 10

## 2023-05-13 MED ORDER — EZETIMIBE 10 MG PO TABS
10.0000 mg | ORAL_TABLET | Freq: Every day | ORAL | Status: DC
  Administered 2023-05-14 – 2023-06-01 (×19): 10 mg via ORAL
  Filled 2023-05-13 (×19): qty 1

## 2023-05-13 MED ORDER — ZINC SULFATE 220 (50 ZN) MG PO CAPS
220.0000 mg | ORAL_CAPSULE | Freq: Every day | ORAL | Status: DC
  Administered 2023-05-13 – 2023-05-31 (×19): 220 mg via ORAL
  Filled 2023-05-13 (×20): qty 1

## 2023-05-13 MED ORDER — PANTOPRAZOLE SODIUM 40 MG PO TBEC
40.0000 mg | DELAYED_RELEASE_TABLET | Freq: Every day | ORAL | Status: DC
  Administered 2023-05-14 – 2023-06-01 (×15): 40 mg via ORAL
  Filled 2023-05-13 (×18): qty 1

## 2023-05-13 MED ORDER — ACETAMINOPHEN 325 MG PO TABS
650.0000 mg | ORAL_TABLET | Freq: Four times a day (QID) | ORAL | Status: DC
  Administered 2023-05-13 – 2023-06-01 (×72): 650 mg via ORAL
  Filled 2023-05-13 (×74): qty 2

## 2023-05-13 MED ORDER — ORAL CARE MOUTH RINSE: 15.0000 mL | OROMUCOSAL | Status: DC | PRN

## 2023-05-13 MED ORDER — PROCHLORPERAZINE MALEATE 5 MG PO TABS: 5.0000 mg | ORAL_TABLET | Freq: Four times a day (QID) | ORAL | Status: DC | PRN

## 2023-05-13 MED ORDER — SPIRONOLACTONE 25 MG PO TABS
25.0000 mg | ORAL_TABLET | Freq: Every day | ORAL | Status: DC
  Administered 2023-05-14 – 2023-06-01 (×19): 25 mg via ORAL
  Filled 2023-05-13 (×19): qty 1

## 2023-05-13 MED ORDER — POLYETHYLENE GLYCOL 3350 17 G PO PACK: 17.0000 g | PACK | Freq: Every day | ORAL | Status: DC | PRN

## 2023-05-13 MED ORDER — JUVEN PO PACK
1.0000 | PACK | Freq: Two times a day (BID) | ORAL | Status: DC
  Administered 2023-05-14 – 2023-06-01 (×28): 1 via ORAL
  Filled 2023-05-13 (×23): qty 1

## 2023-05-13 MED ORDER — ENOXAPARIN SODIUM 30 MG/0.3ML IJ SOSY
30.0000 mg | PREFILLED_SYRINGE | Freq: Two times a day (BID) | INTRAMUSCULAR | Status: DC
  Administered 2023-05-13 – 2023-06-01 (×38): 30 mg via SUBCUTANEOUS
  Filled 2023-05-13 (×38): qty 0.3

## 2023-05-13 MED ORDER — BISACODYL 10 MG RE SUPP: 10.0000 mg | Freq: Every day | RECTAL | Status: DC | PRN

## 2023-05-13 MED ORDER — PROCHLORPERAZINE 25 MG RE SUPP: 12.5000 mg | Freq: Four times a day (QID) | RECTAL | Status: DC | PRN

## 2023-05-13 MED ORDER — POTASSIUM CHLORIDE CRYS ER 20 MEQ PO TBCR
40.0000 meq | EXTENDED_RELEASE_TABLET | Freq: Two times a day (BID) | ORAL | Status: DC
Start: 2023-05-13 — End: 2023-05-13

## 2023-05-13 MED ORDER — VITAMIN D 25 MCG (1000 UNIT) PO TABS
2000.0000 [IU] | ORAL_TABLET | Freq: Every day | ORAL | Status: DC
  Administered 2023-05-14 – 2023-06-01 (×19): 2000 [IU] via ORAL
  Filled 2023-05-13 (×19): qty 2

## 2023-05-13 MED ORDER — LORAZEPAM 0.5 MG PO TABS
0.5000 mg | ORAL_TABLET | Freq: Every day | ORAL | Status: DC
  Administered 2023-05-13 – 2023-05-25 (×13): 0.5 mg via ORAL
  Filled 2023-05-13 (×13): qty 1

## 2023-05-13 MED ORDER — QUETIAPINE FUMARATE 50 MG PO TABS
50.0000 mg | ORAL_TABLET | Freq: Every evening | ORAL | Status: DC | PRN
  Administered 2023-05-13 – 2023-05-17 (×3): 50 mg via ORAL
  Filled 2023-05-13 (×4): qty 1

## 2023-05-13 MED ORDER — CARVEDILOL 25 MG PO TABS
25.0000 mg | ORAL_TABLET | Freq: Two times a day (BID) | ORAL | Status: DC
Start: 2023-05-13 — End: 2023-06-01
  Administered 2023-05-13 – 2023-06-01 (×38): 25 mg via ORAL
  Filled 2023-05-13 (×39): qty 1

## 2023-05-13 MED ORDER — ALBUTEROL SULFATE (2.5 MG/3ML) 0.083% IN NEBU
3.0000 mL | INHALATION_SOLUTION | Freq: Four times a day (QID) | RESPIRATORY_TRACT | Status: DC | PRN
  Administered 2023-05-18 – 2023-05-28 (×2): 3 mL via RESPIRATORY_TRACT
  Filled 2023-05-13 (×2): qty 3

## 2023-05-13 MED ORDER — DIPHENHYDRAMINE HCL 25 MG PO CAPS: 25.0000 mg | ORAL_CAPSULE | Freq: Four times a day (QID) | ORAL | Status: DC | PRN

## 2023-05-13 MED ORDER — OXYCODONE HCL 5 MG PO TABS
5.0000 mg | ORAL_TABLET | ORAL | Status: DC | PRN
  Administered 2023-05-13 – 2023-05-15 (×9): 5 mg via ORAL
  Filled 2023-05-13 (×11): qty 1

## 2023-05-13 MED ORDER — POTASSIUM CHLORIDE CRYS ER 20 MEQ PO TBCR
20.0000 meq | EXTENDED_RELEASE_TABLET | Freq: Two times a day (BID) | ORAL | Status: DC
Start: 2023-05-13 — End: 2023-06-01
  Administered 2023-05-13 – 2023-06-01 (×38): 20 meq via ORAL
  Filled 2023-05-13 (×38): qty 1

## 2023-05-13 MED ORDER — TAMSULOSIN HCL 0.4 MG PO CAPS
0.8000 mg | ORAL_CAPSULE | Freq: Every day | ORAL | Status: DC
  Administered 2023-05-13 – 2023-05-31 (×19): 0.8 mg via ORAL
  Filled 2023-05-13 (×19): qty 2

## 2023-05-13 MED ORDER — ALUM & MAG HYDROXIDE-SIMETH 200-200-20 MG/5ML PO SUSP: 30.0000 mL | ORAL | Status: DC | PRN

## 2023-05-13 MED ORDER — MELATONIN 5 MG PO TABS: 5.0000 mg | ORAL_TABLET | Freq: Every evening | ORAL | Status: DC | PRN

## 2023-05-13 MED ORDER — METOPROLOL TARTRATE 5 MG/5ML IV SOLN: 5.0000 mg | Freq: Four times a day (QID) | INTRAVENOUS | Status: DC | PRN

## 2023-05-13 MED ORDER — ADULT MULTIVITAMIN W/MINERALS CH: 1.0000 | ORAL_TABLET | Freq: Every day | ORAL | Status: DC

## 2023-05-13 MED ORDER — VITAMIN C 500 MG PO TABS
500.0000 mg | ORAL_TABLET | Freq: Every day | ORAL | Status: DC
  Administered 2023-05-13 – 2023-06-01 (×20): 500 mg via ORAL
  Filled 2023-05-13 (×20): qty 1

## 2023-05-13 MED ORDER — ADULT MULTIVITAMIN W/MINERALS CH
1.0000 | ORAL_TABLET | ORAL | Status: DC
  Administered 2023-05-14 – 2023-05-31 (×17): 1 via ORAL
  Filled 2023-05-13 (×18): qty 1

## 2023-05-13 MED ORDER — METHOCARBAMOL 500 MG PO TABS
1000.0000 mg | ORAL_TABLET | Freq: Three times a day (TID) | ORAL | Status: DC
  Administered 2023-05-13 – 2023-05-25 (×35): 1000 mg via ORAL
  Filled 2023-05-13 (×35): qty 2

## 2023-05-13 MED ORDER — PRAVASTATIN SODIUM 40 MG PO TABS
80.0000 mg | ORAL_TABLET | Freq: Every day | ORAL | Status: DC
  Administered 2023-05-14 – 2023-06-01 (×19): 80 mg via ORAL
  Filled 2023-05-13 (×19): qty 2

## 2023-05-13 MED ORDER — PROCHLORPERAZINE EDISYLATE 10 MG/2ML IJ SOLN: 5.0000 mg | Freq: Four times a day (QID) | INTRAMUSCULAR | Status: DC | PRN

## 2023-05-13 NOTE — Progress Notes (Signed)
Inpatient Rehabilitation Admissions Coordinator   I have insurance approval and CIR bed to admit him to today. Trauma PA, Genevive Bi made aware. I contacted his wife by phone and she is in agreement. I will meet with patient to make the arrangements to admit today. Acute team and TOC made aware.  Ottie Glazier, RN, MSN Rehab Admissions Coordinator [phone] 05/13/2023 9:58 AM

## 2023-05-13 NOTE — H&P (Addendum)
Physical Medicine and Rehabilitation Admission H&P    Chief Complaint  Patient presents with   Functional deficits due to polytrauma after fall from a ladder    HPI:  Louis Juarez is an 82 year old male with history of T2DM, NICM, HTN, ICM/CHF s/p AICD who was admitted on 04/24/23 after falling 20 feed off a ladder with complaints of right elbow, right hip pain and was hypotensive in ED. He was found to have unstable pelvic ring fracture with sacral Fx on left, posterior ilium and superior pubic rami fracture on right, right elbow laceration with air in ulnohumeral joint and small comminuted fracture fragments of olecranon tip, right 6-12 rib fractures, right T10 transverse process fracture, hemorrhagic shock, age-indeterminate mild compression fractures of T4, T7 and T12 as well as incidental findings of pulmonary nodules and severe atherosclerotic calcification left carotid bulb and proximal left cervical ICA.  Dr. Dutch Quint evaluated the films and felt that no brace or spine precautions needed.Marland Kitchen  He was taken to OR the same day for percutaneous fixation of right posterior pelvic fractur fracture, left sacrum and right superior pubic rami fracture, open treatment of right olecranon avulsion fracture, I&D right open olecranon fracture and I&D of her right open elbow arthrotomy by Dr. Jena Gauss.  Postop had issues with hypotension, acute blood loss anemia requiring transfusion, urinary retention requiring Foley placement briefly as well as development of hypoxia with decrease in level of consciousness on 10/07.  Head CT was negative for acute changes albumin bolus for hypotension as well as Levophed.Marland Kitchen  He reported worsening of back pain and flank pain bilaterally as well as hematuria and foley replaced.  CT abdomen pelvis repeated on 10/12 due to rising white count as well as abdominal pain and hematuria.  This revealed thickening of descending and proximal sigmoid colon compatible with acute colitis,  increased displacement of right iliac wing fracture, increased moderate right pleural effusions as well as groundglass opacities BLE and patchy consolidation in right base.  Incidental note made of 2.7 cm hemangioma left lobe of liver as well as subcutaneous edema diffusely right greater than left.  He was started on IV Zosyn on 10/13 for possible colitis as well as pulmonary toilet.  Fluid overload treated with Lasix. He was taken back to OR for ORIF pubic symphysis with right superior pubic rami's, ORIF fixation of right posterior pelvic/SI joint and I&D of right elbow infection on 10/18.  Postop to be strict NWB RLE and WBAT LLE for transfers only.  He is WBAT RUE.  ST elevation with electrolyte abnormalities being monitored.  He was maintained on IV antibiotics through 10/22.  He continues to have drainage from right elbow with recommendations to continue antibiotics..  Mentation has improved and leukocytosis is resolving with white count trending down. He is continues to require mod assist with difficulty remembering WB status. CIR recommended due to functional decline. C/o weakness.     Review of Systems  Constitutional:  Negative for chills and fever.       Reports feeling hot at nights  HENT:  Positive for hearing loss and tinnitus.   Eyes:  Negative for blurred vision.  Respiratory:  Positive for wheezing. Negative for cough and shortness of breath.   Cardiovascular:  Negative for chest pain and palpitations.  Gastrointestinal:  Negative for constipation and heartburn.  Genitourinary:  Negative for dysuria and urgency.  Musculoskeletal:  Negative for myalgias.  Skin:  Negative for rash.  Neurological:  Positive  for weakness. Negative for dizziness and headaches.  Psychiatric/Behavioral:  The patient has insomnia.     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 Bilateral pleural effusions,  Question mild subpleural reticulation, indicative of fibrosis.  Coronary artery calcification. 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 Wall thickness was increased in a pattern of mild LVH.- Aortic valve: AV is thckened, calcified with no signifi stenosis.The atrium was severely dilated.: Systolic function was moderately reduced.The atrium was mildly dilated.PA peak pre   Nonischemic cardiomyopathy (HCC)    Palpitations    Pneumonia    PVC (premature ventricular contraction) 07/02/2011   PVC's (premature ventricular contractions)    Wheezing 04/29/2011   Sinus ct 05/2011:    Wheezing     Past Surgical History:  Procedure Laterality Date   BI-VENTRICULAR IMPLANTABLE CARDIOVERTER DEFIBRILLATOR N/A 02/13/2012   Procedure: BI-VENTRICULAR IMPLANTABLE CARDIOVERTER DEFIBRILLATOR  (CRT-D);  Surgeon: Hillis Range, MD;   Location[facility] CATH LAB;  Service: Cardiovascular;  Laterality: N/A;   BI-VENTRICULAR IMPLANTABLE CARDIOVERTER DEFIBRILLATOR  (CRT-D)  02/13/2012   BI-VENTRICULAR IMPLANTABLE CARDIOVERTER DEFIBRILLATOR  (CRT-D)  05/03/2019   CHANGEOUT   BIV ICD GENERATOR CHANGEOUT N/A 05/03/2019   Procedure: BIV ICD GENERATOR CHANGEOUT;  Surgeon: Hillis Range, MD;  Location: [facility] INVASIVE CV LAB;  Service: Cardiovascular;  Laterality: N/A;   CARDIAC CATHETERIZATION  2013   CARDIAC DEFIBRILLATOR PLACEMENT  02/13/2012   SJM [employer] BIV ICD implanted by Dr Johney Frame   IRRIGATION AND DEBRIDEMENT ELBOW Right 04/24/2023   Procedure: IRRIGATION AND DEBRIDEMENT ELBOW;  Surgeon: Roby Lofts, MD;  Location: [facility] OR;  Service: Orthopedics;  Laterality: Right;   IRRIGATION AND DEBRIDEMENT ELBOW Right 05/08/2023   Procedure: IRRIGATION AND DEBRIDEMENT ELBOW;  Surgeon: Roby Lofts, MD;  Location: [facility] OR;  Service: Orthopedics;  Laterality: Right;   NASAL SINUS SURGERY  93, 97, 2010, 2006   NASAL SINUS SURGERY     1993, 1997, 2006, 2010   ORIF PELVIC FRACTURE Right 05/08/2023   Procedure: OPEN REDUCTION INTERNAL FIXATION (ORIF) PELVIC FRACTURE;  Surgeon: Roby Lofts, MD;  Location: [facility] OR;  Service: Orthopedics;  Laterality: Right;   ORIF PELVIC FRACTURE WITH PERCUTANEOUS SCREWS Bilateral 04/24/2023   Procedure: ORIF PELVIC FRACTURE WITH PERCUTANEOUS  SCREWS;  Surgeon: Roby Lofts, MD;  Location: [facility] OR;  Service: Orthopedics;  Laterality: Bilateral;    Family History  Problem Relation Age of Onset   Colon cancer Father    Melanoma Mother    Melanoma Sister      Social History:  Married. Independent and active PTA. 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 He reports that he has quit smoking. His smoking use included cigarettes. He has never used smokeless tobacco. No history on file for alcohol use and drug use.   Allergies  Allergen Reactions   Morphine Other (See Comments)     Agitation/hallucinations    Medications Prior to Admission  Medication Sig Dispense Refill   albuterol (VENTOLIN HFA) 108 (90 Base) MCG/ACT inhaler INHALE 2 PUFFS INTO THE LUNGS EVERY 6 HOURS     albuterol (VENTOLIN HFA) 108 (90 Base) MCG/ACT inhaler Inhale 2 puffs into the lungs every 6 (six) hours as needed.     amitriptyline (ELAVIL) 100 MG tablet Take 100 mg by mouth at bedtime.       amitriptyline (ELAVIL) 100 MG tablet Take 100 mg by mouth at bedtime.     aspirin 81 MG tablet Take 81 mg by mouth daily.       carvedilol (COREG) 25 MG tablet TAKE 1 TABLET (25 MG TOTAL) BY MOUTH 2 (TWO) TIMES DAILY WITH A MEAL. 180 tablet 3   carvedilol (COREG) 25 MG tablet Take 25 mg by mouth 2 (two) times daily.     colesevelam (WELCHOL) 625 MG tablet Take 1,875 mg by mouth 2 (two) times daily.      ezetimibe (ZETIA) 10 MG tablet Take 10 mg by mouth at bedtime.      ezetimibe (ZETIA) 10 MG tablet Take 10 mg by mouth daily.     fluorouracil (EFUDEX) 5 % cream Apply topically 2 (two) times a week.     furosemide (LASIX) 20 MG tablet Take 1 tablet (20 mg total) by mouth 2 (two) times daily. 180 tablet 1   furosemide (LASIX) 20 MG tablet Take 20 mg by mouth 2 (two) times daily.     ketoconazole (NIZORAL) 2 % cream Apply topically.     levocetirizine (XYZAL) 5 MG tablet SMARTSIG:1 Tablet(s) By Mouth Every Evening     levocetirizine (XYZAL) 5 MG tablet Take 5 mg by mouth every evening.     LORazepam (ATIVAN) 0.5 MG tablet Take 0.5 mg by mouth daily.     LORazepam (ATIVAN) 1 MG tablet Take 1 mg by mouth 2 (two) times daily.     losartan (COZAAR) 100 MG tablet TAKE 1 TABLET BY MOUTH IN THE AM AND 1/2 TABLET IN THE PM needs appt 135 tablet 1   losartan (COZAAR) 100 MG tablet Take 100 mg by mouth daily.     mirabegron ER (MYRBETRIQ) 50 MG TB24 tablet Take 50 mg by mouth daily.      Multiple Vitamin (MULTIVITAMIN) capsule Take 1 capsule by mouth daily.      pantoprazole (PROTONIX) 20 MG tablet Take 20 mg by  mouth daily.     pravastatin (PRAVACHOL) 80 MG tablet Take 80 mg by mouth at bedtime.      pravastatin (PRAVACHOL) 80 MG tablet Take 80 mg by mouth daily.     spironolactone (ALDACTONE) 25 MG tablet Take 1 tablet (25 mg total) by mouth daily. 90 tablet 1   spironolactone (ALDACTONE) 25 MG tablet Take 25 mg by mouth daily.  tamsulosin (FLOMAX) 0.4 MG CAPS capsule Take 0.4 mg by mouth daily.     tamsulosin (FLOMAX) 0.4 MG CAPS capsule Take 0.4 mg by mouth at bedtime.     zolpidem (AMBIEN) 10 MG tablet Take 10 mg by mouth at bedtime.     zolpidem (AMBIEN) 10 MG tablet Take 10 mg by mouth at bedtime as needed.     Home: Home Living Family/patient expects to be discharged to:: Private residence Living Arrangements: Spouse/significant other Available Help at Discharge: Family, Available 24 hours/day Type of Home: House Home Access: Stairs to enter [employer] of Steps: 8 Entrance Stairs-Rails: Can reach both Home Layout: Able to live on main level with bedroom/bathroom Alternate Level Stairs-Number of Steps: does not go upstairs` Bathroom Shower/Tub: Health visitor: Standard (also has toilet seat) Bathroom Accessibility: Yes Home Equipment: None  Lives With: Spouse   Functional History: Prior Function Prior Level of Function : Independent/Modified Independent, Driving   Functional Status:  Mobility: Bed Mobility Overal bed mobility: Needs Assistance Bed Mobility: Rolling Rolling: Mod assist, Used rails Sidelying to sit: Max assist, HOB elevated, Used rails Supine to sit: Max assist Sit to supine: Mod assist General bed mobility comments: patient perfomred rolling in bed to allow for cleaning after bowel inconenance Transfers Overall transfer level: Needs assistance Equipment used: Ambulation equipment used Transfers: Bed to chair/wheelchair/BSC, Sit to/from Stand Sit to Stand: Max assist Bed to/from chair/wheelchair/BSC transfer type:: Via Lift  equipment  Lateral/Scoot Transfers: Mod assist, +2 physical assistance, +2 safety/equipment, With slide board Transfer via Lift Equipment: Stedy General transfer comment: not attempted due to patient stating increased pain and fatigue following performing bed mobility Ambulation/Gait General Gait Details: transfers only   ADL: ADL Overall ADL's : Needs assistance/impaired Eating/Feeding: Set up, Sitting Grooming: Wash/dry hands, Wash/dry face, Set up Grooming Details (indicate cue type and reason): shaving face with electric razor then blade razor . Upper Body Bathing: Minimal assistance, Sitting Lower Body Bathing: Maximal assistance, Total assistance, Bed level Lower Body Bathing Details (indicate cue type and reason): patient performed rolling in bed for peri area bathing front and back due to bowel incontenance Upper Body Dressing : Minimal assistance, Bed level Upper Body Dressing Details (indicate cue type and reason): gown change Lower Body Dressing: Total assistance, Maximal assistance, Sit to/from stand, Sitting/lateral leans Lower Body Dressing Details (indicate cue type and reason): to donn socks Toilet Transfer: Moderate assistance, +2 for safety/equipment, +2 for physical assistance Toilet Transfer Details (indicate cue type and reason): up in chair upon arrival Toileting- Clothing Manipulation and Hygiene: Total assistance, Sitting/lateral lean, Sit to/from stand Toileting - Clothing Manipulation Details (indicate cue type and reason): unable to complete peri-care in standing Functional mobility during ADLs: Moderate assistance, Cueing for sequencing, Cueing for safety, +2 for physical assistance, +2 for safety/equipment General ADL Comments: Patient wit need for re-eval after surgery on 10/18 for ORIF of pelvis and I&D of elbow. Continuesd difficulty recalling precautions. OT speaking with wife and patient to promote use of slide board or scooting back to bed as patient isnt  fully cognizant of the weight he is putting through his leg with the Stedy. OT continues to recommend intensive rehab > 3hours. OT will follow.   Cognition: Cognition Overall Cognitive Status: Impaired/Different from baseline Orientation Level: Oriented X4 Cognition Arousal: Alert Behavior During Therapy: WFL for tasks assessed/performed Overall Cognitive Status: Impaired/Different from baseline Area of Impairment: Memory, Following commands, Awareness, Problem solving Orientation Level: Disoriented to, Situation Memory: Decreased recall of  precautions, Decreased short-term memory Following Commands: Follows one step commands inconsistently, Follows one step commands with increased time Safety/Judgement: Decreased awareness of deficits Awareness: Intellectual Problem Solving: Slow processing, Requires verbal cues, Requires tactile cues, Difficulty sequencing General Comments: unaware he had soiled bed    Physical Exam: Height 5\' 10"  (1.778 m), weight 82.5 kg. Gen: no distress, normal appearing HEENT: oral mucosa pink and moist, NCAT Cardiovascular:     Comments: Audible wheezing with upper airway congestion.  No chest pain or cough reported Pulmonary: upper airway congestion Abd: NT, ND GU: soiled bed Musculoskeletal:     Comments: Noted around left elbow laceration.  Incision erythematous and when elbow lifted and rotated inward; drains synovial fluid mixed that changes to purulent drainage from medial aspect.  Prepubic and right lower quadrant incisions C/D/I skin place.  Moderate noted edema RLQ/flank.  Neurological:     Mental Status: He is oriented to person, place, and time.   Results for orders placed or performed during the hospital encounter of 05/13/23 (from the past 48 hour(s))  CBC with Differential/Platelet     Status: Abnormal   Collection Time: 05/13/23  6:19 PM  Result Value Ref Range   WBC 12.9 (H) 4.0 - 10.5 K/uL   RBC 3.36 (L) 4.22 - 5.81 MIL/uL   Hemoglobin  10.4 (L) 13.0 - 17.0 g/dL   HCT 08.6 (L) 57.8 - 46.9 %   MCV 99.1 80.0 - 100.0 fL   MCH 31.0 26.0 - 34.0 pg   MCHC 31.2 30.0 - 36.0 g/dL   RDW 62.9 (H) 52.8 - 41.3 %   Platelets 540 (H) 150 - 400 K/uL   nRBC 0.0 0.0 - 0.2 %   Neutrophils Relative % 71 %   Neutro Abs 9.1 (H) 1.7 - 7.7 K/uL   Lymphocytes Relative 15 %   Lymphs Abs 2.0 0.7 - 4.0 K/uL   Monocytes Relative 8 %   Monocytes Absolute 1.1 (H) 0.1 - 1.0 K/uL   Eosinophils Relative 4 %   Eosinophils Absolute 0.5 0.0 - 0.5 K/uL   Basophils Relative 1 %   Basophils Absolute 0.1 0.0 - 0.1 K/uL   Immature Granulocytes 1 %   Abs Immature Granulocytes 0.15 (H) 0.00 - 0.07 K/uL    Comment: Performed at [facility], [address] [place], [place] [address]  C-reactive protein     Status: Abnormal   Collection Time: 05/13/23  6:19 PM  Result Value Ref Range   CRP 8.8 (H) <1.0 mg/dL    Comment: Performed at [facility], [address], [place], [place] [address]   No results found.    Height 5\' 10"  (1.778 m), weight 82.5 kg.  Medical Problem List and Plan: 1. Functional deficits secondary to polytrauma  -patient may shower  -ELOS/Goals: 14-20 days S  Admit to CIR 2.  Antithrombotics: -DVT/anticoagulation:  Pharmaceutical: Lovenox  -antiplatelet therapy: N/AA 3. Pain Management: continue Oxycodone as needed severe pain 4. Mood/Behavior/Sleep: LCSW to follow for evaluation and support  -antipsychotic agents: Seroquel as needed 5. Neuropsych/cognition: This patient is  capable of making decisions on his own behalf. 6. Skin/Wound Care: Routine pressure-relief measures.   --Monitor right elbow for drainage/healing. 7. Fluids/Electrolytes/Nutrition: Monitor I/os.  Continue Ensure supplements.  -- Add vitamin C, zinc and Juven. 8. Pelvic ring fracture s/p percutaneous fixation: Strict NWB RLE and WBAT LLE for transfers only. 9. Right elbow wound drainage: Now on doxycycline.  Reports night sweats.  Cultures  sent  10. Acute blood loss anemia: Monitor for any signs of bleeding.  Recheck CBC in AM. 11. Leukocytosis: White count trending down from 23.6--> 17.3. 12.  Thrombocytosis: Likely reactive.  Continue to monitor 13. Low protein stores: Albumin down to 2.2--supplements added. 14. H/o NICM/HF s/p ICD:  Daily weights/monitor for signs of overload. Change diet to heart healthy -- On Coreg, aldactone, Lasix and Pravachol.  --decrease Kdur to 20 meq bid as K trending up.  --Repeat chest x-ray in a.m. 15. Multiple rib fractures: Encourage pulmonary hygiene. Incentive spirometer ordered  16. Urinary retention: Monitor voiding with PVR checks.  continue Flomax.  I have personally performed a face to face diagnostic evaluation, including, but not limited to relevant history and physical exam findings, of this patient and developed relevant assessment and plan.  Additionally, I have reviewed and concur with the physician assistant's documentation above.  Jacquelynn Cree, PA-C   Horton Chin, MD 05/13/2023

## 2023-05-13 NOTE — Plan of Care (Signed)

## 2023-05-13 NOTE — Plan of Care (Signed)

## 2023-05-13 NOTE — Progress Notes (Signed)
Occupational Therapy Treatment Patient Details Name: Louis Juarez MRN: [id number] DOB: September 15, 1940 Today's Date: 05/13/2023   History of present illness 82 y.o. male presents to [facility] on 04/24/2023 after fall from ladder. Pt found to have open book pelvic fx, open R olceranon fx, R rib 6-12 fx, R T10 TP fx, and age-indeterminate mild compression fxs of T4, T7 and T12. Pt underwent percutaneous fixation of pelvic fxs and open treatment of R olecranon fx on 10/4. Underwent ORIF R pelvis and I&D R elbow 10/18. PMH includes heart failure, ICD.   OT comments  Patient received in supine and agreeable to OT. Patient began attempting to get to EOB and was discovered to have soiled bed from bowel incontinence and patient unaware. Patient assisted with rolling side to side for cleaning with max to total assist. Patient stating fatigue and increased pain and declined further therapy. Patient will benefit from intensive inpatient follow up therapy, >3 hours/day.      If plan is discharge home, recommend the following:  Two people to help with walking and/or transfers;A lot of help with bathing/dressing/bathroom;Assistance with cooking/housework;Assist for transportation;Help with stairs or ramp for entrance;Supervision due to cognitive status   Equipment Recommendations  Wheelchair (measurements OT);Wheelchair cushion (measurements OT);Hospital bed    Recommendations for Other Services      Precautions / Restrictions Precautions Precautions: Fall Restrictions Weight Bearing Restrictions: Yes RUE Weight Bearing: Weight bearing as tolerated RLE Weight Bearing: Non weight bearing LLE Weight Bearing: Weight bearing as tolerated (for transfers only) Other Position/Activity Restrictions: LLE WBAT for transfers only       Mobility Bed Mobility Overal bed mobility: Needs Assistance Bed Mobility: Rolling Rolling: Mod assist, Used rails         General bed mobility comments: patient  perfomred rolling in bed to allow for cleaning after bowel inconenance    Transfers Overall transfer level: Needs assistance                 General transfer comment: not attempted due to patient stating increased pain and fatigue following performing bed mobility     Balance                                           ADL either performed or assessed with clinical judgement   ADL Overall ADL's : Needs assistance/impaired     Grooming: Wash/dry hands;Wash/dry face;Set up       Lower Body Bathing: Maximal assistance;Total assistance;Bed level Lower Body Bathing Details (indicate cue type and reason): patient performed rolling in bed for peri area bathing front and back due to bowel incontenance Upper Body Dressing : Minimal assistance;Bed level Upper Body Dressing Details (indicate cue type and reason): gown change                        Extremity/Trunk Assessment              Vision       Perception     Praxis      Cognition Arousal: Alert Behavior During Therapy: WFL for tasks assessed/performed Overall Cognitive Status: Impaired/Different from baseline Area of Impairment: Memory, Following commands, Awareness, Problem solving                     Memory: Decreased recall of precautions, Decreased short-term memory Following Commands: Follows one  step commands inconsistently, Follows one step commands with increased time Safety/Judgement: Decreased awareness of deficits Awareness: Intellectual Problem Solving: Slow processing, Requires verbal cues, Requires tactile cues, Difficulty sequencing General Comments: unaware he had soiled bed        Exercises      Shoulder Instructions       General Comments VSS on RA    Pertinent Vitals/ Pain       Pain Assessment Pain Assessment: Faces Faces Pain Scale: Hurts little more Pain Location: R pelvis following bed mobility Pain Descriptors / Indicators:  Grimacing Pain Intervention(s): Limited activity within patient's tolerance, Monitored during session, Patient requesting pain meds-RN notified  Home Living Family/patient expects to be discharged to:: Private residence Living Arrangements: Spouse/significant other                                      Prior Functioning/Environment              Frequency  Min 1X/week        Progress Toward Goals  OT Goals(current goals can now be found in the care plan section)  Progress towards OT goals: Progressing toward goals  Acute Rehab OT Goals Patient Stated Goal: go to rehab OT Goal Formulation: With patient/family Time For Goal Achievement: 05/25/23 Potential to Achieve Goals: Good ADL Goals Pt Will Perform Grooming: Independently;sitting Pt Will Perform Lower Body Bathing: with mod assist;sit to/from stand;sitting/lateral leans Pt Will Perform Lower Body Dressing: with mod assist;sit to/from stand;sitting/lateral leans Pt Will Transfer to Toilet: with mod assist;bedside commode;stand pivot transfer Pt Will Perform Toileting - Clothing Manipulation and hygiene: with mod assist;sitting/lateral leans;sit to/from stand Additional ADL Goal #2: Patient will be able to complete bed mobility at min A level as a precursor to OOB activities.  Plan      Co-evaluation                 AM-PAC OT "6 Clicks" Daily Activity     Outcome Measure   Help from another person eating meals?: A Little Help from another person taking care of personal grooming?: A Little Help from another person toileting, which includes using toliet, bedpan, or urinal?: A Lot Help from another person bathing (including washing, rinsing, drying)?: A Lot Help from another person to put on and taking off regular upper body clothing?: A Lot Help from another person to put on and taking off regular lower body clothing?: Total 6 Click Score: 13    End of Session    OT Visit Diagnosis:  Unsteadiness on feet (R26.81);Muscle weakness (generalized) (M62.81) Pain - Right/Left: Right Pain - part of body: Hip   Activity Tolerance Patient tolerated treatment well   Patient Left in bed;with call bell/phone within reach;with bed alarm set;with family/visitor present   Nurse Communication Mobility status;Patient requests pain meds        Time: 5284-1324 OT Time Calculation (min): 23 min  Charges: OT General Charges $OT Visit: 1 Visit OT Treatments $Self Care/Home Management : 23-37 mins  Alfonse Flavors, OTA Acute Rehabilitation Services  Office [phone]   Dewain Penning 05/13/2023, 1:07 PM

## 2023-05-13 NOTE — Progress Notes (Signed)
Louis Chin, MD  Physician Physical Medicine and Rehabilitation   PMR Pre-admission    Signed   Date of Service: 04/27/2023 12:23 PM  Related encounter: ED to Hosp-Admission (Discharged) from 04/24/2023 in [facility]   Signed     Expand All Collapse All  Show:Clear all [x] Written[x] Templated[x] Copied  Added by: [x] Loretta Doutt, Tye [place], RN[x] Theodoro Kos, Lauren P, CCC-SLP[x] Raulkar, Drema Pry, MD[x] Jeronimo Greaves, CCC-SLP  [] Hover for details PMR Admission Coordinator Pre-Admission Assessment   Patient: Louis Juarez is an 82 y.o., male MRN: [id number] DOB: 1941-04-08 Height: 6\' 2"  (188 cm) Weight: 83.1 kg   Insurance Information HMO:     PPO: yes     PCP:      IPA:      80/20:      OTHER:  PRIMARYChauncey Juarez       Policy#: [id number]      Subscriber: Louis Juarez CM Name:  via portal  approval    Phone#: [phone]     Fax#: [phone] [employer] or fax [phone] Pre-Cert#:   [id number]  approved 10/22 until 10/28  Employer: [employer] Benefits:  Phone #: [phone]     Name: 10/10 Eff. Date: 08/13/22     Deduct: $3200 ( met)       Out of Pocket Max: $4600 ( Met)      Life Max: none CIR: 90%      SNF: 90% Outpatient: 90%     Co-Pay: 10% Home Health: 90%      Co-Pay: 10% DME: 90%     Co-Pay: 10% Providers: in-network SECONDARY: Medicare A  only    Policy#: [id number]     Phone#: verified eligibility via [employer] on 04/27/23 active 04/20/2005   Financial Counselor:       Phone#:    The "Data Collection Information Summary" for patients in Inpatient Rehabilitation Facilities with attached "Privacy Act Statement-Health Care Records" was provided and verbally reviewed with: Patient and Family   Emergency Contact Information Contact Information       Name Relation Home Work Mobile    Louis Juarez,Louis Juarez Spouse     [phone]         Other Contacts   None on File      Current Medical  History  Patient Admitting Diagnosis: multiple fractures s/p fall   History of Present Illness:  82 year old male with medical hx significant for: HF with ICD . Pt [facility] to [facility] 04/24/23 after a fall off a ladder as he was cutting limbs.. Pt fell ~20 feet.   Pt c/o pain in right elbow and right hip also had large laceration to right elbow area. Pelvic x-ray showed open book pelvic fracture. Pelvic binder placed. Imaging also revealed an open right elbow/olecranon fx, right 6-12 th rib fxs, right T10 TP fxs, age-indeterminate mild compression fxs of T 4, T7, T12. Incidental finding of pulmonary nodules and severe atherosclerotic calcifications of left carotid bulb and proximal left cervical ICA. Pt initially placed on 2 L O2 but increased to 100% nonrebreather. Orthopedics consulted. Pt underwent percutaneous fixation of pelvis and I&D of right olecranon fx on 04/24/23 by Dr. Jena Gauss. Hbg 7 on 10/6; received 1 unit PRBC.    10/18 to OR for loss of fixation to pelvis. Underwent ORIF pubic symphysis and right superior pubic ramus. ORIF right posterior pelvis/SI joint. Also I and D of right elbow infections.    Patient's  medical record from [facility] has been reviewed by the rehabilitation admission coordinator and physician.   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 Yes   Family History   family history is not on file.   Current Medications  Current Medications    Current Facility-Administered Medications:    0.9 %  sodium chloride infusion (Manually program via Guardrails IV Fluids), , Intravenous, Once, Haddix, Gillie Manners, MD   acetaminophen (TYLENOL) tablet 1,000 mg, 1,000 mg, Oral, Q6H, Haddix, Gillie Manners, MD, 1,000 mg at 05/13/23 0650   albuterol (PROVENTIL) (2.5 MG/3ML) 0.083% nebulizer solution 3 mL, 3 mL, Inhalation, Q6H PRN, Haddix, Gillie Manners, MD, 3 mL at 05/03/23 1231   carvedilol (COREG) tablet 25 mg, 25 mg, Oral, BID WC, Lysle Rubens, MD, 25 mg at 05/13/23 0747   Chlorhexidine Gluconate Cloth 2 % PADS 6 each, 6 each, Topical, Daily, Haddix, Gillie Manners, MD, 6 each at 05/13/23 [phone]   cholecalciferol (VITAMIN D3) 25 MCG (1000 UNIT) tablet 2,000 Units, 2,000 Units, Oral, Daily, Haddix, Gillie Manners, MD, 2,000 Units at 05/13/23 0951   enoxaparin (LOVENOX) injection 30 mg, 30 mg, Subcutaneous, Q12H, Haddix, Gillie Manners, MD, 30 mg at 05/13/23 0951   ezetimibe (ZETIA) tablet 10 mg, 10 mg, Oral, Daily, Haddix, Gillie Manners, MD, 10 mg at 05/13/23 1191   feeding supplement (ENSURE ENLIVE / ENSURE PLUS) liquid 237 mL, 237 mL, Oral, TID BM, Haddix, Gillie Manners, MD, 237 mL at 05/13/23 0951   furosemide (LASIX) tablet 20 mg, 20 mg, Oral, BID, Lysle Rubens, MD, 20 mg at 05/13/23 0747   guaiFENesin-dextromethorphan (ROBITUSSIN DM) 100-10 MG/5ML syrup 10 mL, 10 mL, Oral, Q6H PRN, Lysle Rubens, MD, 10 mL at 05/11/23 4782   hydrALAZINE (APRESOLINE) injection 10 mg, 10 mg, Intravenous, Q2H PRN, Haddix, Gillie Manners, MD   loratadine (CLARITIN) tablet 10 mg, 10 mg, Oral, QPM, Haddix, Gillie Manners, MD, 10 mg at 05/12/23 1820   LORazepam (ATIVAN) tablet 0.5 mg, 0.5 mg, Oral, Q2200, Haddix, Gillie Manners, MD, 0.5 mg at 05/12/23 2328   melatonin tablet 3 mg, 3 mg, Oral, QHS, Haddix, Gillie Manners, MD, 3 mg at 05/12/23 2328   methocarbamol (ROBAXIN) tablet 1,000 mg, 1,000 mg, Oral, Q8H,  Haddix, Gillie Manners, MD, 1,000 mg at 05/13/23 0649   metoprolol tartrate (LOPRESSOR) injection 5 mg, 5 mg, Intravenous, Q6H PRN, Haddix, Gillie Manners, MD   multivitamin with minerals tablet 1 tablet, 1 tablet, Oral, Daily, Haddix, Gillie Manners, MD, 1 tablet at 05/13/23 0951   ondansetron (ZOFRAN-ODT) disintegrating tablet 4 mg, 4 mg, Oral, Q6H PRN **OR** ondansetron (ZOFRAN) injection 4 mg, 4  mg, Intravenous, Q6H PRN, Haddix, Gillie Manners, MD   Oral care mouth rinse, 15 mL, Mouth Rinse, PRN, Haddix, Gillie Manners, MD   oxyCODONE (Oxy IR/ROXICODONE) immediate release tablet 5 mg, 5 mg, Oral, Q4H PRN, Haddix, Gillie Manners, MD, 5 mg at 05/12/23 1825   pantoprazole (PROTONIX) EC tablet 20 mg, 20 mg, Oral, Daily, Haddix, Gillie Manners, MD, 20 mg at 05/13/23 0951   polyethylene glycol (MIRALAX / GLYCOLAX) packet 17 g, 17 g, Oral, Daily PRN, Lysle Rubens, MD   potassium chloride SA (KLOR-CON M) CR tablet 40 mEq, 40 mEq, Oral, BID, Lysle Rubens, MD, 40 mEq at 05/13/23 0951   pravastatin (PRAVACHOL) tablet 80 mg, 80 mg, Oral, Daily, Haddix, Gillie Manners, MD, 80 mg at 05/13/23 0951   QUEtiapine (SEROQUEL) tablet 50 mg, 50 mg, Oral, QHS PRN, Haddix, Gillie Manners, MD, 50 mg at 05/11/23 2006   spironolactone (ALDACTONE) tablet 25 mg, 25 mg, Oral, Daily, Lysle Rubens, MD, 25 mg at 05/13/23 0951   tamsulosin (FLOMAX) capsule 0.8 mg, 0.8 mg, Oral, Daily, Haddix, Gillie Manners, MD, 0.8 mg at 05/13/23 1610     Patients Current Diet:  Diet Order                  Diet regular Room service appropriate? Yes; Fluid consistency: Thin  Diet effective now                       Precautions / Restrictions Precautions Precautions: Fall Restrictions Weight Bearing Restrictions: Yes RUE Weight Bearing: Weight bearing as tolerated RLE Weight Bearing: Non weight bearing LLE Weight Bearing: Weight bearing as tolerated (For transfer only) LLE Partial Weight Bearing Percentage or Pounds: . Other Position/Activity Restrictions: LLE WBAT for transfers only     Has the patient had 2 or more falls or a fall with injury in the past year? Yes   Prior Activity Level Limited Community (1-2x/wk): drives, gets ouf house ~1 day/week/mainly doctor's appointements   Prior Functional Level Self Care: Did the patient need help bathing, dressing, using the toilet or eating? Independent   Indoor Mobility: Did the patient need assistance with walking from room to room (with or without device)? Independent   Stairs: Did the patient need assistance with internal or external stairs (with or without device)? Independent   Functional Cognition: Did the patient need help planning regular tasks such as shopping or remembering to take medications? Independent   Patient Information Are you of Hispanic, Latino/a,or Spanish origin?: A. No, not of Hispanic, Latino/a, or Spanish origin What is your race?: A. White Do you need or want an interpreter to communicate with a doctor or health care staff?: 0. No   Patient's Response To:  Health Literacy and Transportation Is the patient able to respond to health literacy and transportation needs?: Yes Health Literacy - How often do you need to have someone help you when you read instructions, pamphlets, or other written material from your doctor or pharmacy?: Never In the past 12 months, has lack of transportation kept you from medical appointments or from getting medications?: No In the past 12 months, has lack of transportation kept you from meetings, work, or from getting things needed for daily living?: No   Home Assistive Devices / Equipment Home Equipment: None   Prior Device Use: Indicate devices/aids used by the patient prior to current illness, exacerbation or injury? None of the above   Current Functional Level Cognition   Overall Cognitive Status: Impaired/Different from  baseline Orientation Level: Oriented to person, Oriented to place Following Commands: Follows one step commands inconsistently, Follows  one step commands with increased time Safety/Judgement: Decreased awareness of deficits General Comments: Repetitive questioning, cannot recall WB status for RLE. Wife present for session    Extremity Assessment (includes Sensation/Coordination)   Upper Extremity Assessment: Defer to OT evaluation RUE Deficits / Details: R olecranon fx with ORIF I&D completed on 10/18. pt lacks full extension minimally. Full ROM of wrist and digits . pt can do pad to pad, no change in sensation RUE: Unable to fully assess due to immobilization RUE Sensation: WNL RUE Coordination: WNL  Lower Extremity Assessment: Defer to PT evaluation RLE Deficits / Details: unable to lift RLE against gravity RLE Sensation: decreased proprioception RLE Coordination: decreased gross motor     ADLs   Overall ADL's : Needs assistance/impaired Eating/Feeding: Set up, Sitting Grooming: Wash/dry hands, Wash/dry face, Set up, Sitting Grooming Details (indicate cue type and reason): shaving face with electric razor then blade razor . Upper Body Bathing: Minimal assistance, Sitting Lower Body Bathing: Maximal assistance, Total assistance, Sit to/from stand, Sitting/lateral leans Upper Body Dressing : Minimal assistance, Sitting Lower Body Dressing: Total assistance, Maximal assistance, Sit to/from stand, Sitting/lateral leans Lower Body Dressing Details (indicate cue type and reason): to donn socks Toilet Transfer: Moderate assistance, +2 for safety/equipment, +2 for physical assistance Toilet Transfer Details (indicate cue type and reason): up in chair upon arrival Toileting- Clothing Manipulation and Hygiene: Total assistance, Sitting/lateral lean, Sit to/from stand Toileting - Clothing Manipulation Details (indicate cue type and reason): unable to complete peri-care in standing Functional mobility during ADLs: Moderate assistance, Cueing for sequencing, Cueing for safety, +2 for physical assistance, +2 for  safety/equipment General ADL Comments: Patient wit need for re-eval after surgery on 10/18 for ORIF of pelvis and I&D of elbow. Continuesd difficulty recalling precautions. OT speaking with wife and patient to promote use of slide board or scooting back to bed as patient isnt fully cognizant of the weight he is putting through his leg with the Stedy. OT continues to recommend intensive rehab > 3hours. OT will follow.     Mobility   Overal bed mobility: Needs Assistance Bed Mobility: Rolling, Supine to Sit, Sit to Supine Rolling: Max assist, Used rails Sidelying to sit: Max assist, HOB elevated, Used rails Supine to sit: Max assist Sit to supine: Mod assist General bed mobility comments: Assist to bring hips and shoulders over to roll. Assist to bring legs off of bed, elevate trunk into sitting, and bring hips to EOB. Assist to bring legs back up into bed.     Transfers   Overall transfer level: Needs assistance Equipment used: Ambulation equipment used Transfers: Bed to chair/wheelchair/BSC, Sit to/from Stand Sit to Stand: Max assist Bed to/from chair/wheelchair/BSC transfer type:: Via Lift equipment  Lateral/Scoot Transfers: Mod assist, +2 physical assistance, +2 safety/equipment, With slide board Transfer via Lift Equipment: Stedy General transfer comment: Scooted laterally on EOB with mod assist     Ambulation / Gait / Stairs / Psychologist, prison and probation services   Ambulation/Gait General Gait Details: transfers only     Posture / Balance Dynamic Sitting Balance Sitting balance - Comments: L lean due to pain in rt pelvis, min A to maintain sitting Balance Overall balance assessment: Needs assistance Sitting-balance support: Feet supported, Bilateral upper extremity supported Sitting balance-Leahy Scale: Poor Sitting balance - Comments: L lean due to pain in rt pelvis, min A to maintain sitting Postural control: Left lateral lean Standing  balance support: Bilateral upper extremity supported,  Reliant on assistive device for balance Standing balance-Leahy Scale: Zero Standing balance comment: max A needed to maintain upright and keep wt off RLE     Special needs/care consideration      Previous Home Environment  Living Arrangements: Spouse/significant other  Lives With: Spouse Available Help at Discharge: Family, Available 24 hours/day Type of Home: House Home Layout: Able to live on main level with bedroom/bathroom Alternate Level Stairs-Number of Steps: does not go upstairs` Home Access: Stairs to enter Entrance Stairs-Rails: Can reach both Entrance Stairs-Number of Steps: 8 Bathroom Shower/Tub: Health visitor: Standard (also has toilet seat) Bathroom Accessibility: Yes How Accessible: Accessible via walker Home Care Services: No   Discharge Living Setting Plans for Discharge Living Setting: Patient's home Type of Home at Discharge: House Discharge Home Layout: Able to live on main level with bedroom/bathroom Discharge Home Access: Stairs to enter Entrance Stairs-Rails: Can reach both Entrance Stairs-Number of Steps: 8 Discharge Bathroom Shower/Tub: Walk-in shower Discharge Bathroom Toilet: Standard (also has toilet seat) Discharge Bathroom Accessibility: Yes How Accessible: Accessible via walker Does the patient have any problems obtaining your medications?: No   Social/Family/Support Systems Patient Roles: Spouse, Parent Contact Information: wife Anticipated Caregiver: wife and son from [place] Anticipated Caregiver's Contact Information: [phone] Ability/Limitations of Caregiver: wife works , but can do from home Caregiver Availability: 24/7 Discharge Plan Discussed with Primary Caregiver: Yes Is Caregiver In Agreement with Plan?: Yes Does Caregiver/Family have Issues with Lodging/Transportation while Pt is in Rehab?: No   Wife to take some FMLA and son from [place] to also come to assist.   Goals Patient/Family Goal for  Rehab: supervision to min with PT and OT, supervision with SLP Expected length of stay: ELOS 14 to 20 days Pt/Family Agrees to Admission and willing to participate: Yes Program Orientation Provided & Reviewed with Pt/Caregiver Including Roles  & Responsibilities: Yes   Decrease burden of Care through IP rehab admission: NA   Possible need for SNF placement upon discharge: Not anticipated   Patient Condition: I have reviewed medical records from [facility], spoken with CM, and patient and spouse. I met with patient at the bedside for inpatient rehabilitation assessment.  Patient will benefit from ongoing PT, OT, and SLP, can actively participate in 3 hours of therapy a day 5 days of the week, and can make measurable gains during the admission.  Patient will also benefit from the coordinated team approach during an Inpatient Acute Rehabilitation admission.  The patient will receive intensive therapy as well as Rehabilitation physician, nursing, social worker, and care management interventions.  Due to bladder management, safety, skin/wound care, disease management, medication administration, pain management, and patient education the patient requires 24 hour a day rehabilitation nursing.  The patient is Max assist overall post discharge at home with home health is anticipated.  Patient has agreed to participate in the Acute Inpatient Rehabilitation Program and will admit today.   Preadmission Screen Completed By:  Ottie Glazier RN MSN 05/13/2023 10:14 AM ______________________________________________________________________   Discussed status with Dr. Carlis Abbott on 05/13/23 at 1009 and received approval for admission today.   Admission Coordinator: Ottie Glazier RN MSN, time 1009 Date 05/13/23    Assessment/Plan: Diagnosis: Pelvic fracture Does the need for close, 24 hr/day Medical supervision in concert with the patient's rehab needs make it unreasonable for this patient to be served in a  less intensive setting? Yes Co-Morbidities requiring supervision/potential complications: HF, fall off ladder, right  elbow fracture, right 6-12th rib fractures, right T10 TP fractures Due to bladder management, bowel management, safety, skin/wound care, disease management, medication administration, pain management, and patient education, does the patient require 24 hr/day rehab nursing? Yes Does the patient require coordinated care of a physician, rehab nurse, PT, OT to address physical and functional deficits in the context of the above medical diagnosis(es)?  Addressing deficits in the following areas:  Can the patient actively participate in an intensive therapy program of at least 3 hrs of therapy 5 days a week?  The potential for patient to make measurable gains while on inpatient rehab is  Anticipated functional outcomes upon discharge from inpatient rehab: supervision PT, supervision OT, supervision SLP Estimated rehab length of stay to reach the above functional goals is: 14-20 days Anticipated discharge destination: Home 10. Overall Rehab/Functional Prognosis: excellent     MD Signature: Sula Soda, MD          Revision History

## 2023-05-13 NOTE — Progress Notes (Signed)
Progress Note  5 Days Post-Op  Subjective: Had "sour" stomach after not eating for a while. This has resolved and ate breakfast this am. Denies SHOB on O2   Objective: Vital signs in last 24 hours: Temp:  [97.7 F (36.5 C)-98.4 F (36.9 C)] 98.4 F (36.9 C) (10/22 2337) Pulse Rate:  [70-76] 70 (10/22 2337) Resp:  [17-18] 17 (10/22 2337) BP: (134-150)/(71-81) 150/71 (10/22 2337) SpO2:  [95 %-96 %] 96 % (10/22 2337) Weight:  [83.1 kg] 83.1 kg (10/23 0500) Last BM Date : 05/12/23  Intake/Output from previous day: 10/22 0701 - 10/23 0700 In: -  Out: 2350 [Urine:2350] Intake/Output this shift: No intake/output data recorded.  PE: General: pleasant, WD, male who is laying in bed in NAD Heart: regular, rate, and rhythm.  Normal s1,s2. No obvious murmurs, gallops, or rubs noted.  Lungs:  slight rhonchi b/l upper lobes. Respiratory effort nonlabored on supplemental O2 Abd: soft, NT, ND, no masses, hernias, or organomegaly MSK: all 4 extremities are symmetrical with no cyanosis, clubbing, or edema. Skin: warm and dry  Psych: A&Ox3 with an appropriate affect.    Lab Results:  Recent Labs    05/11/23 0722  WBC 17.3*  HGB 8.5*  HCT 27.5*  PLT 600*   BMET Recent Labs    05/12/23 0756 05/13/23 0546  NA 137 135  K 4.6 4.4  CL 100 101  CO2 23 25  GLUCOSE 150* 140*  BUN 16 15  CREATININE 0.78 0.63  CALCIUM 9.6 9.1   PT/INR No results for input(s): "LABPROT", "INR" in the last 72 hours. CMP     Component Value Date/Time   NA 135 05/13/2023 0546   K 4.4 05/13/2023 0546   CL 101 05/13/2023 0546   CO2 25 05/13/2023 0546   GLUCOSE 140 (H) 05/13/2023 0546   BUN 15 05/13/2023 0546   CREATININE 0.63 05/13/2023 0546   CALCIUM 9.1 05/13/2023 0546   PROT 6.5 04/24/2023 1143   ALBUMIN 2.2 (L) 05/13/2023 0546   AST 75 (H) 04/24/2023 1143   ALT 58 (H) 04/24/2023 1143   ALKPHOS 91 04/24/2023 1143   BILITOT 0.8 04/24/2023 1143   GFRNONAA >60 05/13/2023 0546   Lipase   No results found for: "LIPASE"     Studies/Results: No results found.  Anti-infectives: Anti-infectives (From admission, onward)    Start     Dose/Rate Route Frequency Ordered Stop   05/08/23 1029  vancomycin (VANCOCIN) powder  Status:  Discontinued          As needed 05/08/23 1030 05/08/23 1157   05/03/23 1100  piperacillin-tazobactam (ZOSYN) IVPB 3.375 g        3.375 g 12.5 mL/hr over 240 Minutes Intravenous Every 8 hours 05/03/23 1010 05/12/23 0329   04/24/23 2300  ceFAZolin (ANCEF) IVPB 2g/100 mL premix        2 g 200 mL/hr over 30 Minutes Intravenous Every 8 hours 04/24/23 1900 04/25/23 1557   04/24/23 1714  vancomycin (VANCOCIN) powder  Status:  Discontinued          As needed 04/24/23 1715 04/24/23 1749   04/24/23 1300  metroNIDAZOLE (FLAGYL) IVPB 500 mg        500 mg 100 mL/hr over 60 Minutes Intravenous  Once 04/24/23 1257 04/24/23 1459   04/24/23 1215  ceFAZolin (ANCEF) IVPB 2g/100 mL premix        2 g 200 mL/hr over 30 Minutes Intravenous  Once 04/24/23 1202 04/24/23 1258  Assessment/Plan Fall from ladder, 20 ft   Hemmorrhagic shock - resolved Pelvic fractures (R iliac bone, R pubic rami, and right and left sacrum with 10cm diastasis of pubic symphysis) -  Foley and binder placed in trauma bay. OR ortho 10/4 perc fixation of numerous pelvic fxs, ORIF right elbow. ORIF of pubic symphysis and R sup pubic ramus, R posterior pelvis/SI joint with Dr. Jena Gauss on 10/18 - NWB RLE and WB LLE for transfers only Open R elbow/olecranon fx - Per Ortho --> I&D 10/4, infection noted 10/14 and Dr Jena Gauss removed a couple of sutures. Washout in OR 10/18 Dr. Jena Gauss.              - IV Zosyn completed R 6-12th rib fxs - multimodal pain control, pulm toilet, increased secretions, starting guaifenesin-dextromethorphan to help thin secretions Right pleural effusion - requiring 2L , not on home oxygen - Continue home lasix 20 BID and spironolactone 25qd R T10 TP fxs -  multimodal pain control.  Age-indeterminate mild compression fractures of T4, T7 and T12 - discussed with NSGY Dr. Jordan Likes who reviewed images. Does not need OR or brace/spine precautions. Incidental findings - Pulmonary nodules and Severe atherosclerotic calcifications of the left carotid bulb and proximal left cervical ICA. 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 +BM  ID: Tdap in ED, Ancef in ED for open fracture; Zosyn started 10/13 due to colitis noted on CT and possible RLL pneumonia. Purulent D/C from R elbow - washout 10/18 by Dr. Jena Gauss, completed 7d course of zosyn               VTE: SCDs, LMWH Foley - out, voiding Dispo: wean O2, PT/OT, dc to CIR  I reviewed last 24 h vitals and pain scores, last 48 h intake and output, last 24 h labs and trends, and last 24 h imaging results.     LOS: 19 days   Eric Form, [facility] Surgery 05/13/2023, 9:26 AM Please see Amion for pager number during day hours 7:00am-4:30pm

## 2023-05-13 NOTE — Discharge Summary (Signed)
Physician Discharge Summary  Patient ID: Louis Juarez MRN: [id number] DOB/AGE: 01/03/1941 83 y.o.  Admit date: 04/24/2023 Discharge date: 05/13/2023  Admission Diagnoses Pelvic fracture (HCC) [S32.9XXA] Fall, initial encounter [id number]] Closed displaced fracture of pelvis, unspecified part of pelvis, initial encounter (HCC) [S32.9XXA] Hypotension due to hypovolemia [E86.1]  Discharge Diagnoses Pelvic fracture (HCC) [S32.9XXA] Fall, initial encounter [id number]] Closed displaced fracture of pelvis, unspecified part of pelvis, initial encounter (HCC) [S32.9XXA] Hypotension due to hypovolemia [E86.1]   Consultants [facility]opedic Juarez - Dr. Hulda Humphrey, Dr. Jena Gauss  Procedures  Dr.Haddix 10/4 CPT 27216-Percutaneous fixation of right posterior pelvic fracture CPT 27216-Percutaneous fixation of left sacrum CPT 27217-Percutaneous fixation of right superior pubic ramus fracture CPT 24685-Open treatment of right olecranon avulsion fracture CPT 11012-Irrigation and debridement of right open olecranon fracture CPT 24000-Irrigation and debridement of right open elbow arthrotomy  Dr. Jena Gauss 10/18 CPT 27217-Open reduction internal fixation of pubic symphysis and right superior pubic ramus CPT 27218-Open reduction internal fixation of right posterior pelvis/SI joint CPT 10180-Irrigation and debridement of right elbow infection    HPI:  82 y.o. male who presents to [facility] ED as Level 1 trauma via EMS after fall from ladder. He was cutting limbs when one fell and knocked him off the ladder. Fell approximately 20 feet. Denies LOC. Larey Seat on his right side and complained of right elbow and right hip pain. Per EMS he was hypotensive in the field and enroute and no meds given. Pelvic xray in ED showed open book pelvic fracture. He remained hypotensive in ED which improved after transfusion and pelvic binder placement. He denied anticoagulant use.    Past medical history otherwise significant for HF with ICD  in place. He denies allergies   He lives at home with his wife.  Hospital Course:   Patient was admitted to the trauma service for further evaluation and treatment as below:  Fall from ladder, 20 ft   Hemmorrhagic shock - resolved with red blood cell transfusion Pelvic fractures (R iliac bone, R pubic rami, and right and left sacrum with 10cm diastasis of pubic symphysis) -  Foley and binder placed in trauma bay. Went to OR with ortho 10/4 perc fixation of numerous pelvic fracture and ORIF right elbow. Underwent ORIF of pubic symphysis and Right superior pubic ramus, R posterior pelvis/SI joint with Dr. Jena Gauss on 10/18 At time of discharge he was NWB RLE and WB LLE for transfers only. Open Right elbow/olecranon fracture - Per Ortho underwent I&D 10/4, infection noted 10/14 and Dr Jena Gauss removed a couple of sutures. Washout in OR 10/18 Dr. Jena Gauss.              - IV Zosyn completed during admission Right 6-12th rib fractures - multimodal pain control, pulm toilet. He had increased secretions, started guaifenesin-dextromethorphan to help thin secretions Right pleural effusion - Continue home lasix 20 BID and spironolactone 25qd Right T10 TP fxs - multimodal pain control.  Age-indeterminate mild compression fractures of T4, T7 and T12 - discussed with NSGY Dr. Jordan Likes who reviewed images. Does not need OR or brace/spine precautions. Incidental findings - Pulmonary nodules and Severe atherosclerotic calcifications of the left carotid bulb and proximal left cervical ICA. Will need pcp f/u for further w/u as outpatient  HTN - Increased coreg to home dose 25 BID  On date of discharge patient had appropriately progressed with therapies and met criteria for safe discharge to CIR with the support of wife.  I discussed discharge instructions with patient as well as return  precautions and all questions and concerns were addressed.        Follow-up Information     Haddix, Gillie Manners, MD. Schedule an  appointment as soon as possible for a visit in 2 week(s).   Specialty: Orthopedic Juarez Why: for wound check, suture removal right elbow, and wound check Contact information: [address] [place] [address] [phone]         Corrington, Meredith Mody, MD. Call.   Specialty: Family Medicine Why: Recommend post-hospitalizatoin follow up with your primary care physician. follow up regarding rib fractures as well as incidental findings on CT: Pulmonary nodules and Severe atherosclerotic calcifications of the left carotid bulb and proximal left cervical ICA Contact information: [address] [place] [address] [phone]                 Signed: Clarise Juarez [facility] Juarez 05/13/2023, 10:00 AM Please see Amion for pager number during day hours 7:00am-4:30pm

## 2023-05-13 NOTE — Plan of Care (Signed)
  Problem: Education: Goal: Knowledge of General Education information will improve Description: Including pain rating scale, medication(s)/side effects and non-pharmacologic comfort measures 05/13/2023 1253 by Collene Gobble, RN Outcome: Adequate for Discharge 05/13/2023 1108 by Collene Gobble, RN Outcome: Adequate for Discharge   Problem: Health Behavior/Discharge Planning: Goal: Ability to manage health-related needs will improve 05/13/2023 1253 by Collene Gobble, RN Outcome: Adequate for Discharge 05/13/2023 1108 by Collene Gobble, RN Outcome: Adequate for Discharge   Problem: Clinical Measurements: Goal: Ability to maintain clinical measurements within normal limits will improve 05/13/2023 1253 by Collene Gobble, RN Outcome: Adequate for Discharge 05/13/2023 1108 by Collene Gobble, RN Outcome: Adequate for Discharge Goal: Will remain free from infection 05/13/2023 1253 by Collene Gobble, RN Outcome: Adequate for Discharge 05/13/2023 1108 by Collene Gobble, RN Outcome: Adequate for Discharge Goal: Diagnostic test results will improve 05/13/2023 1253 by Collene Gobble, RN Outcome: Adequate for Discharge 05/13/2023 1108 by Collene Gobble, RN Outcome: Adequate for Discharge Goal: Respiratory complications will improve 05/13/2023 1253 by Collene Gobble, RN Outcome: Adequate for Discharge 05/13/2023 1108 by Collene Gobble, RN Outcome: Adequate for Discharge Goal: Cardiovascular complication will be avoided 05/13/2023 1253 by Collene Gobble, RN Outcome: Adequate for Discharge 05/13/2023 1108 by Collene Gobble, RN Outcome: Adequate for Discharge   Problem: Activity: Goal: Risk for activity intolerance will decrease 05/13/2023 1253 by Collene Gobble, RN Outcome: Adequate for Discharge 05/13/2023 1108 by Collene Gobble, RN Outcome: Adequate for Discharge   Problem: Nutrition: Goal: Adequate nutrition will be maintained 05/13/2023 1253 by Collene Gobble,  RN Outcome: Adequate for Discharge 05/13/2023 1108 by Collene Gobble, RN Outcome: Adequate for Discharge   Problem: Coping: Goal: Level of anxiety will decrease 05/13/2023 1253 by Collene Gobble, RN Outcome: Adequate for Discharge 05/13/2023 1108 by Collene Gobble, RN Outcome: Adequate for Discharge   Problem: Elimination: Goal: Will not experience complications related to bowel motility 05/13/2023 1253 by Collene Gobble, RN Outcome: Adequate for Discharge 05/13/2023 1108 by Collene Gobble, RN Outcome: Adequate for Discharge Goal: Will not experience complications related to urinary retention 05/13/2023 1253 by Collene Gobble, RN Outcome: Adequate for Discharge 05/13/2023 1108 by Collene Gobble, RN Outcome: Adequate for Discharge   Problem: Pain Managment: Goal: General experience of comfort will improve 05/13/2023 1253 by Collene Gobble, RN Outcome: Adequate for Discharge 05/13/2023 1108 by Collene Gobble, RN Outcome: Adequate for Discharge   Problem: Safety: Goal: Ability to remain free from injury will improve 05/13/2023 1253 by Collene Gobble, RN Outcome: Adequate for Discharge 05/13/2023 1108 by Collene Gobble, RN Outcome: Adequate for Discharge   Problem: Skin Integrity: Goal: Risk for impaired skin integrity will decrease 05/13/2023 1253 by Collene Gobble, RN Outcome: Adequate for Discharge 05/13/2023 1108 by Collene Gobble, RN Outcome: Adequate for Discharge

## 2023-05-13 NOTE — Progress Notes (Signed)
Inpatient Rehabilitation Admission Medication Review by a Pharmacist  A complete drug regimen review was completed for this patient to identify any potential clinically significant medication issues.  High Risk Drug Classes Is patient taking? Indication by Medication  Antipsychotic Yes, as an intravenous medication Quetiapine - PRN sleep Prochlorperazine - PRN nausea  Anticoagulant Yes Enoxaparin - VTE ppx  Antibiotic No   Opioid Yes Oxycodone - PRN pain  Antiplatelet No   Hypoglycemics/insulin No   Vasoactive Medication Yes, as an intravenous medication Carvedilol, PRN metoprolol - HTN Furosemide, spironolactone - CHF  Chemotherapy No   Other Yes Vit D, Kcl, MVI - supplement Albuterol - PRN SOB Ezetimibe, pravastatin - HLD Tamsulosin - urinary retention Lorazepam, melatonin - sleep Methocarbamol - muscle spasms Loratadine - allergic rhinitis Pantoprazole - GERD ppx     Type of Medication Issue Identified Description of Issue Recommendation(s)  Drug Interaction(s) (clinically significant)     Duplicate Therapy     Allergy     No Medication Administration End Date     Incorrect Dose     Additional Drug Therapy Needed     Significant med changes from prior encounter (inform family/care partners about these prior to discharge). Amitriptyline, colesevelam, losartan, mirabegron, zolpidem stopped at CIR admission > resume as able in CIR Communicate medication changes with patient/family at discharge  Other       Clinically significant medication issues were identified that warrant physician communication and completion of prescribed/recommended actions by midnight of the next day:  No   Pharmacist comments: n/a   Time spent performing this drug regimen review (minutes): 20   Thank you for allowing pharmacy to be a part of this patient's care.  Thelma Barge, PharmD Clinical Pharmacist

## 2023-05-14 ENCOUNTER — Inpatient Hospital Stay (HOSPITAL_COMMUNITY): Payer: BC Managed Care – PPO

## 2023-05-14 DIAGNOSIS — K5900 Constipation, unspecified: Secondary | ICD-10-CM | POA: Diagnosis not present

## 2023-05-14 DIAGNOSIS — S51001D Unspecified open wound of right elbow, subsequent encounter: Secondary | ICD-10-CM

## 2023-05-14 DIAGNOSIS — T1490XA Injury, unspecified, initial encounter: Secondary | ICD-10-CM | POA: Diagnosis not present

## 2023-05-14 DIAGNOSIS — D72829 Elevated white blood cell count, unspecified: Secondary | ICD-10-CM | POA: Diagnosis not present

## 2023-05-14 LAB — CBC WITH DIFFERENTIAL/PLATELET
Abs Immature Granulocytes: 0.14 10*3/uL — ABNORMAL HIGH (ref 0.00–0.07)
Basophils Absolute: 0.1 10*3/uL (ref 0.0–0.1)
Basophils Relative: 1 %
Eosinophils Absolute: 0.7 10*3/uL — ABNORMAL HIGH (ref 0.0–0.5)
Eosinophils Relative: 5 %
HCT: 28.9 % — ABNORMAL LOW (ref 39.0–52.0)
Hemoglobin: 8.9 g/dL — ABNORMAL LOW (ref 13.0–17.0)
Immature Granulocytes: 1 %
Lymphocytes Relative: 16 %
Lymphs Abs: 2.1 10*3/uL (ref 0.7–4.0)
MCH: 31 pg (ref 26.0–34.0)
MCHC: 30.8 g/dL (ref 30.0–36.0)
MCV: 100.7 fL — ABNORMAL HIGH (ref 80.0–100.0)
Monocytes Absolute: 1.3 10*3/uL — ABNORMAL HIGH (ref 0.1–1.0)
Monocytes Relative: 10 %
Neutro Abs: 8.7 10*3/uL — ABNORMAL HIGH (ref 1.7–7.7)
Neutrophils Relative %: 67 %
Platelets: 579 10*3/uL — ABNORMAL HIGH (ref 150–400)
RBC: 2.87 MIL/uL — ABNORMAL LOW (ref 4.22–5.81)
RDW: 17.6 % — ABNORMAL HIGH (ref 11.5–15.5)
WBC: 12.9 10*3/uL — ABNORMAL HIGH (ref 4.0–10.5)
nRBC: 0 % (ref 0.0–0.2)

## 2023-05-14 LAB — COMPREHENSIVE METABOLIC PANEL
ALT: 23 U/L (ref 0–44)
AST: 23 U/L (ref 15–41)
Albumin: 2.3 g/dL — ABNORMAL LOW (ref 3.5–5.0)
Alkaline Phosphatase: 180 U/L — ABNORMAL HIGH (ref 38–126)
Anion gap: 10 (ref 5–15)
BUN: 16 mg/dL (ref 8–23)
CO2: 26 mmol/L (ref 22–32)
Calcium: 9.3 mg/dL (ref 8.9–10.3)
Chloride: 99 mmol/L (ref 98–111)
Creatinine, Ser: 0.78 mg/dL (ref 0.61–1.24)
GFR, Estimated: >60 mL/min (ref >60)
Glucose, Bld: 139 mg/dL — ABNORMAL HIGH (ref 70–99)
Potassium: 4.2 mmol/L (ref 3.5–5.1)
Sodium: 135 mmol/L (ref 135–145)
Total Bilirubin: 0.6 mg/dL (ref 0.3–1.2)
Total Protein: 7.2 g/dL (ref 6.5–8.1)

## 2023-05-14 LAB — MAGNESIUM: Magnesium: 1.9 mg/dL (ref 1.7–2.4)

## 2023-05-14 NOTE — Progress Notes (Signed)
Inpatient Rehabilitation  Patient information reviewed and entered into eRehab system by Demarrio Menges Leighanna Kirn, OTR/L, Rehab Quality Coordinator.   Information including medical coding, functional ability and quality indicators will be reviewed and updated through discharge.   

## 2023-05-14 NOTE — Progress Notes (Signed)
Patient ID: Alezander Lycett., male   DOB: [birth year], 82 y.o.   MRN: [id number] Met with the patient to review current situation, rehab schedule, team conference and plan of care. Reviewed medication and dietary modification recommendations. Reviewed bowel and bladder management with urinary retention and constipation. Patient noted pain is limiting despite medication for pain and muscle relaxers. Discussed complications post rib fractures and audible wheezes, wet voice and use of the flutter valve. Patient confirmed 8 ste to home and weight bearing restrictions. Reported insomnia (on melatonin) and sleep interrupted by pain. K-pad requested. Continue to follow along to address educational needs to facilitate preparation for discharge. Pamelia Hoit

## 2023-05-14 NOTE — Progress Notes (Signed)
Inpatient Rehabilitation Center Individual Statement of Services  Patient Name:  Louis Juarez.  Date:  05/14/2023  Welcome to the Inpatient Rehabilitation Center.  Our goal is to provide you with an individualized program based on your diagnosis and situation, designed to meet your specific needs.  With this comprehensive rehabilitation program, you will be expected to participate in at least 3 hours of rehabilitation therapies Monday-Friday, with modified therapy programming on the weekends.  Your rehabilitation program will include the following services:  Physical Therapy (PT), Occupational Therapy (OT), 24 hour per day rehabilitation nursing, Therapeutic Recreaction (TR), Neuropsychology, Care Coordinator, Rehabilitation Medicine, Nutrition Services, and Pharmacy Services  Weekly team conferences will be held on Wednesday to discuss your progress.  Your Inpatient Rehabilitation Care Coordinator will talk with you frequently to get your input and to update you on team discussions.  Team conferences with you and your family in attendance may also be held.  Expected length of stay: 2 weeks  Overall anticipated outcome: min assist wheelchair level due to WB issues  Depending on your progress and recovery, your program may change. Your Inpatient Rehabilitation Care Coordinator will coordinate services and will keep you informed of any changes. Your Inpatient Rehabilitation Care Coordinator's name and contact numbers are listed  below.  The following services may also be recommended but are not provided by the Inpatient Rehabilitation Center:  Driving Evaluations Home Health Rehabiltiation Services Outpatient Rehabilitation Services    Arrangements will be made to provide these services after discharge if needed.  Arrangements include referral to agencies that provide these services.  Your insurance has been verified to be:  Medicare part A and BCBS Your primary doctor is:  Kip  Corrington  Pertinent information will be shared with your doctor and your insurance company.  Inpatient Rehabilitation Care Coordinator:  Dossie Der, [place] [phone] or Luna Glasgow  Information discussed with and copy given to patient by: [name], 05/14/2023, 10:24 AM

## 2023-05-14 NOTE — Progress Notes (Signed)
Inpatient Rehabilitation Care Coordinator Assessment and Plan Patient Details  Name: Louis Juarez. 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 Bilateral pleural effusions,  Question mild subpleural reticulation, indicative of fibrosis.  Coronary artery calcification. 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 Wall thickness was increased in a pattern of mild LVH.- Aortic valve: AV is thckened, calcified with no signifi stenosis.The atrium was severely dilated.: Systolic function was moderately reduced.The atrium was mildly dilated.PA peak pre   Nonischemic cardiomyopathy (HCC)     Palpitations    Pneumonia    PVC (premature ventricular contraction) 07/02/2011   PVC's (premature ventricular contractions)    Wheezing 04/29/2011   Sinus ct 05/2011:    Wheezing    Past Surgical History:  Past Surgical History:  Procedure Laterality Date   BI-VENTRICULAR IMPLANTABLE CARDIOVERTER DEFIBRILLATOR N/A 02/13/2012   Procedure: BI-VENTRICULAR IMPLANTABLE CARDIOVERTER DEFIBRILLATOR  (CRT-D);  Surgeon: Hillis Range, MD;  Location[facility] CATH LAB;  Service: Cardiovascular;  Laterality: N/A;   BI-VENTRICULAR IMPLANTABLE CARDIOVERTER DEFIBRILLATOR  (CRT-D)  02/13/2012   BI-VENTRICULAR IMPLANTABLE CARDIOVERTER DEFIBRILLATOR  (CRT-D)  05/03/2019   CHANGEOUT   BIV ICD GENERATOR CHANGEOUT N/A 05/03/2019   Procedure: BIV ICD GENERATOR CHANGEOUT;  Surgeon: Hillis Range, MD;  Location: [facility] INVASIVE CV LAB;  Service: Cardiovascular;  Laterality: N/A;   CARDIAC CATHETERIZATION  2013   CARDIAC DEFIBRILLATOR PLACEMENT  02/13/2012   SJM [employer] BIV ICD implanted by Dr Johney Frame   IRRIGATION AND DEBRIDEMENT ELBOW Right 04/24/2023   Procedure: IRRIGATION AND DEBRIDEMENT ELBOW;  Surgeon: Roby Lofts, MD;  Location: [facility] OR;  Service: Orthopedics;  Laterality: Right;   IRRIGATION AND DEBRIDEMENT ELBOW Right 05/08/2023   Procedure: IRRIGATION AND DEBRIDEMENT ELBOW;  Surgeon: Roby Lofts, MD;  Location: [facility] OR;  Service: Orthopedics;  Laterality: Right;   NASAL SINUS SURGERY  93, 97, 2010, 2006   NASAL SINUS SURGERY     1993, 1997, 2006, 2010   ORIF PELVIC FRACTURE Right 05/08/2023   Procedure: OPEN REDUCTION INTERNAL FIXATION (ORIF) PELVIC FRACTURE;  Surgeon: Roby Lofts, MD;  Location: [facility] OR;  Service:  Orthopedics;  Laterality: Right;   ORIF PELVIC FRACTURE WITH PERCUTANEOUS SCREWS Bilateral 04/24/2023   Procedure: ORIF PELVIC FRACTURE WITH PERCUTANEOUS SCREWS;  Surgeon: Roby Lofts, MD;  Location: [facility] OR;  Service: Orthopedics;  Laterality: Bilateral;   Social History:  reports  that he quit smoking about 21 years ago. His smoking use included cigarettes. He started smoking about 41 years ago. He has a 30 pack-year smoking history. He has never used smokeless tobacco. He reports that he does not drink alcohol and does not use drugs.  Family / Support Systems Marital Status: Married Patient Roles: Spouse, Parent Spouse/Significant Other: Dois Davenport [phone] Children: Son in [place] whois coming to assist Mom with Dad's care at discharge Other Supports: Friends and church members Anticipated Caregiver: Wife and son Ability/Limitations of Caregiver: Wife works but plans on [employer] a FMLA to be available and son coming from [place] to assist Caregiver Availability: 24/7 Family Dynamics: Close with family and friends, he feels he has good supports and his needs will be met at discharge.  Social History Preferred language: English Religion: Catholic Cultural Background: No issues Education: Charity fundraiser - How often do you need to have someone help you when you read instructions, pamphlets, or other written material from your doctor or pharmacy?: Never Writes: Yes Employment Status: Retired Marine scientist Issues: No issues Guardian/Conservator: None-according to MD pt is capable of making his own decisions while here. Wife comes daily at 21 will see her when here today   Abuse/Neglect Abuse/Neglect Assessment Can Be Completed: Yes Physical Abuse: Denies Verbal Abuse: Denies Sexual Abuse: Denies Exploitation of patient/patient's resources: Denies Self-Neglect: Denies  Patient response to: Social Isolation - How often do you feel lonely or isolated from those around you?: Rarely  Emotional Status Pt's affect, behavior and adjustment status: Pt is motivated to do what he can do with his WB issues. He has always been independent and able to take care of himself. He was independent and still drove prior to this fall. He will not be getting on  anymore ladders Recent Psychosocial Issues: other health issues but they were managed and he was independent Psychiatric History: History of anxiety takes medications for this and finds it helpful. He may benefit from seeing neuro-psych while here due to drastic change in his abilities and length of time it will take to heal Substance Abuse History: No issues  Patient / Family Perceptions, Expectations & Goals Pt/Family understanding of illness & functional limitations: Pt and wife can explain his fall and injuries as a result. He and she doe talk with the MD's involved and feel they have  a good understanding of his WB issues and plan moving forward. Premorbid pt/family roles/activities: husband, father retiree, church member, friend, etc Anticipated changes in roles/activities/participation: resume Pt/family expectations/goals: Pt states: " I have learned my lesson no more ladders. " Wife states: " We do what is needed for him to heal."  [employer]: None Premorbid Home Care/DME Agencies: None Transportation available at discharge: self and wife Is the patient able to respond to transportation needs?: Yes In the past 12 months, has lack of transportation kept you from medical appointments or from getting medications?: No In the past 12 months, has lack of transportation kept you from meetings, work, or from getting things needed for daily living?: No Resource referrals recommended: Neuropsychology  Discharge Planning Living Arrangements: Spouse/significant other Support Systems: Spouse/significant other, Children, Friends/neighbors, Church/faith community Type of Residence: Private residence Insurance Resources: Private  Insurance (specify), Medicare Herbalist) Financial Resources: Social Security, Family Support Financial Screen Referred: No Living Expenses: Own Money Management: Patient, Spouse Does the patient have any problems obtaining your medications?:  No Home Management: both Patient/Family Preliminary Plans: Return home with wife and son is coming from [place] to assist with his care. Wife does work but plans to take [employer] and can alos work from home. Aware being evaluated today and goals being set for stay here. Barrier will be his WB issues. Care Coordinator Barriers to Discharge: Weight bearing restrictions Care Coordinator Anticipated Follow Up Needs: HH/OP  Clinical Impression Pleasant gentleman who has learned his lesson regarding ladders. His wife and son are involved and will be assisting at discharge. Will await team's evaluations and work on discharge. Will get input from team regarding need for neuro-psych  [name] 05/14/2023, 10:23 AM

## 2023-05-14 NOTE — Evaluation (Signed)
Occupational Therapy Assessment and Plan  Patient Details  Name: Louis Juarez. 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 Bilateral pleural effusions,  Question mild subpleural reticulation, indicative of fibrosis.  Coronary artery calcification. 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 Wall thickness was increased  in a pattern of mild LVH.- Aortic valve: AV is thckened, calcified with no signifi stenosis.The atrium was severely dilated.: Systolic function was moderately reduced.The atrium was mildly dilated.PA peak pre   Nonischemic cardiomyopathy (HCC)    Palpitations    Pneumonia    PVC (premature ventricular contraction) 07/02/2011   PVC's (premature ventricular contractions)    Wheezing 04/29/2011   Sinus ct 05/2011:    Wheezing    Past Surgical History:  Past Surgical History:  Procedure Laterality Date   BI-VENTRICULAR IMPLANTABLE CARDIOVERTER DEFIBRILLATOR N/A 02/13/2012   Procedure: BI-VENTRICULAR IMPLANTABLE CARDIOVERTER DEFIBRILLATOR  (CRT-D);  Surgeon: Hillis Range, MD;  Location: [facility] CATH LAB;  Service: Cardiovascular;  Laterality: N/A;   BI-VENTRICULAR IMPLANTABLE CARDIOVERTER DEFIBRILLATOR  (CRT-D)  02/13/2012   BI-VENTRICULAR IMPLANTABLE CARDIOVERTER DEFIBRILLATOR  (CRT-D)  05/03/2019   CHANGEOUT   BIV ICD GENERATOR CHANGEOUT N/A 05/03/2019   Procedure: BIV ICD GENERATOR CHANGEOUT;  Surgeon: Hillis Range, MD;  Location: [facility] INVASIVE CV LAB;  Service: Cardiovascular;  Laterality: N/A;   CARDIAC CATHETERIZATION  2013   CARDIAC DEFIBRILLATOR PLACEMENT  02/13/2012   SJM [employer] BIV ICD implanted by Dr Johney Frame   IRRIGATION AND DEBRIDEMENT ELBOW Right 04/24/2023   Procedure: IRRIGATION AND DEBRIDEMENT ELBOW;  Surgeon: Roby Lofts, MD;  Location: [facility] OR;  Service: Orthopedics;  Laterality: Right;   IRRIGATION AND DEBRIDEMENT ELBOW Right 05/08/2023   Procedure: IRRIGATION AND DEBRIDEMENT ELBOW;  Surgeon: Roby Lofts, MD;  Location: [facility] OR;  Service: Orthopedics;  Laterality: Right;   NASAL SINUS SURGERY  93, 97, 2010, 2006  NASAL SINUS SURGERY     1993, 1997, 2006, 2010   ORIF PELVIC FRACTURE Right 05/08/2023   Procedure: OPEN REDUCTION INTERNAL FIXATION (ORIF) PELVIC FRACTURE;  Surgeon: Roby Lofts, MD;  Location: [facility] OR;  Service: Orthopedics;  Laterality: Right;   ORIF  PELVIC FRACTURE WITH PERCUTANEOUS SCREWS Bilateral 04/24/2023   Procedure: ORIF PELVIC FRACTURE WITH PERCUTANEOUS SCREWS;  Surgeon: Roby Lofts, MD;  Location: [facility] OR;  Service: Orthopedics;  Laterality: Bilateral;    Assessment & Plan Clinical Impression: Louis Juarez is an 82 year old male with history of T2DM, NICM, HTN, ICM/CHF s/p AICD who was admitted on 04/24/23 after falling 20 feed off a ladder with complaints of right elbow, right hip pain and was hypotensive in ED. He was found to have unstable pelvic ring fracture with sacral Fx on left, posterior ilium and superior pubic rami fracture on right, right elbow laceration with air in ulnohumeral joint and small comminuted fracture fragments of olecranon tip, right 6-12 rib fractures, right T10 transverse process fracture, hemorrhagic shock, age-indeterminate mild compression fractures of T4, T7 and T12 as well as incidental findings of pulmonary nodules and severe atherosclerotic calcification left carotid bulb and proximal left cervical ICA.  Dr. Dutch Quint evaluated the films and felt that no brace or spine precautions needed.Marland Kitchen  He was taken to OR the same day for percutaneous fixation of right posterior pelvic fractur fracture, left sacrum and right superior pubic rami fracture, open treatment of right olecranon avulsion fracture, I&D right open olecranon fracture and I&D of her right open elbow arthrotomy by Dr. Jena Gauss.  Postop had issues with hypotension, acute blood loss anemia requiring transfusion, urinary retention requiring Foley placement briefly as well as development of hypoxia with decrease in level of consciousness on 10/07.  Head CT was negative for acute changes albumin bolus for hypotension as well as Levophed.Marland Kitchen   He reported worsening of back pain and flank pain bilaterally as well as hematuria and foley replaced.  CT abdomen pelvis repeated on 10/12 due to rising white count as well as abdominal pain and hematuria.  This  revealed thickening of descending and proximal sigmoid colon compatible with acute colitis, increased displacement of right iliac wing fracture, increased moderate right pleural effusions as well as groundglass opacities BLE and patchy consolidation in right base.  Incidental note made of 2.7 cm hemangioma left lobe of liver as well as subcutaneous edema diffusely right greater than left.  He was started on IV Zosyn on 10/13 for possible colitis as well as pulmonary toilet.  Fluid overload treated with Lasix. He was taken back to OR for ORIF pubic symphysis with right superior pubic rami's, ORIF fixation of right posterior pelvic/SI joint and I&D of right elbow infection on 10/18.  Postop to be strict NWB RLE and WBAT LLE for transfers only.  He is WBAT RUE.  Patient currently requires max with basic self-care skills and IADL secondary to muscle weakness and muscle joint tightness, decreased cardiorespiratoy endurance, decreased coordination, decreased memory and delayed processing, and decreased sitting balance, decreased standing balance, decreased balance strategies, and difficulty maintaining precautions.  Prior to hospitalization, patient could complete all aspects of ADL's, mobility, driving and home mngt with independent  level.   Patient will benefit from skilled intervention to decrease level of assist with basic self-care skills, increase independence with basic self-care skills, and increase level of independence with iADL prior to discharge home with care partner.  Anticipate patient will require 24 hour  supervision and minimal physical assistance and follow up home health.   OT Evaluation Precautions/Restrictions  Precautions Precautions: Fall Restrictions Weight Bearing Restrictions: Yes RUE Weight Bearing: Weight bearing as tolerated RLE Weight Bearing: Non weight bearing LLE Weight Bearing: Weight bearing as tolerated Other Position/Activity Restrictions: LLE WBAT for transfers  only  Vital Signs Therapy Vitals Temp: (!) 97.5 F (36.4 C) Temp Source: Oral Pulse Rate: 70 Resp: 20 BP: 137/64 Patient Position (if appropriate): Sitting Oxygen Therapy SpO2: 97 % O2 Device: Room Air Pain Pain Assessment Pain Scale: 0-10 Pain Score: 2  Pain Type: Surgical pain Pain Location: Back Pain Orientation: Right Pain Descriptors / Indicators: Sore;Sharp Pain Frequency: Constant Pain Onset: On-going Pain Intervention(s): Medication (See eMAR) Home Living/Prior Functioning Home Living Family/patient expects to be discharged to:: Private residence Living Arrangements: Spouse/significant other Available Help at Discharge: Family, Available 24 hours/day Type of Home: House Home Access: Stairs to enter Secretary/administrator of Steps: 3 Entrance Stairs-Rails: Can reach both Home Layout: Able to live on main level with bedroom/bathroom Alternate Level Stairs-Number of Steps: does not go upstairs` Bathroom Shower/Tub: Walk-in shower  Lives With: Spouse Prior Function Level of Independence: Independent with transfers, Independent with gait, Independent with homemaking with ambulation  Able to Take Stairs?: Yes Driving: Yes Vision Baseline Vision/History: 1 Wears glasses Ability to See in Adequate Light: 0 Adequate Patient Visual Report: No change from baseline Vision Assessment?: No apparent visual deficits Perception  Perception: Within Functional Limits Praxis Praxis: WFL Cognition Cognition Overall Cognitive Status: Impaired/Different from baseline Arousal/Alertness: Awake/alert Orientation Level: Person;Place;Situation Memory: Impaired Awareness: Impaired Problem Solving: Impaired Safety/Judgment: Impaired Brief Interview for Mental Status (BIMS) Repetition of Three Words (First Attempt): 3 Temporal Orientation: Year: Correct Temporal Orientation: Month: Accurate within 5 days Temporal Orientation: Day: Incorrect Recall: "Sock": Yes, no cue  required Recall: "Blue": Yes, no cue required Recall: "Bed": No, could not recall BIMS Summary Score: 12 Sensation Sensation Light Touch: Appears Intact Hot/Cold: Appears Intact Proprioception: Appears Intact Stereognosis: Appears Intact Coordination Gross Motor Movements are Fluid and Coordinated: No Fine Motor Movements are Fluid and Coordinated: No Coordination and Movement Description: antalgic movement due to pain and fear of pain provocation Finger Nose Finger Test: difficulty on R, relatively smooth on L Motor  Motor Motor: Within Functional Limits  Trunk/Postural Assessment  Cervical Assessment Cervical Assessment: Exceptions to [facility] Thoracic Assessment Thoracic Assessment:  (rounded shoulders) Lumbar Assessment Lumbar Assessment: Exceptions to [facility] Postural Control Postural Control: Deficits on evaluation Righting Reactions: delayed Protective Responses: delayed  Balance Balance Balance Assessed: Yes Static Sitting Balance Static Sitting - Balance Support: Bilateral upper extremity supported Static Sitting - Level of Assistance: 4: Min assist;5: Stand by assistance Static Sitting - Comment/# of Minutes: Dependent on pain levels Dynamic Sitting Balance Dynamic Sitting - Balance Support: Bilateral upper extremity supported;During functional activity Dynamic Sitting - Level of Assistance: 3: Mod assist Sitting balance - Comments: L lean due to pain in rt pelvis, min A to maintain sitting Extremity/Trunk Assessment      Care Tool Care Tool Self Care Eating   Eating Assist Level: Set up assist    Oral Care    Oral Care Assist Level: Minimal Assistance - Patient > 75%    Bathing   Body parts bathed by patient: Right arm;Left arm;Chest;Abdomen;Face     Assist Level: Total Assistance - Patient < 25%    Upper Body Dressing(including orthotics)   What is the patient wearing?: Hospital gown only  Lower Body Dressing (excluding footwear)     Assist  for lower body dressing: Dependent - Patient 0%    Putting on/Taking off footwear   What is the patient wearing?: Non-skid slipper socks Assist for footwear: Dependent - Patient 0%       Care Tool Toileting Toileting activity   Assist for toileting: Dependent - Patient 0%     Care Tool Bed Mobility Roll left and right activity   Roll left and right assist level: Maximal Assistance - Patient 25 - 49%    Sit to lying activity   Sit to lying assist level: Maximal Assistance - Patient 25 - 49%    Lying to sitting on side of bed activity   Lying to sitting on side of bed assist level: the ability to move from lying on the back to sitting on the side of the bed with no back support.: Maximal Assistance - Patient 25 - 49%     Care Tool Transfers Sit to stand transfer Sit to stand activity did not occur: Safety/medical concerns      Chair/bed transfer   Chair/bed transfer assist level: Maximal Assistance - Patient 25 - 49%     Toilet transfer   Assist Level: 2 Helpers     Care Tool Cognition  Expression of Ideas and Wants    Understanding Verbal and Non-Verbal Content     Memory/Recall Ability     Refer to Care Plan for Long Term Goals  SHORT TERM GOAL WEEK 1    Recommendations for other services: Neuropsych, Adult nurse group, Stress management, and Outing/community reintegration, and Other: Speech Therapy for cognition     Skilled Therapeutic Intervention ADL ADL Equipment Provided: Long-handled sponge Eating: Set up Where Assessed-Eating: Wheelchair Grooming: Minimal assistance Where Assessed-Grooming: Sitting at sink;Wheelchair Upper Body Bathing: Moderate assistance Where Assessed-Upper Body Bathing: Wheelchair;Sitting at sink Lower Body Bathing: Dependent Where Assessed-Lower Body Bathing: Sitting at sink;Wheelchair Upper Body Dressing: Moderate assistance Where Assessed-Upper Body Dressing: Sitting at sink Lower Body Dressing:  Dependent Where Assessed-Lower Body Dressing: Wheelchair;Bed level Toileting: Dependent Where Assessed-Toileting: Bed level Toilet Transfer: Unable to assess Tub/Shower Transfer Method: Unable to assess Film/video editor: Unable to assess Film/video editor Method: Unable to assess ADL Comments: Pt in significant pain in back throughout session. Pain meds given prior but needed heavier pain meds not due until 11 am ths provided repositioning, weight shifts during self care and warm cloths on back and LE's elevated with pillow behind back. Sink side self care w/c level only conducted this session due to pain, time constraints and WB status. Mobility  Bed Mobility Bed Mobility: Supine to Sit;Sit to Supine Supine to Sit: Maximal Assistance - Patient - Patient 25-49% Sit to Supine: Maximal Assistance - Patient 25-49%  OT Treatment/Interventions:   Pt seen for full initial OT evaluation and training session this am. Pt in w/c  upon OT arrival. OT introduced role of therapy and purpose of session. Pt open to all presented assessment and training this visit despite pain.  OT assisted and assessed ADL's, mobility, vision, sensation. cognition/lang, G/FMC, strength and balance throughout session. See above for levels. TB transfers for now with 2 persons due to WB status and pain. Pt will benefit from skilled OT services at CIR to maximize function and safety with recommendation to return home with family assistance and HHOT services upon d/c home. Pt left at end of session in w/c with LE's elevated and chair alarm set, tray table  and nurse call bell within reach and hand off to nursing student and nurse for pain meds.   Discharge Criteria: Patient will be discharged from OT if patient refuses treatment 3 consecutive times without medical reason, if treatment goals not met, if there is a change in medical status, if patient makes no progress towards goals or if patient is discharged from  hospital.  The above assessment, treatment plan, treatment alternatives and goals were discussed and mutually agreed upon: by patient  Vicenta Dunning 05/14/2023, 5:14 PM

## 2023-05-14 NOTE — Progress Notes (Signed)
PROGRESS NOTE   Subjective/Complaints: Pt reports he was uncomfortable last night due to positioning of his bed.  He says he did not asked the nurses to help adjust his bed.  Reports constipation and said he had not had recent bowel movement although appears he has been having regular bowel movements per chart.  ROS: Patient denies fever, rash, sore throat, blurred vision, dizziness, nausea, vomiting, diarrhea, cough, shortness of breath  headache, or mood change. + constipation reported + back pain    Objective:   DG Chest 2 View  Result Date: 05/14/2023 CLINICAL DATA:  Wheezing. EXAM: CHEST - 2 VIEW COMPARISON:  05/10/2023 FINDINGS: The lungs are clear without focal pneumonia, edema, pneumothorax or pleural effusion. Interstitial markings are diffusely coarsened with chronic features. The cardiopericardial silhouette is within normal limits for size. No acute bony abnormality. Left-sided permanent pacemaker again noted. IMPRESSION: Chronic interstitial coarsening without acute cardiopulmonary findings. Electronically Signed   By: Kennith Center M.D.   On: 05/14/2023 10:21   Recent Labs    05/13/23 1819 05/14/23 0605  WBC 12.9* 12.9*  HGB 10.4* 8.9*  HCT 33.3* 28.9*  PLT 540* 579*   Recent Labs    05/13/23 0546 05/14/23 0605  NA 135 135  K 4.4 4.2  CL 101 99  CO2 25 26  GLUCOSE 140* 139*  BUN 15 16  CREATININE 0.63 0.78  CALCIUM 9.1 9.3    Intake/Output Summary (Last 24 hours) at 05/14/2023 1300 Last data filed at 05/14/2023 1230 Gross per 24 hour  Intake 540 ml  Output 550 ml  Net -10 ml        Physical Exam: Vital Signs Blood pressure (!) 124/52, pulse 69, temperature 97.9 F (36.6 C), resp. rate 18, height 5\' 10"  (1.778 m), weight 81.7 kg, SpO2 93%.   General: No apparent distress HEENT: MMM, EOMI Neck: Supple without JVD or lymphadenopathy Heart: Reg rate and rhythm.  Chest: Audible wheezing with  upper airway congestion.  - he reports this is chronic  Abdomen: Soft, non-tender, non-distended, bowel sounds positive. Extremities: No clubbing, cyanosis, or edema. Pulses are 2+ Psych: Pt's affect is appropriate. Pt is cooperative Musculoskeletal:     Comments: Noted around R elbow laceration.  Incision erythematous and when elbow lifted and rotated inward; drains synovial fluid mixed that changes to purulent drainage from medial aspect.  Prepubic and right lower quadrant incisions C/D/I skin place.  Moderate noted edema RLQ/flank.  Neurological:     Mental Status: He is oriented to person, place, and time. Follows commands. + memory deficits   Assessment/Plan: 1. Functional deficits which require 3+ hours per day of interdisciplinary therapy in a comprehensive inpatient rehab setting. Physiatrist is providing close team supervision and 24 hour management of active medical problems listed below. Physiatrist and rehab team continue to assess barriers to discharge/monitor patient progress toward functional and medical goals  Care Tool:  Bathing    Body parts bathed by patient: Right arm, Left arm, Chest, Abdomen, Face   Body parts bathed by helper: Front perineal area, Buttocks, Right upper leg, Left upper leg, Right lower leg, Left lower leg     Bathing assist Assist Level: Total  Assistance - Patient < 25%     Upper Body Dressing/Undressing Upper body dressing   What is the patient wearing?: Hospital gown only    Upper body assist Assist Level: Moderate Assistance - Patient 50 - 74%    Lower Body Dressing/Undressing Lower body dressing      What is the patient wearing?: Hospital gown only, Incontinence brief     Lower body assist Assist for lower body dressing: Dependent - Patient 0%     Toileting Toileting    Toileting assist Assist for toileting: Dependent - Patient 0%     Transfers Chair/bed transfer  Transfers assist            Locomotion Ambulation   Ambulation assist              Walk 10 feet activity   Assist           Walk 50 feet activity   Assist           Walk 150 feet activity   Assist           Walk 10 feet on uneven surface  activity   Assist           Wheelchair     Assist               Wheelchair 50 feet with 2 turns activity    Assist            Wheelchair 150 feet activity     Assist          Blood pressure (!) 124/52, pulse 69, temperature 97.9 F (36.6 C), resp. rate 18, height 5\' 10"  (1.778 m), weight 81.7 kg, SpO2 93%.  Medical Problem List and Plan: 1. Functional deficits secondary to polytrauma             -patient may shower             -ELOS/Goals: 14-20 days S             -Continue CIR  -SLP consult for cognition 2.  Antithrombotics: -DVT/anticoagulation:  Pharmaceutical: Lovenox             -antiplatelet therapy: N/AA 3. Pain Management: continue Oxycodone as needed severe pain 4. Mood/Behavior/Sleep: LCSW to follow for evaluation and support             -antipsychotic agents: Seroquel as needed 5. Neuropsych/cognition: This patient is  capable of making decisions on his own behalf. 6. Skin/Wound Care: Routine pressure-relief measures.   --Monitor right elbow for drainage/healing. 7. Fluids/Electrolytes/Nutrition: Monitor I/os.  Continue Ensure supplements.             -- Add vitamin C, zinc and Juven. 8. Pelvic ring fracture s/p percutaneous fixation: Strict NWB RLE and WBAT LLE for transfers only. 9. Right elbow wound drainage: Now on doxycycline.  Reports night sweats.  Cultures sent  -Discussed with Dr. Jena Gauss yesterday who recommended Doxycycline 100 mg twice daily, his team plans to follow-up and check on him and CIR.   10. Acute blood loss anemia: Monitor for any signs of bleeding.  Recheck CBC in AM. 11. Leukocytosis: White count trending down from 23.6-->12.9 12.  Thrombocytosis: Likely reactive.   Continue to monitor 13. Low protein stores: Albumin down to 2.2--supplements added. 14. H/o NICM/HF s/p ICD:  Daily weights/monitor for signs of overload. Change diet to heart healthy -- On Coreg, aldactone, Lasix and Pravachol.             --  decrease Kdur to 20 meq bid as K trending up.  --Repeat chest x-ray in a.m.- no acute findings Filed Weights   05/13/23 1455 05/13/23 1517 05/14/23 0652  Weight: 82.5 kg 82.5 kg 81.7 kg    15. Multiple rib fractures: Encourage pulmonary hygiene. Incentive spirometer ordered   16. Urinary retention: Monitor voiding with PVR checks.  continue Flomax.  -10/24 Required IC this AM, continue to monitor  PVRs  17. Constipation. Appears to be having regular Bms.   -Cont miralax prn  LOS: 1 days A FACE TO FACE EVALUATION WAS PERFORMED  Fanny Dance 05/14/2023, 1:00 PM

## 2023-05-14 NOTE — Plan of Care (Signed)
  Problem: RH Grooming Goal: LTG Patient will perform grooming w/assist,cues/equip (OT) Description: LTG: Patient will perform grooming with assist, with/without cues using equipment (OT) Flowsheets (Taken 05/14/2023 1222) LTG: Pt will perform grooming with assistance level of: Set up assist    Problem: RH Bathing Goal: LTG Patient will bathe all body parts with assist levels (OT) Description: LTG: Patient will bathe all body parts with assist levels (OT) Flowsheets (Taken 05/14/2023 1222) LTG: Pt will perform bathing with assistance level/cueing: Minimal Assistance - Patient > 75%   Problem: RH Dressing Goal: LTG Patient will perform upper body dressing (OT) Description: LTG Patient will perform upper body dressing with assist, with/without cues (OT). Flowsheets (Taken 05/14/2023 1222) LTG: Pt will perform upper body dressing with assistance level of: Set up assist Goal: LTG Patient will perform lower body dressing w/assist (OT) Description: LTG: Patient will perform lower body dressing with assist, with/without cues in positioning using equipment (OT) Flowsheets (Taken 05/14/2023 1222) LTG: Pt will perform lower body dressing with assistance level of: Minimal Assistance - Patient > 75%   Problem: RH Toileting Goal: LTG Patient will perform toileting task (3/3 steps) with assistance level (OT) Description: LTG: Patient will perform toileting task (3/3 steps) with assistance level (OT)  Flowsheets (Taken 05/14/2023 1222) LTG: Pt will perform toileting task (3/3 steps) with assistance level: Minimal Assistance - Patient > 75%   Problem: RH Toilet Transfers Goal: LTG Patient will perform toilet transfers w/assist (OT) Description: LTG: Patient will perform toilet transfers with assist, with/without cues using equipment (OT) Flowsheets (Taken 05/14/2023 1222) LTG: Pt will perform toilet transfers with assistance level of: Minimal Assistance - Patient > 75%   Problem: RH Tub/Shower  Transfers Goal: LTG Patient will perform tub/shower transfers w/assist (OT) Description: LTG: Patient will perform tub/shower transfers with assist, with/without cues using equipment (OT) Flowsheets (Taken 05/14/2023 1222) LTG: Pt will perform tub/shower stall transfers with assistance level of: Minimal Assistance - Patient > 75%

## 2023-05-14 NOTE — Evaluation (Signed)
Physical Therapy Assessment and Plan  Patient Details  Name: Louis Juarez. 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 Bilateral pleural effusions,  Question mild subpleural reticulation, indicative of fibrosis.  Coronary artery calcification. 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 Wall thickness was increased in a pattern of mild LVH.- Aortic valve: AV is thckened, calcified with no  signifi stenosis.The atrium was severely dilated.: Systolic function was moderately reduced.The atrium was mildly dilated.PA peak pre   Nonischemic cardiomyopathy (HCC)    Palpitations    Pneumonia    PVC (premature ventricular contraction) 07/02/2011   PVC's (premature ventricular contractions)    Wheezing 04/29/2011   Sinus ct 05/2011:    Wheezing    Past Surgical History:  Past Surgical History:  Procedure Laterality Date   BI-VENTRICULAR IMPLANTABLE CARDIOVERTER DEFIBRILLATOR N/A 02/13/2012   Procedure: BI-VENTRICULAR IMPLANTABLE CARDIOVERTER DEFIBRILLATOR  (CRT-D);  Surgeon: Hillis Range, MD;  Location[facility] CATH LAB;  Service: Cardiovascular;  Laterality: N/A;   BI-VENTRICULAR IMPLANTABLE CARDIOVERTER DEFIBRILLATOR  (CRT-D)  02/13/2012   BI-VENTRICULAR IMPLANTABLE CARDIOVERTER DEFIBRILLATOR  (CRT-D)  05/03/2019   CHANGEOUT   BIV ICD GENERATOR CHANGEOUT N/A 05/03/2019   Procedure: BIV ICD GENERATOR CHANGEOUT;  Surgeon: Hillis Range, MD;  Location: [facility] INVASIVE CV LAB;  Service: Cardiovascular;  Laterality: N/A;   CARDIAC CATHETERIZATION  2013   CARDIAC DEFIBRILLATOR PLACEMENT  02/13/2012   [facility] [employer] BIV ICD implanted by Dr Johney Frame   IRRIGATION AND DEBRIDEMENT ELBOW Right 04/24/2023   Procedure: IRRIGATION AND DEBRIDEMENT ELBOW;  Surgeon: Roby Lofts, MD;  Location: [facility] OR;  Service: Orthopedics;  Laterality: Right;   IRRIGATION AND DEBRIDEMENT ELBOW Right 05/08/2023   Procedure: IRRIGATION AND DEBRIDEMENT ELBOW;  Surgeon: Roby Lofts, MD;  Location: [facility] OR;  Service: Orthopedics;  Laterality: Right;   NASAL SINUS SURGERY  93, 97, 2010, 2006   NASAL SINUS SURGERY     1993,  1997, 2006, 2010   ORIF PELVIC FRACTURE Right 05/08/2023   Procedure: OPEN REDUCTION INTERNAL FIXATION (ORIF) PELVIC FRACTURE;  Surgeon: Roby Lofts, MD;  Location: [facility] OR;  Service: Orthopedics;  Laterality: Right;   ORIF PELVIC FRACTURE WITH PERCUTANEOUS SCREWS Bilateral 04/24/2023   Procedure: ORIF  PELVIC FRACTURE WITH PERCUTANEOUS SCREWS;  Surgeon: Roby Lofts, MD;  Location: [facility] OR;  Service: Orthopedics;  Laterality: Bilateral;    Assessment & Plan Clinical Impression: Patient is a 82 year old male with history of T2DM, NICM, HTN, ICM/CHF s/p AICD who was admitted on 04/24/23 after falling 20 feed off a ladder with complaints of right elbow, right hip pain and was hypotensive in ED. He was found to have unstable pelvic ring fracture with sacral Fx on left, posterior ilium and superior pubic rami fracture on right, right elbow laceration with air in ulnohumeral joint and small comminuted fracture fragments of olecranon tip, right 6-12 rib fractures, right T10 transverse process fracture, hemorrhagic shock, age-indeterminate mild compression fractures of T4, T7 and T12 as well as incidental findings of pulmonary nodules and severe atherosclerotic calcification left carotid bulb and proximal left cervical ICA.  Dr. Dutch Quint evaluated the films and felt that no brace or spine precautions needed.Marland Kitchen  He was taken to OR the same day for percutaneous fixation of right posterior pelvic fractur fracture, left sacrum and right superior pubic rami fracture, open treatment of right olecranon avulsion fracture, I&D right open olecranon fracture and I&D of her right open elbow arthrotomy by Dr. Jena Gauss.  Postop had issues with hypotension, acute blood loss anemia requiring transfusion, urinary retention requiring Foley placement briefly as well as development of hypoxia with decrease in level of consciousness on 10/07.  Head CT was negative for acute changes albumin bolus for hypotension as well as Levophed.Marland Kitchen   He reported worsening of back pain and flank pain bilaterally as well as hematuria and foley replaced.  CT abdomen pelvis repeated on 10/12 due to rising white count as well as abdominal pain and hematuria.  This revealed thickening of descending and proximal sigmoid colon compatible with acute colitis,  increased displacement of right iliac wing fracture, increased moderate right pleural effusions as well as groundglass opacities BLE and patchy consolidation in right base.  Incidental note made of 2.7 cm hemangioma left lobe of liver as well as subcutaneous edema diffusely right greater than left.  He was started on IV Zosyn on 10/13 for possible colitis as well as pulmonary toilet.  Fluid overload treated with Lasix. He was taken back to OR for ORIF pubic symphysis with right superior pubic rami's, ORIF fixation of right posterior pelvic/SI joint and I&D of right elbow infection on 10/18.  Postop to be strict NWB RLE and WBAT LLE for transfers only.  He is WBAT RUE.   ST elevation with electrolyte abnormalities being monitored.  He was maintained on IV antibiotics through 10/22.  He continues to have drainage from right elbow with recommendations to continue antibiotics..  Mentation has improved and leukocytosis is resolving with white count trending down. He is continues to require mod assist with difficulty remembering WB status.  Patient transferred to CIR on 05/13/2023 .   Patient currently requires max with mobility secondary to muscle weakness, decreased cardiorespiratoy endurance, decreased awareness, decreased problem solving, decreased safety awareness, and decreased memory, and decreased sitting balance, decreased standing balance, decreased postural control, decreased balance strategies, and difficulty maintaining precautions.  Prior to hospitalization, patient was independent  with mobility  and lived with Spouse in a House home.  Home access is 3Stairs to enter.  Patient will benefit from skilled PT intervention to maximize safe functional mobility, minimize fall risk, and decrease caregiver burden for planned discharge home with 24 hour assist.  Anticipate patient will benefit from follow up [facility] at discharge.  PT - End of Session Activity Tolerance: Tolerates 10 - 20 min activity with multiple  rests Endurance Deficit: Yes PT Assessment Rehab Potential (ACUTE/IP ONLY): Good PT Barriers to Discharge: Inaccessible home environment;Home environment access/layout PT Patient demonstrates impairments in the following area(s): Balance;Behavior;Endurance;Motor;Pain;Safety PT Transfers Functional Problem(s): Bed Mobility;Bed to Chair;Car;Furniture PT Locomotion Functional Problem(s): Wheelchair Mobility PT Plan PT Intensity: Minimum of 1-2 x/day ,45 to 90 minutes PT Frequency: 5 out of 7 days PT Duration Estimated Length of Stay: 2 weeks PT Treatment/Interventions: Community education officer;Neuromuscular re-education;Psychosocial support;UE/LE Strength taining/ROM;Stair training;Wheelchair propulsion/positioning;Balance/vestibular training;Discharge planning;Functional electrical stimulation;Pain management;Skin care/wound management;Therapeutic Activities;UE/LE Coordination activities;Cognitive remediation/compensation;Functional mobility training;Patient/family education;Therapeutic Exercise;Splinting/orthotics PT Transfers Anticipated Outcome(s): MinA PT Locomotion Anticipated Outcome(s): Supervision WC Level PT Recommendation Follow Up Recommendations: Home health PT;24 hour supervision/assistance Patient destination: Home Equipment Recommended: To be determined   PT Evaluation Precautions/Restrictions Precautions Precautions: Fall Restrictions Weight Bearing Restrictions: Yes RUE Weight Bearing: Weight bearing as tolerated RLE Weight Bearing: Non weight bearing LLE Weight Bearing: Weight bearing as tolerated Other Position/Activity Restrictions: LLE WBAT for transfers only General Chart Reviewed: Yes Family/Caregiver Present: No Vital SignsTherapy Vitals Temp: (!) 97.5 F (36.4 C) Temp Source: Oral Pulse Rate: 70 Resp: 20 BP: 137/64 Patient Position (if appropriate): Sitting Oxygen Therapy SpO2: 97 % O2 Device: Room Air Pain Pain  Assessment Pain Scale: 0-10 Pain Score: 2  Pain Type: Surgical pain Pain Location: Back Pain Orientation: Right Pain Descriptors / Indicators: Sore;Sharp Pain Frequency: Constant Pain Onset: On-going Patients Stated Pain Goal: 3 Pain Intervention(s): Medication (See eMAR) Multiple Pain Sites: No Pain Interference Pain Interference Pain Effect on Sleep: 4. Almost constantly Pain Interference with Therapy Activities: 4. Almost constantly Pain Interference with Day-to-Day Activities: 4. Almost constantly Home Living/Prior Functioning Home Living Available Help at Discharge: Family;Available 24 hours/day Type of Home: House Home Access: Stairs to enter [employer] of Steps: 3 Entrance Stairs-Rails: Can reach both Home Layout: Able to live on main level with bedroom/bathroom Alternate Level Stairs-Number of Steps: does not go upstairs` Bathroom Shower/Tub: Walk-in shower  Lives With: Spouse Prior Function Level of Independence: Independent with transfers;Independent with gait;Independent with homemaking with ambulation  Able to Take Stairs?: Yes Driving: Yes Vision/Perception  Vision - History Ability to See in Adequate Light: 0 Adequate Perception Perception: Within Functional Limits Praxis Praxis: WFL  Cognition Overall Cognitive Status: Impaired/Different from baseline Arousal/Alertness: Awake/alert Orientation Level: Oriented X4 Memory: Impaired Awareness: Impaired Problem Solving: Impaired Safety/Judgment: Impaired Sensation Sensation Light Touch: Appears Intact Coordination Gross Motor Movements are Fluid and Coordinated: No Fine Motor Movements are Fluid and Coordinated: No Coordination and Movement Description: antalgic movement due to pain and fear of pain provocation Motor  Motor Motor: Within Functional Limits  Trunk/Postural Assessment  Cervical Assessment Cervical Assessment:  (forward head) Thoracic Assessment Thoracic Assessment:   (rounded shoulders) Lumbar Assessment Lumbar Assessment:  (posterior pelvic tilt) Postural Control Postural Control: Deficits on evaluation Righting Reactions: delayed Protective Responses: delayed  Balance Balance Balance Assessed: Yes Static Sitting Balance Static Sitting - Balance Support: Bilateral upper extremity supported Static Sitting - Level of Assistance: 4: Min assist;5: Stand by assistance Static Sitting - Comment/# of Minutes: Dependent on pain levels  Dynamic Sitting Balance Dynamic Sitting - Balance Support: Bilateral upper extremity supported;During functional activity Dynamic Sitting - Level of Assistance: 3: Mod assist Sitting balance - Comments: L lean due to pain in rt pelvis, min A to maintain sitting Extremity Assessment  RUE Assessment RUE Assessment: Exceptions to [facility] Active Range of Motion (AROM) Comments: 0-85 General Strength Comments: 3-/5 prox R UE, Grip  R 45 lbs, LUE Assessment LUE Assessment: Within Functional Limits General Strength Comments: Grip L 80 lbs RLE Assessment RLE Assessment: Exceptions to [facility] General Strength Comments: Not tested secondary to NWB status LLE Assessment LLE Assessment: Exceptions to [facility] General Strength Comments: Grossly 4/5  Care Tool Care Tool Bed Mobility Roll left and right activity   Roll left and right assist level: Maximal Assistance - Patient 25 - 49%    Sit to lying activity   Sit to lying assist level: Maximal Assistance - Patient 25 - 49%    Lying to sitting on side of bed activity   Lying to sitting on side of bed assist level: the ability to move from lying on the back to sitting on the side of the bed with no back support.: Maximal Assistance - Patient 25 - 49%     Care Tool Transfers Sit to stand transfer Sit to stand activity did not occur: Safety/medical concerns      Chair/bed transfer   Chair/bed transfer assist level: Maximal Assistance - Patient 25 - 49%    Car transfer Car transfer  activity did not occur: Safety/medical concerns        Care Tool Locomotion Ambulation Ambulation activity did not occur: Safety/medical concerns        Walk 10 feet activity Walk 10 feet activity did not occur: Safety/medical concerns       Walk 50 feet with 2 turns activity Walk 50 feet with 2 turns activity did not occur: Safety/medical concerns      Walk 150 feet activity Walk 150 feet activity did not occur: Safety/medical concerns      Walk 10 feet on uneven surfaces activity Walk 10 feet on uneven surfaces activity did not occur: Safety/medical concerns      Stairs Stair activity did not occur: Safety/medical concerns        Walk up/down 1 step activity Walk up/down 1 step or curb (drop down) activity did not occur: Safety/medical concerns      Walk up/down 4 steps activity Walk up/down 4 steps activity did not occur: Safety/medical concerns      Walk up/down 12 steps activity Walk up/down 12 steps activity did not occur: Safety/medical concerns      Pick up small objects from floor Pick up small object from the floor (from standing position) activity did not occur: Safety/medical concerns      Wheelchair Is the patient using a wheelchair?: Yes Type of Wheelchair: Manual   Wheelchair assist level: Minimal Assistance - Patient > 75% Max wheelchair distance: 175'  Wheel 50 feet with 2 turns activity   Assist Level: Minimal Assistance - Patient > 75%  Wheel 150 feet activity   Assist Level: Minimal Assistance - Patient > 75%    Refer to Care Plan for Long Term Goals  SHORT TERM GOAL WEEK 1 PT Short Term Goal 1 (Week 1): Pt will complete bed mobility with modA. PT Short Term Goal 2 (Week 1): Pt will complete bed to chair transfer with modA. PT Short Term Goal 3 (Week 1): Pt will accurately recall and maintain WB  precautions.  Recommendations for other services: None   1st Session: Evaluation completed (see details above and below) with education on PT POC  and goals and individual treatment initiated with focus on bed mobility, balance, transfers, and WC mobility. Pt received semi reclined in bed and agrees to therapy. Reports pain in Rt side and RLE. PT provides rest breaks, mobility, and alerts RN to pain levels, and RN provides pt with pain meds during session. Pt performs supine to sit with maxA and cues for sequencing and body mechanics. PT provides education on WB precautions prior to additional mobility. Pt performs slideboard transfer to the Lt from bed to [facility] with maxA and cues for body mechanics, hand placement, initiation, and sequencing. Pt requires significantly increased time following transfer to recover from pain provocation. Pt then self propels WC x100' with bilateral upper extremities and cues for optimal propulsion technique. Pt left seated in WC with alarm intact and all needs within reach.   2nd session: Pt received seated in [facility] and agrees to therapy. Reports 9/10 pain in Rt low back during session. PT provides repositioning, rest breaks, and alerts RN to pain, who arrives to provide pt with pain meds during session. WC transport to gym. Pt self propels WC 2x175' with bilateral upper extremities and seated rest break. PT provides MinA to prevent pt from veering to the Rt due to tendency to push more effectively with LUE than RUE. Pt educated on proper propulsion technique. Pt requests to sit on toilet for bowel movement. WC transport back to room. Slideboard transfer from [facility] to drop arm commode with totalA required. Pt has significant pain provocation during transfer and has trunk and hip extension in response to pain, pushing hips toward edge of board. PT provides max cueing for safety and lifts hips to [facility] for safety. Pt requires close supervision while on BSC due to Lt lean and poor postural control secondary to pain. Following, pt performs sit to squat position with LLE and maxA from PT, as RN assists with pericare. Pt performs squat pivot  transfer to bed with maxA +2 and cues for sequencing, body mechanics, and initiation. Pt left with RN and NT in room assisting with additional pericare in bed.    Skilled Therapeutic Intervention Mobility Bed Mobility Bed Mobility: Supine to Sit;Sit to Supine Supine to Sit: Maximal Assistance - Patient - Patient 25-49% Sit to Supine: Maximal Assistance - Patient 25-49% Transfers Transfers: Lateral/Scoot Transfers Lateral/Scoot Transfers: Maximal Assistance - Patient 25-49% Transfer (Assistive device):  (Slide board) Locomotion  Gait Ambulation: No Gait Gait: No Stairs / Additional Locomotion Stairs: No Corporate treasurer: Yes Wheelchair Assistance: Minimal assistance - Patient >75% Wheelchair Propulsion: Both upper extremities Wheelchair Parts Management: Needs assistance Distance: 175'   Discharge Criteria: Patient will be discharged from PT if patient refuses treatment 3 consecutive times without medical reason, if treatment goals not met, if there is a change in medical status, if patient makes no progress towards goals or if patient is discharged from hospital.  The above assessment, treatment plan, treatment alternatives and goals were discussed and mutually agreed upon: by patient  Beau Fanny, PT, DPT 05/14/2023, 4:23 PM

## 2023-05-15 DIAGNOSIS — R339 Retention of urine, unspecified: Secondary | ICD-10-CM | POA: Diagnosis not present

## 2023-05-15 DIAGNOSIS — K5900 Constipation, unspecified: Secondary | ICD-10-CM | POA: Diagnosis not present

## 2023-05-15 DIAGNOSIS — T1490XA Injury, unspecified, initial encounter: Secondary | ICD-10-CM | POA: Diagnosis not present

## 2023-05-15 DIAGNOSIS — S32810D Multiple fractures of pelvis with stable disruption of pelvic ring, subsequent encounter for fracture with routine healing: Secondary | ICD-10-CM

## 2023-05-15 DIAGNOSIS — S51001D Unspecified open wound of right elbow, subsequent encounter: Secondary | ICD-10-CM | POA: Diagnosis not present

## 2023-05-15 LAB — BASIC METABOLIC PANEL
Anion gap: 9 (ref 5–15)
BUN: 21 mg/dL (ref 8–23)
CO2: 24 mmol/L (ref 22–32)
Calcium: 8.6 mg/dL — ABNORMAL LOW (ref 8.9–10.3)
Chloride: 99 mmol/L (ref 98–111)
Creatinine, Ser: 0.84 mg/dL (ref 0.61–1.24)
GFR, Estimated: >60 mL/min (ref >60)
Glucose, Bld: 135 mg/dL — ABNORMAL HIGH (ref 70–99)
Potassium: 4.4 mmol/L (ref 3.5–5.1)
Sodium: 132 mmol/L — ABNORMAL LOW (ref 135–145)

## 2023-05-15 MED ORDER — OXYCODONE HCL 5 MG PO TABS
5.0000 mg | ORAL_TABLET | Freq: Every day | ORAL | Status: DC
  Administered 2023-05-16 – 2023-05-19 (×4): 5 mg via ORAL
  Filled 2023-05-15 (×4): qty 1

## 2023-05-15 MED ORDER — GABAPENTIN 100 MG PO CAPS
100.0000 mg | ORAL_CAPSULE | Freq: Three times a day (TID) | ORAL | Status: DC
  Administered 2023-05-15 – 2023-05-25 (×30): 100 mg via ORAL
  Filled 2023-05-15 (×30): qty 1

## 2023-05-15 NOTE — Progress Notes (Signed)
PROGRESS NOTE   Subjective/Complaints: Orthopedic team in the room today.  Wound VAC started for his right elbow so that drainage could be monitored.  Patient reports some pain in his right pelvis.  He reports this does not hurt much at rest (2/10 currently) but will hurt with activity.  ROS: Patient denies fever, rash, sore throat, blurred vision, dizziness, nausea, vomiting, diarrhea, cough, shortness of breath  headache, or mood change. Denies constipation today + Right pelvis area pain  Objective:   DG Chest 2 View  Result Date: 05/14/2023 CLINICAL DATA:  Wheezing. EXAM: CHEST - 2 VIEW COMPARISON:  05/10/2023 FINDINGS: The lungs are clear without focal pneumonia, edema, pneumothorax or pleural effusion. Interstitial markings are diffusely coarsened with chronic features. The cardiopericardial silhouette is within normal limits for size. No acute bony abnormality. Left-sided permanent pacemaker again noted. IMPRESSION: Chronic interstitial coarsening without acute cardiopulmonary findings. Electronically Signed   By: Kennith Center M.D.   On: 05/14/2023 10:21   Recent Labs    05/13/23 1819 05/14/23 0605  WBC 12.9* 12.9*  HGB 10.4* 8.9*  HCT 33.3* 28.9*  PLT 540* 579*   Recent Labs    05/13/23 0546 05/14/23 0605  NA 135 135  K 4.4 4.2  CL 101 99  CO2 25 26  GLUCOSE 140* 139*  BUN 15 16  CREATININE 0.63 0.78  CALCIUM 9.1 9.3    Intake/Output Summary (Last 24 hours) at 05/15/2023 1427 Last data filed at 05/15/2023 1300 Gross per 24 hour  Intake 700 ml  Output 50 ml  Net 650 ml        Physical Exam: Vital Signs Blood pressure 137/60, pulse 72, temperature 97.9 F (36.6 C), temperature source Oral, resp. rate 20, height 5\' 10"  (1.778 m), weight 81.2 kg, SpO2 99%.   General: No apparent distress HEENT: MMM, EOMI Neck: Supple without JVD or lymphadenopathy Heart: Reg rate and rhythm.  Chest: Audible  wheezing with upper airway congestion.  -Improved today and he reports this is chronic  Abdomen: Soft, non-tender, non-distended, bowel sounds positive. Extremities: No clubbing, cyanosis, or edema. Pulses are 2+ Psych: Pt's affect is appropriate. Pt is cooperative Musculoskeletal:     Comments: Wound VAC in place to R elbow laceration-no drainage thus far.   Prepubic and right lower quadrant incisions C/D/I skin place.  Moderate noted edema RLQ/flank.  No significant right thigh tenderness noted today Minimal pain with right hip passive ROM Neurological:     Mental Status: He is oriented to person, place, and time. Follows commands. + memory deficits   Assessment/Plan: 1. Functional deficits which require 3+ hours per day of interdisciplinary therapy in a comprehensive inpatient rehab setting. Physiatrist is providing close team supervision and 24 hour management of active medical problems listed below. Physiatrist and rehab team continue to assess barriers to discharge/monitor patient progress toward functional and medical goals  Care Tool:  Bathing    Body parts bathed by patient: Right arm, Left arm, Chest, Abdomen, Face   Body parts bathed by helper: Front perineal area, Buttocks, Right upper leg, Left upper leg, Right lower leg, Left lower leg     Bathing assist Assist Level: Total Assistance -  Patient < 25%     Upper Body Dressing/Undressing Upper body dressing   What is the patient wearing?: Hospital gown only    Upper body assist Assist Level: Moderate Assistance - Patient 50 - 74%    Lower Body Dressing/Undressing Lower body dressing      What is the patient wearing?: Hospital gown only, Incontinence brief     Lower body assist Assist for lower body dressing: Dependent - Patient 0%     Toileting Toileting    Toileting assist Assist for toileting: Dependent - Patient 0%     Transfers Chair/bed transfer  Transfers assist     Chair/bed transfer assist  level: Maximal Assistance - Patient 25 - 49%     Locomotion Ambulation   Ambulation assist   Ambulation activity did not occur: Safety/medical concerns          Walk 10 feet activity   Assist  Walk 10 feet activity did not occur: Safety/medical concerns        Walk 50 feet activity   Assist Walk 50 feet with 2 turns activity did not occur: Safety/medical concerns         Walk 150 feet activity   Assist Walk 150 feet activity did not occur: Safety/medical concerns         Walk 10 feet on uneven surface  activity   Assist Walk 10 feet on uneven surfaces activity did not occur: Safety/medical concerns         Wheelchair     Assist Is the patient using a wheelchair?: Yes Type of Wheelchair: Manual    Wheelchair assist level: Minimal Assistance - Patient > 75% Max wheelchair distance: 175'    Wheelchair 50 feet with 2 turns activity    Assist        Assist Level: Minimal Assistance - Patient > 75%   Wheelchair 150 feet activity     Assist      Assist Level: Minimal Assistance - Patient > 75%   Blood pressure 137/60, pulse 72, temperature 97.9 F (36.6 C), temperature source Oral, resp. rate 20, height 5\' 10"  (1.778 m), weight 81.2 kg, SpO2 99%.  Medical Problem List and Plan: 1. Functional deficits secondary to polytrauma             -patient may shower             -ELOS/Goals: 14-20 days S with PT/OT             -Continue CIR  -SLP consult for cognition-felt to be at baseline, slums score of 21 out of 30 2.  Antithrombotics: -DVT/anticoagulation:  Pharmaceutical: Lovenox, Eliquis 2.5 mg twice daily for 30 days on discharge for DVT prophylaxis             -antiplatelet therapy: N/AA 3. Pain Management: continue Oxycodone as needed severe pain  -10/25 Will schedule oxycodone 5 mg at 7 AM, gabapentin 100 mg 3 times daily started 4. Mood/Behavior/Sleep: LCSW to follow for evaluation and support             -antipsychotic  agents: Seroquel as needed 5. Neuropsych/cognition: This patient is  capable of making decisions on his own behalf. 6. Skin/Wound Care: Routine pressure-relief measures.   --Monitor right elbow for drainage/healing. 7. Fluids/Electrolytes/Nutrition: Monitor I/os.  Continue Ensure supplements.             -- Add vitamin C, zinc and Juven. 8. Pelvic ring fracture s/p percutaneous fixation: Strict NWB RLE and WBAT LLE  for transfers only. 9. Right elbow wound drainage: Now on doxycycline.  Reports night sweats.  Cultures sent  -Discussed with Dr. Jena Gauss yesterday who recommended Doxycycline 100 mg twice daily, his team plans to follow-up and check on him and CIR.    -10/25 wound VAC started by Ortho appreciate assistance, continue current antibiotics, will recheck labs tomorrow 10. Acute blood loss anemia: Monitor for any signs of bleeding.  Recheck CBC in AM. 11. Leukocytosis: White count trending down from 23.6-->12.9 12.  Thrombocytosis: Likely reactive.  Continue to monitor 13. Low protein stores: Albumin down to 2.2--supplements added. 14. H/o NICM/HF s/p ICD:  Daily weights/monitor for signs of overload. Change diet to heart healthy.  No signs of fluid overload -- On Coreg, aldactone, Lasix and Pravachol.             --decrease Kdur to 20 meq bid as K trending up.  --Repeat chest x-ray in a.m.- no acute findings Filed Weights   05/13/23 1517 05/14/23 0652 05/15/23 0537  Weight: 82.5 kg 81.7 kg 81.2 kg    15. Multiple rib fractures: Encourage pulmonary hygiene. Incentive spirometer ordered   16. Urinary retention: Monitor voiding with PVR checks.  continue Flomax.  -10/24 Required IC this AM, continue to monitor  PVRs  -Timed voids every 4 hours, consider UA if urinary symptoms worsen  17. Constipation. Appears to be having regular Bms.   -Cont miralax prn  -10/25 last BM today improved  LOS: 2 days A FACE TO FACE EVALUATION WAS PERFORMED  Fanny Dance 05/15/2023, 2:27 PM

## 2023-05-15 NOTE — Progress Notes (Signed)
Occupational Therapy Session Note  Patient Details  Name: Louis Juarez. MRN: [id number] Date of Birth: [birth year]  Today's Date: 05/15/2023 OT Individual Time: 1001-1100 OT Individual Time Calculation (min): 59 min    Short Term Goals: Week 1:  OT Short Term Goal 1 (Week 1): Pt will transfer with TB to [facility] with mod A to and from L side OT Short Term Goal 2 (Week 1): Pt will complete LB dressing with AE at EOB with mod a OT Short Term Goal 3 (Week 1): Pt will complete grooming sinkside seated with S w/c level  Skilled Therapeutic Interventions/Progress Updates:   Pt seen for skilled OT this am now with R elbow wound vac. Pt just getting pain meds with nursing and kpad set up. OT moved pt to EOB for grooming and gown change with max A and cues supine to and from sit. Mod A for sitting balance support for tasks. Poor pain control with pt request to be laid back down. ST care coord with ST not picking up for STM deficits as wife reports were baseline but worsened but prefers use of memory log/pad for info in writing vs ST ongoing tx. Pt asked repetitive questions re: when is wife coming, did I have my pain meds and reports "brain fog". Wife arrived once settled back in bed and reported STM deficits as well was baseline and worsened. Wife preferring no ramp and having ambulance transport pt into home. OT relayed need then to alert EMS that w/c bound pt in home and need for DABSC, w/c, cushion, TB and HHOT. Wife agreeable. Pt left bed level with OT turning heated kpad to cool as per pt request, needs, nurse call button and bed alarm set.   Pain: pain meds given, warm hair shampoo cap provided, repositioning and kpad applied   Therapy Documentation Precautions:  Precautions Precautions: Fall Restrictions Weight Bearing Restrictions: Yes RUE Weight Bearing: Weight bearing as tolerated RLE Weight Bearing: Non weight bearing LLE Weight Bearing: Weight bearing as tolerated Other  Position/Activity Restrictions: LLE WBAT for transfers only   Therapy/Group: Individual Therapy  Vicenta Dunning 05/15/2023, 7:47 AM

## 2023-05-15 NOTE — Plan of Care (Signed)
  Problem: Consults Goal: RH GENERAL PATIENT EDUCATION Description: See Patient Education module for education specifics. Outcome: Progressing Goal: Skin Care Protocol Initiated - if Braden Score 18 or less Description: If consults are not indicated, leave blank or document N/A Outcome: Progressing   Problem: RH BOWEL ELIMINATION Goal: RH STG MANAGE BOWEL WITH ASSISTANCE Description: STG Manage Bowel with min Assistance. Outcome: Progressing Goal: RH STG MANAGE BOWEL W/MEDICATION W/ASSISTANCE Description: STG Manage Bowel with Medication with min Assistance. Outcome: Progressing   Problem: RH BLADDER ELIMINATION Goal: RH STG MANAGE BLADDER WITH ASSISTANCE Description: STG Manage Bladder With min Assistance Outcome: Progressing Goal: RH STG MANAGE BLADDER WITH MEDICATION WITH ASSISTANCE Description: STG Manage Bladder With Medication With min Assistance. Outcome: Progressing   Problem: RH SKIN INTEGRITY Goal: RH STG SKIN FREE OF INFECTION/BREAKDOWN Description: Incisions will continue to heal and be free of infection/breakdown with min assist  Outcome: Progressing Goal: RH STG MAINTAIN SKIN INTEGRITY WITH ASSISTANCE Description: STG Maintain Skin Integrity With min Assistance. Outcome: Progressing Goal: RH STG ABLE TO PERFORM INCISION/WOUND CARE W/ASSISTANCE Description: STG Able To Perform Incision/Wound Care With min Assistance. Outcome: Progressing   Problem: RH SAFETY Goal: RH STG ADHERE TO SAFETY PRECAUTIONS W/ASSISTANCE/DEVICE Description: STG Adhere to Safety Precautions With cueing Assistance/Device. Outcome: Progressing Goal: RH STG DECREASED RISK OF FALL WITH ASSISTANCE Description: STG Decreased Risk of Fall With min Assistance. Outcome: Progressing   Problem: RH PAIN MANAGEMENT Goal: RH STG PAIN MANAGED AT OR BELOW PT'S PAIN GOAL Description: Less than 4 with PRN medications min assist  Outcome: Progressing   Problem: RH KNOWLEDGE DEFICIT GENERAL Goal: RH  STG INCREASE KNOWLEDGE OF SELF CARE AFTER HOSPITALIZATION Description: Patient/caregiver will be able to manage medications, self care, weight bearing precautions, and diet/lifestyle modifications to improve health from nursing education, nursing handouts, and other resources independently  Outcome: Progressing   Problem: RH PAIN MANAGEMENT Goal: RH STG PAIN MANAGED AT OR BELOW PT'S PAIN GOAL Description: Less than 4 with PRN medications min assist  Outcome: Progressing   Problem: RH SAFETY Goal: RH STG DECREASED RISK OF FALL WITH ASSISTANCE Description: STG Decreased Risk of Fall With min Assistance. Outcome: Progressing   Problem: RH SAFETY Goal: RH STG ADHERE TO SAFETY PRECAUTIONS W/ASSISTANCE/DEVICE Description: STG Adhere to Safety Precautions With cueing Assistance/Device. Outcome: Progressing

## 2023-05-15 NOTE — Progress Notes (Signed)
Physical Therapy Session Note  Patient Details  Name: Louis Juarez. MRN: [id number] Date of Birth: [birth year]  Today's Date: 05/15/2023 PT Individual Time: 1305-1410 PT Individual Time Calculation (min): 65 min   Short Term Goals: Week 1:  PT Short Term Goal 1 (Week 1): Pt will complete bed mobility with modA. PT Short Term Goal 2 (Week 1): Pt will complete bed to chair transfer with modA. PT Short Term Goal 3 (Week 1): Pt will accurately recall and maintain WB precautions.  Skilled Therapeutic Interventions/Progress Updates: Patient supine in bed on entrance to room. Patient alert and agreeable to PT session but reported not wanting to be in pain as pt is currently not in pain from not moving. Pt educated the benefits of movement and PT during the healing process as it increases blood flow, decreased stiffness of joints and decreases risk of musculature weakness. Pt also informed that pain medication is on a schedule, and that it will become aligned with therapy services shortly as to increase participation by decreasing pain throughout (RN notified at end of session to further converse with pt on pain management). PTA built rapport with pt and discussed today's goal (standing to RW with precautions in mind - pt able to recall precautions - and potentially transfer to [facility]). Pt required change of brief due to urinary incontinence. Pt supine to R and L sidelying with VC provided for use of HOB railing and B UE's to assist (CGA/supervision). PTA doffed/donned hospital brief (NT notified). Pt then supine to sit on HOB with heavy modA and VC for sequence, but pt had to lay back down due to pain. PTA provided max/heavy maxA to get to edge of bed to decrease pt's pain in order to continue towards intervention of standing to RW. Pt sitting EOB with reports of 9/10 pain and requested to lay back down due to. Pt with + 2 assist from NT for safety with PTA managing pt's upper body, and NT on pt's lower body  to get pt back to supine. Pt then required to be scooted to [facility] via chuck pad + 2 and in Trendelenburg. Pt requested to have session ceased due to pain from movement (RN alerted). Pt encouraged to ask nsg for pain medication prior to breakfast and lunch to ensure pain decrease for increased therapy participation. Pt understood.  Pt also repeated questions throughout session, mainly involving pain management and today's goal for therapy.  Patient supine in bed at end of session with brakes locked, bed alarm set, and all needs within reach.      Therapy Documentation Precautions:  Precautions Precautions: Fall Restrictions Weight Bearing Restrictions: Yes RUE Weight Bearing: Weight bearing as tolerated RLE Weight Bearing: Non weight bearing LLE Weight Bearing: Weight bearing as tolerated Other Position/Activity Restrictions: LLE WBAT for transfers only  Therapy/Group: Individual Therapy  Yesenia Locurto PTA 05/15/2023, 4:04 PM

## 2023-05-15 NOTE — Progress Notes (Signed)
Patient ID: Louis Juarez., male   DOB: [birth year], 82 y.o.   MRN: [id number] Follow up with patient on pain control; new wound vac on right elbow. Ortho to follow and re-eval next week (Wed) for ? wound vac removal. Set up k-pad for back pain when in bed. Reviewed positioning and need for self advocacy; requesting pain medication.  Bowel movement noted this morning; reviewed management of constipation.  Continue to follow along to discharge for educational needs. Pamelia Hoit

## 2023-05-15 NOTE — Progress Notes (Signed)
Orthopaedic Trauma Progress Note  SUBJECTIVE: Doing well today. Notes pain in the right side of his pelvis.  No significant pain in the right elbow.  Still having some issues with drainage from the right elbow, dressing has been changed regularly. No chest pain. No SOB. No nausea/vomiting. No other complaints.   OBJECTIVE:  Vitals:   05/15/23 0537 05/15/23 0801  BP: (!) 141/68 129/62  Pulse: 75 78  Resp: 15   Temp: 98 F (36.7 C)   SpO2: 94%     General: Sitting up in bed, NAD Respiratory: No increased work of breathing.  RUE: Dressing removed, incision with serosanguineous drainage.  Less erythema from previous pictures.  Non-tender throughout extremity. Motor and sensory function intact distally. Hand warm and well perfused Pelvis/BLE: Incisions R anterior and lateral hip are CDI. Non-tender over these areas. Ankle DF/PF intact bilaterally. +EHL/FHL. Endorses sensation to light touch all aspect of foot bilaterally. +DP pulses, equal  IMAGING: Stable post op imaging.   LABS:  No results found for this or any previous visit (from the past 24 hour(s)).   ASSESSMENT: Severt Amore. is a 82 y.o. male s/p REVISION FIXATION PELVIS 05/08/2023 REPEAT IRRIGATION AND DEBRIDEMENT RIGHT ELBOW 05/08/2023  CV/Blood loss: Acute blood loss anemia, Hgb 8.9 on 05/14/2023.  Hemodynamically stable  PLAN: Weightbearing:  RLE - NWB RLE LLE - WBAT for transfers only  RUE - WBAT ROM: BLE -unrestricted hip/knee/ankle ROM RUE -unrestricted ROM Incisional and dressing care: Okay to leave RLE incisions open to the air.   Showering: Okay to get lower extremity incisions wet.  Avoid getting incisional wound VAC RUE wet Orthopedic device(s): Wound Vac: RLE   Pain management: Continue current regimen VTE prophylaxis:  Lovenox , SCDs ID: Continue doxycycline 100 mg twice daily Impediments to Fracture Healing: Vit D level 29, continue supplementation.    Dispo: I have placed incisional wound VAC  over right elbow today, will leave this in place x 1 week.  Continue with doxycycline 100 mg twice daily for at least 7 days from the start (estimated end date 05/20/2023)  D/C recommendations: - Oxycodone, Robaxin for pain control - Eliquis 2.5 mg BID x 30 days for DVT prophylaxis -Continue 2000 mg Vit D supplementation daily x 90 days  Follow - up plan: Will plan to reevaluate patient next week, likely on Wednesday and remove incisional wound VAC at that time.  Will make determination if additional oral ABX are required.  Contact information:  Truitt Merle MD, Thyra Breed PA-C. After hours and holidays please check [email or web address] for group call information for [facility]   Thompson Caul, PA-C [phone] (office) [email or web address]

## 2023-05-15 NOTE — Evaluation (Signed)
Speech Language Pathology Assessment and Plan  Patient Details  Name: Louis Juarez. 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 Bilateral pleural effusions,  Question mild subpleural reticulation, indicative of fibrosis.  Coronary artery calcification. 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 Wall thickness was increased in a pattern of mild LVH.- Aortic valve: AV is thckened, calcified with no signifi stenosis.The atrium was severely  dilated.: Systolic function was moderately reduced.The atrium was mildly dilated.PA peak pre   Nonischemic cardiomyopathy (HCC)    Palpitations    Pneumonia    PVC (premature ventricular contraction) 07/02/2011   PVC's (premature ventricular contractions)    Wheezing 04/29/2011   Sinus ct 05/2011:    Wheezing    Past Surgical History:  Past Surgical History:  Procedure Laterality Date   BI-VENTRICULAR IMPLANTABLE CARDIOVERTER DEFIBRILLATOR N/A 02/13/2012   Procedure: BI-VENTRICULAR IMPLANTABLE CARDIOVERTER DEFIBRILLATOR  (CRT-D);  Surgeon: Hillis Range, MD;  Location: [facility] CATH LAB;  Service: Cardiovascular;  Laterality: N/A;   BI-VENTRICULAR IMPLANTABLE CARDIOVERTER DEFIBRILLATOR  (CRT-D)  02/13/2012   BI-VENTRICULAR IMPLANTABLE CARDIOVERTER DEFIBRILLATOR  (CRT-D)  05/03/2019   CHANGEOUT   BIV ICD GENERATOR CHANGEOUT N/A 05/03/2019   Procedure: BIV ICD GENERATOR CHANGEOUT;  Surgeon: Hillis Range, MD;  Location: [facility] INVASIVE CV LAB;  Service: Cardiovascular;  Laterality: N/A;   CARDIAC CATHETERIZATION  2013   CARDIAC DEFIBRILLATOR PLACEMENT  02/13/2012   SJM [employer] BIV ICD implanted by Dr Johney Frame   IRRIGATION AND DEBRIDEMENT ELBOW Right 04/24/2023   Procedure: IRRIGATION AND DEBRIDEMENT ELBOW;  Surgeon: Roby Lofts, MD;  Location: [facility] OR;  Service: Orthopedics;  Laterality: Right;   IRRIGATION AND DEBRIDEMENT ELBOW Right 05/08/2023   Procedure: IRRIGATION AND DEBRIDEMENT ELBOW;  Surgeon: Roby Lofts, MD;  Location: [facility] OR;  Service: Orthopedics;  Laterality: Right;   NASAL SINUS SURGERY  93, 97, 2010, 2006   NASAL SINUS SURGERY     1993, 1997, 2006, 2010   ORIF PELVIC FRACTURE Right  05/08/2023   Procedure: OPEN REDUCTION INTERNAL FIXATION (ORIF) PELVIC FRACTURE;  Surgeon: Roby Lofts, MD;  Location: [facility] OR;  Service: Orthopedics;  Laterality: Right;   ORIF PELVIC FRACTURE WITH PERCUTANEOUS SCREWS Bilateral 04/24/2023   Procedure: ORIF PELVIC FRACTURE WITH PERCUTANEOUS SCREWS;   Surgeon: Roby Lofts, MD;  Location: [facility] OR;  Service: Orthopedics;  Laterality: Bilateral;    Assessment / Plan / Recommendation Clinical Impression Patient is an 82 year old male with medical hx significant for: HF with ICD . Pt [facility] on 04/24/23 after a fall off a ladder as he was cutting limbs. Pt fell ~20 feet. Pt c/o pain in right elbow and right hip. Pelvic x-ray showed open book pelvic fracture. Pelvic binder placed. Imaging also revealed an open right elbow/olecranon fx, right 6-12 th rib fxs, right T10 TP fxs, age-indeterminate mild compression fxs of T 4, T7, T12. Incidental finding of pulmonary nodules and severe atherosclerotic calcifications of left carotid bulb and proximal left cervical ICA. Pt initially placed on 2 L O2 but increased to 100% nonrebreather. Orthopedics consulted. Pt underwent percutaneous fixation of pelvis and I&D of right olecranon fx on 04/24/23 by Dr. Jena Gauss.  On 10/18 to OR for loss of fixation to pelvis. Underwent ORIF pubic symphysis and right superior pubic ramus. ORIF right posterior pelvis/SI joint. Also I and D of right elbow infections. Therapy evaluations completed with recommendations for CIR. Patient admitted 05/13/23. SLP consulted per request from PT/OT due to due to cognitive deficits.   Upon arrival, patient was awake and alert in bed with reports of discomfort but without pain. SLP administered the [facility] Mental Status Examination (SLUMS). Patient scored  21/30 points with a score of 27 or above considered normal, demonstrating a mild cognitive impairment with deficits in short-term recall. Educated patient regarding results who reports he feels memory is at baseline but can be impacted at times by pain and the medications he is currently taking for pain management. SLP called the patient's wife who reports patient has mild baseline memory deficits which he manages at home by utilizing written aids. Patient and  his wife feel patient is at his cognitive baseline and would not like to pursue skilled SLP intervention at this time. SLP verbalized understanding and educated treatment team regarding recommendations from wife to assist with functional recall with use of written aids.      Skilled Therapeutic Interventions          Administered a cognitive-linguistic evaluation, please see above for details.   SLP Assessment  Patient does not need any further Speech Lanaguage Pathology Services    Recommendations  Patient destination: Home Follow up Recommendations: 24 hour supervision/assistance Equipment Recommended: None recommended by SLP    SLP Frequency N/A  SLP Duration  SLP Intensity  SLP Treatment/Interventions N/A  N/A  N/A   Pain Pain Assessment Pain Score: 0-No pain   SLP Evaluation Cognition Overall Cognitive Status: History of cognitive impairments - at baseline Arousal/Alertness: Awake/alert Orientation Level: Oriented X4 Memory: Impaired Memory Impairment: Retrieval deficit;Storage deficit;Decreased short term memory Awareness: Appears intact Problem Solving: Appears intact Safety/Judgment: Appears intact  Comprehension Auditory Comprehension Overall Auditory Comprehension: Appears within functional limits for tasks assessed Expression Expression Primary Mode of Expression: Verbal Verbal Expression Overall Verbal Expression: Appears within functional limits for tasks assessed Oral Motor Oral Motor/Sensory Function Overall Oral Motor/Sensory Function: Within functional limits Motor Speech Overall Motor Speech: Appears within functional limits for tasks assessed  Care  Tool Care Tool Cognition Ability to hear (with hearing aid or hearing appliances if normally used Ability to hear (with hearing aid or hearing appliances if normally used): 0. Adequate - no difficulty in normal conservation, social interaction, listening to TV   Expression of Ideas and Wants  Expression of Ideas and Wants: 4. Without difficulty (complex and basic) - expresses complex messages without difficulty and with speech that is clear and easy to understand   Understanding Verbal and Non-Verbal Content Understanding Verbal and Non-Verbal Content: 3. Usually understands - understands most conversations, but misses some part/intent of message. Requires cues at times to understand  Memory/Recall Ability Memory/Recall Ability : Current season;That he or she is in a hospital/hospital unit   Short Term Goals: N/A   Refer to Care Plan for Long Term Goals  Recommendations for other services: None   Discharge Criteria: Patient will be discharged from SLP if patient refuses treatment 3 consecutive times without medical reason, if treatment goals not met, if there is a change in medical status, if patient makes no progress towards goals or if patient is discharged from hospital.  The above assessment, treatment plan, treatment alternatives and goals were discussed and mutually agreed upon: by patient and by family  Jahel Wavra 05/15/2023, 12:29 PM

## 2023-05-16 DIAGNOSIS — I10 Essential (primary) hypertension: Secondary | ICD-10-CM | POA: Diagnosis not present

## 2023-05-16 DIAGNOSIS — R339 Retention of urine, unspecified: Secondary | ICD-10-CM | POA: Diagnosis not present

## 2023-05-16 DIAGNOSIS — K5901 Slow transit constipation: Secondary | ICD-10-CM

## 2023-05-16 DIAGNOSIS — T1490XA Injury, unspecified, initial encounter: Secondary | ICD-10-CM | POA: Diagnosis not present

## 2023-05-16 LAB — CBC WITH DIFFERENTIAL/PLATELET
Abs Immature Granulocytes: 0.08 10*3/uL — ABNORMAL HIGH (ref 0.00–0.07)
Basophils Absolute: 0.1 10*3/uL (ref 0.0–0.1)
Basophils Relative: 1 %
Eosinophils Absolute: 0.4 10*3/uL (ref 0.0–0.5)
Eosinophils Relative: 4 %
HCT: 29.2 % — ABNORMAL LOW (ref 39.0–52.0)
Hemoglobin: 9.1 g/dL — ABNORMAL LOW (ref 13.0–17.0)
Immature Granulocytes: 1 %
Lymphocytes Relative: 17 %
Lymphs Abs: 1.8 10*3/uL (ref 0.7–4.0)
MCH: 30.8 pg (ref 26.0–34.0)
MCHC: 31.2 g/dL (ref 30.0–36.0)
MCV: 99 fL (ref 80.0–100.0)
Monocytes Absolute: 1 10*3/uL (ref 0.1–1.0)
Monocytes Relative: 10 %
Neutro Abs: 7 10*3/uL (ref 1.7–7.7)
Neutrophils Relative %: 67 %
Platelets: 472 10*3/uL — ABNORMAL HIGH (ref 150–400)
RBC: 2.95 MIL/uL — ABNORMAL LOW (ref 4.22–5.81)
RDW: 17.4 % — ABNORMAL HIGH (ref 11.5–15.5)
WBC: 10.4 10*3/uL (ref 4.0–10.5)
nRBC: 0 % (ref 0.0–0.2)

## 2023-05-16 LAB — BASIC METABOLIC PANEL
Anion gap: 8 (ref 5–15)
BUN: 18 mg/dL (ref 8–23)
CO2: 28 mmol/L (ref 22–32)
Calcium: 9.2 mg/dL (ref 8.9–10.3)
Chloride: 99 mmol/L (ref 98–111)
Creatinine, Ser: 0.82 mg/dL (ref 0.61–1.24)
GFR, Estimated: >60 mL/min (ref >60)
Glucose, Bld: 148 mg/dL — ABNORMAL HIGH (ref 70–99)
Potassium: 4.2 mmol/L (ref 3.5–5.1)
Sodium: 135 mmol/L (ref 135–145)

## 2023-05-16 MED ORDER — MELATONIN 5 MG PO TABS
5.0000 mg | ORAL_TABLET | Freq: Every day | ORAL | Status: DC
  Administered 2023-05-16 – 2023-05-26 (×11): 5 mg via ORAL
  Filled 2023-05-16 (×11): qty 1

## 2023-05-16 MED ORDER — OXYCODONE HCL 5 MG PO TABS
5.0000 mg | ORAL_TABLET | ORAL | Status: DC | PRN
  Administered 2023-05-16 – 2023-05-17 (×4): 5 mg via ORAL
  Filled 2023-05-16 (×3): qty 1

## 2023-05-16 NOTE — Progress Notes (Signed)
PROGRESS NOTE   Subjective/Complaints:  Pt doing well today, slept "fair" but feels like he could use some help with sleep-- increased melatonin for now.  Denies pain currently, LBM 3-4d ago per pt but documented as having BMs yesterday.  Urinating fine.  Denies any other complaints or concerns today.   ROS: Patient denies fever, rash, sore throat, blurred vision, dizziness, nausea, vomiting, diarrhea, cough, shortness of breath  headache, or mood change. Denies constipation today + Right pelvis area pain-improving  Objective:   No results found. Recent Labs    05/14/23 0605 05/16/23 0548  WBC 12.9* 10.4  HGB 8.9* 9.1*  HCT 28.9* 29.2*  PLT 579* 472*   Recent Labs    05/15/23 1541 05/16/23 0548  NA 132* 135  K 4.4 4.2  CL 99 99  CO2 24 28  GLUCOSE 135* 148*  BUN 21 18  CREATININE 0.84 0.82  CALCIUM 8.6* 9.2    Intake/Output Summary (Last 24 hours) at 05/16/2023 1115 Last data filed at 05/16/2023 0800 Gross per 24 hour  Intake 1080 ml  Output 950 ml  Net 130 ml        Physical Exam: Vital Signs Blood pressure (!) 142/58, pulse 67, temperature 97.6 F (36.4 C), temperature source Oral, resp. rate 18, height 5\' 10"  (1.778 m), weight 81.1 kg, SpO2 95%.   General: No apparent distress, laying in bed HEENT: MMM, EOMI Neck: Supple without JVD or lymphadenopathy Heart: Reg rate and rhythm. No m/r/g appreciated Chest: CTAB no wheezing appreciated this morning, no increased WOB Abdomen: Soft, non-tender, non-distended, bowel sounds positive. Extremities: No clubbing, cyanosis, or edema. Pulses are 2+ Psych: Pt's affect is appropriate. Pt is cooperative  PRIOR EXAMS: Musculoskeletal:     Comments: Wound VAC in place to R elbow laceration-no drainage thus far.   Prepubic and right lower quadrant incisions C/D/I skin place.  Moderate noted edema RLQ/flank.  No significant right thigh tenderness noted  today Minimal pain with right hip passive ROM Neurological:     Mental Status: He is oriented to person, place, and time. Follows commands. + memory deficits   Assessment/Plan: 1. Functional deficits which require 3+ hours per day of interdisciplinary therapy in a comprehensive inpatient rehab setting. Physiatrist is providing close team supervision and 24 hour management of active medical problems listed below. Physiatrist and rehab team continue to assess barriers to discharge/monitor patient progress toward functional and medical goals  Care Tool:  Bathing    Body parts bathed by patient: Right arm, Left arm, Chest, Abdomen, Face   Body parts bathed by helper: Front perineal area, Buttocks, Right upper leg, Left upper leg, Right lower leg, Left lower leg     Bathing assist Assist Level: Total Assistance - Patient < 25%     Upper Body Dressing/Undressing Upper body dressing   What is the patient wearing?: Hospital gown only    Upper body assist Assist Level: Moderate Assistance - Patient 50 - 74%    Lower Body Dressing/Undressing Lower body dressing      What is the patient wearing?: Hospital gown only, Incontinence brief     Lower body assist Assist for lower body dressing: Dependent -  Patient 0%     Editor, commissioning assist Assist for toileting: Dependent - Patient 0%     Transfers Chair/bed transfer  Transfers assist     Chair/bed transfer assist level: Maximal Assistance - Patient 25 - 49%     Locomotion Ambulation   Ambulation assist   Ambulation activity did not occur: Safety/medical concerns          Walk 10 feet activity   Assist  Walk 10 feet activity did not occur: Safety/medical concerns        Walk 50 feet activity   Assist Walk 50 feet with 2 turns activity did not occur: Safety/medical concerns         Walk 150 feet activity   Assist Walk 150 feet activity did not occur: Safety/medical concerns          Walk 10 feet on uneven surface  activity   Assist Walk 10 feet on uneven surfaces activity did not occur: Safety/medical concerns         Wheelchair     Assist Is the patient using a wheelchair?: Yes Type of Wheelchair: Manual    Wheelchair assist level: Minimal Assistance - Patient > 75% Max wheelchair distance: 175'    Wheelchair 50 feet with 2 turns activity    Assist        Assist Level: Minimal Assistance - Patient > 75%   Wheelchair 150 feet activity     Assist      Assist Level: Minimal Assistance - Patient > 75%   Blood pressure (!) 142/58, pulse 67, temperature 97.6 F (36.4 C), temperature source Oral, resp. rate 18, height 5\' 10"  (1.778 m), weight 81.1 kg, SpO2 95%.  Medical Problem List and Plan: 1. Functional deficits secondary to polytrauma             -patient may shower             -ELOS/Goals: 14-20 days S with PT/OT             -Continue CIR -SLP consult for cognition-felt to be at baseline, slums score of 21 out of 30 2.  Antithrombotics: -DVT/anticoagulation:  Pharmaceutical: Lovenox 30mg  BID, Eliquis 2.5 mg twice daily for 30 days on discharge for DVT prophylaxis             -antiplatelet therapy: N/AA 3. Pain Management: continue tylenol 650mg  q6h, robaxin 1000mg  q8h, Oxycodone as needed severe pain -10/25 Will schedule oxycodone 5 mg at 7 AM, gabapentin 100 mg 3 times daily started 4. Mood/Behavior/Sleep: LCSW to follow for evaluation and support             -antipsychotic agents: Seroquel 50mg  as needed  -Lorazepam 0.5mg  QHS -05/16/23 sleeping fair but wants to try increased melatonin dose, inc to 5mg  at bedtime, monitor sleep for now 5. Neuropsych/cognition: This patient is  capable of making decisions on his own behalf. 6. Skin/Wound Care: Routine pressure-relief measures.   --Monitor right elbow for drainage/healing. 7. Fluids/Electrolytes/Nutrition: Monitor I/os.  Continue Ensure supplements.             -- Add  vitamin C/D3, MVI, zinc and Juven. 8. Pelvic ring fracture s/p percutaneous fixation: Strict NWB RLE and WBAT LLE for transfers only. 9. Right elbow wound drainage: Now on doxycycline.  Reports night sweats.  Cultures sent -Discussed with Dr. Jena Gauss yesterday who recommended Doxycycline 100 mg twice daily, his team plans to follow-up and check on him and CIR.   -10/25  wound VAC started by Ortho appreciate assistance, continue current antibiotics, will recheck labs tomorrow -05/16/23 BCx and elbow Cx NGTD x3 days, WBC down to 10.4 today; monitor 10. Acute blood loss anemia: Monitor for any signs of bleeding.  Hgb stable 9.1 on 05/16/23 11. Leukocytosis: White count trending down from 23.6-->12.9>> 10.1 10/26; monitor routinely 12.  Thrombocytosis: Likely reactive.  Continue to monitor. 472 on 05/16/23 13. Low protein stores: Albumin down to 2.2--supplements added. 14. H/o NICM/HF s/p ICD:  Daily weights/monitor for signs of overload. Change diet to heart healthy.  No signs of fluid overload -- On Coreg 25mg  BID, aldactone 25mg  QD, Lasix 20mg  BID, and Pravachol 80mg  daily, Zetia 10mg  daily             --decrease Kdur to 20 meq bid as K trending up.  --Repeat chest x-ray in a.m.- no acute findings -05/16/23 wt stable, BPs stable, monitor Filed Weights   05/14/23 0652 05/15/23 0537 05/16/23 0525  Weight: 81.7 kg 81.2 kg 81.1 kg   Vitals:   05/14/23 0315 05/14/23 1157 05/14/23 1428 05/14/23 2020  BP: (!) 150/67 (!) 124/52 137/64 126/63   05/15/23 0537 05/15/23 0801 05/15/23 1249 05/15/23 2001  BP: (!) 141/68 129/62 137/60 (!) 114/45   05/16/23 0409  BP: (!) 142/58     15. Multiple rib fractures: Encourage pulmonary hygiene. Incentive spirometer ordered   16. Urinary retention: Monitor voiding with PVR checks.  continue Flomax 0.8mg  daily.  -10/24 Required IC this AM, continue to monitor  PVRs  -Timed voids every 4 hours, consider UA if urinary symptoms worsen -05/16/23 PVRs decreasing,  hasn't needed cath since yesterday; monitor  17. Constipation. Appears to be having regular Bms.   -Cont miralax prn  -05/16/23 last BM yesterday, improved, cont regimen  LOS: 3 days A FACE TO FACE EVALUATION WAS PERFORMED  [address] 05/16/2023, 11:15 AM

## 2023-05-16 NOTE — Progress Notes (Addendum)
Physical Therapy Session Note  Patient Details  Name: Louis Juarez. MRN: [id number] Date of Birth: [birth year]  Today's Date: 05/16/2023 PT Individual Time: 1110-1206; 1358 - 1445 PT Individual Time Calculation (min): 56 min; 47 min Today's Date: 05/16/2023 PT Missed Time: 28 Minutes Missed Time Reason: Pain  Short Term Goals: Week 1:  PT Short Term Goal 1 (Week 1): Pt will complete bed mobility with modA. PT Short Term Goal 2 (Week 1): Pt will complete bed to chair transfer with modA. PT Short Term Goal 3 (Week 1): Pt will accurately recall and maintain WB precautions.  SESSION 1 Skilled Therapeutic Interventions/Progress Updates: Patient supine in bed on entrance to room. Patient alert and agreeable to PT session.   Patient reported no much pain since pt was not moving at that time, but pain increases (unrated) when pt moves in bed. OT communicated that pt had increased pain during session that made it difficult to get OOB. PTA coordinated with nsg that pt received pain medication shortly after OT session, and that pt was not due for pain meds until after lunch. Today's session primary focus was helping pt with pain distraction through breathing due to therex in bed of SLR of L LE with pt straining (holding breath) and reports of increased pain on R side of ribs. Pt cued to count during reps to promote breathing, but pt still straining and inhaling on concentric, and exhaling on eccentric and quickly resorting back to breathing through mouth solely. Pt reports breathing through mouth for years prior. PTA educated pt on breathing technique through nose and out of mouth, and further emphasized importance when it comes to muscular function/activation. Pt understood rationale. PTA guided pt through synchronized active demonstration with pt for sequence (inhale through nose, exhale through mouth). Progressed to inhale with L LE at rest, and exhale with R LE in contraction. Pt with max cues and  demonstration and PTA providing AAROM on L LE during SLR at the right time for pt to get sequence. Pt required increased time and effort to perform, but was able to do so with following breathing sequence with PTA at same time. Pt then with heel slides with plastic bag tied around L ankle to decrease friction. Pt progressed to yellow theraband resistance and performed several reps until needing to rest (3 rounds). Pt educated on L LE strengthening while in bed to prime for standing tolerance since pt has NWB status on R LE. Pt understood. Pt also reported pain in R metatarsal of 5th digit in foot (PTA pressed and pt reported sharp pain that did not radiate, but stayed localized to area - RN notified - no bruising or discoloration noted).   Patient supine in bed at end of session with brakes locked, bed alarm set, and all needs within reach.  SESSION 2 Skilled Therapeutic Interventions/Progress Updates: Patient long sitting in bed on entrance to room. Patient alert and agreeable to PT session.   Pt reporting "feeling okay" at beginning of PT session and stated lack of pain due to not moving. PTA informed that pt received pain medication prior to therapy, which pt forgot. Pt informed of this PT session's primary goal, which is to get pt to tolerate sitting edge of bed and standing on L LE while maintaining NWB precautions on R LE. Pt understood and agreeable to do as much as able. Pt performed supine to L sidelying (transfer to L of bed this trial due to pt's wound vac on R  elbow, and needing R elbow to assist with ascending to EOB, and controlling back to sidelying). Pt performed x 2 with CGA. Pt L sidelying to sit with 2 attempts required. 1st attempt with pt performing sequence of UE to assist, and PTA managing B LE advancement to slightly off EOB. Pt required maxA and immediately reported increase in pain in "rectum" - pt stated. Pt back to L sidelying with maxA, then re-attempted back to sit with heavy  maxA for pt safety as the primary focus of this session is not bed mobility, but upright tolerance as stated earlier. Pt sat EOB with B UE (if without, pt cannot maintain sitting position). Pt sat EOB for 18 min with pain remaining around 7/10 (movement increased pain). + 2 arrived and performed +2 sit to stand with PTA on R side, rehab tech on L and pt's B UE's around shoulders. Pt cued to perform breathing as practiced earlier to increase energy exertion and musculature activation, as well as distraction from pain (breath in nose and out with mouth on each count until 3, then exhale on 3 during power up). Pt stood maxA +2 and remained standing for roughly 10 seconds with min cues to avoid standing on R LE (PTA provided demo of extending R LE out). Pt sat back down and reported increase pain on rectum again and stated wanting to lay back down. Pt agreeable to stand one more time to laterally shift to [facility]. Pt stood + 2 and maxA and laterally shuffled to [facility] with increased time required and sat with maxA for safety to control descent (VC to reach back with L UE). Pt then maxA +2 to get back to supine from EOB for safety and pt reports of pain. Pt then scooted to [facility] with B UE pulling on HOB railing and +2 chuck assist. Pt reporting to be done for the day due to pain, but felt happy for how today's session went to at least get sitting EOB and standing for a few moments. Pt understood needing to make up missed minutes.   Patient supine in bed at end of session with brakes locked, bed alarm set, and all needs within reach.       Therapy Documentation Precautions:  Precautions Precautions: Fall Restrictions Weight Bearing Restrictions: Yes RUE Weight Bearing: Weight bearing as tolerated RLE Weight Bearing: Non weight bearing LLE Weight Bearing: Weight bearing as tolerated Other Position/Activity Restrictions: LLE WBAT for transfers only  Therapy/Group: Individual Therapy  Shequita Peplinski  PTA 05/16/2023, 12:16 PM

## 2023-05-16 NOTE — IPOC Note (Signed)
Overall Plan of Care [facility]) Patient Details Name: Louis Juarez. MRN: [id number] DOB: July 18, 1941  Admitting Diagnosis: Trauma  Hospital Problems: Principal Problem:   Trauma     Functional Problem List: Nursing Behavior, Bladder, Bowel, Endurance, Medication Management, Motor, Pain, Safety, Sensory, Skin Integrity  PT Balance, Behavior, Endurance, Motor, Pain, Safety  OT Balance, Edema, Safety, Endurance, Pain, Cognition, Motor, Skin Integrity  SLP    TR         Basic ADL's: OT Grooming, Bathing, Dressing, Toileting     Advanced  ADL's: OT Simple Meal Preparation, Light Housekeeping, Laundry     Transfers: PT Bed Mobility, Bed to Chair, Set designer, Occupational psychologist, Research scientist (life sciences): PT Wheelchair Mobility     Additional Impairments: OT Fuctional Use of Upper Extremity  SLP None      TR      Anticipated Outcomes Item Anticipated Outcome  Self Feeding indep  Swallowing      Basic self-care  min A  Toileting  min A   Bathroom Transfers min a  Bowel/Bladder  regain continence of bowel/bladder  Transfers  MinA  Locomotion  Supervision WC Level  Communication     Cognition     Pain  less than 4  Safety/Judgment  no falls   Therapy Plan: PT Intensity: Minimum of 1-2 x/day ,45 to 90 minutes PT Frequency: 5 out of 7 days PT Duration Estimated Length of Stay: 2 weeks OT Intensity: Minimum of 1-2 x/day, 45 to 90 minutes OT Frequency: 5 out of 7 days OT Duration/Estimated Length of Stay: 2 weeks SLP Duration/Estimated Length of Stay: N/A   Team Interventions: Nursing Interventions Patient/Family Education, Bladder Management, Bowel Management, Disease Management/Prevention, Pain Management, Medication Management, Skin Care/Wound Management, Cognitive Remediation/Compensation, Discharge Planning, Psychosocial Support  PT interventions Community reintegration, DME/adaptive equipment instruction, Neuromuscular re-education, Psychosocial  support, UE/LE Strength taining/ROM, Stair training, Wheelchair propulsion/positioning, Warden/ranger, Discharge planning, Functional electrical stimulation, Pain management, Skin care/wound management, Therapeutic Activities, UE/LE Coordination activities, Cognitive remediation/compensation, Functional mobility training, Patient/family education, Therapeutic Exercise, Splinting/orthotics  OT Interventions Warden/ranger, Cognitive remediation/compensation, Community reintegration, Discharge planning, Disease mangement/prevention, DME/adaptive equipment instruction, Functional electrical stimulation, Functional mobility training, Neuromuscular re-education, Pain management, Patient/family education, Psychosocial support, Self Care/advanced ADL retraining, Skin care/wound managment, Splinting/orthotics, Therapeutic Activities, Therapeutic Exercise, UE/LE Strength taining/ROM, UE/LE Coordination activities, Visual/perceptual remediation/compensation, Wheelchair propulsion/positioning  SLP Interventions    TR Interventions    SW/CM Interventions Discharge Planning, Psychosocial Support, Patient/Family Education   Barriers to Discharge MD  Medical stability  Nursing Decreased caregiver support, Home environment access/layout, Incontinence, Wound Care, Lack of/limited family support, Weight bearing restrictions, Behavior home with spouse B/B main level with 8 ste entry with BHR  PT Inaccessible home environment, Home environment access/layout    OT Inaccessible home environment, Decreased caregiver support, Home environment access/layout, Weight bearing restrictions WB status, STE, wife works and w/c level needs  SLP      SW [employer] bearing restrictions     Team Discharge Planning: Destination: PT-Home ,OT- Home , SLP-Home Projected Follow-up: PT-Home health PT, 24 hour supervision/assistance, OT-  Home health OT, SLP-24 hour supervision/assistance Projected Equipment  Needs: PT-To be determined, OT- 3 in 1 bedside comode, Sliding board, Tub/shower bench, Wheelchair (measurements), Wheelchair cushion (measurements), SLP-None recommended by SLP Equipment Details: PT- , OT-:w/c cushion, TB, ramp, hip kit, DABSC, TTB, HHOT Patient/family involved in discharge planning: PT- Patient,  OT-Patient, SLP-Patient, Family member/caregiver  MD ELOS: 14-20 days S Medical Rehab Prognosis:  Excellent Assessment: The patient has been admitted for CIR therapies with the diagnosis of polytrauma. The team will be addressing functional mobility, strength, stamina, balance, safety, adaptive techniques and equipment, self-care, bowel and bladder mgt, patient and caregiver education. Goals have been set at supervision. Anticipated discharge destination is home.        See Team Conference Notes for weekly updates to the plan of care

## 2023-05-16 NOTE — Plan of Care (Signed)
  Problem: Consults Goal: RH GENERAL PATIENT EDUCATION Description: See Patient Education module for education specifics. Outcome: Progressing Goal: Skin Care Protocol Initiated - if Braden Score 18 or less Description: If consults are not indicated, leave blank or document N/A Outcome: Progressing   Problem: RH BOWEL ELIMINATION Goal: RH STG MANAGE BOWEL WITH ASSISTANCE Description: STG Manage Bowel with min Assistance. Outcome: Progressing Goal: RH STG MANAGE BOWEL W/MEDICATION W/ASSISTANCE Description: STG Manage Bowel with Medication with min Assistance. Outcome: Progressing   Problem: RH BLADDER ELIMINATION Goal: RH STG MANAGE BLADDER WITH ASSISTANCE Description: STG Manage Bladder With min Assistance Outcome: Progressing Goal: RH STG MANAGE BLADDER WITH MEDICATION WITH ASSISTANCE Description: STG Manage Bladder With Medication With min Assistance. Outcome: Progressing   Problem: RH SKIN INTEGRITY Goal: RH STG SKIN FREE OF INFECTION/BREAKDOWN Description: Incisions will continue to heal and be free of infection/breakdown with min assist  Outcome: Progressing Goal: RH STG MAINTAIN SKIN INTEGRITY WITH ASSISTANCE Description: STG Maintain Skin Integrity With min Assistance. Outcome: Progressing Goal: RH STG ABLE TO PERFORM INCISION/WOUND CARE W/ASSISTANCE Description: STG Able To Perform Incision/Wound Care With min Assistance. Outcome: Progressing   Problem: RH SAFETY Goal: RH STG ADHERE TO SAFETY PRECAUTIONS W/ASSISTANCE/DEVICE Description: STG Adhere to Safety Precautions With cueing Assistance/Device. Outcome: Progressing Goal: RH STG DECREASED RISK OF FALL WITH ASSISTANCE Description: STG Decreased Risk of Fall With min Assistance. Outcome: Progressing   Problem: RH PAIN MANAGEMENT Goal: RH STG PAIN MANAGED AT OR BELOW PT'S PAIN GOAL Description: Less than 4 with PRN medications min assist  Outcome: Progressing   Problem: RH KNOWLEDGE DEFICIT GENERAL Goal: RH  STG INCREASE KNOWLEDGE OF SELF CARE AFTER HOSPITALIZATION Description: Patient/caregiver will be able to manage medications, self care, weight bearing precautions, and diet/lifestyle modifications to improve health from nursing education, nursing handouts, and other resources independently  Outcome: Progressing   Problem: Consults Goal: RH GENERAL PATIENT EDUCATION Description: See Patient Education module for education specifics. Outcome: Progressing   Problem: Consults Goal: Skin Care Protocol Initiated - if Braden Score 18 or less Description: If consults are not indicated, leave blank or document N/A Outcome: Progressing   Problem: RH BOWEL ELIMINATION Goal: RH STG MANAGE BOWEL W/MEDICATION W/ASSISTANCE Description: STG Manage Bowel with Medication with min Assistance. Outcome: Progressing   Problem: RH BLADDER ELIMINATION Goal: RH STG MANAGE BLADDER WITH ASSISTANCE Description: STG Manage Bladder With min Assistance Outcome: Progressing   Problem: RH BLADDER ELIMINATION Goal: RH STG MANAGE BLADDER WITH MEDICATION WITH ASSISTANCE Description: STG Manage Bladder With Medication With min Assistance. Outcome: Progressing

## 2023-05-16 NOTE — Progress Notes (Signed)
Occupational Therapy Session Note  Patient Details  Name: Louis Juarez. MRN: [id number] Date of Birth: February 01, 1941  Today's Date: 05/16/2023 OT Individual Time: 6283-1517 OT Individual Time Calculation (min): 55 min    Short Term Goals: Week 1:  OT Short Term Goal 1 (Week 1): Pt will transfer with TB to [facility] with mod A to and from L side OT Short Term Goal 2 (Week 1): Pt will complete LB dressing with AE at EOB with mod a OT Short Term Goal 3 (Week 1): Pt will complete grooming sinkside seated with S w/c level  Skilled Therapeutic Interventions/Progress Updates:  Pt received resting in bed for skilled OT session with focus on BADL retraining at bed-level. Pt agreeable to interventions, demonstrating overall pleasant mood. Pt reported 8/10 pain during bed mobility, otherwise 4-5/10, medicated during session. OT offering intermediate rest breaks and positioning suggestions throughout session to address pain/fatigue and maximize participation/safety in session.   Pt's wound vac unplugged/off upon OT assessment, RN updated, turned back on and working during session.   Pt completes full-body sponge-bathing and dressing with levels of assistance noted below: UB: Setup/supervision, encouragement provided for thoroughness of bathing, setup-Min A for applying deodorant and donning t-shirt with management of wound-vac.  LB: Mod A for LB bathing including posterior peri-care and B lower legs, pt able to clean anterior peri-area and B upper legs. Pt dependent for threading shorts at bed-level, rolling R/L with Min A for RLE management to dependently hike shorts over bottom/hips.  Pt dependent for boosting in bed, x2 for safety. Pt able to recall basic tasks completed during session. This OT speaks with spouse, per her request, spouse asks about "cognitive assessment" to be administered. Education provided on SLP evaluation already having been conducted, but will relay request to treatment team.    Pt remained resting in bed with all immediate needs met at end of session. Pt continues to be appropriate for skilled OT intervention to promote further functional independence.   Therapy Documentation Precautions:  Precautions Precautions: Fall Restrictions Weight Bearing Restrictions: Yes RUE Weight Bearing: Weight bearing as tolerated RLE Weight Bearing: Non weight bearing LLE Weight Bearing: Weight bearing as tolerated Other Position/Activity Restrictions: LLE WBAT for transfers only   Therapy/Group: Individual Therapy  [name], OTR/L, MSOT  05/16/2023, 5:23 AM

## 2023-05-17 DIAGNOSIS — R195 Other fecal abnormalities: Secondary | ICD-10-CM | POA: Diagnosis not present

## 2023-05-17 DIAGNOSIS — T1490XA Injury, unspecified, initial encounter: Secondary | ICD-10-CM | POA: Diagnosis not present

## 2023-05-17 DIAGNOSIS — R339 Retention of urine, unspecified: Secondary | ICD-10-CM | POA: Diagnosis not present

## 2023-05-17 DIAGNOSIS — I10 Essential (primary) hypertension: Secondary | ICD-10-CM | POA: Diagnosis not present

## 2023-05-17 MED ORDER — OXYCODONE HCL 5 MG PO TABS
5.0000 mg | ORAL_TABLET | ORAL | Status: DC | PRN
  Administered 2023-05-17 – 2023-05-18 (×3): 10 mg via ORAL
  Administered 2023-05-18: 5 mg via ORAL
  Administered 2023-05-18 – 2023-05-22 (×7): 10 mg via ORAL
  Administered 2023-05-22: 5 mg via ORAL
  Administered 2023-05-23: 10 mg via ORAL
  Administered 2023-05-24: 5 mg via ORAL
  Administered 2023-05-24 – 2023-05-31 (×11): 10 mg via ORAL
  Filled 2023-05-17 (×20): qty 2
  Filled 2023-05-17: qty 1
  Filled 2023-05-17 (×5): qty 2

## 2023-05-17 MED ORDER — DM-GUAIFENESIN ER 30-600 MG PO TB12
2.0000 | ORAL_TABLET | Freq: Two times a day (BID) | ORAL | Status: DC
  Administered 2023-05-17 – 2023-06-01 (×29): 2 via ORAL
  Filled 2023-05-17 (×2): qty 2
  Filled 2023-05-17 (×2): qty 1
  Filled 2023-05-17: qty 2
  Filled 2023-05-17: qty 1
  Filled 2023-05-17 (×4): qty 2
  Filled 2023-05-17: qty 1
  Filled 2023-05-17: qty 2
  Filled 2023-05-17: qty 1
  Filled 2023-05-17: qty 2
  Filled 2023-05-17: qty 1
  Filled 2023-05-17 (×4): qty 2
  Filled 2023-05-17: qty 1
  Filled 2023-05-17 (×9): qty 2
  Filled 2023-05-17 (×2): qty 1

## 2023-05-17 NOTE — Progress Notes (Addendum)
Upon arrival to room patient request I speak to his sister (speaker phone) in regards to his care. Sister stated she was NP. I informed his sister that she would need to follow up with patient's wife in regards to his care. This nurse is not at liberty to give out any of this patient's information due to HIPAA law. Reinforced education to the patient on policies, mobility activity, toileting/bladder program, pain management and non pharmacological interventions, rationale for therapy and informed him this nurse is advocating for his care while on Rehab. Explained to the patient how important it is to follow the safety plan and the benefits of getting out bed. Patient stated he can be forgetful at times. This nurse has frequently rounded on this patient, reposition throughout the day q2, offered to get patient out of bed on multiple occasions. Patient states pain continues, reinforced education on pain management. Notified Dr. Carlis Abbott of all conversations  with this patient. New orders placed. Charge RN aware.    Tilden Dome, LPN

## 2023-05-17 NOTE — Progress Notes (Signed)
Occupational Therapy Session Note  Patient Details  Name: Louis Juarez. MRN: [id number] Date of Birth: 1941-07-19  {CHL IP REHAB OT TIME CALCULATIONS:[id number]}   Short Term Goals: Week 1:  OT Short Term Goal 1 (Week 1): Pt will transfer with TB to [facility] with mod A to and from L side OT Short Term Goal 2 (Week 1): Pt will complete LB dressing with AE at EOB with mod a OT Short Term Goal 3 (Week 1): Pt will complete grooming sinkside seated with S w/c level  Skilled Therapeutic Interventions/Progress Updates:  Pt received *** for skilled OT session with focus on ***. Pt agreeable to interventions, demonstrating overall *** mood. Pt reported ***/10 pain, stating "***" in reference to ***. OT offering intermediate rest breaks and positioning suggestions throughout session to address pain/fatigue and maximize participation/safety in session.    Pt remained *** with all immediate needs met at end of session. Pt continues to be appropriate for skilled OT intervention to promote further functional independence.   Therapy Documentation Precautions:  Precautions Precautions: Fall Restrictions Weight Bearing Restrictions: Yes RUE Weight Bearing: Weight bearing as tolerated RLE Weight Bearing: Non weight bearing LLE Weight Bearing: Weight bearing as tolerated Other Position/Activity Restrictions: LLE WBAT for transfers only   Therapy/Group: Individual Therapy  [name], OTR/L, MSOT  05/17/2023, 3:02 PM

## 2023-05-17 NOTE — Plan of Care (Signed)
  Problem: RH KNOWLEDGE DEFICIT GENERAL Goal: RH STG INCREASE KNOWLEDGE OF SELF CARE AFTER HOSPITALIZATION Description: Patient/caregiver will be able to manage medications, self care, weight bearing precautions, and diet/lifestyle modifications to improve health from nursing education, nursing handouts, and other resources independently  Outcome: Progressing   Problem: RH PAIN MANAGEMENT Goal: RH STG PAIN MANAGED AT OR BELOW PT'S PAIN GOAL Description: Less than 4 with PRN medications min assist  Outcome: Progressing   Problem: RH SAFETY Goal: RH STG DECREASED RISK OF FALL WITH ASSISTANCE Description: STG Decreased Risk of Fall With min Assistance. Outcome: Progressing   Problem: RH SAFETY Goal: RH STG ADHERE TO SAFETY PRECAUTIONS W/ASSISTANCE/DEVICE Description: STG Adhere to Safety Precautions With cueing Assistance/Device. Outcome: Progressing   Problem: RH SKIN INTEGRITY Goal: RH STG ABLE TO PERFORM INCISION/WOUND CARE W/ASSISTANCE Description: STG Able To Perform Incision/Wound Care With min Assistance. Outcome: Progressing   Problem: RH SKIN INTEGRITY Goal: RH STG MAINTAIN SKIN INTEGRITY WITH ASSISTANCE Description: STG Maintain Skin Integrity With min Assistance. Outcome: Progressing   Problem: RH BLADDER ELIMINATION Goal: RH STG MANAGE BLADDER WITH ASSISTANCE Description: STG Manage Bladder With min Assistance Outcome: Progressing   Problem: RH BOWEL ELIMINATION Goal: RH STG MANAGE BOWEL W/MEDICATION W/ASSISTANCE Description: STG Manage Bowel with Medication with min Assistance. Outcome: Progressing   Problem: RH BOWEL ELIMINATION Goal: RH STG MANAGE BOWEL WITH ASSISTANCE Description: STG Manage Bowel with min Assistance. Outcome: Progressing   Problem: RH BLADDER ELIMINATION Goal: RH STG MANAGE BLADDER WITH MEDICATION WITH ASSISTANCE Description: STG Manage Bladder With Medication With min Assistance. Outcome: Progressing   Problem: RH BLADDER  ELIMINATION Goal: RH STG MANAGE BLADDER WITH ASSISTANCE Description: STG Manage Bladder With min Assistance Outcome: Progressing

## 2023-05-17 NOTE — Progress Notes (Signed)
Orthopedic Tech Progress Note Patient Details:  Louis Juarez 08-Nov-1940 [id number]  Ortho Devices Type of Ortho Device: Prafo boot/shoe Ortho Device/Splint Location: RLE Ortho Device/Splint Interventions: Ordered, Application, Adjustment   Post Interventions Patient Tolerated: Well Instructions Provided: Care of device, Adjustment of device  Louis Juarez [name] 05/17/2023, 5:22 PM

## 2023-05-17 NOTE — Progress Notes (Addendum)
Notified PA that this patient refused to get up today .Marland KitchenMarland Kitchenpatient repositioned q2 and dangled patient on the side of the bed. Per this patient he doesn't want to get out of bed fully today due to the pain ..pain is being managed in a timely manner and non -pharmacological interventions have been implemented as well educated patient on pain management and decreased relief. Wife educated as well. Wife and patient verbalized understanding. Dr Carlis Abbott notified and [address] [place] Notified. Also, notified Charge, RN    Tilden Dome, LPN

## 2023-05-17 NOTE — Progress Notes (Signed)
PROGRESS NOTE   Subjective/Complaints:  Pt doing well again today, slept much better last night! Very appreciative of the increased melatonin.  Denies pain currently, LBM this morning and was a little loose.  Urinating fine per pt but appears he got cathed twice yesterday.  Denies any other complaints or concerns today.   ROS: Patient denies fever, rash, sore throat, blurred vision, dizziness, nausea, vomiting, diarrhea, cough, shortness of breath  headache, or mood change. Denies constipation today + Right pelvis area pain-improving  Objective:   No results found. Recent Labs    05/16/23 0548  WBC 10.4  HGB 9.1*  HCT 29.2*  PLT 472*   Recent Labs    05/15/23 1541 05/16/23 0548  NA 132* 135  K 4.4 4.2  CL 99 99  CO2 24 28  GLUCOSE 135* 148*  BUN 21 18  CREATININE 0.84 0.82  CALCIUM 8.6* 9.2    Intake/Output Summary (Last 24 hours) at 05/17/2023 1051 Last data filed at 05/17/2023 0748 Gross per 24 hour  Intake 177 ml  Output 2600 ml  Net -2423 ml        Physical Exam: Vital Signs Blood pressure 131/70, pulse 73, temperature 97.6 F (36.4 C), temperature source Oral, resp. rate 18, height 5\' 10"  (1.778 m), weight 79.5 kg, SpO2 93%.   General: No apparent distress, laying in bed comfortably HEENT: MMM, EOMI Neck: Supple without JVD or lymphadenopathy Heart: Reg rate and rhythm. No m/r/g appreciated Chest: slight expiratory congestion/wheeze noted but seems to clear with coughing, no other w/r/r, no increased WOB Abdomen: Soft, non-tender, non-distended, bowel sounds positive. Extremities: No clubbing, cyanosis, or edema. Pulses are 2+ Psych: Pt's affect is appropriate. Pt is cooperative  PRIOR EXAMS: Musculoskeletal:     Comments: Wound VAC in place to R elbow laceration-no drainage thus far.   Prepubic and right lower quadrant incisions C/D/I skin place.  Moderate noted edema RLQ/flank.  No  significant right thigh tenderness noted today Minimal pain with right hip passive ROM Neurological:     Mental Status: He is oriented to person, place, and time. Follows commands. + memory deficits   Assessment/Plan: 1. Functional deficits which require 3+ hours per day of interdisciplinary therapy in a comprehensive inpatient rehab setting. Physiatrist is providing close team supervision and 24 hour management of active medical problems listed below. Physiatrist and rehab team continue to assess barriers to discharge/monitor patient progress toward functional and medical goals  Care Tool:  Bathing    Body parts bathed by patient: Right arm, Left arm, Chest, Abdomen, Face   Body parts bathed by helper: Front perineal area, Buttocks, Right upper leg, Left upper leg, Right lower leg, Left lower leg     Bathing assist Assist Level: Total Assistance - Patient < 25%     Upper Body Dressing/Undressing Upper body dressing   What is the patient wearing?: Hospital gown only    Upper body assist Assist Level: Moderate Assistance - Patient 50 - 74%    Lower Body Dressing/Undressing Lower body dressing      What is the patient wearing?: Hospital gown only, Incontinence brief     Lower body assist Assist for lower  body dressing: Dependent - Patient 0%     Toileting Toileting    Toileting assist Assist for toileting: Dependent - Patient 0%     Transfers Chair/bed transfer  Transfers assist     Chair/bed transfer assist level: Maximal Assistance - Patient 25 - 49%     Locomotion Ambulation   Ambulation assist   Ambulation activity did not occur: Safety/medical concerns          Walk 10 feet activity   Assist  Walk 10 feet activity did not occur: Safety/medical concerns        Walk 50 feet activity   Assist Walk 50 feet with 2 turns activity did not occur: Safety/medical concerns         Walk 150 feet activity   Assist Walk 150 feet activity did  not occur: Safety/medical concerns         Walk 10 feet on uneven surface  activity   Assist Walk 10 feet on uneven surfaces activity did not occur: Safety/medical concerns         Wheelchair     Assist Is the patient using a wheelchair?: Yes Type of Wheelchair: Manual    Wheelchair assist level: Minimal Assistance - Patient > 75% Max wheelchair distance: 175'    Wheelchair 50 feet with 2 turns activity    Assist        Assist Level: Minimal Assistance - Patient > 75%   Wheelchair 150 feet activity     Assist      Assist Level: Minimal Assistance - Patient > 75%   Blood pressure 131/70, pulse 73, temperature 97.6 F (36.4 C), temperature source Oral, resp. rate 18, height 5\' 10"  (1.778 m), weight 79.5 kg, SpO2 93%.  Medical Problem List and Plan: 1. Functional deficits secondary to polytrauma             -patient may shower             -ELOS/Goals: 14-20 days S with PT/OT             -Continue CIR -SLP consult for cognition-felt to be at baseline, slums score of 21 out of 30 2.  Antithrombotics: -DVT/anticoagulation:  Pharmaceutical: Lovenox 30mg  BID, Eliquis 2.5 mg twice daily for 30 days on discharge for DVT prophylaxis             -antiplatelet therapy: N/AA 3. Pain Management: continue tylenol 650mg  q6h, robaxin 1000mg  q8h, Oxycodone as needed severe pain -10/25 Will schedule oxycodone 5 mg at 7 AM, gabapentin 100 mg 3 times daily started 4. Mood/Behavior/Sleep: LCSW to follow for evaluation and support             -antipsychotic agents: Seroquel 50mg  as needed  -Lorazepam 0.5mg  QHS -05/16/23 sleeping fair but wants to try increased melatonin dose, inc to 5mg  at bedtime, monitor sleep for now-- improved 05/17/23! 5. Neuropsych/cognition: This patient is  capable of making decisions on his own behalf. 6. Skin/Wound Care: Routine pressure-relief measures.   --Monitor right elbow for drainage/healing. 7. Fluids/Electrolytes/Nutrition: Monitor  I/os.  Continue Ensure supplements.             -- Add vitamin C/D3, MVI, zinc and Juven. 8. Pelvic ring fracture s/p percutaneous fixation: Strict NWB RLE and WBAT LLE for transfers only. 9. Right elbow wound drainage: Now on doxycycline.  Reports night sweats.  Cultures sent -Discussed with Dr. Jena Gauss yesterday who recommended Doxycycline 100 mg twice daily, his team plans to follow-up and check on him  and CIR.   -10/25 wound VAC started by Ortho appreciate assistance, continue current antibiotics, will recheck labs tomorrow -05/16/23 BCx and elbow Cx NGTD x3 days, WBC down to 10.4 today; monitor-- still NGTD 05/17/23 10. Acute blood loss anemia: Monitor for any signs of bleeding.  Hgb stable 9.1 on 05/16/23 11. Leukocytosis: White count trending down from 23.6-->12.9>> 10.1 10/26; monitor routinely 12.  Thrombocytosis: Likely reactive.  Continue to monitor. 472 on 05/16/23 13. Low protein stores: Albumin down to 2.2--supplements added. 14. H/o NICM/HF s/p ICD:  Daily weights/monitor for signs of overload. Change diet to heart healthy.  No signs of fluid overload -- On Coreg 25mg  BID, aldactone 25mg  QD, Lasix 20mg  BID, and Pravachol 80mg  daily, Zetia 10mg  daily             --decrease Kdur to 20 meq bid as K trending up.  --Repeat chest x-ray in a.m.- no acute findings -05/16/23 wt stable, BPs stable, monitor-- wt down a little 05/17/23, monitor, BP stable Filed Weights   05/15/23 0537 05/16/23 0525 05/17/23 0519  Weight: 81.2 kg 81.1 kg 79.5 kg   Vitals:   05/14/23 0315 05/14/23 1157 05/14/23 1428 05/14/23 2020  BP: (!) 150/67 (!) 124/52 137/64 126/63   05/15/23 0537 05/15/23 0801 05/15/23 1249 05/15/23 2001  BP: (!) 141/68 129/62 137/60 (!) 114/45   05/16/23 0409 05/16/23 1506 05/16/23 1952 05/17/23 0258  BP: (!) 142/58 (!) 127/50 (!) 135/58 131/70     15. Multiple rib fractures: Encourage pulmonary hygiene. Incentive spirometer ordered   16. Urinary retention: Monitor voiding with  PVR checks.  continue Flomax 0.8mg  daily.  -10/24 Required IC this AM, continue to monitor  PVRs  -Timed voids every 4 hours, consider UA if urinary symptoms worsen -05/16/23 PVRs decreasing, hasn't needed cath since yesterday; monitor -05/17/23 needed cath last night for and in the afternoon yesterday; defer to weekday team regarding management of this  17. Constipation. Appears to be having regular Bms.   -Cont miralax prn -05/17/23 last BM this morning but loose, no stool softeners being given, likely from doxycycline, monitor for ongoing diarrhea  LOS: 4 days A FACE TO FACE EVALUATION WAS PERFORMED  [address] 05/17/2023, 10:51 AM

## 2023-05-18 ENCOUNTER — Encounter (HOSPITAL_COMMUNITY): Payer: Self-pay | Admitting: Student

## 2023-05-18 DIAGNOSIS — R0989 Other specified symptoms and signs involving the circulatory and respiratory systems: Secondary | ICD-10-CM

## 2023-05-18 DIAGNOSIS — S51001D Unspecified open wound of right elbow, subsequent encounter: Secondary | ICD-10-CM | POA: Diagnosis not present

## 2023-05-18 DIAGNOSIS — G4700 Insomnia, unspecified: Secondary | ICD-10-CM

## 2023-05-18 DIAGNOSIS — T1490XA Injury, unspecified, initial encounter: Secondary | ICD-10-CM | POA: Diagnosis not present

## 2023-05-18 DIAGNOSIS — K5900 Constipation, unspecified: Secondary | ICD-10-CM | POA: Diagnosis not present

## 2023-05-18 LAB — AEROBIC CULTURE W GRAM STAIN (SUPERFICIAL SPECIMEN)
Culture: NO GROWTH
Gram Stain: NONE SEEN

## 2023-05-18 LAB — CULTURE, BLOOD (ROUTINE X 2)
Culture: NO GROWTH
Culture: NO GROWTH
Special Requests: ADEQUATE
Special Requests: ADEQUATE

## 2023-05-18 LAB — CBC
HCT: 32.6 % — ABNORMAL LOW (ref 39.0–52.0)
Hemoglobin: 10 g/dL — ABNORMAL LOW (ref 13.0–17.0)
MCH: 30.8 pg (ref 26.0–34.0)
MCHC: 30.7 g/dL (ref 30.0–36.0)
MCV: 100.3 fL — ABNORMAL HIGH (ref 80.0–100.0)
Platelets: 446 10*3/uL — ABNORMAL HIGH (ref 150–400)
RBC: 3.25 MIL/uL — ABNORMAL LOW (ref 4.22–5.81)
RDW: 17.2 % — ABNORMAL HIGH (ref 11.5–15.5)
WBC: 12 10*3/uL — ABNORMAL HIGH (ref 4.0–10.5)
nRBC: 0 % (ref 0.0–0.2)

## 2023-05-18 LAB — BASIC METABOLIC PANEL
Anion gap: 6 (ref 5–15)
BUN: 17 mg/dL (ref 8–23)
CO2: 27 mmol/L (ref 22–32)
Calcium: 8.9 mg/dL (ref 8.9–10.3)
Chloride: 100 mmol/L (ref 98–111)
Creatinine, Ser: 0.74 mg/dL (ref 0.61–1.24)
GFR, Estimated: >60 mL/min (ref >60)
Glucose, Bld: 151 mg/dL — ABNORMAL HIGH (ref 70–99)
Potassium: 4.1 mmol/L (ref 3.5–5.1)
Sodium: 133 mmol/L — ABNORMAL LOW (ref 135–145)

## 2023-05-18 MED ORDER — QUETIAPINE FUMARATE 50 MG PO TABS
75.0000 mg | ORAL_TABLET | Freq: Every evening | ORAL | Status: DC | PRN
  Administered 2023-05-18: 75 mg via ORAL
  Filled 2023-05-18: qty 1

## 2023-05-18 MED ORDER — GUAIFENESIN ER 600 MG PO TB12
600.0000 mg | ORAL_TABLET | Freq: Two times a day (BID) | ORAL | Status: DC
  Administered 2023-05-18 – 2023-05-24 (×12): 600 mg via ORAL
  Filled 2023-05-18 (×11): qty 1

## 2023-05-18 NOTE — Progress Notes (Signed)
Physical Therapy Session Note  Patient Details  Name: Louis Juarez. MRN: [id number] Date of Birth: 01/16/1941  Today's Date: 05/18/2023 PT Individual Time: 0800-0859, 1300-1403 PT Individual Time Calculation (min): 59 min, 63 min  PT Missed time Calculation: 12 min (fatigue)   Short Term Goals: Week 1:  PT Short Term Goal 1 (Week 1): Pt will complete bed mobility with modA. PT Short Term Goal 2 (Week 1): Pt will complete bed to chair transfer with modA. PT Short Term Goal 3 (Week 1): Pt will accurately recall and maintain WB precautions.  Skilled Therapeutic Interventions/Progress Updates:      Treatment Session 1  Pt supine in bed upon arrival with nursing in room for breathing treatment. Pt agreeable to therapy. Pt denies any pain at rest, reports 7/10 pain with bed mobility and slide board transfer. Premedicated. Therapist provided rest and repositioning as needed.   Pt able to recall R LE NWBing precautions with minimal questioning cue, but needs assist for maintenance of R LE NWBing precautions.   Vitals Assessed RA O2 95 and above, HR 77-86  Discussed pt home set up: pt reports he lives in [facility] with 3 steps to enter. Education provided on need for ramp. Pt reports his wife and son are taking care of it. Notified social worker to follow up with pt family 2/2 pt cognitive deficits.   Pt donned pants with total A while rolling R and L with supervision and increased time, with use of bed features, and max verbal cues provided for technique. R sidelying  to sit with heavy max A for R LE, and trunk 2/2 R UE pain, and rib pain verbal cues provided for technique.   Pt seated EOB with min A for postural control as pt demos significant forward and L lateral trunk flexion, with heavy L UE support, pt reports pain relief with therapist assisting upright posture unable to self sustain 2/2 pain and fatigue. Pt heavily breathing through mouth, pt perform pursed lip breathing x10 with  verbal/tactile and visual cues from therapist.   Pt performed slide board transfer from elevated hospital bed to L with heavy mod-max A with therapist lifting R LE for maintenance of NWB precuations, max verbal cues provided for technique with emphasis on head hip ratio.   Pt self propelled WC x150 feet from room to ortho gym with B UE and supervision, verbal cues provided for pursed lip breathing versus mouth breathing.   Education provided throughout to cough when pt feels need to to assist with secretion clearance. Pt verbalized understanding.   Pt perform posterior scooting in chair with min A for R LE for maintenance of NWBing precautions. Pt reports rectal pain with posterior scoot.   Pt performed active assised R LE LAQ and R LE seated marching x10 each, verbal/tactile cues provided for technique and to reduce compensation.   Pt seated in [facility] with seatbelt alarm on and end of session with all needs within reach.   Treatment Session 2   Pt supine in bed with HOB elevated upon arrival. Pt agreeable to therapy. Pt reports 9/10 pain in buttocks, and ribs, primarily with movement, premedicated. Therapist provided pt with rest and repositioning as needed for pain.   Pt refusing to get out of bed 2/2 pain. Pt agreeable to sitting EOB.   Vitals Assessed RA O2 99-97, pt has gurgles with secretions, therapist provided education on cause of gurgles and difficuty breathing. Educaiton provided on importance of movement to assist with  cough to assist with secretion clearance and reduce risk of pneumonia.  Pt verbalized understanding and agreeable.   Pt performed supine to sit with use of bed features and mod A for trunk and B LE, 2/2 inability to tolerate 2/2 buttock/pelvic pain. Pt seated EOB for ~8 min, while coughing, pt able to clear yellow liquid secretions. Pt attempted to urinate in urinal while seated but unable, requesting to ly down. Pt unable to urinate while lying down.   Education  provided to nursing on pt reports of "rectal pain" with movement. Pt performed rolling B with use of bed rails and supervision while nurse performed skin assessment of buttocks.  Pt scooted up in bed with B UE support on bedrails and L UE in knee flexion with min A for positioning L LE.   Pt performed 10 reps of incentive spirometer, with verbal cues provided for technique. Pt able to reach the 750 marker each time.   Pt performed the following therex for B LE/UE strengthening, activity tolerance, and upright tolerance.   1x10 horizontal chest press with 2# dowel rod   1x10 shoulder press with 2# dowel rod  1x10 ankle pumps B  1x10 B internal rotation to neutral B  Requesting pt to perform SAQ, pt refusing and reports ability to tolerate anymore therapy 2/2 pain/fatigue.       Pt missed 12 minutes 2/2 fatigue, and inability to tolerate additional therapy.           Therapy Documentation Precautions:  Precautions Precautions: Fall Restrictions Weight Bearing Restrictions: Yes RUE Weight Bearing: Weight bearing as tolerated RLE Weight Bearing: Non weight bearing LLE Weight Bearing: Weight bearing as tolerated Other Position/Activity Restrictions: LLE WBAT for transfers only  Therapy/Group: Individual Therapy  [name] 05/18/2023, 7:23 AM

## 2023-05-18 NOTE — Progress Notes (Signed)
PROGRESS NOTE   Subjective/Complaints:  Patient reports he had poor sleep last night.  Patient was agreeable and a breathing treatment by nursing for chest congestion.  Patient denies any shortness of breath.  ROS: Patient denies fever, rash, sore throat, blurred vision, dizziness, nausea, vomiting, diarrhea, cough, shortness of breath  headache, or mood change. Denies constipation today + Right pelvis area pain-improving + Insomnia + Chest congestion-chronic  Objective:   No results found. Recent Labs    05/16/23 0548 05/18/23 0759  WBC 10.4 12.0*  HGB 9.1* 10.0*  HCT 29.2* 32.6*  PLT 472* 446*   Recent Labs    05/16/23 0548 05/18/23 0759  NA 135 133*  K 4.2 4.1  CL 99 100  CO2 28 27  GLUCOSE 148* 151*  BUN 18 17  CREATININE 0.82 0.74  CALCIUM 9.2 8.9    Intake/Output Summary (Last 24 hours) at 05/18/2023 1316 Last data filed at 05/18/2023 1201 Gross per 24 hour  Intake 660 ml  Output 725 ml  Net -65 ml        Physical Exam: Vital Signs Blood pressure (!) 141/60, pulse 69, temperature 97.7 F (36.5 C), temperature source Oral, resp. rate 18, height 5\' 10"  (1.778 m), weight 79.4 kg, SpO2 91%.   General: No apparent distress, laying in bed, appears a little fatigued HEENT: MMM, EOMI Neck: Supple without JVD or lymphadenopathy Heart: Reg rate and rhythm. No m/r/g appreciated Chest: slight expiratory congestion/wheeze ,no other w/r/r, no increased WOB Abdomen: Soft, non-tender, non-distended, bowel sounds positive. Extremities: No clubbing, cyanosis, or edema. Pulses are 2+ Psych: Pt's affect is appropriate. Pt is cooperative  Musculoskeletal:     Comments: Wound VAC in place to R elbow laceration-minimal drainage   Prepubic and right lower quadrant incisions C/D/I skin place.   No significant right thigh tenderness noted today Minimal pain with right hip passive ROM Neurological:     Mental  Status: He is oriented to person, place, and time. Follows commands. + memory deficits   Assessment/Plan: 1. Functional deficits which require 3+ hours per day of interdisciplinary therapy in a comprehensive inpatient rehab setting. Physiatrist is providing close team supervision and 24 hour management of active medical problems listed below. Physiatrist and rehab team continue to assess barriers to discharge/monitor patient progress toward functional and medical goals  Care Tool:  Bathing    Body parts bathed by patient: Right arm, Left arm, Chest, Abdomen, Face   Body parts bathed by helper: Front perineal area, Buttocks, Right upper leg, Left upper leg, Right lower leg, Left lower leg     Bathing assist Assist Level: Total Assistance - Patient < 25%     Upper Body Dressing/Undressing Upper body dressing   What is the patient wearing?: Hospital gown only    Upper body assist Assist Level: Moderate Assistance - Patient 50 - 74%    Lower Body Dressing/Undressing Lower body dressing      What is the patient wearing?: Hospital gown only, Incontinence brief     Lower body assist Assist for lower body dressing: Dependent - Patient 0%     Toileting Toileting    Toileting assist Assist for toileting: Dependent -  Patient 0%     Transfers Chair/bed transfer  Transfers assist     Chair/bed transfer assist level: Maximal Assistance - Patient 25 - 49%     Locomotion Ambulation   Ambulation assist   Ambulation activity did not occur: Safety/medical concerns          Walk 10 feet activity   Assist  Walk 10 feet activity did not occur: Safety/medical concerns        Walk 50 feet activity   Assist Walk 50 feet with 2 turns activity did not occur: Safety/medical concerns         Walk 150 feet activity   Assist Walk 150 feet activity did not occur: Safety/medical concerns         Walk 10 feet on uneven surface  activity   Assist Walk 10  feet on uneven surfaces activity did not occur: Safety/medical concerns         Wheelchair     Assist Is the patient using a wheelchair?: Yes Type of Wheelchair: Manual    Wheelchair assist level: Supervision/Verbal cueing Max wheelchair distance: 150    Wheelchair 50 feet with 2 turns activity    Assist        Assist Level: Supervision/Verbal cueing   Wheelchair 150 feet activity     Assist      Assist Level: Supervision/Verbal cueing   Blood pressure (!) 141/60, pulse 69, temperature 97.7 F (36.5 C), temperature source Oral, resp. rate 18, height 5\' 10"  (1.778 m), weight 79.4 kg, SpO2 91%.  Medical Problem List and Plan: 1. Functional deficits secondary to polytrauma             -patient may shower             -ELOS/Goals: 14-20 days S with PT/OT             -Continue CIR -SLP consult for cognition-felt to be at baseline, slums score of 21 out of 30 2.  Antithrombotics: -DVT/anticoagulation:  Pharmaceutical: Lovenox 30mg  BID, Eliquis 2.5 mg twice daily for 30 days on discharge for DVT prophylaxis             -antiplatelet therapy: N/AA 3. Pain Management: continue tylenol 650mg  q6h, robaxin 1000mg  q8h, Oxycodone as needed severe pain -10/25 Will schedule oxycodone 5 mg at 7 AM, gabapentin 100 mg 3 times daily started 4. Mood/Behavior/Sleep: LCSW to follow for evaluation and support             -antipsychotic agents: Seroquel 50mg  as needed  -Lorazepam 0.5mg  QHS -05/16/23 sleeping fair but wants to try increased melatonin dose, inc to 5mg  at bedtime, monitor sleep for now-- improved 05/17/23! -05/18/2023 will increase Seroquel to 75 mg at bedtime 5. Neuropsych/cognition: This patient is  capable of making decisions on his own behalf. 6. Skin/Wound Care: Routine pressure-relief measures.   --Monitor right elbow for drainage/healing. 7. Fluids/Electrolytes/Nutrition: Monitor I/os.  Continue Ensure supplements.             -- Add vitamin C/D3, MVI, zinc  and Juven. 8. Pelvic ring fracture s/p percutaneous fixation: Strict NWB RLE and WBAT LLE for transfers only. 9. Right elbow wound drainage: Now on doxycycline.  Reports night sweats.  Cultures sent -Discussed with Dr. Jena Gauss yesterday who recommended Doxycycline 100 mg twice daily, his team plans to follow-up and check on him and CIR.   -10/25 wound VAC started by Ortho appreciate assistance, continue current antibiotics, will recheck labs tomorrow -05/16/23 BCx and elbow Cx  NGTD x3 days, WBC down to 10.4 today; monitor-- still NGTD 05/17/23 -10/28 WBC 12.0 today, continue doxycycline and wound VAC 10. Acute blood loss anemia: Monitor for any signs of bleeding.  Hgb stable 9.1 on 05/16/23 11. Leukocytosis: White count trending down from 23.6-->12.9>> 10.1 10/26; monitor routinely 12.  Thrombocytosis: Likely reactive.  Continue to monitor. 472 on 05/16/23 13. Low protein stores: Albumin down to 2.2--supplements added. 14. H/o NICM/HF s/p ICD:  Daily weights/monitor for signs of overload. Change diet to heart healthy.  No signs of fluid overload -- On Coreg 25mg  BID, aldactone 25mg  QD, Lasix 20mg  BID, and Pravachol 80mg  daily, Zetia 10mg  daily             --decrease Kdur to 20 meq bid as K trending up.  --Repeat chest x-ray in a.m.- no acute findings -10/28 weight and BP stable overall, continue to monitor [facility] Weights   05/16/23 0525 05/17/23 0519 05/18/23 0632  Weight: 81.1 kg 79.5 kg 79.4 kg   Vitals:   05/15/23 0801 05/15/23 1249 05/15/23 2001 05/16/23 0409  BP: 129/62 137/60 (!) 114/45 (!) 142/58   05/16/23 1506 05/16/23 1952 05/17/23 0258 05/17/23 1311  BP: (!) 127/50 (!) 135/58 131/70 134/65   05/17/23 1946 05/18/23 0500 05/18/23 0729 05/18/23 0729  BP: (!) 129/57 (!) 107/96 (!) 141/60 (!) 141/60     15. Multiple rib fractures: Encourage pulmonary hygiene. Incentive spirometer ordered  -10/28 start Mucinex for chest congestion   16. Urinary retention: Monitor voiding with PVR  checks.  continue Flomax 0.8mg  daily.  -10/24 Required IC this AM, continue to monitor  PVRs  -Timed voids every 4 hours, consider UA if urinary symptoms worsen -05/16/23 PVRs decreasing, hasn't needed cath since yesterday; monitor -05/17/23 needed cath last night for and in the afternoon yesterday; defer to weekday team regarding management of this  17. Constipation. Appears to be having regular Bms.   -Cont miralax prn -05/17/23 last BM this morning but loose, no stool softeners being given, likely from doxycycline, monitor for ongoing diarrhea -10/28 last BM today, continue to monitor  LOS: 5 days A FACE TO FACE EVALUATION WAS PERFORMED  Fanny Dance 05/18/2023, 1:16 PM

## 2023-05-19 ENCOUNTER — Inpatient Hospital Stay (HOSPITAL_COMMUNITY): Payer: BC Managed Care – PPO

## 2023-05-19 DIAGNOSIS — I428 Other cardiomyopathies: Secondary | ICD-10-CM

## 2023-05-19 DIAGNOSIS — R0989 Other specified symptoms and signs involving the circulatory and respiratory systems: Secondary | ICD-10-CM | POA: Diagnosis not present

## 2023-05-19 DIAGNOSIS — T1490XA Injury, unspecified, initial encounter: Secondary | ICD-10-CM | POA: Diagnosis not present

## 2023-05-19 DIAGNOSIS — R339 Retention of urine, unspecified: Secondary | ICD-10-CM

## 2023-05-19 DIAGNOSIS — K5900 Constipation, unspecified: Secondary | ICD-10-CM | POA: Diagnosis not present

## 2023-05-19 MED ORDER — SERTRALINE HCL 50 MG PO TABS
25.0000 mg | ORAL_TABLET | Freq: Every day | ORAL | Status: DC
  Administered 2023-05-19 – 2023-06-01 (×14): 25 mg via ORAL
  Filled 2023-05-19 (×14): qty 1

## 2023-05-19 MED ORDER — OXYCODONE HCL 5 MG PO TABS
10.0000 mg | ORAL_TABLET | Freq: Two times a day (BID) | ORAL | Status: DC
  Administered 2023-05-20 – 2023-05-29 (×20): 10 mg via ORAL
  Filled 2023-05-19 (×21): qty 2

## 2023-05-19 NOTE — Progress Notes (Addendum)
PROGRESS NOTE   Subjective/Complaints:  Patient working with OT this morning.  He just had a bowel movement.  Reports he is bringing up some of his chest congestion today as having some cough.  Denies shortness of breath.  Patient reports increased pain in his pelvis especially with activity and therapy.  He does not have a lot of pain at rest.  ROS: Patient denies fever, rash, sore throat, blurred vision, dizziness, nausea, vomiting, diarrhea, cough, shortness of breath  headache, or mood change. Denies constipation today + Right pelvis area pain-improving + Insomnia + Chest congestion and cough-chronic  Objective:   No results found. Recent Labs    05/18/23 0759  WBC 12.0*  HGB 10.0*  HCT 32.6*  PLT 446*   Recent Labs    05/18/23 0759  NA 133*  K 4.1  CL 100  CO2 27  GLUCOSE 151*  BUN 17  CREATININE 0.74  CALCIUM 8.9    Intake/Output Summary (Last 24 hours) at 05/19/2023 1058 Last data filed at 05/19/2023 0800 Gross per 24 hour  Intake 832 ml  Output 775 ml  Net 57 ml        Physical Exam: Vital Signs Blood pressure (!) 144/63, pulse 74, temperature 97.6 F (36.4 C), temperature source Oral, resp. rate 18, height 5\' 10"  (1.778 m), weight 79.4 kg, SpO2 93%.   General: No apparent distress, laying in bed, getting dressed with therapy HEENT: MMM, EOMI Neck: Supple without JVD or lymphadenopathy Heart: Reg rate and rhythm. No m/r/g appreciated Chest: slight expiratory congestion/wheeze ,no other w/r/r, no increased WOB, plus productive cough Abdomen: Soft, non-tender, non-distended, bowel sounds positive. Extremities: No clubbing, cyanosis, or edema. Pulses are 2+ Psych: Pt's affect is appropriate. Pt is cooperative  Musculoskeletal:     Comments: Wound VAC in place to R elbow laceration-no drainage noted in tubing  Prepubic and right lower quadrant incisions C/D/I  No significant right thigh  tenderness noted today Minimal pain with right hip passive ROM Neurological:     Mental Status: He is oriented to person, place, and time. Follows commands. + memory deficits   Assessment/Plan: 1. Functional deficits which require 3+ hours per day of interdisciplinary therapy in a comprehensive inpatient rehab setting. Physiatrist is providing close team supervision and 24 hour management of active medical problems listed below. Physiatrist and rehab team continue to assess barriers to discharge/monitor patient progress toward functional and medical goals  Care Tool:  Bathing    Body parts bathed by patient: Right arm, Left arm, Chest, Abdomen, Face   Body parts bathed by helper: Front perineal area, Buttocks, Right upper leg, Left upper leg, Right lower leg, Left lower leg     Bathing assist Assist Level: Total Assistance - Patient < 25%     Upper Body Dressing/Undressing Upper body dressing   What is the patient wearing?: Hospital gown only    Upper body assist Assist Level: Moderate Assistance - Patient 50 - 74%    Lower Body Dressing/Undressing Lower body dressing      What is the patient wearing?: Hospital gown only, Incontinence brief     Lower body assist Assist for lower body dressing: Dependent -  Patient 0%     Editor, commissioning assist Assist for toileting: Dependent - Patient 0%     Transfers Chair/bed transfer  Transfers assist     Chair/bed transfer assist level: Maximal Assistance - Patient 25 - 49%     Locomotion Ambulation   Ambulation assist   Ambulation activity did not occur: Safety/medical concerns          Walk 10 feet activity   Assist  Walk 10 feet activity did not occur: Safety/medical concerns        Walk 50 feet activity   Assist Walk 50 feet with 2 turns activity did not occur: Safety/medical concerns         Walk 150 feet activity   Assist Walk 150 feet activity did not occur:  Safety/medical concerns         Walk 10 feet on uneven surface  activity   Assist Walk 10 feet on uneven surfaces activity did not occur: Safety/medical concerns         Wheelchair     Assist Is the patient using a wheelchair?: Yes Type of Wheelchair: Manual    Wheelchair assist level: Supervision/Verbal cueing Max wheelchair distance: 150    Wheelchair 50 feet with 2 turns activity    Assist        Assist Level: Supervision/Verbal cueing   Wheelchair 150 feet activity     Assist      Assist Level: Supervision/Verbal cueing   Blood pressure (!) 144/63, pulse 74, temperature 97.6 F (36.4 C), temperature source Oral, resp. rate 18, height 5\' 10"  (1.778 m), weight 79.4 kg, SpO2 93%.  Medical Problem List and Plan: 1. Functional deficits secondary to polytrauma             -patient may shower             -ELOS/Goals: 14-20 days S with PT/OT             -Continue CIR -SLP consult for cognition-felt to be at baseline, slums score of 21 out of 30 -Team conference tomorrow 2.  Antithrombotics: -DVT/anticoagulation:  Pharmaceutical: Lovenox 30mg  BID, Eliquis 2.5 mg twice daily for 30 days on discharge for DVT prophylaxis             -antiplatelet therapy: N/AA 3. Pain Management: continue tylenol 650mg  q6h, robaxin 1000mg  q8h, Oxycodone as needed severe pain -10/25 Will schedule oxycodone 5 mg at 7 AM, gabapentin 100 mg 3 times daily started 4. Mood/Behavior/Sleep: LCSW to follow for evaluation and support             -antipsychotic agents: Seroquel 50mg  as needed  -Lorazepam 0.5mg  QHS -05/16/23 sleeping fair but wants to try increased melatonin dose, inc to 5mg  at bedtime, monitor sleep for now-- improved 05/17/23! -05/18/2023 will increase Seroquel to 75 mg at bedtime 5. Neuropsych/cognition: This patient is  capable of making decisions on his own behalf. 6. Skin/Wound Care: Routine pressure-relief measures.   --Monitor right elbow for  drainage/healing. 7. Fluids/Electrolytes/Nutrition: Monitor I/os.  Continue Ensure supplements.             -- Add vitamin C/D3, MVI, zinc and Juven. 8. Pelvic ring fracture s/p percutaneous fixation: Strict NWB RLE and WBAT LLE for transfers only. 9. Right elbow wound drainage: Now on doxycycline.  Reports night sweats.  Cultures sent -Discussed with Dr. Jena Gauss yesterday who recommended Doxycycline 100 mg twice daily, his team plans to follow-up and check on him and CIR.   -  10/25 wound VAC started by Ortho appreciate assistance, continue current antibiotics, will recheck labs tomorrow -05/16/23 BCx and elbow Cx NGTD x3 days, WBC down to 10.4 today; monitor-- still NGTD 05/17/23 -10/28 WBC 12.0 today, continue doxycycline and wound VAC 10. Acute blood loss anemia: Monitor for any signs of bleeding.  Hgb stable 9.1 on 05/16/23 11. Leukocytosis: White count trending down from 23.6-->12.9>> 10.1 10/26; monitor routinely 12.  Thrombocytosis: Likely reactive.  Continue to monitor.  446 on 10/28 13. Low protein stores: Albumin down to 2.2--supplements added. 14. H/o NICM/HF s/p ICD:  Daily weights/monitor for signs of overload. Change diet to heart healthy.  No signs of fluid overload -- On Coreg 25mg  BID, aldactone 25mg  QD, Lasix 20mg  BID, and Pravachol 80mg  daily, Zetia 10mg  daily             --decrease Kdur to 20 meq bid as K trending up.  --Repeat chest x-ray in a.m.- no acute findings -10/29 recheck chest x-ray today Filed Weights   05/16/23 0525 05/17/23 0519 05/18/23 0632  Weight: 81.1 kg 79.5 kg 79.4 kg   Vitals:   05/16/23 1506 05/16/23 1952 05/17/23 0258 05/17/23 1311  BP: (!) 127/50 (!) 135/58 131/70 134/65   05/17/23 1946 05/18/23 0500 05/18/23 0729 05/18/23 0729  BP: (!) 129/57 (!) 107/96 (!) 141/60 (!) 141/60   05/18/23 1300 05/18/23 1425 05/18/23 1929 05/19/23 0447  BP: (!) 138/53 (!) 125/57 (!) 131/59 (!) 144/63     15. Multiple rib fractures: Encourage pulmonary hygiene.  Incentive spirometer ordered  -10/29 patient bringing up more mucus today, may be due to the Mucinex   16. Urinary retention: Monitor voiding with PVR checks.  continue Flomax 0.8mg  daily.  -10/24 Required IC this AM, continue to monitor  PVRs  -Timed voids every 4 hours, consider UA if urinary symptoms worsen -05/16/23 PVRs decreasing, hasn't needed cath since yesterday; monitor -05/17/23 needed cath last night for and in the afternoon yesterday; defer to weekday team regarding management of this -10/29 improved continence, last PVR was 148, 172.  Continue current regimen with Flomax  17. Constipation. Appears to be having regular Bms.   -Cont miralax prn -05/17/23 last BM this morning but loose, no stool softeners being given, likely from doxycycline, monitor for ongoing diarrhea -10/29 reports bowel movement today  LOS: 6 days A FACE TO FACE EVALUATION WAS PERFORMED  Fanny Dance 05/19/2023, 10:58 AM

## 2023-05-19 NOTE — Plan of Care (Signed)
  Problem: Consults Goal: RH GENERAL PATIENT EDUCATION Description: See Patient Education module for education specifics. 05/19/2023 0502 by Pamala Duffel, RN Outcome: Progressing 05/19/2023 0502 by Pamala Duffel, RN Outcome: Progressing Goal: Skin Care Protocol Initiated - if Braden Score 18 or less Description: If consults are not indicated, leave blank or document N/A 05/19/2023 0502 by Pamala Duffel, RN Outcome: Progressing 05/19/2023 0502 by Pamala Duffel, RN Outcome: Progressing

## 2023-05-19 NOTE — Progress Notes (Signed)
Occupational Therapy Session Note  Patient Details  Name: Louis Juarez. MRN: [id number] Date of Birth: 1941-04-21  Today's Date: 05/19/2023 OT Individual Time: 4098-1191 OT Individual Time Calculation (min): 43 min   Today's Date: 05/19/2023 OT Individual Time: 1450-1520 OT Individual Time Calculation (min): 30 min  and Today's Date: 05/19/2023 OT Missed Time: 10 Minutes Missed Time Reason: Nursing care  Short Term Goals: Week 1:  OT Short Term Goal 1 (Week 1): Pt will transfer with TB to [facility] with mod A to and from L side OT Short Term Goal 2 (Week 1): Pt will complete LB dressing with AE at EOB with mod a OT Short Term Goal 3 (Week 1): Pt will complete grooming sinkside seated with S w/c level  Skilled Therapeutic Interventions/Progress Updates:   Session 1: Pt received resting in bed for skilled OT session with focus on BADL participation. Pt agreeable to interventions, demonstrating overall flat mood/affect. Pt with un-rated pain with mobility, pre-medicated OT offering intermediate rest breaks and positioning suggestions throughout session to address pain/fatigue and maximize participation/safety in session.   Pt comes to EOB with HOB elevated, cuing for technique, and Mod A for trunk elevation + RLE management. At EOB, pt found to be incontinent of large B/B void, boosting self towards [facility] with BUE/LLE (therapist elevating RLE), returned to supine for care. Pt rolls with supervision, dependent for posterior care, anterior care with cuing for encouragement. Pt declines dressing, unable to progress with OOB activity due to time constraints.   Pt remained resting in bed with all immediate needs met at end of session. Pt continues to be appropriate for skilled OT intervention to promote further functional independence.   Session 2: Pt received resting in bed for skilled OT session with focus on bed mobility and LB dressing. Pt agreeable to interventions, demonstrating overall  pleasant mood. Pt with un-rated pain at fracture sites with bed mobility. OT offering intermediate rest breaks and positioning suggestions throughout session to address pain/fatigue and maximize participation/safety in session.   Pt comes to EOB with cuing provided for technique, Mod A for trunk elevation and RLE management. At EOB, pt able to thread BLE into shorts with Min A + increased time due to pain. Pt able to scoot down length of bed towards [facility] with assistance for RLE elevation for precaution adherence, all to target movement/strength in preparation for squat pivot transfers. Pt returned to supine for brief change, as he was incontinent of small BM. Dependent for care, rolling with supervision. Boosting self in bed with cuing for technique.   Wound vac found with a tear, pt missing ~10 mins of skilled intervention for nursing care.   Pt remained resting in bed with RN present, all immediate needs met at end of session. Pt continues to be appropriate for skilled OT intervention to promote further functional independence.   Therapy Documentation Precautions:  Precautions Precautions: Fall Restrictions Weight Bearing Restrictions: Yes RUE Weight Bearing: Weight bearing as tolerated RLE Weight Bearing: Non weight bearing LLE Weight Bearing: Weight bearing as tolerated Other Position/Activity Restrictions: LLE WBAT for transfers only   Therapy/Group: Individual Therapy  [name], OTR/L, MSOT  05/19/2023, 5:59 AM

## 2023-05-19 NOTE — Progress Notes (Addendum)
Physical Therapy Session Note  Patient Details  Name: Louis Juarez. MRN: [id number] Date of Birth: [birth year]  Today's Date: 05/19/2023 PT Individual Time: 1884-1660, 6301-6010 PT Individual Time Calculation (min): 42 min, 57 min   Short Term Goals: Week 1:  PT Short Term Goal 1 (Week 1): Pt will complete bed mobility with modA. PT Short Term Goal 2 (Week 1): Pt will complete bed to chair transfer with modA. PT Short Term Goal 3 (Week 1): Pt will accurately recall and maintain WB precautions.  Skilled Therapeutic Interventions/Progress Updates:      Treatment Session 1 Pt supine in bed upon arrival. Pt agreeable to therapy. Pt reports 5/10 pain at rest, 9/10 pain after squat pivot transfer, and 7/10 pain with seated rest break in [facility], nurse present to administer pain medication during session, therpaist provided seated rest breaks and repositioning throughout session.   Therapist managing wound vac throughout session. Vitals Assessed: RA O2 95 and above throughout session. Therapist encourgaing pt to cough and breathe in through the nose and out through the mouth versus mouth breathing throughout session.   Pt donned pants in supine with min A for feeding over B LE, and use of L LE for bridging, and L sidelying to pull up over buttocks. Pt performed supine to sit with heavy min A and increased time, with use of L LE to assist R LE with use of bed rails with HOB flat, verbal cues provided for technique.   Therapist provided education and demonstration for squat pivot transfer. Pt performed squat pivot transfer elevated hospital bed to [facility] with min -mod A for maintenance of R LE NWBing precautions. Verbal cues provided for technique. Pt reports preference for squat pivot versus slide board transfer.   Pt transported dependent in WC to main gym.  Pt performed squat pivot transfer WC to lowest setting mat table with mod-max A for maintenance of R LE NWB and trunk stability as pt demos heavy  posterior LOB 2/2 R hip and rib pain.   Pt performed squat pivot transfer mat table to WC to L with min A for maintenance of R LE NWBing precautions.   Pt transported dependent in [facility] to room. Nurse reports plan for chest x-ray. Pt performed slide board transfer WC to bed to R with use of bed rail with mod-max A 2/2 posterior LOB 2/2 R hip and rib pain.   Pt supine in bed with HOB elevated, and bed alarm on with needs within reach.   Treatment Session 2    Pt supine in bed upon arrival. Pt agreeable to therapy. Pt reporting hip pain, nurse present to administer medication, therapist provided rest breaks and repositioning as needed for pain.   Supine to sit with bed rails and HOB elevated with mod A, for L LE and trunk, and increased time, verbal and tactile cues provided for technique.   Pt incontinent of bladder. Sit to supine with mod A for B LE and trunk, verbal cues provided for technique. Rolling B with supervision to perform pericare, and place bed pan as pt demo mild incontinence of bowel and flatulence. Donned brief with total A while pt performed rolling with supervision with use of bed rails.   Supine to sit with bed features and mod A, vebral and tactile cues provided for technique.   Seated EOB, with min A/close supervision, verbal cues provided for midline orientation, as pt demos L lateral trunk lean tendency with heavy B UE support, pt maintained  midline orientation with B overhead reaches x10 with CGA/close supervision.   Discussed squat pivot transfer, education and demonstration provided for technique with emphasis on lifting R LE, and maintaining upright posture. Pt performed with min A to L for maintenance of precautions as pt unable to maintain during transfer, pt performed with mod A with improved technique and postural control to R.   Pt requesting to lay down 2/2 pain and fatigue. Sit to supine with max A for management of B LE and UE, verbal cues provided for technique  and positioning as pt demos tendency to flail head back with fatigue.   Pt supine in bed with all needs within reach and bed alarm on.     Therapy Documentation Precautions:  Precautions Precautions: Fall Restrictions Weight Bearing Restrictions: Yes RUE Weight Bearing: Weight bearing as tolerated RLE Weight Bearing: Non weight bearing LLE Weight Bearing: Weight bearing as tolerated Other Position/Activity Restrictions: LLE WBAT for transfers only  Therapy/Group: Individual Therapy  [facility] [name], [place], DPT  05/19/2023, 7:56 AM

## 2023-05-19 NOTE — Plan of Care (Signed)
  Problem: Consults Goal: RH GENERAL PATIENT EDUCATION Description: See Patient Education module for education specifics. Outcome: Progressing Goal: Skin Care Protocol Initiated - if Braden Score 18 or less Description: If consults are not indicated, leave blank or document N/A Outcome: Progressing   Problem: RH BOWEL ELIMINATION Goal: RH STG MANAGE BOWEL WITH ASSISTANCE Description: STG Manage Bowel with min Assistance. Outcome: Progressing Goal: RH STG MANAGE BOWEL W/MEDICATION W/ASSISTANCE Description: STG Manage Bowel with Medication with min Assistance. Outcome: Progressing   Problem: RH BLADDER ELIMINATION Goal: RH STG MANAGE BLADDER WITH ASSISTANCE Description: STG Manage Bladder With min Assistance Outcome: Progressing Goal: RH STG MANAGE BLADDER WITH MEDICATION WITH ASSISTANCE Description: STG Manage Bladder With Medication With min Assistance. Outcome: Progressing   Problem: RH SAFETY Goal: RH STG ADHERE TO SAFETY PRECAUTIONS W/ASSISTANCE/DEVICE Description: STG Adhere to Safety Precautions With cueing Assistance/Device. Outcome: Progressing Goal: RH STG DECREASED RISK OF FALL WITH ASSISTANCE Description: STG Decreased Risk of Fall With min Assistance. Outcome: Progressing   Problem: RH KNOWLEDGE DEFICIT GENERAL Goal: RH STG INCREASE KNOWLEDGE OF SELF CARE AFTER HOSPITALIZATION Description: Patient/caregiver will be able to manage medications, self care, weight bearing precautions, and diet/lifestyle modifications to improve health from nursing education, nursing handouts, and other resources independently  Outcome: Progressing

## 2023-05-20 DIAGNOSIS — M009 Pyogenic arthritis, unspecified: Secondary | ICD-10-CM

## 2023-05-20 DIAGNOSIS — I428 Other cardiomyopathies: Secondary | ICD-10-CM | POA: Diagnosis not present

## 2023-05-20 DIAGNOSIS — T1490XA Injury, unspecified, initial encounter: Secondary | ICD-10-CM | POA: Diagnosis not present

## 2023-05-20 DIAGNOSIS — K5900 Constipation, unspecified: Secondary | ICD-10-CM | POA: Diagnosis not present

## 2023-05-20 MED ORDER — BOOST / RESOURCE BREEZE PO LIQD CUSTOM
1.0000 | Freq: Three times a day (TID) | ORAL | Status: DC
  Administered 2023-05-21 – 2023-06-01 (×20): 1 via ORAL

## 2023-05-20 NOTE — Progress Notes (Addendum)
PROGRESS NOTE   Subjective/Complaints:  Pt noted to have some loosening of hardware on xray. Dr. Jena Gauss recommends b/l NWB. Incisional wound vac removed RUE.  Slept better last night.   ROS: Patient denies fever, rash, sore throat, blurred vision, dizziness, nausea, vomiting, diarrhea, cough, shortness of breath  headache, or mood change. Denies constipation today + Right pelvis area pain + Insomnia + Chest congestion and cough-improved  Objective:   DG Chest 2 View  Result Date: 05/19/2023 CLINICAL DATA:  Pain EXAM: CHEST - 2 VIEW COMPARISON:  Chest x-ray 05/14/2023 FINDINGS: Left-sided ICD is again noted. The heart is mildly enlarged. There central pulmonary vascular congestion. There is no focal lung consolidation, pleural effusion or pneumothorax. No acute fractures are seen. IMPRESSION: Mild cardiomegaly with central pulmonary vascular congestion. Electronically Signed   By: Darliss Cheney M.D.   On: 05/19/2023 20:52   DG Pelvis Comp Min 3V  Result Date: 05/19/2023 CLINICAL DATA:  Pain in the pelvis EXAM: JUDET PELVIS - 3+ VIEW COMPARISON:  Pelvic x-ray 05/08/2023 FINDINGS: Multiple plates and screws are seen in the region of the bilateral sacroiliac joints, right acetabulum and along the pubic symphysis. Right inferior pubic ramus fracture is again seen. There is diastasis of the pubic symphysis measuring 1.8 cm (previously 0.6 cm). The fixation plate crossing the pubic symphysis appears stable in position in the left superior pubic ramus, but the lateral right 3 screws have shifted positions in the interval. There is no dislocation. IMPRESSION: 1. Increased diastasis of the pubic symphysis measuring 1.8 cm (previously 0.6 cm). 2. The fixation plate crossing the pubic symphysis appears stable in position in the left superior pubic ramus, but the lateral right 3 screws have shifted positions in the interval. Please correlate  clinically. Electronically Signed   By: Darliss Cheney M.D.   On: 05/19/2023 20:48   Recent Labs    05/18/23 0759  WBC 12.0*  HGB 10.0*  HCT 32.6*  PLT 446*   Recent Labs    05/18/23 0759  NA 133*  K 4.1  CL 100  CO2 27  GLUCOSE 151*  BUN 17  CREATININE 0.74  CALCIUM 8.9    Intake/Output Summary (Last 24 hours) at 05/20/2023 1110 Last data filed at 05/20/2023 0900 Gross per 24 hour  Intake 360 ml  Output 400 ml  Net -40 ml        Physical Exam: Vital Signs Blood pressure (!) 142/65, pulse 66, temperature 97.9 F (36.6 C), temperature source Oral, resp. rate 16, height 5\' 10"  (1.778 m), weight 79.1 kg, SpO2 96%.   General: No apparent distress, laying in bed, appears comfortable in bed HEENT: MMM, EOMI Neck: Supple without JVD or lymphadenopathy Heart: Reg rate and rhythm. No m/r/g appreciated Chest: slight expiratory congestion/wheeze ,no other w/r/r, no increased WOB, plus productive cough Abdomen: Soft, non-tender, non-distended, bowel sounds positive. Extremities: No clubbing, cyanosis, or edema. Pulses are 2+ Psych: Pt's affect is appropriate. Pt is cooperative  Musculoskeletal:     Comments: Wound VAC in place to R elbow laceration-no drainage noted in tubing No b/l thigh tenderness  Prepubic and right lower quadrant incisions C/D/I  Neurological:  Mental Status: He is oriented to person, place, and time. Follows commands. + memory deficits   Assessment/Plan: 1. Functional deficits which require 3+ hours per day of interdisciplinary therapy in a comprehensive inpatient rehab setting. Physiatrist is providing close team supervision and 24 hour management of active medical problems listed below. Physiatrist and rehab team continue to assess barriers to discharge/monitor patient progress toward functional and medical goals  Care Tool:  Bathing    Body parts bathed by patient: Right arm, Left arm, Chest, Abdomen, Face   Body parts bathed by helper:  Front perineal area, Buttocks, Right upper leg, Left upper leg, Right lower leg, Left lower leg     Bathing assist Assist Level: Total Assistance - Patient < 25%     Upper Body Dressing/Undressing Upper body dressing   What is the patient wearing?: Hospital gown only    Upper body assist Assist Level: Moderate Assistance - Patient 50 - 74%    Lower Body Dressing/Undressing Lower body dressing      What is the patient wearing?: Hospital gown only, Incontinence brief     Lower body assist Assist for lower body dressing: Dependent - Patient 0%     Toileting Toileting    Toileting assist Assist for toileting: Dependent - Patient 0%     Transfers Chair/bed transfer  Transfers assist     Chair/bed transfer assist level: Maximal Assistance - Patient 25 - 49%     Locomotion Ambulation   Ambulation assist   Ambulation activity did not occur: Safety/medical concerns          Walk 10 feet activity   Assist  Walk 10 feet activity did not occur: Safety/medical concerns        Walk 50 feet activity   Assist Walk 50 feet with 2 turns activity did not occur: Safety/medical concerns         Walk 150 feet activity   Assist Walk 150 feet activity did not occur: Safety/medical concerns         Walk 10 feet on uneven surface  activity   Assist Walk 10 feet on uneven surfaces activity did not occur: Safety/medical concerns         Wheelchair     Assist Is the patient using a wheelchair?: Yes Type of Wheelchair: Manual    Wheelchair assist level: Supervision/Verbal cueing Max wheelchair distance: 150    Wheelchair 50 feet with 2 turns activity    Assist        Assist Level: Supervision/Verbal cueing   Wheelchair 150 feet activity     Assist      Assist Level: Supervision/Verbal cueing   Blood pressure (!) 142/65, pulse 66, temperature 97.9 F (36.6 C), temperature source Oral, resp. rate 16, height 5\' 10"  (1.778 m),  weight 79.1 kg, SpO2 96%.  Medical Problem List and Plan: 1. Functional deficits secondary to polytrauma             -patient may shower             -ELOS/Goals: 14-20 days S with PT/OT             -Continue CIR -SLP consult for cognition-felt to be at baseline, slums score of 21 out of 30 -Team conference today please see physician documentation under team conference tab, met with team  to discuss problems,progress, and goals. Formulized individual treatment plan based on medical history, underlying problem and comorbidities.  --Dr. Jena Gauss rec NWB  2.  Antithrombotics: -DVT/anticoagulation:  Pharmaceutical: Lovenox 30mg  BID, Eliquis 2.5 mg twice daily for 30 days on discharge for DVT prophylaxis             -antiplatelet therapy: N/AA 3. Pain Management: continue tylenol 650mg  q6h, robaxin 1000mg  q8h, Oxycodone as needed severe pain -10/25 Will schedule oxycodone 5 mg at 7 AM, gabapentin 100 mg 3 times daily started 4. Mood/Behavior/Sleep: LCSW to follow for evaluation and support             -antipsychotic agents: Seroquel 50mg  as needed  -Lorazepam 0.5mg  QHS -05/16/23 sleeping fair but wants to try increased melatonin dose, inc to 5mg  at bedtime, monitor sleep for now-- improved 05/17/23! -05/18/2023 will increase Seroquel to 75 mg at bedtime -10/30 sleep improved 5. Neuropsych/cognition: This patient is  capable of making decisions on his own behalf. 6. Skin/Wound Care: Routine pressure-relief measures.   --Monitor right elbow for drainage/healing. 7. Fluids/Electrolytes/Nutrition: Monitor I/os.  Continue Ensure supplements.             -- Add vitamin C/D3, MVI, zinc and Juven. 8. Pelvic ring fracture s/p percutaneous fixation: Strict NWB RLE and WBAT LLE for transfers only.  -- hardware loosening noted, NWB b/l LE 9. Right elbow wound drainage: Now on doxycycline.  Reports night sweats.  Cultures sent -Discussed with Dr. Jena Gauss yesterday who recommended Doxycycline 100 mg twice  daily, his team plans to follow-up and check on him and CIR.   -10/25 wound VAC started by Ortho appreciate assistance, continue current antibiotics, will recheck labs tomorrow -05/16/23 BCx and elbow Cx NGTD x3 days, WBC down to 10.4 today; monitor-- still NGTD 05/17/23 -10/28 WBC 12.0 today, continue doxycycline and wound VAC -10/30 wound vac removed by ortho 10. Acute blood loss anemia: Monitor for any signs of bleeding.  Hgb stable 9.1 on 05/16/23 11. Leukocytosis: White count trending down from 23.6-->12.9>> 10.1 10/26; monitor routinely 12.  Thrombocytosis: Likely reactive.  Continue to monitor.  446 on 10/28 13. Low protein stores: Albumin down to 2.2--supplements added. 14. H/o NICM/HF s/p ICD:  Daily weights/monitor for signs of overload. Change diet to heart healthy.  No signs of fluid overload -- On Coreg 25mg  BID, aldactone 25mg  QD, Lasix 20mg  BID, and Pravachol 80mg  daily, Zetia 10mg  daily             --decrease Kdur to 20 meq bid as K trending up.  --Repeat chest x-ray in a.m.- no acute findings -10/29 recheck chest x-ray today -wt stable Filed Weights   05/17/23 0519 05/18/23 0632 05/20/23 0546  Weight: 79.5 kg 79.4 kg 79.1 kg   Vitals:   05/17/23 1311 05/17/23 1946 05/18/23 0500 05/18/23 0729  BP: 134/65 (!) 129/57 (!) 107/96 (!) 141/60   05/18/23 0729 05/18/23 1300 05/18/23 1425 05/18/23 1929  BP: (!) 141/60 (!) 138/53 (!) 125/57 (!) 131/59   05/19/23 0447 05/19/23 1447 05/19/23 1958 05/20/23 0443  BP: (!) 144/63 138/70 126/61 (!) 142/65     15. Multiple rib fractures: Encourage pulmonary hygiene. Incentive spirometer ordered  -10/29 patient bringing up more mucus today, may be due to the Mucinex   16. Urinary retention: Monitor voiding with PVR checks.  continue Flomax 0.8mg  daily.  -10/24 Required IC this AM, continue to monitor  PVRs  -Timed voids every 4 hours, consider UA if urinary symptoms worsen -05/16/23 PVRs decreasing, hasn't needed cath since yesterday;  monitor -05/17/23 needed cath last night for and in the afternoon yesterday; defer to weekday team regarding management of this -10/29  improved continence, last PVR was 148, 172.  Continue current regimen with Flomax   17. Constipation. Appears to be having regular Bms.   -Cont miralax prn -05/17/23 last BM this morning but loose, no stool softeners being given, likely from doxycycline, monitor for ongoing diarrhea -10/30 LBM today  LOS: 7 days A FACE TO FACE EVALUATION WAS PERFORMED  Fanny Dance 05/20/2023, 11:10 AM

## 2023-05-20 NOTE — Progress Notes (Signed)
Ortho Trauma Note  Patient seen and examined and I have reviewed the imaging films.  It does appear that he has loosened some of the hardware in the front of his pelvis.  Because of this I feel that we may have been a little bit too aggressive in terms of his mobilization.  I will back off to nonweightbearing bilateral lower extremities.  I would be okay with slide board transfers to the wheelchair however I do not want much aggressive mobilization more than to prevent pulmonary and skin complications including bedsores.  This may preclude him from staying in CIR and he may end up needing a skilled nursing facility.  I did take off his incisional wound VAC which the wound has a much improved appearance.  I placed a dressing on that that we will continue with daily dressing changes.  I called his wife and updated her regarding the imaging findings as well as the change in weightbearing status.  We will follow-up on him again on Friday.  Roby Lofts, MD [facility] [phone] (office) [email or web address]

## 2023-05-20 NOTE — Progress Notes (Signed)
Patient ID: Louis Tinder., male   DOB: 08/22/1940, 82 y.o.   MRN: [id number] Met with pt and spoke wit wife-Louis Juarez via telephone to discuss team conference update. Both have spoken with the surgeon and are aware of the loosening hardware in pt's pelvis and now can only do OD therapies. Option is to go to a SNF or to go home with 24/7 care. Pt has no plans to go to a NH and will be going home. Discussed min transfer wheelchair level goals and discharge date 11/11. Wife will inform son so he can make travel arrangements since he will be coming to assist with Dad's care. Wife plans to take off also for a short time. Wife feels he will be better and heal better at home versus in another facility. Discussed having son and wife come in for hands on education prior to discharge so feel comfortable with pt's care needs. Wife to let worker know when son will be getting here.

## 2023-05-20 NOTE — Progress Notes (Addendum)
Encouraged intake. Patient refused breakfast and lunch. Fluids and snack at bedside. Notified PA. Patient stated he would try to eat some dinner.    Tilden Dome, LPN

## 2023-05-20 NOTE — Plan of Care (Signed)
  Problem: RH KNOWLEDGE DEFICIT GENERAL Goal: RH STG INCREASE KNOWLEDGE OF SELF CARE AFTER HOSPITALIZATION Description: Patient/caregiver will be able to manage medications, self care, weight bearing precautions, and diet/lifestyle modifications to improve health from nursing education, nursing handouts, and other resources independently  Outcome: Progressing   Problem: RH PAIN MANAGEMENT Goal: RH STG PAIN MANAGED AT OR BELOW PT'S PAIN GOAL Description: Less than 4 with PRN medications min assist  Outcome: Progressing   Problem: RH SAFETY Goal: RH STG DECREASED RISK OF FALL WITH ASSISTANCE Description: STG Decreased Risk of Fall With min Assistance. Outcome: Progressing   Problem: RH SAFETY Goal: RH STG ADHERE TO SAFETY PRECAUTIONS W/ASSISTANCE/DEVICE Description: STG Adhere to Safety Precautions With cueing Assistance/Device. Outcome: Progressing   Problem: RH SKIN INTEGRITY Goal: RH STG ABLE TO PERFORM INCISION/WOUND CARE W/ASSISTANCE Description: STG Able To Perform Incision/Wound Care With min Assistance. Outcome: Progressing   Problem: RH SKIN INTEGRITY Goal: RH STG MAINTAIN SKIN INTEGRITY WITH ASSISTANCE Description: STG Maintain Skin Integrity With min Assistance. Outcome: Progressing

## 2023-05-20 NOTE — Progress Notes (Signed)
Occupational Therapy Note  Patient Details  Name: Louis Juarez. MRN: [id number] Date of Birth: [birth year]  Pt's plan of care adjusted to QD after speaking with care team and discussed with MD in team conference as pt currently unable to tolerate current therapy schedule with OT & SLP.    [name], OTR/L, MSOT  05/20/2023, 1:22 PM

## 2023-05-20 NOTE — Progress Notes (Signed)
Occupational Therapy Weekly Progress Note  Patient Details  Name: Louis Juarez. MRN: [id number] Date of Birth: 1940-12-25  Beginning of progress report period: May 14, 2023 End of progress report period: May 20, 2023  Today's Date: 05/20/2023 OT Missed Time: 60 Minutes Missed Time Reason: MD hold (comment)  Today's Date: 05/20/2023 OT Missed Time: 30 Minutes Missed Time Reason: MD hold (comment)  Patient has met 2 of 3 short term goals this reporting period. Although motivation for functional independence is greatly limited by pain, pt is able to complete UB dressing/bathing tasks with A provided for wound vac management. Pt heavily reliant of BUE/LLE for bed-mobility during bed-level LB care. Pt able to thread BLE into LB garments with Min A, requiring closer to Mod A for hiking over bottom/hips. Pt planning to ask spouse for looser fitted clothing to simplify LB dressing. Pt making progress towards squat pivot transfers v SB transfers, caregiver education to be completed closer to DC date.   Patient continues to demonstrate the following deficits: muscle weakness and muscle joint tightness, decreased cardiorespiratoy endurance, decreased awareness, decreased problem solving, decreased safety awareness, and decreased memory, and decreased sitting balance, decreased standing balance, decreased postural control, decreased balance strategies, difficulty maintaining precautions, and increased pain at fracture sites  and therefore will continue to benefit from skilled OT intervention to enhance overall performance with BADL and Reduce care partner burden.  Patient progressing toward long term goals..  Continue plan of care.  OT Short Term Goals Week 1:  OT Short Term Goal 1 (Week 1): Pt will transfer with TB to [facility] with mod A to and from L side OT Short Term Goal 1 - Progress (Week 1): Met OT Short Term Goal 2 (Week 1): Pt will complete LB dressing with AE at EOB with mod a OT  Short Term Goal 2 - Progress (Week 1): Progressing toward goal OT Short Term Goal 3 (Week 1): Pt will complete grooming sinkside seated with S w/c level OT Short Term Goal 3 - Progress (Week 1): Met Week 2:  OT Short Term Goal 1 (Week 2): Pt will perform squat-pivot transfer to BSC/toilet with consistent Min A. OT Short Term Goal 2 (Week 2): Pt will manage LB dressing with consistent Mod A + LRAD. OT Short Term Goal 3 (Week 2): Pt will complete LB bathing with consistent Min A + LRAD.  Skilled Therapeutic Interventions/Progress Updates:   Session 1: Pt missing ~60 mins of skilled intervention due to medical hold.   Session 2: Pt missing ~30 mins of skilled intervention due to medical hold.  Therapy Documentation Precautions:  Precautions Precautions: Fall Restrictions Weight Bearing Restrictions: Yes RUE Weight Bearing: Weight bearing as tolerated RLE Weight Bearing: Non weight bearing LLE Weight Bearing: Weight bearing as tolerated Other Position/Activity Restrictions: LLE WBAT for transfers only   Therapy/Group: Individual Therapy  [name], OTR/L, MSOT  05/20/2023, 6:15 AM

## 2023-05-20 NOTE — Patient Care Conference (Signed)
Inpatient RehabilitationTeam Conference and Plan of Care Update Date: 05/20/2023   Time: 12:21 PM    Patient Name: Louis Juarez.      Medical Record Number: [id number]  Date of Birth: 1940-12-10 Sex: Male         Room/Bed: 4W04C/4W04C-01 Payor Info: Payor: [employer]UE [employer] BLUE [employer]IELD / Plan: BCBS COMM PPO / Product Type: *No Product type* /    Admit Date/Time:  05/13/2023  2:31 PM  Primary Diagnosis:  Trauma  Hospital Problems: Principal Problem:   Trauma Active Problems:   NICM (nonischemic cardiomyopathy) [facility])   Urinary retention    Expected Discharge Date: Expected Discharge Date: 06/01/23  Team Members Present: Physician leading conference: Dr. Fanny Dance Social Worker Present: Dossie Der, LCSW Nurse Present: Chana Bode, RN PT Present: [name], PT OT Present: [name], OT PPS Coordinator present : Fae Pippin, SLP     Current Status/Progress Goal Weekly Team Focus  Bowel/Bladder   Pt is continent, but have some incontinent episodes at times. LBM: 10/28   To encourage timed toileting so that pt could create a habit of voiding on his own.   Assist w/ toileting needs q 2-4 hours & as needed.    Swallow/Nutrition/ Hydration               ADL's   Min A for UB ADLs (assistance for wound vac management), heavy Mod A for LB ADLs, incontinent of B/B but able to performs anterior pericare. Greatly limited by pain, motivation, and cognitive deficits.   Min A with exception of seated UB care   Functional transfers & LB AE as appropriate    Mobility   rolling supervision with use of bed rails, supine to sit, sit to supine min-max A with use of bed features, sit to stand max A +2, slide board transfer mod-max A, WC mobility supervision, squat pivot mod A   supervision/min A  DME: hospital bed-does he qualify for air mattress?, WC, slide board, D/C 11/11, barriers to discharge: 3 steps for home entry, can we verify car? pain, poor activity  tolerance, incontinence.    Communication                Safety/Cognition/ Behavioral Observations               Pain   Pt denies pain at the moment, but from previous documentation pt c/o back pain that ranges at different levels on the 0-10 pain score. Pain medication is given when needed.   To keep the pain at a minimal, preferably < 4 on the pain scale.   Assess pain q shift & PRN.    Skin   Wound to R elbow - wound vac in place. Incision to ABD - skin glue/OTA   Pt's wound to the R elbow will heal in a timely manner w/o complications.  Assess skin q shift & PRN.      Discharge Planning:  Home with wife and son coming from [place] to assist him. Friday wife reports they do not want a ramp at home and will figure out getting pt into home. Pt placed on neuro-psych list to be seen due to depression and frustration with situation   Team Discussion: Patient post multi trauma after a fall; limited by pain. Xray noted loosening of hardware without dislocation. Ortho placed hold on therapy today and changed to q day therapies. Wound vac removed from right elbow.   Patient on target to meet rehab goals:  no, currently needs min assist for upper body care and mod - max assist for lower body care. Goals for discharge set for supervision - min assist.  *See Care Plan and progress notes for long and short-term goals.   Revisions to Treatment Plan:  Therapy schedule revised NWB bilateral lower extremities Air-mattress bed initiated  Teaching Needs: Safety, precautions/weight bearing limitations, medications, skin care/wound care, transfers, toileting, etc.   Current Barriers to Discharge: Decreased caregiver support, Home enviroment access/layout, Incontinence, Wound care, and Weight bearing restrictions  Possible Resolutions to Barriers: Family education SNF recommended HH follow up services DME: wheelchair, hip kit, TTB, drop arm BSC and slide-board with hospital bed Ramp for  entry to home     Medical Summary Current Status: polytrauma, hardware loosening pelvis, NICM, Pain, incontinence, insomnia  Barriers to Discharge: Medical stability;Weight bearing restrictions  Barriers to Discharge Comments: polytrauma, hardware loosening pelvis, NICM, Pain, incontinence, insomnia Possible Resolutions to [employer] Focus: Ortho rec NWB b/l, oxycodone for pain, Abx for Elbow wound,   Continued Need for Acute Rehabilitation Level of Care: The patient requires daily medical management by a physician with specialized training in physical medicine and rehabilitation for the following reasons: Direction of a multidisciplinary physical rehabilitation program to maximize functional independence : Yes Medical management of patient stability for increased activity during participation in an intensive rehabilitation regime.: Yes Analysis of laboratory values and/or radiology reports with any subsequent need for medication adjustment and/or medical intervention. : Yes   I attest that I was present, lead the team conference, and concur with the assessment and plan of the team.   Chana Bode B 05/20/2023, 3:16 PM

## 2023-05-20 NOTE — Progress Notes (Signed)
Physical Therapy Session Note  Patient Details  Name: Louis Juarez. MRN: [id number] Date of Birth: [birth year]  Today's Date: 05/20/2023 PT Missed Time: 75 Minutes, 45 minutes  Missed Time Reason:  MD Hold  Short Term Goals: Week 1:  PT Short Term Goal 1 (Week 1): Pt will complete bed mobility with modA. PT Short Term Goal 2 (Week 1): Pt will complete bed to chair transfer with modA. PT Short Term Goal 3 (Week 1): Pt will accurately recall and maintain WB precautions.  Skilled Therapeutic Interventions/Progress Updates:      Treatment Session 1 and 2  Pt pelvic x-ray shows increased diastisis of the pubic symphysis and lateral R 3 screws have shifted positions. Discussed with nurse and PA. Holding therapy while awaiting ortho consult.  Pt missed 75 minutes first session, pt missed 45 minutes 2nd session. Will attempt to make up as able.     Therapy Documentation Precautions:  Precautions Precautions: Fall Restrictions Weight Bearing Restrictions: Yes RUE Weight Bearing: Weight bearing as tolerated RLE Weight Bearing: Non weight bearing LLE Weight Bearing: Weight bearing as tolerated Other Position/Activity Restrictions: LLE WBAT for transfers only  Therapy/Group: Individual Therapy  [name] 05/20/2023, 7:32 AM

## 2023-05-20 NOTE — Progress Notes (Signed)
Pelvis films reviewed by orthopedic services Dr. Jena Gauss.  At this time patient is now nonweightbearing bilateral lower extremities.  Okay for slide board transfers.  Dr. Jena Gauss will discuss films with wife.

## 2023-05-20 NOTE — Discharge Summary (Signed)
Physician Discharge Summary  Patient ID: Louis Juarez. MRN: [id number] DOB/AGE: [birth year] 82 y.o.  Admit date: 05/13/2023 Discharge date: 06/01/2023  Discharge Diagnoses:  Principal Problem:   Trauma Active Problems:   Chronic systolic heart failure (HCC)   NICM (nonischemic cardiomyopathy) (HCC)   Pelvic fracture (HCC)   Urinary retention   Depressive reaction   Generalized anxiety disorder   Elbow injury, sequela   Discharged Condition: stable  Significant Diagnostic Studies: DG Chest 2 View  Result Date: 05/28/2023 CLINICAL DATA:  Shortness of breath.  Cough. EXAM: CHEST - 2 VIEW COMPARISON:  Chest radiographs 05/19/2023 FINDINGS: An ICD remains in place. The cardiomediastinal silhouette is within limits. There is chronic coarsening of the interstitial markings. No overt pulmonary edema, acute airspace consolidation, pleural effusion, or pneumothorax is identified. No acute osseous abnormality is seen. IMPRESSION: No active cardiopulmonary disease. Electronically Signed   By: Sebastian Ache M.D.   On: 05/28/2023 10:59   DG Swallowing Func-Speech Pathology  Result Date: 05/27/2023 Table formatting from the original result was not included. Modified Barium Swallow Study Patient Details Name: Louis Juarez. MRN: [id number] Date of Birth: 12/25/1940 Today's Date: 05/27/2023 HPI/PMH: HPI: 82 y.o. male presents to [facility] hospital on 04/24/2023 after fall from ladder. Pt found to have open book pelvic fx, open R olceranon fx, R rib 6-12 fx, R T10 TP fx, and age-indeterminate mild compression fxs of T4, T7 and T12. Pt underwent percutaneous fixation of pelvic fxs and open treatment of R olecranon fx on 10/4. PMH includes heart failure, ICD. Clinical Impression: Patient presents with moderate oropharyngeal dysphagia. Oral phase remarkable for reduced lingual control/coordination resulting in posterior spillage of thin liquids x1 to trachea and otherwise to vallecula. Mastication timely and  multiple swallows aided in clearance of trace diffuse oral residuals. Pharyngeal phase is characterized by incomplete epiglottic inversion, reduced laryngeal elevation, incomplete laryngeal vestibule closure and reduced pharyngeal stripping wave resulting in moderate diffuse pharyngeal residuals and penetration/aspiration of various consistencies. Patient with audible aspiration of initial thin liquid trial as swallow initiation delayed to the level of the trachea. Audible aspiration x1 of puree, otherwise consistent penetration of thin liquids, NTL and HTL. Aspiration unable to be completely cleared despite spontaneous coughing. No penetration/aspiration present during trials of D3, though patient requests continuation of D2 diet. Moderate diffuse pharyngeal residuals reduced in amounts after cued multiple effortful swallows. During trial of pill via thin liquids, entire pill lodged in vallecula, however cleared with subsequent trials of puree. Recommend D2/thin liquid diet with intermittent throat clear and multiple swallows. Medications should be administered crushed in puree with strict supervision from nursing. Recommend continuation of standardized swallowing precautions including sitting upright at 90 degrees, small bites and sips and NO STRAWS with thin liquids. Factors that may increase risk of adverse event in presence of aspiration Rubye Oaks & Clearance Coots 2021): Factors that may increase risk of adverse event in presence of aspiration Rubye Oaks & Clearance Coots 2021): Frail or deconditioned; Aspiration of thick, dense, and/or acidic materials Recommendations/Plan: Swallowing Evaluation Recommendations Swallowing Evaluation Recommendations Recommendations: PO diet PO Diet Recommendation: Dysphagia 2 (Finely chopped); Thin liquids (Level 0) Liquid Administration via: Cup; No straw Medication Administration: Crushed with puree Supervision: Intermittent supervision/cueing for swallowing strategies Swallowing strategies  :  Slow rate; Small bites/sips; Minimize environmental distractions; Clear throat intermittently Postural changes: Position pt fully upright for meals Oral care recommendations: Oral care BID (2x/day) Caregiver Recommendations: Have oral suction available Treatment Plan Treatment Plan Treatment recommendations: Therapy as outlined in  treatment plan below Follow-up recommendations: Follow physicians's recommendations for discharge plan and follow up therapies Recommendations Comment: n/a Functional status assessment: Patient has had a recent decline in their functional status and demonstrates the ability to make significant improvements in function in a reasonable and predictable amount of time. Treatment frequency: Min 1x/week Treatment duration: 2 weeks Interventions: Aspiration precaution training; Diet toleration management by SLP; Compensatory techniques Recommendations Recommendations for follow up therapy are one component of a multi-disciplinary discharge planning process, led by the attending physician.  Recommendations may be updated based on patient status, additional functional criteria and insurance authorization. Assessment: Orofacial Exam: Orofacial Exam Oral Cavity - Dentition: Adequate natural dentition Anatomy: Anatomy: WFL Boluses Administered: Boluses Administered Boluses Administered: Thin liquids (Level 0); Mildly thick liquids (Level 2, nectar thick); Moderately thick liquids (Level 3, honey thick); Puree; Solid  Oral Impairment Domain: Oral Impairment Domain Lip Closure: No labial escape Tongue control during bolus hold: Posterior escape of less than half of bolus Bolus preparation/mastication: Timely and efficient chewing and mashing Bolus transport/lingual motion: Brisk tongue motion Oral residue: Trace residue lining oral structures Location of oral residue : Tongue; Palate Initiation of pharyngeal swallow : Valleculae; No visible initiation at any location (no visible initiation during first  sip of thin liquids)  Pharyngeal Impairment Domain: Pharyngeal Impairment Domain Soft palate elevation: No bolus between soft palate (SP)/pharyngeal wall (PW) Laryngeal elevation: Partial superior movement of thyroid cartilage/partial approximation of arytenoids to epiglottic petiole Anterior hyoid excursion: Partial anterior movement Epiglottic movement: Partial inversion Laryngeal vestibule closure: Incomplete, narrow column air/contrast in laryngeal vestibule Pharyngeal stripping wave : Present - diminished Pharyngeal contraction (A/P view only): N/A Pharyngoesophageal segment opening: Partial distention/partial duration, partial obstruction of flow Tongue base retraction: Trace column of contrast or air between tongue base and PPW Pharyngeal residue: Collection of residue within or on pharyngeal structures Location of pharyngeal residue: Diffuse (>3 areas)  Esophageal Impairment Domain: Esophageal Impairment Domain Esophageal clearance upright position: Complete clearance, esophageal coating Pill: Pill Consistency administered: Thin liquids (Level 0); Puree Thin liquids (Level 0): Impaired (see clinical impressions) Puree: WFL Penetration/Aspiration Scale Score: Penetration/Aspiration Scale Score 1.  Material does not enter airway: Solid; Pill 3.  Material enters airway, remains ABOVE vocal cords and not ejected out: Mildly thick liquids (Level 2, nectar thick); Moderately thick liquids (Level 3, honey thick) 7.  Material enters airway, passes BELOW cords and not ejected out despite cough attempt by patient: Thin liquids (Level 0); Puree Compensatory Strategies: Compensatory Strategies Compensatory strategies: Yes Multiple swallows: Effective Effective Multiple Swallows: Puree; Thin liquid (Level 0); Mildly thick liquid (Level 2, nectar thick); Moderately thick liquid (Level 3, honey thick) Chin tuck: -- (patient with natural chin tuck) Other(comment): Effective (throat clear/cough) Effective Other(comment):  Thin liquid (Level 0)   General Information: No data recorded Diet Prior to this Study: Dysphagia 2 (finely chopped); Thin liquids (Level 0)   No data recorded  No data recorded  No data recorded  No data recorded Behavior/Cognition: Alert; Cooperative; Pleasant mood No data recorded Baseline vocal quality/speech: Normal Volitional Cough: Able to elicit Volitional Swallow: Able to elicit Exam Limitations: No limitations Goal Planning: Prognosis for improved oropharyngeal function: Fair Barriers to Reach Goals: Cognitive deficits Barriers/Prognosis Comment: cognition Patient/Family Stated Goal: PO diet Consulted and agree with results and recommendations: Patient Pain: No data recorded End of Session: Start Time:No data recorded Stop Time: No data recorded Time Calculation:No data recorded Charges: No data recorded SLP visit diagnosis: SLP Visit Diagnosis: Dysphagia, unspecified (R13.10)  Past Medical History: Past Medical History: Diagnosis Date  AICD (automatic cardioverter/defibrillator) present   Anxiety   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 Bilateral pleural effusions,  Question mild subpleural reticulation, indicative of fibrosis.  Coronary artery calcification. 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 Wall thickness was increased in a pattern of mild LVH.- Aortic valve: AV is thckened, calcified with no signifi stenosis.The atrium was severely dilated.: Systolic function was moderately reduced.The atrium was mildly dilated.PA peak pre  Nonischemic cardiomyopathy (HCC)   Palpitations   Pneumonia   PVC (premature ventricular contraction) 07/02/2011  PVC's (premature ventricular contractions)   Wheezing 04/29/2011  Sinus ct 05/2011:   Wheezing  Past Surgical History: Past Surgical History: Procedure Laterality Date  BI-VENTRICULAR IMPLANTABLE CARDIOVERTER DEFIBRILLATOR N/A 02/13/2012  Procedure: BI-VENTRICULAR IMPLANTABLE CARDIOVERTER DEFIBRILLATOR  (CRT-D);  Surgeon: Hillis Range, MD;  Location: [facility] C[facility] LAB;  Service: Cardiovascular;  Laterality: N/A;  BI-VENTRICULAR IMPLANTABLE CARDIOVERTER DEFIBRILLATOR  (CRT-D)  02/13/2012  BI-VENTRICULAR IMPLANTABLE CARDIOVERTER DEFIBRILLATOR  (CRT-D)  05/03/2019  CHANGEOUT  BIV ICD GENERATOR CHANGEOUT N/A 05/03/2019  Procedure: BIV ICD GENERATOR CHANGEOUT;  Surgeon: Hillis Range, MD;  Location[facility] INVASIVE CV LAB;  Service: Cardiovascular;  Laterality: N/A;  CARDIAC CATHETERIZATION  2013  CARDIAC DEFIBRILLATOR PLACEMENT  02/13/2012  SJM [employer] BIV ICD implanted by Dr Johney Frame  IRRIGATION AND DEBRIDEMENT ELBOW Right 05/08/2023  Procedure: IRRIGATION AND DEBRIDEMENT ELBOW;  Surgeon: Roby Lofts, MD;  Location: [facility] OR;  Service: Orthopedics;  Laterality: Right;  IRRIGATION AND DEBRIDEMENT ELBOW Right 04/24/2023  Procedure: IRRIGATION AND DEBRIDEMENT ELBOW;  Surgeon: Roby Lofts, MD;  Location: [facility] OR;  Service: Orthopedics;  Laterality: Right;   NASAL SINUS SURGERY  93, 97, 2010, 2006  NASAL SINUS SURGERY    1993, 1997, 2006, 2010  ORIF PELVIC FRACTURE Right 05/08/2023  Procedure: OPEN REDUCTION INTERNAL FIXATION (ORIF) PELVIC FRACTURE;  Surgeon: Roby Lofts, MD;  Location: [facility] OR;  Service: Orthopedics;  Laterality: Right;  ORIF PELVIC FRACTURE WITH PERCUTANEOUS SCREWS Bilateral 04/24/2023  Procedure: ORIF PELVIC FRACTURE WITH PERCUTANEOUS SCREWS;  Surgeon: Roby Lofts, MD;  Location: [facility] OR;  Service: Orthopedics;  Laterality: Bilateral; Cassidi Sockwell M.A., CF-SLP 05/27/2023, 12:51 PM  DG Pelvis Comp Min 3V  Result Date: 05/25/2023 CLINICAL DATA:  Postop pain. EXAM: JUDET PELVIS - 3+ VIEW COMPARISON:  Pelvic radiograph dated 05/19/2023. FINDINGS: Extensive postoperative changes of the pelvis with fixation plate and screws as seen previously. The hardware is intact. No acute fracture or dislocation. The bones are  osteopenic. 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 Degenerative changes of the visualized lower lumbar spine. The soft tissues are unremarkable. IMPRESSION: 1. No acute fracture or dislocation. 2. Stable postoperative changes of the pelvis with stable diastasis of the symphysis pubis. Electronically Signed   By: Elgie Collard M.D.   On: 05/25/2023 18:53   DG Chest 2 View  Result Date: 05/19/2023 CLINICAL DATA:  Pain EXAM: CHEST - 2 VIEW COMPARISON:  Chest x-ray 05/14/2023 FINDINGS: Left-sided ICD is again noted. The heart is mildly enlarged. There central pulmonary vascular congestion. There is no focal lung consolidation, pleural effusion or pneumothorax. No acute fractures are seen. IMPRESSION: Mild cardiomegaly with central pulmonary vascular congestion. Electronically Signed   By: Darliss Cheney M.D.   On: 05/19/2023 20:52   DG Pelvis Comp Min 3V  Result Date: 05/19/2023 CLINICAL DATA:  Pain in the pelvis EXAM: JUDET PELVIS - 3+ VIEW COMPARISON:  Pelvic x-ray 05/08/2023 FINDINGS: Multiple  plates and screws are seen in the region of the bilateral sacroiliac joints, right acetabulum and along the pubic symphysis. Right inferior pubic ramus fracture is again seen. There is diastasis of the pubic symphysis measuring 1.8 cm (previously 0.6 cm). The fixation plate crossing the pubic symphysis appears stable in position in the left superior pubic ramus, but the lateral right 3 screws have shifted positions in the interval. There is no dislocation. IMPRESSION: 1. Increased diastasis of the pubic symphysis measuring 1.8 cm (previously 0.6 cm). 2. The fixation plate crossing the pubic symphysis appears stable in position in the left superior pubic ramus, but the lateral right 3 screws have shifted positions in the interval. Please correlate clinically. Electronically Signed   By: Darliss Cheney M.D.   On: 05/19/2023 20:48   DG Chest 2 View  Result Date: 05/14/2023 CLINICAL DATA:  Wheezing. EXAM: CHEST - 2 VIEW COMPARISON:  05/10/2023 FINDINGS: The lungs are clear without focal pneumonia, edema, pneumothorax or pleural effusion. Interstitial markings are diffusely coarsened with chronic features. The cardiopericardial silhouette is within normal limits for size. No acute bony abnormality. Left-sided permanent pacemaker again noted. IMPRESSION: Chronic interstitial coarsening without acute cardiopulmonary findings. Electronically Signed   By: Kennith Center M.D.   On: 05/14/2023 10:21   DG CHEST PORT 1 VIEW  Result Date: 05/10/2023 CLINICAL DATA:  Pulmonary edema. Chest pain. Congestive heart failure. EXAM: PORTABLE CHEST 1 VIEW COMPARISON:  One-view chest x-ray 05/07/2023 FINDINGS: Pacing wires are stable. The heart size is normal. Atherosclerotic calcifications are present. Lung volumes remain low. Aeration is slightly improved. The right pleural effusion is improving. No pneumothorax is present. IMPRESSION: 1. Improving right pleural effusion. 2. Slight improved aeration. Electronically Signed   By:  Marin Roberts M.D.   On: 05/10/2023 10:32   DG Pelvis Comp Min 3V  Result Date: 05/08/2023 CLINICAL DATA:  Postop. EXAM: JUDET PELVIS - 3+ VIEW COMPARISON:  Radiograph 05/03/2021 FINDINGS: New plate and screw fixation of the pubic symphysis with decreased symphyseal diastasis from prior exam. There is new plate and screw fixation of the right iliac fracture and sacroiliac joint with decreased fracture displacement. Screws traversing the right pelvis, sacroiliac joints and right superior pubic ramus again seen. Right superior and inferior pubic rami fractures are unchanged. Sacral fractures are not well delineated on the current exam. IMPRESSION: 1. New plate and screw fixation of the pubic symphysis with decreased symphyseal diastasis. 2. New plate and screw fixation of the right iliac fracture and sacroiliac joint with  decreased fracture displacement. 3. The right pubic rami fractures are unchanged. Known sacral fractures are not well delineated by radiograph. Electronically Signed   By: Narda Rutherford M.D.   On: 05/08/2023 22:17   CT PELVIS WO CONTRAST  Result Date: 05/08/2023 CLINICAL DATA:  Pelvic fracture, postop. EXAM: CT PELVIS WITHOUT CONTRAST TECHNIQUE: Multidetector CT imaging of the pelvis was performed following the standard protocol without intravenous contrast. RADIATION DOSE REDUCTION: This exam was performed according to the departmental dose-optimization program which includes automated exposure control, adjustment of the mA and/or kV according to patient size and/or use of iterative reconstruction technique. COMPARISON:  Abdominopelvic CT 05/02/2023 FINDINGS: Urinary Tract: Urinary bladder is decompressed by Foley catheter. Circumferential bladder wall thickening may be due to nondistention. Bowel: Enteric sutures in the distal sigmoid colon. No bowel wall thickening or inflammation. Moderate volume of colonic stool. Vascular/Lymphatic: Aortic and branch atherosclerosis. No  pelvic adenopathy. Reproductive:  Nonacute. Other:  Stranding in the pelvis related to fracture fixation. Musculoskeletal: New plate and screw fixation of the pubic symphysis, decreased symphyseal widening from prior exam. Screw again traverses the right superior pubic ramus fracture. Persistent ramus fracture displacement, without significant interval change from prior CT. Comminuted and displaced right inferior pubic ramus fracture is unchanged. Screw traverses the comminuted right iliac fracture. There is new plate and screw fixation of the right sacroiliac joint. Unchanged or slightly diminished fracture displacement from prior CT. Screw traversing both sacroiliac joints is again seen. Complex sacral fracture is without significant change in alignment. There is recent postsurgical change in the adjacent musculature and subcutaneous tissues with strandy density and air. IMPRESSION: 1. New plate and screw fixation of the pubic symphysis, decreased symphyseal widening from prior CT. 2. New plate and screw fixation of the right sacroiliac joint. Unchanged or slightly diminished fracture displacement from prior CT. 3. Unchanged right superior and inferior pubic rami fractures. 4. Complex sacral fracture is without significant change in alignment. Electronically Signed   By: Narda Rutherford M.D.   On: 05/08/2023 22:15   DG Pelvis Comp Min 3V  Result Date: 05/08/2023 CLINICAL DATA:  ORIF pelvic fractures. EXAM: JUDET PELVIS- 11 VIEW COMPARISON:  Pelvis radiographs 05/04/2023 FLUOROSCOPY: Exposure Index (as provided by the fluoroscopic device): 13.29 MGy Kerma FINDINGS: Malleable plate screw fixation used to close the pubic symphysis diastasis. Microplate and screw fixation is used to reduce the comminuted fracture of the right iliac bone. IMPRESSION: ORIF of comminuted right iliac bone fracture and pubic symphysis diastasis without radiographic evidence for complication. Electronically Signed   By: Marin Roberts M.D.   On: 05/08/2023 15:07   DG C-Arm 1-60 Min-No Report  Result Date: 05/08/2023 Fluoroscopy was utilized by the requesting physician.  No radiographic interpretation.   DG C-Arm 1-60 Min-No Report  Result Date: 05/08/2023 Fluoroscopy was utilized by the requesting physician.  No radiographic interpretation.   DG CHEST PORT 1 VIEW  Result Date: 05/07/2023 CLINICAL DATA:  Hypoxia.  History of CHF. EXAM: PORTABLE CHEST 1 VIEW COMPARISON:  Radiograph 05/05/2023. CT 04/24/2023 FINDINGS: Left-sided pacemaker in place. Mild cardiomegaly with stable mediastinal contours. Aortic atherosclerosis. Right pleural effusion may have worsened. Background peribronchial thickening. No developing airspace disease. No pneumothorax. Bilateral rib fractures, acute on the right, remote on the left. IMPRESSION: 1. Right pleural effusion may have worsened. 2. Mild cardiomegaly. 3. Background peribronchial thickening. Electronically Signed   By: Narda Rutherford M.D.   On: 05/07/2023 10:15   DG Chest Port 1 View  Result Date: 05/05/2023  CLINICAL DATA:  Respiratory failure.  Shortness of breath. EXAM: PORTABLE CHEST 1 VIEW COMPARISON:  05/04/2023 FINDINGS: Pacemaker/AICD remains in place. Heart size is normal. There is aortic atherosclerosis. Left lung is clear except for some mildly prominent chronic interstitial markings. Asymmetric density in the right lower lung relates to a pleural effusion with dependent atelectasis, better shown by the CT scan of 3 days ago. No worsening or new finding. IMPRESSION: Persistent right pleural effusion with dependent atelectasis. No worsening or new finding. Electronically Signed   By: Paulina Fusi M.D.   On: 05/05/2023 10:12   DG Pelvis Comp Min 3V  Result Date: 05/04/2023 CLINICAL DATA:  [id number] Pelvic fracture (HCC) [id number] EXAM: JUDET PELVIS - 3+ VIEW COMPARISON:  04/25/2023 FINDINGS: Compared to the previous radiograph from 04/24/2023, there is increased displacement  of the medial right iliac fracture. The washer associated with the SI joint fusion screw has medially migrated by 1 cm. A hardware is otherwise intact. Increased pubic diastasis now measuring approximately 3.2 cm (previously 2.4 cm). Right-sided pubic rami fractures remain moderately displaced. No new fractures. Hip joint alignment maintained without dislocation. IMPRESSION: 1. Increased displacement of the medial right iliac fracture. 2. Increased pubic diastasis now measuring approximately 3.2 cm (previously 2.4 cm). 3. Right-sided pubic rami fractures remain moderately displaced. Electronically Signed   By: Duanne Guess D.O.   On: 05/04/2023 18:47   DG CHEST PORT 1 VIEW  Result Date: 05/04/2023 CLINICAL DATA:  Right-sided pleural effusion. EXAM: PORTABLE CHEST 1 VIEW COMPARISON:  Radiograph 05/01/2023. CT 04/24/2023, lung bases from abdominopelvic CT 05/02/2023 FINDINGS: Left-sided pacemaker in place. Hazy opacity at the right lung base likely corresponds to effusion on recent abdominopelvic CT. Slight bronchovascular crowding without frank pulmonary edema. The heart is normal in size. No pneumothorax. Known rib fractures are not well demonstrated. IMPRESSION: Hazy opacity at the right lung base likely corresponds to effusion on recent abdominopelvic CT. Electronically Signed   By: Narda Rutherford M.D.   On: 05/04/2023 09:32   CT ABDOMEN PELVIS W CONTRAST  Result Date: 05/02/2023 CLINICAL DATA:  Abdominal pain.  Increasing white count.  Hematuria. EXAM: CT ABDOMEN AND PELVIS WITH CONTRAST TECHNIQUE: Multidetector CT imaging of the abdomen and pelvis was performed using the standard protocol following bolus administration of intravenous contrast. RADIATION DOSE REDUCTION: This exam was performed according to the departmental dose-optimization program which includes automated exposure control, adjustment of the mA and/or kV according to patient size and/or use of iterative reconstruction technique.  CONTRAST:  75mL OMNIPAQUE IOHEXOL 350 MG/ML SOLN COMPARISON:  CT of the abdomen and pelvis 04/25/2023 FINDINGS: Lower chest: A moderate right pleural effusion has increased. Areas of ground-glass attenuation are present in the lower lobes bilaterally. More patchy consolidation is present at the right base. The airways are patent. The heart size is normal. Pacing wires are in place. Hepatobiliary: A hemangioma of the left lobe of the liver measures 2.7 cm on image 25 of series 3. No other focal hepatic lesions are present. The common bile duct and gallbladder are normal. Pancreas: Unremarkable. No pancreatic ductal dilatation or surrounding inflammatory changes. Spleen: Normal in size without focal abnormality. Adrenals/Urinary Tract: The adrenal glands are normal bilaterally. A simple cyst near the lower pole the left kidney measures 14 mm. No other focal lesions are present. No stone or mass lesion is present. No obstruction is present. The ureters are within normal limits bilaterally. A Foley catheter is present within the urinary bladder. Diffuse bladder wall thickening is present.  No discrete lesion is present. Stomach/Bowel: The stomach and duodenum are within normal limits. Small bowel is unremarkable. Terminal ileum is within normal limits. The appendix is visualized and normal. The ascending and transverse colon are within normal limits. Wall thickening inflammatory changes are present about the descending and proximal sigmoid colon. A sigmoid anastomosis is intact. Distal sigmoid and rectum are normal. Vascular/Lymphatic: Atherosclerotic calcifications are present in the aorta and branch vessels. No aneurysm is present. Reproductive: Prostate is unremarkable. Other: No abdominal wall hernia or abnormality. No abdominopelvic ascites. Subcutaneous edema is present diffusely, right greater than left. No discrete fluid collections or abscess is present. Inflammatory changes are present about fractures.  Musculoskeletal: The right iliac wing fracture demonstrates increased displacement compared to the prior exam. There is now a gap across the lag screw entering anteriorly on the right. The comminuted left sacral ala fracture is stable. Fracture through the posterior elements of the sacrum on the right is stable. The displaced right superior pubic ramus fracture is stable. Comminuted inferior right pubic mid ramus fracture is stable. No new fractures are present. IMPRESSION: 1. Wall thickening and inflammatory changes about the descending and proximal sigmoid colon compatible with acute colitis. 2. Increased displacement of the right iliac wing fracture. There is now a gap across the lag screw entering anteriorly on the right. 3. Stable fractures of the left sacral ala, posterior elements of the right sacrum, right superior pubic ramus, and inferior right pubic mid ramus. 4. Increased moderate right pleural effusion. 5. Areas of ground-glass attenuation in the lower lobes bilaterally. More patchy consolidation is present at the right base. This may represent atelectasis or infection. 6. Subcutaneous edema is present diffusely, right greater than left. 7. 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 8. Simple cyst near the lower pole of the left kidney. 9.  Aortic Atherosclerosis (ICD10-I70.0). Electronically Signed   By: Marin Roberts M.D.   On: 05/02/2023 15:06    Labs:  Basic Metabolic Panel:    Latest Ref Rng & Units 06/01/2023    5:17 AM 05/28/2023    6:10 AM 05/25/2023    5:10 AM  BMP  Glucose 70 - 99 mg/dL 161  096  045   BUN 8 - 23 mg/dL 13  19  18    Creatinine 0.61 - 1.24 mg/dL 4.09  8.11  9.14   Sodium 135 - 145 mmol/L 134  136  137   Potassium 3.5 - 5.1 mmol/L 3.8  4.2  3.9   Chloride 98 - 111 mmol/L 98  98  101   CO2 22 - 32 mmol/L 29  27  24    Calcium 8.9 - 10.3 mg/dL 9.0  9.3  9.2      CBC:    Latest Ref Rng & Units 06/01/2023    5:17 AM 05/28/2023    6:10 AM 05/25/2023     5:10 AM  CBC  WBC 4.0 - 10.5 K/uL 9.6  11.1  9.2   Hemoglobin 13.0 - 17.0 g/dL 78.2  95.6  21.3   Hematocrit 39.0 - 52.0 % 37.9  41.4  36.3   Platelets 150 - 400 K/uL 285  290  338      CBG: No results for input(s): "GLUCAP" in the last 168 hours.  Brief HPI:   Louis Pinta. is a 82 y.o. male ***   Hospital Course: Louis Juarez. was admitted to rehab 05/13/2023 for inpatient therapies to consist of PT, ST  and OT at least three hours five days a week. Past admission physiatrist, therapy team and rehab RN have worked together to provide customized collaborative inpatient rehab.   Blood pressures were monitored on TID basis and     Rehab course: During patient's stay in rehab weekly team conferences were held to monitor patient's progress, set goals and discuss barriers to discharge. At admission, patient required max assist with ADL tasks and with mobility. He  has had improvement in activity tolerance, balance, postural control as well as ability to compensate for deficits.  He requires min to mod assist for ADL tasks. He requires cues to  maintain NWB RLE and has been limited by pain, decreased in memory and poor safety awareness.          Diet: Regular  Special Instruction No weight on legs--use sliding board for transfers   Disposition:      Allergies as of 06/01/2023       Reactions   Morphine Other (See Comments)   Agitation/hallucinations        Medication List     STOP taking these medications    amitriptyline 100 MG tablet Commonly known as: ELAVIL   aspirin 81 MG tablet   fluorouracil 5 % cream Commonly known as: EFUDEX   ketoconazole 2 % cream Commonly known as: NIZORAL   losartan 100 MG tablet Commonly known as: COZAAR   mirabegron ER 50 MG Tb24 tablet Commonly known as: MYRBETRIQ   Welchol 625 MG tablet Generic drug: colesevelam   zolpidem 10 MG tablet Commonly known as: AMBIEN       TAKE these medications     acetaminophen 325 MG tablet Commonly known as: TYLENOL Take 2 tablets (650 mg total) by mouth every 6 (six) hours.   albuterol 108 (90 Base) MCG/ACT inhaler Commonly known as: VENTOLIN HFA INHALE 2 PUFFS INTO THE LUNGS EVERY 6 HOURS What changed: Another medication with the same name was removed. Continue taking this medication, and follow the directions you see here.   ascorbic acid 500 MG tablet Commonly known as: VITAMIN C Take 1 tablet (500 mg total) by mouth daily.   carvedilol 25 MG tablet Commonly known as: COREG Take 1 tablet (25 mg total) by mouth 2 (two) times daily with a meal. What changed: Another medication with the same name was removed. Continue taking this medication, and follow the directions you see here.   Eliquis 2.5 MG Tabs tablet Generic drug: apixaban Take 1 tablet (2.5 mg total) by mouth 2 (two) times daily.   ezetimibe 10 MG tablet Commonly known as: ZETIA Take 1 tablet (10 mg total) by mouth at bedtime. What changed: Another medication with the same name was removed. Continue taking this medication, and follow the directions you see here.   furosemide 20 MG tablet Commonly known as: LASIX Take 1 tablet (20 mg total) by mouth 2 (two) times daily. What changed: Another medication with the same name was removed. Continue taking this medication, and follow the directions you see here.   gabapentin 300 MG capsule Commonly known as: NEURONTIN Take 1 capsule (300 mg total) by mouth 3 (three) times daily.   levocetirizine 5 MG tablet Commonly known as: XYZAL SMARTSIG:1 Tablet(s) By Mouth Every Evening What changed: Another medication with the same name was removed. Continue taking this medication, and follow the directions you see here.   LORazepam 1 MG tablet Commonly known as: ATIVAN Take 1 tablet (1 mg total) by mouth at bedtime. What  changed:  when to take this Another medication with the same name was removed. Continue taking this medication,  and follow the directions you see here.   melatonin 5 MG Tabs Take 2 tablets (10 mg total) by mouth at bedtime.   methocarbamol 750 MG tablet Commonly known as: ROBAXIN Take 1 tablet (750 mg total) by mouth every 8 (eight) hours.   multivitamin capsule Take 1 capsule by mouth daily.   Oxycodone HCl 10 MG Tabs Take 1 tablet (10 mg total) by mouth every 4 (four) hours as needed (pain level 7 or higher). Notes to patient: LIMIT TO 2 PILLS PER DAY AS NEEDED. START CUTTING DOWN TO 1/P PILL THREE TIMES A DAY AS NEEDED ON WEDNESDAY AND CONTINUE TO REDUCE EVERY 3-5 DAYS. REFILLS WILL NEED TO COME FROM DR. HADDIX    pantoprazole 40 MG tablet Commonly known as: PROTONIX Take 1 tablet (40 mg total) by mouth daily. What changed:  medication strength how much to take   polyethylene glycol powder 17 GM/SCOOP powder Commonly known as: GLYCOLAX/MIRALAX Mix as directed and take 1 capful ( 17 g) by mouth 2 (two) times daily.   potassium chloride SA 20 MEQ tablet Commonly known as: KLOR-CON M Take 1 tablet (20 mEq total) by mouth 2 (two) times daily.   pravastatin 80 MG tablet Commonly known as: PRAVACHOL Take 80 mg by mouth at bedtime. What changed: Another medication with the same name was removed. Continue taking this medication, and follow the directions you see here.   QUEtiapine 100 MG tablet Commonly known as: SEROQUEL Take 1 tablet (100 mg total) by mouth at bedtime.   sertraline 25 MG tablet Commonly known as: ZOLOFT Take 1 tablet (25 mg total) by mouth daily.   spironolactone 25 MG tablet Commonly known as: ALDACTONE Take 1 tablet (25 mg total) by mouth daily. What changed: Another medication with the same name was removed. Continue taking this medication, and follow the directions you see here.   tamsulosin 0.4 MG Caps capsule Commonly known as: FLOMAX Take 1 capsule (0.4 mg total) by mouth daily after supper. What changed:  when to take this Another medication with the  same name was removed. Continue taking this medication, and follow the directions you see here.   vitamin D3 25 MCG tablet Commonly known as: CHOLECALCIFEROL Take 2 tablets (2,000 Units total) by mouth daily.   Zinc Sulfate 220 (50 Zn) MG Tabs Take 1 tablet (220 mg total) by mouth daily after supper.              Signed: Jacquelynn Cree 06/01/2023, 10:08 AM

## 2023-05-21 DIAGNOSIS — M009 Pyogenic arthritis, unspecified: Secondary | ICD-10-CM | POA: Diagnosis not present

## 2023-05-21 DIAGNOSIS — T1490XA Injury, unspecified, initial encounter: Secondary | ICD-10-CM | POA: Diagnosis not present

## 2023-05-21 DIAGNOSIS — K5900 Constipation, unspecified: Secondary | ICD-10-CM | POA: Diagnosis not present

## 2023-05-21 DIAGNOSIS — S32810G Multiple fractures of pelvis with stable disruption of pelvic ring, subsequent encounter for fracture with delayed healing: Secondary | ICD-10-CM

## 2023-05-21 DIAGNOSIS — I428 Other cardiomyopathies: Secondary | ICD-10-CM | POA: Diagnosis not present

## 2023-05-21 LAB — CBC
HCT: 32.7 % — ABNORMAL LOW (ref 39.0–52.0)
Hemoglobin: 10.1 g/dL — ABNORMAL LOW (ref 13.0–17.0)
MCH: 30.9 pg (ref 26.0–34.0)
MCHC: 30.9 g/dL (ref 30.0–36.0)
MCV: 100 fL (ref 80.0–100.0)
Platelets: 371 10*3/uL (ref 150–400)
RBC: 3.27 MIL/uL — ABNORMAL LOW (ref 4.22–5.81)
RDW: 17 % — ABNORMAL HIGH (ref 11.5–15.5)
WBC: 10.9 10*3/uL — ABNORMAL HIGH (ref 4.0–10.5)
nRBC: 0 % (ref 0.0–0.2)

## 2023-05-21 LAB — BASIC METABOLIC PANEL
Anion gap: 6 (ref 5–15)
BUN: 18 mg/dL (ref 8–23)
CO2: 27 mmol/L (ref 22–32)
Calcium: 8.9 mg/dL (ref 8.9–10.3)
Chloride: 101 mmol/L (ref 98–111)
Creatinine, Ser: 0.63 mg/dL (ref 0.61–1.24)
GFR, Estimated: >60 mL/min (ref >60)
Glucose, Bld: 141 mg/dL — ABNORMAL HIGH (ref 70–99)
Potassium: 4.1 mmol/L (ref 3.5–5.1)
Sodium: 134 mmol/L — ABNORMAL LOW (ref 135–145)

## 2023-05-21 MED ORDER — QUETIAPINE FUMARATE 50 MG PO TABS
100.0000 mg | ORAL_TABLET | Freq: Every evening | ORAL | Status: DC | PRN
  Administered 2023-05-21 – 2023-05-24 (×3): 100 mg via ORAL
  Filled 2023-05-21 (×3): qty 2

## 2023-05-21 NOTE — Progress Notes (Signed)
Occupational Therapy Session Note  Patient Details  Name: Louis Juarez. MRN: [id number] Date of Birth: Sep 02, 1940  Today's Date: 05/21/2023 OT Individual Time: 3244-0102 OT Individual Time Calculation (min): 43 min    Short Term Goals: Week 2:  OT Short Term Goal 1 (Week 2): Pt will perform squat-pivot transfer to BSC/toilet with consistent Min A. OT Short Term Goal 2 (Week 2): Pt will manage LB dressing with consistent Mod A + LRAD. OT Short Term Goal 3 (Week 2): Pt will complete LB bathing with consistent Min A + LRAD.  Skilled Therapeutic Interventions/Progress Updates:   Pt up in w/c upon OT arrival. Sink side hair washing with set up using warm shampoo cap and hair combing completed. + 2 TT assist arrived and pt requested back to bed due to pain. Pt used TB to L side with min A x 1 and mod-max A x 1 with support for lateral leans and board placement due to new B NWB status. Use of bed features to mobilize from sit to supine and pull up to [facility]. Once positioned, pt able to complete 3 sets of 10 reps of yellow tband for triceps press to allow improved lateral board transfer push ups. Pt left bed level with all needs, nurse call button and safety measures in place.  Pain: 7/10 up in w/c in pelvis, back to bed with repositioning with 4/10 relief   Therapy Documentation Precautions:  Precautions Precautions: Fall Restrictions Weight Bearing Restrictions: Yes RUE Weight Bearing: Weight bearing as tolerated RLE Weight Bearing: Non weight bearing LLE Weight Bearing: Non weight bearing Other Position/Activity Restrictions: LLE WBAT for transfers only    Therapy/Group: Individual Therapy  Vicenta Dunning 05/21/2023, 7:54 AM

## 2023-05-21 NOTE — Evaluation (Signed)
Recreational Therapy Assessment and Plan  Patient Details  Name: Louis Juarez. 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 Bilateral pleural effusions,  Question mild subpleural reticulation, indicative of fibrosis.  Coronary artery calcification. 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 Wall thickness was increased in a pattern of mild LVH.- Aortic valve: AV is thckened, calcified with no signifi stenosis.The atrium was severely dilated.: Systolic function was moderately reduced.The atrium  was mildly dilated.PA peak pre   Nonischemic cardiomyopathy (HCC)     Palpitations     Pneumonia     PVC (premature ventricular contraction) 07/02/2011   PVC's (premature ventricular contractions)     Wheezing 04/29/2011    Sinus ct 05/2011:    Wheezing          Past Surgical History:       Past Surgical History:  Procedure Laterality Date   BI-VENTRICULAR IMPLANTABLE CARDIOVERTER DEFIBRILLATOR N/A 02/13/2012    Procedure: BI-VENTRICULAR IMPLANTABLE CARDIOVERTER DEFIBRILLATOR  (CRT-D);  Surgeon: Hillis Range, MD;  Location: [facility] CATH LAB;  Service: Cardiovascular;  Laterality: N/A;   BI-VENTRICULAR IMPLANTABLE CARDIOVERTER DEFIBRILLATOR  (CRT-D)   02/13/2012   BI-VENTRICULAR IMPLANTABLE CARDIOVERTER DEFIBRILLATOR  (CRT-D)   05/03/2019    CHANGEOUT   BIV ICD GENERATOR CHANGEOUT N/A 05/03/2019    Procedure: BIV ICD GENERATOR CHANGEOUT;  Surgeon: Hillis Range, MD;  Location: [facility] INVASIVE CV LAB;  Service: Cardiovascular;  Laterality: N/A;   CARDIAC CATHETERIZATION   2013   CARDIAC DEFIBRILLATOR PLACEMENT   02/13/2012    S[facility] [employer] BIV ICD implanted by Dr Johney Frame   IRRIGATION AND DEBRIDEMENT ELBOW Right 04/24/2023    Procedure: IRRIGATION AND DEBRIDEMENT ELBOW;  Surgeon: Roby Lofts, MD;  Location: [facility] OR;  Service: Orthopedics;  Laterality: Right;   IRRIGATION AND DEBRIDEMENT ELBOW Right 05/08/2023    Procedure: IRRIGATION AND DEBRIDEMENT ELBOW;  Surgeon:  Haddix, Gillie Manners, MD;  Location: [facility] OR;  Service: Orthopedics;  Laterality: Right;   NASAL SINUS SURGERY   93, 97, 2010, 2006   NASAL SINUS SURGERY        1993, 1997, 2006, 2010   ORIF PELVIC FRACTURE Right 05/08/2023    Procedure: OPEN REDUCTION INTERNAL FIXATION (ORIF) PELVIC FRACTURE;  Surgeon: Roby Lofts, MD;  Location: [facility] OR;  Service: Orthopedics;  Laterality: Right;   ORIF PELVIC FRACTURE WITH PERCUTANEOUS SCREWS Bilateral 04/24/2023    Procedure: ORIF PELVIC FRACTURE WITH PERCUTANEOUS SCREWS;  Surgeon: Roby Lofts,  MD;  Location: [facility] OR;  Service: Orthopedics;  Laterality: Bilateral;          Assessment & Plan Clinical Impression: Warnell Mizer is an 82 year old male with history of T2DM, NICM, HTN, ICM/CHF s/p AICD who was admitted on 04/24/23 after falling 20 feed off a ladder with complaints of right elbow, right hip pain and was hypotensive in ED. He was found to have unstable pelvic ring fracture with sacral Fx on left, posterior ilium and superior pubic rami fracture on right, right elbow laceration with air in ulnohumeral joint and small comminuted fracture fragments of olecranon tip, right 6-12 rib fractures, right T10 transverse process fracture, hemorrhagic shock, age-indeterminate mild compression fractures of T4, T7 and T12 as well as incidental findings of pulmonary nodules and severe atherosclerotic calcification left carotid bulb and proximal left cervical ICA.  Dr. Dutch Quint evaluated the films and felt that no brace or spine precautions needed.Marland Kitchen  He was taken to OR the same day for percutaneous fixation of right posterior pelvic fractur fracture, left sacrum and right superior pubic rami fracture, open treatment of right olecranon avulsion fracture, I&D right open olecranon fracture and I&D of her right open elbow arthrotomy by Dr. Jena Gauss.  Postop had issues with hypotension, acute blood loss anemia requiring transfusion, urinary retention requiring Foley placement briefly as well as development of hypoxia with decrease in level of consciousness on 10/07.  Head CT was negative for acute changes albumin bolus for hypotension as well as Levophed.Marland Kitchen   He reported worsening of back pain and flank pain bilaterally as well as hematuria and foley replaced.  CT abdomen pelvis repeated on 10/12 due to rising white count as well as abdominal pain and hematuria.  This revealed thickening of descending and proximal sigmoid colon compatible with acute colitis, increased displacement of right iliac wing fracture,  increased moderate right pleural effusions as well as groundglass opacities BLE and patchy consolidation in right base.  Incidental note made of 2.7 cm hemangioma left lobe of liver as well as subcutaneous edema diffusely right greater than left.  He was started on IV Zosyn on 10/13 for possible colitis as well as pulmonary toilet.  Fluid overload treated with Lasix. He was taken back to OR for ORIF pubic symphysis with right superior pubic rami's, ORIF fixation of right posterior pelvic/SI joint and I&D of right elbow infection on 10/18.  Postop to be strict NWB RLE and WBAT LLE for transfers only.  He is WBAT RUE.   Pt presents with decreased activity tolerance, decreased functional mobility, decreased balance, decreased coordination, decreased memory and delayed processing, .feelings of stress, difficulty maintaining precautions Limiting pt's independence with leisure/community pursuits.  Met with pt today to discuss TR services including leisure education, activity analysis/modifications and stress management.  Also discussed the importance of social, emotional, spiritual health in addition to physical health and their effects on overall  health and wellness.  Pt stated understanding.  Plan  No further TR at this time.  Will continue to monitor.  Recommendations for other services: None   Discharge Criteria: Patient will be discharged from TR if patient refuses treatment 3 consecutive times without medical reason.  If treatment goals not met, if there is a change in medical status, if patient makes no progress towards goals or if patient is discharged from hospital.  The above assessment, treatment plan, treatment alternatives and goals were discussed and mutually agreed upon: by patient  Kennth Vanbenschoten 05/21/2023, 3:51 PM
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
## 2023-05-21 NOTE — Plan of Care (Signed)
Discontinued standing goals as pt NWB BLE as of 05/20/23.  Problem: RH Balance Goal: LTG Patient will maintain dynamic standing balance (PT) Description: LTG:  Patient will maintain dynamic standing balance with assistance during mobility activities (PT) Outcome: Not Applicable Note: Discontinued standing goals as pt now NWB BLE.    Problem: Sit to Stand Goal: LTG:  Patient will perform sit to stand with assistance level (PT) Description: LTG:  Patient will perform sit to stand with assistance level (PT) Outcome: Not Applicable Note: Discontinued standing goals as pt now NWB BLE.

## 2023-05-21 NOTE — Progress Notes (Signed)
Physical Therapy Weekly Progress Note  Patient Details  Name: Louis Juarez. MRN: [id number] Date of Birth: 30-Apr-1941  Beginning of progress report period: May 14, 2023 End of progress report period: May 21, 2023  Today's Date: 05/21/2023 PT Individual Time: 1117-1206 PT Individual Time Calculation (min): 49 min   Patient has met 1 of 3 short term goals. Pt has made progress towards all long term goals. Pt mobility is impaired significantly by pain.  Discontinued standing goals as pt NWB B LE as of 05/20/23 2/2 movement of hardware in hip. Goals modified to reflect WC mobility and slide board transfer. Pt POC adjusted to every day for improved tolerance. Pt is performing rolling bilaterally with bed rails and supervision, supine to sit with use of bed features and min A, and sit to supine with mod-max A-level of assist fluctuates with pain. Pt performs slide board transfer to L with +2 min A to L for maintenance of B LE NWBing precautions. Prior to weightbearing precaution modification, pt requiring mod-max A for slide board transfer to R 2/2 pain. Will continue to assess.     Patient continues to demonstrate the following deficits muscle weakness, decreased cardiorespiratoy endurance, and decreased sitting balance, decreased postural control, decreased balance strategies, and difficulty maintaining precautions and therefore will continue to benefit from skilled PT intervention to increase functional independence with mobility.  Patient progressing toward long term goals..  Continue plan of care.  PT Short Term Goals Week 1:  PT Short Term Goal 1 (Week 1): Pt will complete bed mobility with modA. PT Short Term Goal 1 - Progress (Week 1): Met PT Short Term Goal 2 (Week 1): Pt will complete bed to chair transfer with modA. PT Short Term Goal 2 - Progress (Week 1): Met PT Short Term Goal 3 (Week 1): Pt will accurately recall and maintain WB precautions. PT Short Term Goal 3 -  Progress (Week 1): Met Week 2:  PT Short Term Goal 1 (Week 2): pt will perform supine to sit and sit to supine with min A or less consistently PT Short Term Goal 2 (Week 2): pt will perform slide board transfer with mod A or less consistently PT Short Term Goal 3 (Week 2): Pt will perform dynamic sitting balance with min A or less  Skilled Therapeutic Interventions/Progress Updates:      Pt supine in bed upon arrival. Pt agreeable to therapy. Pt denies any pain.Wife present throughout session.   Education provided about pt NWBing B LE, pt and wife able to provide teach back of precautions and purpose of precautions.   Donned pants with total A for feeding B LE through, pt able to pull up pants while rolling L and R with supervision with use of bed rails and verbal cues provided for technique. Pt able to scoot up in bed with B UE support on bed rails. Supine to sit with min A with use of bed features and increased time. Verbal cues provided for technique. Education provided to pt and wife on need for Pants/shorts for slide board transfer to reduce risk of skin breakdown.    Pt performed slide board transfer hospital bed to level WC with +2 min A to maintain B LE NWB precautions. Pt denies any increase in pain.   Discussed discharge plan with pt and wife. Wife reports plan to discharge home. Discussed pt car, wife reports she will be utilizing ambulance for transport and home entry. Discussed pt will need hospital bed, WC  with elevating leg rests, slide board, notified social worker.   Education provided on importance of upright positioning in [facility] for improved airway and secretion clearance, and pressure relief. Pt agreeable to stay in [facility] for lunch at OT session.   Pt seated in [facility] with all needs within reach and seatbelt alarm on.   Therapy Documentation Precautions:  Precautions Precautions: Fall Restrictions Weight Bearing Restrictions: Yes RUE Weight Bearing: Weight bearing as  tolerated RLE Weight Bearing: Non weight bearing LLE Weight Bearing: Non weight bearing Other Position/Activity Restrictions: LLE WBAT for transfers only  Therapy/Group: Individual Therapy  [facility], [name], [place], DPT  05/21/2023, 7:49 AM

## 2023-05-22 DIAGNOSIS — S32810G Multiple fractures of pelvis with stable disruption of pelvic ring, subsequent encounter for fracture with delayed healing: Secondary | ICD-10-CM | POA: Diagnosis not present

## 2023-05-22 DIAGNOSIS — T1490XA Injury, unspecified, initial encounter: Secondary | ICD-10-CM | POA: Diagnosis not present

## 2023-05-22 DIAGNOSIS — K5900 Constipation, unspecified: Secondary | ICD-10-CM | POA: Diagnosis not present

## 2023-05-22 DIAGNOSIS — F5101 Primary insomnia: Secondary | ICD-10-CM

## 2023-05-22 DIAGNOSIS — M009 Pyogenic arthritis, unspecified: Secondary | ICD-10-CM | POA: Diagnosis not present

## 2023-05-22 MED ORDER — POLYETHYLENE GLYCOL 3350 17 G PO PACK
17.0000 g | PACK | Freq: Every day | ORAL | Status: DC
Start: 2023-05-22 — End: 2023-05-23
  Administered 2023-05-22 – 2023-05-23 (×2): 17 g via ORAL
  Filled 2023-05-22 (×2): qty 1

## 2023-05-22 MED ORDER — SORBITOL 70 % SOLN: 30.0000 mL | Freq: Every day | Status: DC | PRN

## 2023-05-22 NOTE — Progress Notes (Signed)
PROGRESS NOTE   Subjective/Complaints:  Patient reported he slept better last night.  He later reported that he wanted to additional medication for sleep, reported interrupted sleep.  Nursing noted redness around his elbow wound.  Patient reports his pain is doing better today from prior days.  Last BM 10/31      ROS: Patient denies fever, rash, sore throat, blurred vision, dizziness, nausea, vomiting, diarrhea, cough, shortness of breath  headache, or mood change. Denies constipation today + Right pelvis area pain- continued + Insomnia + Chest congestion and cough-improved + Urinary retention + Constipation  Objective:   No results found. Recent Labs    05/21/23 0536  WBC 10.9*  HGB 10.1*  HCT 32.7*  PLT 371   Recent Labs    05/21/23 0536  NA 134*  K 4.1  CL 101  CO2 27  GLUCOSE 141*  BUN 18  CREATININE 0.63  CALCIUM 8.9    Intake/Output Summary (Last 24 hours) at 05/22/2023 0857 Last data filed at 05/22/2023 0755 Gross per 24 hour  Intake 837 ml  Output 200 ml  Net 637 ml        Physical Exam: Vital Signs Blood pressure 135/67, pulse 66, temperature 98.6 F (37 C), resp. rate 19, height 5\' 10"  (1.778 m), weight 109.2 kg, SpO2 93%.   General: No apparent distress, laying in bed, appears comfortable in bed HEENT: MMM, EOMI Neck: Supple without JVD or lymphadenopathy Heart: Reg rate and rhythm. No m/r/g appreciated Chest: CTAB, no increased WOB, no cough noted while was there Abdomen: Soft, non-tender, non-distended, bowel sounds positive. Extremities: No clubbing, cyanosis, or edema. Pulses are 2+ Psych: Pt's affect is appropriate. Pt is cooperative  Musculoskeletal:  Comments: Right elbow wound with small amount of drainage noted, please see images Slight b/l thigh tenderness  Prepubic and right lower quadrant incisions C/D/I  Neurological:     Mental Status: He is oriented to person,  place, and time. Follows commands. + memory deficits        Assessment/Plan: 1. Functional deficits which require 3+ hours per day of interdisciplinary therapy in a comprehensive inpatient rehab setting. Physiatrist is providing close team supervision and 24 hour management of active medical problems listed below. Physiatrist and rehab team continue to assess barriers to discharge/monitor patient progress toward functional and medical goals  Care Tool:  Bathing    Body parts bathed by patient: Right arm, Left arm, Chest, Abdomen, Face   Body parts bathed by helper: Front perineal area, Buttocks, Right upper leg, Left upper leg, Right lower leg, Left lower leg     Bathing assist Assist Level: Total Assistance - Patient < 25%     Upper Body Dressing/Undressing Upper body dressing   What is the patient wearing?: Hospital gown only    Upper body assist Assist Level: Moderate Assistance - Patient 50 - 74%    Lower Body Dressing/Undressing Lower body dressing      What is the patient wearing?: Hospital gown only, Incontinence brief     Lower body assist Assist for lower body dressing: Dependent - Patient 0%     Toileting Toileting    Toileting assist Assist for  toileting: Dependent - Patient 0%     Transfers Chair/bed transfer  Transfers assist     Chair/bed transfer assist level: Minimal Assistance - Patient > 75% (slide board B LE NWB +2 min A)     Locomotion Ambulation   Ambulation assist   Ambulation activity did not occur: Safety/medical concerns          Walk 10 feet activity   Assist  Walk 10 feet activity did not occur: Safety/medical concerns        Walk 50 feet activity   Assist Walk 50 feet with 2 turns activity did not occur: Safety/medical concerns         Walk 150 feet activity   Assist Walk 150 feet activity did not occur: Safety/medical concerns         Walk 10 feet on uneven surface  activity   Assist Walk  10 feet on uneven surfaces activity did not occur: Safety/medical concerns         Wheelchair     Assist Is the patient using a wheelchair?: Yes Type of Wheelchair: Manual    Wheelchair assist level: Supervision/Verbal cueing Max wheelchair distance: 150    Wheelchair 50 feet with 2 turns activity    Assist        Assist Level: Supervision/Verbal cueing   Wheelchair 150 feet activity     Assist      Assist Level: Supervision/Verbal cueing   Blood pressure 135/67, pulse 66, temperature 98.6 F (37 C), resp. rate 19, height 5\' 10"  (1.778 m), weight 109.2 kg, SpO2 93%.  Medical Problem List and Plan: 1. Functional deficits secondary to polytrauma             -patient may shower             -ELOS/Goals: 14-20 days S with PT/OT             -Continue CIR -SLP consult for cognition-felt to be at baseline, slums score of 21 out of 30 -Team conference today please see physician documentation under team conference tab, met with team  to discuss problems,progress, and goals. Formulized individual treatment plan based on medical history, underlying problem and comorbidities.  --Dr. Jena Gauss rec NWB, slide board transfers, only 1 session of therapy a day.  Recommended consideration of SNF, patient is not interested SNF.  He has several family members coming to help -QD therapy  -Patient seen by recreational therapy today  2.  Antithrombotics: -DVT/anticoagulation:  Pharmaceutical: Lovenox 30mg  BID, Eliquis 2.5 mg twice daily for 30 days on discharge for DVT prophylaxis             -antiplatelet therapy: N/AA 3. Pain Management: continue tylenol 650mg  q6h, robaxin 1000mg  q8h, Oxycodone as needed severe pain -10/25 Will schedule oxycodone 5 mg at 7 AM, gabapentin 100 mg 3 times daily started 4. Mood/Behavior/Sleep: LCSW to follow for evaluation and support             -antipsychotic agents: Seroquel 50mg  as needed  -Lorazepam 0.5mg  QHS -05/16/23 sleeping fair but wants  to try increased melatonin dose, inc to 5mg  at bedtime, monitor sleep for now-- improved 05/17/23! -05/18/2023 will increase Seroquel to 75 mg at bedtime -10/31 Increase seroquel to 100mg  HS  -Sleep-wake chart ordered 5. Neuropsych/cognition: This patient is  capable of making decisions on his own behalf. 6. Skin/Wound Care: Routine pressure-relief measures.   --Monitor right elbow for drainage/healing. 7. Fluids/Electrolytes/Nutrition: Monitor I/os.  Continue Ensure supplements.             --  Add vitamin C/D3, MVI, zinc and Juven. 8. Pelvic ring fracture s/p percutaneous fixation: Strict NWB RLE and WBAT LLE for transfers only.  -- hardware loosening noted, NWB b/l LE, slide board transfers  -Air mattress for wound prevention 9. Right elbow wound drainage: Now on doxycycline.  Reports night sweats.  Cultures sent -Discussed with Dr. Jena Gauss yesterday who recommended Doxycycline 100 mg twice daily, his team plans to follow-up and check on him and CIR.   -10/25 wound VAC started by Ortho appreciate assistance, continue current antibiotics, will recheck labs tomorrow -05/16/23 BCx and elbow Cx NGTD x3 days, WBC down to 10.4 today; monitor-- still NGTD 05/17/23 -10/28 WBC 12.0 today, continue doxycycline and wound VAC -10/30 wound vac removed by ortho -10/31 wound with small amount of draining, continue to monitor and continue abx, WBC down to 10.9 -11/1 orthopedics contacted regarding wound, continue antibiotics and monitor for now 10. Acute blood loss anemia: Monitor for any signs of bleeding.  Hgb stable 10.1 on 05/21/23 11. Leukocytosis: White count trending down from 23.6-->12.9>> 10.1 10/26; monitor routinely 12.  Thrombocytosis: Likely reactive.  Continue to monitor.  446 on 10/28 13. Low protein stores: Albumin down to 2.2--supplements added. 14. H/o NICM/HF s/p ICD:  Daily weights/monitor for signs of overload. Change diet to heart healthy.  No signs of fluid overload -- On Coreg 25mg   BID, aldactone 25mg  QD, Lasix 20mg  BID, and Pravachol 80mg  daily, Zetia 10mg  daily             --decrease Kdur to 20 meq bid as K trending up.  --Repeat chest x-ray in a.m.- no acute findings -10/29 recheck chest x-ray today- no acute changes -11/1 patient denies any shortness of breath, reports cough and congestion is better today Filed Weights   05/20/23 0546 05/21/23 0546 05/22/23 0439  Weight: 79.1 kg 108.9 kg 109.2 kg   Vitals:   05/19/23 0447 05/19/23 1447 05/19/23 1958 05/20/23 0443  BP: (!) 144/63 138/70 126/61 (!) 142/65   05/20/23 1402 05/20/23 2017 05/21/23 0650 05/21/23 0738  BP: (!) 134/56 130/74 (!) 140/58 (!) 155/64   05/21/23 1305 05/21/23 1403 05/21/23 1931 05/22/23 0530  BP: 129/60 (!) 147/62 (!) 114/55 135/67     15. Multiple rib fractures: Encourage pulmonary hygiene. Incentive spirometer ordered  -10/29 patient bringing up more mucus today, may be due to the Mucinex   16. Urinary retention: Monitor voiding with PVR checks.  continue Flomax 0.8mg  daily.  -10/24 Required IC this AM, continue to monitor  PVRs  -Timed voids every 4 hours, consider UA if urinary symptoms worsen -05/16/23 PVRs decreasing, hasn't needed cath since yesterday; monitor -05/17/23 needed cath last night for and in the afternoon yesterday; defer to weekday team regarding management of this -10/31 intermittently incontinent of bladder, suspect limited mobility is contributing.  Elevated bladder scan noted by nursing when I came in the room.  He denied sensation of needing to void.  We discussed offering him a urinal to try to void.  Appears he had a PVR later in the morning with 9 mL.  Timed voids ordered.   17. Constipation. Appears to be having regular Bms.   -Cont miralax prn -05/17/23 last BM this morning but loose, no stool softeners being given, likely from doxycycline, monitor for ongoing diarrhea -11/1 LBM yesterday but small, will start MiraLAX daily, sorbitol as needed  ordered  LOS: 9 days A FACE TO FACE EVALUATION WAS PERFORMED  Fanny Dance 05/22/2023, 8:57 AM

## 2023-05-22 NOTE — Progress Notes (Signed)
Physical Therapy Session Note  Patient Details  Name: Louis Juarez. MRN: [id number] Date of Birth: 04/14/1941  Today's Date: 05/22/2023 PT Individual Time: 1300-1327 PT Individual Time Calculation (min): 27 min   Short Term Goals: Week 2:  PT Short Term Goal 1 (Week 2): pt will perform supine to sit and sit to supine with min A or less consistently PT Short Term Goal 2 (Week 2): pt will perform slide board transfer with mod A or less consistently PT Short Term Goal 3 (Week 2): Pt will perform dynamic sitting balance with min A or less  Skilled Therapeutic Interventions/Progress Updates:      Pt seated in WC upon arrival. Pt agreeable to therapy. Pt denies any pain.   Pt transported dependent in WC to main gym.   Pt performed slide board transfer WC to mat table to R and mat table to WC to L with min A for maintainenance of B LE NWBing precautions. Min verbal cues provided for technique. Pt denies any increase in pain.   Pt requesting to return to bed after session. Pt performed slide board transfer WC to hospital bed (airbed) with heavy min-mod A for maintenance of B LE NWBing precautions and trunk control, verbal cues provided for technique. Pt denies any increase in pain.   Sit to supine with sup-min A, mod verbal cues provided for technique.   Pt supine in bed with nursing in room at end of session.   Therapy Documentation Precautions:  Precautions Precautions: Fall Restrictions Weight Bearing Restrictions: Yes RUE Weight Bearing: Weight bearing as tolerated RLE Weight Bearing: Non weight bearing LLE Weight Bearing: Non weight bearing Other Position/Activity Restrictions: LLE WBAT for transfers only  Therapy/Group: Individual Therapy  [facility] [name], [place], DPT  05/22/2023, 4:28 PM

## 2023-05-22 NOTE — Progress Notes (Signed)
Occupational Therapy Session Note  Patient Details  Name: Louis Juarez. MRN: [id number] Date of Birth: Dec 03, 1940  Today's Date: 05/22/2023 OT Individual Time: 0109-3235 OT Individual Time Calculation (min): 45 min    Short Term Goals: Week 2:  OT Short Term Goal 1 (Week 2): Pt will perform squat-pivot transfer to BSC/toilet with consistent Min A. OT Short Term Goal 2 (Week 2): Pt will manage LB dressing with consistent Mod A + LRAD. OT Short Term Goal 3 (Week 2): Pt will complete LB bathing with consistent Min A + LRAD.  Skilled Therapeutic Interventions/Progress Updates:   Pt seen for skilled OT session with wife bedside and pt bed level. NT already bathed and changed brief thus OT initiated family educ with wife who video tapes some portions of treatment for carryover for home. Hospital bed level slip on shorts and slipper socks with max a with pt rolling R/L with rails with CGA. Supine to sit with min a with bed features. +2 TT support present for TB training with pt requiring mod A bed to w/c with new cushion added as ROHO pt reported did not provide enough support. Sink side UB sponge bathing, shaving and pull over shirt with set up only. New elevating leg rests for chair added and pt left w/c level with wife bedside, needs and chair alarm engaged.   Pain: 7/10 R side of pelvis, relief with meds given at end of session and new cushion added to w/c with 4/10 report   Therapy Documentation Precautions:  Precautions Precautions: Fall Restrictions Weight Bearing Restrictions: Yes RUE Weight Bearing: Weight bearing as tolerated RLE Weight Bearing: Non weight bearing LLE Weight Bearing: Non weight bearing Other Position/Activity Restrictions: LLE WBAT for transfers only  Therapy/Group: Individual Therapy  Vicenta Dunning 05/22/2023, 1:31 PM

## 2023-05-22 NOTE — Progress Notes (Addendum)
Right elbow incision drainage has decreased compared to a week ago. WAC was removed on 10/30 as non functional--min to mod serosanguinous drainage with blanchable erythema.  Reached out to ortho who recommends monitoring for now.

## 2023-05-22 NOTE — Plan of Care (Signed)
  Problem: RH Car Transfers Goal: LTG Patient will perform car transfers with assist (PT) Description: LTG: Patient will perform car transfers with assistance (PT). Outcome: Not Applicable Flowsheets (Taken 05/22/2023 0801) LTG: Pt will perform car transfers with assist:: (discontinued goal as pt family plans to utilize EMS transport home) --

## 2023-05-22 NOTE — Progress Notes (Signed)
Patient ID: Louis Brosh., male   DOB: October 02, 1940, 82 y.o.   MRN: [id number] Louis Juarez to scheduled family education on 11/7 she will be here at 9;00-11:00 have placed on calendar for the scheduler-Erin.

## 2023-05-23 DIAGNOSIS — K5901 Slow transit constipation: Secondary | ICD-10-CM | POA: Diagnosis not present

## 2023-05-23 DIAGNOSIS — T1490XA Injury, unspecified, initial encounter: Secondary | ICD-10-CM | POA: Diagnosis not present

## 2023-05-23 DIAGNOSIS — I10 Essential (primary) hypertension: Secondary | ICD-10-CM | POA: Diagnosis not present

## 2023-05-23 DIAGNOSIS — J029 Acute pharyngitis, unspecified: Secondary | ICD-10-CM

## 2023-05-23 MED ORDER — POLYETHYLENE GLYCOL 3350 17 G PO PACK
17.0000 g | PACK | Freq: Two times a day (BID) | ORAL | Status: DC
  Administered 2023-05-23 – 2023-06-01 (×17): 17 g via ORAL
  Filled 2023-05-23 (×18): qty 1

## 2023-05-23 MED ORDER — MENTHOL 3 MG MT LOZG
1.0000 | LOZENGE | OROMUCOSAL | Status: DC | PRN
  Filled 2023-05-23: qty 9

## 2023-05-23 MED ORDER — LIDOCAINE HCL URETHRAL/MUCOSAL 2 % EX GEL
1.0000 | CUTANEOUS | Status: DC | PRN
  Administered 2023-05-23: 1 via URETHRAL
  Filled 2023-05-23: qty 6

## 2023-05-23 NOTE — Progress Notes (Incomplete)
Occupational Therapy Session Note  Patient Details  Name: Louis Juarez. MRN: [id number] Date of Birth: September 13, 1940  {CHL IP REHAB OT TIME CALCULATIONS:[id number]}   Short Term Goals: Week 2:  OT Short Term Goal 1 (Week 2): Pt will perform squat-pivot transfer to BSC/toilet with consistent Min A. OT Short Term Goal 2 (Week 2): Pt will manage LB dressing with consistent Mod A + LRAD. OT Short Term Goal 3 (Week 2): Pt will complete LB bathing with consistent Min A + LRAD.  Skilled Therapeutic Interventions/Progress Updates:  Pt received *** for skilled OT session with focus on ***. Pt agreeable to interventions, demonstrating overall *** mood. Pt reported ***/10 pain, stating "***" in reference to ***. OT offering intermediate rest breaks and positioning suggestions throughout session to address pain/fatigue and maximize participation/safety in session.    Pt remained *** with all immediate needs met at end of session. Pt continues to be appropriate for skilled OT intervention to promote further functional independence.   Therapy Documentation Precautions:  Precautions Precautions: Fall Restrictions Weight Bearing Restrictions: Yes RUE Weight Bearing: Weight bearing as tolerated RLE Weight Bearing: Non weight bearing LLE Weight Bearing: Non weight bearing Other Position/Activity Restrictions: LLE WBAT for transfers only   Therapy/Group: Individual Therapy  [name], OTR/L, MSOT  05/23/2023, 3:19 PM

## 2023-05-23 NOTE — Progress Notes (Addendum)
Occupational Therapy Session Note  Patient Details  Name: Louis Juarez. MRN: [id number] Date of Birth: [birth year]  Today's Date: 05/23/2023 OT Individual Time: 1415-1445 OT Individual Time Calculation (min): 30 min    Short Term Goals: Week 1:  OT Short Term Goal 1 (Week 1): Pt will transfer with TB to [facility] with mod A to and from L side OT Short Term Goal 1 - Progress (Week 1): Met OT Short Term Goal 2 (Week 1): Pt will complete LB dressing with AE at EOB with mod a OT Short Term Goal 2 - Progress (Week 1): Progressing toward goal OT Short Term Goal 3 (Week 1): Pt will complete grooming sinkside seated with S w/c level OT Short Term Goal 3 - Progress (Week 1): Met Week 2:  OT Short Term Goal 1 (Week 2): Pt will perform squat-pivot transfer to BSC/toilet with consistent Min A. OT Short Term Goal 2 (Week 2): Pt will manage LB dressing with consistent Mod A + LRAD. OT Short Term Goal 3 (Week 2): Pt will complete LB bathing with consistent Min A + LRAD.  Skilled Therapeutic Interventions/Progress Updates:   Pain:  pt reports 6/10 pain in LB, he had been premedicated    Pt received in bed and discussed how uncomfortable/ frustrated he has been feeling with not knowing which pain meds he can get and when. Found a notebook his wife had started and suggested that the next time the RN brings his medication to write down what is was and the time and then ask at what hour could he receive more and what he is able to receive.  I also recommended he make notes of how he feels or if there are side effects (ie dry mouth) so he can have a discussion with the MD about what is working well for him.  I explained that this will help him take some ownership and help him feel more aware and in control of the pain management as this is one of his biggest barriers to getting home. He is now NWB on BLE, suggested that along with the DME recs his therapists have made a hospital bed would be beneficial as he  will need to do slide board transfers.  Also suggested an egg crate cover on the mattress may help as the hospital bed mattress would not be extremely soft.   Reviewed pressure relief by leaning side to side vs only leaning on R hip which he prefers.   Suggested he work on a transfer out of bed to w/c.  This is his only session of therapy today and pt quite verbal that he did not feel comfortable with nursing staff transferring him.  He stated he had a negative experience in which he was sliding forward off the board and had to be pulled on to keep from falling forward and having weight on his legs.  Pt strongly declined getting out of bed.  Again to help him have ownership/control in his transfers, demonstrated to pt (with myself doing a transfer) where exactly to put the board on upper thighs not buttocks, how to position head in relation to movement, and if the transfer does not start well to readjust and restart.  Pt did like this idea and I encouraged him to practice telling staff how to place the board so when he goes home he is very comfortable directing his family.   Pt has yellow theraband in his room. Reviewed some arm exercises he can work  on.   Pt resting in bed with all needs met, alarm set.     Therapy Documentation Precautions:  Precautions Precautions: Fall Restrictions Weight Bearing Restrictions: Yes RUE Weight Bearing: Weight bearing as tolerated RLE Weight Bearing: Non weight bearing LLE Weight Bearing: Non weight bearing Other Position/Activity Restrictions: NWB BLE          Therapy/Group: Individual Therapy  Guliana Weyandt 05/23/2023, 3:46 PM

## 2023-05-23 NOTE — Progress Notes (Addendum)
PROGRESS NOTE   Subjective/Complaints:  Pt doing poorly today, very frustrated that he hasn't slept well. Slept "the worst he has" last night. Didn't ask for seroquel though, doesn't know to ask for it. Wrote it down for him.  Has a slight sore throat this morning.  Pain well controlled with meds. LBM yesterday per documentation but pt reported it hadn't been in a couple days. Urinating ok. Denies any other complaints or concerns today.     ROS: Patient denies fever, rash, sore throat, blurred vision, dizziness, nausea, vomiting, diarrhea, cough, shortness of breath  headache, or mood change.  + Right pelvis area pain- continued + Insomnia + Chest congestion and cough-improved + Urinary retention + Constipation-improved  Objective:   No results found. Recent Labs    05/21/23 0536  WBC 10.9*  HGB 10.1*  HCT 32.7*  PLT 371   Recent Labs    05/21/23 0536  NA 134*  K 4.1  CL 101  CO2 27  GLUCOSE 141*  BUN 18  CREATININE 0.63  CALCIUM 8.9    Intake/Output Summary (Last 24 hours) at 05/23/2023 0948 Last data filed at 05/23/2023 0735 Gross per 24 hour  Intake 598 ml  Output 950 ml  Net -352 ml        Physical Exam: Vital Signs Blood pressure (!) 140/75, pulse 66, temperature 97.9 F (36.6 C), resp. rate 18, height 5\' 10"  (1.778 m), weight 109.2 kg, SpO2 100%.   General: No apparent distress although irritable, laying in bed, appears comfortable in bed HEENT: MMM, EOMI Neck: Supple without JVD or lymphadenopathy Heart: Reg rate and rhythm. No m/r/g appreciated Chest: CTAB, no increased WOB, coughed vigorously after choking on some water/miralax. Able to clear his airway well, coughed up a pill later.  Abdomen: Soft, non-tender, non-distended, bowel sounds positive. Extremities: No clubbing, cyanosis, or edema. Pulses are 2+ Psych: Pt's affect is appropriate. Pt is cooperative  PRIOR  EXAM: Musculoskeletal:  Comments: Right elbow wound with small amount of drainage noted, please see images Slight b/l thigh tenderness  Prepubic and right lower quadrant incisions C/D/I  Neurological:     Mental Status: He is oriented to person, place, and time. Follows commands. + memory deficits        Assessment/Plan: 1. Functional deficits which require 3+ hours per day of interdisciplinary therapy in a comprehensive inpatient rehab setting. Physiatrist is providing close team supervision and 24 hour management of active medical problems listed below. Physiatrist and rehab team continue to assess barriers to discharge/monitor patient progress toward functional and medical goals  Care Tool:  Bathing    Body parts bathed by patient: Right arm, Left arm, Chest, Abdomen, Face   Body parts bathed by helper: Front perineal area, Buttocks, Right upper leg, Left upper leg, Right lower leg, Left lower leg     Bathing assist Assist Level: Total Assistance - Patient < 25%     Upper Body Dressing/Undressing Upper body dressing   What is the patient wearing?: Hospital gown only    Upper body assist Assist Level: Moderate Assistance - Patient 50 - 74%    Lower Body Dressing/Undressing Lower body dressing  What is the patient wearing?: Hospital gown only, Incontinence brief     Lower body assist Assist for lower body dressing: Dependent - Patient 0%     Toileting Toileting    Toileting assist Assist for toileting: Dependent - Patient 0%     Transfers Chair/bed transfer  Transfers assist     Chair/bed transfer assist level: Minimal Assistance - Patient > 75% (slide board B LE NWB +2 min A)     Locomotion Ambulation   Ambulation assist   Ambulation activity did not occur: Safety/medical concerns          Walk 10 feet activity   Assist  Walk 10 feet activity did not occur: Safety/medical concerns        Walk 50 feet activity   Assist Walk 50  feet with 2 turns activity did not occur: Safety/medical concerns         Walk 150 feet activity   Assist Walk 150 feet activity did not occur: Safety/medical concerns         Walk 10 feet on uneven surface  activity   Assist Walk 10 feet on uneven surfaces activity did not occur: Safety/medical concerns         Wheelchair     Assist Is the patient using a wheelchair?: Yes Type of Wheelchair: Manual    Wheelchair assist level: Supervision/Verbal cueing Max wheelchair distance: 150    Wheelchair 50 feet with 2 turns activity    Assist        Assist Level: Supervision/Verbal cueing   Wheelchair 150 feet activity     Assist      Assist Level: Supervision/Verbal cueing   Blood pressure (!) 140/75, pulse 66, temperature 97.9 F (36.6 C), resp. rate 18, height 5\' 10"  (1.778 m), weight 109.2 kg, SpO2 100%.  Medical Problem List and Plan: 1. Functional deficits secondary to polytrauma             -patient may shower             -ELOS/Goals: 14-20 days S with PT/OT             -Continue CIR -SLP consult for cognition-felt to be at baseline, slums score of 21 out of 30 -Team conference today please see physician documentation under team conference tab, met with team  to discuss problems,progress, and goals. Formulized individual treatment plan based on medical history, underlying problem and comorbidities.  --Dr. Jena Gauss rec NWB, slide board transfers, only 1 session of therapy a day.  Recommended consideration of SNF, patient is not interested SNF.  He has several family members coming to help -QD therapy  -Patient seen by recreational therapy today  2.  Antithrombotics: -DVT/anticoagulation:  Pharmaceutical: Lovenox 30mg  BID, Eliquis 2.5 mg twice daily for 30 days on discharge for DVT prophylaxis             -antiplatelet therapy: N/A 3. Pain Management: continue tylenol 650mg  q6h, robaxin 1000mg  q8h, Oxycodone as needed severe pain -10/25 Will  schedule oxycodone 5 mg at 7 AM, gabapentin 100 mg 3 times daily started -05/23/23 mildly sore throat, cepacol ordered 4. Mood/Behavior/Sleep: LCSW to follow for evaluation and support             -antipsychotic agents: Seroquel 50mg  as needed  -Lorazepam 0.5mg  QHS -05/16/23 sleeping fair but wants to try increased melatonin dose, inc to 5mg  at bedtime, monitor sleep for now-- improved 05/17/23! -05/18/2023 will increase Seroquel to 75 mg at bedtime -10/31 Increase seroquel  to 100mg  HS  -Sleep-wake chart ordered -05/23/23 didn't sleep well but didn't get seroquel-- wrote it down for him to ask, discussed with nursing to also ask if he wants it tonight 5. Neuropsych/cognition: This patient is  capable of making decisions on his own behalf. 6. Skin/Wound Care: Routine pressure-relief measures.   --Monitor right elbow for drainage/healing. 7. Fluids/Electrolytes/Nutrition: Monitor I/os.  Continue Ensure supplements.             -- Add vitamin C/D3, MVI, zinc and Juven. 8. Pelvic ring fracture s/p percutaneous fixation: Strict NWB RLE and WBAT LLE for transfers only.  -- hardware loosening noted, NWB b/l LE, slide board transfers  -Air mattress for wound prevention 9. Right elbow wound drainage: Now on doxycycline.  Reports night sweats.  Cultures sent -Discussed with Dr. Jena Gauss yesterday who recommended Doxycycline 100 mg twice daily, his team plans to follow-up and check on him and CIR.   -10/25 wound VAC started by Ortho appreciate assistance, continue current antibiotics, will recheck labs tomorrow -05/16/23 BCx and elbow Cx NGTD x3 days, WBC down to 10.4 today; monitor-- still NGTD 05/17/23 -10/28 WBC 12.0 today, continue doxycycline and wound VAC -10/30 wound vac removed by ortho -10/31 wound with small amount of draining, continue to monitor and continue abx, WBC down to 10.9 -11/1 orthopedics contacted regarding wound, continue antibiotics and monitor for now 10. Acute blood loss anemia:  Monitor for any signs of bleeding.  Hgb stable 10.1 on 05/21/23 11. Leukocytosis: White count trending down from 23.6-->12.9>> 10.1 10/26; monitor routinely 12.  Thrombocytosis: Likely reactive.  Continue to monitor.  446 on 10/28 13. Low protein stores: Albumin down to 2.2--supplements added. 14. H/o NICM/HF s/p ICD:  Daily weights/monitor for signs of overload. Change diet to heart healthy.  No signs of fluid overload -- On Coreg 25mg  BID, aldactone 25mg  QD, Lasix 20mg  BID, and Pravachol 80mg  daily, Zetia 10mg  daily             --decrease Kdur to 20 meq bid as K trending up.  --Repeat chest x-ray in a.m.- no acute findings -10/29 recheck chest x-ray today- no acute changes -11/1 patient denies any shortness of breath, reports cough and congestion is better today -05/23/23 BPs a little up, monitor trend; wt stable Filed Weights   05/21/23 0546 05/22/23 0439 05/23/23 0423  Weight: 108.9 kg 109.2 kg 109.2 kg   Vitals:   05/20/23 1402 05/20/23 2017 05/21/23 0650 05/21/23 0738  BP: (!) 134/56 130/74 (!) 140/58 (!) 155/64   05/21/23 1305 05/21/23 1403 05/21/23 1931 05/22/23 0530  BP: 129/60 (!) 147/62 (!) 114/55 135/67   05/22/23 0859 05/22/23 1407 05/22/23 1913 05/23/23 0512  BP: (!) 140/68 (!) 140/75 (!) 140/67 (!) 140/75     15. Multiple rib fractures: Encourage pulmonary hygiene. Incentive spirometer ordered  -10/29 patient bringing up more mucus today, may be due to the Mucinex   16. Urinary retention: Monitor voiding with PVR checks.  continue Flomax 0.8mg  daily.  -10/24 Required IC this AM, continue to monitor  PVRs  -Timed voids every 4 hours, consider UA if urinary symptoms worsen -05/16/23 PVRs decreasing, hasn't needed cath since yesterday; monitor -05/17/23 needed cath last night for and in the afternoon yesterday; defer to weekday team regarding management of this -10/31 intermittently incontinent of bladder, suspect limited mobility is contributing.  Elevated  bladder scan noted by nursing when I came in the room.  He denied sensation of needing to void.  We discussed  offering him a urinal to try to void.  Appears he had a PVR later in the morning with 9 mL.  Timed voids ordered.   17. Constipation. Appears to be having regular Bms.   -Cont miralax prn -05/17/23 last BM this morning but loose, no stool softeners being given, likely from doxycycline, monitor for ongoing diarrhea -11/1 LBM yesterday but small, will start MiraLAX daily, sorbitol as needed ordered -05/23/23 LBM yesterday but pt reports no good one in a couple days, will increase miralax BID for now.   I spent >41mins performing patient care related activities, including face to face time, documentation time, med management, discussion of meds with patient and nursing staff, and overall coordination of care.   LOS: 10 days A FACE TO FACE EVALUATION WAS PERFORMED  [address] 05/23/2023, 9:48 AM

## 2023-05-24 DIAGNOSIS — I10 Essential (primary) hypertension: Secondary | ICD-10-CM | POA: Diagnosis not present

## 2023-05-24 DIAGNOSIS — T1490XA Injury, unspecified, initial encounter: Secondary | ICD-10-CM | POA: Diagnosis not present

## 2023-05-24 DIAGNOSIS — K5901 Slow transit constipation: Secondary | ICD-10-CM | POA: Diagnosis not present

## 2023-05-24 NOTE — Plan of Care (Signed)
  Problem: Consults Goal: RH GENERAL PATIENT EDUCATION Description: See Patient Education module for education specifics. Outcome: Progressing Goal: Skin Care Protocol Initiated - if Braden Score 18 or less Description: If consults are not indicated, leave blank or document N/A Outcome: Progressing   Problem: RH BOWEL ELIMINATION Goal: RH STG MANAGE BOWEL WITH ASSISTANCE Description: STG Manage Bowel with min Assistance. Outcome: Progressing Goal: RH STG MANAGE BOWEL W/MEDICATION W/ASSISTANCE Description: STG Manage Bowel with Medication with min Assistance. Outcome: Progressing   Problem: RH BLADDER ELIMINATION Goal: RH STG MANAGE BLADDER WITH ASSISTANCE Description: STG Manage Bladder With min Assistance Outcome: Progressing Goal: RH STG MANAGE BLADDER WITH MEDICATION WITH ASSISTANCE Description: STG Manage Bladder With Medication With min Assistance. Outcome: Progressing   Problem: RH SKIN INTEGRITY Goal: RH STG SKIN FREE OF INFECTION/BREAKDOWN Description: Incisions will continue to heal and be free of infection/breakdown with min assist  Outcome: Progressing Goal: RH STG MAINTAIN SKIN INTEGRITY WITH ASSISTANCE Description: STG Maintain Skin Integrity With min Assistance. Outcome: Progressing Goal: RH STG ABLE TO PERFORM INCISION/WOUND CARE W/ASSISTANCE Description: STG Able To Perform Incision/Wound Care With min Assistance. Outcome: Progressing   Problem: RH SAFETY Goal: RH STG ADHERE TO SAFETY PRECAUTIONS W/ASSISTANCE/DEVICE Description: STG Adhere to Safety Precautions With cueing Assistance/Device. Outcome: Progressing Goal: RH STG DECREASED RISK OF FALL WITH ASSISTANCE Description: STG Decreased Risk of Fall With min Assistance. Outcome: Progressing   Problem: RH PAIN MANAGEMENT Goal: RH STG PAIN MANAGED AT OR BELOW PT'S PAIN GOAL Description: Less than 4 with PRN medications min assist  Outcome: Progressing   Problem: RH KNOWLEDGE DEFICIT GENERAL Goal: RH  STG INCREASE KNOWLEDGE OF SELF CARE AFTER HOSPITALIZATION Description: Patient/caregiver will be able to manage medications, self care, weight bearing precautions, and diet/lifestyle modifications to improve health from nursing education, nursing handouts, and other resources independently  Outcome: Progressing

## 2023-05-24 NOTE — Progress Notes (Signed)
Patient was sat up 90 degrees in bed for dinner. Nurse noticed patient having difficulty eating and drinking with small bites without coughing phlegm.   Nurse suctioned patient and he stated he felt okay and did not want his dinner but would like to take the meds. Nurse offered the meds to be placed in apple sauce, patient agreed and took it 1 at a time but had a follow of productive coughs still.   Nurse encouraged patient to sit up for the time being as he could not swallow without coughing vigorously.   PA notified.

## 2023-05-24 NOTE — Progress Notes (Signed)
PROGRESS NOTE   Subjective/Complaints:  Pt doing SO MUCH BETTER today! Slept great, got melatonin AND seroquel at 8pm and slept great. Wrote down the med on his board so he knows what to ask for if he feels he needs it.   Pain well controlled with meds. LBM this morning. Urinating ok. Denies any other complaints or concerns today.     ROS: Patient denies fever, rash, sore throat, blurred vision, dizziness, nausea, vomiting, diarrhea, cough, shortness of breath  headache, or mood change.  + Right pelvis area pain- continued + Insomnia + Chest congestion and cough-improved + Urinary retention + Constipation-improved  Objective:   No results found. No results for input(s): "WBC", "HGB", "HCT", "PLT" in the last 72 hours.  No results for input(s): "NA", "K", "CL", "CO2", "GLUCOSE", "BUN", "CREATININE", "CALCIUM" in the last 72 hours.   Intake/Output Summary (Last 24 hours) at 05/24/2023 0749 Last data filed at 05/24/2023 0738 Gross per 24 hour  Intake 960 ml  Output 1250 ml  Net -290 ml        Physical Exam: Vital Signs Blood pressure (!) 149/68, pulse 69, temperature (!) 97.5 F (36.4 C), resp. rate 15, height 5\' 10"  (1.778 m), weight 110.6 kg, SpO2 95%.   General: No apparent distress and much happier, laying in bed, appears comfortable in bed HEENT: MMM, EOMI Neck: Supple without JVD or lymphadenopathy Heart: Reg rate and rhythm. No m/r/g appreciated Chest: CTAB, no increased WOB, no cough today Abdomen: Soft, non-tender, non-distended, bowel sounds positive. Extremities: No clubbing, cyanosis, or edema. Pulses are 2+ Psych: Pt's affect is appropriate. Pt is cooperative. Happier today.   PRIOR EXAM: Musculoskeletal:  Comments: Right elbow wound with small amount of drainage noted, please see images Slight b/l thigh tenderness  Prepubic and right lower quadrant incisions C/D/I  Neurological:     Mental  Status: He is oriented to person, place, and time. Follows commands. + memory deficits        Assessment/Plan: 1. Functional deficits which require 3+ hours per day of interdisciplinary therapy in a comprehensive inpatient rehab setting. Physiatrist is providing close team supervision and 24 hour management of active medical problems listed below. Physiatrist and rehab team continue to assess barriers to discharge/monitor patient progress toward functional and medical goals  Care Tool:  Bathing    Body parts bathed by patient: Right arm, Left arm, Chest, Abdomen, Face   Body parts bathed by helper: Front perineal area, Buttocks, Right upper leg, Left upper leg, Right lower leg, Left lower leg     Bathing assist Assist Level: Total Assistance - Patient < 25%     Upper Body Dressing/Undressing Upper body dressing   What is the patient wearing?: Hospital gown only    Upper body assist Assist Level: Moderate Assistance - Patient 50 - 74%    Lower Body Dressing/Undressing Lower body dressing      What is the patient wearing?: Hospital gown only, Incontinence brief     Lower body assist Assist for lower body dressing: Dependent - Patient 0%     Toileting Toileting    Toileting assist Assist for toileting: Dependent - Patient 0%  Transfers Chair/bed transfer  Transfers assist     Chair/bed transfer assist level: Minimal Assistance - Patient > 75% (slide board B LE NWB +2 min A)     Locomotion Ambulation   Ambulation assist   Ambulation activity did not occur: Safety/medical concerns          Walk 10 feet activity   Assist  Walk 10 feet activity did not occur: Safety/medical concerns        Walk 50 feet activity   Assist Walk 50 feet with 2 turns activity did not occur: Safety/medical concerns         Walk 150 feet activity   Assist Walk 150 feet activity did not occur: Safety/medical concerns         Walk 10 feet on uneven  surface  activity   Assist Walk 10 feet on uneven surfaces activity did not occur: Safety/medical concerns         Wheelchair     Assist Is the patient using a wheelchair?: Yes Type of Wheelchair: Manual    Wheelchair assist level: Supervision/Verbal cueing Max wheelchair distance: 150    Wheelchair 50 feet with 2 turns activity    Assist        Assist Level: Supervision/Verbal cueing   Wheelchair 150 feet activity     Assist      Assist Level: Supervision/Verbal cueing   Blood pressure (!) 149/68, pulse 69, temperature (!) 97.5 F (36.4 C), resp. rate 15, height 5\' 10"  (1.778 m), weight 110.6 kg, SpO2 95%.  Medical Problem List and Plan: 1. Functional deficits secondary to polytrauma             -patient may shower             -ELOS/Goals: 14-20 days S with PT/OT             -Continue CIR -SLP consult for cognition-felt to be at baseline, slums score of 21 out of 30 -Team conference today please see physician documentation under team conference tab, met with team  to discuss problems,progress, and goals. Formulized individual treatment plan based on medical history, underlying problem and comorbidities.  --Dr. Jena Gauss rec NWB, slide board transfers, only 1 session of therapy a day.  Recommended consideration of SNF, patient is not interested SNF.  He has several family members coming to help -QD therapy  -Patient seen by recreational therapy today  2.  Antithrombotics: -DVT/anticoagulation:  Pharmaceutical: Lovenox 30mg  BID, Eliquis 2.5 mg twice daily for 30 days on discharge for DVT prophylaxis             -antiplatelet therapy: N/A 3. Pain Management: continue tylenol 650mg  q6h, robaxin 1000mg  q8h, Oxycodone as needed severe pain -10/25 Will schedule oxycodone 5 mg at 7 AM, gabapentin 100 mg 3 times daily started -05/23/23 mildly sore throat, cepacol ordered 4. Mood/Behavior/Sleep: LCSW to follow for evaluation and support             -antipsychotic  agents: Seroquel 50mg  as needed  -Lorazepam 0.5mg  QHS -05/16/23 sleeping fair but wants to try increased melatonin dose, inc to 5mg  at bedtime, monitor sleep for now-- improved 05/17/23! -05/18/2023 will increase Seroquel to 75 mg at bedtime -10/31 Increase seroquel to 100mg  HS  -Sleep-wake chart ordered -05/23/23 didn't sleep well but didn't get seroquel-- wrote it down for him to ask, discussed with nursing to also ask if he wants it tonight-- helped 05/24/23! 5. Neuropsych/cognition: This patient is  capable of making decisions on his  own behalf. 6. Skin/Wound Care: Routine pressure-relief measures.   --Monitor right elbow for drainage/healing. 7. Fluids/Electrolytes/Nutrition: Monitor I/os.  Continue Ensure supplements.             -- Add vitamin C/D3, MVI, zinc and Juven. 8. Pelvic ring fracture s/p percutaneous fixation: Strict NWB RLE and WBAT LLE for transfers only.  -- hardware loosening noted, NWB b/l LE, slide board transfers  -Air mattress for wound prevention 9. Right elbow wound drainage: Now on doxycycline.  Reports night sweats.  Cultures sent -Discussed with Dr. Jena Gauss yesterday who recommended Doxycycline 100 mg twice daily, his team plans to follow-up and check on him and CIR.   -10/25 wound VAC started by Ortho appreciate assistance, continue current antibiotics, will recheck labs tomorrow -05/16/23 BCx and elbow Cx NGTD x3 days, WBC down to 10.4 today; monitor-- still NGTD 05/17/23 -10/28 WBC 12.0 today, continue doxycycline and wound VAC -10/30 wound vac removed by ortho -10/31 wound with small amount of draining, continue to monitor and continue abx, WBC down to 10.9 -11/1 orthopedics contacted regarding wound, continue antibiotics and monitor for now 10. Acute blood loss anemia: Monitor for any signs of bleeding.  Hgb stable 10.1 on 05/21/23 11. Leukocytosis: White count trending down from 23.6-->12.9>> 10.1 10/26; monitor routinely 12.  Thrombocytosis: Likely reactive.   Continue to monitor.  446 on 10/28 13. Low protein stores: Albumin down to 2.2--supplements added. 14. H/o NICM/HF s/p ICD:  Daily weights/monitor for signs of overload. Change diet to heart healthy.  No signs of fluid overload -- On Coreg 25mg  BID, aldactone 25mg  QD, Lasix 20mg  BID, and Pravachol 80mg  daily, Zetia 10mg  daily             --decrease Kdur to 20 meq bid as K trending up.  --Repeat chest x-ray in a.m.- no acute findings -10/29 recheck chest x-ray today- no acute changes -11/1 patient denies any shortness of breath, reports cough and congestion is better today -11/2-3/24 BPs a little up, monitor trend; wt stable Filed Weights   05/22/23 0439 05/23/23 0423 05/24/23 0406  Weight: 109.2 kg 109.2 kg 110.6 kg   Vitals:   05/21/23 0738 05/21/23 1305 05/21/23 1403 05/21/23 1931  BP: (!) 155/64 129/60 (!) 147/62 (!) 114/55   05/22/23 0530 05/22/23 0859 05/22/23 1407 05/22/23 1913  BP: 135/67 (!) 140/68 (!) 140/75 (!) 140/67   05/23/23 0512 05/23/23 1614 05/23/23 1944 05/24/23 0505  BP: (!) 140/75 (!) 141/55 124/65 (!) 149/68     15. Multiple rib fractures: Encourage pulmonary hygiene. Incentive spirometer ordered  -10/29 patient bringing up more mucus today, may be due to the Mucinex   16. Urinary retention: Monitor voiding with PVR checks.  continue Flomax 0.8mg  daily.  -10/24 Required IC this AM, continue to monitor  PVRs  -Timed voids every 4 hours, consider UA if urinary symptoms worsen -05/16/23 PVRs decreasing, hasn't needed cath since yesterday; monitor -05/17/23 needed cath last night for and in the afternoon yesterday; defer to weekday team regarding management of this -10/31 intermittently incontinent of bladder, suspect limited mobility is contributing.  Elevated bladder scan noted by nursing when I came in the room.  He denied sensation of needing to void.  We discussed offering him a urinal to try to void.  Appears he had a PVR later in the morning with 9  mL.  Timed voids ordered.   17. Constipation. Appears to be having regular Bms.   -Cont miralax prn -05/17/23 last BM this morning  but loose, no stool softeners being given, likely from doxycycline, monitor for ongoing diarrhea -11/1 LBM yesterday but small, will start MiraLAX daily, sorbitol as needed ordered -05/23/23 LBM yesterday but pt reports no good one in a couple days, will increase miralax BID for now.  -05/24/23 LBM this morning, cont regimen   LOS: 11 days A FACE TO FACE EVALUATION WAS PERFORMED  [address] 05/24/2023, 7:49 AM

## 2023-05-25 ENCOUNTER — Inpatient Hospital Stay (HOSPITAL_COMMUNITY): Payer: BC Managed Care – PPO

## 2023-05-25 DIAGNOSIS — F329 Major depressive disorder, single episode, unspecified: Secondary | ICD-10-CM | POA: Diagnosis not present

## 2023-05-25 DIAGNOSIS — K5900 Constipation, unspecified: Secondary | ICD-10-CM | POA: Diagnosis not present

## 2023-05-25 DIAGNOSIS — T1490XA Injury, unspecified, initial encounter: Secondary | ICD-10-CM | POA: Diagnosis not present

## 2023-05-25 DIAGNOSIS — F5101 Primary insomnia: Secondary | ICD-10-CM | POA: Diagnosis not present

## 2023-05-25 DIAGNOSIS — R1310 Dysphagia, unspecified: Secondary | ICD-10-CM

## 2023-05-25 DIAGNOSIS — S32810G Multiple fractures of pelvis with stable disruption of pelvic ring, subsequent encounter for fracture with delayed healing: Secondary | ICD-10-CM | POA: Diagnosis not present

## 2023-05-25 DIAGNOSIS — M79604 Pain in right leg: Secondary | ICD-10-CM

## 2023-05-25 LAB — BASIC METABOLIC PANEL
Anion gap: 12 (ref 5–15)
BUN: 18 mg/dL (ref 8–23)
CO2: 24 mmol/L (ref 22–32)
Calcium: 9.2 mg/dL (ref 8.9–10.3)
Chloride: 101 mmol/L (ref 98–111)
Creatinine, Ser: 0.79 mg/dL (ref 0.61–1.24)
GFR, Estimated: >60 mL/min (ref >60)
Glucose, Bld: 114 mg/dL — ABNORMAL HIGH (ref 70–99)
Potassium: 3.9 mmol/L (ref 3.5–5.1)
Sodium: 137 mmol/L (ref 135–145)

## 2023-05-25 LAB — CBC
HCT: 36.3 % — ABNORMAL LOW (ref 39.0–52.0)
Hemoglobin: 11.4 g/dL — ABNORMAL LOW (ref 13.0–17.0)
MCH: 30.9 pg (ref 26.0–34.0)
MCHC: 31.4 g/dL (ref 30.0–36.0)
MCV: 98.4 fL (ref 80.0–100.0)
Platelets: 338 10*3/uL (ref 150–400)
RBC: 3.69 MIL/uL — ABNORMAL LOW (ref 4.22–5.81)
RDW: 16.8 % — ABNORMAL HIGH (ref 11.5–15.5)
WBC: 9.2 10*3/uL (ref 4.0–10.5)
nRBC: 0 % (ref 0.0–0.2)

## 2023-05-25 MED ORDER — METHOCARBAMOL 750 MG PO TABS
750.0000 mg | ORAL_TABLET | Freq: Three times a day (TID) | ORAL | Status: DC
  Administered 2023-05-25 – 2023-06-01 (×21): 750 mg via ORAL
  Filled 2023-05-25 (×21): qty 1

## 2023-05-25 MED ORDER — QUETIAPINE FUMARATE 50 MG PO TABS
100.0000 mg | ORAL_TABLET | Freq: Every day | ORAL | Status: DC
  Administered 2023-05-25 – 2023-05-26 (×2): 100 mg via ORAL
  Filled 2023-05-25 (×2): qty 2

## 2023-05-25 MED ORDER — GABAPENTIN 300 MG PO CAPS: 300.0000 mg | ORAL_CAPSULE | Freq: Three times a day (TID) | ORAL | Status: DC

## 2023-05-25 MED ORDER — GABAPENTIN 300 MG PO CAPS
300.0000 mg | ORAL_CAPSULE | Freq: Three times a day (TID) | ORAL | Status: DC
  Administered 2023-05-25 – 2023-06-01 (×21): 300 mg via ORAL
  Filled 2023-05-25 (×21): qty 1

## 2023-05-25 MED ORDER — GABAPENTIN 400 MG PO CAPS: 400.0000 mg | ORAL_CAPSULE | Freq: Three times a day (TID) | ORAL | Status: DC

## 2023-05-25 NOTE — Progress Notes (Signed)
Physical Therapy Session Note  Patient Details  Name: Louis Juarez. MRN: [id number] Date of Birth: October 05, 1940  Today's Date: 05/25/2023 PT Individual Time: 1300-1344 PT Individual Time Calculation (min): 44 min   Short Term Goals: Week 2:  PT Short Term Goal 1 (Week 2): pt will perform supine to sit and sit to supine with min A or less consistently PT Short Term Goal 2 (Week 2): pt will perform slide board transfer with mod A or less consistently PT Short Term Goal 3 (Week 2): Pt will perform dynamic sitting balance with min A or less  Skilled Therapeutic Interventions/Progress Updates:      Pt seated in WC upon arrival. Pt reports 7-8/10 pain in R ankle and hip, premedicated.   Pt self propelled WC 150+ feet with B UE and mod I. Pt requesting to get back in bed. Pt performed slide board transfer WC to hospital bed to L with mod A for trunk/postural control and maintenance of B LE NWBing precautions, verbal cues provided for technique.   Pt incontinent of bladder with slide board transfer. Pt performed rolling bilaterally with use of bed rail and supervision. Pt performed pericare and donning and doffing brief with total A.   Pt performed the following therex for B LE strength/ROM and contracture prevention. Pt denies any increase in pain with therex.    1x10 B LE quad sets  1x10 R LE hip internal rotation to neutral  1x20 B ankle pumps, active assisted R LE.   1x10 B hip abduction, active assisted hip abduction.   Therapist attempted to donn R LE PRAFO however unable to find in room. Notified interdisciplinary team, as pt needs R LE PRAFO for contracture prevention.   Pt supine in bed with needs within reach and bed alarm on.   Therapy Documentation Precautions:  Precautions Precautions: Fall Restrictions Weight Bearing Restrictions: Yes RUE Weight Bearing: Weight bearing as tolerated RLE Weight Bearing: Non weight bearing LLE Weight Bearing: Weight bearing as  tolerated Other Position/Activity Restrictions: NWB BLE  Therapy/Group: Individual Therapy  [facility] [name], [place], DPT  05/25/2023, 1:06 PM

## 2023-05-25 NOTE — Progress Notes (Signed)
PROGRESS NOTE   Subjective/Complaints:  Patient reports having some pain in his thigh and right lower leg burning pain today.  Reports he feels warm at the moment, has several blankets over him.  Patient noted to have difficulty swallowing yesterday.  Reports he felt that food got stuck in his throat.   ROS: Patient denies fever, rash, sore throat, blurred vision, dizziness, nausea, vomiting, diarrhea, cough, shortness of breath  headache, or mood change.  + Right pelvis area pain/ R lower leg pain- continued + Insomnia-continued + Chest congestion and cough-improved + Urinary retention + Constipation-improved + Dysphagia  Objective:   No results found. Recent Labs    05/25/23 0510  WBC 9.2  HGB 11.4*  HCT 36.3*  PLT 338    Recent Labs    05/25/23 0510  NA 137  K 3.9  CL 101  CO2 24  GLUCOSE 114*  BUN 18  CREATININE 0.79  CALCIUM 9.2     Intake/Output Summary (Last 24 hours) at 05/25/2023 1305 Last data filed at 05/25/2023 0723 Gross per 24 hour  Intake 600 ml  Output 300 ml  Net 300 ml        Physical Exam: Vital Signs Blood pressure (!) 146/64, pulse 70, temperature 98 F (36.7 C), resp. rate 18, height 5\' 10"  (1.778 m), weight 110.7 kg, SpO2 97%.   General: No apparent distress and much happier, laying in bed, covered in blankets HEENT: MMM, EOMI Neck: Supple without JVD or lymphadenopathy Heart: Reg rate and rhythm. No m/r/g appreciated Chest: CTAB, no increased WOB, no cough today Abdomen: Soft, mildly-tender, non-distended, bowel sounds positive. Extremities: No clubbing, cyanosis, or edema. Pulses are 2+ Psych: Pt's affect is appropriate. Pt is cooperative.   PRIOR EXAM: Musculoskeletal:  Comments: Right elbow wound with small amount of drainage noted Slight b/l thigh tenderness, no significant ankle or calf tenderness Prepubic and right lower quadrant incisions C/D/I  Neurological:      Mental Status: He is oriented to person, place, and time. Follows commands. + memory deficits       Assessment/Plan: 1. Functional deficits which require 3+ hours per day of interdisciplinary therapy in a comprehensive inpatient rehab setting. Physiatrist is providing close team supervision and 24 hour management of active medical problems listed below. Physiatrist and rehab team continue to assess barriers to discharge/monitor patient progress toward functional and medical goals  Care Tool:  Bathing    Body parts bathed by patient: Right arm, Left arm, Chest, Abdomen, Face   Body parts bathed by helper: Front perineal area, Buttocks, Right upper leg, Left upper leg, Right lower leg, Left lower leg     Bathing assist Assist Level: Total Assistance - Patient < 25%     Upper Body Dressing/Undressing Upper body dressing   What is the patient wearing?: Hospital gown only    Upper body assist Assist Level: Moderate Assistance - Patient 50 - 74%    Lower Body Dressing/Undressing Lower body dressing      What is the patient wearing?: Hospital gown only, Incontinence brief     Lower body assist Assist for lower body dressing: Dependent - Patient 0%     Toileting  Toileting    Toileting assist Assist for toileting: Dependent - Patient 0%     Transfers Chair/bed transfer  Transfers assist     Chair/bed transfer assist level: Minimal Assistance - Patient > 75% (slide board B LE NWB +2 min A)     Locomotion Ambulation   Ambulation assist   Ambulation activity did not occur: Safety/medical concerns          Walk 10 feet activity   Assist  Walk 10 feet activity did not occur: Safety/medical concerns        Walk 50 feet activity   Assist Walk 50 feet with 2 turns activity did not occur: Safety/medical concerns         Walk 150 feet activity   Assist Walk 150 feet activity did not occur: Safety/medical concerns         Walk 10 feet on  uneven surface  activity   Assist Walk 10 feet on uneven surfaces activity did not occur: Safety/medical concerns         Wheelchair     Assist Is the patient using a wheelchair?: Yes Type of Wheelchair: Manual    Wheelchair assist level: Supervision/Verbal cueing Max wheelchair distance: 150    Wheelchair 50 feet with 2 turns activity    Assist        Assist Level: Supervision/Verbal cueing   Wheelchair 150 feet activity     Assist      Assist Level: Supervision/Verbal cueing   Blood pressure (!) 146/64, pulse 70, temperature 98 F (36.7 C), resp. rate 18, height 5\' 10"  (1.778 m), weight 110.7 kg, SpO2 97%.  Medical Problem List and Plan: 1. Functional deficits secondary to polytrauma             -patient may shower             -ELOS/Goals: 14-20 days S with PT/OT             -Continue CIR -SLP consult for cognition-felt to be at baseline, slums score of 21 out of 30 -Team conference today please see physician documentation under team conference tab, met with team  to discuss problems,progress, and goals. Formulized individual treatment plan based on medical history, underlying problem and comorbidities.  --Dr. Jena Gauss rec NWB, slide board transfers, only 1 session of therapy a day.  Recommended consideration of SNF, patient is not interested SNF.  He has several family members coming to help -QD therapy  -Patient seen by recreational therapy today  2.  Antithrombotics: -DVT/anticoagulation:  Pharmaceutical: Lovenox 30mg  BID, Eliquis 2.5 mg twice daily for 30 days on discharge for DVT prophylaxis             -antiplatelet therapy: N/A 3. Pain Management: continue tylenol 650mg  q6h, robaxin 1000mg  q8h, Oxycodone as needed severe pain -10/25 Will schedule oxycodone 5 mg at 7 AM, gabapentin 100 mg 3 times daily started -05/23/23 mildly sore throat, cepacol ordered -Gabapentin increased to 300 mg 3 times daily 11/4, Xray Pelvix ordered, Robaxin dose was  decreased to 750 mg 4. Mood/Behavior/Sleep: LCSW to follow for evaluation and support             -antipsychotic agents: Seroquel 50mg  as needed  -Lorazepam 0.5mg  QHS -05/16/23 sleeping fair but wants to try increased melatonin dose, inc to 5mg  at bedtime, monitor sleep for now-- improved 05/17/23! -05/18/2023 will increase Seroquel to 75 mg at bedtime -10/31 Increase seroquel to 100mg  HS  -Sleep-wake chart ordered -05/23/23 didn't sleep well but  didn't get seroquel-- wrote it down for him to ask, discussed with nursing to also ask if he wants it tonight-- helped 05/24/23! -05/25/23 new order placed for sleep-wake chart 5. Neuropsych/cognition: This patient is  capable of making decisions on his own behalf. 6. Skin/Wound Care: Routine pressure-relief measures.   --Monitor right elbow for drainage/healing. 7. Fluids/Electrolytes/Nutrition: Monitor I/os.  Continue Ensure supplements.             -- Add vitamin C/D3, MVI, zinc and Juven. 8. Pelvic ring fracture s/p percutaneous fixation: Strict NWB RLE and WBAT LLE for transfers only.  -- hardware loosening noted, NWB b/l LE, slide board transfers  -Air mattress for wound prevention 9. Right elbow wound drainage: Now on doxycycline.  Reports night sweats.  Cultures sent -Discussed with Dr. Jena Gauss yesterday who recommended Doxycycline 100 mg twice daily, his team plans to follow-up and check on him and CIR.   -10/25 wound VAC started by Ortho appreciate assistance, continue current antibiotics, will recheck labs tomorrow -05/16/23 BCx and elbow Cx NGTD x3 days, WBC down to 10.4 today; monitor-- still NGTD 05/17/23 -10/28 WBC 12.0 today, continue doxycycline and wound VAC -10/30 wound vac removed by ortho -10/31 wound with small amount of draining, continue to monitor and continue abx, WBC down to 10.9 -11/1 orthopedics contacted regarding wound, continue antibiotics and monitor for now 10. Acute blood loss anemia: Monitor for any signs of  bleeding.  Hgb stable 10.1 on 05/21/23 11. Leukocytosis: White count trending down from 23.6-->12.9>> 10.1 10/26; monitor routinely 12.  Thrombocytosis: Likely reactive.  Continue to monitor.  446 on 10/28 13. Low protein stores: Albumin down to 2.2--supplements added. 14. H/o NICM/HF s/p ICD:  Daily weights/monitor for signs of overload. Change diet to heart healthy.  No signs of fluid overload -- On Coreg 25mg  BID, aldactone 25mg  QD, Lasix 20mg  BID, and Pravachol 80mg  daily, Zetia 10mg  daily             --decrease Kdur to 20 meq bid as K trending up.  --Repeat chest x-ray in a.m.- no acute findings -10/29 recheck chest x-ray today- no acute changes -11/1 patient denies any shortness of breath, reports cough and congestion is better today -11/4 intermittently BP elevated but overall controlled, continue current regimen Filed Weights   05/23/23 0423 05/24/23 0406 05/25/23 0600  Weight: 109.2 kg 110.6 kg 110.7 kg   Vitals:   05/22/23 0530 05/22/23 0859 05/22/23 1407 05/22/23 1913  BP: 135/67 (!) 140/68 (!) 140/75 (!) 140/67   05/23/23 0512 05/23/23 1614 05/23/23 1944 05/24/23 0505  BP: (!) 140/75 (!) 141/55 124/65 (!) 149/68   05/24/23 1306 05/24/23 1648 05/24/23 2009 05/25/23 0600  BP: (!) 113/58 (!) 155/75 133/68 (!) 146/64     15. Multiple rib fractures: Encourage pulmonary hygiene. Incentive spirometer ordered  -10/29 patient bringing up more mucus today, may be due to the Mucinex   16. Urinary retention: Monitor voiding with PVR checks.  continue Flomax 0.8mg  daily.  -10/24 Required IC this AM, continue to monitor  PVRs  -Timed voids every 4 hours, consider UA if urinary symptoms worsen -05/16/23 PVRs decreasing, hasn't needed cath since yesterday; monitor -05/17/23 needed cath last night for and in the afternoon yesterday; defer to weekday team regarding management of this -10/31 intermittently incontinent of bladder, suspect limited mobility is contributing.   Elevated bladder scan noted by nursing when I came in the room.  He denied sensation of needing to void.  We discussed offering him  a urinal to try to void.  Appears he had a PVR later in the morning with 9 mL.  Timed voids ordered. -11/4 appears to be doing better, documented as continent last couple days and has not needed IC during this time   17. Constipation. Appears to be having regular Bms.   -Cont miralax prn -05/17/23 last BM this morning but loose, no stool softeners being given, likely from doxycycline, monitor for ongoing diarrhea -11/1 LBM yesterday but small, will start MiraLAX daily, sorbitol as needed ordered -05/23/23 LBM yesterday but pt reports no good one in a couple days, will increase miralax BID for now.  -05/24/23 LBM this morning, cont regimen -05/25/23 last BM yesterday continue to monitor  18.  Dysphagia.  SLP consult placed.  Diet decreased to D3 for now   LOS: 12 days A FACE TO FACE EVALUATION WAS PERFORMED  Fanny Dance 05/25/2023, 1:05 PM

## 2023-05-25 NOTE — Consult Note (Addendum)
Neuropsychological Consultation Comprehensive Inpatient Rehab   Patient:   Louis Juarez.   DOB:   [birth year]  MR Number:  [id number]  Location:  [facility] [address] CENTER A [address] [place] [address] Dept: [phone] Loc: [phone]           Date of Service:   05/25/2023  Start Time:   8 AM End Time:   9 AM  Provider/Observer:  Arley Phenix, Psy.D.       Clinical Neuropsychologist       Billing Code/Service: [phone]  Reason for Service:    [name] is an 82 year old male referred for neuropsychological consultation during his ongoing admission onto the comprehensive inpatient rehabilitation unit.  Patient has had some frustration and depressive type responses following his trauma.  Patient has a past medical history including type 2 diabetes, NICM, hypertension, ICM/CHF.  Patient was admitted on 04/24/2023 after falling approximately 20 feet off a ladder while trimming a tree with a chainsaw.  Patient suffered right elbow, pelvic and thoracic injuries.  Neurosurgery felt a conservative approach would be best regarding his thoracic injuries.  Patient was taken to the OR for pelvic injury.  Patient has struggled with significant pain during his hospital stay and during visit today described significant pain particularly at night and not being able to sleep at night.  Patient reports that he is at his baseline from a cognitive standpoint.  During today's clinical interview the patient was oriented x 4 and aware of his current medical status and able to describe clearly the events of his fall and injury.  Patient denied any nightmares or flashbacks from the injury but reports knowing as soon as he hit the ground that he knew that he had fractured his back but was not immediately aware of the extent all of his injuries.  Patient reports that his biggest difficulty is lack of sleep at night and that he tosses and  turns due to pain.  Patient had question about the potential for at least a little bit more aggressive treatment of his pain.  Patient acknowledges feeling rather depressed and at various times during our discussion today asked a question about whether "all of this was worth it" regarding efforts for his recovery.  We addressed this issue.  Patient denied any suicidal ideation but just frustration about whether the pain and difficulties he is going through regard to be worth it in the long-term as far as recovery.  We specifically addressed these issues and talked about what to expect and the fact that he was going to be making improvements from where he was at this current time and his it mission time in the hospital should not be the situation be making those judgments and expectations about what is going to be happening in the future.  HPI for the current admission:    HPI:  Louis Juarez is an 82 year old male with history of T2DM, NICM, HTN, ICM/CHF s/p AICD who was admitted on 04/24/23 after falling 20 feed off a ladder with complaints of right elbow, right hip pain and was hypotensive in ED. He was found to have unstable pelvic ring fracture with sacral Fx on left, posterior ilium and superior pubic rami fracture on right, right elbow laceration with air in ulnohumeral joint and small comminuted fracture fragments of olecranon tip, right 6-12 rib fractures, right T10 transverse process fracture, hemorrhagic shock, age-indeterminate mild  compression fractures of T4, T7 and T12 as well as incidental findings of pulmonary nodules and severe atherosclerotic calcification left carotid bulb and proximal left cervical ICA.  Dr. Dutch Quint evaluated the films and felt that no brace or spine precautions needed.Marland Kitchen  He was taken to OR the same day for percutaneous fixation of right posterior pelvic fractur fracture, left sacrum and right superior pubic rami fracture, open treatment of right olecranon avulsion  fracture, I&D right open olecranon fracture and I&D of her right open elbow arthrotomy by Dr. Jena Gauss.  Postop had issues with hypotension, acute blood loss anemia requiring transfusion, urinary retention requiring Foley placement briefly as well as development of hypoxia with decrease in level of consciousness on 10/07.  Head CT was negative for acute changes albumin bolus for hypotension as well as Levophed.Marland Kitchen   He reported worsening of back pain and flank pain bilaterally as well as hematuria and foley replaced.  CT abdomen pelvis repeated on 10/12 due to rising white count as well as abdominal pain and hematuria.  This revealed thickening of descending and proximal sigmoid colon compatible with acute colitis, increased displacement of right iliac wing fracture, increased moderate right pleural effusions as well as groundglass opacities BLE and patchy consolidation in right base.  Incidental note made of 2.7 cm hemangioma left lobe of liver as well as subcutaneous edema diffusely right greater than left.  He was started on IV Zosyn on 10/13 for possible colitis as well as pulmonary toilet.  Fluid overload treated with Lasix. He was taken back to OR for ORIF pubic symphysis with right superior pubic rami's, ORIF fixation of right posterior pelvic/SI joint and I&D of right elbow infection on 10/18.  Postop to be strict NWB RLE and WBAT LLE for transfers only.  He is WBAT RUE.   ST elevation with electrolyte abnormalities being monitored.  He was maintained on IV antibiotics through 10/22.  He continues to have drainage from right elbow with recommendations to continue antibiotics..  Mentation has improved and leukocytosis is resolving with white count trending down. He is continues to require mod assist with difficulty remembering WB status. CIR recommended due to functional decline. C/o weakness.   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 Bilateral pleural effusions,  Question mild subpleural reticulation, indicative of fibrosis.  Coronary artery calcification. 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 Wall thickness was increased in a pattern of mild LVH.- Aortic valve: AV is thckened, calcified with no signifi stenosis.The atrium was severely dilated.: Systolic function was moderately reduced.The atrium was mildly dilated.PA peak pre   Nonischemic cardiomyopathy (HCC)    Palpitations    Pneumonia    PVC (premature ventricular contraction) 07/02/2011   PVC's (premature ventricular contractions)    Wheezing 04/29/2011   Sinus ct 05/2011:    Wheezing          Patient Active Problem List   Diagnosis Date Noted   Depressive reaction 05/25/2023   Urinary retention 05/19/2023   Trauma 05/13/2023   Pelvic  fracture (HCC) 04/24/2023   Heart palpitations 04/14/2012   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  presents as a 82 y.o.-year-old Right handed Caucasian Male who appeared his stated age. his dress was Appropriate and he was Well Groomed and his manners were Appropriate to the situation.  his participation was indicative of Appropriate and Attentive behaviors.  There were physical disabilities noted.  he displayed an appropriate level of cooperation and motivation.    Interactions:    Active Appropriate  Attention:   within normal limits and attention span and concentration were age appropriate  Memory:   within normal limits; recent and remote memory intact  Visuo-spatial:   not examined  Speech (Volume):  low  Speech:   normal; normal  Thought Process:  Coherent and Relevant  Coherent, Logical, and Organized  Though Content:  WNL; not suicidal and not homicidal  Orientation:   person, place, time/date, and situation  Judgment:   Fair  Planning:   Fair  Affect:    Anxious and Depressed  Mood:    Anxious and Dysphoric  Insight:   Good  Intelligence:   normal  Psychiatric History:  History of anxiety with exacerbation of anxiety with new significant pain.  Family Med/Psych History:  Family History  Problem Relation Age of Onset   Colon cancer Father    Melanoma Mother    Melanoma Sister     Risk of Suicide/Violence: low patient denies any suicidal ideation even though he has questioned the benefit of recovery efforts.    Impression/DX:   Ennis Heavner. is an  82 year old male referred for neuropsychological consultation during his ongoing admission onto the comprehensive inpatient rehabilitation unit.  Patient has had some frustration and depressive type responses following his trauma.  Patient has a past medical history including type 2 diabetes, NICM, hypertension, ICM/CHF.  Patient was admitted on 04/24/2023 after falling approximately 20 feet off a ladder while trimming a tree with a chainsaw.  Patient suffered right elbow, pelvic and thoracic injuries.  Neurosurgery felt a conservative approach would be best regarding his thoracic injuries.  Patient was taken to the OR for pelvic injury.  Patient has struggled with significant pain during his hospital stay and during visit today described significant pain particularly at night and not being able to sleep at night.  Patient reports that he is at  his baseline from a cognitive standpoint.  During today's clinical interview the patient was oriented x 4 and aware of his current medical status and able to describe clearly the events of his fall and injury.  Patient denied any nightmares or flashbacks from the injury but reports knowing as soon as he hit the ground that he knew that he had fractured his back but was not immediately aware of the extent all of his injuries.  Patient reports that his biggest difficulty is lack of sleep at night and that he tosses and turns due to pain.  Patient had question about the potential for at least a little bit more aggressive treatment of his pain.  Patient acknowledges feeling rather depressed and at various times during our discussion today asked a question about whether "all of this was worth it" regarding efforts for his recovery.  We addressed this issue.  Patient denied any suicidal ideation but just frustration about whether the pain and difficulties he is going through regard to be worth it in the long-term as far as recovery.  We specifically addressed these issues and  talked about what to expect and the fact that he was going to be making improvements from where he was at this current time and his it mission time in the hospital should not be the situation be making those judgments and expectations about what is going to be happening in the future.   Diagnosis:    Reactive depression         Electronically Signed   _______________________ Arley Phenix, Psy.D. Clinical Neuropsychologist

## 2023-05-25 NOTE — Progress Notes (Addendum)
Occupational Therapy Session Note  Patient Details  Name: Louis Juarez. MRN: [id number] Date of Birth: [birth year]  Today's Date: 05/25/2023 OT Individual Time: 1120-1200 OT Individual Time Calculation (min): 40 min    Short Term Goals: Week 2:  OT Short Term Goal 1 (Week 2): Pt will perform squat-pivot transfer to BSC/toilet with consistent Min A. OT Short Term Goal 2 (Week 2): Pt will manage LB dressing with consistent Mod A + LRAD. OT Short Term Goal 3 (Week 2): Pt will complete LB bathing with consistent Min A + LRAD.  Skilled Therapeutic Interventions/Progress Updates:  Pt received resting in bed for skilled OT session with focus on LB dressing and functional transfers. Pt agreeable to interventions, demonstrating overall pleasant mood. Pt with un-rated pain, increased with mobility, pre-medicated. OT offering intermediate rest breaks and positioning suggestions throughout session to address pain/fatigue and maximize participation/safety in session.   At bed-level, pt requires A for threading of BLE into shorts for maintaining integrity of pelvic structures, with cuing pt able to roll R/L to hike shorts over bottom/hips. Pt comes to EOB with supervision/cuing, performing slide-board transfer from [facility] with A for placement and elevation of BLE for precaution maintenance.   OT and patient discuss plan for TTB and [facility] practice. Spouse verifying a TTB has been purchased. Pt continues to voice concerns with pelvic hardware shifting, stating "I feel like its moving in there." MD/PA updated.   Pt remained sitting WC with all immediate needs met at end of session. Pt continues to be appropriate for skilled OT intervention to promote further functional independence.   Therapy Documentation Precautions:  Precautions Precautions: Fall Restrictions Weight Bearing Restrictions: Yes RUE Weight Bearing: Weight bearing as tolerated RLE Weight Bearing: Non weight bearing LLE Weight Bearing:  Weight bearing as tolerated Other Position/Activity Restrictions: NWB BLE   Therapy/Group: Individual Therapy  [name], OTR/L, MSOT  05/25/2023, 5:48 AM

## 2023-05-26 DIAGNOSIS — F411 Generalized anxiety disorder: Secondary | ICD-10-CM

## 2023-05-26 MED ORDER — LORAZEPAM 0.5 MG PO TABS
1.0000 mg | ORAL_TABLET | Freq: Every day | ORAL | Status: DC
  Administered 2023-05-26 – 2023-05-31 (×6): 1 mg via ORAL
  Filled 2023-05-26 (×6): qty 2

## 2023-05-26 NOTE — Plan of Care (Signed)
  Problem: RH Swallowing Goal: LTG Patient will consume least restrictive diet using compensatory strategies with assistance (SLP) Description: LTG:  Patient will consume least restrictive diet using compensatory strategies with assistance (SLP) Flowsheets (Taken 05/26/2023 0834) LTG: Pt Patient will consume least restrictive diet using compensatory strategies with assistance of (SLP): Supervision

## 2023-05-26 NOTE — Consult Note (Signed)
Neuropsychological Consultation Comprehensive Inpatient Rehab   Patient:   Louis Juarez.   DOB:   Aug 10, 1940  MR Number:  [id number]  Location:  [facility] [address] CENTER A [address] [place] [address] Dept: [phone] Loc: [phone]           Date of Service:   05/26/2023  Start Time:   2 PM End Time:   3 PM  Provider/Observer:  Arley Phenix, Psy.D.       Clinical Neuropsychologist       Billing Code/Service: [phone]  Reason for Service:    Louis Juarez. Juarez an 82 year old male referred for neuropsychological consultation during his ongoing admission onto the comprehensive inpatient rehabilitation unit.  Patient has had some frustration and depressive type responses following his trauma.  Patient has a past medical history including type 2 diabetes, NICM, hypertension, ICM/CHF.  Patient was admitted on 04/24/2023 after falling approximately 20 feet off a ladder while trimming a tree with a chainsaw.  Patient suffered right elbow, pelvic and thoracic injuries.  Neurosurgery felt a conservative approach would be best regarding his thoracic injuries.  Patient was taken to the OR for pelvic injury.  Patient has struggled with significant pain during his hospital stay and during visit today described significant pain particularly at night and not being able to sleep at night.  Patient reports that he Juarez at his baseline from a cognitive standpoint.  Follow-up clinical interview:  Today patient reported improvement in pain and less anxiety.  He reports anxiety better but still having impact, especially with onset of sleep.  Aware we are working on medications to improve that.  Patient feeling positive today about expectations for improvements.  Yesterday patient expressed concern about whether or not all these efforts were even "worth it."  Today was much more positive and patient talked about continuing to be active at  his home but not doing things like tree trimming or other risky activities.    HPI for the current admission:    HPI:  Louis Juarez an 82 year old male with history of T2DM, NICM, HTN, ICM/CHF s/p AICD who was admitted on 04/24/23 after falling 20 feed off a ladder with complaints of right elbow, right hip pain and was hypotensive in ED. He was found to have unstable pelvic ring fracture with sacral Fx on left, posterior ilium and superior pubic rami fracture on right, right elbow laceration with air in ulnohumeral joint and small comminuted fracture fragments of olecranon tip, right 6-12 rib fractures, right T10 transverse process fracture, hemorrhagic shock, age-indeterminate mild compression fractures of T4, T7 and T12 as well as incidental findings of pulmonary nodules and severe atherosclerotic calcification left carotid bulb and proximal left cervical ICA.  Dr. Dutch Quint evaluated the films and felt that no brace or spine precautions needed.Marland Kitchen  He was taken to OR the same day for percutaneous fixation of right posterior pelvic fractur fracture, left sacrum and right superior pubic rami fracture, open treatment of right olecranon avulsion fracture, I&D right open olecranon fracture and I&D of her right open elbow arthrotomy by Dr. Jena Gauss.  Postop had issues with hypotension, acute blood loss anemia requiring transfusion, urinary retention requiring Foley placement briefly as well as development of hypoxia with decrease in level of consciousness on 10/07.  Head CT was negative for acute changes albumin bolus for hypotension as well as Levophed.Marland Kitchen   He reported worsening of  back pain and flank pain bilaterally as well as hematuria and foley replaced.  CT abdomen pelvis repeated on 10/12 due to rising white count as well as abdominal pain and hematuria.  This revealed thickening of descending and proximal sigmoid colon compatible with acute colitis, increased displacement of right iliac wing fracture,  increased moderate right pleural effusions as well as groundglass opacities BLE and patchy consolidation in right base.  Incidental note made of 2.7 cm hemangioma left lobe of liver as well as subcutaneous edema diffusely right greater than left.  He was started on IV Zosyn on 10/13 for possible colitis as well as pulmonary toilet.  Fluid overload treated with Lasix. He was taken back to OR for ORIF pubic symphysis with right superior pubic rami's, ORIF fixation of right posterior pelvic/SI joint and I&D of right elbow infection on 10/18.  Postop to be strict NWB RLE and WBAT LLE for transfers only.  He Juarez WBAT RUE.   ST elevation with electrolyte abnormalities being monitored.  He was maintained on IV antibiotics through 10/22.  He continues to have drainage from right elbow with recommendations to continue antibiotics..  Mentation has improved and leukocytosis Juarez resolving with white count trending down. He Juarez continues to require mod assist with difficulty remembering WB status. CIR recommended due to functional decline. C/o weakness.   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 Bilateral pleural effusions,  Question mild subpleural reticulation, indicative of fibrosis.  Coronary artery calcification. 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 Wall thickness was increased in a pattern of mild LVH.- Aortic valve: AV Juarez thckened, calcified with no signifi stenosis.The atrium was severely dilated.: Systolic function was moderately reduced.The atrium was mildly dilated.PA peak pre   Nonischemic cardiomyopathy (HCC)    Palpitations    Pneumonia    PVC (premature ventricular contraction) 07/02/2011   PVC's (premature ventricular contractions)    Wheezing 04/29/2011   Sinus ct 05/2011:    Wheezing          Patient Active Problem List   Diagnosis Date Noted   Generalized anxiety disorder 05/26/2023   Depressive reaction 05/25/2023   Urinary retention 05/19/2023   Trauma 05/13/2023   Pelvic fracture (HCC)  04/24/2023   Heart palpitations 04/14/2012   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  presents as a 82 y.o.-year-old Right handed Caucasian Male who appeared his stated age. his dress was Appropriate and he was Well Groomed and his manners were Appropriate to the situation.  his participation was  indicative of Appropriate and Attentive behaviors.  There were physical disabilities noted.  he displayed an appropriate level of cooperation and motivation.    Interactions:    Active Appropriate  Attention:   within normal limits and attention span and concentration were age appropriate  Memory:   within normal limits; recent and remote memory intact  Visuo-spatial:   not examined  Speech (Volume):  low  Speech:   normal; normal  Thought Process:  Coherent and Relevant  Coherent, Logical, and Organized  Though Content:  WNL; not suicidal and not homicidal  Orientation:   person, place, time/date, and situation  Judgment:   Fair  Planning:   Fair  Affect:    Anxious  Mood:    Anxious  Insight:   Good  Intelligence:   normal  Psychiatric History:  History of anxiety with exacerbation of anxiety with new significant pain.  Family Med/Psych History:  Family History  Problem Relation Age of Onset   Colon cancer Father    Melanoma Mother    Melanoma Sister     Risk of Suicide/Violence: low patient denies any suicidal ideation even though he has questioned the benefit of recovery efforts.    Impression/DX:   Louis Juarez. Juarez an 82 year old male referred for neuropsychological consultation during his ongoing admission onto the comprehensive inpatient rehabilitation unit.  Patient has had some frustration and depressive type responses following his trauma.  Patient has a past medical history including type 2 diabetes, NICM, hypertension, ICM/CHF.  Patient was admitted on 04/24/2023 after falling approximately 20 feet off a ladder while trimming a tree with a chainsaw.  Patient suffered right elbow, pelvic and thoracic injuries.  Neurosurgery felt a conservative approach would be best regarding his thoracic injuries.  Patient was taken to the OR for pelvic injury.  Patient has struggled with significant pain during his hospital stay and during visit today described  significant pain particularly at night and not being able to sleep at night.  Patient reports that he Juarez at his baseline from a cognitive standpoint.  Follow-up clinical interview:  Today patient reported improvement in pain and less anxiety.  He reports anxiety better but still having impact, especially with onset of sleep.  Aware we are working on medications to improve that.  Patient feeling positive today about expectations for improvements.  Yesterday patient expressed concern about whether or not all these efforts were even "worth it."  Today was much more positive and patient talked about continuing to be active at his home but not doing things like tree trimming or other risky activities.    Diagnosis:    Reactive depression         Electronically Signed   _______________________ Arley Phenix, Psy.D. Clinical Neuropsychologist

## 2023-05-26 NOTE — Progress Notes (Signed)
Physical Therapy Session Note  Patient Details  Name: Louis Juarez. MRN: [id number] Date of Birth: Jun 03, 1941  Today's Date: 05/26/2023 PT Individual Time: 7829-5621 PT Individual Time Calculation (min): 29 min   Short Term Goals: Week 1:  PT Short Term Goal 1 (Week 1): Pt will complete bed mobility with modA. PT Short Term Goal 1 - Progress (Week 1): Met PT Short Term Goal 2 (Week 1): Pt will complete bed to chair transfer with modA. PT Short Term Goal 2 - Progress (Week 1): Met PT Short Term Goal 3 (Week 1): Pt will accurately recall and maintain WB precautions. PT Short Term Goal 3 - Progress (Week 1): Met  Skilled Therapeutic Interventions/Progress Updates:      Pt supine in bed upon arrival. Pt agreeable to therapy. Pt denies any pain throughout session.   Therapist followed up with charge nurse, MD and PA for R LE PRAFO. If unable to find today, plan to order new one.   Pt incontinent of bowel and bladder upon arrival. Pt performed rolling B with use of bedrails an Pt attempted to urinate with use of urinal to reduce risk of incontinence with transfer however pt unable.   Pt unable to recall B NWBing precautions. Education provided for B NWBing precautions.   Supine to sit with use of bed features and supervision, pt demos recall of technique.   Pt performed slide board transfer hospital bed to [facility] with min A from elevated hospital bed for maintenance of precautions.   Pt seated in [facility] with all needs within reach and seatbelt alarm on.         Therapy Documentation Precautions:  Precautions Precautions: Fall Restrictions Weight Bearing Restrictions: Yes RUE Weight Bearing: Weight bearing as tolerated RLE Weight Bearing: Non weight bearing LLE Weight Bearing: Weight bearing as tolerated Other Position/Activity Restrictions: NWB BLE  Therapy/Group: Individual Therapy  [facility] [name], [place], DPT  05/26/2023, 7:48 AM

## 2023-05-26 NOTE — Progress Notes (Signed)
Increase nightly dose of lorazepam to 1mg  for insomnia per Dr. Natale Lay.   Ulyses Southward, PharmD, BCIDP, AAHIVP, CPP Infectious Disease Pharmacist 05/26/2023 1:02 PM

## 2023-05-26 NOTE — Progress Notes (Signed)
Occupational Therapy Session Note  Patient Details  Name: Louis Juarez. MRN: [id number] Date of Birth: 08-Apr-1941  Today's Date: 05/26/2023 OT Individual Time: 1607-3710 OT Individual Time Calculation (min): 28 min    Short Term Goals: Week 2:  OT Short Term Goal 1 (Week 2): Pt will perform squat-pivot transfer to BSC/toilet with consistent Min A. OT Short Term Goal 2 (Week 2): Pt will manage LB dressing with consistent Mod A + LRAD. OT Short Term Goal 3 (Week 2): Pt will complete LB bathing with consistent Min A + LRAD.  Skilled Therapeutic Interventions/Progress Updates:  Pt greeted resting in bed for skilled OT session with focus on functional transfers.   Pain: Pt reported 2/10 surgical pain. OT offering intermediate rest breaks and positioning suggestions throughout session to address pain/fatigue and maximize participation/safety in session.   Functional Transfers: Pt comes to EOB with bed features and supervision. Slide board transfer from EOB<>Bariatric [facility] with A for board placement and elevation of BLE NWB. Pt unable to recall BLE NWB precautions, requiring moderate levels of cuing throughout transfer to recall.   Self Care Tasks: Pt with BM incontinence, requiring Max A for pericare/brief change, able to cleanse anterior pericare with setup & encouragement.   Pt remained resting in bed with 4Ps assessed and immediate needs met. Pt continues to be appropriate for skilled OT intervention to promote further functional independence in ADLs/IADLs.   Therapy Documentation Precautions:  Precautions Precautions: Fall Restrictions Weight Bearing Restrictions: Yes RUE Weight Bearing: Weight bearing as tolerated RLE Weight Bearing: Non weight bearing LLE Weight Bearing: Weight bearing as tolerated Other Position/Activity Restrictions: NWB BLE   Therapy/Group: Individual Therapy  [name], OTR/L, MSOT  05/26/2023, 6:10 AM

## 2023-05-26 NOTE — Progress Notes (Signed)
PROGRESS NOTE   Subjective/Complaints:  Patient reports pain is better controlled today.  Today his concern is poor sleep at night.  Patient reports he feels very anxious at night and this is making it difficult for him to sleep.  LBM today   ROS: Patient denies fever, rash, sore throat, blurred vision, dizziness, nausea, vomiting, diarrhea, constipation, cough, shortness of breath  headache, or mood change.  + Right pelvis area pain/ R lower leg pain-improved + Insomnia-continued + Chest congestion and cough-improved  + Dysphagia  Objective:   DG Pelvis Comp Min 3V  Result Date: 05/25/2023 CLINICAL DATA:  Postop pain. EXAM: JUDET PELVIS - 3+ VIEW COMPARISON:  Pelvic radiograph dated 05/19/2023. FINDINGS: Extensive postoperative changes of the pelvis with fixation plate and screws as seen previously. The hardware is intact. No acute fracture or dislocation. The bones are osteopenic. 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 Degenerative changes of the visualized lower lumbar spine. The soft tissues are unremarkable. IMPRESSION: 1. No acute fracture or dislocation. 2. Stable postoperative changes of the pelvis with stable diastasis of the symphysis pubis. Electronically Signed   By: Elgie Collard M.D.   On: 05/25/2023 18:53   Recent Labs    05/25/23 0510  WBC 9.2  HGB 11.4*  HCT 36.3*  PLT 338    Recent Labs    05/25/23 0510  NA 137  K 3.9  CL 101  CO2 24  GLUCOSE 114*  BUN 18  CREATININE 0.79  CALCIUM 9.2     Intake/Output Summary (Last 24 hours) at 05/26/2023 1301 Last data filed at 05/26/2023 1144 Gross per 24 hour  Intake 200 ml  Output 500 ml  Net -300 ml        Physical Exam: Vital Signs Blood pressure 124/67, pulse 73, temperature 98.1 F (36.7 C), resp. rate 17, height 5\' 10"  (1.778 m), weight 110.7 kg, SpO2 93%.   General: No apparent distress, anxious  appearing HEENT: MMM, EOMI Neck: Supple without JVD or lymphadenopathy Heart: Reg rate and rhythm. No m/r/g appreciated Chest: CTAB, no increased WOB,  Abdomen: Soft, mildly-tender, non-distended, bowel sounds positive. Extremities: No clubbing, cyanosis, or edema. Pulses are 2+ Psych: Pt's affect is appropriate. Pt is cooperative.  Anxious appearing  PRIOR EXAM: Musculoskeletal:  Comments: Right elbow wound with small amount of drainage noted Slight b/l thigh tenderness, no significant ankle or calf tenderness Prepubic and right lower quadrant incisions C/D/I  Neurological:     Mental Status: He is oriented to person, place, and time. Follows commands. + memory deficits       Assessment/Plan: 1. Functional deficits which require 3+ hours per day of interdisciplinary therapy in a comprehensive inpatient rehab setting. Physiatrist is providing close team supervision and 24 hour management of active medical problems listed below. Physiatrist and rehab team continue to assess barriers to discharge/monitor patient progress toward functional and medical goals  Care Tool:  Bathing    Body parts bathed by patient: Right arm, Left arm, Chest, Abdomen, Face   Body parts bathed by helper: Front perineal area, Buttocks, Right upper leg, Left upper leg, Right lower leg, Left lower leg  Bathing assist Assist Level: Total Assistance - Patient < 25%     Upper Body Dressing/Undressing Upper body dressing   What is the patient wearing?: Hospital gown only    Upper body assist Assist Level: Moderate Assistance - Patient 50 - 74%    Lower Body Dressing/Undressing Lower body dressing      What is the patient wearing?: Hospital gown only, Incontinence brief     Lower body assist Assist for lower body dressing: Dependent - Patient 0%     Toileting Toileting    Toileting assist Assist for toileting: Dependent - Patient 0%     Transfers Chair/bed transfer  Transfers  assist     Chair/bed transfer assist level: Minimal Assistance - Patient > 75% (slide board B LE NWB +2 min A)     Locomotion Ambulation   Ambulation assist   Ambulation activity did not occur: Safety/medical concerns          Walk 10 feet activity   Assist  Walk 10 feet activity did not occur: Safety/medical concerns        Walk 50 feet activity   Assist Walk 50 feet with 2 turns activity did not occur: Safety/medical concerns         Walk 150 feet activity   Assist Walk 150 feet activity did not occur: Safety/medical concerns         Walk 10 feet on uneven surface  activity   Assist Walk 10 feet on uneven surfaces activity did not occur: Safety/medical concerns         Wheelchair     Assist Is the patient using a wheelchair?: Yes Type of Wheelchair: Manual    Wheelchair assist level: Supervision/Verbal cueing Max wheelchair distance: 150    Wheelchair 50 feet with 2 turns activity    Assist        Assist Level: Supervision/Verbal cueing   Wheelchair 150 feet activity     Assist      Assist Level: Supervision/Verbal cueing   Blood pressure 124/67, pulse 73, temperature 98.1 F (36.7 C), resp. rate 17, height 5\' 10"  (1.778 m), weight 110.7 kg, SpO2 93%.  Medical Problem List and Plan: 1. Functional deficits secondary to polytrauma             -patient may shower             -ELOS/Goals: 14-20 days S with PT/OT             -Continue CIR -SLP consult for cognition-felt to be at baseline, slums score of 21 out of 30 -Team conference today please see physician documentation under team conference tab, met with team  to discuss problems,progress, and goals. Formulized individual treatment plan based on medical history, underlying problem and comorbidities.  --Dr. Jena Gauss rec NWB, slide board transfers, only 1 session of therapy a day.  Recommended consideration of SNF, patient is not interested SNF.  He has several family  members coming to help -QD therapy  -Team conference tomorrow  2.  Antithrombotics: -DVT/anticoagulation:  Pharmaceutical: Lovenox 30mg  BID, Eliquis 2.5 mg twice daily for 30 days on discharge for DVT prophylaxis             -antiplatelet therapy: N/A 3. Pain Management: continue tylenol 650mg  q6h, robaxin 1000mg  q8h, Oxycodone as needed severe pain -10/25 Will schedule oxycodone 5 mg at 7 AM, gabapentin 100 mg 3 times daily started -05/23/23 mildly sore throat, cepacol ordered -Gabapentin increased to 300 mg 3 times daily 11/4,  Xray Pelvix ordered, Robaxin dose was decreased to 750 mg -05/26/23 continue current pain regimen 4. Mood/Behavior/Sleep: LCSW to follow for evaluation and support             -antipsychotic agents: Seroquel 50mg  as needed  -Lorazepam 0.5mg  QHS -05/16/23 sleeping fair but wants to try increased melatonin dose, inc to 5mg  at bedtime, monitor sleep for now-- improved 05/17/23! -05/18/2023 will increase Seroquel to 75 mg at bedtime -10/31 Increase seroquel to 100mg  HS  -Sleep-wake chart ordered -05/23/23 didn't sleep well but didn't get seroquel-- wrote it down for him to ask, discussed with nursing to also ask if he wants it tonight-- helped 05/24/23! -05/25/23 new order placed for sleep-wake chart -05/26/23 discussed sleep-wake monitoring with nursing, will increase lorazepam to 1 mg. Discussed options with pharmacy appreciate assistance 5. Neuropsych/cognition: This patient is  capable of making decisions on his own behalf. 6. Skin/Wound Care: Routine pressure-relief measures.   --Monitor right elbow for drainage/healing. 7. Fluids/Electrolytes/Nutrition: Monitor I/os.  Continue Ensure supplements.             -- Add vitamin C/D3, MVI, zinc and Juven. 8. Pelvic ring fracture s/p percutaneous fixation: Strict NWB RLE and WBAT LLE for transfers only.  -- hardware loosening noted, NWB b/l LE, slide board transfers  -Air mattress for wound prevention  -11/5 x-ray Pelvis  yesterday stable 9. Right elbow wound drainage: Now on doxycycline.  Reports night sweats.  Cultures sent -Discussed with Dr. Jena Gauss yesterday who recommended Doxycycline 100 mg twice daily, his team plans to follow-up and check on him and CIR.   -10/25 wound VAC started by Ortho appreciate assistance, continue current antibiotics, will recheck labs tomorrow -05/16/23 BCx and elbow Cx NGTD x3 days, WBC down to 10.4 today; monitor-- still NGTD 05/17/23 -10/28 WBC 12.0 today, continue doxycycline and wound VAC -10/30 wound vac removed by ortho -10/31 wound with small amount of draining, continue to monitor and continue abx, WBC down to 10.9 -11/1 orthopedics contacted regarding wound, continue antibiotics and monitor for now 10. Acute blood loss anemia: Monitor for any signs of bleeding.  Hgb stable 10.1 on 05/21/23 11. Leukocytosis: White count trending down from 23.6-->12.9>> 10.1 10/26; monitor routinely 12.  Thrombocytosis: Likely reactive.  Continue to monitor.  446 on 10/28 13. Low protein stores: Albumin down to 2.2--supplements added. 14. H/o NICM/HF s/p ICD:  Daily weights/monitor for signs of overload. Change diet to heart healthy.  No signs of fluid overload -- On Coreg 25mg  BID, aldactone 25mg  QD, Lasix 20mg  BID, and Pravachol 80mg  daily, Zetia 10mg  daily             --decrease Kdur to 20 meq bid as K trending up.  --Repeat chest x-ray in a.m.- no acute findings -10/29 recheck chest x-ray today- no acute changes -11/1 patient denies any shortness of breath, reports cough and congestion is better today -11/5 BP intermittently elevated, continue to monitor trend for now [facility] Weights   05/23/23 0423 05/24/23 0406 05/25/23 0600  Weight: 109.2 kg 110.6 kg 110.7 kg      05/26/2023    4:11 AM 05/25/2023    8:04 PM 05/25/2023    2:00 PM  Vitals with BMI  Systolic 124 150 629  Diastolic 67 63 65  Pulse 73 65 59     15. Multiple rib fractures: Encourage pulmonary hygiene. Incentive  spirometer ordered  -10/29 patient bringing up more mucus today, may be due to the Mucinex   16. Urinary retention: Monitor voiding  with PVR checks.  continue Flomax 0.8mg  daily.  -10/24 Required IC this AM, continue to monitor  PVRs  -Timed voids every 4 hours, consider UA if urinary symptoms worsen -05/16/23 PVRs decreasing, hasn't needed cath since yesterday; monitor -05/17/23 needed cath last night for and in the afternoon yesterday; defer to weekday team regarding management of this -10/31 intermittently incontinent of bladder, suspect limited mobility is contributing.  Elevated bladder scan noted by nursing when I came in the room.  He denied sensation of needing to void.  We discussed offering him a urinal to try to void.  Appears he had a PVR later in the morning with 9 mL.  Timed voids ordered. -11/5 has not required IC in couple days, continue to monitor   17. Constipation. Appears to be having regular Bms.   -Cont miralax prn -05/17/23 last BM this morning but loose, no stool softeners being given, likely from doxycycline, monitor for ongoing diarrhea -11/1 LBM yesterday but small, will start MiraLAX daily, sorbitol as needed ordered -05/23/23 LBM yesterday but pt reports no good one in a couple days, will increase miralax BID for now.  -05/24/23 LBM this morning, cont regimen -11/5 last BM today  18.  Dysphagia.  SLP consult placed.  Diet decreased to D3 for now  -SLP considering MBS   LOS: 13 days A FACE TO FACE EVALUATION WAS PERFORMED  Fanny Dance 05/26/2023, 1:01 PM

## 2023-05-26 NOTE — Evaluation (Signed)
Speech Language Pathology Bedside Swallow Evaluation  Patient Details  Name: Louis Juarez. 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 Bilateral pleural effusions,  Question mild subpleural reticulation, indicative of fibrosis.  Coronary artery calcification. 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 Wall thickness was increased  in a pattern of mild LVH.- Aortic valve: AV is thckened, calcified with no signifi stenosis.The atrium was severely dilated.: Systolic function was moderately reduced.The atrium was mildly dilated.PA peak pre   Nonischemic cardiomyopathy (HCC)    Palpitations    Pneumonia    PVC (premature ventricular contraction) 07/02/2011   PVC's (premature ventricular contractions)    Wheezing 04/29/2011   Sinus ct 05/2011:    Wheezing    Past Surgical History:  Past Surgical History:  Procedure Laterality Date   BI-VENTRICULAR IMPLANTABLE CARDIOVERTER DEFIBRILLATOR N/A 02/13/2012   Procedure: BI-VENTRICULAR IMPLANTABLE CARDIOVERTER DEFIBRILLATOR  (CRT-D);  Surgeon: Hillis Range, MD;  Location: [facility] CATH LAB;  Service: Cardiovascular;  Laterality: N/A;   BI-VENTRICULAR IMPLANTABLE CARDIOVERTER DEFIBRILLATOR  (CRT-D)  02/13/2012   BI-VENTRICULAR IMPLANTABLE CARDIOVERTER DEFIBRILLATOR  (CRT-D)  05/03/2019   CHANGEOUT   BIV ICD GENERATOR CHANGEOUT N/A 05/03/2019   Procedure: BIV ICD GENERATOR CHANGEOUT;  Surgeon: Hillis Range, MD;  Location: [facility] INVASIVE CV LAB;  Service: Cardiovascular;  Laterality: N/A;   CARDIAC CATHETERIZATION  2013   CARDIAC DEFIBRILLATOR PLACEMENT  02/13/2012   SJM [employer] BIV ICD implanted by Dr Johney Frame   IRRIGATION AND DEBRIDEMENT ELBOW Right 05/08/2023   Procedure: IRRIGATION AND DEBRIDEMENT ELBOW;  Surgeon: Roby Lofts, MD;  Location: [facility] OR;  Service: Orthopedics;  Laterality: Right;   IRRIGATION AND DEBRIDEMENT ELBOW Right 04/24/2023   Procedure: IRRIGATION AND DEBRIDEMENT ELBOW;  Surgeon: Roby Lofts, MD;  Location: [facility] OR;  Service: Orthopedics;  Laterality: Right;   NASAL SINUS SURGERY  93, 97, 2010, 2006  NASAL SINUS SURGERY     1993, 1997, 2006, 2010   ORIF PELVIC FRACTURE Right 05/08/2023   Procedure: OPEN REDUCTION INTERNAL FIXATION (ORIF) PELVIC FRACTURE;  Surgeon: Roby Lofts, MD;  Location: [facility] OR;  Service: Orthopedics;  Laterality: Right;   ORIF  PELVIC FRACTURE WITH PERCUTANEOUS SCREWS Bilateral 04/24/2023   Procedure: ORIF PELVIC FRACTURE WITH PERCUTANEOUS SCREWS;  Surgeon: Roby Lofts, MD;  Location: [facility] OR;  Service: Orthopedics;  Laterality: Bilateral;    Assessment / Plan / Recommendation Clinical Impression HPI: 82 year old male with medical hx significant for: HF with ICD . Pt presented to [facility] on 04/24/23 after a fall off a ladder as he was cutting limbs. Pt c/o pain in right elbow and right hip also had large laceration to right elbow area. Pelvic x-ray showed open book pelvic fracture. Pelvic binder placed. Imaging also revealed an open right elbow/olecranon fx, right 6-12 th rib fxs, right T10 TP fxs, age-indeterminate mild compression fxs of T 4, T7, T12. Incidental finding of pulmonary nodules and severe atherosclerotic calcifications of left carotid bulb and proximal left cervical ICA. Pt underwent percutaneous fixation of pelvis and I&D of right olecranon fx. SLP signed off on cognition and language due to b/c deficits.   Bedside Swallow Evaluation: A bedside swallow evaluation was completed to assess for s/sx of oropharyngeal dysphagia. Patient with complaints of "food getting stuck" past two days, therefore SLP re-consulted for BSE. Upon entrance, patient reclined in bed. SLP attempted to re-position patient to 90 degrees, however bed only reaching ~70 degrees. SLP placed pillows behind patients back to aid in adequate positioning for PO trials. Oral mechanism exam WFL. Patient observed with trials of thin liquids, purees, D2/D3 solids. Patient with immediate and delayed coughing throughout al trials indicative of possible bolus misdirection. Patient reported this is occurring frequently during mealtimes, though SLP noted throughout evaluation. Patient reports willingness to trial D2 diet as he feels "pieces of cut up foods are too big." SLP to downgrade solids to D2. Recommend continuation of thin liquids until MBS  can be acquired due to [facility] WBC and no signs of PNA on recent chest x-ray. Please ensure patient is upright at 90 degrees during intake and cue for small bites/sips.    Skilled Therapeutic Interventions          Patient evaluated via bedside swallow evaluation to assess swallowing function. See above for details.    SLP Assessment  Patient will need skilled Speech Lanaguage Pathology Services during CIR admission    Recommendations  SLP Diet Recommendations: Dysphagia 2 (Fine chop);Thin Liquid Administration via: Cup;Straw Medication Administration: Whole meds with liquid Compensations: Follow solids with liquid;Minimize environmental distractions;Slow rate;Small sips/bites Postural Changes and/or Swallow Maneuvers: Seated upright 90 degrees Oral Care Recommendations: Oral care BID Patient destination: Home Follow up Recommendations: 24 hour supervision/assistance Equipment Recommended: None recommended by SLP    SLP Frequency 1 to 3 out of 7 days   SLP Duration  SLP Intensity  SLP Treatment/Interventions 11/11  Minumum of 1-2 x/day, 30 to 90 minutes  Cueing hierarchy;Internal/external aids;Functional tasks;Patient/family education;Therapeutic Exercise    Pain Denies  Care Tool Care Tool Cognition Ability to hear (with hearing aid or hearing appliances if normally used Ability to hear (with hearing aid or hearing appliances if normally used): 0. Adequate - no difficulty in normal conservation, social interaction, listening to TV   Expression of Ideas and Wants Expression of Ideas and Wants: 4. Without difficulty (complex and basic) -  expresses complex messages without difficulty and with speech that is clear and easy to understand   Understanding Verbal and Non-Verbal Content Understanding Verbal and Non-Verbal Content: 3. Usually understands - understands most conversations, but misses some part/intent of message. Requires cues at times to understand  Memory/Recall Ability  Memory/Recall Ability : Current season;That he or she is in a hospital/hospital unit   Bedside Swallowing Assessment General Diet Prior to this Study: Dysphagia 3 (mechanical soft);Thin liquids (Level 0) Temperature Spikes Noted: No Respiratory Status: Room air History of Recent Intubation: No Behavior/Cognition: Alert;Cooperative Oral Cavity - Dentition: Adequate natural dentition Self-Feeding Abilities: Able to feed self Vision: Functional for self-feeding Patient Positioning: Partially reclined Baseline Vocal Quality: Normal Volitional Cough: Congested Volitional Swallow: Able to elicit  Ice Chips Ice chips: Not tested Thin Liquid Thin Liquid: Impaired Presentation: Self Fed;Straw;Cup Pharyngeal  Phase Impairments: Cough - Immediate;Cough - Delayed Nectar Thick Nectar Thick Liquid: Not tested Honey Thick Honey Thick Liquid: Not tested Puree Puree: Impaired Presentation: Self Fed;Spoon Pharyngeal Phase Impairments: Cough - Immediate;Cough - Delayed Solid Solid: Impaired Presentation: Self Fed Pharyngeal Phase Impairments: Cough - Immediate;Cough - Delayed BSE Assessment Risk for Aspiration Impact on safety and function: Moderate aspiration risk Other Related Risk Factors: Deconditioning  Short Term Goals: Week 1: SLP Short Term Goal 1 (Week 1): Patient will participate in an MBS to assess oropharyngeal swallow functioning  Refer to Care Plan for Long Term Goals  Recommendations for other services: None   Discharge Criteria: Patient will be discharged from SLP if patient refuses treatment 3 consecutive times without medical reason, if treatment goals not met, if there is a change in medical status, if patient makes no progress towards goals or if patient is discharged from hospital.  The above assessment, treatment plan, treatment alternatives and goals were discussed and mutually agreed upon: by patient  Louis Juarez M.A., CF-SLP 05/26/2023, 8:32 AM

## 2023-05-27 ENCOUNTER — Inpatient Hospital Stay (HOSPITAL_COMMUNITY): Payer: BC Managed Care – PPO

## 2023-05-27 MED ORDER — MELATONIN 5 MG PO TABS
5.0000 mg | ORAL_TABLET | Freq: Every day | ORAL | Status: DC
  Administered 2023-05-27 – 2023-05-29 (×3): 5 mg via ORAL
  Filled 2023-05-27 (×3): qty 1

## 2023-05-27 MED ORDER — SIMETHICONE 80 MG PO CHEW
80.0000 mg | CHEWABLE_TABLET | Freq: Four times a day (QID) | ORAL | Status: DC | PRN
  Administered 2023-05-27 (×2): 80 mg via ORAL
  Filled 2023-05-27 (×2): qty 1

## 2023-05-27 MED ORDER — QUETIAPINE FUMARATE 50 MG PO TABS
100.0000 mg | ORAL_TABLET | Freq: Every day | ORAL | Status: DC
  Administered 2023-05-27 – 2023-05-31 (×5): 100 mg via ORAL
  Filled 2023-05-27 (×5): qty 2

## 2023-05-27 NOTE — Progress Notes (Signed)
PROGRESS NOTE   Subjective/Complaints:  Patient's primary concern this morning was regarding his sleep.  He is really did not sleep well at night.  On review of sleep chart it appears he slept 8 hours.  Discussed this again with patient and he reports that he did sleep in a few hours but it took him a long time to fall asleep at night.  Pain overall controlled today.  He had a MBS today.   ROS: Patient denies fever, rash, sore throat, blurred vision, dizziness, nausea, vomiting, diarrhea, constipation, cough, shortness of breath  headache, or mood change.  + Right pelvis area pain/ R lower leg pain-improved + Insomnia-continued + Chest congestion and cough-improved  + Dysphagia-reports a little improved today  Objective:   DG Pelvis Comp Min 3V  Result Date: 05/25/2023 CLINICAL DATA:  Postop pain. EXAM: JUDET PELVIS - 3+ VIEW COMPARISON:  Pelvic radiograph dated 05/19/2023. FINDINGS: Extensive postoperative changes of the pelvis with fixation plate and screws as seen previously. The hardware is intact. No acute fracture or dislocation. The bones are osteopenic. 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 Degenerative changes of the visualized lower lumbar spine. The soft tissues are unremarkable. IMPRESSION: 1. No acute fracture or dislocation. 2. Stable postoperative changes of the pelvis with stable diastasis of the symphysis pubis. Electronically Signed   By: Elgie Collard M.D.   On: 05/25/2023 18:53   Recent Labs    05/25/23 0510  WBC 9.2  HGB 11.4*  HCT 36.3*  PLT 338    Recent Labs    05/25/23 0510  NA 137  K 3.9  CL 101  CO2 24  GLUCOSE 114*  BUN 18  CREATININE 0.79  CALCIUM 9.2     Intake/Output Summary (Last 24 hours) at 05/27/2023 1046 Last data filed at 05/27/2023 0748 Gross per 24 hour  Intake 220 ml  Output 700 ml  Net -480 ml        Physical Exam: Vital  Signs Blood pressure (!) 150/68, pulse 64, temperature (!) 97.5 F (36.4 C), temperature source Oral, resp. rate 17, height 5\' 10"  (1.778 m), weight 107.5 kg, SpO2 94%.   General: No apparent distress HEENT: MMM, EOMI Neck: Supple without JVD or lymphadenopathy Heart: Reg rate and rhythm.  Chest: CTAB, no increased WOB Abdomen: Soft, mildly-tender, non-distended, bowel sounds positive. Extremities: No clubbing, cyanosis, or edema. Pulses are 2+ Psych: Pt's affect is appropriate. Pt is cooperative.   Musculoskeletal:  Comments: Right elbow wound with dry dressing in place Prepubic and right lower quadrant incisions C/D/I  Neurological:     Mental Status: He is oriented to person, place, and time. Follows commands. + memory deficits       Assessment/Plan: 1. Functional deficits which require 3+ hours per day of interdisciplinary therapy in a comprehensive inpatient rehab setting. Physiatrist is providing close team supervision and 24 hour management of active medical problems listed below. Physiatrist and rehab team continue to assess barriers to discharge/monitor patient progress toward functional and medical goals  Care Tool:  Bathing    Body parts bathed by patient: Right arm, Left arm, Chest, Abdomen, Face   Body parts  bathed by helper: Front perineal area, Buttocks, Right upper leg, Left upper leg, Right lower leg, Left lower leg     Bathing assist Assist Level: Total Assistance - Patient < 25%     Upper Body Dressing/Undressing Upper body dressing   What is the patient wearing?: Hospital gown only    Upper body assist Assist Level: Moderate Assistance - Patient 50 - 74%    Lower Body Dressing/Undressing Lower body dressing      What is the patient wearing?: Hospital gown only, Incontinence brief     Lower body assist Assist for lower body dressing: Dependent - Patient 0%     Toileting Toileting    Toileting assist Assist for toileting: Dependent -  Patient 0%     Transfers Chair/bed transfer  Transfers assist     Chair/bed transfer assist level: Minimal Assistance - Patient > 75% (slide board B LE NWB +2 min A)     Locomotion Ambulation   Ambulation assist   Ambulation activity did not occur: Safety/medical concerns          Walk 10 feet activity   Assist  Walk 10 feet activity did not occur: Safety/medical concerns        Walk 50 feet activity   Assist Walk 50 feet with 2 turns activity did not occur: Safety/medical concerns         Walk 150 feet activity   Assist Walk 150 feet activity did not occur: Safety/medical concerns         Walk 10 feet on uneven surface  activity   Assist Walk 10 feet on uneven surfaces activity did not occur: Safety/medical concerns         Wheelchair     Assist Is the patient using a wheelchair?: Yes Type of Wheelchair: Manual    Wheelchair assist level: Supervision/Verbal cueing Max wheelchair distance: 150    Wheelchair 50 feet with 2 turns activity    Assist        Assist Level: Supervision/Verbal cueing   Wheelchair 150 feet activity     Assist      Assist Level: Supervision/Verbal cueing   Blood pressure (!) 150/68, pulse 64, temperature (!) 97.5 F (36.4 C), temperature source Oral, resp. rate 17, height 5\' 10"  (1.778 m), weight 107.5 kg, SpO2 94%.  Medical Problem List and Plan: 1. Functional deficits secondary to polytrauma             -patient may shower             -ELOS/Goals: 14-20 days S with PT/OT             -Continue CIR -SLP consult for cognition-felt to be at baseline, slums score of 21 out of 30 -Team conference today please see physician documentation under team conference tab, met with team  to discuss problems,progress, and goals. Formulized individual treatment plan based on medical history, underlying problem and comorbidities.  --Dr. Jena Gauss rec NWB, slide board transfers, only 1 session of therapy a  day.  Recommended consideration of SNF, patient is not interested SNF.  He has several family members coming to help -QD therapy  -Team conference today please see physician documentation under team conference tab, met with team  to discuss problems,progress, and goals. Formulized individual treatment plan based on medical history, underlying problem and comorbidities.    2.  Antithrombotics: -DVT/anticoagulation:  Pharmaceutical: Lovenox 30mg  BID, Eliquis 2.5 mg twice daily for 30 days on discharge for DVT prophylaxis             -  antiplatelet therapy: N/A 3. Pain Management: continue tylenol 650mg  q6h, robaxin 1000mg  q8h, Oxycodone as needed severe pain -10/25 Will schedule oxycodone 5 mg at 7 AM, gabapentin 100 mg 3 times daily started -05/23/23 mildly sore throat, cepacol ordered -Gabapentin increased to 300 mg 3 times daily 11/4, Xray Pelvix ordered, Robaxin dose was decreased to 750 mg -05/26/23 continue current pain regimen  05/27/23 pain control today, continue to monitor and continue current pain management 4. Mood/Behavior/Sleep: LCSW to follow for evaluation and support             -antipsychotic agents: Seroquel 50mg  as needed  -Lorazepam 0.5mg  QHS -05/16/23 sleeping fair but wants to try increased melatonin dose, inc to 5mg  at bedtime, monitor sleep for now-- improved 05/17/23! -05/18/2023 will increase Seroquel to 75 mg at bedtime -10/31 Increase seroquel to 100mg  HS  -Sleep-wake chart ordered -05/23/23 didn't sleep well but didn't get seroquel-- wrote it down for him to ask, discussed with nursing to also ask if he wants it tonight-- helped 05/24/23! -05/25/23 new order placed for sleep-wake chart -05/26/23 discussed sleep-wake monitoring with nursing, will increase lorazepam to 1 mg. Discussed options with pharmacy appreciate assistance -11/6 appears he is sleeping most of the night, will adjust schedule of his insomnia medications to start a little earlier.  Possibly memory deficits  contributing to his feeling that he is having a hard time sleeping. 5. Neuropsych/cognition: This patient is  capable of making decisions on his own behalf. 6. Skin/Wound Care: Routine pressure-relief measures.   --Monitor right elbow for drainage/healing. 7. Fluids/Electrolytes/Nutrition: Monitor I/os.  Continue Ensure supplements.             -- Add vitamin C/D3, MVI, zinc and Juven. 8. Pelvic ring fracture s/p percutaneous fixation: Strict NWB RLE and WBAT LLE for transfers only.  -- hardware loosening noted, NWB b/l LE, slide board transfers  -Air mattress for wound prevention  -11/5 x-ray Pelvis yesterday stable 9. Right elbow wound drainage: Now on doxycycline.  Reports night sweats.  Cultures sent -Discussed with Dr. Jena Gauss yesterday who recommended Doxycycline 100 mg twice daily, his team plans to follow-up and check on him and CIR.   -10/25 wound VAC started by Ortho appreciate assistance, continue current antibiotics, will recheck labs tomorrow -05/16/23 BCx and elbow Cx NGTD x3 days, WBC down to 10.4 today; monitor-- still NGTD 05/17/23 -10/28 WBC 12.0 today, continue doxycycline and wound VAC -10/30 wound vac removed by ortho -10/31 wound with small amount of draining, continue to monitor and continue abx, WBC down to 10.9 -11/1 orthopedics contacted regarding wound, continue antibiotics and monitor for now  11/6 consider discontinuation of antibiotics 10. Acute blood loss anemia: Monitor for any signs of bleeding.  Hgb stable 10.1 on 05/21/23 11. Leukocytosis: White count trending down from 23.6-->12.9>> 10.1 10/26; monitor routinely 12.  Thrombocytosis: Likely reactive.  Continue to monitor.  446 on 10/28 13. Low protein stores: Albumin down to 2.2--supplements added. 14. H/o NICM/HF s/p ICD:  Daily weights/monitor for signs of overload. Change diet to heart healthy.  No signs of fluid overload -- On Coreg 25mg  BID, aldactone 25mg  QD, Lasix 20mg  BID, and Pravachol 80mg  daily,  Zetia 10mg  daily             --decrease Kdur to 20 meq bid as K trending up.  --Repeat chest x-ray in a.m.- no acute findings -10/29 recheck chest x-ray today- no acute changes -11/1 patient denies any shortness of breath, reports cough and congestion is better today -  11/5 BP intermittently elevated, continue to monitor trend for now [facility] Weights   05/25/23 0600 05/27/23 0421 05/27/23 1040  Weight: 110.7 kg 107 kg 107.5 kg      05/27/2023   10:40 AM 05/27/2023    6:03 AM 05/27/2023    4:21 AM  Vitals with BMI  Weight 237 lbs  236 lbs  BMI 34.01  33.86  Systolic  150   Diastolic  68   Pulse  64      15. Multiple rib fractures: Encourage pulmonary hygiene. Incentive spirometer ordered  -10/29 patient bringing up more mucus today, may be due to the Mucinex   16. Urinary retention: Monitor voiding with PVR checks.  continue Flomax 0.8mg  daily.  -10/24 Required IC this AM, continue to monitor  PVRs  -Timed voids every 4 hours, consider UA if urinary symptoms worsen -05/16/23 PVRs decreasing, hasn't needed cath since yesterday; monitor -05/17/23 needed cath last night for and in the afternoon yesterday; defer to weekday team regarding management of this -10/31 intermittently incontinent of bladder, suspect limited mobility is contributing.  Elevated bladder scan noted by nursing when I came in the room.  He denied sensation of needing to void.  We discussed offering him a urinal to try to void.  Appears he had a PVR later in the morning with 9 mL.  Timed voids ordered. -11/5 has not required IC in couple days, continue to monitor -11/6 discontinued PVRs   17. Constipation. Appears to be having regular Bms.   -Cont miralax prn -05/17/23 last BM this morning but loose, no stool softeners being given, likely from doxycycline, monitor for ongoing diarrhea -11/1 LBM yesterday but small, will start MiraLAX daily, sorbitol as needed ordered -05/23/23 LBM yesterday but pt reports  no good one in a couple days, will increase miralax BID for now.  -05/24/23 LBM this morning, cont regimen -11/6 LBM today  18.  Dysphagia.  SLP consult placed.  Diet decreased to D3 for now  -Unclear cause of dysphagia, may be more chronic issue.  Continue G-tube thin diet.  Had MBS today.  Continue SLP.  LOS: 14 days A FACE TO FACE EVALUATION WAS PERFORMED  Fanny Dance 05/27/2023, 10:46 AM

## 2023-05-27 NOTE — Progress Notes (Signed)
Patient ID: Louis Meadow., male   DOB: Dec 17, 1940, 82 y.o.   MRN: [id number]  Met with pt and spoke with wife via telephone to discuss team conference progress this week and family training tomorrow. Has received some of the equipment-wheelchair and transfer board. Need to switch out drop-arm bedside commode with bariatric drop-arm bedside commode. Have sent the justification by OT to [employer] for this. Wife reports their insurance will cover a ramp will try to call the number to see if can assist with this. Aware plan for discharge Monday 11/11 due to speech needs to monitor swallowing now since has had some issues last couple days. See tomorrow when here for education at 9;00-11;00

## 2023-05-27 NOTE — Progress Notes (Signed)
Physical Therapy Session Note  Patient Details  Name: Louis Juarez. MRN: [id number] Date of Birth: 12-26-1940  Today's Date: 05/27/2023 PT Individual Time: 1131-1201 PT Individual Time Calculation (min): 30 min   Short Term Goals: Week 2:  PT Short Term Goal 1 (Week 2): pt will perform supine to sit and sit to supine with min A or less consistently PT Short Term Goal 2 (Week 2): pt will perform slide board transfer with mod A or less consistently PT Short Term Goal 3 (Week 2): Pt will perform dynamic sitting balance with min A or less  Skilled Therapeutic Interventions/Progress Updates:     Pt seated in [facility] upon arrival with wife in room. Pt agreeable to therapy. Pt complains of abdominal pain. Nurse notified. Pt request to get on BSC.   Pt performed slide board transfer WC to [facility] with min A for maintenance of B LE NWBing precautions, and verbal cues to pt of positioning buttocks over hole, as pt trying to move board versus body. Pt performed lateral leaning for placement of board and donning pants and brief/performing pericare, verbal and tactile cues provided for technique. Pt wife assisted with maintenance of B LE NWBing while therapist assisted with donning/doffing pants. Pt continent of bowel.   Pt performed slide board transfer BSC to WC and WC to hospital bed with therapist assisting with placement of slideboard, and managing B LE NWBing precautions.   Pt able to recall B LE NWBing precautions today, however education provided that 2/2 cognitive deficits pt frequently needs reminders. Pt wife verbalized understanding.   Sit to supine with supervision, verbal cues provided for laying down on R elbow with head on pillow, then lifting legs onto bed as pt flails head back (to opposite bed rail) without cue.   Pt supine in bed with tech in room at end of session.  Therapy Documentation Precautions:  Precautions Precautions: Fall Restrictions Weight Bearing Restrictions:  Yes RUE Weight Bearing: Weight bearing as tolerated RLE Weight Bearing: Non weight bearing LLE Weight Bearing: Weight bearing as tolerated Other Position/Activity Restrictions: NWB BLE  Therapy/Group: Individual Therapy  [facility] [name], [place], DPT  05/27/2023, 12:15 PM

## 2023-05-27 NOTE — Procedures (Signed)
Modified Barium Swallow Study  Patient Details  Name: Louis Juarez. MRN: [id number] Date of Birth: 27-Feb-1941  Today's Date: 05/27/2023  Modified Barium Swallow completed.  Full report located under Chart Review in the Imaging Section.  History of Present Illness 82 y.o. male presents to [facility] hospital on 04/24/2023 after fall from ladder. Pt found to have open book pelvic fx, open R olceranon fx, R rib 6-12 fx, R T10 TP fx, and age-indeterminate mild compression fxs of T4, T7 and T12. Pt underwent percutaneous fixation of pelvic fxs and open treatment of R olecranon fx on 10/4. PMH includes heart failure, ICD.   Clinical Impression Patient presents with moderate oropharyngeal dysphagia. Oral phase remarkable for reduced lingual control/coordination resulting in posterior spillage of thin liquids x1 to trachea and otherwise to vallecula. Mastication timely and multiple swallows aided in clearance of trace diffuse oral residuals. Pharyngeal phase is characterized by incomplete epiglottic inversion, reduced laryngeal elevation, incomplete laryngeal vestibule closure and reduced pharyngeal stripping wave resulting in moderate diffuse pharyngeal residuals and penetration/aspiration of various consistencies. Patient with audible aspiration of initial thin liquid trial as swallow initiation delayed to the level of the trachea. Audible aspiration x1 of puree, otherwise consistent penetration of thin liquids, NTL and HTL. Aspiration unable to be completely cleared despite spontaneous coughing. No penetration/aspiration present during trials of D3, though patient requests continuation of D2 diet. Moderate diffuse pharyngeal residuals reduced in amounts after cued multiple effortful swallows. During trial of pill via thin liquids, entire pill lodged in vallecula, however cleared with subsequent trials of puree. Recommend D2/thin liquid diet with intermittent throat clear and multiple swallows. Medications  should be administered crushed in puree with strict supervision from nursing. Recommend continuation of standardized swallowing precautions including sitting upright at 90 degrees, small bites and sips and NO STRAWS with thin liquids.   Factors that may increase risk of adverse event in presence of aspiration [employer]ye [employer]aks [employer] Clearance Coots 2021): Frail or deconditioned;Aspiration of thick, dense, and/or acidic materials  Swallow Evaluation Recommendations Recommendations: PO diet PO Diet Recommendation: Dysphagia 2 (Finely chopped);Thin liquids (Level 0) Liquid Administration via: Cup;No straw Medication Administration: Crushed with puree Supervision: Intermittent supervision/cueing for swallowing strategies Swallowing strategies  : Slow rate;Small bites/sips;Minimize environmental distractions;Clear throat intermittently Postural changes: Position pt fully upright for meals Oral care recommendations: Oral care BID (2x/day) Caregiver Recommendations: Have oral suction available   Hildreth Robart M.A., CF-SLP 05/27/2023,11:43 AM

## 2023-05-27 NOTE — Progress Notes (Signed)
Repeat imaging of pelvis from 05/25/23 has been reviewed. Imaging appears stable. Continue current care per CIR. No changes to the plan from ortho standpoint. I recommend continuing with non-weightbearing bilateral lower extremities. Ok for [employer] transfers. Would continue with only 1 therapy session per day. Will continue to follow and plan for repeat x-rays in about 2 weeks.  Thompson Caul PA-C [facility] [phone] (office) [email or web address]

## 2023-05-27 NOTE — Discharge Instructions (Addendum)
Inpatient Rehab Discharge Instructions  Louis Juarez. Discharge date and time:  06/01/23  Activities/Precautions/ Functional Status: Activity: no lifting, driving, or strenuous exercise till cleared by MD Diet: cardiac diet--chopped foods. Needs to be upright for meals. No straws Wound Care:  Elbow wound: Cleanse with normal saline, pat dry and apply dry dressing. Keep wound clean and dry   COMMUNITY REFERRALS UPON DISCHARGE:    Home Health:   PT  OT   RN                 Agency:[facility] [phone]    Medical Equipment/Items Ordered:WHEELCHAIR, HOSPITAL BED, TRANSFER BOARD, WIDE DROP-ARM BEDSIDE COMMODE                                                 Agency/Supplier:[facility]   [phone] WIFE PURCHASED TUB BENCH BEDSIDE TABLE  WIFE TO GET RAMP INSTALLED FOR FOLLOW UP APPOINTMENTS  Functional status:  ___ No restrictions     ___ Walk up steps independently _X__ 24/7 supervision/assistance   ___ Walk up steps with assistance ___ Intermittent supervision/assistance  ___ Bathe/dress independently ___ No walking      ___ Bathe/dress with assistance ___ Walk Independently    ___ Shower independently ___ Walk with assistance    ___ Shower with assistance _X__ No alcohol     ___ Return to work/school ________   Special Instructions: Use sliding board for transfers. No weight on your legs till cleared by ortho.  NO WALKING. Keep right elbow clean and dry. No ointments, creams and lotions. Keep dry dressing on elbow till drainage resolves. Keep padding on elbow to prevent breakdown. Monitor area daily. Contact Dr. Jena Gauss if you develop any problems with your incision/wound--redness, swelling, increase in pain, drainage or if you develop fever or chills.  4. Follow up with GI for input on swallow function.   My questions have been answered and I understand these instructions. I will adhere to these goals and the provided educational materials after my  discharge from the hospital.  Patient/Caregiver Signature _______________________________ Date __________  Clinician Signature _______________________________________ Date __________  Please bring this form and your medication list with you to all your follow-up doctor's appointments.

## 2023-05-27 NOTE — Progress Notes (Signed)
Patient refused to wear right lower extremity PRAFO. Patient educated on risk/benefits. Patient states " it does not work". Will continue to provide education.

## 2023-05-27 NOTE — Patient Care Conference (Signed)
Inpatient RehabilitationTeam Conference and Plan of Care Update Date: 05/27/2023   Time: 12:04 PM    Patient Name: Louis Juarez.      Medical Record Number: [id number]  Date of Birth: 1940/12/14 Sex: Male         Room/Bed: 4W04C/4W04C-01 Payor Info: Payor: [employer] / Plan: BCBS COMM PPO / Product Type: *No Product type* /    Admit Date/Time:  05/13/2023  2:31 PM  Primary Diagnosis:  Trauma  Hospital Problems: Principal Problem:   Trauma Active Problems:   NICM (nonischemic cardiomyopathy) (HCC)   Urinary retention   Depressive reaction   Generalized anxiety disorder    Expected Discharge Date: Expected Discharge Date: 06/01/23  Team Members Present: Physician leading conference: Dr. Fanny Dance Social Worker Present: Dossie Der, LCSW Nurse Present: Chana Bode, RN PT Present: [name], PT OT Present: [name], OT SLP Present: Feliberto Gottron, SLP PPS Coordinator present : Fae Pippin, SLP     Current Status/Progress Goal Weekly Team Focus  Bowel/Bladder   Pt is continent, but have some incontinent episodes at times. PVRs/Bladder scans are ordered d/t pt retaining at times as well. LBM: 11/5   To encourage timed toileting so that pt could create a habit of voiding on his own.   Assist w/ toileting needs q 2-4 hours and as needed. Measure PVRs and bladder scan volumes to monitor for urinary retention.    Swallow/Nutrition/ Hydration   Dys. 2 textures with thin liquids, Intermittent aspiration on MBS today. Supervision-Min A for use of swallowing strategies   Supervision  tolerance of current diet, use of strategies    ADL's   Setup/Supervision UB ADLs, Min-Mod A for LB ADLs, incontinent of B/B, Max A for pericare   Min A with exception of seated UB care   Family education & discharge planning    Mobility   pt at goal level for wheelchair mobility, bed mobility with hospital bed, and slide board transfer. Pt requires  min A for maintenance of B LE NWBing precautions, and max cues-instructional provided for technique 2/2 cognitive deficits.   supervision/min A  Family training scheudled for 11/7, D/C 11/11. Pt is getting ambulance transfer home. Discussed pt installing ramp for follow up therapies.    Communication                Safety/Cognition/ Behavioral Observations               Pain   Pt denies pain at the moment, but usually c/o pain in the morning when he wakes up. Pain medication helps to manage pain.   To keep the pain at a minimal, preferably < 4/10 on the pain scale.   Assess pain q shift & PRN.    Skin   Wound to R elbow - q shift dressing change. Incision to ABD - skin glue/OTA. scattered bruising/abrasions.   Pt's wound to R elbow will heal in a timely manner w/o complications.  Assess skin q shift & PRN.      Discharge Planning:  Home with wife and son coming from [place] if not here at [place] has others to step in. Family training set up for tomorrow and have ordered DME. See if can obtain follow up a few visits due to will want to save for when can WB on his legs.   Team Discussion: Patient post polytrauma; with non-weight bearing bilateral lower extremities and Dysphagia. Limited by poor memory and perseveration; requiring max functional  cues for adherence to techniques and follow precautions.  Patient on target to meet rehab goals: yes, currently needs min assist for slide-board transfers. Completes upper body care with set up and needs min assist for lower body care.   *See Care Plan and progress notes for long and short-term goals.   Revisions to Treatment Plan:  MBS; dysphagia is not neuro based; more timing issue. Aspiration of thin liquids with whole pills and puree; maintained on a dysphagia 2 thin liquid diet with meds crushed in puree.    Teaching Needs: Compensatory strategies, safety, medications, skin care, precautions for weight bearing, transfers, toileting, etc.   Current Barriers to Discharge: Decreased caregiver support, Home enviroment access/layout, and Weight bearing restrictions  Possible Resolutions to Barriers: Family education DME: TTB, hip kit, DA- BSC, hospital bed HH follow up services     Medical Summary Current Status: Polytrauma, Dysphagia, insomnia, R elbow wound, Anemia, Heart failure, constipation  Barriers to Discharge: Medical stability;Uncontrolled Pain;Complicated Wound;Weight bearing restrictions  Barriers to Discharge Comments: Polytrauma, Dysphagia, insomnia, R elbow wound, Anemia, Heart failure, constipation Possible Resolutions to [employer] Focus: BMS today, modifired diet, upright when eating, adjust timing of medications   Continued Need for Acute Rehabilitation Level of Care: The patient requires daily medical management by a physician with specialized training in physical medicine and rehabilitation for the following reasons: Direction of a multidisciplinary physical rehabilitation program to maximize functional independence : Yes Medical management of patient stability for increased activity during participation in an intensive rehabilitation regime.: Yes Analysis of laboratory values and/or radiology reports with any subsequent need for medication adjustment and/or medical intervention. : Yes   I attest that I was present, lead the team conference, and concur with the assessment and plan of the team.   Chana Bode B 05/27/2023, 1:27 PM

## 2023-05-27 NOTE — Progress Notes (Signed)
Occupational Therapy Weekly Progress Note  Patient Details  Name: Louis Juarez. MRN: [id number] Date of Birth: Dec 14, 1940  Beginning of progress report period: May 20, 2023 End of progress report period: June 01, 2023  Patient presents this reporting period with adjustments to [facility] precautions, now NWB to BLE due to pelvic hardware shifting. In lieu of this precaution change, pt POC for LB ADLs have been shifted to bed-level to maintain pelvic structure integrity, currently functioning at Mod level A. Pt setup/supervision for UB ADLs at sink-side. Having made strong progress with SB transfers, requiring Min A for BLE elevation and slide board placement.   Patient continues to demonstrate the following deficits: muscle weakness, decreased cardiorespiratoy endurance, decreased awareness, decreased problem solving, decreased safety awareness, decreased memory, and delayed processing, and decreased sitting balance and decreased balance strategies and therefore will continue to benefit from skilled OT intervention to enhance overall performance with BADL and Reduce care partner burden.  Patient progressing toward long term goals..  Continue plan of care.  OT Short Term Goals Week 2:  OT Short Term Goal 1 (Week 2): Pt will perform squat-pivot transfer to BSC/toilet with consistent Min A. OT Short Term Goal 1 - Progress (Week 2): Discontinued (comment) (Goal discontinued due to NWB BLE precautions (~10/30).) OT Short Term Goal 2 (Week 2): Pt will manage LB dressing with consistent Mod A + LRAD. OT Short Term Goal 2 - Progress (Week 2): Met OT Short Term Goal 3 (Week 2): Pt will complete LB bathing with consistent Min A + LRAD. OT Short Term Goal 3 - Progress (Week 2): Partly met Week 3:  OT Short Term Goal 1 (Week 3): STGs=LTGs due to patient's length of stay.  Therapy Documentation Precautions:  Precautions Precautions: Fall Restrictions Weight Bearing Restrictions: Yes RUE Weight  Bearing: Weight bearing as tolerated RLE Weight Bearing: Non weight bearing LLE Weight Bearing: Weight bearing as tolerated Other Position/Activity Restrictions: NWB BLE   Isabella Stalling 05/27/2023, 1:03 PM

## 2023-05-27 NOTE — Progress Notes (Signed)
Occupational Therapy Note  Patient Details  Name: Louis Juarez. MRN: [id number] Date of Birth: 16-May-1941  Patient presents to CIR program with significant orthopedic trauma, resulting in his current medical precautions of bilateral lower extremity non-weight bearing. As a result, this patient's only method of safe transfer is a slide board, making a bariatric drop-arm BSC the recommended DME as he requires the increased room for slide board placement, functional reach for pericare, as well as decreased caregiver burden.   [name], OTR/L, MSOT  05/27/2023, 5:16 AM

## 2023-05-27 NOTE — Progress Notes (Addendum)
Occupational Therapy Session Note  Patient Details  Name: Louis Juarez. MRN: [id number] Date of Birth: Jan 03, 1941  Today's Date: 05/27/2023 OT Individual Time: 6967-8938 OT Individual Time Calculation (min): 30 min    Short Term Goals: Week 2:  OT Short Term Goal 1 (Week 2): Pt will perform squat-pivot transfer to BSC/toilet with consistent Min A. OT Short Term Goal 2 (Week 2): Pt will manage LB dressing with consistent Mod A + LRAD. OT Short Term Goal 3 (Week 2): Pt will complete LB bathing with consistent Min A + LRAD.  Skilled Therapeutic Interventions/Progress Updates:  Pt greeted resting in bed for skilled OT session with focus on functional transfers and grooming at sink-side.   Pain: Pt with no reports of pain! OT offering intermediate rest breaks and positioning suggestions throughout session to address potential pain/fatigue and maximize participation/safety in session.   Functional Transfers: Pt able to recall BLE NWB, stating "I can't put them on the floor and put weight on them." Performing SB transfer from EOB>WC with assistance for SB placement and elevation for BLE for WB precaution adherence.   Self Care Tasks: At bed-level, pt requires A for threading BLE into shorts, for pelvic structure stability, rolling bilaterally with supervision to hike over bottom/hips with cuing for task. By sink-side, pt completes oral care, change of t-shirt, shaving, and hair brushing all with setup/retrieval of needed items.   Pt remained sitting in WC with 4Ps assessed and immediate needs met. Pt continues to be appropriate for skilled OT intervention to promote further functional independence in ADLs/IADLs.   Therapy Documentation Precautions:  Precautions Precautions: Fall Restrictions Weight Bearing Restrictions: Yes RUE Weight Bearing: Weight bearing as tolerated RLE Weight Bearing: Non weight bearing LLE Weight Bearing: Non weight bearing Other Position/Activity  Restrictions: NWB BLE   Therapy/Group: Individual Therapy  [name], OTR/L, MSOT  05/27/2023, 5:14 AM

## 2023-05-28 ENCOUNTER — Inpatient Hospital Stay (HOSPITAL_COMMUNITY): Payer: BC Managed Care – PPO

## 2023-05-28 LAB — CBC
HCT: 41.4 % (ref 39.0–52.0)
Hemoglobin: 12.9 g/dL — ABNORMAL LOW (ref 13.0–17.0)
MCH: 31.7 pg (ref 26.0–34.0)
MCHC: 31.2 g/dL (ref 30.0–36.0)
MCV: 101.7 fL — ABNORMAL HIGH (ref 80.0–100.0)
Platelets: 290 10*3/uL (ref 150–400)
RBC: 4.07 MIL/uL — ABNORMAL LOW (ref 4.22–5.81)
RDW: 16.5 % — ABNORMAL HIGH (ref 11.5–15.5)
WBC: 11.1 10*3/uL — ABNORMAL HIGH (ref 4.0–10.5)
nRBC: 0 % (ref 0.0–0.2)

## 2023-05-28 LAB — BASIC METABOLIC PANEL
Anion gap: 11 (ref 5–15)
BUN: 19 mg/dL (ref 8–23)
CO2: 27 mmol/L (ref 22–32)
Calcium: 9.3 mg/dL (ref 8.9–10.3)
Chloride: 98 mmol/L (ref 98–111)
Creatinine, Ser: 0.7 mg/dL (ref 0.61–1.24)
GFR, Estimated: >60 mL/min (ref >60)
Glucose, Bld: 128 mg/dL — ABNORMAL HIGH (ref 70–99)
Potassium: 4.2 mmol/L (ref 3.5–5.1)
Sodium: 136 mmol/L (ref 135–145)

## 2023-05-28 NOTE — Progress Notes (Signed)
Patient ID: Louis Juarez., male   DOB: Dec 20, 1940, 82 y.o.   MRN: [id number] Son and pt;s wife are here for education today. Pt reports feeling better and feels swallowing better. Made aware will switch out the drop-arm bedside commode with a wide drop-arm commode due to using the slide board. Discussed called insurance and they tell this worker they will not cover a ramp. Gave wife some ramp resources and she has also looked into it. They will need a ramp for the short term until pt can WB on his legs. Plan to go home non-emergency ambulance but will need to go back and forth to follow up appointments.

## 2023-05-28 NOTE — Progress Notes (Addendum)
PROGRESS NOTE   Subjective/Complaints:  Patient's son and wife are here for education today.  Patient was started on oxygen this morning after SpO2 was 88% on room air.  Chest x-ray was ordered-negative for acute disease.  Patient denies shortness of breath.   ROS: Patient denies fever, rash, sore throat, blurred vision, dizziness, nausea, vomiting, diarrhea, constipation, cough, shortness of breath  headache, or mood change.  + Right pelvis area pain/ R lower leg pain-improved + Insomnia-improved today + Chest congestion and cough-improved  + Dysphagia-reports a little improved today  Objective:   DG Chest 2 View  Result Date: 05/28/2023 CLINICAL DATA:  Shortness of breath.  Cough. EXAM: CHEST - 2 VIEW COMPARISON:  Chest radiographs 05/19/2023 FINDINGS: An ICD remains in place. The cardiomediastinal silhouette is within limits. There is chronic coarsening of the interstitial markings. No overt pulmonary edema, acute airspace consolidation, pleural effusion, or pneumothorax is identified. No acute osseous abnormality is seen. IMPRESSION: No active cardiopulmonary disease. Electronically Signed   By: Sebastian Ache M.D.   On: 05/28/2023 10:59   DG Swallowing Func-Speech Pathology  Result Date: 05/27/2023 Table formatting from the original result was not included. Modified Barium Swallow Study Patient Details Name: Louis Juarez. MRN: [id number] Date of Birth: 12-02-1940 Today's Date: 05/27/2023 HPI/PMH: HPI: 82 y.o. male presents to [facility] : [facility] hospital on 04/24/2023 after fall from ladder. Pt found to have open book pelvic fx, open R olceranon fx, R rib 6-12 fx, R T10 TP fx, and age-indeterminate mild compression fxs of T4, T7 and T12. Pt underwent percutaneous fixation of pelvic fxs and open treatment of R olecranon fx on 10/4. PMH includes heart failure, ICD. Clinical Impression: Patient presents with moderate oropharyngeal dysphagia. Oral  phase remarkable for reduced lingual control/coordination resulting in posterior spillage of thin liquids x1 to trachea and otherwise to vallecula. Mastication timely and multiple swallows aided in clearance of trace diffuse oral residuals. Pharyngeal phase is characterized by incomplete epiglottic inversion, reduced laryngeal elevation, incomplete laryngeal vestibule closure and reduced pharyngeal stripping wave resulting in moderate diffuse pharyngeal residuals and penetration/aspiration of various consistencies. Patient with audible aspiration of initial thin liquid trial as swallow initiation delayed to the level of the trachea. Audible aspiration x1 of puree, otherwise consistent penetration of thin liquids, NTL and HTL. Aspiration unable to be completely cleared despite spontaneous coughing. No penetration/aspiration present during trials of D3, though patient requests continuation of D2 diet. Moderate diffuse pharyngeal residuals reduced in amounts after cued multiple effortful swallows. During trial of pill via thin liquids, entire pill lodged in vallecula, however cleared with subsequent trials of puree. Recommend D2/thin liquid diet with intermittent throat clear and multiple swallows. Medications should be administered crushed in puree with strict supervision from nursing. Recommend continuation of standardized swallowing precautions including sitting upright at 90 degrees, small bites and sips and NO STRAWS with thin liquids. Factors that may increase risk of adverse event in presence of aspiration Rubye Oaks & Clearance Coots 2021): Factors that may increase risk of adverse event in presence of aspiration Rubye Oaks & Clearance Coots 2021): Frail or deconditioned; Aspiration of thick, dense, and/or acidic materials Recommendations/Plan: Swallowing Evaluation Recommendations Swallowing Evaluation Recommendations Recommendations: PO diet  PO Diet Recommendation: Dysphagia 2 (Finely chopped); Thin liquids (Level 0) Liquid  Administration via: Cup; No straw Medication Administration: Crushed with puree Supervision: Intermittent supervision/cueing for swallowing strategies Swallowing strategies  : Slow rate; Small bites/sips; Minimize environmental distractions; Clear throat intermittently Postural changes: Position pt fully upright for meals Oral care recommendations: Oral care BID (2x/day) Caregiver Recommendations: Have oral suction available Treatment Plan Treatment Plan Treatment recommendations: Therapy as outlined in treatment plan below Follow-up recommendations: Follow physicians's recommendations for discharge plan and follow up therapies Recommendations Comment: n/a Functional status assessment: Patient has had a recent decline in their functional status and demonstrates the ability to make significant improvements in function in a reasonable and predictable amount of time. Treatment frequency: Min 1x/week Treatment duration: 2 weeks Interventions: Aspiration precaution training; Diet toleration management by SLP; Compensatory techniques Recommendations Recommendations for follow up therapy are one component of a multi-disciplinary discharge planning process, led by the attending physician.  Recommendations may be updated based on patient status, additional functional criteria and insurance authorization. Assessment: Orofacial Exam: Orofacial Exam Oral Cavity - Dentition: Adequate natural dentition Anatomy: Anatomy: WFL Boluses Administered: Boluses Administered Boluses Administered: Thin liquids (Level 0); Mildly thick liquids (Level 2, nectar thick); Moderately thick liquids (Level 3, honey thick); Puree; Solid  Oral Impairment Domain: Oral Impairment Domain Lip Closure: No labial escape Tongue control during bolus hold: Posterior escape of less than half of bolus Bolus preparation/mastication: Timely and efficient chewing and mashing Bolus transport/lingual motion: Brisk tongue motion Oral residue: Trace residue lining  oral structures Location of oral residue : Tongue; Palate Initiation of pharyngeal swallow : Valleculae; No visible initiation at any location (no visible initiation during first sip of thin liquids)  Pharyngeal Impairment Domain: Pharyngeal Impairment Domain Soft palate elevation: No bolus between soft palate (SP)/pharyngeal wall (PW) Laryngeal elevation: Partial superior movement of thyroid cartilage/partial approximation of arytenoids to epiglottic petiole Anterior hyoid excursion: Partial anterior movement Epiglottic movement: Partial inversion Laryngeal vestibule closure: Incomplete, narrow column air/contrast in laryngeal vestibule Pharyngeal stripping wave : Present - diminished Pharyngeal contraction (A/P view only): N/A Pharyngoesophageal segment opening: Partial distention/partial duration, partial obstruction of flow Tongue base retraction: Trace column of contrast or air between tongue base and PPW Pharyngeal residue: Collection of residue within or on pharyngeal structures Location of pharyngeal residue: Diffuse (>3 areas)  Esophageal Impairment Domain: Esophageal Impairment Domain Esophageal clearance upright position: Complete clearance, esophageal coating Pill: Pill Consistency administered: Thin liquids (Level 0); Puree Thin liquids (Level 0): Impaired (see clinical impressions) Puree: WFL Penetration/Aspiration Scale Score: Penetration/Aspiration Scale Score 1.  Material does not enter airway: Solid; Pill 3.  Material enters airway, remains ABOVE vocal cords and not ejected out: Mildly thick liquids (Level 2, nectar thick); Moderately thick liquids (Level 3, honey thick) 7.  Material enters airway, passes BELOW cords and not ejected out despite cough attempt by patient: Thin liquids (Level 0); Puree Compensatory Strategies: Compensatory Strategies Compensatory strategies: Yes Multiple swallows: Effective Effective Multiple Swallows: Puree; Thin liquid (Level 0); Mildly thick liquid (Level 2, nectar  thick); Moderately thick liquid (Level 3, honey thick) Chin tuck: -- (patient with natural chin tuck) Other(comment): Effective (throat clear/cough) Effective Other(comment): Thin liquid (Level 0)   General Information: No data recorded Diet Prior to this Study: Dysphagia 2 (finely chopped); Thin liquids (Level 0)   No data recorded  No data recorded  No data recorded  No data recorded Behavior/Cognition: Alert; Cooperative; Pleasant mood No data recorded Baseline vocal quality/speech: Normal Volitional Cough: Able  to elicit Volitional Swallow: Able to elicit Exam Limitations: No limitations Goal Planning: Prognosis for improved oropharyngeal function: Fair Barriers to Reach Goals: Cognitive deficits Barriers/Prognosis Comment: cognition Patient/Family Stated Goal: PO diet Consulted and agree with results and recommendations: Patient Pain: No data recorded End of Session: Start Time:No data recorded Stop Time: No data recorded Time Calculation:No data recorded Charges: No data recorded SLP visit diagnosis: SLP Visit Diagnosis: Dysphagia, unspecified (R13.10) Past Medical History: Past Medical History: Diagnosis Date  AICD (automatic cardioverter/defibrillator) present   Anxiety   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 Bilateral pleural effusions,  Question mild subpleural reticulation, indicative of fibrosis.  Coronary artery calcification. 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 Wall thickness was increased in a pattern of mild LVH.- Aortic valve: AV is thckened, calcified with no signifi stenosis.The atrium was severely dilated.: Systolic function was moderately reduced.The atrium was mildly dilated.PA peak pre  Nonischemic cardiomyopathy (HCC)   Palpitations   Pneumonia   PVC (premature ventricular contraction) 07/02/2011  PVC's (premature ventricular contractions)   Wheezing 04/29/2011  Sinus ct 05/2011:   Wheezing  Past Surgical History: Past Surgical History: Procedure Laterality Date  BI-VENTRICULAR IMPLANTABLE CARDIOVERTER DEFIBRILLATOR N/A 02/13/2012  Procedure: BI-VENTRICULAR IMPLANTABLE CARDIOVERTER DEFIBRILLATOR  (CRT-D);  Surgeon: Hillis Range, MD;  Location: [facility] CATH LAB;  Service: Cardiovascular;  Laterality: N/A;  BI-VENTRICULAR IMPLANTABLE CARDIOVERTER DEFIBRILLATOR  (CRT-D)  02/13/2012  BI-VENTRICULAR IMPLANTABLE CARDIOVERTER DEFIBRILLATOR  (CRT-D)  05/03/2019  CHANGEOUT  BIV ICD GENERATOR CHANGEOUT N/A 05/03/2019  Procedure: BIV ICD GENERATOR CHANGEOUT;  Surgeon: Hillis Range, MD;  Location: [facility] INVASIVE CV LAB;  Service: Cardiovascular;  Laterality: N/A;  CARDIAC CATHETERIZATION  2013  CARDIAC DEFIBRILLATOR PLACEMENT  02/13/2012  SJM [employer] BIV ICD implanted by Dr Johney Frame  IRRIGATION AND DEBRIDEMENT ELBOW Right 05/08/2023  Procedure: IRRIGATION AND DEBRIDEMENT ELBOW;  Surgeon: Roby Lofts, MD;  Location: [facility] OR;  Service: Orthopedics;  Laterality: Right;  IRRIGATION  AND DEBRIDEMENT ELBOW Right 04/24/2023  Procedure: IRRIGATION AND DEBRIDEMENT ELBOW;  Surgeon: Roby Lofts, MD;  Location: [facility] OR;  Service: Orthopedics;  Laterality: Right;  NASAL SINUS SURGERY  93, 97, 2010, 2006  NASAL SINUS SURGERY    1993, 1997, 2006, 2010  ORIF PELVIC FRACTURE Right 05/08/2023  Procedure: OPEN REDUCTION INTERNAL FIXATION (ORIF) PELVIC FRACTURE;  Surgeon: Roby Lofts, MD;  Location: [facility] OR;  Service: Orthopedics;  Laterality: Right;  ORIF PELVIC FRACTURE WITH PERCUTANEOUS SCREWS Bilateral 04/24/2023  Procedure: ORIF PELVIC FRACTURE WITH PERCUTANEOUS SCREWS;  Surgeon: Jena Gauss,  Gillie Manners, MD;  Location: [facility] OR;  Service: Orthopedics;  Laterality: Bilateral; Cassidi Sockwell M.A., CF-SLP 05/27/2023, 12:51 PM  Recent Labs    05/28/23 0610  WBC 11.1*  HGB 12.9*  HCT 41.4  PLT 290    Recent Labs    05/28/23 0610  NA 136  K 4.2  CL 98  CO2 27  GLUCOSE 128*  BUN 19  CREATININE 0.70  CALCIUM 9.3     Intake/Output Summary (Last 24 hours) at 05/28/2023 1307 Last data filed at 05/28/2023 1200 Gross per 24 hour  Intake 480 ml  Output 700 ml  Net -220 ml        Physical Exam: Vital Signs Blood pressure (!) 140/66, pulse 72, temperature 97.7 F (36.5 C), temperature source Oral, resp. rate 16, height 5\' 10"  (1.778 m), weight 106.1 kg, SpO2 94%.   General: No apparent distress HEENT: MMM, EOMI Neck: Supple without JVD or lymphadenopathy Heart: Reg rate and rhythm.  Chest: CTAB, no increased WOB, on [place] 2L Abdomen: Soft, mildly-tender, non-distended, bowel sounds positive. Extremities: No clubbing, cyanosis, or edema. Pulses are 2+ Psych: Pt's affect is appropriate. Pt is cooperative.   Musculoskeletal:  Comments: Right elbow wound with dry dressing in place Prepubic and right lower quadrant incisions C/D/I  Neurological:     Mental Status: He is oriented to person, place, and time. Follows commands. + memory deficits          Assessment/Plan: 1.  Functional deficits which require 3+ hours per day of interdisciplinary therapy in a comprehensive inpatient rehab setting. Physiatrist is providing close team supervision and 24 hour management of active medical problems listed below. Physiatrist and rehab team continue to assess barriers to discharge/monitor patient progress toward functional and medical goals  Care Tool:  Bathing    Body parts bathed by patient: Right arm, Left arm, Chest, Abdomen, Face   Body parts bathed by helper: Front perineal area, Buttocks, Right upper leg, Left upper leg, Right lower leg, Left lower leg     Bathing assist Assist Level: Total Assistance - Patient < 25%     Upper Body Dressing/Undressing Upper body dressing   What is the patient wearing?: Hospital gown only    Upper body assist Assist Level: Moderate Assistance - Patient 50 - 74%    Lower Body Dressing/Undressing Lower body dressing      What is the patient wearing?: Hospital gown only, Incontinence brief     Lower body assist Assist for lower body dressing: Dependent - Patient 0%     Toileting Toileting    Toileting assist Assist for toileting: Dependent - Patient 0%     Transfers Chair/bed transfer  Transfers assist     Chair/bed transfer assist level: Minimal Assistance - Patient > 75% (slide board B LE NWB +2 min A)     Locomotion Ambulation   Ambulation assist   Ambulation activity did not occur: Safety/medical concerns          Walk 10 feet activity   Assist  Walk 10 feet activity did not occur: Safety/medical concerns        Walk 50 feet activity   Assist Walk 50 feet with 2 turns activity did not occur: Safety/medical concerns         Walk 150 feet activity   Assist Walk 150 feet activity did not occur: Safety/medical concerns         Walk 10 feet on uneven surface  activity   Assist Walk 10  feet on uneven surfaces activity did not occur: Safety/medical concerns          Wheelchair     Assist Is the patient using a wheelchair?: Yes Type of Wheelchair: Manual    Wheelchair assist level: Supervision/Verbal cueing Max wheelchair distance: 150    Wheelchair 50 feet with 2 turns activity    Assist        Assist Level: Supervision/Verbal cueing   Wheelchair 150 feet activity     Assist      Assist Level: Supervision/Verbal cueing   Blood pressure (!) 140/66, pulse 72, temperature 97.7 F (36.5 C), temperature source Oral, resp. rate 16, height 5\' 10"  (1.778 m), weight 106.1 kg, SpO2 94%.  Medical Problem List and Plan: 1. Functional deficits secondary to polytrauma             -patient may shower             -ELOS/Goals: 14-20 days S with PT/OT             -Continue CIR -SLP consult for cognition-felt to be at baseline, slums score of 21 out of 30 -Team conference today please see physician documentation under team conference tab, met with team  to discuss problems,progress, and goals. Formulized individual treatment plan based on medical history, underlying problem and comorbidities.  --Dr. Jena Gauss rec NWB, slide board transfers, only 1 session of therapy a day.  Recommended consideration of SNF, patient is not interested SNF.  He has several family members coming to help -QD therapy  -Estimated discharge 11/11   2.  Antithrombotics: -DVT/anticoagulation:  Pharmaceutical: Lovenox 30mg  BID, Eliquis 2.5 mg twice daily for 30 days on discharge for DVT prophylaxis             -antiplatelet therapy: N/A 3. Pain Management: continue tylenol 650mg  q6h, robaxin 1000mg  q8h, Oxycodone as needed severe pain -10/25 Will schedule oxycodone 5 mg at 7 AM, gabapentin 100 mg 3 times daily started -05/23/23 mildly sore throat, cepacol ordered -Gabapentin increased to 300 mg 3 times daily 11/4, Xray Pelvix ordered, Robaxin dose was decreased to 750 mg -05/26/23 continue current pain regimen  05/27/23 pain control today, continue to monitor and  continue current pain management 11/7 pain controlled overall today, continue current regimen 4. Mood/Behavior/Sleep: LCSW to follow for evaluation and support             -antipsychotic agents: Seroquel 50mg  as needed  -Lorazepam 0.5mg  QHS -05/16/23 sleeping fair but wants to try increased melatonin dose, inc to 5mg  at bedtime, monitor sleep for now-- improved 05/17/23! -05/18/2023 will increase Seroquel to 75 mg at bedtime -10/31 Increase seroquel to 100mg  HS  -Sleep-wake chart ordered -05/23/23 didn't sleep well but didn't get seroquel-- wrote it down for him to ask, discussed with nursing to also ask if he wants it tonight-- helped 05/24/23! -05/25/23 new order placed for sleep-wake chart -05/26/23 discussed sleep-wake monitoring with nursing, will increase lorazepam to 1 mg. Discussed options with pharmacy appreciate assistance -11/6 appears he is sleeping most of the night, will adjust schedule of his insomnia medications to start a little earlier.  Possibly memory deficits contributing to his feeling that he is having a hard time sleeping.  11/7 sleep chart indicates he has been sleeping okay overall, continue current regimen for now 5. Neuropsych/cognition: This patient is  capable of making decisions on his own behalf. 6. Skin/Wound Care: Routine pressure-relief measures.   --Monitor right elbow for drainage/healing. 7. Fluids/Electrolytes/Nutrition: Monitor I/os.  Continue  Ensure supplements.             -- Add vitamin C/D3, MVI, zinc and Juven. 8. Pelvic ring fracture s/p percutaneous fixation: Strict NWB RLE and WBAT LLE for transfers only.  -- hardware loosening noted, NWB b/l LE, slide board transfers  -Air mattress for wound prevention  -11/5 x-ray Pelvis yesterday stable  11/7 with BX reviewed images 11/4.  Continue nonweightbearing bilateral lower extremities, okay for slide board transfers per Ortho.  1 therapy session today.  Plan for repeat x-rays in about 2 weeks outpatient  with Ortho. 9. Right elbow wound drainage: Now on doxycycline.  Reports night sweats.  Cultures sent -Discussed with Dr. Jena Gauss yesterday who recommended Doxycycline 100 mg twice daily, his team plans to follow-up and check on him and CIR.   -10/25 wound VAC started by Ortho appreciate assistance, continue current antibiotics, will recheck labs tomorrow -05/16/23 BCx and elbow Cx NGTD x3 days, WBC down to 10.4 today; monitor-- still NGTD 05/17/23 -10/28 WBC 12.0 today, continue doxycycline and wound VAC -10/30 wound vac removed by ortho -10/31 wound with small amount of draining, continue to monitor and continue abx, WBC down to 10.9 -11/1 orthopedics contacted regarding wound, continue antibiotics and monitor for now  11/6 consider discontinuation of antibiotics  11/7 will contact orthopedics regarding continuation of antibiotics 10. Acute blood loss anemia: Monitor for any signs of bleeding.  Hgb stable 10.1 on 05/21/23  11. Leukocytosis: White count trending down from 23.6-->12.9>> 10.1 10/26; monitor routinely   -11/7 WBC a little higher today at 11.1 12.  Thrombocytosis: Likely reactive.  Continue to monitor.  446 on 10/28 13. Low protein stores: Albumin down to 2.2--supplements added. 14. H/o NICM/HF s/p ICD:  Daily weights/monitor for signs of overload. Change diet to heart healthy.  No signs of fluid overload -- On Coreg 25mg  BID, aldactone 25mg  QD, Lasix 20mg  BID, and Pravachol 80mg  daily, Zetia 10mg  daily             --decrease Kdur to 20 meq bid as K trending up.  --Repeat chest x-ray in a.m.- no acute findings -10/29 recheck chest x-ray today- no acute changes -11/1 patient denies any shortness of breath, reports cough and congestion is better today -11/7 BP stable overall, continue to monitor.  Patient was started on O2 nasal cannula late this morning.  Patient denies shortness of breath.  O2 nasal cannula has been removed since this time and appears sats have been okay chest  x-ray without acute findings Filed Weights   05/27/23 0421 05/27/23 1040 05/28/23 0446  Weight: 107 kg 107.5 kg 106.1 kg      05/28/2023   12:37 PM 05/28/2023    6:39 AM 05/28/2023    6:25 AM  Vitals with BMI  Systolic 140 136   Diastolic 66 62   Pulse 72 66 67     15. Multiple rib fractures: Encourage pulmonary hygiene. Incentive spirometer ordered  -10/29 patient bringing up more mucus today, may be due to the Mucinex   16. Urinary retention: Monitor voiding with PVR checks.  continue Flomax 0.8mg  daily.  -10/24 Required IC this AM, continue to monitor  PVRs  -Timed voids every 4 hours, consider UA if urinary symptoms worsen -05/16/23 PVRs decreasing, hasn't needed cath since yesterday; monitor -05/17/23 needed cath last night for and in the afternoon yesterday; defer to weekday team regarding management of this -10/31 intermittently incontinent of bladder, suspect limited mobility is contributing.  Elevated bladder scan  noted by nursing when I came in the room.  He denied sensation of needing to void.  We discussed offering him a urinal to try to void.  Appears he had a PVR later in the morning with 9 mL.  Timed voids ordered. -11/5 has not required IC in couple days, continue to monitor -11/6 discontinued PVRs   17. Constipation. Appears to be having regular Bms.   -Cont miralax prn -05/17/23 last BM this morning but loose, no stool softeners being given, likely from doxycycline, monitor for ongoing diarrhea -11/1 LBM yesterday but small, will start MiraLAX daily, sorbitol as needed ordered -05/23/23 LBM yesterday but pt reports no good one in a couple days, will increase miralax BID for now.  -05/24/23 LBM this morning, cont regimen -11/6 LBM today  18.  Dysphagia.  SLP consult placed.  Diet decreased to D3 for now  -Unclear cause of dysphagia, may be more chronic issue.  Continue G-tube thin diet.  Had MBS today.  Continue SLP.  LOS: 15 days A FACE TO FACE  EVALUATION WAS PERFORMED  Fanny Dance 05/28/2023, 1:07 PM

## 2023-05-28 NOTE — Progress Notes (Signed)
Occupational Therapy Session Note  Patient Details  Name: Louis Juarez. MRN: [id number] Date of Birth: January 02, 1941  Today's Date: 05/28/2023 OT Individual Time: 4098-1191 OT Individual Time Calculation (min): 49 min    Short Term Goals: Week 2:  OT Short Term Goal 1 (Week 2): Pt will perform squat-pivot transfer to BSC/toilet with consistent Min A. OT Short Term Goal 1 - Progress (Week 2): Discontinued (comment) (Goal discontinued due to NWB BLE precautions (~10/30).) OT Short Term Goal 2 (Week 2): Pt will manage LB dressing with consistent Mod A + LRAD. OT Short Term Goal 2 - Progress (Week 2): Met OT Short Term Goal 3 (Week 2): Pt will complete LB bathing with consistent Min A + LRAD. OT Short Term Goal 3 - Progress (Week 2): Partly met  Skilled Therapeutic Interventions/Progress Updates:   Pt on 2 ltrs O2 via [place] while bed level upon OT and pt's son and wife arrival. MD arrived for rounds and reported O2 added for some SOB earlier in night. Chest xray ordered and MD ok's off O2 for therapy monitored for sats. Sats 90-94% with O2. Removed while OT provided Family Education session re: DABSC set up and use with splash ring and over the toilet placement and next to bed/w/c including drop arm component and height adjustment. Training for w/c operation with pt's new w/c arrived especially with leg rest operation and elevation. + 2 TT arrival and pt able to transfer with wife support and clinician guided transfer board lateral transfer bed to w/c with cues for board placement and use of arms vs sliding across board. Pt requires LE support to maintain NWB prec and son and wife able to teach back. OT discussed best timing and location for commode use, use of urinal bed level, LE clothing and hygiene management bed level and use of gait belt at all times. Sink side UB self care with set up and light B UE red tband therex for triceps press. OT will provide written UE HEP for discharge carryover. Left  pt with family in w/c with all needs and safety measures.   Pain: 4/10 at rest and post session pelvis   Therapy Documentation Precautions:  Precautions Precautions: Fall Restrictions Weight Bearing Restrictions: Yes RUE Weight Bearing: Weight bearing as tolerated RLE Weight Bearing: Non weight bearing LLE Weight Bearing: Non weight bearing Other Position/Activity Restrictions: NWB BLE   Therapy/Group: Individual Therapy  Vicenta Dunning 05/28/2023, 7:57 AM

## 2023-05-28 NOTE — Progress Notes (Signed)
Patient's SPO2 on room air is 88%. Patient breathing is regular and non-labored. Patient denies shortness of breath and is not showing any signs of discomfort or distress. Crackles auscultated. PA-C made aware. Per PA-C administer albuterol and order 2 view CXR.

## 2023-05-28 NOTE — Progress Notes (Addendum)
Physical Therapy Weekly Progress Note  Patient Details  Name: Louis Juarez. MRN: [id number] Date of Birth: [birth year]  Beginning of progress report period: May 20, 2023 End of progress report period: May 28, 2023  Today's Date: 05/28/2023 PT Individual Time: 1112-1201 PT Individual Time Calculation (min): 49 min   Patient has met 3 of 3 short term goals.  Pt is performing bed mobility with use of bed features and max verbal cues provided for technique for safety especially for sit to supine. Pt performing slide board transfer bed to [facility] with min A for maintenance of B LE NWBing precautions, with maximal instruction cues. Pt only able to recall B LE NWBing precautions ~10% of the time. Family training scheduled for 11/7 with pt wife and son. Discharge planned for 11/11. Recommended equipment: WC, slide board, hospital bed. Pt family plans to use EMS transport home 2/2 to 10 steps for home entry. Recommending installation of ramp for accessibility in and out of house for follow up appointments.   Patient continues to demonstrate the following deficits muscle weakness, decreased cardiorespiratoy endurance and decreased oxygen support, decreased safety awareness and decreased memory, and decreased sitting balance, decreased balance strategies, and difficulty maintaining precautions and therefore will continue to benefit from skilled PT intervention to increase functional independence with mobility.  Patient progressing toward long term goals..  Continue plan of care.  PT Short Term Goals Week 2:  PT Short Term Goal 1 (Week 2): pt will perform supine to sit and sit to supine with min A or less consistently PT Short Term Goal 1 - Progress (Week 2): Met PT Short Term Goal 2 (Week 2): pt will perform slide board transfer with mod A or less consistently PT Short Term Goal 2 - Progress (Week 2): Met PT Short Term Goal 3 (Week 2): Pt will perform dynamic sitting balance with min A or less PT  Short Term Goal 3 - Progress (Week 2): Met Week 3:  PT Short Term Goal 1 (Week 3): STG=LTG 2/2 ELOS  Skilled Therapeutic Interventions/Progress Updates:      Pt seated in [facility] upon arrival with wife and son present for family training. Pt agreeable to therapy. Pt denies any pain initially, reporting gas pains at end of session, notified nursing.   Pt reports need to use bathroom.   RA O2 90 and above throughout session.   Therapist reviewed set up for slide board transfer including:   Removing leg rests  Positioning WC   Locking brakes   Removing arm rest on WC and arm rest on [facility]   Placing slide board with emphasis on use of dycem on BSC to hold slide board in position   Therapist reviewed pt B LE NWBing precautions, and need to assist pt to hold them up with slide board transfer.   Pt and son reports they assisted with slide board transfer bed to [facility] during OT session. Pt and son assisted with slide board transfer WC to [facility] with close supervision from therapist.   Pt continent of bowel (small). Pt performed lateral lean bilaterally and use of wheelchair pushup with pt wife lifting legs off of floor to maintain NWBing precautions while pt son assisted with pulling up/down pants. Education provided on toileting with bed pan and urinal with increased pain/fatigue, or if only one caregiver is available. Pt and pt wife and son verbalized understanding and agreeable.   Pt demo improved carryover of slide board transfer BSC to WC, WC to hospital  bed, with supervisioin from therapist, therapist emphasizing cues to assist pt to reduce pt confusion.   Pt wife reports plan to install ramp for home entry, but will utilize EMS transfer to get home until ramp is installed.   Therapist provided education on importance of pressure relief for skin integrity. Education provided to change position every 2 hours when in bed.  Therapist provided education on importance of exercise for strength/ROM/contracture  prevention. Education provided that pt will provide pt with HEP handout prior to discharge.   Pt, pt wife and son deny any additional questions/concerns.      Therapy Documentation Precautions:  Precautions Precautions: Fall Restrictions Weight Bearing Restrictions: Yes RUE Weight Bearing: Weight bearing as tolerated RLE Weight Bearing: Non weight bearing LLE Weight Bearing: Non weight bearing Other Position/Activity Restrictions: NWB BLE  Therapy/Group: Individual Therapy  [facility] [name], [place], DPT  05/28/2023, 7:43 AM

## 2023-05-28 NOTE — Progress Notes (Signed)
SPO2 is 88% on room air after breathing treatment. Patient is not showing any signs of discomfort or distress. Breathing is regular and non-labored. PA-C made aware. Order received for 2 liter nasal cannula. SPO2 currently is 92% on 2 liters nasal cannula. No distress noted.

## 2023-05-28 NOTE — Progress Notes (Signed)
Speech Language Pathology Daily Session Note  Patient Details  Name: Louis Juarez. MRN: [id number] Date of Birth: October 21, 1940  Today's Date: 05/28/2023 SLP Individual Time: 1030-1100 SLP Individual Time Calculation (min): 30 min  Short Term Goals: Week 1: SLP Short Term Goal 1 (Week 1): Patient will participate in an MBS to assess oropharyngeal swallow functioning  Skilled Therapeutic Interventions: Skilled ST session focused on family education. SLP educated family on MBS results and explained oropharyngeal anatomy and function. SLP reviewed current diet recommendation of D2/thin liquids and strategies including intermittent throat clear, multiple swallows and no straws. Recommend medications crushed in puree. Handout provided regarding information presented. Family verbalized understanding with no further questions. Patient left in [facility] with alarm set and call bell in reach. Family present. Continue POC.  Pain None reported   Therapy/Group: Individual Therapy  Zaxton Angerer M.A., CF-SLP 05/28/2023, 1:15 PM

## 2023-05-29 ENCOUNTER — Other Ambulatory Visit (HOSPITAL_COMMUNITY): Payer: Self-pay

## 2023-05-29 MED ORDER — ASCORBIC ACID 500 MG PO TABS
500.0000 mg | ORAL_TABLET | Freq: Every day | ORAL | 0 refills | Status: DC
  Filled 2023-05-29: qty 30, 30d supply, fill #0

## 2023-05-29 MED ORDER — PANTOPRAZOLE SODIUM 40 MG PO TBEC
40.0000 mg | DELAYED_RELEASE_TABLET | Freq: Every day | ORAL | 0 refills | Status: DC
  Filled 2023-05-29: qty 30, 30d supply, fill #0

## 2023-05-29 MED ORDER — TAMSULOSIN HCL 0.4 MG PO CAPS
0.4000 mg | ORAL_CAPSULE | Freq: Every day | ORAL | 0 refills | Status: DC
  Filled 2023-05-29: qty 30, 30d supply, fill #0

## 2023-05-29 MED ORDER — SPIRONOLACTONE 25 MG PO TABS
25.0000 mg | ORAL_TABLET | Freq: Every day | ORAL | 0 refills | Status: DC
  Filled 2023-05-29: qty 30, 30d supply, fill #0

## 2023-05-29 MED ORDER — CARVEDILOL 25 MG PO TABS
25.0000 mg | ORAL_TABLET | Freq: Two times a day (BID) | ORAL | 0 refills | Status: DC
  Filled 2023-05-29: qty 60, 30d supply, fill #0

## 2023-05-29 MED ORDER — MELATONIN 5 MG PO TABS
5.0000 mg | ORAL_TABLET | Freq: Every day | ORAL | 0 refills | Status: DC
  Filled 2023-05-29: qty 30, 30d supply, fill #0

## 2023-05-29 MED ORDER — OXYCODONE HCL 10 MG PO TABS
10.0000 mg | ORAL_TABLET | ORAL | 0 refills | Status: DC | PRN
  Filled 2023-05-29: qty 35, 6d supply, fill #0

## 2023-05-29 MED ORDER — FUROSEMIDE 20 MG PO TABS
20.0000 mg | ORAL_TABLET | Freq: Two times a day (BID) | ORAL | 0 refills | Status: DC
  Filled 2023-05-29: qty 60, 30d supply, fill #0

## 2023-05-29 MED ORDER — GABAPENTIN 300 MG PO CAPS
300.0000 mg | ORAL_CAPSULE | Freq: Three times a day (TID) | ORAL | 0 refills | Status: DC
  Filled 2023-05-29: qty 90, 30d supply, fill #0

## 2023-05-29 MED ORDER — QUETIAPINE FUMARATE 100 MG PO TABS
100.0000 mg | ORAL_TABLET | Freq: Every day | ORAL | 0 refills | Status: DC
  Filled 2023-05-29: qty 30, 30d supply, fill #0

## 2023-05-29 MED ORDER — VITAMIN D3 25 MCG PO TABS
2000.0000 [IU] | ORAL_TABLET | Freq: Every day | ORAL | 0 refills | Status: AC
  Filled 2023-05-29: qty 30, 15d supply, fill #0

## 2023-05-29 MED ORDER — EZETIMIBE 10 MG PO TABS
10.0000 mg | ORAL_TABLET | Freq: Every day | ORAL | 0 refills | Status: DC
  Filled 2023-05-29: qty 30, 30d supply, fill #0

## 2023-05-29 MED ORDER — POTASSIUM CHLORIDE CRYS ER 20 MEQ PO TBCR
20.0000 meq | EXTENDED_RELEASE_TABLET | Freq: Two times a day (BID) | ORAL | 0 refills | Status: DC
  Filled 2023-05-29: qty 60, 30d supply, fill #0

## 2023-05-29 MED ORDER — LORAZEPAM 1 MG PO TABS: 1.0000 mg | ORAL_TABLET | Freq: Every day | ORAL | Status: DC

## 2023-05-29 MED ORDER — SERTRALINE HCL 25 MG PO TABS
25.0000 mg | ORAL_TABLET | Freq: Every day | ORAL | 0 refills | Status: DC
  Filled 2023-05-29: qty 30, 30d supply, fill #0

## 2023-05-29 MED ORDER — POLYETHYLENE GLYCOL 3350 17 GM/SCOOP PO POWD
17.0000 g | Freq: Two times a day (BID) | ORAL | 0 refills | Status: DC
  Filled 2023-05-29: qty 238, 7d supply, fill #0

## 2023-05-29 MED ORDER — APIXABAN 2.5 MG PO TABS
2.5000 mg | ORAL_TABLET | Freq: Two times a day (BID) | ORAL | 0 refills | Status: DC
  Filled 2023-05-29: qty 60, 30d supply, fill #0

## 2023-05-29 MED ORDER — ZINC SULFATE 220 (50 ZN) MG PO TABS
220.0000 mg | ORAL_TABLET | Freq: Every day | ORAL | 0 refills | Status: AC
  Filled 2023-05-29: qty 30, 30d supply, fill #0

## 2023-05-29 MED ORDER — ACETAMINOPHEN 325 MG PO TABS
650.0000 mg | ORAL_TABLET | Freq: Four times a day (QID) | ORAL | 0 refills | Status: DC
  Filled 2023-05-29: qty 150, 19d supply, fill #0

## 2023-05-29 MED ORDER — METHOCARBAMOL 750 MG PO TABS
750.0000 mg | ORAL_TABLET | Freq: Three times a day (TID) | ORAL | 0 refills | Status: DC
  Filled 2023-05-29: qty 90, 30d supply, fill #0

## 2023-05-29 NOTE — Plan of Care (Signed)
  Problem: Consults Goal: RH GENERAL PATIENT EDUCATION Description: See Patient Education module for education specifics. Outcome: Progressing Goal: Skin Care Protocol Initiated - if Braden Score 18 or less Description: If consults are not indicated, leave blank or document N/A Outcome: Progressing   Problem: RH BOWEL ELIMINATION Goal: RH STG MANAGE BOWEL WITH ASSISTANCE Description: STG Manage Bowel with min Assistance. Outcome: Progressing Goal: RH STG MANAGE BOWEL W/MEDICATION W/ASSISTANCE Description: STG Manage Bowel with Medication with min Assistance. Outcome: Progressing   Problem: RH BLADDER ELIMINATION Goal: RH STG MANAGE BLADDER WITH ASSISTANCE Description: STG Manage Bladder With min Assistance Outcome: Progressing Goal: RH STG MANAGE BLADDER WITH MEDICATION WITH ASSISTANCE Description: STG Manage Bladder With Medication With min Assistance. Outcome: Progressing   Problem: RH SKIN INTEGRITY Goal: RH STG SKIN FREE OF INFECTION/BREAKDOWN Description: Incisions will continue to heal and be free of infection/breakdown with min assist  Outcome: Progressing Goal: RH STG MAINTAIN SKIN INTEGRITY WITH ASSISTANCE Description: STG Maintain Skin Integrity With min Assistance. Outcome: Progressing Goal: RH STG ABLE TO PERFORM INCISION/WOUND CARE W/ASSISTANCE Description: STG Able To Perform Incision/Wound Care With min Assistance. Outcome: Progressing   Problem: RH SAFETY Goal: RH STG ADHERE TO SAFETY PRECAUTIONS W/ASSISTANCE/DEVICE Description: STG Adhere to Safety Precautions With cueing Assistance/Device. Outcome: Progressing Goal: RH STG DECREASED RISK OF FALL WITH ASSISTANCE Description: STG Decreased Risk of Fall With min Assistance. Outcome: Progressing   Problem: RH PAIN MANAGEMENT Goal: RH STG PAIN MANAGED AT OR BELOW PT'S PAIN GOAL Description: Less than 4 with PRN medications min assist  Outcome: Progressing   Problem: RH KNOWLEDGE DEFICIT GENERAL Goal: RH  STG INCREASE KNOWLEDGE OF SELF CARE AFTER HOSPITALIZATION Description: Patient/caregiver will be able to manage medications, self care, weight bearing precautions, and diet/lifestyle modifications to improve health from nursing education, nursing handouts, and other resources independently  Outcome: Progressing
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
## 2023-05-29 NOTE — Progress Notes (Signed)
Physical Therapy Session Note  Patient Details  Name: Louis Juarez. MRN: [id number] Date of Birth: [birth year]  Today's Date: 05/29/2023 PT Individual Time: 1300-1326 PT Individual Time Calculation (min): 26 min   Short Term Goals: Week 2:  PT Short Term Goal 1 (Week 2): pt will perform supine to sit and sit to supine with min A or less consistently PT Short Term Goal 1 - Progress (Week 2): Met PT Short Term Goal 2 (Week 2): pt will perform slide board transfer with mod A or less consistently PT Short Term Goal 2 - Progress (Week 2): Met PT Short Term Goal 3 (Week 2): Pt will perform dynamic sitting balance with min A or less PT Short Term Goal 3 - Progress (Week 2): Met  Skilled Therapeutic Interventions/Progress Updates:      Pt in bed - using call bell to call for nursing assistance as he spilt some of the urinal on his clothes - continent of bladder. Assist for changing his briefs/shorts at bed level - completed by rolling in bed using bed rails and supervision.   Bed level there-ex completed: -1x15 heel slides -1x15 hip abd/add -1x15 SLR -1x15 glut sets -1x30 ankle pumps *rest breaks b/w sets.  Pt ended treatment in bed, all needs met.    Therapy Documentation Precautions:  Precautions Precautions: Fall Restrictions Weight Bearing Restrictions: Yes RUE Weight Bearing: Weight bearing as tolerated RLE Weight Bearing: Non weight bearing LLE Weight Bearing: Non weight bearing Other Position/Activity Restrictions: NWB BLE General:   Vital Signs: Therapy Vitals Temp: 97.6 F (36.4 C) Pulse Rate: 64 Resp: 18 BP: 138/60 Patient Position (if appropriate): Lying Oxygen Therapy SpO2: 90 % O2 Device: Room Air Pain:   Mobility:   Locomotion :    Trunk/Postural Assessment :    Balance:   Exercises:   Other Treatments:      Therapy/Group: Individual Therapy  Orrin Brigham 05/29/2023, 7:50 AM

## 2023-05-29 NOTE — Plan of Care (Signed)
  Problem: Consults Goal: RH GENERAL PATIENT EDUCATION Description See Patient Education module for education specifics. Outcome: Progressing   Problem: RH BOWEL ELIMINATION Goal: RH STG MANAGE BOWEL WITH ASSISTANCE Description STG Manage Bowel with min Assistance.   Outcome: Progressing   Problem: RH BLADDER ELIMINATION Goal: RH STG MANAGE BLADDER WITH ASSISTANCE Description STG Manage Bladder With min. Assistance   Outcome: Progressing

## 2023-05-29 NOTE — Progress Notes (Signed)
Recreational Therapy Session Note  Patient Details  Name: Louis Juarez. MRN: [id number] Date of Birth: [birth year] Today's Date: 05/29/2023  Eval defered due to CTRS/LRT availability and pt upcoming discharge.  Louis Juarez 05/29/2023, 8:15 AM

## 2023-05-29 NOTE — Progress Notes (Signed)
Patient ID: Louis Juarez., male   DOB: 1941-03-28, 82 y.o.   MRN: [id number] Let wife know PTAR set up for 10;30-11:00 on Monday. Wife will take home rest of DME in the room over the weekend. Pt is ready to go home.

## 2023-05-30 ENCOUNTER — Other Ambulatory Visit (HOSPITAL_COMMUNITY): Payer: Self-pay

## 2023-05-30 DIAGNOSIS — T8149XA Infection following a procedure, other surgical site, initial encounter: Secondary | ICD-10-CM

## 2023-05-30 MED ORDER — DOXYCYCLINE HYCLATE 100 MG PO TABS
100.0000 mg | ORAL_TABLET | Freq: Two times a day (BID) | ORAL | Status: DC
  Administered 2023-05-30 – 2023-06-01 (×5): 100 mg via ORAL
  Filled 2023-05-30 (×5): qty 1

## 2023-05-30 MED ORDER — DOXYCYCLINE HYCLATE 100 MG PO TABS
100.0000 mg | ORAL_TABLET | Freq: Two times a day (BID) | ORAL | 0 refills | Status: DC
  Filled 2023-05-30: qty 10, 5d supply, fill #0

## 2023-05-30 MED ORDER — MELATONIN 5 MG PO TABS
10.0000 mg | ORAL_TABLET | Freq: Every day | ORAL | Status: DC
  Administered 2023-05-30 – 2023-05-31 (×2): 10 mg via ORAL
  Filled 2023-05-30 (×2): qty 2

## 2023-05-30 NOTE — Progress Notes (Addendum)
Patient asleep and oxygen spot checked about 0120. Oxygen 87-88% on RA. 2L [place] applied per order and oxygen up to 94-95%. Lungs diminished. No ronchi noted however in the a/m some crackles noted to LUL.  Patient sleeps reclined with mouth opened. Patient breathing regular and nonlabored-no signs of distress.

## 2023-05-30 NOTE — Progress Notes (Deleted)
Inpatient Rehabilitation Discharge Medication Review by a Pharmacist  A complete drug regimen review was completed for this patient to identify any potential clinically significant medication issues.  High Risk Drug Classes Is patient taking? Indication by Medication  Antipsychotic Yes Quetiapine - agitation  Anticoagulant Yes Apixaban - DVT px  Antibiotic No Doxycycline - wound infection  Opioid Yes Oxycodone - PRN pain  Antiplatelet No   Hypoglycemics/insulin No   Vasoactive Medication Yes, as an intravenous medication Carvedilol- HTN Furosemide, spironolactone - CHF  Chemotherapy No   Other Yes Vit D, Kcl, MVI, vit C, zinc - supplement Albuterol - PRN SOB Ezetimibe, pravastatin - HLD Tamsulosin - urinary retention Lorazepam, melatonin - sleep Methocarbamol - muscle spasms Levocetirizine - allergic rhinitis Pantoprazole - GERD ppx Neurontin - neuropathy Zoloft - MDD     Type of Medication Issue Identified Description of Issue Recommendation(s)  Drug Interaction(s) (clinically significant)     Duplicate Therapy     Allergy     No Medication Administration End Date     Incorrect Dose     Additional Drug Therapy Needed     Significant med changes from prior encounter (inform family/care partners about these prior to discharge). Amitriptyline, colesevelam, losartan, mirabegron, zolpidem stopped at CIR admission > resume as able in CIR Communicate medication changes with patient/family at discharge  Other       Clinically significant medication issues were identified that warrant physician communication and completion of prescribed/recommended actions by midnight of the next day:  No   Pharmacist comments: n/a   Time spent performing this drug regimen review (minutes): 20   Ulyses Southward, PharmD, [place], AAHIVP, CPP Infectious Disease Pharmacist 05/30/2023 2:29 PM

## 2023-05-30 NOTE — Progress Notes (Signed)
Orthopaedic Trauma Progress Note  SUBJECTIVE: Doing well today. Not having much pain in the elbow currently. All but 4 sutures were removed by nursing yesterday (05/28/23), and clean dry dressing was applied over the elbow.  Denies any fever or chills.  No nausea or vomiting.  Patient is eager for discharge on Monday.  No other complaints.   OBJECTIVE:  Vitals:   05/30/23 0118 05/30/23 0519  BP:  139/62  Pulse:  65  Resp:  18  Temp:  97.7 F (36.5 C)  SpO2: 94% 95%    General: Sitting up in bed, NAD Respiratory: No increased work of breathing.  RUE: Dressing removed, incision with 4 sutures in place through the central portion of the laceration.  2 Steri-Strips over the medial aspect laceration.  I removed the remaining sutures.  Patient tolerated this well.  No active drainage from the wound currently.  Wound edges loosely approximated, not fully healed.  Small amount of drainage on the gauze/Ace wrap.  Elbow slightly red in appearance but much improved from previous images/exams. Non-tender throughout extremity. Motor and sensory function intact distally. Hand warm and well perfused  Pelvis/BLE: Incisions R anterior and lateral hip are CDI. Non-tender over these areas. Ankle DF/PF intact bilaterally. +EHL/FHL. Endorses sensation to light touch all aspect of foot bilaterally. +DP pulses, equal  IMAGING: Repeat imaging 05/25/2023 remained stable   LABS:  No results found for this or any previous visit (from the past 24 hour(s)).   ASSESSMENT: Louis Juarez. is a 82 y.o. male s/p REVISION FIXATION PELVIS 05/08/2023 REPEAT IRRIGATION AND DEBRIDEMENT RIGHT ELBOW 05/08/2023  CV/Blood loss: Hemoglobin 12.9 on 05/28/2023.  Stable.  Hemodynamically stable  PLAN: Weightbearing:  RLE - NWB RLE BLE - NWB.  Okay for slide board transfers. ROM: BLE -unrestricted hip/knee/ankle ROM RUE -unrestricted ROM Incisional and dressing care:  RLE - okay to leave incisions open to air RUE -  sutures removed today.  Continue changing dressing as needed Showering: Okay to get lower extremity incisions wet.  Avoid getting RUE incision wet Orthopedic device(s): None Pain management: Continue current regimen VTE prophylaxis:  Lovenox , SCDs ID: Doxycycline stopped on 05/28/2023 Impediments to Fracture Healing: Vit D level 29, continue supplementation.    Dispo: Continue care per CIR.  Have removed remaining sutures today.  Will have patient remain off antibiotics for now.  There is no significant drainage at this point.  We will see how the wound does on its own.  Continue with dressing changes as needed.  D/C recommendations: - Oxycodone, Robaxin for pain control - Eliquis 2.5 mg BID x 30 days for DVT prophylaxis -Continue 2000 mg Vit D supplementation daily x 90 days  Follow - up plan: Tuesday, 06/09/2023 at 10:45 AM with Dr. Jena Gauss for repeat x-rays pelvis and reevaluation of right elbow wound.  Contact information:  Truitt Merle MD, Thyra Breed PA-C. After hours and holidays please check [email or web address] for group call information for [facility]   Thompson Caul, PA-C [phone] (office) [email or web address]

## 2023-05-30 NOTE — Progress Notes (Signed)
Speech Language Pathology Discharge Summary  Patient Details  Name: Louis Juarez. MRN: [id number] Date of Birth: [birth year]  Date of Discharge from SLP service:May 30, 2023  Today's Date: 05/30/2023 SLP Individual Time: 4098-1191 SLP Individual Time Calculation (min): 57 min   Skilled Therapeutic Interventions:   Skilled therapy session focused on dysphagia goals. SLP observed patient consume D2/thin liquid diet and provided supervision A for patient to utilize swallowing strategies. Swallowing strategies include small bites/sips, intermittent throat clear, liquid wash, and sitting upright at 90 degrees during meals. Patient with consistent throat clears throughout session, therefore unclear if due to bolus misdirection. Recommend continuation of current diet (D2/thin) per MBS results and clear CXR with medications crushed in puree and intermittent supervision. Patient left in bed with alarm set and call bell in reach. All questions answered and patient verbalized understanding of all dysphagia education provided.      Patient has met 1 of 1 long term goals.  Patient to discharge at overall Supervision level.  Reasons goals not met: n/a   Clinical Impression/Discharge Summary:  Pt has made good gains and has met 1 of 1 LTG's this admission due to improved dysphagia. Pt is currently an overall supervisionA for utilization of swallowing compensatory strategies to minimize overt s/sx of aspiration with D2/thin liquid diet. Pt/family education complete and pt will discharge home with supervision from friends/family/etc. No further ST intervention required at this time. Educated family/patient to contact PCP if noted increased difficulty in swallowing and/or increase in s/sx of aspiration.   Care Partner:  Caregiver Able to Provide Assistance: Yes  Type of Caregiver Assistance: Physical;Cognitive  Recommendation:  24 hour supervision/assistance      Equipment: n/a   Reasons for  discharge: Discharged from hospital;Treatment goals met   Patient/Family Agrees with Progress Made and Goals Achieved: Yes    Margaurite Salido M.A., CF-SLP 05/30/2023, 11:19 AM

## 2023-05-30 NOTE — Progress Notes (Signed)
PROGRESS NOTE   Subjective/Complaints:  Pt doing well, although reports he didn't sleep well. Sleep chart looks like he did sleep; wants to increase his melatonin tonight. Pain fairly well controlled. LBM yesterday. Urinating fine.  Nursing reports that there was some issue with his R elbow wound, sutures were removed but it still has some drainage.  Otherwise no other concerns or complaints.    ROS: Patient denies fever, rash, sore throat, blurred vision, dizziness, nausea, vomiting, diarrhea, constipation, cough, shortness of breath  headache, or mood change.  + Right pelvis area pain/ R lower leg pain-improved + Insomnia-patient continues to report + Chest congestion and cough-improved  + Dysphagia-reports a little improved today  Objective:   No results found. Recent Labs    05/28/23 0610  WBC 11.1*  HGB 12.9*  HCT 41.4  PLT 290    Recent Labs    05/28/23 0610  NA 136  K 4.2  CL 98  CO2 27  GLUCOSE 128*  BUN 19  CREATININE 0.70  CALCIUM 9.3     Intake/Output Summary (Last 24 hours) at 05/30/2023 0802 Last data filed at 05/30/2023 0801 Gross per 24 hour  Intake 600 ml  Output 1150 ml  Net -550 ml        Physical Exam: Vital Signs Blood pressure 139/62, pulse 65, temperature 97.7 F (36.5 C), temperature source Oral, resp. rate 18, height 5\' 10"  (1.778 m), weight 108.4 kg, SpO2 95%.   General: No apparent distress, sitting up in bed.  HEENT: MMM, EOMI Neck: Supple without JVD or lymphadenopathy Heart: Reg rate and rhythm. No m/r/g appreciated.  Chest: CTAB, no increased WOB, on room air today, no w/r/r appreciated. Abdomen: Soft, non-tender, non-distended, bowel sounds positive. Extremities: No clubbing, cyanosis, or edema. Pulses are 2+. R elbow wound now steristripped at medial aspect, some slough noted but small amount of purulence expressible from medial area, nontender, mildly erythematous  and warm to touch.  Psych: Pt is cooperative.  Patient is anxious appearing still  PRIOR EXAM: Musculoskeletal:  Comments: Right elbow wound with dry dressing in place Prepubic and right lower quadrant incisions C/D/I  Neurological:     Mental Status: He is oriented to person, place, and time. Follows commands. + memory deficits       Assessment/Plan: 1. Functional deficits which require 3+ hours per day of interdisciplinary therapy in a comprehensive inpatient rehab setting. Physiatrist is providing close team supervision and 24 hour management of active medical problems listed below. Physiatrist and rehab team continue to assess barriers to discharge/monitor patient progress toward functional and medical goals  Care Tool:  Bathing    Body parts bathed by patient: Right arm, Left arm, Chest, Abdomen, Face   Body parts bathed by helper: Front perineal area, Buttocks, Right upper leg, Left upper leg, Right lower leg, Left lower leg     Bathing assist Assist Level: Total Assistance - Patient < 25%     Upper Body Dressing/Undressing Upper body dressing   What is the patient wearing?: Hospital gown only    Upper body assist Assist Level: Moderate Assistance - Patient 50 - 74%    Lower Body Dressing/Undressing Lower body  dressing      What is the patient wearing?: Hospital gown only, Incontinence brief     Lower body assist Assist for lower body dressing: Dependent - Patient 0%     Toileting Toileting    Toileting assist Assist for toileting: Dependent - Patient 0%     Transfers Chair/bed transfer  Transfers assist     Chair/bed transfer assist level: Minimal Assistance - Patient > 75% (slide board B LE NWB +2 min A)     Locomotion Ambulation   Ambulation assist   Ambulation activity did not occur: Safety/medical concerns          Walk 10 feet activity   Assist  Walk 10 feet activity did not occur: Safety/medical concerns        Walk 50  feet activity   Assist Walk 50 feet with 2 turns activity did not occur: Safety/medical concerns         Walk 150 feet activity   Assist Walk 150 feet activity did not occur: Safety/medical concerns         Walk 10 feet on uneven surface  activity   Assist Walk 10 feet on uneven surfaces activity did not occur: Safety/medical concerns         Wheelchair     Assist Is the patient using a wheelchair?: Yes Type of Wheelchair: Manual    Wheelchair assist level: Supervision/Verbal cueing Max wheelchair distance: 150    Wheelchair 50 feet with 2 turns activity    Assist        Assist Level: Supervision/Verbal cueing   Wheelchair 150 feet activity     Assist      Assist Level: Supervision/Verbal cueing   Blood pressure 139/62, pulse 65, temperature 97.7 F (36.5 C), temperature source Oral, resp. rate 18, height 5\' 10"  (1.778 m), weight 108.4 kg, SpO2 95%.  Medical Problem List and Plan: 1. Functional deficits secondary to polytrauma             -patient may shower             -ELOS/Goals: 14-20 days S with PT/OT             -Continue CIR -SLP consult for cognition-felt to be at baseline, slums score of 21 out of 30 -Team conference today please see physician documentation under team conference tab, met with team  to discuss problems,progress, and goals. Formulized individual treatment plan based on medical history, underlying problem and comorbidities.  --Dr. Jena Gauss rec NWB, slide board transfers, only 1 session of therapy a day.  Recommended consideration of SNF, patient is not interested SNF.  He has several family members coming to help -QD therapy  -Estimated discharge 11/11-patient working with SLP on dysphagia   2.  Antithrombotics: -DVT/anticoagulation:  Pharmaceutical: Lovenox 30mg  BID, Eliquis 2.5 mg twice daily for 30 days on discharge for DVT prophylaxis             -antiplatelet therapy: N/A 3. Pain Management: continue tylenol  650mg  q6h, robaxin 1000mg  q8h, Oxycodone as needed severe pain -10/25 Will schedule oxycodone 5 mg at 7 AM, gabapentin 100 mg 3 times daily started -05/23/23 mildly sore throat, cepacol ordered -Gabapentin increased to 300 mg 3 times daily 11/4, Xray Pelvix ordered, Robaxin dose was decreased to 750 mg -05/26/23 continue current pain regimen  05/27/23 pain control today, continue to monitor and continue current pain management 11/7 pain controlled overall today, continue current regimen 11/8 discontinue scheduled oxycodone, continue oxycodone as needed  4. Mood/Behavior/Sleep: LCSW to follow for evaluation and support             -antipsychotic agents: Seroquel 50mg  as needed  -Lorazepam 0.5mg  QHS -05/16/23 sleeping fair but wants to try increased melatonin dose, inc to 5mg  at bedtime, monitor sleep for now-- improved 05/17/23! -05/18/2023 will increase Seroquel to 75 mg at bedtime -10/31 Increase seroquel to 100mg  HS  -Sleep-wake chart ordered -05/23/23 didn't sleep well but didn't get seroquel-- wrote it down for him to ask, discussed with nursing to also ask if he wants it tonight-- helped 05/24/23! -05/25/23 new order placed for sleep-wake chart -05/26/23 discussed sleep-wake monitoring with nursing, will increase lorazepam to 1 mg. Discussed options with pharmacy appreciate assistance -11/6 appears he is sleeping most of the night, will adjust schedule of his insomnia medications to start a little earlier.  Possibly memory deficits contributing to his feeling that he is having a hard time sleeping.  11/7 sleep chart indicates he has been sleeping okay overall, continue current regimen for now  11/8 patient continues to complain of insomnia even on days when sleep chart indicated he was sleeping 8 hours, will asked nursing to recheck sleep chart tonight -05/30/23 sleep chart indicates more sleep, but pt states he's not sleeping well; will increase melatonin to 10mg  tonight to see if this helps.  5.  Neuropsych/cognition: This patient is  capable of making decisions on his own behalf. 6. Skin/Wound Care: Routine pressure-relief measures.   --Monitor right elbow for drainage/healing. 7. Fluids/Electrolytes/Nutrition: Monitor I/os.  Continue Ensure supplements.             -- Add vitamin C/D3, MVI, zinc and Juven. 8. Pelvic ring fracture s/p percutaneous fixation: Strict NWB RLE and WBAT LLE for transfers only.  -- hardware loosening noted, NWB b/l LE, slide board transfers  -Air mattress for wound prevention  -11/5 x-ray Pelvis yesterday stable  11/7 with BX reviewed images 11/4.  Continue nonweightbearing bilateral lower extremities, okay for slide board transfers per Ortho.  1 therapy session daily.  Plan for repeat x-rays in about 2 weeks outpatient with Ortho. 9. Right elbow wound drainage: Now on doxycycline.  Reports night sweats.  Cultures sent -Discussed with Dr. Jena Gauss yesterday who recommended Doxycycline 100 mg twice daily, his team plans to follow-up and check on him and CIR.   -10/25 wound VAC started by Ortho appreciate assistance, continue current antibiotics, will recheck labs tomorrow -05/16/23 BCx and elbow Cx NGTD x3 days, WBC down to 10.4 today; monitor-- still NGTD 05/17/23 -10/28 WBC 12.0 today, continue doxycycline and wound VAC -10/30 wound vac removed by ortho -10/31 wound with small amount of draining, continue to monitor and continue abx, WBC down to 10.9 -11/1 orthopedics contacted regarding wound, continue antibiotics and monitor for now  11/6 consider discontinuation of antibiotics  11/7 will contact orthopedics regarding continuation of antibiotics- ok to discontinue and remove sutures -05/30/23 apparently sutures removed yesterday, nursing reports ortho was supposed to come by but no note written; still some purulent drainage expressible from medial aspect, some slough as well; contacted Montez Morita PA-C and Myrene Galas MD from ortho, Thyra Breed PA-C also  responding since she saw pt yesterday; will restart Doxy x7 days, she will also come by to see him Monday before he leaves.  10. Acute blood loss anemia: Monitor for any signs of bleeding.  Hgb stable 10.1 on 05/21/23  11. Leukocytosis: White count trending down from 23.6-->12.9>> 10.1 10/26; monitor routinely  -11/7 WBC a  little higher today at 11.1 12.  Thrombocytosis: Likely reactive.  Continue to monitor.  446 on 10/28 13. Low protein stores: Albumin down to 2.2--supplements added. 14. H/o NICM/HF s/p ICD:  Daily weights/monitor for signs of overload. Change diet to heart healthy.  No signs of fluid overload -- On Coreg 25mg  BID, aldactone 25mg  QD, Lasix 20mg  BID, and Pravachol 80mg  daily, Zetia 10mg  daily             --decrease Kdur to 20 meq bid as K trending up.  --Repeat chest x-ray in a.m.- no acute findings -10/29 recheck chest x-ray today- no acute changes -11/1 patient denies any shortness of breath, reports cough and congestion is better today -11/7 BP stable overall, continue to monitor.  Patient was started on O2 nasal cannula late this morning.  Patient denies shortness of breath.  O2 nasal cannula has been removed since this time and appears sats have been okay chest x-ray without acute findings -11/8 stable on RA Filed Weights   05/28/23 0446 05/29/23 0505 05/30/23 0500  Weight: 106.1 kg 106.6 kg 108.4 kg      05/30/2023    5:19 AM 05/30/2023    5:00 AM 05/29/2023    7:27 PM  Vitals with BMI  Weight  239 lbs   BMI  34.29   Systolic 139  114  Diastolic 62  60  Pulse 65  65     15. Multiple rib fractures: Encourage pulmonary hygiene. Incentive spirometer ordered  -10/29 patient bringing up more mucus today, may be due to the Mucinex   16. Urinary retention: Monitor voiding with PVR checks.  continue Flomax 0.8mg  daily.  -10/24 Required IC this AM, continue to monitor  PVRs  -Timed voids every 4 hours, consider UA if urinary symptoms worsen -05/16/23 PVRs  decreasing, hasn't needed cath since yesterday; monitor -05/17/23 needed cath last night for and in the afternoon yesterday; defer to weekday team regarding management of this -10/31 intermittently incontinent of bladder, suspect limited mobility is contributing.  Elevated bladder scan noted by nursing when I came in the room.  He denied sensation of needing to void.  We discussed offering him a urinal to try to void.  Appears he had a PVR later in the morning with 9 mL.  Timed voids ordered. -11/5 has not required IC in couple days, continue to monitor -11/6 discontinued PVRs   17. Constipation. Appears to be having regular Bms.   -Cont miralax prn -05/17/23 last BM this morning but loose, no stool softeners being given, likely from doxycycline, monitor for ongoing diarrhea -11/1 LBM yesterday but small, will start MiraLAX daily, sorbitol as needed ordered -05/23/23 LBM yesterday but pt reports no good one in a couple days, will increase miralax BID for now.  -05/24/23 LBM this morning, cont regimen -05/30/23 LBM yesterday, cont regimen  18.  Dysphagia.  SLP consult placed.  Diet decreased to D3 for now -Unclear cause of dysphagia, may be more chronic issue.  Continue G-tube thin diet.  Had MBS today.  Continue SLP.  -Continue D2 thin diet for now  LOS: 17 days A FACE TO FACE EVALUATION WAS PERFORMED  [address] 05/30/2023, 8:02 AM

## 2023-05-30 NOTE — Progress Notes (Signed)
Was contacted by CIR PA [facility]) regarding drainage from the right elbow.  Notes minimal drainage on the bandage.  No active serosanguineous or purulent drainage but notes she was able to express a small amount of purulent type fluid from the medial elbow this morning.  Notes redness around the elbow as well.  As patient is nearing towards discharge home on Monday, 06/01/2023, feels appropriate to resume doxycycline 100 mg twice daily for an additional 7 days.  Would increase dressing changes to daily (4x4 gauze, kerlix, ace wrap).   I will plan to reevaluate patient on Monday morning (06/01/2023) prior to discharge.  Would recommend continuing the doxycycline for a total of 7 days (14 doses).  Prescription has been sent to [facility] pharmacy for patient will continue this at discharge.  Planned outpatient follow-up with Dr. Jena Gauss on Tuesday, 06/09/2023 at 10:45 AM for wound check right elbow and repeat x-rays of the pelvis.  Thompson Caul PA-C [facility] [phone] (office) [email or web address]

## 2023-05-30 NOTE — Plan of Care (Signed)
  Problem: RH Swallowing Goal: LTG Patient will consume least restrictive diet using compensatory strategies with assistance (SLP) Description: LTG:  Patient will consume least restrictive diet using compensatory strategies with assistance (SLP) Outcome: Completed/Met   

## 2023-05-30 NOTE — Progress Notes (Signed)
Inpatient Rehabilitation Discharge Medication Review by a Pharmacist  A complete drug regimen review was completed for this patient to identify any potential clinically significant medication issues.  High Risk Drug Classes Is patient taking? Indication by Medication  Antipsychotic Yes Quetiapine - agitation  Anticoagulant Yes Apixaban - DVT px  Antibiotic Yes Doxycycline - wound infection  Opioid Yes Oxycodone - PRN pain  Antiplatelet No   Hypoglycemics/insulin No   Vasoactive Medication Yes, as an intravenous medication Carvedilol- HTN Furosemide, spironolactone - CHF  Chemotherapy No   Other Yes Vit D, Kcl, MVI, vit C, zinc - supplement Albuterol - PRN SOB Ezetimibe, pravastatin - HLD Tamsulosin - urinary retention Lorazepam, melatonin - sleep Methocarbamol - muscle spasms Levocetirizine - allergic rhinitis Pantoprazole - GERD ppx Neurontin - neuropathy Zoloft - MDD     Type of Medication Issue Identified Description of Issue Recommendation(s)  Drug Interaction(s) (clinically significant)     Duplicate Therapy     Allergy     No Medication Administration End Date     Incorrect Dose     Additional Drug Therapy Needed     Significant med changes from prior encounter (inform family/care partners about these prior to discharge).    Other       Clinically significant medication issues were identified that warrant physician communication and completion of prescribed/recommended actions by midnight of the next day:  No   Pharmacist comments: n/a   Time spent performing this drug regimen review (minutes): 20   Ulyses Southward, PharmD, [place], AAHIVP, CPP Infectious Disease Pharmacist 05/30/2023 2:30 PM

## 2023-05-31 NOTE — Plan of Care (Signed)
  Problem: RH Grooming Goal: LTG Patient will perform grooming w/assist,cues/equip (OT) Description: LTG: Patient will perform grooming with assist, with/without cues using equipment (OT) Outcome: Completed/Met   Problem: RH Bathing Goal: LTG Patient will bathe all body parts with assist levels (OT) Description: LTG: Patient will bathe all body parts with assist levels (OT) Outcome: Adequate for Discharge Note: Pt requires Mod A for maintenance of pelvic integrity and pain management.    Problem: RH Dressing Goal: LTG Patient will perform upper body dressing (OT) Description: LTG Patient will perform upper body dressing with assist, with/without cues (OT). Outcome: Completed/Met Goal: LTG Patient will perform lower body dressing w/assist (OT) Description: LTG: Patient will perform lower body dressing with assist, with/without cues in positioning using equipment (OT) Outcome: Completed/Met   Problem: RH Toileting Goal: LTG Patient will perform toileting task (3/3 steps) with assistance level (OT) Description: LTG: Patient will perform toileting task (3/3 steps) with assistance level (OT)  Outcome: Adequate for Discharge Note: Pt continues to require Max A for toileting tasks at [facility] for WB precaution maintance, LB clothing management, and pericare.    Problem: RH Toilet Transfers Goal: LTG Patient will perform toilet transfers w/assist (OT) Description: LTG: Patient will perform toilet transfers with assist, with/without cues using equipment (OT) Outcome: Completed/Met   Problem: RH Tub/Shower Transfers Goal: LTG Patient will perform tub/shower transfers w/assist (OT) Description: LTG: Patient will perform tub/shower transfers with assist, with/without cues using equipment (OT) Outcome: Completed/Met

## 2023-05-31 NOTE — Progress Notes (Addendum)
PROGRESS NOTE   Subjective/Complaints:  Pt doing much better this morning, slept much better. Pain well controlled. LBM overnight. Urinating fine.  Otherwise no other concerns or complaints.    ROS: Patient denies fever, rash, sore throat, blurred vision, dizziness, nausea, vomiting, diarrhea, constipation, cough, shortness of breath  headache, or mood change.  + Right pelvis area pain/ R lower leg pain-improved + Insomnia-patient continues to report + Chest congestion and cough-improved  + Dysphagia-reports a little improved today  Objective:   No results found. No results for input(s): "WBC", "HGB", "HCT", "PLT" in the last 72 hours.   No results for input(s): "NA", "K", "CL", "CO2", "GLUCOSE", "BUN", "CREATININE", "CALCIUM" in the last 72 hours.    Intake/Output Summary (Last 24 hours) at 05/31/2023 0757 Last data filed at 05/31/2023 4696 Gross per 24 hour  Intake 220 ml  Output 1150 ml  Net -930 ml        Physical Exam: Vital Signs Blood pressure (!) 142/59, pulse 66, temperature 97.8 F (36.6 C), temperature source Oral, resp. rate 18, height 5\' 10"  (1.778 m), weight 106.5 kg, SpO2 93%.   General: No apparent distress, reclined in bed eating breakfast.  HEENT: MMM, EOMI Neck: Supple without JVD or lymphadenopathy Heart: Reg rate and rhythm. No m/r/g appreciated.  Chest: CTAB, no increased WOB, on room air today, no w/r/r appreciated. Abdomen: Soft, non-tender, non-distended, bowel sounds positive. Extremities: No clubbing, cyanosis, or edema. Pulses are 2+. R elbow wound now steristripped at medial aspect, some slough noted but small amount of purulence expressible from medial area, nontender, mildly erythematous and warm to touch--not reassessed today Psych: Pt is cooperative.  Patient is anxious appearing still  PRIOR EXAM: Musculoskeletal:  Comments: Right elbow wound with dry dressing in  place Prepubic and right lower quadrant incisions C/D/I  Neurological:     Mental Status: He is oriented to person, place, and time. Follows commands. + memory deficits       Assessment/Plan: 1. Functional deficits which require 3+ hours per day of interdisciplinary therapy in a comprehensive inpatient rehab setting. Physiatrist is providing close team supervision and 24 hour management of active medical problems listed below. Physiatrist and rehab team continue to assess barriers to discharge/monitor patient progress toward functional and medical goals  Care Tool:  Bathing    Body parts bathed by patient: Right arm, Left arm, Chest, Abdomen, Face   Body parts bathed by helper: Front perineal area, Buttocks, Right upper leg, Left upper leg, Right lower leg, Left lower leg     Bathing assist Assist Level: Total Assistance - Patient < 25%     Upper Body Dressing/Undressing Upper body dressing   What is the patient wearing?: Hospital gown only    Upper body assist Assist Level: Moderate Assistance - Patient 50 - 74%    Lower Body Dressing/Undressing Lower body dressing      What is the patient wearing?: Hospital gown only, Incontinence brief     Lower body assist Assist for lower body dressing: Dependent - Patient 0%     Toileting Toileting    Toileting assist Assist for toileting: Dependent - Patient 0%     Transfers  Chair/bed transfer  Transfers assist     Chair/bed transfer assist level: Minimal Assistance - Patient > 75% (slide board B LE NWB +2 min A)     Locomotion Ambulation   Ambulation assist   Ambulation activity did not occur: Safety/medical concerns          Walk 10 feet activity   Assist  Walk 10 feet activity did not occur: Safety/medical concerns        Walk 50 feet activity   Assist Walk 50 feet with 2 turns activity did not occur: Safety/medical concerns         Walk 150 feet activity   Assist Walk 150 feet  activity did not occur: Safety/medical concerns         Walk 10 feet on uneven surface  activity   Assist Walk 10 feet on uneven surfaces activity did not occur: Safety/medical concerns         Wheelchair     Assist Is the patient using a wheelchair?: Yes Type of Wheelchair: Manual    Wheelchair assist level: Supervision/Verbal cueing Max wheelchair distance: 150    Wheelchair 50 feet with 2 turns activity    Assist        Assist Level: Supervision/Verbal cueing   Wheelchair 150 feet activity     Assist      Assist Level: Supervision/Verbal cueing   Blood pressure (!) 142/59, pulse 66, temperature 97.8 F (36.6 C), temperature source Oral, resp. rate 18, height 5\' 10"  (1.778 m), weight 106.5 kg, SpO2 93%.  Medical Problem List and Plan: 1. Functional deficits secondary to polytrauma             -patient may shower             -ELOS/Goals: 14-20 days S with PT/OT             -Continue CIR -SLP consult for cognition-felt to be at baseline, slums score of 21 out of 30 -Team conference today please see physician documentation under team conference tab, met with team  to discuss problems,progress, and goals. Formulized individual treatment plan based on medical history, underlying problem and comorbidities.  --Dr. Jena Gauss rec NWB, slide board transfers, only 1 session of therapy a day.  Recommended consideration of SNF, patient is not interested SNF.  He has several family members coming to help -QD therapy  -Estimated discharge 11/11-patient working with SLP on dysphagia   2.  Antithrombotics: -DVT/anticoagulation:  Pharmaceutical: Lovenox 30mg  BID, Eliquis 2.5 mg twice daily for 30 days on discharge for DVT prophylaxis             -antiplatelet therapy: N/A 3. Pain Management: continue tylenol 650mg  q6h, robaxin 1000mg  q8h, Oxycodone as needed severe pain -10/25 Will schedule oxycodone 5 mg at 7 AM, gabapentin 100 mg 3 times daily started -05/23/23  mildly sore throat, cepacol ordered -Gabapentin increased to 300 mg 3 times daily 11/4, Xray Pelvix ordered, Robaxin dose was decreased to 750 mg -05/26/23 continue current pain regimen  05/27/23 pain control today, continue to monitor and continue current pain management 11/7 pain controlled overall today, continue current regimen 11/8 discontinue scheduled oxycodone, continue oxycodone as needed 4. Mood/Behavior/Sleep: LCSW to follow for evaluation and support             -antipsychotic agents: Seroquel 50mg  as needed  -Lorazepam 0.5mg  QHS -05/16/23 sleeping fair but wants to try increased melatonin dose, inc to 5mg  at bedtime, monitor sleep for now-- improved 05/17/23! -05/18/2023 will  increase Seroquel to 75 mg at bedtime -10/31 Increase seroquel to 100mg  HS  -Sleep-wake chart ordered -05/23/23 didn't sleep well but didn't get seroquel-- wrote it down for him to ask, discussed with nursing to also ask if he wants it tonight-- helped 05/24/23! -05/25/23 new order placed for sleep-wake chart -05/26/23 discussed sleep-wake monitoring with nursing, will increase lorazepam to 1 mg. Discussed options with pharmacy appreciate assistance -11/6 appears he is sleeping most of the night, will adjust schedule of his insomnia medications to start a little earlier.  Possibly memory deficits contributing to his feeling that he is having a hard time sleeping.  11/7 sleep chart indicates he has been sleeping okay overall, continue current regimen for now  11/8 patient continues to complain of insomnia even on days when sleep chart indicated he was sleeping 8 hours, will asked nursing to recheck sleep chart tonight -05/30/23 sleep chart indicates more sleep, but pt states he's not sleeping well; will increase melatonin to 10mg  tonight to see if this helps--improved 11/10 5. Neuropsych/cognition: This patient is  capable of making decisions on his own behalf. 6. Skin/Wound Care: Routine pressure-relief measures.    --Monitor right elbow for drainage/healing. 7. Fluids/Electrolytes/Nutrition: Monitor I/os.  Continue Ensure supplements.             -- Add vitamin C/D3, MVI, zinc and Juven. 8. Pelvic ring fracture s/p percutaneous fixation: Strict NWB RLE and WBAT LLE for transfers only.  -- hardware loosening noted, NWB b/l LE, slide board transfers  -Air mattress for wound prevention  -11/5 x-ray Pelvis yesterday stable  11/7 with BX reviewed images 11/4.  Continue nonweightbearing bilateral lower extremities, okay for slide board transfers per Ortho.  1 therapy session daily.  Plan for repeat x-rays in about 2 weeks outpatient with Ortho. 9. Right elbow wound drainage: Now on doxycycline.  Reports night sweats.  Cultures sent -Discussed with Dr. Jena Gauss yesterday who recommended Doxycycline 100 mg twice daily, his team plans to follow-up and check on him and CIR.   -10/25 wound VAC started by Ortho appreciate assistance, continue current antibiotics, will recheck labs tomorrow -05/16/23 BCx and elbow Cx NGTD x3 days, WBC down to 10.4 today; monitor-- still NGTD 05/17/23 -10/28 WBC 12.0 today, continue doxycycline and wound VAC -10/30 wound vac removed by ortho -10/31 wound with small amount of draining, continue to monitor and continue abx, WBC down to 10.9 -11/1 orthopedics contacted regarding wound, continue antibiotics and monitor for now  11/6 consider discontinuation of antibiotics  11/7 will contact orthopedics regarding continuation of antibiotics- ok to discontinue and remove sutures -05/30/23 apparently sutures removed yesterday, nursing reports ortho was supposed to come by but no note written; still some purulent drainage expressible from medial aspect, some slough as well; contacted Montez Morita PA-C and Myrene Galas MD from ortho, Thyra Breed PA-C also responding since she saw pt yesterday; will restart Doxy x7 days, she will also come by to see him Monday before he leaves. Appreciate their  assistance, see notes filed for full info 10. Acute blood loss anemia: Monitor for any signs of bleeding.  Hgb stable 10.1 on 05/21/23  11. Leukocytosis: White count trending down from 23.6-->12.9>> 10.1 10/26; monitor routinely  -11/7 WBC a little higher today at 11.1 12.  Thrombocytosis: Likely reactive.  Continue to monitor.  446 on 10/28 13. Low protein stores: Albumin down to 2.2--supplements added. 14. H/o NICM/HF s/p ICD:  Daily weights/monitor for signs of overload. Change diet to heart healthy.  No  signs of fluid overload -- On Coreg 25mg  BID, aldactone 25mg  QD, Lasix 20mg  BID, and Pravachol 80mg  daily, Zetia 10mg  daily             --decrease Kdur to 20 meq bid as K trending up.  --Repeat chest x-ray in a.m.- no acute findings -10/29 recheck chest x-ray today- no acute changes -11/1 patient denies any shortness of breath, reports cough and congestion is better today -11/7 BP stable overall, continue to monitor.  Patient was started on O2 nasal cannula late this morning.  Patient denies shortness of breath.  O2 nasal cannula has been removed since this time and appears sats have been okay chest x-ray without acute findings -11/8 stable on RA -05/31/23 wt stable, BPs stable, cont regimen Filed Weights   05/29/23 0505 05/30/23 0500 05/31/23 0454  Weight: 106.6 kg 108.4 kg 106.5 kg      05/31/2023    5:36 AM 05/31/2023    4:54 AM 05/30/2023    6:18 PM  Vitals with BMI  Weight  234 lbs 13 oz   BMI  33.69   Systolic 142  119  Diastolic 59  35  Pulse 66  69     15. Multiple rib fractures: Encourage pulmonary hygiene. Incentive spirometer ordered  -10/29 patient bringing up more mucus today, may be due to the Mucinex   16. Urinary retention: Monitor voiding with PVR checks.  continue Flomax 0.8mg  daily.  -10/24 Required IC this AM, continue to monitor  PVRs  -Timed voids every 4 hours, consider UA if urinary symptoms worsen -05/16/23 PVRs decreasing, hasn't needed cath since  yesterday; monitor -05/17/23 needed cath last night for and in the afternoon yesterday; defer to weekday team regarding management of this -10/31 intermittently incontinent of bladder, suspect limited mobility is contributing.  Elevated bladder scan noted by nursing when I came in the room.  He denied sensation of needing to void.  We discussed offering him a urinal to try to void.  Appears he had a PVR later in the morning with 9 mL.  Timed voids ordered. -11/5 has not required IC in couple days, continue to monitor -11/6 discontinued PVRs   17. Constipation. Appears to be having regular Bms.   -Cont miralax prn -05/17/23 last BM this morning but loose, no stool softeners being given, likely from doxycycline, monitor for ongoing diarrhea -11/1 LBM yesterday but small, will start MiraLAX daily, sorbitol as needed ordered -05/23/23 LBM yesterday but pt reports no good one in a couple days, will increase miralax BID for now.  -05/31/23 LBM overnight, cont regimen  18.  Dysphagia.  SLP consult placed.  Diet decreased to D3 for now -Unclear cause of dysphagia, may be more chronic issue.  Continue G-tube thin diet.  Had MBS today.  Continue SLP.  -Continue D2 thin diet for now  LOS: 18 days A FACE TO FACE EVALUATION WAS PERFORMED  [address] 05/31/2023, 7:57 AM

## 2023-05-31 NOTE — Progress Notes (Signed)
Sleep chart completed. Patient slept about 8 hours last night. No complaints noted. Patient using urinal but spills occasionally. LBM 11/10 incontinent. Peri care completed.

## 2023-05-31 NOTE — Progress Notes (Signed)
Occupational Therapy Discharge Summary  Patient Details  Name: Louis Juarez. MRN: [id number] Date of Birth: 1941-04-04  Date of Discharge from OT service:May 31, 2023  Today's Date: 05/31/2023 OT Individual Time: 1105-1202 OT Individual Time Calculation (min): 57 min    Patient has met 5 of 7 long term goals due to improved activity tolerance, improved balance, ability to compensate for deficits, improved awareness, and improved pain management .  Patient to discharge at overall Min A for slideboard transfers, requiring A for caregiver to elevate BLE for precaution maintance, Mod I for UB care at sink-side, and Mod-Max A for LB care including toileting at bed-level and on BSC. Patient's care partner is independent to provide the necessary physical and cognitive assistance at discharge.    Reasons goals not met: Pt requires increased levels of assistance for LB care to maintain pelvic structure integrity and manage pain.   Recommendation:  Patient will benefit from ongoing skilled OT services in home health setting to continue to advance functional skills in the area of BADL and Reduce care partner burden.  Equipment: TTB & Bariatric drop-arm BSC  Reasons for discharge: treatment goals met and discharge from hospital  Patient/family agrees with progress made and goals achieved: Yes  OT Discharge Precautions/Restrictions  Precautions Precautions: Fall Restrictions Weight Bearing Restrictions: Yes RLE Weight Bearing: Non weight bearing LLE Weight Bearing: Non weight bearing Other Position/Activity Restrictions: NWB BLE Pain Pain Assessment Pain Scale: 0-10 Pain Score: 5  Pain Type: Acute pain Pain Location: Hip (& Leg) Pain Orientation: Right Pain Intervention(s): Medication (See eMAR);Rest ADL ADL Equipment Provided: Long-handled sponge Eating: Modified independent Where Assessed-Eating: Wheelchair Grooming: Modified independent Where Assessed-Grooming:  Sitting at sink, Wheelchair Upper Body Bathing: Modified independent Where Assessed-Upper Body Bathing: Wheelchair, Sitting at sink Lower Body Bathing: Moderate assistance Where Assessed-Lower Body Bathing: Bed level Upper Body Dressing: Modified independent (Device) Where Assessed-Upper Body Dressing: Sitting at sink Lower Body Dressing: Minimal assistance Where Assessed-Lower Body Dressing: Bed level Toileting: Maximal assistance Where Assessed-Toileting: Bedside Commode Toilet Transfer: Minimal assistance Toilet Transfer Method: Scientist, research (life sciences): Extra wide drop arm bedside commode Tub/Shower Transfer Method: Unable to assess Psychologist, counselling Transfer: Minimal assistance Film/video editor Method: Best boy: Transfer tub bench ADL Comments: Pt in significant pain in back throughout session. Pain meds given prior but needed heavier pain meds not due until 11 am ths provided repositioning, weight shifts during self care and warm cloths on back and LE's elevated with pillow behind back. Sink side self care w/c level only conducted this session due to pain, time constraints and WB status. Vision Baseline Vision/History: 1 Wears glasses Patient Visual Report: No change from baseline Vision Assessment?: No apparent visual deficits Perception  Perception: Within Functional Limits Praxis Praxis: WFL Cognition Cognition Overall Cognitive Status: History of cognitive impairments - at baseline Arousal/Alertness: Awake/alert Orientation Level: Person;Place;Situation Memory: Impaired Memory Impairment: Retrieval deficit;Storage deficit;Decreased short term memory Awareness: Impaired Problem Solving: Impaired Safety/Judgment: Impaired-patient unable to implement BLE NWB precautions. Brief Interview for Mental Status (BIMS) Repetition of Three Words (First Attempt): 3 Temporal Orientation: Year: Correct Temporal Orientation: Month:  Accurate within 5 days Temporal Orientation: Day: Correct Recall: "Sock": No, could not recall Recall: "Blue": Yes, no cue required Recall: "Bed": No, could not recall BIMS Summary Score: 11 Sensation Sensation Light Touch: Appears Intact Hot/Cold: Appears Intact Proprioception: Appears Intact Stereognosis: Appears Intact Coordination Gross Motor Movements are Fluid and Coordinated: No Fine Motor Movements are Fluid and  Coordinated: Yes Coordination and Movement Description: Deficits due to NWB BLE and increased pain from fracture sites. Motor  Motor Motor: Within Functional Limits Mobility  Slide-board-Min A Trunk/Postural Assessment  Cervical Assessment Cervical Assessment: Exceptions to [facility] (Forward head) Thoracic Assessment Thoracic Assessment: Exceptions to [facility] (Rounded shoulders) Lumbar Assessment Lumbar Assessment: Exceptions to [facility] (Posterior pelvic tilt) Postural Control Postural Control: Within Functional Limits  Balance Balance Balance Assessed: Yes Static Sitting Balance Static Sitting - Balance Support: Bilateral upper extremity supported Static Sitting - Level of Assistance: 5: Stand by assistance Dynamic Sitting Balance Dynamic Sitting - Balance Support: During functional activity Dynamic Sitting - Level of Assistance: 5: Stand by assistance Dynamic Sitting - Balance Activities: Lateral lean/weight shifting;Forward lean/weight shifting Extremity/Trunk Assessment RUE Assessment RUE Assessment: Within Functional Limits LUE Assessment LUE Assessment: Within Functional Limits  Skilled Intervention: Pt greeted resting in bed for skilled OT session with focus on BADL participation.   Pain: Pt reported 6/10 pain,details above. OT offering intermediate rest breaks and positioning suggestions throughout session to address pain/fatigue and maximize participation/safety in session.   Functional Transfers: Pt performs bed mobility (rolling) with supervision + use  of bed features, supine>sit in similar fashion. Pt requires A to place slide board and elevate BLE for precaution adherence, overall Min A for EOB>WC.   Self Care Tasks: Pt completes LB dressing with assistance for threading BLEs, rolling to don over bottom/hips. At sink-side, bathes UB, changes top, brushes teeth/hair, all with Mod I.   Therapeutic Activities: Pt dependently transported to outdoor area for improved emotional state and re-orientation to time/place.   Pt remained sitting in WC with 4Ps assessed and immediate needs met. Pt continues to be appropriate for skilled OT intervention to promote further functional independence in ADLs/IADLs.   [name], OTR/L, MSOT  05/31/2023, 12:26 PM

## 2023-05-31 NOTE — Plan of Care (Signed)
  Problem: RH Balance Goal: LTG Patient will maintain dynamic sitting balance (PT) Description: LTG:  Patient will maintain dynamic sitting balance with assistance during mobility activities (PT) Outcome: Completed/Met   Problem: RH Bed Mobility Goal: LTG Patient will perform bed mobility with assist (PT) Description: LTG: Patient will perform bed mobility with assistance, with/without cues (PT). Outcome: Completed/Met   Problem: RH Bed to Chair Transfers Goal: LTG Patient will perform bed/chair transfers w/assist (PT) Description: LTG: Patient will perform bed to chair transfers with assistance (PT). Outcome: Completed/Met   Problem: RH Wheelchair Mobility Goal: LTG Patient will propel w/c in controlled environment (PT) Description: LTG: Patient will propel wheelchair in controlled environment, # of feet with assist (PT) Outcome: Completed/Met Goal: LTG Patient will propel w/c in home environment (PT) Description: LTG: Patient will propel wheelchair in home environment, # of feet with assistance (PT). Outcome: Completed/Met

## 2023-05-31 NOTE — Progress Notes (Signed)
Physical Therapy Discharge Summary  Patient Details  Name: Louis Juarez. MRN: [id number] Date of Birth: Jul 16, 1941  Date of Discharge from PT service:May 31, 2023   Today's Date: 05/31/2023 PT Individual Time: 1300-1347 PT Individual Time Calculation (min): 47 min  and Today's Date: 05/31/2023 PT Missed Time: 13 Minutes Missed Time Reason: Patient fatigue   Patient has met 5 of 5 long term goals due to improved activity tolerance, improved balance, increased strength, increased range of motion, decreased pain, ability to compensate for deficits, improved attention, improved awareness, and improved coordination.  Patient to discharge at a wheelchair level with Min Assist for slide board transfer for maintenance of B LE NWBing precautions.  Patient's care partner is independent to provide the necessary physical assistance at discharge. Family training completed with pt wife and son on 11/7. Pt discharging with nonemergency ambulance transport for assistance up/down stairs   Recommendation:  Patient will benefit from ongoing skilled PT services in home health setting to continue to advance safe functional mobility, address ongoing impairments in strength, ROM, balance, gait (once restrictions are lifted), and minimize fall risk.  Equipment: WC, hospital bed slide board, transfer tub bench, BSC  Reasons for discharge: treatment goals met and discharge from hospital  Patient/family agrees with progress made and goals achieved: Yes  PT Discharge Precautions/Restrictions Precautions Precautions: Fall Restrictions Weight Bearing Restrictions: Yes RUE Weight Bearing: Non weight bearing RLE Weight Bearing: Non weight bearing LLE Weight Bearing: Non weight bearing Other Position/Activity Restrictions: NWB BLE Pain Interference Pain Interference Pain Effect on Sleep: 3. Frequently Pain Interference with Therapy Activities: 3. Frequently Pain Interference with Day-to-Day  Activities: 3. Frequently Vision/Perception  Vision - History Ability to See in Adequate Light: 0 Adequate Perception Perception: Within Functional Limits Praxis Praxis: WFL  Cognition Overall Cognitive Status: History of cognitive impairments - at baseline Arousal/Alertness: Awake/alert Orientation Level: Oriented X4 Memory: Impaired Memory Impairment: Retrieval deficit;Storage deficit;Decreased short term memory Awareness: Impaired Problem Solving: Impaired Safety/Judgment: Impaired Sensation Sensation Light Touch: Appears Intact Hot/Cold: Appears Intact Proprioception: Appears Intact Stereognosis: Appears Intact Coordination Gross Motor Movements are Fluid and Coordinated: No Fine Motor Movements are Fluid and Coordinated: Yes Coordination and Movement Description: Deficits due to NWB BLE and increased pain from fracture sites. Motor  Motor Motor: Within Functional Limits  Mobility Bed Mobility Bed Mobility: Supine to Sit;Sit to Supine;Rolling Right;Rolling Left Rolling Right: Supervision/verbal cueing Rolling Left: Supervision/Verbal cueing Supine to Sit: Supervision/Verbal cueing Sit to Supine: Supervision/Verbal cueing Transfers Transfers: Lateral/Scoot Transfers Lateral/Scoot Transfers: Minimal Assistance - Patient > 75% Transfer (Assistive device):  (slide board) Locomotion  Gait Ambulation: No Gait Gait: No Stairs / Additional Locomotion Stairs: No Wheelchair Mobility Wheelchair Mobility: Yes Wheelchair Assistance: Doctor, general practice: Both upper extremities Wheelchair Parts Management: Needs assistance Distance: 150  Trunk/Postural Assessment  Cervical Assessment Cervical Assessment: Exceptions to [facility] (forward head) Thoracic Assessment Thoracic Assessment: Exceptions to [facility] (rounded shoulders) Lumbar Assessment Lumbar Assessment: Exceptions to [facility] (posterior pelvic tilt) Postural Control Postural Control: Within  Functional Limits Righting Reactions: delayed Protective Responses: delayed  Balance Balance Balance Assessed: Yes Static Sitting Balance Static Sitting - Balance Support: Bilateral upper extremity supported Static Sitting - Level of Assistance: 5: Stand by assistance Dynamic Sitting Balance Dynamic Sitting - Balance Support: During functional activity Dynamic Sitting - Level of Assistance: 5: Stand by assistance Dynamic Sitting - Balance Activities: Lateral lean/weight shifting;Forward lean/weight shifting Extremity Assessment  RUE Assessment RUE Assessment: Within Functional Limits LUE Assessment LUE Assessment: Within Functional Limits  RLE Assessment RLE Assessment: Exceptions to [facility] Strength Comments: grossly 2/5 LLE Assessment LLE Assessment: Exceptions to [facility] Strength Comments: grossly 3/5-resistance not applied 2/2 NWBing status   Today's Interventions   Pt supine in bed upon arrival with HOB elevated. Pt reports 7/10 pain R LE.Pt reports he just got back in bed. Pt performed discharge assessment.   Pt reviewed and performed the following HEP with verbal cues provided for completion within available range. Handout provided, therapist made notes for pt and wife to ensure pt completing within available range, and without compensation.    Access Code: [email or web address] URL: [email or web address] Date: 05/31/2023 Prepared by: [name]   Exercises - Active Straight Leg Raise with Quad Set  - 1 x daily - 7 x weekly - 3 sets - 10 reps - Supine Gluteal Sets  - 1 x daily - 7 x weekly - 3 sets - 10 reps - Supine Hip Abduction  - 1 x daily - 7 x weekly - 3 sets - 10 reps - Supine Heel Slide  - 1 x daily - 7 x weekly - 3 sets - 10 reps - Supine Quad Set  - 1 x daily - 7 x weekly - 3 sets - 10 reps - Supine Ankle Pumps  - 1 x daily - 7 x weekly - 3 sets - 10 reps - Seated Long Arc Quad  - 1 x daily - 7 x weekly - 3 sets - 10 reps - Seated March  - 1  x daily - 7 x weekly - 3 sets - 10 reps - Seated Gluteal Sets  - 1 x daily - 7 x weekly - 3 sets - 10 reps   [facility] [place], [place], DPT  05/31/2023, 1:23 PM

## 2023-06-01 ENCOUNTER — Other Ambulatory Visit (HOSPITAL_COMMUNITY): Payer: Self-pay

## 2023-06-01 DIAGNOSIS — S59909S Unspecified injury of unspecified elbow, sequela: Secondary | ICD-10-CM

## 2023-06-01 LAB — BASIC METABOLIC PANEL
Anion gap: 7 (ref 5–15)
BUN: 13 mg/dL (ref 8–23)
CO2: 29 mmol/L (ref 22–32)
Calcium: 9 mg/dL (ref 8.9–10.3)
Chloride: 98 mmol/L (ref 98–111)
Creatinine, Ser: 0.79 mg/dL (ref 0.61–1.24)
GFR, Estimated: >60 mL/min (ref >60)
Glucose, Bld: 131 mg/dL — ABNORMAL HIGH (ref 70–99)
Potassium: 3.8 mmol/L (ref 3.5–5.1)
Sodium: 134 mmol/L — ABNORMAL LOW (ref 135–145)

## 2023-06-01 LAB — CBC
HCT: 37.9 % — ABNORMAL LOW (ref 39.0–52.0)
Hemoglobin: 11.9 g/dL — ABNORMAL LOW (ref 13.0–17.0)
MCH: 31 pg (ref 26.0–34.0)
MCHC: 31.4 g/dL (ref 30.0–36.0)
MCV: 98.7 fL (ref 80.0–100.0)
Platelets: 285 10*3/uL (ref 150–400)
RBC: 3.84 MIL/uL — ABNORMAL LOW (ref 4.22–5.81)
RDW: 15.9 % — ABNORMAL HIGH (ref 11.5–15.5)
WBC: 9.6 10*3/uL (ref 4.0–10.5)
nRBC: 0 % (ref 0.0–0.2)

## 2023-06-01 MED ORDER — MELATONIN 5 MG PO TABS
10.0000 mg | ORAL_TABLET | Freq: Every day | ORAL | 0 refills | Status: DC
  Filled 2023-06-01: qty 60, 30d supply, fill #0

## 2023-06-01 MED ORDER — DOXYCYCLINE HYCLATE 100 MG PO TABS: 100.0000 mg | ORAL_TABLET | Freq: Two times a day (BID) | ORAL | Status: DC

## 2023-06-01 NOTE — Plan of Care (Signed)
Patient remains incontinent of bowel and bladder despite toileting, pain < 4 with scheduled and prn meds with loosened hardware.

## 2023-06-01 NOTE — Progress Notes (Signed)
PROGRESS NOTE   Subjective/Complaints:  Reports poor sleep last night.  He is looking forward to going home No additional concerns this morning   ROS: Patient denies fever, rash, sore throat, blurred vision, dizziness, nausea, vomiting, diarrhea, constipation, cough, shortness of breath  headache, or mood change.  Denies new motor or sensory changes.  + Right pelvis area pain/ R lower leg pain-overall controlled + Insomnia-patient continues to report + Chest congestion and cough-improved  + Dysphagia-reports a little improved today  Objective:   No results found. Recent Labs    06/01/23 0517  WBC 9.6  HGB 11.9*  HCT 37.9*  PLT 285     Recent Labs    06/01/23 0517  NA 134*  K 3.8  CL 98  CO2 29  GLUCOSE 131*  BUN 13  CREATININE 0.79  CALCIUM 9.0      Intake/Output Summary (Last 24 hours) at 06/01/2023 1006 Last data filed at 06/01/2023 0865 Gross per 24 hour  Intake 180 ml  Output 501 ml  Net -321 ml        Physical Exam: Vital Signs Blood pressure (!) 143/57, pulse 72, temperature 98 F (36.7 C), temperature source Oral, resp. rate 18, height 5\' 10"  (1.778 m), weight 106.3 kg, SpO2 91%.   General: No apparent distress, reclined in bed eating breakfast.  HEENT: MMM, EOMI Neck: Supple without JVD or lymphadenopathy Heart: Reg rate and rhythm. No m/r/g appreciated.  Chest: CTAB, no increased WOB, on room air today, no w/r/r appreciated. Abdomen: Soft, non-tender, non-distended, bowel sounds positive. Extremities: No clubbing, cyanosis, or edema. Pulses are 2+. R elbow wound now steristripped at medial aspect, small amount of drainage of gauze, non-tender Psych: Pt is cooperative.  Patient is anxious appearing still  PRIOR EXAM: Musculoskeletal:  Comments: Right elbow wound with dry dressing in place Prepubic and right lower quadrant incisions C/D/I  Neurological:     Mental Status: He is  oriented to person, place, and time. Follows commands. + memory deficits       Assessment/Plan: 1. Functional deficits which require 3+ hours per day of interdisciplinary therapy in a comprehensive inpatient rehab setting. Physiatrist is providing close team supervision and 24 hour management of active medical problems listed below. Physiatrist and rehab team continue to assess barriers to discharge/monitor patient progress toward functional and medical goals  Care Tool:  Bathing    Body parts bathed by patient: Right arm, Left arm, Chest, Abdomen, Face, Front perineal area, Right upper leg, Left upper leg   Body parts bathed by helper: Buttocks, Right lower leg, Left lower leg     Bathing assist Assist Level: Minimal Assistance - Patient > 75%     Upper Body Dressing/Undressing Upper body dressing   What is the patient wearing?: Pull over shirt    Upper body assist Assist Level: Independent with assistive device    Lower Body Dressing/Undressing Lower body dressing      What is the patient wearing?: Pants, Underwear/pull up (Shorts)     Lower body assist Assist for lower body dressing: Minimal Assistance - Patient > 75%     Editor, commissioning assist Assist for  toileting: 2 Helpers (Max A at bed-level; 2 helpers at [facility] level)     Transfers Chair/bed transfer  Transfers assist     Chair/bed transfer assist level: Minimal Assistance - Patient > 75%     Locomotion Ambulation   Ambulation assist   Ambulation activity did not occur: Safety/medical concerns          Walk 10 feet activity   Assist  Walk 10 feet activity did not occur: Safety/medical concerns        Walk 50 feet activity   Assist Walk 50 feet with 2 turns activity did not occur: Safety/medical concerns         Walk 150 feet activity   Assist Walk 150 feet activity did not occur: Safety/medical concerns         Walk 10 feet on uneven surface   activity   Assist Walk 10 feet on uneven surfaces activity did not occur: Safety/medical concerns         Wheelchair     Assist Is the patient using a wheelchair?: Yes Type of Wheelchair: Manual    Wheelchair assist level: Supervision/Verbal cueing Max wheelchair distance: 150    Wheelchair 50 feet with 2 turns activity    Assist        Assist Level: Supervision/Verbal cueing   Wheelchair 150 feet activity     Assist      Assist Level: Supervision/Verbal cueing   Blood pressure (!) 143/57, pulse 72, temperature 98 F (36.7 C), temperature source Oral, resp. rate 18, height 5\' 10"  (1.778 m), weight 106.3 kg, SpO2 91%.  Medical Problem List and Plan: 1. Functional deficits secondary to polytrauma             -patient may shower             -ELOS/Goals: 14-20 days S with PT/OT             -Continue CIR -SLP consult for cognition-felt to be at baseline, slums score of 21 out of 30 -Team conference today please see physician documentation under team conference tab, met with team  to discuss problems,progress, and goals. Formulized individual treatment plan based on medical history, underlying problem and comorbidities.  --Dr. Jena Gauss rec NWB, slide board transfers, only 1 session of therapy a day.  Recommended consideration of SNF, patient is not interested SNF.  He has several family members coming to help -QD therapy  -Estimated discharge 11/11-patient working with SLP on dysphagia  -OK for DC home today   2.  Antithrombotics: -DVT/anticoagulation:  Pharmaceutical: Lovenox 30mg  BID, Eliquis 2.5 mg twice daily for 30 days on discharge for DVT prophylaxis             -antiplatelet therapy: N/A 3. Pain Management: continue tylenol 650mg  q6h, robaxin 1000mg  q8h, Oxycodone as needed severe pain -10/25 Will schedule oxycodone 5 mg at 7 AM, gabapentin 100 mg 3 times daily started -05/23/23 mildly sore throat, cepacol ordered -Gabapentin increased to 300 mg 3  times daily 11/4, Xray Pelvix ordered, Robaxin dose was decreased to 750 mg -05/26/23 continue current pain regimen  05/27/23 pain control today, continue to monitor and continue current pain management 11/7 pain controlled overall today, continue current regimen 11/8 discontinue scheduled oxycodone, continue oxycodone as needed 11/11 pain controlled  4. Mood/Behavior/Sleep: LCSW to follow for evaluation and support             -antipsychotic agents: Seroquel 50mg  as needed  -Lorazepam 0.5mg  QHS -05/16/23 sleeping fair but  wants to try increased melatonin dose, inc to 5mg  at bedtime, monitor sleep for now-- improved 05/17/23! -05/18/2023 will increase Seroquel to 75 mg at bedtime -10/31 Increase seroquel to 100mg  HS  -Sleep-wake chart ordered -05/23/23 didn't sleep well but didn't get seroquel-- wrote it down for him to ask, discussed with nursing to also ask if he wants it tonight-- helped 05/24/23! -05/25/23 new order placed for sleep-wake chart -05/26/23 discussed sleep-wake monitoring with nursing, will increase lorazepam to 1 mg. Discussed options with pharmacy appreciate assistance -11/6 appears he is sleeping most of the night, will adjust schedule of his insomnia medications to start a little earlier.  Possibly memory deficits contributing to his feeling that he is having a hard time sleeping.  11/7 sleep chart indicates he has been sleeping okay overall, continue current regimen for now  11/8 patient continues to complain of insomnia even on days when sleep chart indicated he was sleeping 8 hours, will asked nursing to recheck sleep chart tonight -05/30/23 sleep chart indicates more sleep, but pt states he's not sleeping well; will increase melatonin to 10mg  tonight to see if this helps--improved 11/10 -11/11 continue to report poor sleep, I think he may do better at home due to more familiar environment 5. Neuropsych/cognition: This patient is  capable of making decisions on his own  behalf. 6. Skin/Wound Care: Routine pressure-relief measures.   --Monitor right elbow for drainage/healing. 7. Fluids/Electrolytes/Nutrition: Monitor I/os.  Continue Ensure supplements.             -- Add vitamin C/D3, MVI, zinc and Juven. 8. Pelvic ring fracture s/p percutaneous fixation: Strict NWB RLE and WBAT LLE for transfers only.  -- hardware loosening noted, NWB b/l LE, slide board transfers  -Air mattress for wound prevention  -11/5 x-ray Pelvis yesterday stable  11/7 with BX reviewed images 11/4.  Continue nonweightbearing bilateral lower extremities, okay for slide board transfers per Ortho.  1 therapy session daily.  Plan for repeat x-rays in about 2 weeks outpatient with Ortho. 9. Right elbow wound drainage: Now on doxycycline.  Reports night sweats.  Cultures sent -Discussed with Dr. Jena Gauss yesterday who recommended Doxycycline 100 mg twice daily, his team plans to follow-up and check on him and CIR.   -10/25 wound VAC started by Ortho appreciate assistance, continue current antibiotics, will recheck labs tomorrow -05/16/23 BCx and elbow Cx NGTD x3 days, WBC down to 10.4 today; monitor-- still NGTD 05/17/23 -10/28 WBC 12.0 today, continue doxycycline and wound VAC -10/30 wound vac removed by ortho -10/31 wound with small amount of draining, continue to monitor and continue abx, WBC down to 10.9 -11/1 orthopedics contacted regarding wound, continue antibiotics and monitor for now  11/6 consider discontinuation of antibiotics  11/7 will contact orthopedics regarding continuation of antibiotics- ok to discontinue and remove sutures -05/30/23 apparently sutures removed yesterday, nursing reports ortho was supposed to come by but no note written; still some purulent drainage expressible from medial aspect, some slough as well; contacted Montez Morita PA-C and Myrene Galas MD from ortho, Thyra Breed PA-C also responding since she saw pt yesterday; will restart Doxy x7 days, she will also  come by to see him Monday before he leaves. Appreciate their assistance, see notes filed for full info -Continue Doxy as above, seen by ortho today 10. Acute blood loss anemia: Monitor for any signs of bleeding.  Hgb stable 10.1 on 05/21/23  11. Leukocytosis: White count trending down from 23.6-->12.9>> 10.1 10/26; monitor routinely  -11/7 WBC a  little higher today at 11.1 12.  Thrombocytosis: Likely reactive.  Continue to monitor.  446 on 10/28 13. Low protein stores: Albumin down to 2.2--supplements added. 14. H/o NICM/HF s/p ICD:  Daily weights/monitor for signs of overload. Change diet to heart healthy.  No signs of fluid overload -- On Coreg 25mg  BID, aldactone 25mg  QD, Lasix 20mg  BID, and Pravachol 80mg  daily, Zetia 10mg  daily             --decrease Kdur to 20 meq bid as K trending up.  --Repeat chest x-ray in a.m.- no acute findings -10/29 recheck chest x-ray today- no acute changes -11/1 patient denies any shortness of breath, reports cough and congestion is better today -11/7 BP stable overall, continue to monitor.  Patient was started on O2 nasal cannula late this morning.  Patient denies shortness of breath.  O2 nasal cannula has been removed since this time and appears sats have been okay chest x-ray without acute findings -11/8 stable on RA -05/31/23 wt stable, BPs stable, cont regimen -11/11 no signs of fluid overload Filed Weights   05/30/23 0500 05/31/23 0454 06/01/23 0503  Weight: 108.4 kg 106.5 kg 106.3 kg      06/01/2023    5:03 AM 06/01/2023    4:09 AM 05/31/2023    7:53 PM  Vitals with BMI  Weight 234 lbs 6 oz    BMI 33.63    Systolic  143 146  Diastolic  57 82  Pulse  72 67     15. Multiple rib fractures: Encourage pulmonary hygiene. Incentive spirometer ordered  -10/29 patient bringing up more mucus today, may be due to the Mucinex   16. Urinary retention: Monitor voiding with PVR checks.  continue Flomax 0.8mg  daily.  -10/24 Required IC this AM,  continue to monitor  PVRs  -Timed voids every 4 hours, consider UA if urinary symptoms worsen -05/16/23 PVRs decreasing, hasn't needed cath since yesterday; monitor -05/17/23 needed cath last night for and in the afternoon yesterday; defer to weekday team regarding management of this -10/31 intermittently incontinent of bladder, suspect limited mobility is contributing.  Elevated bladder scan noted by nursing when I came in the room.  He denied sensation of needing to void.  We discussed offering him a urinal to try to void.  Appears he had a PVR later in the morning with 9 mL.  Timed voids ordered. -11/5 has not required IC in couple days, continue to monitor -11/6 discontinued PVRs   17. Constipation. Appears to be having regular Bms.   -Cont miralax prn -05/17/23 last BM this morning but loose, no stool softeners being given, likely from doxycycline, monitor for ongoing diarrhea -11/1 LBM yesterday but small, will start MiraLAX daily, sorbitol as needed ordered -05/23/23 LBM yesterday but pt reports no good one in a couple days, will increase miralax BID for now.  -11/11 LBM today  18.  Dysphagia.  SLP consult placed.  Diet decreased to D3 for now -Unclear cause of dysphagia, may be more chronic issue.  Continue G-tube thin diet.  Had MBS today.  Continue SLP.  -Continue D2 thin diet   LOS: 19 days A FACE TO FACE EVALUATION WAS PERFORMED  Fanny Dance 06/01/2023, 10:06 AM

## 2023-06-01 NOTE — Progress Notes (Signed)
Orthopaedic Trauma Progress Note  SUBJECTIVE: Doing well today. Not having much pain in the elbow currently. Restarted on Doxycycline x 7 days over the weekend.  Denies any fever or chills.  No nausea or vomiting.  Patient is eager for discharge today. No other complaints.   OBJECTIVE:  Vitals:   05/31/23 1953 06/01/23 0409  BP: (!) 146/82 (!) 143/57  Pulse: 67 72  Resp: 18 18  Temp: 97.9 F (36.6 C) 98 F (36.7 C)  SpO2: 97% 91%    General: Sitting up in bed, NAD Respiratory: No increased work of breathing.  RUE: Dressing changed. Incision as below. Medial border with dehiscence.  No active drainage from the wound currently.  Small amount of drainage on the gauze/Ace wrap.  Non-tender throughout extremity. Motor and sensory function intact distally. Hand warm and well perfused       IMAGING: Repeat imaging 05/25/2023 remained stable   LABS:  Results for orders placed or performed during the hospital encounter of 05/13/23 (from the past 24 hour(s))  Basic metabolic panel     Status: Abnormal   Collection Time: 06/01/23  5:17 AM  Result Value Ref Range   Sodium 134 (L) 135 - 145 mmol/L   Potassium 3.8 3.5 - 5.1 mmol/L   Chloride 98 98 - 111 mmol/L   CO2 29 22 - 32 mmol/L   Glucose, Bld 131 (H) 70 - 99 mg/dL   BUN 13 8 - 23 mg/dL   Creatinine, Ser 1.61 0.61 - 1.24 mg/dL   Calcium 9.0 8.9 - 09.6 mg/dL   GFR, Estimated >04 >54 mL/min   Anion gap 7 5 - 15  CBC     Status: Abnormal   Collection Time: 06/01/23  5:17 AM  Result Value Ref Range   WBC 9.6 4.0 - 10.5 K/uL   RBC 3.84 (L) 4.22 - 5.81 MIL/uL   Hemoglobin 11.9 (L) 13.0 - 17.0 g/dL   HCT 09.8 (L) 11.9 - 14.7 %   MCV 98.7 80.0 - 100.0 fL   MCH 31.0 26.0 - 34.0 pg   MCHC 31.4 30.0 - 36.0 g/dL   RDW 82.9 (H) 56.2 - 13.0 %   Platelets 285 150 - 400 K/uL   nRBC 0.0 0.0 - 0.2 %     ASSESSMENT: Louis Juarez. is a 82 y.o. male s/p REVISION FIXATION PELVIS 05/08/2023 REPEAT IRRIGATION AND DEBRIDEMENT RIGHT  ELBOW 05/08/2023  CV/Blood loss: Hemoglobin 12.9 on 05/28/2023.  Stable.  Hemodynamically stable  PLAN: Weightbearing:  RLE - NWB RLE BLE - NWB.  Okay for slide board transfers. ROM: BLE -unrestricted hip/knee/ankle ROM RUE -unrestricted ROM Incisional and dressing care:  RLE - okay to leave incisions open to air RUE - sutures removed today.  Continue changing dressing daily Showering: Okay to get lower extremity incisions wet.  Avoid soaking RUE incision Orthopedic device(s): None Pain management: Continue current regimen VTE prophylaxis:  Lovenox , SCDs ID: Doxycycline restarted 05/30/23 Impediments to Fracture Healing: Vit D level 29, continue supplementation.    Dispo: Continue Doxy for total of 7 days (stop date 06/05/23). Have reviewed clinical pictures with Dr. Jena Gauss, no plans for repeat I&D at this point. Ok for d/c from ortho standpoint. Continue cleaning elbow daily with soap and water and continue daily dressing changes (4x4 gauze, kerlix, ace wrap)   D/C recommendations: - Oxycodone, Robaxin for pain control - Eliquis 2.5 mg BID x 30 days for DVT prophylaxis -Continue 2000 mg Vit D supplementation daily x 90  days  Follow - up plan: Tuesday, 06/09/2023 at 10:45 AM with Dr. Jena Gauss for repeat x-rays pelvis and reevaluation of right elbow wound.  Contact information:  Truitt Merle MD, Thyra Breed PA-C. After hours and holidays please check [email or web address] for group call information for [facility]   Thompson Caul, PA-C [phone] (office) [email or web address]

## 2023-06-01 NOTE — Progress Notes (Signed)
Inpatient Rehabilitation Care Coordinator Discharge Note   Patient Details  Name: Louis Juarez. MRN: [id number] Date of Birth: 1941/05/05   Discharge location: HOME WITH WIFE AND SON WHO IS HERE FROM [place] TO ASSIST  Length of Stay: 19 DAYS  Discharge activity level: MIN-MOD WHEELCHAIR LEVEL DUE TO WB ISSUES  Home/community participation: ACTIVE  Patient response KG:MWNUUV Literacy - How often do you need to have someone help you when you read instructions, pamphlets, or other written material from your doctor or pharmacy?: Never  Patient response OZ:DGUYQI Isolation - How often do you feel lonely or isolated from those around you?: Rarely  Services provided included: MD, RD, PT, OT, SLP, RN, TR, CM, Neuropsych, Pharmacy, SW  Financial Services:  Field seismologist Utilized: Barrister's clerk AND MEDICARE PART A  Choices offered to/list presented to: PT AND WIFE  Follow-up services arranged:  Home Health, DME, Patient/Family has no preference for HH/DME agencies Home Health Agency: [facility] PT OT RN    DME : [facility] BED, WHEELCHAIR, TRANSFER BOARD, WIDE DROP-ARM BEDSIDE COMMODED  WIFE PURCHASED TUB BENCH AND BEDSIDE TABLE    Patient response to transportation need: Is the patient able to respond to transportation needs?: Yes In the past 12 months, has lack of transportation kept you from medical appointments or from getting medications?: No In the past 12 months, has lack of transportation kept you from meetings, work, or from getting things needed for daily living?: No   Patient/Family verbalized understanding of follow-up arrangements:  Yes  Individual responsible for coordination of the follow-up plan: [name] [phone]  Confirmed correct DME delivered: [name] 06/01/2023    Comments (or additional information):WIFE AND SON WERE IN FOR HANDS ON EDUCATION, FEEL PREPARED FOR DC. RAMP BEING BUILT FOR HOME AND FOLLOW UP  APPOINTMENTS.  Summary of Stay    Date/Time Discharge Planning CSW  05/27/23 1036 Home with wife and son coming from [place] if not here at DC has others to step in. Family training set up for tomorrow and have ordered DME. See if can obtain follow up a few visits due to will want to save for when can WB on his legs. RGD  05/20/23 0901 Home with wife and son coming from [place] to assist him. Friday wife reports they do not want a ramp at home and will figure out getting pt into home. Pt placed on neuro-psych list to be seen due to depression and frustration with situation RGD       Louis Juarez, Louis Juarez

## 2023-06-03 NOTE — Progress Notes (Signed)
Contacted wife to advise her to resume doxycycline till completed.

## 2023-06-27 ENCOUNTER — Encounter (HOSPITAL_COMMUNITY): Payer: Self-pay

## 2023-06-27 ENCOUNTER — Emergency Department (HOSPITAL_COMMUNITY): Payer: BC Managed Care – PPO

## 2023-06-27 ENCOUNTER — Other Ambulatory Visit: Payer: Self-pay

## 2023-06-27 ENCOUNTER — Inpatient Hospital Stay (HOSPITAL_COMMUNITY)
Admission: EM | Admit: 2023-06-27 | Discharge: 2023-07-04 | DRG: 177 | Disposition: A | Discharge status: Rehabilitation Facility | Payer: BC Managed Care – PPO | Attending: Internal Medicine | Admitting: Internal Medicine

## 2023-06-27 DIAGNOSIS — G9341 Metabolic encephalopathy: Secondary | ICD-10-CM | POA: Diagnosis not present

## 2023-06-27 DIAGNOSIS — K219 Gastro-esophageal reflux disease without esophagitis: Secondary | ICD-10-CM | POA: Diagnosis present

## 2023-06-27 DIAGNOSIS — I110 Hypertensive heart disease with heart failure: Secondary | ICD-10-CM | POA: Diagnosis present

## 2023-06-27 DIAGNOSIS — G9340 Encephalopathy, unspecified: Principal | ICD-10-CM

## 2023-06-27 DIAGNOSIS — I2510 Atherosclerotic heart disease of native coronary artery without angina pectoris: Secondary | ICD-10-CM | POA: Diagnosis present

## 2023-06-27 DIAGNOSIS — Z85828 Personal history of other malignant neoplasm of skin: Secondary | ICD-10-CM

## 2023-06-27 DIAGNOSIS — G4700 Insomnia, unspecified: Secondary | ICD-10-CM | POA: Diagnosis present

## 2023-06-27 DIAGNOSIS — Z85038 Personal history of other malignant neoplasm of large intestine: Secondary | ICD-10-CM

## 2023-06-27 DIAGNOSIS — T380X5A Adverse effect of glucocorticoids and synthetic analogues, initial encounter: Secondary | ICD-10-CM | POA: Diagnosis present

## 2023-06-27 DIAGNOSIS — Z95810 Presence of automatic (implantable) cardiac defibrillator: Secondary | ICD-10-CM

## 2023-06-27 DIAGNOSIS — E119 Type 2 diabetes mellitus without complications: Secondary | ICD-10-CM | POA: Diagnosis present

## 2023-06-27 DIAGNOSIS — U071 COVID-19: Principal | ICD-10-CM | POA: Diagnosis present

## 2023-06-27 DIAGNOSIS — Z87891 Personal history of nicotine dependence: Secondary | ICD-10-CM

## 2023-06-27 DIAGNOSIS — I428 Other cardiomyopathies: Secondary | ICD-10-CM

## 2023-06-27 DIAGNOSIS — N401 Enlarged prostate with lower urinary tract symptoms: Secondary | ICD-10-CM | POA: Diagnosis present

## 2023-06-27 DIAGNOSIS — I420 Dilated cardiomyopathy: Secondary | ICD-10-CM | POA: Diagnosis present

## 2023-06-27 DIAGNOSIS — R338 Other retention of urine: Secondary | ICD-10-CM | POA: Diagnosis present

## 2023-06-27 DIAGNOSIS — D72829 Elevated white blood cell count, unspecified: Secondary | ICD-10-CM | POA: Diagnosis present

## 2023-06-27 DIAGNOSIS — Z79899 Other long term (current) drug therapy: Secondary | ICD-10-CM

## 2023-06-27 DIAGNOSIS — Z808 Family history of malignant neoplasm of other organs or systems: Secondary | ICD-10-CM

## 2023-06-27 DIAGNOSIS — R5381 Other malaise: Secondary | ICD-10-CM | POA: Diagnosis present

## 2023-06-27 DIAGNOSIS — F329 Major depressive disorder, single episode, unspecified: Secondary | ICD-10-CM | POA: Diagnosis present

## 2023-06-27 DIAGNOSIS — Z66 Do not resuscitate: Secondary | ICD-10-CM | POA: Diagnosis present

## 2023-06-27 DIAGNOSIS — J9601 Acute respiratory failure with hypoxia: Secondary | ICD-10-CM | POA: Diagnosis present

## 2023-06-27 DIAGNOSIS — I5042 Chronic combined systolic (congestive) and diastolic (congestive) heart failure: Secondary | ICD-10-CM | POA: Diagnosis present

## 2023-06-27 DIAGNOSIS — Z7901 Long term (current) use of anticoagulants: Secondary | ICD-10-CM

## 2023-06-27 DIAGNOSIS — Z800 Family history of malignant neoplasm of digestive organs: Secondary | ICD-10-CM

## 2023-06-27 DIAGNOSIS — E782 Mixed hyperlipidemia: Secondary | ICD-10-CM | POA: Diagnosis present

## 2023-06-27 LAB — ETHANOL: Alcohol, Ethyl (B): <10 mg/dL (ref <10)

## 2023-06-27 LAB — I-STAT VENOUS BLOOD GAS, ED
Acid-Base Excess: 4 mmol/L — ABNORMAL HIGH (ref 0.0–2.0)
Bicarbonate: 31.3 mmol/L — ABNORMAL HIGH (ref 20.0–28.0)
Calcium, Ion: 1.24 mmol/L (ref 1.15–1.40)
HCT: 41 % (ref 39.0–52.0)
Hemoglobin: 13.9 g/dL (ref 13.0–17.0)
O2 Saturation: 28 %
Potassium: 4.2 mmol/L (ref 3.5–5.1)
Sodium: 142 mmol/L (ref 135–145)
TCO2: 33 mmol/L — ABNORMAL HIGH (ref 22–32)
pCO2, Ven: 60.1 mm[Hg] — ABNORMAL HIGH (ref 44–60)
pH, Ven: 7.324 (ref 7.25–7.43)
pO2, Ven: 21 mm[Hg] — CL (ref 32–45)

## 2023-06-27 LAB — CBC WITH DIFFERENTIAL/PLATELET
Abs Immature Granulocytes: 0.02 10*3/uL (ref 0.00–0.07)
Basophils Absolute: 0 10*3/uL (ref 0.0–0.1)
Basophils Relative: 1 %
Eosinophils Absolute: 0.5 10*3/uL (ref 0.0–0.5)
Eosinophils Relative: 7 %
HCT: 41.9 % (ref 39.0–52.0)
Hemoglobin: 13.3 g/dL (ref 13.0–17.0)
Immature Granulocytes: 0 %
Lymphocytes Relative: 21 %
Lymphs Abs: 1.7 10*3/uL (ref 0.7–4.0)
MCH: 31.3 pg (ref 26.0–34.0)
MCHC: 31.7 g/dL (ref 30.0–36.0)
MCV: 98.6 fL (ref 80.0–100.0)
Monocytes Absolute: 0.8 10*3/uL (ref 0.1–1.0)
Monocytes Relative: 10 %
Neutro Abs: 4.9 10*3/uL (ref 1.7–7.7)
Neutrophils Relative %: 61 %
Platelets: 224 10*3/uL (ref 150–400)
RBC: 4.25 MIL/uL (ref 4.22–5.81)
RDW: 14.6 % (ref 11.5–15.5)
WBC: 7.9 10*3/uL (ref 4.0–10.5)
nRBC: 0 % (ref 0.0–0.2)

## 2023-06-27 LAB — LIPASE, BLOOD: Lipase: 21 U/L (ref 11–51)

## 2023-06-27 LAB — URINALYSIS, W/ REFLEX TO CULTURE (INFECTION SUSPECTED)
Bacteria, UA: NONE SEEN
Bilirubin Urine: NEGATIVE
Glucose, UA: NEGATIVE mg/dL
Hgb urine dipstick: NEGATIVE
Ketones, ur: NEGATIVE mg/dL
Leukocytes,Ua: NEGATIVE
Nitrite: NEGATIVE
Protein, ur: NEGATIVE mg/dL
Specific Gravity, Urine: 1.013 (ref 1.005–1.030)
pH: 5 (ref 5.0–8.0)

## 2023-06-27 LAB — TROPONIN I (HIGH SENSITIVITY)
Troponin I (High Sensitivity): 8 ng/L (ref <18)
Troponin I (High Sensitivity): 7 ng/L (ref <18)

## 2023-06-27 LAB — COMPREHENSIVE METABOLIC PANEL
ALT: 12 U/L (ref 0–44)
AST: 11 U/L — ABNORMAL LOW (ref 15–41)
Albumin: 2.8 g/dL — ABNORMAL LOW (ref 3.5–5.0)
Alkaline Phosphatase: 116 U/L (ref 38–126)
Anion gap: 11 (ref 5–15)
BUN: 17 mg/dL (ref 8–23)
CO2: 28 mmol/L (ref 22–32)
Calcium: 9.5 mg/dL (ref 8.9–10.3)
Chloride: 102 mmol/L (ref 98–111)
Creatinine, Ser: 1.03 mg/dL (ref 0.61–1.24)
GFR, Estimated: >60 mL/min (ref >60)
Glucose, Bld: 110 mg/dL — ABNORMAL HIGH (ref 70–99)
Potassium: 4.2 mmol/L (ref 3.5–5.1)
Sodium: 141 mmol/L (ref 135–145)
Total Bilirubin: 0.5 mg/dL (ref <1.2)
Total Protein: 7.3 g/dL (ref 6.5–8.1)

## 2023-06-27 LAB — RAPID URINE DRUG SCREEN, HOSP PERFORMED
Amphetamines: NOT DETECTED
Barbiturates: NOT DETECTED
Benzodiazepines: POSITIVE — AB
Cocaine: NOT DETECTED
Opiates: NOT DETECTED
Tetrahydrocannabinol: NOT DETECTED

## 2023-06-27 LAB — I-STAT CHEM 8, ED
BUN: 20 mg/dL (ref 8–23)
Calcium, Ion: 1.25 mmol/L (ref 1.15–1.40)
Chloride: 103 mmol/L (ref 98–111)
Creatinine, Ser: 1.1 mg/dL (ref 0.61–1.24)
Glucose, Bld: 107 mg/dL — ABNORMAL HIGH (ref 70–99)
HCT: 41 % (ref 39.0–52.0)
Hemoglobin: 13.9 g/dL (ref 13.0–17.0)
Potassium: 4.2 mmol/L (ref 3.5–5.1)
Sodium: 142 mmol/L (ref 135–145)
TCO2: 28 mmol/L (ref 22–32)

## 2023-06-27 LAB — RESP PANEL BY RT-PCR (RSV, FLU A&B, COVID)  RVPGX2
Influenza A by PCR: NEGATIVE
Influenza B by PCR: NEGATIVE
Resp Syncytial Virus by PCR: NEGATIVE
SARS Coronavirus 2 by RT PCR: POSITIVE — AB

## 2023-06-27 LAB — I-STAT CG4 LACTIC ACID, ED
Lactic Acid, Venous: 1 mmol/L (ref 0.5–1.9)
Lactic Acid, Venous: 1 mmol/L (ref 0.5–1.9)

## 2023-06-27 LAB — AMMONIA: Ammonia: 16 umol/L (ref 9–35)

## 2023-06-27 MED ORDER — ENOXAPARIN SODIUM 40 MG/0.4ML IJ SOSY
40.0000 mg | PREFILLED_SYRINGE | INTRAMUSCULAR | Status: DC
  Administered 2023-06-27: 40 mg via SUBCUTANEOUS
  Filled 2023-06-27: qty 0.4

## 2023-06-27 MED ORDER — ACETAMINOPHEN 325 MG PO TABS
650.0000 mg | ORAL_TABLET | Freq: Four times a day (QID) | ORAL | Status: DC | PRN
  Administered 2023-06-28 – 2023-07-04 (×4): 650 mg via ORAL
  Filled 2023-06-27 (×4): qty 2

## 2023-06-27 MED ORDER — ACETAMINOPHEN 650 MG RE SUPP: 650.0000 mg | Freq: Four times a day (QID) | RECTAL | Status: DC | PRN

## 2023-06-27 MED ORDER — SODIUM CHLORIDE 0.9 % IV SOLN
200.0000 mg | Freq: Once | INTRAVENOUS | Status: AC
  Administered 2023-06-27: 200 mg via INTRAVENOUS
  Filled 2023-06-27: qty 40

## 2023-06-27 MED ORDER — DEXAMETHASONE 6 MG PO TABS
6.0000 mg | ORAL_TABLET | Freq: Every day | ORAL | Status: DC
  Administered 2023-06-27 – 2023-07-04 (×8): 6 mg via ORAL
  Filled 2023-06-27 (×8): qty 1

## 2023-06-27 MED ORDER — SODIUM CHLORIDE 0.9 % IV SOLN
100.0000 mg | Freq: Every day | INTRAVENOUS | Status: DC
  Administered 2023-06-28: 100 mg via INTRAVENOUS
  Filled 2023-06-27 (×2): qty 20

## 2023-06-27 MED ORDER — SENNOSIDES-DOCUSATE SODIUM 8.6-50 MG PO TABS: 1.0000 | ORAL_TABLET | Freq: Every evening | ORAL | Status: DC | PRN

## 2023-06-27 MED ORDER — SODIUM CHLORIDE 0.9 % IV BOLUS
500.0000 mL | Freq: Once | INTRAVENOUS | Status: AC
  Administered 2023-06-27: 500 mL via INTRAVENOUS

## 2023-06-27 NOTE — ED Notes (Signed)
[employer] called to send a rep out for pt's device interrogation.

## 2023-06-27 NOTE — ED Notes (Signed)
[employer] called & said approx. ETA for their arrival.

## 2023-06-27 NOTE — ED Provider Notes (Signed)
Emergency Department Provider Note   I have reviewed the triage vital signs and the nursing notes.   HISTORY  Chief Complaint No chief complaint on file.   HPI Louis Juarez. is a 82 y.o. male with past history of dilated cardiomyopathy, AICD, hypertension, hyperlipidemia, diabetes presents to the emergency department evaluation of acute mental status change.  Family at bedside report patient was his normal self yesterday.  He had a prolonged hospitalization in [place] for a fall from a tree.  He sustained multiple pelvic fractures and open olecranon fracture on the right.  These were repaired by Dr. Jena Gauss.  He spent some time in rehab and was ultimately discharged home.  Family state he has been in his normal state of health.  He is no longer taking oxycodone's and only requires Tylenol for pain.  He had developed some clear drainage from the right elbow wound yesterday and family communicated with Dr. Jena Gauss who called in a prescription for Bactrim.  He is taken 1 dose of this but no other new medicines.  Family states that several days ago he did slide off the bed while urinating landing on the right shoulder and elbow.  They went to urgent care for imaging which was negative and discharged home.  This morning, he was somnolent, difficult to arouse and family called EMS for ED evaluation.  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  Bilateral pleural effusions,  Question mild subpleural reticulation, indicative of fibrosis.  Coronary artery calcification. 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 Wall thickness was increased in a pattern of mild LVH.- Aortic valve: AV is thckened, calcified with no signifi stenosis.The atrium was severely dilated.: Systolic function was moderately reduced.The atrium was mildly dilated.PA peak pre   Nonischemic cardiomyopathy (HCC)    Palpitations    Pneumonia    PVC (premature ventricular contraction) 07/02/2011   PVC's (premature ventricular contractions)    Wheezing 04/29/2011   Sinus ct 05/2011:    Wheezing     Review of Systems  Level 5 caveat: AMS  ____________________________________________   PHYSICAL EXAM:  VITAL SIGNS: ED Triage Vitals [06/27/23 1020]  Encounter Vitals Group     BP 125/70  Pulse Rate 66     Resp (!) 28     Temp 98 F (36.7 C)     Temp Source Rectal     SpO2 100 %   Constitutional: Patient is somnolent but arouses to voice, makes eye contact.  Eyes: Conjunctivae are normal.  Head: Atraumatic. Nose: No congestion/rhinnorhea. Mouth/Throat: Mucous membranes are dry.  Neck: No stridor.   Cardiovascular: Normal rate, regular rhythm. Good peripheral circulation. Respiratory: Normal  respiratory effort.  No retractions. Lungs CTAB. Gastrointestinal: Soft and nontender. No distention.  Musculoskeletal: RLE shortened and externally rotated.  Neurologic: Opens eyes to voice, makes eye contact, mouth is name to me but not conversational.  Skin:  Skin is warm, dry.  Mild erythema and swelling to the right elbow with scant, clear discharge from the surgical site.   ____________________________________________   LABS (all labs ordered are listed, but only abnormal results are displayed)  Labs Reviewed  CULTURE, BLOOD (ROUTINE X 2)  CULTURE, BLOOD (ROUTINE X 2)  RESP PANEL BY RT-PCR (RSV, FLU A&B, COVID)  RVPGX2  CBC WITH DIFFERENTIAL/PLATELET  LIPASE, BLOOD  ETHANOL  COMPREHENSIVE METABOLIC PANEL  RAPID URINE DRUG SCREEN, HOSP PERFORMED  URINALYSIS, W/ REFLEX TO CULTURE (INFECTION SUSPECTED)  AMMONIA  I-STAT CG4 LACTIC ACID, ED  TROPONIN I (HIGH SENSITIVITY)   ____________________________________________  EKG   EKG Interpretation Date/Time:  Saturday June 27 2023 10:22:18 EST Ventricular Rate:  65 PR Interval:  147 QRS Duration:  140 QT Interval:  475 QTC Calculation: 494 R Axis:   -81  Text Interpretation: Sinus rhythm Ventricular premature complex Nonspecific IVCD with LAD Anterolateral infarct, age indeterminate Confirmed by Alona Bene [phone]) on 06/27/2023 10:36:22 AM        ____________________________________________  RADIOLOGY  No results found.  ____________________________________________   PROCEDURES  Procedure(s) performed:   Procedures   ____________________________________________   INITIAL IMPRESSION / ASSESSMENT AND PLAN / ED COURSE  Pertinent labs & imaging results that were available during my care of the patient were reviewed by me and considered in my medical decision making (see chart for details).   This patient is Presenting for Evaluation of AMS, which does require a range of treatment options, and is a  complaint that involves a high risk of morbidity and mortality.  The Differential Diagnoses includes but is not exclusive to alcohol, illicit or prescription medications, intracranial pathology such as stroke, intracerebral hemorrhage, fever or infectious causes including sepsis, hypoxemia, uremia, trauma, endocrine related disorders such as diabetes, hypoglycemia, thyroid-related diseases, etc.   Critical Interventions-    Medications  sodium chloride 0.9 % bolus 500 mL (has no administration in time range)    Reassessment after intervention:     I did obtain Additional Historical Information from family at bedside.  I decided to review pertinent External Data, and in summary patient with prolonged hospital and rehab stay in the hospital from early October to November.   Clinical Laboratory Tests Ordered, included ***  Radiologic Tests Ordered, included ***. I independently interpreted the images and agree with radiology interpretation.   Cardiac Monitor Tracing which shows NSR.    Social Determinants of Health Risk patient is a non-smoker.   Consult complete with  Medical Decision Making: Summary:  Presents to the emergency department for evaluation of altered mental status starting this morning.  Minimal erythema/warmth to the right elbow with some scant clear drainage.  Possible source of infection although lower suspicion for this overall.  Vital signs are largely unremarkable upon arrival  of the new O2 requirement.  Plan for head imaging given altered mental status, labs, cultures, reassess.  Reevaluation with update and discussion with   ***Considered admission***  Patient's presentation is most consistent with acute presentation with potential threat to life or bodily function.   Disposition:   ____________________________________________  FINAL CLINICAL IMPRESSION(S) / ED DIAGNOSES  Final diagnoses:  None     NEW OUTPATIENT MEDICATIONS STARTED DURING THIS  VISIT:  New Prescriptions   No medications on file    Note:  This document was prepared using Dragon voice recognition software and may include unintentional dictation errors.  Alona Bene, MD, [facility]

## 2023-06-27 NOTE — ED Triage Notes (Signed)
Pt BIB [facility] from home for AMS.  Family called b/c pt is not responding like normal.  Normally moves arms and legs.  Has fallen a couple of time recently.  Has wound to right elbow. Began ABX yesterday. Pt feels hot to touch per EMS.   A&O x2 person/place. Normally A& x4 and following commands.  LKW 2000 yesterday.   134/62  86% RA initially, 94% 2L, HR 65 paced (ICD) RR 32 ETC02 14-20.CBG 134  18G L, AC

## 2023-06-27 NOTE — ED Notes (Signed)
ED TO INPATIENT HANDOFF REPORT  ED Nurse Name and Phone #: [phone]  S Name/Age/Gender Louis Juarez. 82 y.o. male Room/Bed: 040C/040C  Code Status   Code Status: Limited: Do not attempt resuscitation (DNR) -DNR-LIMITED -Do Not Intubate/DNI   Home/SNF/Other Home Patient oriented to: self Is this baseline? Yes   Triage Complete: Triage complete  Chief Complaint Acute metabolic encephalopathy [G93.41]  Triage Note Pt BIB [facility] from home for AMS.  Family called b/c pt is not responding like normal.  Normally moves arms and legs.  Has fallen a couple of time recently.  Has wound to right elbow. Began ABX yesterday. Pt feels hot to touch per EMS.   A&O x2 person/place. Normally A& x4 and following commands.  LKW 2000 yesterday.   134/62  86% RA initially, 94% 2L, HR 65 paced (ICD) RR 32 ETC02 14-20.CBG 134  18G L, AC   Allergies Allergies  Allergen Reactions   Morphine Other (See Comments)    Agitation/hallucinations    Level of Care/Admitting Diagnosis ED Disposition     ED Disposition  Admit   Condition  --   Comment  Hospital Area: [facility] [[id number]]  Level of Care: Progressive [102]  Admit to Progressive based on following criteria: NEUROLOGICAL AND NEUROSURGICAL complex patients with significant risk of instability, who do not meet ICU criteria, yet require close observation or frequent assessment (< / = every 2 - 4 hours) with medical / nursing intervention.  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 Bilateral pleural effusions,  Question mild subpleural reticulation, indicative of fibrosis.  Coronary artery calcification. 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 Wall thickness was increased in a pattern of mild LVH.- Aortic valve: AV is thckened, calcified with no signifi stenosis.The atrium was severely dilated.: Systolic function was moderately reduced.The atrium was mildly dilated.PA peak pre   Nonischemic cardiomyopathy (HCC)    Palpitations  Pneumonia    PVC (premature ventricular contraction) 07/02/2011   PVC's (premature ventricular contractions)    Wheezing 04/29/2011   Sinus ct 05/2011:    Wheezing    Past Surgical History:  Procedure Laterality Date   BI-VENTRICULAR IMPLANTABLE CARDIOVERTER DEFIBRILLATOR N/A 02/13/2012   Procedure: BI-VENTRICULAR IMPLANTABLE CARDIOVERTER DEFIBRILLATOR   (CRT-D);  Surgeon: Hillis Range, MD;  Location: [facility] CATH LAB;  Service: Cardiovascular;  Laterality: N/A;   BI-VENTRICULAR IMPLANTABLE CARDIOVERTER DEFIBRILLATOR  (CRT-D)  02/13/2012   BI-VENTRICULAR IMPLANTABLE CARDIOVERTER DEFIBRILLATOR  (CRT-D)  05/03/2019   CHANGEOUT   BIV ICD GENERATOR CHANGEOUT N/A 05/03/2019   Procedure: BIV ICD GENERATOR CHANGEOUT;  Surgeon: Hillis Range, MD;  Location: [facility] INVASIVE CV LAB;  Service: Cardiovascular;  Laterality: N/A;   CARDIAC CATHETERIZATION  2013   CARDIAC DEFIBRILLATOR PLACEMENT  02/13/2012   SJM [employer] BIV ICD implanted by Dr Johney Frame   IRRIGATION AND DEBRIDEMENT ELBOW Right 05/08/2023   Procedure: IRRIGATION AND DEBRIDEMENT ELBOW;  Surgeon: Roby Lofts, MD;  Location: [facility] OR;  Service: Orthopedics;  Laterality: Right;   IRRIGATION AND DEBRIDEMENT ELBOW Right 04/24/2023   Procedure: IRRIGATION AND DEBRIDEMENT ELBOW;  Surgeon: Roby Lofts, MD;  Location: [facility] OR;  Service: Orthopedics;  Laterality: Right;   NASAL SINUS SURGERY  93, 97, 2010, 2006   NASAL SINUS SURGERY     1993, 1997, 2006, 2010   ORIF PELVIC FRACTURE Right 05/08/2023   Procedure: OPEN REDUCTION INTERNAL FIXATION (ORIF) PELVIC FRACTURE;  Surgeon: Roby Lofts, MD;  Location: [facility] OR;  Service: Orthopedics;  Laterality: Right;   ORIF PELVIC FRACTURE WITH PERCUTANEOUS SCREWS Bilateral 04/24/2023   Procedure: ORIF PELVIC FRACTURE WITH PERCUTANEOUS SCREWS;  Surgeon: Roby Lofts, MD;  Location: [facility] OR;  Service: Orthopedics;  Laterality: Bilateral;     A IV Location/Drains/Wounds Patient Lines/Drains/Airways Status     Active Line/Drains/Airways     Name Placement date Placement time Site Days   Peripheral IV 06/27/23 18 G Left Antecubital 06/27/23  1019  Antecubital  less than 1   Peripheral IV 06/27/23 20 G 1" Anterior;Distal;Left Forearm 06/27/23  1041  Forearm  less than 1            Intake/Output Last 24 hours No intake or output data in the 24 hours ending  06/27/23 1745  Labs/Imaging Results for orders placed or performed during the hospital encounter of 06/27/23 (from the past 48 hour(s))  Resp panel by RT-PCR (RSV, Flu A&B, Covid) Anterior Nasal Swab     Status: Abnormal   Collection Time: 06/27/23 10:35 AM   Specimen: Anterior Nasal Swab  Result Value Ref Range   SARS Coronavirus 2 by RT PCR POSITIVE (A) NEGATIVE   Influenza A by PCR NEGATIVE NEGATIVE   Influenza B by PCR NEGATIVE NEGATIVE    Comment: (NOTE) The Xpert Xpress SARS-CoV-2/FLU/RSV plus assay is intended as an aid in the diagnosis of influenza from Nasopharyngeal swab specimens and should not be used as a sole basis for treatment. Nasal washings and aspirates are unacceptable for Xpert Xpress SARS-CoV-2/FLU/RSV testing.  Fact Sheet for Patients: BloggerCourse.com  Fact Sheet for Healthcare Providers: SeriousBroker.it  This test is not yet approved or cleared by the [place] FDA and has been authorized for detection and/or diagnosis of SARS-CoV-2 by FDA under an Emergency Use Authorization (EUA). This EUA will remain in effect (meaning this test can be used) for the duration of the COVID-19 declaration under Section 564(b)(1) of the Act, 21 U.S.C. section 360bbb-3(b)(1),  unless the authorization is terminated or revoked.     Resp Syncytial Virus by PCR NEGATIVE NEGATIVE    Comment: (NOTE) Fact Sheet for Patients: BloggerCourse.com  Fact Sheet for Healthcare Providers: SeriousBroker.it  This test is not yet approved or cleared by the [place] FDA and has been authorized for detection and/or diagnosis of SARS-CoV-2 by FDA under an Emergency Use Authorization (EUA). This EUA will remain in effect (meaning this test can be used) for the duration of the COVID-19 declaration under Section 564(b)(1) of the Act, 21 U.S.C. section 360bbb-3(b)(1), unless the  authorization is terminated or revoked.  Performed at [facility], [address], [place], [place] [address]   CBC with Differential     Status: None   Collection Time: 06/27/23 10:50 AM  Result Value Ref Range   WBC 7.9 4.0 - 10.5 K/uL   RBC 4.25 4.22 - 5.81 MIL/uL   Hemoglobin 13.3 13.0 - 17.0 g/dL   HCT 13.0 86.5 - 78.4 %   MCV 98.6 80.0 - 100.0 fL   MCH 31.3 26.0 - 34.0 pg   MCHC 31.7 30.0 - 36.0 g/dL   RDW 69.6 29.5 - 28.4 %   Platelets 224 150 - 400 K/uL   nRBC 0.0 0.0 - 0.2 %   Neutrophils Relative % 61 %   Neutro Abs 4.9 1.7 - 7.7 K/uL   Lymphocytes Relative 21 %   Lymphs Abs 1.7 0.7 - 4.0 K/uL   Monocytes Relative 10 %   Monocytes Absolute 0.8 0.1 - 1.0 K/uL   Eosinophils Relative 7 %   Eosinophils Absolute 0.5 0.0 - 0.5 K/uL   Basophils Relative 1 %   Basophils Absolute 0.0 0.0 - 0.1 K/uL   Immature Granulocytes 0 %   Abs Immature Granulocytes 0.02 0.00 - 0.07 K/uL    Comment: Performed at [facility], [address] [place], [place] [address]  Lipase, blood     Status: None   Collection Time: 06/27/23 10:50 AM  Result Value Ref Range   Lipase 21 11 - 51 U/L    Comment: Performed at [facility], [address], [place], [place] [address]  Troponin I (High Sensitivity)     Status: None   Collection Time: 06/27/23 10:50 AM  Result Value Ref Range   Troponin I (High Sensitivity) 8 <18 ng/L    Comment: (NOTE) Elevated high sensitivity troponin I (hsTnI) values and significant  changes across serial measurements may suggest ACS but many other  chronic and acute conditions are known to elevate hsTnI results.  Refer to the "Links" section for chest pain algorithms and additional  guidance. Performed at [facility], [address], [place], [place] [address]   Ethanol     Status: None   Collection Time: 06/27/23 10:50 AM  Result Value Ref Range   Alcohol, Ethyl (B) <10 <10 mg/dL    Comment: (NOTE) Lowest detectable limit for serum  alcohol is 10 mg/dL.  For medical purposes only. Performed at [facility], [address], [place], [place] [address]   Comprehensive metabolic panel     Status: Abnormal   Collection Time: 06/27/23 10:50 AM  Result Value Ref Range   Sodium 141 135 - 145 mmol/L   Potassium 4.2 3.5 - 5.1 mmol/L   Chloride 102 98 - 111 mmol/L   CO2 28 22 - 32 mmol/L   Glucose, Bld 110 (H) 70 - 99 mg/dL  Comment: Glucose reference range applies only to samples taken after fasting for at least 8 hours.   BUN 17 8 - 23 mg/dL   Creatinine, Ser 0.98 0.61 - 1.24 mg/dL   Calcium 9.5 8.9 - 11.9 mg/dL   Total Protein 7.3 6.5 - 8.1 g/dL   Albumin 2.8 (L) 3.5 - 5.0 g/dL   AST 11 (L) 15 - 41 U/L   ALT 12 0 - 44 U/L   Alkaline Phosphatase 116 38 - 126 U/L   Total Bilirubin 0.5 <1.2 mg/dL   GFR, Estimated >14 >78 mL/min    Comment: (NOTE) Calculated using the CKD-EPI Creatinine Equation (2021)    Anion gap 11 5 - 15    Comment: Performed at [facility], [address], [place], [place] [address]  Ammonia     Status: None   Collection Time: 06/27/23 10:50 AM  Result Value Ref Range   Ammonia 16 9 - 35 umol/L    Comment: Performed at [facility], [address] [place], [place] [address]  I-Stat venous blood gas, [facility], [facility], [facility])     Status: Abnormal   Collection Time: 06/27/23 11:00 AM  Result Value Ref Range   pH, Ven 7.324 7.25 - 7.43   pCO2, Ven 60.1 (H) 44 - 60 mmHg   pO2, Ven 21 (LL) 32 - 45 mmHg   Bicarbonate 31.3 (H) 20.0 - 28.0 mmol/L   TCO2 33 (H) 22 - 32 mmol/L   O2 Saturation 28 %   Acid-Base Excess 4.0 (H) 0.0 - 2.0 mmol/L   Sodium 142 135 - 145 mmol/L   Potassium 4.2 3.5 - 5.1 mmol/L   Calcium, Ion 1.24 1.15 - 1.40 mmol/L   HCT 41.0 39.0 - 52.0 %   Hemoglobin 13.9 13.0 - 17.0 g/dL   Sample type VENOUS    Comment NOTIFIED PHYSICIAN   I-stat chem 8, ED (not at [facility], [facility] or [facility])     Status: Abnormal   Collection Time: 06/27/23 11:00 AM  Result Value Ref Range    Sodium 142 135 - 145 mmol/L   Potassium 4.2 3.5 - 5.1 mmol/L   Chloride 103 98 - 111 mmol/L   BUN 20 8 - 23 mg/dL   Creatinine, Ser 5.78 0.61 - 1.24 mg/dL   Glucose, Bld 469 (H) 70 - 99 mg/dL    Comment: Glucose reference range applies only to samples taken after fasting for at least 8 hours.   Calcium, Ion 1.25 1.15 - 1.40 mmol/L   TCO2 28 22 - 32 mmol/L   Hemoglobin 13.9 13.0 - 17.0 g/dL   HCT 62.9 52.8 - 41.3 %  I-Stat Lactic Acid     Status: None   Collection Time: 06/27/23 11:01 AM  Result Value Ref Range   Lactic Acid, Venous 1.0 0.5 - 1.9 mmol/L  Troponin I (High Sensitivity)     Status: None   Collection Time: 06/27/23  2:40 PM  Result Value Ref Range   Troponin I (High Sensitivity) 7 <18 ng/L    Comment: (NOTE) Elevated high sensitivity troponin I (hsTnI) values and significant  changes across serial measurements may suggest ACS but many other  chronic and acute conditions are known to elevate hsTnI results.  Refer to the "Links" section for chest pain algorithms and additional  guidance. Performed at [facility], [address], [place], [place] [address]   Urine rapid drug screen (hosp performed)     Status: Abnormal  Collection Time: 06/27/23  2:45 PM  Result Value Ref Range   Opiates NONE DETECTED NONE DETECTED   Cocaine NONE DETECTED NONE DETECTED   Benzodiazepines POSITIVE (A) NONE DETECTED   Amphetamines NONE DETECTED NONE DETECTED   Tetrahydrocannabinol NONE DETECTED NONE DETECTED   Barbiturates NONE DETECTED NONE DETECTED    Comment: (NOTE) DRUG SCREEN FOR MEDICAL PURPOSES ONLY.  IF CONFIRMATION IS NEEDED FOR ANY PURPOSE, NOTIFY LAB WITHIN 5 DAYS.  LOWEST DETECTABLE LIMITS FOR URINE DRUG SCREEN Drug Class                     Cutoff (ng/mL) Amphetamine and metabolites    1000 Barbiturate and metabolites    200 Benzodiazepine                 200 Opiates and metabolites        300 Cocaine and metabolites        300 THC                             50 Performed at [facility], [address] [place], [place] [address]   Urinalysis, w/ Reflex to Culture (Infection Suspected) -Urine, Clean Catch     Status: None   Collection Time: 06/27/23  2:45 PM  Result Value Ref Range   Specimen Source URINE, CLEAN CATCH    Color, Urine YELLOW YELLOW   APPearance CLEAR CLEAR   Specific Gravity, Urine 1.013 1.005 - 1.030   pH 5.0 5.0 - 8.0   Glucose, UA NEGATIVE NEGATIVE mg/dL   Hgb urine dipstick NEGATIVE NEGATIVE   Bilirubin Urine NEGATIVE NEGATIVE   Ketones, ur NEGATIVE NEGATIVE mg/dL   Protein, ur NEGATIVE NEGATIVE mg/dL   Nitrite NEGATIVE NEGATIVE   Leukocytes,Ua NEGATIVE NEGATIVE   RBC / HPF 0-5 0 - 5 RBC/hpf   WBC, UA 0-5 0 - 5 WBC/hpf    Comment:        Reflex urine culture not performed if WBC <=10, OR if Squamous epithelial cells >5. If Squamous epithelial cells >5 suggest recollection.    Bacteria, UA NONE SEEN NONE SEEN   Squamous Epithelial / HPF 0-5 0 - 5 /HPF   Mucus PRESENT     Comment: Performed at [facility], [address], [place], [place] [address]  I-Stat Lactic Acid     Status: None   Collection Time: 06/27/23  2:56 PM  Result Value Ref Range   Lactic Acid, Venous 1.0 0.5 - 1.9 mmol/L   CT Head Wo Contrast  Result Date: 06/27/2023 CLINICAL DATA:  Altered mental status, head and neck pain due to trauma. EXAM: CT HEAD WITHOUT CONTRAST CT CERVICAL SPINE WITHOUT CONTRAST TECHNIQUE: Multidetector CT imaging of the head and cervical spine was performed following the standard protocol without intravenous contrast. Multiplanar CT image reconstructions of the cervical spine were also generated. RADIATION DOSE REDUCTION: This exam was performed according to the departmental dose-optimization program which includes automated exposure control, adjustment of the mA and/or kV according to patient size and/or use of iterative reconstruction technique. COMPARISON:  CT head and cervical spine dated 04/24/2023.  FINDINGS: Motion artifact limits the sensitivity of the exam. CT HEAD FINDINGS Brain: No evidence of acute infarction, hemorrhage, hydrocephalus, extra-axial collection or mass lesion/mass effect. There is mild cerebral volume loss with associated ex vacuo dilatation. Periventricular white matter hypoattenuation likely represents chronic small vessel ischemic disease. Chronic lacunar infarct  in the left basal ganglia is redemonstrated. Vascular: There are vascular calcifications in the carotid siphons. Skull: Normal. Negative for fracture or focal lesion. Sinuses/Orbits: Bilateral ethmoid sinus disease. Other: None. CT CERVICAL SPINE FINDINGS Alignment: Normal. Skull base and vertebrae: No acute fracture. No primary bone lesion or focal pathologic process. Soft tissues and spinal canal: No prevertebral fluid or swelling. No visible canal hematoma. Disc levels: Up to severe multilevel degenerative disc and joint disease which results in moderate neuroforaminal and mild central canal stenosis. Upper chest: Negative. Other: Atherosclerotic calcifications are seen in the carotid arteries. IMPRESSION: 1. No acute intracranial abnormality. 2. No acute fracture or traumatic subluxation of the cervical spine. 3. Bilateral ethmoid sinus disease. Electronically Signed   By: Romona Curls M.D.   On: 06/27/2023 11:57   CT Cervical Spine Wo Contrast  Result Date: 06/27/2023 CLINICAL DATA:  Altered mental status, head and neck pain due to trauma. EXAM: CT HEAD WITHOUT CONTRAST CT CERVICAL SPINE WITHOUT CONTRAST TECHNIQUE: Multidetector CT imaging of the head and cervical spine was performed following the standard protocol without intravenous contrast. Multiplanar CT image reconstructions of the cervical spine were also generated. RADIATION DOSE REDUCTION: This exam was performed according to the departmental dose-optimization program which includes automated exposure control, adjustment of the mA and/or kV according to  patient size and/or use of iterative reconstruction technique. COMPARISON:  CT head and cervical spine dated 04/24/2023. FINDINGS: Motion artifact limits the sensitivity of the exam. CT HEAD FINDINGS Brain: No evidence of acute infarction, hemorrhage, hydrocephalus, extra-axial collection or mass lesion/mass effect. There is mild cerebral volume loss with associated ex vacuo dilatation. Periventricular white matter hypoattenuation likely represents chronic small vessel ischemic disease. 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 Vascular: There are vascular calcifications in the carotid siphons. Skull: Normal. Negative for fracture or focal lesion. Sinuses/Orbits: Bilateral ethmoid sinus disease. Other: None. CT CERVICAL SPINE FINDINGS Alignment: Normal. Skull base and vertebrae: No acute fracture. No primary bone lesion or focal pathologic process. Soft tissues and spinal canal: No prevertebral fluid or swelling. No visible canal hematoma. Disc levels: Up to severe multilevel degenerative disc and joint disease which results in moderate neuroforaminal and mild central canal stenosis. Upper chest: Negative. Other: Atherosclerotic calcifications are seen in the carotid arteries. IMPRESSION: 1. No acute intracranial abnormality. 2. No acute fracture or traumatic subluxation of the cervical spine. 3. Bilateral ethmoid sinus disease. Electronically Signed   By: Romona Curls M.D.   On: 06/27/2023 11:57   DG Pelvis Portable  Result Date: 06/27/2023 CLINICAL DATA:  Multiple recent falls. EXAM: PORTABLE PELVIS 1-2 VIEWS COMPARISON:  05/25/2023 FINDINGS: Stable pelvic fixation hardware and symphysis pubis diastasis. No acute fracture or dislocation. Lower lumbar spine degenerative changes. IMPRESSION: No acute abnormality. Electronically Signed   By: Beckie Salts M.D.   On: 06/27/2023 11:30   DG Chest Portable 1 View  Result Date: 06/27/2023 CLINICAL DATA:  Altered mental status.  Multiple  recent falls. EXAM: PORTABLE CHEST 1 VIEW COMPARISON:  Earlier today. FINDINGS: Decreased inspiration. Normal-sized heart, magnified by the decreased inspiration and portable AP technique. Crowding of the pulmonary vasculature and lung markings by the poor inspiration with no significant change in mild chronic interstitial prominence. Stable left subclavian pacer and AICD leads. Interval patient rotation to the right. Thoracic spine degenerative changes. IMPRESSION: 1. Decreased inspiration with no significant change in mild chronic interstitial prominence. 2. No acute abnormality. Electronically Signed   By: Zada Finders.D.  On: 06/27/2023 11:28    Pending Labs Unresulted Labs (From admission, onward)     Start     Ordered   06/28/23 0500  Basic metabolic panel  Tomorrow morning,   R        06/27/23 1424   06/28/23 0500  CBC  Tomorrow morning,   R        06/27/23 1424   06/27/23 1035  Culture, blood (routine x 2)  BLOOD CULTURE X 2,   R (with STAT occurrences)      06/27/23 1035            Vitals/Pain Today's Vitals   06/27/23 1450 06/27/23 1736 06/27/23 1740 06/27/23 1740  BP:  135/68    Pulse: 66 65    Resp: 18 (!) 21    Temp:  98.1 F (36.7 C)    TempSrc:  Axillary    SpO2: 100% 100%  100%  PainSc:   0-No pain     Isolation Precautions Airborne and Contact precautions  Medications Medications  enoxaparin (LOVENOX) injection 40 mg (has no administration in time range)  acetaminophen (TYLENOL) tablet 650 mg (has no administration in time range)    Or  acetaminophen (TYLENOL) suppository 650 mg (has no administration in time range)  senna-docusate (Senokot-S) tablet 1 tablet (has no administration in time range)  sodium chloride 0.9 % bolus 500 mL (0 mLs Intravenous Stopped 06/27/23 1159)    Mobility weakness     Focused Assessments AMS and wound infection   R Recommendations: See Admitting Provider Note  Report given to:   Additional Notes: Pt is Covid  Positive

## 2023-06-27 NOTE — H&P (Cosign Needed Addendum)
Date: 06/28/2023               Patient Name:  Louis Juarez. MRN: [id number]  DOB: [birth year] Age / Sex: 82 y.o., male   PCP: Corrington, Meredith Mody, MD         Medical Service: Internal Medicine Teaching Service         Attending Physician: Dr. Mercie Eon, MD      First Contact: Dr. Laretta Bolster, MD Pager [phone]    Second Contact: Dr. Rana Snare, DO Pager [phone]         After Hours (After 5p/  First Contact Pager: [phone]  weekends / holidays): Second Contact Pager: [phone]   SUBJECTIVE   Chief Complaint: Acute Mental changes.  History of Present Illness:  Louis Juarez. is a 82 y.o. male with past history of dilated cardiomyopathy, AICD, hypertension, hyperlipidemia, diabetes presents to the emergency department evaluation of acute mental status changes. Majority of the history was provided by family who were at the bedside.   Of note, patient was recently hospitalized in October after a fall.  He sustained multiple pelvic fractures and open olecranon fracture on the right, ORIF with Ortho.  He has residual weakness on the right, and has been working with PT.  Family states that on Thursday, 12/5,  he did slide off the bed while urinating landing on the right shoulder and elbow.  They went to urgent care for imaging which was negative and discharged home. He has developed a drainage from right elbow, and family spoke to Dr. Jena Gauss, who prescribed Bactrim.  This morning, he was noted to be somnolent, and difficult to arouse by his son, when he tried to wake him up at home.  At first, it was thought he needed more sleep, but after 2 hours, he was still somnolent. Family denies any fevers, cough or any sick contact.  No new weakness, dysuria or increased urinary frequency.  ED Course: Both CBC and BMP normal. Normal UA. COVID-positive  Imaging Checks x-ray negative for acute pathology.  CT of the head without contrast,  CT cervical spine without contrast    Past Medical History -AICD  (interrogated today, no recent acute events) -Colon cancer -recent hip fracture  -HTN -HLD -CHF  Meds:  Current Meds  Medication Sig   acetaminophen (TYLENOL) 325 MG tablet Take 2 tablets (650 mg total) by mouth every 6 (six) hours.   albuterol (VENTOLIN HFA) 108 (90 Base) MCG/ACT inhaler Inhale 2 puffs into the lungs every 6 (six) hours as needed for wheezing or shortness of breath.   apixaban (ELIQUIS) 2.5 MG TABS tablet Take 1 tablet (2.5 mg total) by mouth 2 (two) times daily.   ascorbic acid (VITAMIN C) 500 MG tablet Take 1 tablet (500 mg total) by mouth daily.   carvedilol (COREG) 25 MG tablet Take 1 tablet (25 mg total) by mouth 2 (two) times daily with a meal.   ezetimibe (ZETIA) 10 MG tablet Take 1 tablet (10 mg total) by mouth at bedtime.   furosemide (LASIX) 20 MG tablet Take 1 tablet (20 mg total) by mouth 2 (two) times daily.   gabapentin (NEURONTIN) 300 MG capsule Take 1 capsule (300 mg total) by mouth 3 (three) times daily.   levocetirizine (XYZAL) 5 MG tablet SMARTSIG:1 Tablet(s) By Mouth Every Evening   LORazepam (ATIVAN) 1 MG tablet Take 1 tablet (1 mg total) by mouth at bedtime.   methocarbamol (ROBAXIN) 750 MG tablet Take 1 tablet (  750 mg total) by mouth every 8 (eight) hours.   Multiple Vitamin (MULTIVITAMIN) capsule Take 1 capsule by mouth daily.    omeprazole (PRILOSEC) 40 MG capsule Take 40 mg by mouth every morning.   Oxycodone HCl 10 MG TABS Take 1 tablet (10 mg total) by mouth every 4 (four) hours as needed (pain level 7 or higher).   pantoprazole (PROTONIX) 40 MG tablet Take 1 tablet (40 mg total) by mouth daily.   potassium chloride SA (KLOR-CON M) 20 MEQ tablet Take 1 tablet (20 mEq total) by mouth 2 (two) times daily.   pravastatin (PRAVACHOL) 80 MG tablet Take 80 mg by mouth at bedtime.    sertraline (ZOLOFT) 25 MG tablet Take 1 tablet (25 mg total) by mouth daily.   spironolactone (ALDACTONE) 25 MG tablet Take 1 tablet (25  mg total) by mouth daily.   sulfamethoxazole-trimethoprim (BACTRIM DS) 800-160 MG tablet Take 1 tablet by mouth 2 (two) times daily.   tamsulosin (FLOMAX) 0.4 MG CAPS capsule Take 1 capsule (0.4 mg total) by mouth daily after supper.   traZODone (DESYREL) 50 MG tablet Take 50 mg by mouth at bedtime.   vitamin D3 (CHOLECALCIFEROL) 25 MCG tablet Take 2 tablets (2,000 Units total) by mouth daily.   Zinc Sulfate 220 (50 Zn) MG TABS Take 1 tablet (220 mg total) by mouth daily after supper.    Past Surgical History Nasal sinus surgery ORIF pelvic fracture Irrigation and debridement of the elbow. Biventricular implantable cardioverter.  Social:  Lives with wife in [place] Support: wife and son Level of Function: dependent with ADLs  PCP: Corrington, Kip A, MD Substances: -Tobacco: former, quit 15 years ago -Alcohol: rarely -Recreational Drug: THC  Family History:   Family History  Problem Relation Age of Onset   Colon cancer Father    Melanoma Mother    Melanoma Sister    Allergies: Allergies as of 06/27/2023 - Review Complete 06/27/2023  Allergen Reaction Noted   Morphine Other (See Comments) 05/10/2023    Review of Systems: A complete ROS was negative except as per HPI.   OBJECTIVE:   Physical Exam:  Blood pressure 111/65, pulse 63, temperature 98 F (36.7 C), temperature source Rectal, resp. rate 16, SpO2 96%.   Constitutional: Somnolent, but arouses to voice, makes transient eye contact. Eyes: Normal conjunctiva. Cardio:Regular rate and rhythm.  No murmurs or gallops.   Pulm:Clear to auscultation bilaterally. Normal work of breathing on room air. Abdomen: Soft, non-tender, non-distended, positive bowel sounds. BMW:[id number] for extremity edema. Skin:Warm and dry.  Mild swelling erythema to the right elbow with scant clear discharge from the surgical site. Neuro: Opens eyes to voice, able to make eye contact , able to mention name.  Able to squeeze hands  bilaterally  Labs: CBC    Component Value Date/Time   WBC 7.9 06/27/2023 1050   RBC 4.25 06/27/2023 1050   HGB 13.9 06/27/2023 1100   HGB 13.9 06/27/2023 1100   HGB 14.9 04/29/2019 0809   HCT 41.0 06/27/2023 1100   HCT 41.0 06/27/2023 1100   HCT 43.4 04/29/2019 0809   PLT 224 06/27/2023 1050   PLT 227 04/29/2019 0809   MCV 98.6 06/27/2023 1050   MCV 93 04/29/2019 0809   MCH 31.3 06/27/2023 1050   MCHC 31.7 06/27/2023 1050   RDW 14.6 06/27/2023 1050   RDW 12.6 04/29/2019 0809   LYMPHSABS 1.7 06/27/2023 1050   LYMPHSABS 2.6 04/29/2019 0809   MONOABS 0.8 06/27/2023 1050   EOSABS  0.5 06/27/2023 1050   EOSABS 0.2 04/29/2019 0809   BASOSABS 0.0 06/27/2023 1050   BASOSABS 0.1 04/29/2019 0809     CMP     Component Value Date/Time   NA 142 06/27/2023 1100   NA 142 06/27/2023 1100   NA 137 04/29/2019 0809   K 4.2 06/27/2023 1100   K 4.2 06/27/2023 1100   CL 103 06/27/2023 1100   CO2 28 06/27/2023 1050   GLUCOSE 107 (H) 06/27/2023 1100   BUN 20 06/27/2023 1100   BUN 14 04/29/2019 0809   CREATININE 1.10 06/27/2023 1100   CALCIUM 9.5 06/27/2023 1050   PROT 7.3 06/27/2023 1050   ALBUMIN 2.8 (L) 06/27/2023 1050   AST 11 (L) 06/27/2023 1050   ALT 12 06/27/2023 1050   ALKPHOS 116 06/27/2023 1050   BILITOT 0.5 06/27/2023 1050   GFRNONAA >60 06/27/2023 1050   GFRAA 92 04/29/2019 0809    Imaging:  CT Head Wo Contrast  Result Date: 06/27/2023 CLINICAL DATA:  Altered mental status, head and neck pain due to trauma. EXAM: CT HEAD WITHOUT CONTRAST CT CERVICAL SPINE WITHOUT CONTRAST TECHNIQUE: Multidetector CT imaging of the head and cervical spine was performed following the standard protocol without intravenous contrast. Multiplanar CT image reconstructions of the cervical spine were also generated. RADIATION DOSE REDUCTION: This exam was performed according to the departmental dose-optimization program which includes automated exposure control, adjustment of the mA and/or kV  according to patient size and/or use of iterative reconstruction technique. COMPARISON:  CT head and cervical spine dated 04/24/2023. FINDINGS: Motion artifact limits the sensitivity of the exam. CT HEAD FINDINGS Brain: No evidence of acute infarction, hemorrhage, hydrocephalus, extra-axial collection or mass lesion/mass effect. There is mild cerebral volume loss with associated ex vacuo dilatation. Periventricular white matter hypoattenuation likely represents chronic small vessel ischemic disease. 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 Vascular: There are vascular calcifications in the carotid siphons. Skull: Normal. Negative for fracture or focal lesion. Sinuses/Orbits: Bilateral ethmoid sinus disease. Other: None. CT CERVICAL SPINE FINDINGS Alignment: Normal. Skull base and vertebrae: No acute fracture. No primary bone lesion or focal pathologic process. Soft tissues and spinal canal: No prevertebral fluid or swelling. No visible canal hematoma. Disc levels: Up to severe multilevel degenerative disc and joint disease which results in moderate neuroforaminal and mild central canal stenosis. Upper chest: Negative. Other: Atherosclerotic calcifications are seen in the carotid arteries. IMPRESSION: 1. No acute intracranial abnormality. 2. No acute fracture or traumatic subluxation of the cervical spine. 3. Bilateral ethmoid sinus disease. Electronically Signed   By: Romona Curls M.D.   On: 06/27/2023 11:57   CT Cervical Spine Wo Contrast  Result Date: 06/27/2023 CLINICAL DATA:  Altered mental status, head and neck pain due to trauma. EXAM: CT HEAD WITHOUT CONTRAST CT CERVICAL SPINE WITHOUT CONTRAST TECHNIQUE: Multidetector CT imaging of the head and cervical spine was performed following the standard protocol without intravenous contrast. Multiplanar CT image reconstructions of the cervical spine were also generated. RADIATION DOSE REDUCTION: This exam was performed according to  the departmental dose-optimization program which includes automated exposure control, adjustment of the mA and/or kV according to patient size and/or use of iterative reconstruction technique. COMPARISON:  CT head and cervical spine dated 04/24/2023. FINDINGS: Motion artifact limits the sensitivity of the exam. CT HEAD FINDINGS Brain: No evidence of acute infarction, hemorrhage, hydrocephalus, extra-axial collection or mass lesion/mass effect. There is mild cerebral volume loss with associated ex vacuo dilatation. Periventricular white matter  hypoattenuation likely represents chronic small vessel ischemic disease. 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 Vascular: There are vascular calcifications in the carotid siphons. Skull: Normal. Negative for fracture or focal lesion. Sinuses/Orbits: Bilateral ethmoid sinus disease. Other: None. CT CERVICAL SPINE FINDINGS Alignment: Normal. Skull base and vertebrae: No acute fracture. No primary bone lesion or focal pathologic process. Soft tissues and spinal canal: No prevertebral fluid or swelling. No visible canal hematoma. Disc levels: Up to severe multilevel degenerative disc and joint disease which results in moderate neuroforaminal and mild central canal stenosis. Upper chest: Negative. Other: Atherosclerotic calcifications are seen in the carotid arteries. IMPRESSION: 1. No acute intracranial abnormality. 2. No acute fracture or traumatic subluxation of the cervical spine. 3. Bilateral ethmoid sinus disease. Electronically Signed   By: Romona Curls M.D.   On: 06/27/2023 11:57   DG Pelvis Portable  Result Date: 06/27/2023 CLINICAL DATA:  Multiple recent falls. EXAM: PORTABLE PELVIS 1-2 VIEWS COMPARISON:  05/25/2023 FINDINGS: Stable pelvic fixation hardware and symphysis pubis diastasis. No acute fracture or dislocation. Lower lumbar spine degenerative changes. IMPRESSION: No acute abnormality. Electronically Signed   By: Beckie Salts  M.D.   On: 06/27/2023 11:30   DG Chest Portable 1 View  Result Date: 06/27/2023 CLINICAL DATA:  Altered mental status.  Multiple recent falls. EXAM: PORTABLE CHEST 1 VIEW COMPARISON:  Earlier today. FINDINGS: Decreased inspiration. Normal-sized heart, magnified by the decreased inspiration and portable AP technique. Crowding of the pulmonary vasculature and lung markings by the poor inspiration with no significant change in mild chronic interstitial prominence. Stable left subclavian pacer and AICD leads. Interval patient rotation to the right. Thoracic spine degenerative changes. IMPRESSION: 1. Decreased inspiration with no significant change in mild chronic interstitial prominence. 2. No acute abnormality. Electronically Signed   By: Beckie Salts M.D.   On: 06/27/2023 11:28     EKG: personally reviewed my interpretation is Normal sinus rhythm, with ventricular premature complex.  No ST elevations or T wave inversions..   ASSESSMENT & PLAN:   Assessment & Plan by Problem: Principal Problem:   Acute metabolic encephalopathy Active Problems:   NICM (nonischemic cardiomyopathy) (HCC)   Acute hypoxic respiratory failure (HCC)  Louis Juarez. is a 82 y.o. male with past history of dilated cardiomyopathy, AICD, hypertension, hyperlipidemia, diabetes presents to the emergency department evaluation of acute mental status changes.    # Altered mental status # Acute metabolic encephalopathy   Pt presenting with acute mental status changes.  He is afebrile, normotensive, on 3 L of nasal cannula satting 99%. Normal BMP and CBC.  UA appropriately positive for benzodiazepines.  Possible causes of AMS includes toxic metabolites vs infections vs. intracranial pathology.  Normal head CT makes  intracranial pathology less likely. Altered mental status likely multifactorial from central acting medicines( trazodone,gabapentin, lorazepam and robaxin) and a positive COVID infection.    - NPO. - swallow  study - Holding central acting medicines. - Wean off oxygen as tolerated.  # Acute hypoxic respiratory failure # COVID infection  He is on 3 L of nasal cannula, satting 99%. Suspect AHRF 2/2 to COVID infection. Normal CXR.  - Airborne/contact precautions. - Start dexamethasone and remdesivir.  # Pelvic ring fracture s/p percutaneous fixation  Follows with orthopedics, Dr. Jena Gauss.  He has residual weakness on the right leg not able to move as much, otherwise tolerated outpatient physical therapy, improved strength on the left extremity.  Repeat imaging of the hip and spine shows  no new fractures.  -Monitor.  # Chronic systolic CHF/nonischemic cardiomyopathy. Has ICD. Last echocardiogram 02/2023, LV EF 50-55%.  No signs of volume overload on chest x-ray today.  Home regimen includes Coreg, Lasix, Pravachol and Zetia.  Will hold blood pressure meds given patient is NPO.  -Daily weights monitor for signs of volume overload. -Holding blood pressure meds.   Stable chronic medical conditions.   Hyperlipidemia -Statin 80 mg -Ezetimibe 10 mg.  GERD -Pantoprazole  Diet:  NPO VTE: Enoxaparin Code: DNR  Dispo: Admit patient to Inpatient with expected length of stay greater than 2 midnights.  Signed:  Laretta Bolster, M.D.  Internal Medicine Resident, PGY-1 [facility]  Pager: [phone] 6:53 AM, 06/28/2023   **Please contact the on call pager after 5 pm and on weekends at [phone].**

## 2023-06-27 NOTE — ED Notes (Signed)
Patient transported to CT 

## 2023-06-28 DIAGNOSIS — U071 COVID-19: Secondary | ICD-10-CM | POA: Diagnosis not present

## 2023-06-28 DIAGNOSIS — G9341 Metabolic encephalopathy: Secondary | ICD-10-CM

## 2023-06-28 DIAGNOSIS — J9601 Acute respiratory failure with hypoxia: Secondary | ICD-10-CM | POA: Insufficient documentation

## 2023-06-28 LAB — CBC
HCT: 39.4 % (ref 39.0–52.0)
Hemoglobin: 12.7 g/dL — ABNORMAL LOW (ref 13.0–17.0)
MCH: 30.9 pg (ref 26.0–34.0)
MCHC: 32.2 g/dL (ref 30.0–36.0)
MCV: 95.9 fL (ref 80.0–100.0)
Platelets: 261 10*3/uL (ref 150–400)
RBC: 4.11 MIL/uL — ABNORMAL LOW (ref 4.22–5.81)
RDW: 14.4 % (ref 11.5–15.5)
WBC: 5.8 10*3/uL (ref 4.0–10.5)
nRBC: 0 % (ref 0.0–0.2)

## 2023-06-28 LAB — BASIC METABOLIC PANEL
Anion gap: 11 (ref 5–15)
BUN: 15 mg/dL (ref 8–23)
CO2: 25 mmol/L (ref 22–32)
Calcium: 9.5 mg/dL (ref 8.9–10.3)
Chloride: 102 mmol/L (ref 98–111)
Creatinine, Ser: 0.71 mg/dL (ref 0.61–1.24)
GFR, Estimated: >60 mL/min (ref >60)
Glucose, Bld: 189 mg/dL — ABNORMAL HIGH (ref 70–99)
Potassium: 4.3 mmol/L (ref 3.5–5.1)
Sodium: 138 mmol/L (ref 135–145)

## 2023-06-28 LAB — MAGNESIUM: Magnesium: 1.8 mg/dL (ref 1.7–2.4)

## 2023-06-28 MED ORDER — PANTOPRAZOLE SODIUM 20 MG PO TBEC
20.0000 mg | DELAYED_RELEASE_TABLET | Freq: Every day | ORAL | Status: DC
  Administered 2023-06-29 – 2023-07-04 (×6): 20 mg via ORAL
  Filled 2023-06-28 (×6): qty 1

## 2023-06-28 MED ORDER — LIDOCAINE 5 % EX PTCH
1.0000 | MEDICATED_PATCH | Freq: Every day | CUTANEOUS | Status: DC
  Administered 2023-06-28 – 2023-07-04 (×6): 1 via TRANSDERMAL
  Filled 2023-06-28 (×7): qty 1

## 2023-06-28 MED ORDER — SERTRALINE HCL 50 MG PO TABS
25.0000 mg | ORAL_TABLET | Freq: Every day | ORAL | Status: DC
  Administered 2023-06-28 – 2023-07-04 (×7): 25 mg via ORAL
  Filled 2023-06-28 (×7): qty 1

## 2023-06-28 MED ORDER — SULFAMETHOXAZOLE-TRIMETHOPRIM 800-160 MG PO TABS
1.0000 | ORAL_TABLET | Freq: Two times a day (BID) | ORAL | Status: AC
  Administered 2023-06-28 – 2023-07-02 (×10): 1 via ORAL
  Filled 2023-06-28 (×11): qty 1

## 2023-06-28 MED ORDER — TAMSULOSIN HCL 0.4 MG PO CAPS
0.4000 mg | ORAL_CAPSULE | Freq: Every day | ORAL | Status: DC
  Administered 2023-06-28 – 2023-07-03 (×6): 0.4 mg via ORAL
  Filled 2023-06-28 (×7): qty 1

## 2023-06-28 MED ORDER — APIXABAN 2.5 MG PO TABS
2.5000 mg | ORAL_TABLET | Freq: Two times a day (BID) | ORAL | Status: DC
  Administered 2023-06-28 – 2023-07-04 (×13): 2.5 mg via ORAL
  Filled 2023-06-28 (×13): qty 1

## 2023-06-28 MED ORDER — MELATONIN 3 MG PO TABS
3.0000 mg | ORAL_TABLET | Freq: Every day | ORAL | Status: DC
  Administered 2023-06-28 (×2): 3 mg via ORAL
  Filled 2023-06-28 (×2): qty 1

## 2023-06-28 MED ORDER — EZETIMIBE 10 MG PO TABS
10.0000 mg | ORAL_TABLET | Freq: Every day | ORAL | Status: DC
  Administered 2023-06-29 – 2023-07-03 (×5): 10 mg via ORAL
  Filled 2023-06-28 (×7): qty 1

## 2023-06-28 MED ORDER — PRAVASTATIN SODIUM 40 MG PO TABS
80.0000 mg | ORAL_TABLET | Freq: Every day | ORAL | Status: DC
  Administered 2023-06-28 – 2023-07-03 (×6): 80 mg via ORAL
  Filled 2023-06-28 (×6): qty 2

## 2023-06-28 NOTE — Progress Notes (Signed)
Patient has been alert to self, place at times.  Pt has pulled off telemetry leads and oxygen multiple times.  Pt has intermittent wheezing, sats 94-97% on 4L [place], pt denies SOB.  Also noted frequent PVCs.  Pt has requested a sleep aid and for nursing to call wife, Andrey Campanile.  Notified MD of the above and called wife to update.  Wife coming to stay with patient.

## 2023-06-28 NOTE — Progress Notes (Signed)
HD#0 Subjective:  Overnight Events: No acute event overnight.  Patient was seen at the bedside this morning.  He is endorsing right hip pain over the lumbar area.  He denies cough or chest pain.  Patient is requesting that we give his wife a call to visit.   Objective:  Vital signs in last 24 hours: Vitals:   06/27/23 1944 06/27/23 2042 06/27/23 2303 06/28/23 0409  BP: 137/72 136/72 131/79 134/66  Pulse: 72 76 71 79  Resp: 20 20 18 20   Temp: 97.8 F (36.6 C) 98 F (36.7 C)  98.4 F (36.9 C)  TempSrc: Oral Oral  Oral  SpO2: 100% 97% 94% 90%   Supplemental O2: Room Air Sating 93%   Physical Exam:   General: Sitting comfortably, not in acute distress. CV: RRR. No m/r/g. No LE edema Pulmonary: Lungs CTAB. Normal effort. No wheezing or rales. MSK: Tenderness over paraspinal muscles of the right lumbar spine.  There were no vitals filed for this visit.   Intake/Output Summary (Last 24 hours) at 06/28/2023 1051 Last data filed at 06/28/2023 0411 Gross per 24 hour  Intake 840 ml  Output 1200 ml  Net -360 ml   Net IO Since Admission: -360 mL [06/28/23 1051]  No results for input(s): "GLUCAP" in the last 72 hours.   Pertinent Labs:    Latest Ref Rng & Units 06/28/2023    8:43 AM 06/27/2023   11:00 AM 06/27/2023   10:50 AM  CBC  WBC 4.0 - 10.5 K/uL 5.8   7.9   Hemoglobin 13.0 - 17.0 g/dL 78.2  95.6    21.3  08.6   Hematocrit 39.0 - 52.0 % 39.4  41.0    41.0  41.9   Platelets 150 - 400 K/uL 261   224        Latest Ref Rng & Units 06/28/2023    8:43 AM 06/27/2023   11:00 AM 06/27/2023   10:50 AM  CMP  Glucose 70 - 99 mg/dL 578  469  629   BUN 8 - 23 mg/dL 15  20  17    Creatinine 0.61 - 1.24 mg/dL 5.28  4.13  2.44   Sodium 135 - 145 mmol/L 138  142    142  141   Potassium 3.5 - 5.1 mmol/L 4.3  4.2    4.2  4.2   Chloride 98 - 111 mmol/L 102  103  102   CO2 22 - 32 mmol/L 25   28   Calcium 8.9 - 10.3 mg/dL 9.5   9.5   Total Protein 6.5 - 8.1 g/dL   7.3    Total Bilirubin <1.2 mg/dL   0.5   Alkaline Phos 38 - 126 U/L   116   AST 15 - 41 U/L   11   ALT 0 - 44 U/L   12     Imaging: CT Head Wo Contrast  Result Date: 06/27/2023 CLINICAL DATA:  Altered mental status, head and neck pain due to trauma. EXAM: CT HEAD WITHOUT CONTRAST CT CERVICAL SPINE WITHOUT CONTRAST TECHNIQUE: Multidetector CT imaging of the head and cervical spine was performed following the standard protocol without intravenous contrast. Multiplanar CT image reconstructions of the cervical spine were also generated. RADIATION DOSE REDUCTION: This exam was performed according to the departmental dose-optimization program which includes automated exposure control, adjustment of the mA and/or kV according to patient size and/or use of iterative reconstruction technique. COMPARISON:  CT head and cervical spine dated  04/24/2023. FINDINGS: Motion artifact limits the sensitivity of the exam. CT HEAD FINDINGS Brain: No evidence of acute infarction, hemorrhage, hydrocephalus, extra-axial collection or mass lesion/mass effect. There is mild cerebral volume loss with associated ex vacuo dilatation. Periventricular white matter hypoattenuation likely represents chronic small vessel ischemic disease. 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 Vascular: There are vascular calcifications in the carotid siphons. Skull: Normal. Negative for fracture or focal lesion. Sinuses/Orbits: Bilateral ethmoid sinus disease. Other: None. CT CERVICAL SPINE FINDINGS Alignment: Normal. Skull base and vertebrae: No acute fracture. No primary bone lesion or focal pathologic process. Soft tissues and spinal canal: No prevertebral fluid or swelling. No visible canal hematoma. Disc levels: Up to severe multilevel degenerative disc and joint disease which results in moderate neuroforaminal and mild central canal stenosis. Upper chest: Negative. Other: Atherosclerotic calcifications are seen in the carotid  arteries. IMPRESSION: 1. No acute intracranial abnormality. 2. No acute fracture or traumatic subluxation of the cervical spine. 3. Bilateral ethmoid sinus disease. Electronically Signed   By: Romona Curls M.D.   On: 06/27/2023 11:57   CT Cervical Spine Wo Contrast  Result Date: 06/27/2023 CLINICAL DATA:  Altered mental status, head and neck pain due to trauma. EXAM: CT HEAD WITHOUT CONTRAST CT CERVICAL SPINE WITHOUT CONTRAST TECHNIQUE: Multidetector CT imaging of the head and cervical spine was performed following the standard protocol without intravenous contrast. Multiplanar CT image reconstructions of the cervical spine were also generated. RADIATION DOSE REDUCTION: This exam was performed according to the departmental dose-optimization program which includes automated exposure control, adjustment of the mA and/or kV according to patient size and/or use of iterative reconstruction technique. COMPARISON:  CT head and cervical spine dated 04/24/2023. FINDINGS: Motion artifact limits the sensitivity of the exam. CT HEAD FINDINGS Brain: No evidence of acute infarction, hemorrhage, hydrocephalus, extra-axial collection or mass lesion/mass effect. There is mild cerebral volume loss with associated ex vacuo dilatation. Periventricular white matter hypoattenuation likely represents chronic small vessel ischemic disease. 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 Vascular: There are vascular calcifications in the carotid siphons. Skull: Normal. Negative for fracture or focal lesion. Sinuses/Orbits: Bilateral ethmoid sinus disease. Other: None. CT CERVICAL SPINE FINDINGS Alignment: Normal. Skull base and vertebrae: No acute fracture. No primary bone lesion or focal pathologic process. Soft tissues and spinal canal: No prevertebral fluid or swelling. No visible canal hematoma. Disc levels: Up to severe multilevel degenerative disc and joint disease which results in moderate neuroforaminal and  mild central canal stenosis. Upper chest: Negative. Other: Atherosclerotic calcifications are seen in the carotid arteries. IMPRESSION: 1. No acute intracranial abnormality. 2. No acute fracture or traumatic subluxation of the cervical spine. 3. Bilateral ethmoid sinus disease. Electronically Signed   By: Romona Curls M.D.   On: 06/27/2023 11:57   DG Pelvis Portable  Result Date: 06/27/2023 CLINICAL DATA:  Multiple recent falls. EXAM: PORTABLE PELVIS 1-2 VIEWS COMPARISON:  05/25/2023 FINDINGS: Stable pelvic fixation hardware and symphysis pubis diastasis. No acute fracture or dislocation. Lower lumbar spine degenerative changes. IMPRESSION: No acute abnormality. Electronically Signed   By: Beckie Salts M.D.   On: 06/27/2023 11:30   DG Chest Portable 1 View  Result Date: 06/27/2023 CLINICAL DATA:  Altered mental status.  Multiple recent falls. EXAM: PORTABLE CHEST 1 VIEW COMPARISON:  Earlier today. FINDINGS: Decreased inspiration. Normal-sized heart, magnified by the decreased inspiration and portable AP technique. Crowding of the pulmonary vasculature and lung markings by the poor inspiration with no  significant change in mild chronic interstitial prominence. Stable left subclavian pacer and AICD leads. Interval patient rotation to the right. Thoracic spine degenerative changes. IMPRESSION: 1. Decreased inspiration with no significant change in mild chronic interstitial prominence. 2. No acute abnormality. Electronically Signed   By: Beckie Salts M.D.   On: 06/27/2023 11:28    Assessment/Plan:   Principal Problem:   Acute metabolic encephalopathy Active Problems:   NICM (nonischemic cardiomyopathy) (HCC)   Acute hypoxic respiratory failure (HCC)   COVID-19   Patient Summary: Louis Juarez. is a 82 y.o. male with past history of dilated cardiomyopathy, AICD, hypertension, hyperlipidemia, diabetes presents to the ED evaluation of acute mental status changes, and admitted for acute metabolic  encephalopathy.  # Altered mental status # Acute metabolic encephalopathy  Patient is alert and awake this morning.  He is afebrile, satting 92% on room air.  Mental status has improved after we held all central acting medicines. He also received treatment for COVID infection with Decadron and remdesivir.  Will restart patient back on the sertraline to prevent withdrawal, otherwise we will continue holding all his other central acting meds ( trazodone, gabapentin, lorazepam and Robaxin.)  - Continue normal diet. - Continue sertraline 25 mg - Holding all other central acting medicines. - PT/OT.  # Acute hypoxic respiratory failure # COVID infection Improved, satting on room air as above.  Would continue with 1 more day of Decadron and remdesivir.  - Airborne/contact precautions. - Wean off oxygen as tolerated -Tentative plan to discontinue Decadron/remdesivir if patient continues to be stable on room air.   # Pelvic ring fracture s/p percutaneous fixation  Follows with orthopedics, Louis Juarez.  He has residual weakness on the right leg not able to move as much, otherwise tolerated outpatient physical therapy, improved strength on the left extremity.  Repeat imaging of the hip and spine shows no new fractures.   -Monitor.   Right hip pain. New onset of right hip pain.  On exam, there is tenderness over the paraspinal muscles of the right lumbar spine, suspect MSK etiology.  -Lidocaine patch.  # Chronic systolic CHF/nonischemic cardiomyopathy. Has ICD. Last echocardiogram 02/2023, LV EF 50-55%.  No signs of volume overload on chest x-ray today.  Home regimen includes Coreg, Lasix, Pravachol and Zetia.  Will hold blood pressure meds given patient is NPO.   -Daily weights monitor for signs of volume overload. -Holding blood pressure meds.    Stable chronic medical conditions.   Hyperlipidemia -Statin 80 mg -Ezetimibe 10 mg.   GERD -Pantoprazole   Diet: Regular diet VTE:  apixaban (ELIQUIS) tablet 2.5 mg Start: 06/28/23 1000apixaban (ELIQUIS) tablet 2.5 mg  Code: DNR PT/OT: Pending ID:  Anti-infectives (From admission, onward)    Start     Dose/Rate Route Frequency Ordered Stop   06/28/23 1000  remdesivir 100 mg in sodium chloride 0.9 % 100 mL IVPB       Placed in "Followed by" Linked Group   100 mg 200 mL/hr over 30 Minutes Intravenous Daily 06/27/23 1813 06/30/23 0959   06/28/23 1000  sulfamethoxazole-trimethoprim (BACTRIM DS) 800-160 MG per tablet 1 tablet        1 tablet Oral 2 times daily 06/28/23 0722 07/03/23 0959   06/27/23 1915  remdesivir 200 mg in sodium chloride 0.9% 250 mL IVPB       Placed in "Followed by" Linked Group   200 mg 580 mL/hr over 30 Minutes Intravenous Once 06/27/23 1813 06/27/23 2106  Anticipated discharge to pending PT/OT recommendations.  Laretta Bolster, MD 06/28/2023, 10:51 AM Pager: [phone] [facility]  Please contact the on call pager after 5 pm and on weekends at [phone].

## 2023-06-28 NOTE — Hospital Course (Addendum)
Louis Juarez. is a 82 y.o. male with past history of dilated cardiomyopathy, AICD, hypertension, hyperlipidemia, diabetes presents with acute mental status changes, and admitted for acute metabolic encephalopathy.   # Acute metabolic encephalopathy, resolved On presentation, he was noted to be somnolent, but arouses to voice.  Was found to be COVID-positive. The cause of his altered mental status was thought to be from COVID infection combined with many central acting medicines.  We held all his central acting medicines, which improved his mentation, and also treated him for COVID infection.  Patient was back to his baseline after holding central acting meds for 12 to 24 hours. Patient and patient's family agreeable to inpatient rehab. His home sertraline was restarted on admission and home Ativan during hospital course at bedtime. Still holding home gabapentin, oxycodone, trazodone and Robaxin since patient has not been complaining of pain. Remains alert and oriented x3 at time of discharge.    # Acute hypoxic respiratory failure, resolved # COVID infection On presentation, he was on 3 L of nasal cannula to maintain Oxygen >92%.  AHRF secondary to COVID infection. Precautions were in place during hospital course. He got 2 doses of remdesivir, and decadron daily until discharge. Noted improvement in respiratory status and stable on room air.   # Pelvic ring fracture s/p percutaneous fixation  Follows with orthopedics, Dr. Jena Gauss.  He has residual weakness on the right leg not able to move as much, otherwise tolerated outpatient physical therapy, improved strength on the left extremity.  Repeat imaging of the hip and spine shows no new fractures. Was on Eliquis 2.5 mg BID for DVT prophylaxis postop x 30 days. No history of Afib or flutter requiring anticoagulation. Not on anticoagulation prior. Completed Eliquis course during this hospital course.    # Chronic systolic CHF/nonischemic  cardiomyopathy # HTN We held his blood pressure meds initially on presentation.  Resumed home spironolactone and Coreg during course.  Not volume overloaded during hospital stay, holding home Lasix which can be resumed at f/u after discharge.    Stable chronic medical conditions.   # HLD: Continued pravastatin 80 mg and Ezetimibe 10 mg. # MDD/Sleep: Continued home sertraline 25 mg daily and Ativan 1 mg at bedtime PRN.  # BPH: Continued home tamsulosin 0.4 mg at night.

## 2023-06-28 NOTE — Progress Notes (Signed)
Patient is calm and cooperative with wife at the bedside.

## 2023-06-28 NOTE — Evaluation (Signed)
Physical Therapy Evaluation Patient Details Name: Louis Juarez. MRN: [id number] DOB: [birth year] Today's Date: 06/28/2023  History of Present Illness  Pt is a 82 y.o. male admitted 06/27/23 with somnolence; workup for acute metabolic encepahlopathy, (+) COVID. Head CT negative for acute abnormality. Of note, recent ED visit 12/5 after fall and R elbow wound with negative workup. Other PMH includes AICD, HTN, DM, CHF, colon CA, fall (with admit 04/24/23 with R pelvic ORIF & R elbow I&D, d/c to [facility]; update to BLE NWB 05/20/23 due to shifted pelvic screw position).   Clinical Impression  Pt presents with an overall decrease in functional mobility secondary to above. PTA, pt d/c home from [facility] 05/2023 using slideboard and w/c for mobility; pt reports he pivots to w/c at home, has wife and son to assist with mobility and ADLs as needed. Today, pt requiring modA (+2 safety) for standing with RW, pt unable to maintain RLE NWB with scooting or standing despite cues. Pt would benefit from continued acute PT services to maximize functional mobility and independence prior to d/c with continued HHPT and family assist. If family unable to continue providing necessary assist, may need SNF-level therapies.    If plan is discharge home, recommend the following: A little help with walking and/or transfers;A lot of help with bathing/dressing/bathroom;Assistance with cooking/housework;Direct supervision/assist for financial management;Direct supervision/assist for medications management;Assist for transportation;Help with stairs or ramp for entrance   Can travel by private vehicle        Equipment Recommendations None recommended by PT  Recommendations for Other Services       Functional Status Assessment Patient has had a recent decline in their functional status and demonstrates the ability to make significant improvements in function in a reasonable and predictable amount of time.     Precautions /  Restrictions Precautions Precautions: Fall Restrictions Weight Bearing Restrictions: Yes Other Position/Activity Restrictions: was s/p R elbow I&D and R pelvic ORIF (05/08/23); [facility] notes say BLE NWB updated 05/20/23, but pt says RLE NWB and LLE WBAT for transfers only which is what he has been doing at home      Mobility  Bed Mobility Overal bed mobility: Needs Assistance Bed Mobility: Rolling, Sidelying to Sit Rolling: Supervision Sidelying to sit: Supervision Supine to sit: HOB elevated, Used rails     General bed mobility comments: increased time requiring cues to complete task; pt rolling self onto L side and sitting up without assist    Transfers Overall transfer level: Needs assistance Equipment used: Rolling walker (2 wheels) Transfers: Sit to/from Stand Sit to Stand: +2 safety/equipment, Min assist, Mod assist           General transfer comment: min-modA for trunk elevation and stability with sit>stand from EOB and pivot to recliner with RW; pt not keeping LE WB precautions despite cues    Ambulation/Gait               General Gait Details: NT as pt unable to keep RLE NWB precautions with standing  Stairs            Wheelchair Mobility     Tilt Bed    Modified Rankin (Stroke Patients Only)       Balance Overall balance assessment: Needs assistance Sitting-balance support: No upper extremity supported, Feet supported Sitting balance-Leahy Scale: Fair Sitting balance - Comments: prolonged static sitting at EOB with supervision   Standing balance support: Bilateral upper extremity supported, During functional activity, Reliant on assistive device for balance  Standing balance-Leahy Scale: Poor                               Pertinent Vitals/Pain Pain Assessment Pain Assessment: No/denies pain    Home Living Family/patient expects to be discharged to:: Private residence Living Arrangements: Spouse/significant other Available  Help at Discharge: Family;Available 24 hours/day Type of Home: House Home Access: Stairs to enter Entrance Stairs-Rails: Can reach both Entrance Stairs-Number of Steps: 3   Home Layout: Able to live on main level with bedroom/bathroom Home Equipment: Wheelchair - manual (slideboard, walker?) Additional Comments: pt unreliable historian. wife and son have been providing assist since d/c hom from [facility]    Prior Function Prior Level of Function : Independent/Modified Independent;Driving             Mobility Comments: pt typically independent without DME. upon d/c home from [facility] 05/31/23, slide board transfers with minA due to BLE WB precautions. pt states at home he has been using w/c, also states he has been walking up/down stairs ADLs Comments: prior to initial admission, pt indep with ADL/iADLs, drives. upon d/c from [facility] 05/31/23, pt mod indep with UB ADLs, mod-maxA for LB ADLs     Extremity/Trunk Assessment   Upper Extremity Assessment Upper Extremity Assessment: Generalized weakness RUE Deficits / Details: generalized weakness, ROM WFL; mildly decreased fine motor coordination RUE Sensation: WNL RUE Coordination: decreased fine motor LUE Deficits / Details: Overall strength and ROM WFL; decreased fine motor coordination LUE Sensation: WNL LUE Coordination: decreased fine motor    Lower Extremity Assessment Lower Extremity Assessment: RLE deficits/detail;LLE deficits/detail RLE Deficits / Details: 1/5 ankle DF noted (pt reports this has been since fall); gross hip and knee strength >/ 3/5 LLE Deficits / Details: gross LLE strength > 3/5    Cervical / Trunk Assessment Cervical / Trunk Assessment: Kyphotic  Communication   Communication Communication: Hearing impairment Cueing Techniques: Verbal cues;Gestural cues  Cognition Arousal: Alert Behavior During Therapy: WFL for tasks assessed/performed Overall Cognitive Status: No family/caregiver present to determine baseline  cognitive functioning Area of Impairment: Orientation, Attention, Memory, Following commands, Safety/judgement, Awareness, Problem solving                 Orientation Level: Disoriented to, Situation, Time Current Attention Level: Sustained, Selective Memory: Decreased recall of precautions, Decreased short-term memory Following Commands: Follows one step commands consistently, Follows one step commands with increased time Safety/Judgement: Decreased awareness of safety, Decreased awareness of deficits Awareness: Emergent Problem Solving: Slow processing, Decreased initiation, Difficulty sequencing, Requires verbal cues General Comments: pt following simple commands with increased cues; decreased attention requiring redirection to task/conversation at times. poor awareness of WB precautions and unable to maintain with mobility        General Comments General comments (skin integrity, edema, etc.): educ re: POC, precautions, activity recommendations, importance of OOB mobility, pressure relief strategies to buttocks, discharge needs. pt with SOB after transfer, SpO2 96% on RA    Exercises     Assessment/Plan    PT Assessment Patient needs continued PT services  PT Problem List Decreased strength;Decreased activity tolerance;Decreased balance;Decreased mobility;Decreased cognition;Decreased knowledge of use of DME;Decreased safety awareness;Decreased knowledge of precautions       PT Treatment Interventions DME instruction;Gait training;Stair training;Functional mobility training;Therapeutic activities;Therapeutic exercise;Balance training;Cognitive remediation;Patient/family education;Wheelchair mobility training    PT Goals (Current goals can be found in the Care Plan section)  Acute Rehab PT Goals Patient Stated Goal: return home  PT Goal Formulation: With patient Time For Goal Achievement: 07/12/23 Potential to Achieve Goals: Good    Frequency Min 1X/week      Co-evaluation PT/OT/SLP Co-Evaluation/Treatment: Yes Reason for Co-Treatment: Necessary to address cognition/behavior during functional activity;For patient/therapist safety;To address functional/ADL transfers PT goals addressed during session: Mobility/safety with mobility;Balance;Proper use of DME OT goals addressed during session: ADL's and self-care       AM-PAC PT "6 Clicks" Mobility  Outcome Measure Help needed turning from your back to your side while in a flat bed without using bedrails?: None Help needed moving from lying on your back to sitting on the side of a flat bed without using bedrails?: A Little Help needed moving to and from a bed to a chair (including a wheelchair)?: A Lot Help needed standing up from a chair using your arms (e.g., wheelchair or bedside chair)?: A Lot Help needed to walk in hospital room?: Total Help needed climbing 3-5 steps with a railing? : Total 6 Click Score: 13    End of Session Equipment Utilized During Treatment: Gait belt Activity Tolerance: Patient tolerated treatment well Patient left: in chair;with call bell/phone within reach;with chair alarm set Nurse Communication: Mobility status PT Visit Diagnosis: Other abnormalities of gait and mobility (R26.89);Muscle weakness (generalized) (M62.81)    Time: 1610-9604 PT Time Calculation (min) (ACUTE ONLY): 30 min   Charges:   PT Evaluation $PT Eval Moderate Complexity: 1 Mod   PT General Charges $$ ACUTE PT VISIT: 1 Visit    Ina Homes, PT, DPT Acute Rehabilitation Services  Personal: Secure Chat Rehab Office: [phone]  Malachy Chamber 06/28/2023, 3:47 PM

## 2023-06-28 NOTE — Evaluation (Signed)
Occupational Therapy Evaluation Patient Details Name: Louis Juarez. MRN: [id number] DOB: 06-22-1941 Today's Date: 06/28/2023   History of Present Illness Pt is a 82 y.o. male admitted 06/27/23 with somnolence; workup for acute metabolic encepahlopathy, (+) COVID. Head CT negative for acute abnormality. Of note, recent ED visit 12/5 after fall and R elbow wound with negative workup. Other PMH includes AICD, HTN, DM, CHF, colon CA, fall (with admit 04/24/23 with R pelvic ORIF & R elbow I&D, d/c to [facility]; update to BLE NWB 05/20/23 due to shifted pelvic screw position).   Clinical Impression   At baseline, pt is Independent with ADLs, IADLs, and functional mobility. After fall in 04/2023, pt received skilled rehab in [facility]. Pt d/c home from [facility] 05/2023 completing UB ADLs Mod I, requiring Mod to Max assist with LB ADLs, and using slideboard for functional transfers and w/c for mobility. Pt's wife and son assist with mobility and ADLs as needed and assist with IADLs. Pt now presents with decreased R UE strength, decreased B UE fine motor coordination, decreased cognition, decreased activity tolerance, decreased balance during functional tasks, and decreased safety and independence with functional tasks.. Pt currently demonstrates ability to complete UB ADLs with Set up to Min assist, LB ADLs with Max to Total assist +2, and functional transfers with a RW with Mod-Max assist +2. Pt currently requires Max cues for maintaining WB status, safety, and sequencing. Pt will benefit from acute skilled OT services to address deficits outlined below and to increase safety and independence with ADLs, functional transfers, and functional mobility. Post acute discharge, pt will benefit from continued skilled OT services in the home to maximize rehab potential paired with 24/ assist/supervision from family. If family unable to continue providing necessary assist, pt may need intensive inpatient < 3 hours per day level  therapies.       If plan is discharge home, recommend the following: Two people to help with walking and/or transfers;Two people to help with bathing/dressing/bathroom;Assistance with cooking/housework;Assistance with feeding;Direct supervision/assist for medications management;Direct supervision/assist for financial management;Assist for transportation;Help with stairs or ramp for entrance;Supervision due to cognitive status    Functional Status Assessment  Patient has had a recent decline in their functional status and demonstrates the ability to make significant improvements in function in a reasonable and predictable amount of time.  Equipment Recommendations  Tub/shower seat;BSC/3in1    Recommendations for Other Services       Precautions / Restrictions Precautions Precautions: Fall Restrictions Weight Bearing Restrictions: Yes Other Position/Activity Restrictions: was s/p R elbow I&D and R pelvic ORIF (05/08/23); [facility] notes say BLE NWB updated 05/20/23, but pt says RLE NWB and LLE WBAT for transfers only which is what he has been doing at home      Mobility Bed Mobility Overal bed mobility: Needs Assistance Bed Mobility: Rolling, Sidelying to Sit Rolling: Supervision Sidelying to sit: Contact guard assist, Min assist, HOB elevated, Used rails Supine to sit: Contact guard, Min assist, HOB elevated, Used rails     General bed mobility comments: with increased time and effort    Transfers Overall transfer level: Needs assistance Equipment used: Rolling walker (2 wheels) Transfers: Sit to/from Stand, Bed to chair/wheelchair/BSC Sit to Stand: Min assist, Mod assist, +2 safety/equipment     Step pivot transfers: Min assist, Mod assist, +2 safety/equipment     General transfer comment: requires cues for following precautions, hand placement/technique, safety, and sequencing; pt frequently breaking WB precautions even with cues  Balance Overall balance assessment:  Needs assistance Sitting-balance support: Single extremity supported, No upper extremity supported, Feet supported Sitting balance-Leahy Scale: Fair Sitting balance - Comments: prolonged static sitting at EOB with supervision Postural control: Left lateral lean (able to self-correct with verbal cues) Standing balance support: Bilateral upper extremity supported, During functional activity, Reliant on assistive device for balance Standing balance-Leahy Scale: Poor Standing balance comment: cues needed to follow WB precautions                           ADL either performed or assessed with clinical judgement   ADL Overall ADL's : Needs assistance/impaired Eating/Feeding: Supervision/ safety;Set up;Sitting   Grooming: Contact guard assist;Sitting   Upper Body Bathing: Minimal assistance;Cueing for sequencing;Cueing for compensatory techniques;Sitting   Lower Body Bathing: Maximal assistance;+2 for safety/equipment;Sit to/from stand;Sitting/lateral leans;Cueing for compensatory techniques;Cueing for sequencing   Upper Body Dressing : Minimal assistance;Sitting;Cueing for sequencing   Lower Body Dressing: Maximal assistance;+2 for safety/equipment;Cueing for safety;Cueing for sequencing;Cueing for compensatory techniques;Sit to/from stand;Sitting/lateral leans   Toilet Transfer: Minimal assistance;Moderate assistance;+2 for safety/equipment;Rolling walker (2 wheels);BSC/3in1;Cueing for safety;Cueing for sequencing (step-pivot transfer)   Toileting- Clothing Manipulation and Hygiene: Total assistance;+2 for safety/equipment;Sit to/from stand         General ADL Comments: Pt with decreased activity tolerance and feeling SOB with functional transfer with O2 sat remaining >/96% on RA. Pt requiring cues for safety, sequencing, and WB precautions throughout session.     Vision Baseline Vision/History: 1 Wears glasses (readers - per chart review) Ability to See in Adequate Light:  0 Adequate Patient Visual Report: No change from baseline       Perception         Praxis         Pertinent Vitals/Pain Pain Assessment Pain Assessment: No/denies pain     Extremity/Trunk Assessment Upper Extremity Assessment Upper Extremity Assessment: Generalized weakness;RUE deficits/detail;LUE deficits/detail (Pt initially stating he is Left handed and then stating he is Right handed.) RUE Deficits / Details: generalized weakness, ROM WFL; mildly decreased fine motor coordination RUE Sensation: WNL RUE Coordination: decreased fine motor LUE Deficits / Details: Overall strength and ROM WFL; decreased fine motor coordination LUE Sensation: WNL LUE Coordination: decreased fine motor   Lower Extremity Assessment Lower Extremity Assessment: Defer to PT evaluation RLE Deficits / Details: 1/5 ankle DF noted (pt reports this has been since fall); gross hip and knee strength >/ 3/5 LLE Deficits / Details: gross LLE strength > 3/5   Cervical / Trunk Assessment Cervical / Trunk Assessment: Kyphotic   Communication Communication Communication: Hearing impairment   Cognition Arousal: Alert Behavior During Therapy: WFL for tasks assessed/performed Overall Cognitive Status: Impaired/Different from baseline Area of Impairment: Orientation, Attention, Memory, Following commands, Safety/judgement, Awareness, Problem solving                 Orientation Level: Disoriented to, Situation, Time (Pt stating it is October 2024. Pt stating he is in the hospital due to fall that occured in October 2024.) Current Attention Level: Sustained Memory: Decreased recall of precautions, Decreased short-term memory Following Commands: Follows one step commands consistently, Follows one step commands with increased time Safety/Judgement: Decreased awareness of safety, Decreased awareness of deficits Awareness: Emergent Problem Solving: Slow processing, Decreased initiation, Difficulty  sequencing, Requires verbal cues General Comments: Pt stating R LE NWB and L LE WBAT but requiring constant cues to follow precautions during tasks with pt frequenting attempting to put full weight  through B LE. Pt reports he is unaware of any current or past R UE WB precautions. Pt pleasant and agreeable to participation throughout session.     General Comments  VSS on RA throughout session. RN entering room at end of session.    Exercises     Shoulder Instructions      Home Living Family/patient expects to be discharged to:: Private residence Living Arrangements: Spouse/significant other Available Help at Discharge: Family;Available 24 hours/day Type of Home: House Home Access: Stairs to enter [employer] of Steps: 3 Entrance Stairs-Rails: Can reach both Home Layout: Able to live on main level with bedroom/bathroom     Bathroom Shower/Tub: Producer, television/film/video: Standard Bathroom Accessibility: Yes How Accessible: Accessible via walker Home Equipment: Wheelchair - manual;Other (comment) (sliding board; unclear if pt has a RW)   Additional Comments: Pt is an unreliable reporter. Pt report this day does not entirely match chart review. Home Living information taken from chart review as pt stated this day he has12 steps to enter at the front door and 19 at the back door. Pt further reports he is using a w/c at home and reports he is climbing stairs with no issues.      Prior Functioning/Environment Prior Level of Function : Independent/Modified Independent;Driving             Mobility Comments: At baseline, pt is Independent without an AD. Per chart review, following fall and hospital admission in 04/2023, on 05/31/23, pt was completing sliding board transfers with Min assist. Pt unable to provide reliable report of funcitonal level just prior to this admission. ADLs Comments: At baseline, pt is Independent with ADLs, IADLs, and drives. Per chart review,  following fall and hospital admission in 04/2023, on 05/31/23, pt was completing UB ADLs with Mod I and LB ADLs with Mod to Max assist. Pt unable to provide reliable report of funcitonal level just prior to this admission, but states wife and son currently assist with ADLs and IADLs.        OT Problem List: Decreased strength;Decreased activity tolerance;Impaired balance (sitting and/or standing);Decreased coordination;Decreased cognition;Decreased safety awareness;Decreased knowledge of use of DME or AE;Decreased knowledge of precautions      OT Treatment/Interventions: Self-care/ADL training;Therapeutic exercise;DME and/or AE instruction;Therapeutic activities;Cognitive remediation/compensation;Patient/family education    OT Goals(Current goals can be found in the care plan section) Acute Rehab OT Goals Patient Stated Goal: to return home OT Goal Formulation: With patient Time For Goal Achievement: 07/12/23 Potential to Achieve Goals: Good ADL Goals Pt Will Perform Grooming: with set-up;sitting Pt Will Perform Lower Body Bathing: with min assist;sit to/from stand;sitting/lateral leans (adhering to WB precautions) Pt Will Perform Lower Body Dressing: with min assist;sitting/lateral leans;sit to/from stand (adhering to WB precautions) Pt Will Transfer to Toilet: with contact guard assist;ambulating;bedside commode (with least restrictive AD; adhering to WB precautions) Pt Will Perform Toileting - Clothing Manipulation and hygiene: with mod assist;sitting/lateral leans;sit to/from stand (adhering to WB precautions) Pt/caregiver will Perform Home Exercise Program: Both right and left upper extremity;Increased strength;With theraband;With minimal assist;With written HEP provided (Increased activity tolerance; Increased coordination)  OT Frequency: Min 1X/week    Co-evaluation PT/OT/SLP Co-Evaluation/Treatment: Yes Reason for Co-Treatment: Necessary to address cognition/behavior during  functional activity;For patient/therapist safety;To address functional/ADL transfers PT goals addressed during session: Mobility/safety with mobility;Balance;Proper use of DME OT goals addressed during session: ADL's and self-care      AM-PAC OT "6 Clicks" Daily Activity     Outcome Measure Help from  another person eating meals?: A Little Help from another person taking care of personal grooming?: A Little Help from another person toileting, which includes using toliet, bedpan, or urinal?: Total Help from another person bathing (including washing, rinsing, drying)?: A Lot Help from another person to put on and taking off regular upper body clothing?: A Little Help from another person to put on and taking off regular lower body clothing?: A Lot 6 Click Score: 14   End of Session Equipment Utilized During Treatment: Gait belt;Rolling walker (2 wheels) Nurse Communication: Mobility status  Activity Tolerance: Patient tolerated treatment well Patient left: in chair;with call bell/phone within reach;with chair alarm set;with nursing/sitter in room  OT Visit Diagnosis: Unsteadiness on feet (R26.81);Other abnormalities of gait and mobility (R26.89);Muscle weakness (generalized) (M62.81);Ataxia, unspecified (R27.0);History of falling (Z91.81);Other symptoms and signs involving cognitive function;Other (comment) (decreased activity toleracne)                Time: 2956-2130 OT Time Calculation (min): 31 min Charges:  OT General Charges $OT Visit: 1 Visit OT Evaluation $OT Eval Moderate Complexity: 1 Mod OT Treatments $Self Care/Home Management : 8-22 mins  Zakry Caso "Orson Eva., OTR/L, MA Acute Rehab [phone]   Lendon Colonel 06/28/2023, 6:18 PM

## 2023-06-29 ENCOUNTER — Inpatient Hospital Stay (HOSPITAL_COMMUNITY): Payer: BC Managed Care – PPO

## 2023-06-29 DIAGNOSIS — I420 Dilated cardiomyopathy: Secondary | ICD-10-CM | POA: Diagnosis present

## 2023-06-29 DIAGNOSIS — Z79899 Other long term (current) drug therapy: Secondary | ICD-10-CM | POA: Diagnosis not present

## 2023-06-29 DIAGNOSIS — K219 Gastro-esophageal reflux disease without esophagitis: Secondary | ICD-10-CM | POA: Diagnosis present

## 2023-06-29 DIAGNOSIS — R531 Weakness: Secondary | ICD-10-CM | POA: Diagnosis present

## 2023-06-29 DIAGNOSIS — E119 Type 2 diabetes mellitus without complications: Secondary | ICD-10-CM | POA: Diagnosis present

## 2023-06-29 DIAGNOSIS — G9340 Encephalopathy, unspecified: Secondary | ICD-10-CM | POA: Diagnosis present

## 2023-06-29 DIAGNOSIS — Z66 Do not resuscitate: Secondary | ICD-10-CM | POA: Diagnosis present

## 2023-06-29 DIAGNOSIS — D72829 Elevated white blood cell count, unspecified: Secondary | ICD-10-CM | POA: Diagnosis present

## 2023-06-29 DIAGNOSIS — W19XXXD Unspecified fall, subsequent encounter: Secondary | ICD-10-CM | POA: Diagnosis present

## 2023-06-29 DIAGNOSIS — Z8616 Personal history of COVID-19: Secondary | ICD-10-CM | POA: Diagnosis not present

## 2023-06-29 DIAGNOSIS — G9341 Metabolic encephalopathy: Secondary | ICD-10-CM | POA: Diagnosis present

## 2023-06-29 DIAGNOSIS — Z808 Family history of malignant neoplasm of other organs or systems: Secondary | ICD-10-CM | POA: Diagnosis not present

## 2023-06-29 DIAGNOSIS — U071 COVID-19: Secondary | ICD-10-CM | POA: Diagnosis present

## 2023-06-29 DIAGNOSIS — F329 Major depressive disorder, single episode, unspecified: Secondary | ICD-10-CM | POA: Diagnosis present

## 2023-06-29 DIAGNOSIS — E782 Mixed hyperlipidemia: Secondary | ICD-10-CM | POA: Diagnosis present

## 2023-06-29 DIAGNOSIS — Z85038 Personal history of other malignant neoplasm of large intestine: Secondary | ICD-10-CM | POA: Diagnosis not present

## 2023-06-29 DIAGNOSIS — I2510 Atherosclerotic heart disease of native coronary artery without angina pectoris: Secondary | ICD-10-CM | POA: Diagnosis present

## 2023-06-29 DIAGNOSIS — T380X5A Adverse effect of glucocorticoids and synthetic analogues, initial encounter: Secondary | ICD-10-CM | POA: Diagnosis present

## 2023-06-29 DIAGNOSIS — I110 Hypertensive heart disease with heart failure: Secondary | ICD-10-CM | POA: Diagnosis present

## 2023-06-29 DIAGNOSIS — Z885 Allergy status to narcotic agent status: Secondary | ICD-10-CM | POA: Diagnosis not present

## 2023-06-29 DIAGNOSIS — J9601 Acute respiratory failure with hypoxia: Secondary | ICD-10-CM | POA: Diagnosis present

## 2023-06-29 DIAGNOSIS — J1282 Pneumonia due to coronavirus disease 2019: Secondary | ICD-10-CM | POA: Diagnosis present

## 2023-06-29 DIAGNOSIS — Z85828 Personal history of other malignant neoplasm of skin: Secondary | ICD-10-CM | POA: Diagnosis not present

## 2023-06-29 DIAGNOSIS — S32810D Multiple fractures of pelvis with stable disruption of pelvic ring, subsequent encounter for fracture with routine healing: Secondary | ICD-10-CM | POA: Diagnosis not present

## 2023-06-29 DIAGNOSIS — Z95810 Presence of automatic (implantable) cardiac defibrillator: Secondary | ICD-10-CM | POA: Diagnosis not present

## 2023-06-29 DIAGNOSIS — M25571 Pain in right ankle and joints of right foot: Secondary | ICD-10-CM | POA: Diagnosis present

## 2023-06-29 DIAGNOSIS — I5022 Chronic systolic (congestive) heart failure: Secondary | ICD-10-CM | POA: Diagnosis present

## 2023-06-29 DIAGNOSIS — Z87891 Personal history of nicotine dependence: Secondary | ICD-10-CM | POA: Diagnosis not present

## 2023-06-29 DIAGNOSIS — E785 Hyperlipidemia, unspecified: Secondary | ICD-10-CM | POA: Diagnosis present

## 2023-06-29 DIAGNOSIS — R338 Other retention of urine: Secondary | ICD-10-CM | POA: Diagnosis present

## 2023-06-29 DIAGNOSIS — G4700 Insomnia, unspecified: Secondary | ICD-10-CM | POA: Diagnosis present

## 2023-06-29 DIAGNOSIS — N401 Enlarged prostate with lower urinary tract symptoms: Secondary | ICD-10-CM | POA: Diagnosis present

## 2023-06-29 DIAGNOSIS — Z7901 Long term (current) use of anticoagulants: Secondary | ICD-10-CM | POA: Diagnosis not present

## 2023-06-29 DIAGNOSIS — I5042 Chronic combined systolic (congestive) and diastolic (congestive) heart failure: Secondary | ICD-10-CM | POA: Diagnosis present

## 2023-06-29 LAB — CBC
HCT: 38.4 % — ABNORMAL LOW (ref 39.0–52.0)
Hemoglobin: 12.7 g/dL — ABNORMAL LOW (ref 13.0–17.0)
MCH: 31.1 pg (ref 26.0–34.0)
MCHC: 33.1 g/dL (ref 30.0–36.0)
MCV: 93.9 fL (ref 80.0–100.0)
Platelets: 263 10*3/uL (ref 150–400)
RBC: 4.09 MIL/uL — ABNORMAL LOW (ref 4.22–5.81)
RDW: 14.2 % (ref 11.5–15.5)
WBC: 10.7 10*3/uL — ABNORMAL HIGH (ref 4.0–10.5)
nRBC: 0 % (ref 0.0–0.2)

## 2023-06-29 LAB — BASIC METABOLIC PANEL
Anion gap: 10 (ref 5–15)
BUN: 19 mg/dL (ref 8–23)
CO2: 25 mmol/L (ref 22–32)
Calcium: 9.6 mg/dL (ref 8.9–10.3)
Chloride: 103 mmol/L (ref 98–111)
Creatinine, Ser: 0.97 mg/dL (ref 0.61–1.24)
GFR, Estimated: >60 mL/min (ref >60)
Glucose, Bld: 135 mg/dL — ABNORMAL HIGH (ref 70–99)
Potassium: 3.6 mmol/L (ref 3.5–5.1)
Sodium: 138 mmol/L (ref 135–145)

## 2023-06-29 MED ORDER — POTASSIUM CHLORIDE CRYS ER 20 MEQ PO TBCR
40.0000 meq | EXTENDED_RELEASE_TABLET | Freq: Once | ORAL | Status: AC
  Administered 2023-06-29: 40 meq via ORAL
  Filled 2023-06-29: qty 2

## 2023-06-29 MED ORDER — LORAZEPAM 1 MG PO TABS
1.0000 mg | ORAL_TABLET | Freq: Every evening | ORAL | Status: DC | PRN
  Administered 2023-06-29 – 2023-07-03 (×5): 1 mg via ORAL
  Filled 2023-06-29 (×5): qty 1

## 2023-06-29 MED ORDER — SPIRONOLACTONE 25 MG PO TABS
25.0000 mg | ORAL_TABLET | Freq: Every day | ORAL | Status: DC
  Administered 2023-06-29 – 2023-07-04 (×6): 25 mg via ORAL
  Filled 2023-06-29 (×6): qty 1

## 2023-06-29 MED ORDER — MELATONIN 5 MG PO TABS
5.0000 mg | ORAL_TABLET | Freq: Once | ORAL | Status: AC
  Administered 2023-06-29: 5 mg via ORAL
  Filled 2023-06-29: qty 1

## 2023-06-29 MED ORDER — CARVEDILOL 25 MG PO TABS
25.0000 mg | ORAL_TABLET | Freq: Two times a day (BID) | ORAL | Status: DC
  Administered 2023-06-29 – 2023-07-04 (×10): 25 mg via ORAL
  Filled 2023-06-29 (×10): qty 1

## 2023-06-29 NOTE — Progress Notes (Signed)
Inpatient Rehab Admissions Coordinator:  ? ?Per therapy recommendations,  patient was screened for CIR candidacy by Devaney Segers, MS, CCC-SLP. At this time, Pt. Appears to be a a potential candidate for CIR. I will place   order for rehab consult per protocol for full assessment. Please contact me any with questions. ? ?Trine Fread, MS, CCC-SLP ?Rehab Admissions Coordinator  ?[phone] (celll) ?[phone] (office) ? ?

## 2023-06-29 NOTE — Progress Notes (Signed)
Physical Therapy Treatment Patient Details Name: Louis Juarez. MRN: [id number] DOB: 1941-07-04 Today's Date: 06/29/2023   History of Present Illness Pt is a 82 y.o. male admitted 06/27/23 with somnolence; workup for acute metabolic encepahlopathy, (+) COVID. Head CT negative for acute abnormality. Of note, recent ED visit 12/5 after fall and R elbow wound with negative workup. Other PMH includes AICD, HTN, DM, CHF, colon CA, fall (with admit 04/24/23 with R pelvic ORIF & R elbow I&D, d/c to [facility]; Dr. Jena Gauss ok with WBAT LLE transfers only on 12/9, awaiting imaging for further updates on RLE)    PT Comments  Pt tolerates treatment well, performing squat pivot transfers from bed to chair. Dr. Jena Gauss is present during portion of session and confirms WBAT for transfers through LLE, NWB through RLE. Pt attempts standing with a RW, however he is unable to maintain NWB through RLE at this time. PT will continue to follow to improve transfer quality. Dr. Jena Gauss to re-order imaging to assess WB status. If pt is able to initiate WB through RLE the patient may be able to make considerable progress. Pt and family requesting inpatient rehab consult. Pt may be a candidate if WB status is upgraded, if status remains the same then HHPT likely remains appropriate. Pt and family report they are not willing to consider SNF.   If plan is discharge home, recommend the following: A little help with walking and/or transfers;A lot of help with bathing/dressing/bathroom;Assistance with cooking/housework;Direct supervision/assist for medications management;Direct supervision/assist for financial management;Assist for transportation;Help with stairs or ramp for entrance;Supervision due to cognitive status   Can travel by private vehicle        Equipment Recommendations  None recommended by PT    Recommendations for Other Services Rehab consult     Precautions / Restrictions Precautions Precautions:  Fall Restrictions Weight Bearing Restrictions: Yes RUE Weight Bearing: Weight bearing as tolerated RLE Weight Bearing: Non weight bearing LLE Weight Bearing: Weight bearing as tolerated (WBAT for transfers only)     Mobility  Bed Mobility Overal bed mobility: Needs Assistance Bed Mobility: Rolling, Supine to Sit, Sit to Supine Rolling: Contact guard assist   Supine to sit: Contact guard Sit to supine: Contact guard assist        Transfers Overall transfer level: Needs assistance Equipment used: Rolling walker (2 wheels), None Transfers: Sit to/from Stand, Bed to chair/wheelchair/BSC Sit to Stand: Min assist (pt with minimal WB through R heel during initiation of sit to stand despite PT cues for NWB)     Squat pivot transfers: Min assist     General transfer comment: pt with more success transferring to L side from bed to recliner, increased difficulty transferring to R side    Ambulation/Gait                   Stairs             Wheelchair Mobility     Tilt Bed    Modified Rankin (Stroke Patients Only)       Balance Overall balance assessment: Needs assistance Sitting-balance support: No upper extremity supported, Feet supported Sitting balance-Leahy Scale: Fair     Standing balance support: Bilateral upper extremity supported, Reliant on assistive device for balance Standing balance-Leahy Scale: Poor Standing balance comment: SLS on LLE for brief period, ~5-10 seconds  Cognition Arousal: Alert Behavior During Therapy: WFL for tasks assessed/performed Overall Cognitive Status: Impaired/Different from baseline Area of Impairment: Memory, Safety/judgement, Awareness                     Memory: Decreased short-term memory, Decreased recall of precautions   Safety/Judgement: Decreased awareness of safety, Decreased awareness of deficits Awareness: Emergent            Exercises       General Comments General comments (skin integrity, edema, etc.): VSS on RA, pt incontinent of stool, PT assists in hygiene tasks      Pertinent Vitals/Pain Pain Assessment Pain Assessment: Faces Faces Pain Scale: Hurts little more Pain Location: RLE during standing on LLE Pain Descriptors / Indicators: Aching Pain Intervention(s): Monitored during session    Home Living                          Prior Function            PT Goals (current goals can now be found in the care plan section) Acute Rehab PT Goals Patient Stated Goal: return to prior level of mobility Progress towards PT goals: Progressing toward goals    Frequency    Min 1X/week      PT Plan      Co-evaluation              AM-PAC PT "6 Clicks" Mobility   Outcome Measure  Help needed turning from your back to your side while in a flat bed without using bedrails?: A Little Help needed moving from lying on your back to sitting on the side of a flat bed without using bedrails?: A Little Help needed moving to and from a bed to a chair (including a wheelchair)?: A Little Help needed standing up from a chair using your arms (e.g., wheelchair or bedside chair)?: A Little Help needed to walk in hospital room?: Total Help needed climbing 3-5 steps with a railing? : Total 6 Click Score: 14    End of Session Equipment Utilized During Treatment: Gait belt Activity Tolerance: Patient tolerated treatment well Patient left: in bed;with bed alarm set;with call bell/phone within reach Nurse Communication: Mobility status PT Visit Diagnosis: Other abnormalities of gait and mobility (R26.89);Muscle weakness (generalized) (M62.81)     Time: 1610-9604 PT Time Calculation (min) (ACUTE ONLY): 32 min  Charges:    $Therapeutic Activity: 23-37 mins PT General Charges $$ ACUTE PT VISIT: 1 Visit                     Arlyss Gandy, PT, DPT Acute Rehabilitation Office [phone]    Arlyss Gandy 06/29/2023, 3:34 PM

## 2023-06-29 NOTE — Progress Notes (Signed)
Patient alert and oriented x 3-4 at beginning of shift that progressed to alert to self only at times.  Patient is now yelling repeatedly and wanting his sleep medication, ativan.  MD notified of patient request.

## 2023-06-29 NOTE — Progress Notes (Addendum)
HD#0 Subjective:  Overnight Events: No acute event overnight.  Patient was seen at the bedside.  Not endorsing shortness of breath or chest pain. He was seen by PT/OT yesterday, recommending home health if there is good family support versus SNF.  Objective:  Vital signs in last 24 hours: Vitals:   06/29/23 0338 06/29/23 0925 06/29/23 1149 06/29/23 1331  BP: (!) 146/77 (!) 155/82 (!) 140/75 (!) 148/67  Pulse: 71 65 69 65  Resp: 16  (!) 26 (!) 22  Temp: (!) 97.5 F (36.4 C)  97.6 F (36.4 C) (!) 97.4 F (36.3 C)  TempSrc: Oral  Oral Oral  SpO2: 94% 94% 94% 91%  Weight:   73.4 kg    Supplemental O2: Room Air Sating 93%   Physical Exam:   General: Sitting comfortably, not in acute distress. CV: RRR. No m/r/g. No LE edema Pulmonary: Lungs CTAB. Normal effort. No wheezing or rales. MSK: Tenderness over paraspinal muscles of the right lumbar spine.  Filed Weights   06/29/23 1149  Weight: 73.4 kg     Intake/Output Summary (Last 24 hours) at 06/29/2023 1340 Last data filed at 06/29/2023 1333 Gross per 24 hour  Intake 480 ml  Output 1450 ml  Net -970 ml   Net IO Since Admission: -1,330 mL [06/29/23 1340]  No results for input(s): "GLUCAP" in the last 72 hours.   Pertinent Labs:    Latest Ref Rng & Units 06/29/2023    4:03 AM 06/28/2023    8:43 AM 06/27/2023   11:00 AM  CBC  WBC 4.0 - 10.5 K/uL 10.7  5.8    Hemoglobin 13.0 - 17.0 g/dL 47.8  29.5  62.1    30.8   Hematocrit 39.0 - 52.0 % 38.4  39.4  41.0    41.0   Platelets 150 - 400 K/uL 263  261         Latest Ref Rng & Units 06/29/2023    4:03 AM 06/28/2023    8:43 AM 06/27/2023   11:00 AM  CMP  Glucose 70 - 99 mg/dL 657  846  962   BUN 8 - 23 mg/dL 19  15  20    Creatinine 0.61 - 1.24 mg/dL 9.52  8.41  3.24   Sodium 135 - 145 mmol/L 138  138  142    142   Potassium 3.5 - 5.1 mmol/L 3.6  4.3  4.2    4.2   Chloride 98 - 111 mmol/L 103  102  103   CO2 22 - 32 mmol/L 25  25    Calcium 8.9 - 10.3 mg/dL  9.6  9.5      Imaging: No results found.  Assessment/Plan:   Principal Problem:   Acute metabolic encephalopathy Active Problems:   NICM (nonischemic cardiomyopathy) (HCC)   Acute hypoxic respiratory failure (HCC)   COVID-19   Patient Summary: Louis Juarez. is a 82 y.o. male with past history of dilated cardiomyopathy, AICD, hypertension, hyperlipidemia, diabetes presents to the ED evaluation of acute mental status changes, and admitted for acute metabolic encephalopathy.  # Altered mental status # Acute metabolic encephalopathy  He is alert and oriented x 3,  he is afebrile satting on room air.  Leukocytosis secondary to steroid use.  Replete electrolytes. Will restart patient's lorazepam and gabapentin, to prevent any withdrawal from central acting drugs.  Will continue to hold Robaxin and oxycodone since has not been complaining of pain.  Patient was seen by PT/OT recommending  home health versus SNF.  I spoke to his wife, and family is in favor of discharge to SNF or inpatient rehab.  Social worker and PT following, appreciate their recommendations.  - Follow-up on PT/social worker recommendations. - Continue sertraline 25 mg - Continue lorazepam 1 mg at bedtime  - Continue holding oxycodone, gabapentin and Robaxin. - Holding all other central acting medicines. - PT/OT.  # Acute hypoxic respiratory failure # COVID infection Improved, satting on room air as above.  Will discontinue remdesivir/Decadron.  - Airborne/contact precautions. -Monitor.  # Chronic systolic CHF/nonischemic cardiomyopathy. Has been slightly hypertensive today high 140/85.  Will resume patient home dose of Coreg and spironolactone.  On exam today, he is euvolemic.  Will continue holding his home Lasix.   -Continue Coreg 25 mg. -Continue spironolactone 25 mg. -Holding Lasix.   # Pelvic ring fracture s/p percutaneous fixation  Follows with orthopedics, Dr. Jena Gauss, and had a scheduled follow-up  for today 12/9.  Patient's wife reached out to Dr. Jena Gauss, there is a tentative plan to get further imaging to assess how his pelvic fractures are doing.  -DG pelvis Comp Min 3V -Monitor.   Right hip pain. Stable, improved with a lidocaine patch.  Suspect MSK etiology.  -Lidocaine patch.   Stable chronic medical conditions.   Hyperlipidemia -Statin 80 mg -Ezetimibe 10 mg.   GERD -Pantoprazole   Diet: Regular diet VTE: apixaban (ELIQUIS) tablet 2.5 mg Start: 06/28/23 1000apixaban (ELIQUIS) tablet 2.5 mg  Code: DNR PT/OT: Pending ID:  Anti-infectives (From admission, onward)    Start     Dose/Rate Route Frequency Ordered Stop   06/28/23 1000  remdesivir 100 mg in sodium chloride 0.9 % 100 mL IVPB  Status:  Discontinued       Placed in "Followed by" Linked Group   100 mg 200 mL/hr over 30 Minutes Intravenous Daily 06/27/23 1813 06/29/23 0918   06/28/23 1000  sulfamethoxazole-trimethoprim (BACTRIM DS) 800-160 MG per tablet 1 tablet        1 tablet Oral 2 times daily 06/28/23 0722 07/03/23 0959   06/27/23 1915  remdesivir 200 mg in sodium chloride 0.9% 250 mL IVPB       Placed in "Followed by" Linked Group   200 mg 580 mL/hr over 30 Minutes Intravenous Once 06/27/23 1813 06/27/23 2106        Anticipated discharge to pending PT/OT recommendations.  Laretta Bolster, MD 06/29/2023, 1:40 PM Pager: [phone] [facility]  Please contact the on call pager after 5 pm and on weekends at [phone].

## 2023-06-29 NOTE — Progress Notes (Signed)
Transition of Care [facility]) - Inpatient Brief Assessment   Patient Details  Name: Louis Juarez. MRN: [id number] Date of Birth: 17-Jul-1941  [facility]) CM/SW Contact:    Ronny Bacon, RN Phone Number: 06/29/2023, 3:01 PM   Clinical Narrative:  Patient waiting PT/OT recommendations, dispo options HH, SNF, or inpatient rehab. Patient has Nurse, learning disability which may cause difficulty getting HH agency without high co-pay responsibility.   Transition of Care Asessment: Insurance and Status: (P) Insurance coverage has been reviewed Patient has primary care physician: (P) Yes Home environment has been reviewed: (P) House   Prior/Current Home Services: (P) No current home services Social Determinants of Health Reivew: (P) SDOH reviewed no interventions necessary Readmission risk has been reviewed: (P) Yes Transition of care needs: (P) transition of care needs identified, TOC will continue to follow (HH vs SNF vs Inpatient Rehab)

## 2023-06-30 DIAGNOSIS — U071 COVID-19: Secondary | ICD-10-CM | POA: Diagnosis not present

## 2023-06-30 DIAGNOSIS — G9341 Metabolic encephalopathy: Secondary | ICD-10-CM | POA: Diagnosis not present

## 2023-06-30 LAB — CBC
HCT: 36.2 % — ABNORMAL LOW (ref 39.0–52.0)
Hemoglobin: 12 g/dL — ABNORMAL LOW (ref 13.0–17.0)
MCH: 31.1 pg (ref 26.0–34.0)
MCHC: 33.1 g/dL (ref 30.0–36.0)
MCV: 93.8 fL (ref 80.0–100.0)
Platelets: 246 10*3/uL (ref 150–400)
RBC: 3.86 MIL/uL — ABNORMAL LOW (ref 4.22–5.81)
RDW: 14.2 % (ref 11.5–15.5)
WBC: 9 10*3/uL (ref 4.0–10.5)
nRBC: 0 % (ref 0.0–0.2)

## 2023-06-30 LAB — BASIC METABOLIC PANEL
Anion gap: 9 (ref 5–15)
BUN: 17 mg/dL (ref 8–23)
CO2: 26 mmol/L (ref 22–32)
Calcium: 9 mg/dL (ref 8.9–10.3)
Chloride: 102 mmol/L (ref 98–111)
Creatinine, Ser: 0.81 mg/dL (ref 0.61–1.24)
GFR, Estimated: >60 mL/min (ref >60)
Glucose, Bld: 122 mg/dL — ABNORMAL HIGH (ref 70–99)
Potassium: 3.7 mmol/L (ref 3.5–5.1)
Sodium: 137 mmol/L (ref 135–145)

## 2023-06-30 NOTE — Progress Notes (Signed)
HD#1 Subjective:  Overnight Events: No acute event overnight.  Patient was seen at the bedside.  Not endorsing shortness of breath.  Patient is  not agreeable to discharge to SNF or inpatient rehab.  He says he has enough resources at home for physical therapy.  I have spoken with patient's wife, will have an open conversation with Mr. Both together with his son.  She plans to visit today, around 3 PM.  Vital signs in last 24 hours: Vitals:   06/29/23 1331 06/29/23 2132 06/30/23 0537 06/30/23 1211  BP: (!) 148/67 (!) 143/63 (!) 160/79 135/70  Pulse: 65 60 64 60  Resp: (!) 22 20 19 20   Temp: (!) 97.4 F (36.3 C) (!) 97.4 F (36.3 C) (!) 97.4 F (36.3 C) 97.6 F (36.4 C)  TempSrc: Oral Oral Oral Oral  SpO2: 91% 94% 92% 93%  Weight:       Supplemental O2: Room Air Sating 93%   Physical Exam:   General: Sitting comfortably, not in acute distress. CV: RRR. No m/r/g. No LE edema Pulmonary: Lungs CTAB. Normal effort. No wheezing or rales. MSK: Tenderness over paraspinal muscles of the right lumbar spine.  Filed Weights   06/29/23 1149  Weight: 73.4 kg     Intake/Output Summary (Last 24 hours) at 06/30/2023 1244 Last data filed at 06/30/2023 1221 Gross per 24 hour  Intake 120 ml  Output 1260 ml  Net -1140 ml   Net IO Since Admission: -1,970 mL [06/30/23 1244]  No results for input(s): "GLUCAP" in the last 72 hours.   Pertinent Labs:    Latest Ref Rng & Units 06/30/2023    4:16 AM 06/29/2023    4:03 AM 06/28/2023    8:43 AM  CBC  WBC 4.0 - 10.5 K/uL 9.0  10.7  5.8   Hemoglobin 13.0 - 17.0 g/dL 16.1  09.6  04.5   Hematocrit 39.0 - 52.0 % 36.2  38.4  39.4   Platelets 150 - 400 K/uL 246  263  261        Latest Ref Rng & Units 06/30/2023    4:16 AM 06/29/2023    4:03 AM 06/28/2023    8:43 AM  CMP  Glucose 70 - 99 mg/dL 409  811  914   BUN 8 - 23 mg/dL 17  19  15    Creatinine 0.61 - 1.24 mg/dL 7.82  9.56  2.13   Sodium 135 - 145 mmol/L 137  138  138    Potassium 3.5 - 5.1 mmol/L 3.7  3.6  4.3   Chloride 98 - 111 mmol/L 102  103  102   CO2 22 - 32 mmol/L 26  25  25    Calcium 8.9 - 10.3 mg/dL 9.0  9.6  9.5     Imaging: DG Pelvis Comp Min 3V  Result Date: 06/29/2023 CLINICAL DATA:  Follow-up fracture EXAM: JUDET PELVIS - 3+ VIEW COMPARISON:  06/27/2023 FINDINGS: Surgical hardware transfixing the right iliac bone, sacroiliac joints, and acetabulum. Persistent diastasis of the pubic symphysis. No evidence of hardware fracture or loosening. Bilateral hip joint spaces are preserved. Degenerative changes of the lumbar spine. IMPRESSION: Stable postsurgical changes, as above. Persistent diastasis of the pubic symphysis. Electronically Signed   By: Charline Bills M.D.   On: 06/29/2023 19:45    Assessment/Plan:   Principal Problem:   Acute metabolic encephalopathy Active Problems:   NICM (nonischemic cardiomyopathy) (HCC)   Acute hypoxic respiratory failure (HCC)   COVID-19  Patient Summary: Louis Juarez. is a 82 y.o. male with past history of dilated cardiomyopathy, AICD, hypertension, hyperlipidemia, diabetes presents to the ED evaluation of acute mental status changes, and admitted for acute metabolic encephalopathy.  # Altered mental status # Acute metabolic encephalopathy  He is alert and oriented x 3,  he is afebrile satting on room air.  Leukocytosis has resolved.  Electrolyte repleted. Yesterday, I spoke with patient's wife, family was agreeable to discharge to inpatient rehab.  He passed his initial screening for inpatient rehab, perhaps due to his positive COVID COVID infection.  Upon speaking with patient today, he is not agreeable to inpatient rehab, he would like to go home.  Patient's family will have open conversation today, so we can plan for disposition.  - Follow-up on PT/social worker recommendations. - Continue sertraline 25 mg - Continue lorazepam 1 mg at bedtime  - Continue holding oxycodone, gabapentin and  Robaxin. - Holding all other central acting medicines. - PT/OT.  # Acute hypoxic respiratory failure # COVID infection Improved, satting on room air as above.  Will continue on Decadron until his discharge..  Remdesivir was discontinued yesterday.  - Airborne/contact precautions. -  Monitor.  # Chronic systolic CHF/nonischemic cardiomyopathy. Has been slightly hypertensive today high 140/85.  Will resume patient home dose of Coreg and spironolactone.  On exam today, he is euvolemic.  Will continue holding his home Lasix.   -Continue Coreg 25 mg. -Continue spironolactone 25 mg. -Holding Lasix.   # Pelvic ring fracture s/p percutaneous fixation  Follows with orthopedics, Dr. Jena Gauss, and had a scheduled follow-up for today 12/9.  Patient's wife reached out to Dr. Jena Gauss, there is a tentative plan to get further imaging to assess how his pelvic fractures are doing.  -Completed DG pelvis Comp Min 3V, follow-up with weightbearing restrictions. -Monitor.   Right hip pain. Stable, improved with a lidocaine patch.  Suspect MSK etiology.  -Lidocaine patch.   Stable chronic medical conditions.   Hyperlipidemia -Statin 80 mg -Ezetimibe 10 mg.   GERD -Pantoprazole   Diet: Regular diet VTE: apixaban (ELIQUIS) tablet 2.5 mg Start: 06/28/23 1000apixaban (ELIQUIS) tablet 2.5 mg  Code: DNR PT/OT: Pending ID:  Anti-infectives (From admission, onward)    Start     Dose/Rate Route Frequency Ordered Stop   06/28/23 1000  remdesivir 100 mg in sodium chloride 0.9 % 100 mL IVPB  Status:  Discontinued       Placed in "Followed by" Linked Group   100 mg 200 mL/hr over 30 Minutes Intravenous Daily 06/27/23 1813 06/29/23 0918   06/28/23 1000  sulfamethoxazole-trimethoprim (BACTRIM DS) 800-160 MG per tablet 1 tablet        1 tablet Oral 2 times daily 06/28/23 0722 07/03/23 0959   06/27/23 1915  remdesivir 200 mg in sodium chloride 0.9% 250 mL IVPB       Placed in "Followed by" Linked Group    200 mg 580 mL/hr over 30 Minutes Intravenous Once 06/27/23 1813 06/27/23 2106        Anticipated discharge to pending PT/OT recommendations.  Laretta Bolster, MD 06/30/2023, 12:44 PM Pager: [phone] [facility]  Please contact the on call pager after 5 pm and on weekends at [phone].

## 2023-06-30 NOTE — Plan of Care (Signed)
  Problem: Coping: Goal: Psychosocial and spiritual needs will be supported Outcome: Completed/Met   Problem: Respiratory: Goal: Will maintain a patent airway Outcome: Completed/Met Goal: Complications related to the disease process, condition or treatment will be avoided or minimized Outcome: Completed/Met   Problem: Pain Management: Goal: General experience of comfort will improve Outcome: Completed/Met

## 2023-06-30 NOTE — Progress Notes (Signed)
AP imaging of the pelvis, AP pelvis with Judet a views, performed on 06/29/2023 have been reviewed.  Overall imaging appears stable.  Patient okay to advance to weightbearing for transfers as long as he is not having pain.  If patient develops pain when weightbearing, should go back to nonweightbearing.   Thompson Caul PA-C [facility] [phone] (office) [email or web address]

## 2023-07-01 DIAGNOSIS — G9341 Metabolic encephalopathy: Secondary | ICD-10-CM | POA: Diagnosis not present

## 2023-07-01 DIAGNOSIS — U071 COVID-19: Secondary | ICD-10-CM | POA: Diagnosis not present

## 2023-07-01 NOTE — PMR Pre-admission (Signed)
PMR Admission Coordinator Pre-Admission Assessment  Patient: Louis Juarez. is an 82 y.o., male MRN: [id number] DOB: 08-Jan-1941 Height: 5\' 10"  (177.8 cm) Weight: 73.4 kg  Insurance Information HMO:     PPO:   yes   PCP:      IPA:      80/20:      OTHER:  PRIMARY: [employer]       Policy#: [id number]       Subscriber: [name] CM Name: TBD      Phone#: [phone]     Fax#: [phone] Pre-Cert#: [id number] approved from 07/02/23 to 07/08/23      Employer:  Benefits:  Phone #: [phone]     Name: Ellison Hughs Date: 08/13/2022-07/20/9998 Deductible: $3,200 ($3,200 met) OOP Max: $4,600 ($4,600 met)  CIR: 90% coverage, 10% co-insurance SNF: 90% coverage, 10% co-insurance Outpatient: 90% coverage, 10% co-insurance Home Health: 90% coverage, 10% co-insurance DME: 90% coverage, 10% co-insurance  SECONDARY: Medicare A and B      Policy#: [id number]      Phone#:   Artist:       Phone#:   The Engineer, materials Information Summary" for patients in Inpatient Rehabilitation Facilities with attached "Privacy Act Statement-Health Care Records" was provided and verbally reviewed with: Patient  Emergency Contact Information Contact Information     Name Relation Home Work Mobile   Resnick,sandra Spouse   [phone]      Other Contacts   None on File     Current Medical History  Patient Admitting Diagnosis: Debility, COVID History of Present Illness:   Pt is a 82 y.o. male with PMH includes AICD, HTN, DM, CHF, colon CA, fall (with admit 04/24/23 with R pelvic ORIF & R elbow I&D, d/c to AIR 10/7-11/11/24 following acute admission 10/4-10/7 for hemorrhagic shock from pelvic fractures & aan olecranon fracture . He was brought to [facility]/27/23 with somnolence . PT. Went home AIR previously at min-mod A for scoot transfers via wheelchair (due to NWB status). Family states that on Thursday, 12/5,  he did slide off the bed while urinating  landing on the right shoulder and elbow.  They went to urgent care for imaging which was negative and discharged home. He has developed a drainage from right elbow, and family spoke to Dr. Jena Gauss, who prescribed Bactrim. On 12/7 Pt. Was admitted for workup for acute metabolic encepahlopathy, (+) COVID. Head CT negative for acute abnormality.  Pt. Was seen by PT/OT/SLP and they recommend CIR to assist return to PLOF.     Patient's medical record from [facility] has been reviewed by the rehabilitation admission coordinator and physician.  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 Bilateral pleural effusions,  Question mild subpleural reticulation, indicative of fibrosis.  Coronary artery calcification. 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 Wall thickness was increased in a pattern of mild LVH.- Aortic valve: AV is thckened, calcified with no signifi stenosis.The atrium was severely dilated.: Systolic function was moderately reduced.The atrium was mildly dilated.PA peak pre   Nonischemic cardiomyopathy (HCC)    Palpitations    Pneumonia    PVC (premature ventricular contraction) 07/02/2011   PVC's (premature ventricular contractions)    Wheezing 04/29/2011   Sinus ct 05/2011:    Wheezing     Has the patient had major surgery during 100 days prior to admission? Yes  Family History   family history includes Colon cancer in his father; Melanoma in his mother and sister.  Current Medications  Current Facility-Administered Medications:    acetaminophen (TYLENOL) tablet 650 mg, 650 mg, Oral, Q6H PRN, 650 mg at 07/04/23 0806 **OR** acetaminophen (TYLENOL) suppository 650 mg, 650 mg, Rectal, Q6H PRN, Rana Snare, DO   apixaban (ELIQUIS) tablet 2.5 mg, 2.5 mg, Oral, BID, Rana Snare, DO, 2.5 mg at 07/04/23 1914   carvedilol (COREG) tablet 25 mg, 25 mg, Oral, BID WC, Koomson, Julius, MD, 25 mg at 07/04/23 0806   dexamethasone (DECADRON) tablet 6 mg, 6 mg, Oral, Daily, Rana Snare, DO, 6 mg at 07/04/23 7829   ezetimibe (ZETIA) tablet 10 mg, 10 mg, Oral, QHS, Rana Snare, DO, 10 mg at 07/03/23 2121   lidocaine (LIDODERM) 5 % 1 patch, 1 patch, Transdermal, Daily, Rana Snare, DO, 1 patch at 07/04/23 0806   LORazepam (ATIVAN) tablet 1 mg, 1 mg, Oral, QHS PRN, Laretta Bolster, MD, 1 mg at 07/03/23 1941   pantoprazole (PROTONIX) EC tablet 20 mg, 20 mg, Oral, Daily, Rana Snare, DO, 20 mg at 07/04/23 5621   pravastatin (PRAVACHOL) tablet 80 mg, 80 mg, Oral, QHS, Rana Snare, DO, 80 mg at 07/03/23 2121   senna-docusate (Senokot-S) tablet 1 tablet, 1 tablet, Oral, QHS PRN, Rana Snare, DO   sertraline (ZOLOFT) tablet 25 mg, 25 mg, Oral, Daily, Rana Snare, DO,  25 mg at 07/04/23 3086   spironolactone (ALDACTONE) tablet 25 mg, 25 mg, Oral, Daily, Koomson, Julius, MD, 25 mg at 07/04/23 0806   tamsulosin (FLOMAX) capsule 0.4 mg, 0.4 mg, Oral, QPC supper, Rana Snare, DO, 0.4 mg at 07/03/23 1648  Patients Current Diet:  Diet Order             DIET DYS 3 Room service appropriate? No; Fluid consistency: Thin  Diet effective now                   Precautions / Restrictions Precautions Precautions: Fall Precaution Comments: Airborne/Contact precs Restrictions Weight Bearing Restrictions Per Provider Order: No RUE Weight Bearing Per Provider Order: Weight bearing as tolerated RLE Weight Bearing Per Provider Order: Weight bearing as tolerated LLE Weight Bearing Per Provider Order: Weight bearing as tolerated Other Position/Activity Restrictions: "Patient okay to advance to BLE weightbearing for transfers as long as  he is not having pain.  If patient develops pain when weightbearing, should go back to nonweightbearing" per Ortho note 06/30/23 by Thyra Breed, PA-C.   Has the patient had 2 or more falls or a fall with injury in the past year? Yes  Prior Activity Level Limited Community (1-2x/wk): pt. went out for appts  Prior Functional Level Self Care: Did the patient need help bathing, dressing, using the toilet or eating? Needed some help  Indoor Mobility: Did the patient need assistance with walking from room to room (with or without device)? Needed some help  Stairs: Did the patient need assistance with internal or external stairs (with or without device)? Needed some help  Functional Cognition: Did the patient need help planning regular tasks such as shopping or remembering to take medications? Dependent  Patient Information Are you of Hispanic, Latino/a,or Spanish origin?: A. No, not of Hispanic, Latino/a, or Spanish origin What is your race?: A. White Do you need or want an interpreter to communicate with a doctor or health care  staff?: 0. No  Patient's Response To:  Health Literacy and Transportation Is the patient able to respond to health literacy and transportation needs?: Yes Health Literacy - How often do you need to have someone help you when you read instructions, pamphlets, or other written material from your doctor or pharmacy?: Never In the past 12 months, has lack of transportation kept you from medical appointments or from getting medications?: No In the past 12 months, has lack of transportation kept you from meetings, work, or from getting things needed for daily living?: No  Journalist, newspaper / Equipment Home Equipment: Wheelchair - manual, Other (comment) (sliding board; unclear if pt has a RW)  Prior Device Use: Indicate devices/aids used by the patient prior to current illness, exacerbation or injury? Manual wheelchair  Current Functional Level Cognition  Overall Cognitive Status: Impaired/Different from baseline Current Attention Level: Sustained Orientation Level: Oriented to person Following Commands: Follows one step commands consistently, Follows multi-step commands consistently, Follows multi-step commands with increased time Safety/Judgement: Decreased awareness of safety, Decreased awareness of deficits General Comments: Pt with decreased insight into deficits, occasionally stating "I can walk". Pt reoriented to ortho MD orders and WBAT on BLE for transfers only at this time, pt not yet cleared for longer distance ambulation until MD clears him for this.    Extremity Assessment (includes Sensation/Coordination)  Upper Extremity Assessment: RUE deficits/detail, LUE deficits/detail RUE Deficits / Details: generalized weakness, ROM WFL; mildly decreased fine motor coordination RUE Sensation: WNL RUE Coordination: decreased fine motor LUE Deficits / Details: Overall strength and ROM WFL; decreased fine motor coordination LUE Sensation: WNL LUE Coordination: decreased fine motor   Lower Extremity Assessment: Defer to PT evaluation RLE Deficits / Details: 1/5 ankle DF noted (pt reports this has been since fall); gross hip and knee strength >/ 3/5 LLE Deficits / Details: gross LLE strength > 3/5    ADLs  Overall ADL's : Needs assistance/impaired Eating/Feeding: Set up, Sitting Grooming: Supervision/safety, Cueing for sequencing, Sitting Upper Body Bathing: Minimal assistance, Cueing for sequencing, Cueing for compensatory techniques, Sitting Lower Body Bathing: Maximal assistance, +2 for safety/equipment, Sit to/from stand, Sitting/lateral leans, Cueing for compensatory techniques, Cueing for sequencing Upper Body Dressing : Contact guard assist, Cueing for sequencing, Sitting Lower Body Dressing: Moderate assistance, Cueing for safety, Cueing for sequencing, Sit to/from stand, Sitting/lateral leans Toilet Transfer: Minimal assistance, Moderate assistance, Cueing for safety, Cueing for sequencing, BSC/3in1, Rolling walker (2 wheels) (step-pivot transfers;  cues for hand placement/technique) Toilet Transfer Details (indicate cue type and reason): Pt requiring Min to Mod assist to maintain balance during step-pivot transfers. Toileting- Clothing Manipulation and Hygiene: Maximal assistance, Cueing for safety, Cueing for sequencing, Cueing for compensatory techniques, Sit to/from stand General ADL Comments: Pt with continued decreased activity tolerance, fatiguing quickly during funcitonal tasks. Pt with noted improvements in funcitonal level with transfers and LB dressing due to pt now being WBAT to B LE.    Mobility  Overal bed mobility: Needs Assistance Bed Mobility: Rolling, Sidelying to Sit Rolling: Supervision Sidelying to sit: Supervision Supine to sit: Contact guard Sit to supine: Contact guard assist Sit to sidelying: Contact guard assist General bed mobility comments: use of bed features/rails to perform; Pt with legs hanging over side of bed and bed alarm  sounding as PTA arriving to his room, pt's family present.    Transfers  Overall transfer level: Needs assistance Equipment used: Rolling walker (2 wheels), None Transfers: Sit to/from Stand, Bed to chair/wheelchair/BSC Sit to Stand: Min assist, Mod assist Bed to/from chair/wheelchair/BSC transfer type:: Step pivot Squat pivot transfers: Min assist Step pivot transfers: Mod assist General transfer comment: cues for hand placement/technique; Pt requiring up to Mod assist to maintain balance during step-pivot transfers due to LLE weakness with single leg stance when lifting RLE to take steps; minA to rise from EOB/chair and modA for stand>sit due to poor eccentric control to sit.    Ambulation / Gait / Stairs / Wheelchair Mobility  Ambulation/Gait General Gait Details: no ambulation per MD (ortho) pt WBAT Bil LE for transfers only. Pre-gait activities: standing BLE AROM: hip flexion with RW support x5 reps ea, limited due to LLE weakness/instability when lifting RLE    Posture / Balance Dynamic Sitting Balance Sitting balance - Comments: prolonged static sitting at EOB with supervision Balance Overall balance assessment: Needs assistance Sitting-balance support: No upper extremity supported, Feet supported Sitting balance-Leahy Scale: Fair Sitting balance - Comments: prolonged static sitting at EOB with supervision Postural control: Left lateral lean (able to self-correct with verbal cues) Standing balance support: Bilateral upper extremity supported, During functional activity, Reliant on assistive device for balance Standing balance-Leahy Scale: Poor Standing balance comment: heavy reliance on RW support    Special needs/care consideration Skin Abrasion: elbow/right; Ecchymosis: arm/bilateral    Previous Home Environment (from acute therapy documentation) Living Arrangements: Spouse/significant other Available Help at Discharge: Family, Available 24 hours/day Type of Home:  House Home Layout: Able to live on main level with bedroom/bathroom Home Access: Stairs to enter Entrance Stairs-Rails: Can reach both Entrance Stairs-Number of Steps: 3 Bathroom Shower/Tub: Health visitor: Standard Bathroom Accessibility: Yes How Accessible: Accessible via walker Additional Comments: Pt is an unreliable reporter. Pt report this day does not entirely match chart review. Home Living information taken from chart review as pt stated this day he has12 steps to enter at the front door and 19 at the back door. Pt further reports he is using a w/c at home and reports he is climbing stairs with no issues.  Discharge Living Setting Plans for Discharge Living Setting: Patient's home Type of Home at Discharge: House Discharge Home Layout: Able to live on main level with bedroom/bathroom Discharge Home Access: Stairs to enter Entrance Stairs-Rails: Can reach both Entrance Stairs-Number of Steps: 3 Discharge Bathroom Shower/Tub: Walk-in shower Discharge Bathroom Toilet: Standard Discharge Bathroom Accessibility: Yes How Accessible: Accessible via walker  Social/Family/Support Systems Patient Roles: Spouse Contact Information: [phone] Anticipated Caregiver: Dois Davenport (wife)  Ability/Limitations of Caregiver: can provide min A Caregiver Availability: 24/7 Discharge Plan Discussed with Primary Caregiver: Yes Is Caregiver In Agreement with Plan?: No Does Caregiver/Family have Issues with Lodging/Transportation while Pt is in Rehab?: No  Goals Patient/Family Goal for Rehab: PT/OT min A to supervision w/c level; SLP supervision Expected length of stay: 7-10 days Pt/Family Agrees to Admission and willing to participate: Yes Program Orientation Provided & Reviewed with Pt/Caregiver Including Roles  & Responsibilities: Yes  Decrease burden of Care through IP rehab admission: not anticipated  Possible need for SNF placement upon discharge: not  anticipated  Patient Condition: I have reviewed medical records from [facility], spoken with CM, and patient. I met with patient at the bedside for inpatient rehabilitation assessment.  Patient will benefit from ongoing PT, OT, and SLP, can actively participate in 3 hours of therapy a day 5 days of the week, and can make measurable gains during the admission.  Patient will also benefit from the coordinated team approach during an Inpatient Acute Rehabilitation admission.  The patient will receive intensive therapy as well as Rehabilitation physician, nursing, social worker, and care management interventions.  Due to bladder management, bowel management, safety, skin/wound care, disease management, medication administration, pain management, and patient education the patient requires 24 hour a day rehabilitation nursing.  The patient is currently min A-mod A with mobility and basic ADLs.  Discharge setting and therapy post discharge at home with home health is anticipated.  Patient has agreed to participate in the Acute Inpatient Rehabilitation Program and will admit today.  Preadmission Screen Completed By:  Trish Mage, 07/04/2023 9:41 AM ______________________________________________________________________   Discussed status with Dr. Berline Chough on 07/04/23  at 9:41 AM and received approval for admission today.  Admission Coordinator:  Trish Mage, RN, time 9:41 AM/Date 07/04/23    Assessment/Plan: Diagnosis: Encephalopathy and COVID Does the need for close, 24 hr/day Medical supervision in concert with the patient's rehab needs make it unreasonable for this patient to be served in a less intensive setting? Yes Co-Morbidities requiring supervision/potential complications: COVID- on airborne precautions; pelvic ring fx WBAT LLE only; HTN; dilated CMO; AICD; HLD; DM Due to bladder management, bowel management, safety, skin/wound care, disease management, medication  administration, pain management, and patient education, does the patient require 24 hr/day rehab nursing? Yes Does the patient require coordinated care of a physician, rehab nurse, PT, OT, and SLP to address physical and functional deficits in the context of the above medical diagnosis(es)? Yes Addressing deficits in the following areas: balance, endurance, locomotion, strength, transferring, bowel/bladder control, bathing, dressing, feeding, grooming, toileting, and cognition Can the patient actively participate in an intensive therapy program of at least 3 hrs of therapy 5 days a week? Yes The potential for patient to make measurable gains while on inpatient rehab is good Anticipated functional outcomes upon discharge from inpatient rehab: supervision and min assist PT, w/vc level supervision and min assist OT, supervision SLP Estimated rehab length of stay to reach the above functional goals is: 7-10 days Anticipated discharge destination: Home 10. Overall Rehab/Functional Prognosis: good   MD Signature:

## 2023-07-01 NOTE — Care Management (Signed)
Notified by [facility] that patient was active prior to admission.  Hopefully awaiting for CIR to eval for determination if patient can  transition there when medically ready.

## 2023-07-01 NOTE — Plan of Care (Signed)
  Problem: Activity: Goal: Risk for activity intolerance will decrease Outcome: Progressing   Problem: Safety: Goal: Ability to remain free from injury will improve Outcome: Progressing   Problem: Skin Integrity: Goal: Risk for impaired skin integrity will decrease Outcome: Progressing   

## 2023-07-01 NOTE — Progress Notes (Signed)
Inpatient Rehab Admissions Coordinator:    CIR following, case sent to insurance today.   Megan Salon, MS, CCC-SLP Rehab Admissions Coordinator  [phone] (celll) [phone] (office)

## 2023-07-01 NOTE — Progress Notes (Signed)
Physical Therapy Treatment Patient Details Name: Louis Juarez. MRN: [id number] DOB: May 17, 1941 Today's Date: 07/01/2023   History of Present Illness Pt is a 82 y.o. male admitted 06/27/23 with somnolence; workup for acute metabolic encepahlopathy, (+) COVID. Head CT negative for acute abnormality. Of note, recent ED visit 12/5 after fall and R elbow wound with negative workup. Other PMH includes AICD, HTN, DM, CHF, colon CA, fall (with admit 04/24/23 with R pelvic ORIF & R elbow I&D, d/c to AIR; Dr. Jena Gauss ok with WBAT LLE transfers only on 12/9, awaiting imaging for further updates on RLE)    PT Comments  Patient resting seated at EOB with alarm going off on arrival. Pt stating "I just wanted to sit up here". Re-educated pt on need to call for assistance for safety concerns. Updated pt on new WBAT status for bil LE to transfer and that he is still not approved to ambulate. Pt verbalized understanding but required reminders throughout session with poor STM. Pt completed sit<>stand with RW and min assist. Min assist to steady with pt turning walker to pivot bed>chair. In recliner pt participated in functional strengthening with repeat sit<>stands and ankle pumps to facilitate circulation as pt is not ambulating at present. Pt agreeable to remain OOB in recliner, Alarm on and call bell within reach. Will continue to progress as able.    If plan is discharge home, recommend the following: A little help with walking and/or transfers;A lot of help with bathing/dressing/bathroom;Assistance with cooking/housework;Direct supervision/assist for medications management;Direct supervision/assist for financial management;Assist for transportation;Help with stairs or ramp for entrance;Supervision due to cognitive status   Can travel by private vehicle        Equipment Recommendations  None recommended by PT    Recommendations for Other Services Rehab consult     Precautions / Restrictions  Precautions Precautions: Fall Restrictions Weight Bearing Restrictions: Yes RUE Weight Bearing: Weight bearing as tolerated RLE Weight Bearing: Weight bearing as tolerated LLE Weight Bearing: Weight bearing as tolerated (WBAT for transfers only) Other Position/Activity Restrictions: "Patient okay to advance to weightbearing for transfers as long as he is not having pain.  If patient develops pain when weightbearing, should go back to nonweightbearing" per [facility] note 06/30/23 by Thyra Breed, PA-C.     Mobility  Bed Mobility Overal bed mobility: Needs Assistance             General bed mobility comments: pt seated up EOB at start of session. Min assist to scoot anteriorly for feet on floor.    Transfers Overall transfer level: Needs assistance Equipment used: Rolling walker (2 wheels), None Transfers: Sit to/from Stand, Bed to chair/wheelchair/BSC Sit to Stand: Min assist   Step pivot transfers: Min assist       General transfer comment: cues for hand placement for power up from EOB, pt initially with bil UE on RW, great difficulty to power up, with cues pt performed with min assist to rise from EOB to walker. Min assist to guide walker and maintain safe proximity with turn bed>chair. Pt completed repeated sit<>stands for LE strengthening.    Ambulation/Gait               General Gait Details: no ambulation per MD (ortho) pt WBAT Bil LE for transfers only.   Stairs             Wheelchair Mobility     Tilt Bed    Modified Rankin (Stroke Patients Only)  Balance Overall balance assessment: Needs assistance Sitting-balance support: No upper extremity supported, Feet supported Sitting balance-Leahy Scale: Fair     Standing balance support: Bilateral upper extremity supported, Reliant on assistive device for balance Standing balance-Leahy Scale: Poor                              Cognition Arousal: Alert Behavior During Therapy:  WFL for tasks assessed/performed Overall Cognitive Status: Impaired/Different from baseline Area of Impairment: Memory, Safety/judgement, Awareness                 Orientation Level: Situation, Time Current Attention Level: Sustained Memory: Decreased short-term memory, Decreased recall of precautions Following Commands: Follows one step commands consistently, Follows multi-step commands consistently Safety/Judgement: Decreased awareness of safety, Decreased awareness of deficits Awareness: Emergent            Exercises Other Exercises Other Exercises: 2x5 reps sit<>stand with cues for technique with bil UE use to power up and reach back for controlled lowering. Other Exercises: 10x ankle pumps in recliner - educated to perform throughout day.    General Comments        Pertinent Vitals/Pain Pain Assessment Pain Assessment: No/denies pain Faces Pain Scale: No hurt Pain Intervention(s): Limited activity within patient's tolerance, Monitored during session, Repositioned    Home Living                          Prior Function            PT Goals (current goals can now be found in the care plan section) Acute Rehab PT Goals Patient Stated Goal: return to prior level of mobility PT Goal Formulation: With patient Time For Goal Achievement: 07/12/23 Potential to Achieve Goals: Good    Frequency    Min 1X/week      PT Plan      Co-evaluation              AM-PAC PT "6 Clicks" Mobility   Outcome Measure  Help needed turning from your back to your side while in a flat bed without using bedrails?: A Little Help needed moving from lying on your back to sitting on the side of a flat bed without using bedrails?: A Little Help needed moving to and from a bed to a chair (including a wheelchair)?: A Little Help needed standing up from a chair using your arms (e.g., wheelchair or bedside chair)?: A Little Help needed to walk in hospital room?:  Total Help needed climbing 3-5 steps with a railing? : Total 6 Click Score: 14    End of Session Equipment Utilized During Treatment: Gait belt Activity Tolerance: Patient tolerated treatment well Patient left: in bed;with bed alarm set;with call bell/phone within reach Nurse Communication: Mobility status PT Visit Diagnosis: Other abnormalities of gait and mobility (R26.89);Muscle weakness (generalized) (M62.81)     Time: 2130-8657 PT Time Calculation (min) (ACUTE ONLY): 25 min  Charges:    $Therapeutic Activity: 23-37 mins PT General Charges $$ ACUTE PT VISIT: 1 Visit                     Wynn Maudlin, DPT Acute Rehabilitation Services Office [phone]  07/01/23 9:47 AM

## 2023-07-01 NOTE — Progress Notes (Signed)
HD#2 Subjective:  Overnight Events: No acute event overnight.  Patient was seen at the bedside.  Patient's wife, and son had a discussion yesterday, with a plan for inpatient rehab, currently pending insurance approval.   Vital signs in last 24 hours: Vitals:   06/30/23 1727 06/30/23 2004 07/01/23 0952 07/01/23 1323  BP: 136/76 136/87 (!) 142/71 136/69  Pulse:  60 60 60  Resp:  20 19 20   Temp:  97.7 F (36.5 C) (!) 97.4 F (36.3 C) 97.8 F (36.6 C)  TempSrc:  Oral Oral Oral  SpO2: 94% 92% 96% 93%  Weight:      Height:    5\' 10"  (1.778 m)   Supplemental O2: Room Air Sating 93%   Physical Exam:   General: Sitting comfortably, not in acute distress. CV: RRR. No m/r/g. No LE edema Pulmonary: Lungs CTAB. Normal effort. No wheezing or rales. MSK: Tenderness over paraspinal muscles of the right lumbar spine.  Filed Weights   06/29/23 1149  Weight: 73.4 kg     Intake/Output Summary (Last 24 hours) at 07/01/2023 1415 Last data filed at 06/30/2023 2355 Gross per 24 hour  Intake 476 ml  Output 615 ml  Net -139 ml   Net IO Since Admission: -2,109 mL [07/01/23 1415]  No results for input(s): "GLUCAP" in the last 72 hours.   Pertinent Labs:    Latest Ref Rng & Units 06/30/2023    4:16 AM 06/29/2023    4:03 AM 06/28/2023    8:43 AM  CBC  WBC 4.0 - 10.5 K/uL 9.0  10.7  5.8   Hemoglobin 13.0 - 17.0 g/dL 23.5  57.3  22.0   Hematocrit 39.0 - 52.0 % 36.2  38.4  39.4   Platelets 150 - 400 K/uL 246  263  261        Latest Ref Rng & Units 06/30/2023    4:16 AM 06/29/2023    4:03 AM 06/28/2023    8:43 AM  CMP  Glucose 70 - 99 mg/dL 254  270  623   BUN 8 - 23 mg/dL 17  19  15    Creatinine 0.61 - 1.24 mg/dL 7.62  8.31  5.17   Sodium 135 - 145 mmol/L 137  138  138   Potassium 3.5 - 5.1 mmol/L 3.7  3.6  4.3   Chloride 98 - 111 mmol/L 102  103  102   CO2 22 - 32 mmol/L 26  25  25    Calcium 8.9 - 10.3 mg/dL 9.0  9.6  9.5     Imaging: No results  found.  Assessment/Plan:   Principal Problem:   Acute metabolic encephalopathy Active Problems:   NICM (nonischemic cardiomyopathy) (HCC)   Acute hypoxic respiratory failure (HCC)   COVID-19   Patient Summary: Louis Juarez. is a 82 y.o. male with past history of dilated cardiomyopathy, AICD, hypertension, hyperlipidemia, diabetes presents to the ED evaluation of acute mental status changes, and admitted for acute metabolic encephalopathy.  # Altered mental status # Acute metabolic encephalopathy  Alert, and oriented x 3.  Continues to be afebrile, satting on room air.  Patient is medically ready for discharge to inpatient rehab, currently waiting for insurance approval.  -Follow-up on social worker - Continue sertraline 25 mg - Continue lorazepam 1 mg at bedtime  - Continue holding oxycodone, gabapentin and Robaxin. - Holding all other central acting medicines.   # Acute hypoxic respiratory failure # COVID infection Resolved, satting on room air.  Will continue on Decadron to discharge.  - Airborne/contact precautions. -  Monitor.  # Chronic systolic CHF/nonischemic cardiomyopathy. Normotensive, not volume overloaded on exam. Will continue holding his home Lasix.   -Continue Coreg 25 mg. -Continue spironolactone 25 mg. -Holding Lasix.   # Pelvic ring fracture s/p percutaneous fixation   Dr. Jena Gauss ok with WBAT LLE transfers only on 12/9, awaiting imaging for further updates on RLE -Follow-up on recommendations for the right lower extremity. -Monitor.   Right hip pain. Stable, has intermittent lidocaine patch as needed.  Stable chronic medical conditions.   Hyperlipidemia -Statin 80 mg -Ezetimibe 10 mg.   GERD -Pantoprazole   Diet: Regular diet VTE: apixaban (ELIQUIS) tablet 2.5 mg Start: 06/28/23 1000apixaban (ELIQUIS) tablet 2.5 mg  Code: DNR PT/OT: Pending ID:  Anti-infectives (From admission, onward)    Start     Dose/Rate Route Frequency Ordered  Stop   06/28/23 1000  remdesivir 100 mg in sodium chloride 0.9 % 100 mL IVPB  Status:  Discontinued       Placed in "Followed by" Linked Group   100 mg 200 mL/hr over 30 Minutes Intravenous Daily 06/27/23 1813 06/29/23 0918   06/28/23 1000  sulfamethoxazole-trimethoprim (BACTRIM DS) 800-160 MG per tablet 1 tablet        1 tablet Oral 2 times daily 06/28/23 0722 07/03/23 0959   06/27/23 1915  remdesivir 200 mg in sodium chloride 0.9% 250 mL IVPB       Placed in "Followed by" Linked Group   200 mg 580 mL/hr over 30 Minutes Intravenous Once 06/27/23 1813 06/27/23 2106        Anticipated discharge to pending PT/OT recommendations.  Laretta Bolster, MD 07/01/2023, 2:15 PM Pager: [phone] [facility]  Please contact the on call pager after 5 pm and on weekends at [phone].

## 2023-07-01 NOTE — Progress Notes (Signed)
Occupational Therapy Treatment Patient Details Name: Louis Juarez. MRN: [id number] DOB: [birth year] Today's Date: 07/01/2023   History of present illness Pt is a 82 y.o. male admitted 06/27/23 with somnolence; workup for acute metabolic encepahlopathy, (+) COVID. Head CT negative for acute abnormality. Of note, recent ED visit 12/5 after fall and R elbow wound with negative workup. Other PMH includes AICD, HTN, DM, CHF, colon CA, fall (with admit 04/24/23 with R pelvic ORIF & R elbow I&D, d/c to AIR; Dr. Jena Gauss ok with WBAT LLE transfers only on 12/9, awaiting imaging for further updates on RLE)   OT comments  OT session focused on training in techniques for increased safety and independence with ADLs, bed mobility in preparation for ADLs, and functional tranfers during ADLs. Pt currently demonstrates ability to complete UB ADLs with Set up to Contact guard assist, LB ADLs with Mod to Max assist, bed mobility with Supervision to Contact guard assist, and functional step-pivot transfer with a RW Min to Mod assist. Pt with noted improvements compared to last skilled OT session in alertness, ability to follow instructions, and functional level following MD upgrading to WBAT. Pt reported no pain in B UE/LE this session. Pt participated well in session and is making progress toward goals. Pt will benefit from continued acute skilled OT services to address deficits outlined below and increase safety and independence with functional tasks. Based on pt progress in acute rehab, discharge recommendation upgraded this session. Post acute discharge, OT recommends intensive inpatient skilled rehab services > 3 hours per day to maximize rehab potential.       If plan is discharge home, recommend the following:  A lot of help with walking and/or transfers;A lot of help with bathing/dressing/bathroom;Assistance with cooking/housework;Assistance with feeding;Direct supervision/assist for medications management;Direct  supervision/assist for financial management;Assist for transportation;Help with stairs or ramp for entrance;Supervision due to cognitive status (Set up for self feeding)   Equipment Recommendations  Other (comment) (defer to next level of care)    Recommendations for Other Services Rehab consult    Precautions / Restrictions Precautions Precautions: Fall Restrictions Weight Bearing Restrictions: Yes RUE Weight Bearing: Weight bearing as tolerated RLE Weight Bearing: Weight bearing as tolerated LLE Weight Bearing: Weight bearing as tolerated (for tansfers only) Other Position/Activity Restrictions: "Patient okay to advance to weightbearing for transfers as long as he is not having pain.  If patient develops pain when weightbearing, should go back to nonweightbearing" per [facility] note 06/30/23 by Thyra Breed, PA-C.       Mobility Bed Mobility Overal bed mobility: Needs Assistance Bed Mobility: Rolling, Sidelying to Sit, Sit to Sidelying Rolling: Supervision Sidelying to sit: Supervision     Sit to sidelying: Contact guard assist      Transfers Overall transfer level: Needs assistance Equipment used: Rolling walker (2 wheels), None Transfers: Sit to/from Stand, Bed to chair/wheelchair/BSC Sit to Stand: Min assist     Step pivot transfers: Min assist, Mod assist     General transfer comment: cues for hand placement/technique; Pt requiring Min to Mod assist to maintain balance during step-pivot transfers.     Balance Overall balance assessment: Needs assistance Sitting-balance support: No upper extremity supported, Feet supported Sitting balance-Leahy Scale: Fair     Standing balance support: Bilateral upper extremity supported, During functional activity, Reliant on assistive device for balance Standing balance-Leahy Scale: Poor  ADL either performed or assessed with clinical judgement   ADL Overall ADL's : Needs  assistance/impaired Eating/Feeding: Set up;Sitting   Grooming: Supervision/safety;Cueing for sequencing;Sitting           Upper Body Dressing : Contact guard assist;Cueing for sequencing;Sitting   Lower Body Dressing: Moderate assistance;Cueing for safety;Cueing for sequencing;Sit to/from stand;Sitting/lateral leans   Toilet Transfer: Minimal assistance;Moderate assistance;Cueing for safety;Cueing for sequencing;BSC/3in1;Rolling walker (2 wheels) (step-pivot transfers; cues for hand placement/technique) Toilet Transfer Details (indicate cue type and reason): Pt requiring Min to Mod assist to maintain balance during step-pivot transfers. Toileting- Clothing Manipulation and Hygiene: Maximal assistance;Cueing for safety;Cueing for sequencing;Cueing for compensatory techniques;Sit to/from stand         General ADL Comments: Pt with continued decreased activity tolerance, fatiguing quickly during funcitonal tasks. Pt with noted improvements in funcitonal level with transfers and LB dressing due to pt now being WBAT to B LE.    Extremity/Trunk Assessment Upper Extremity Assessment Upper Extremity Assessment: RUE deficits/detail;LUE deficits/detail RUE Deficits / Details: generalized weakness, ROM WFL; mildly decreased fine motor coordination RUE Coordination: decreased fine motor LUE Deficits / Details: Overall strength and ROM WFL; decreased fine motor coordination LUE Sensation: WNL LUE Coordination: decreased fine motor   Lower Extremity Assessment Lower Extremity Assessment: Defer to PT evaluation        Vision       Perception     Praxis      Cognition Arousal: Alert Behavior During Therapy: WFL for tasks assessed/performed Overall Cognitive Status: Impaired/Different from baseline Area of Impairment: Memory, Safety/judgement, Awareness, Attention, Following commands, Problem solving                 Orientation Level: Disoriented to, Time, Situation (Pt  oriented to self, wife, and being in the hospital. Pt not oriented to reason for hospitalization.) Current Attention Level: Sustained Memory: Decreased short-term memory, Decreased recall of precautions Following Commands: Follows one step commands consistently, Follows multi-step commands consistently, Follows multi-step commands with increased time Safety/Judgement: Decreased awareness of safety, Decreased awareness of deficits Awareness: Emergent Problem Solving: Slow processing, Decreased initiation, Difficulty sequencing, Requires verbal cues General Comments: Pt with noted improvements in alertness and ability to follow 1 and multi-step commands this session as compared to last skilled OT session.        Exercises      Shoulder Instructions       General Comments HR and O2 sat stable on RA throughout session. Pt reports no dizziness or lightheadedness. Pt's wife leaving room at beginning of session.    Pertinent Vitals/ Pain       Pain Assessment Pain Assessment: Faces Faces Pain Scale: Hurts a little bit Pain Location: Sacral area Pain Descriptors / Indicators: Sore Pain Intervention(s): Limited activity within patient's tolerance, Monitored during session, Repositioned, Other (comment) (Notified RN)  Home Living                                          Prior Functioning/Environment              Frequency  Min 1X/week        Progress Toward Goals  OT Goals(current goals can now be found in the care plan section)  Progress towards OT goals: Progressing toward goals  Acute Rehab OT Goals Patient Stated Goal: to return home and be able to walk without assist  Plan  Co-evaluation                 AM-PAC OT "6 Clicks" Daily Activity     Outcome Measure   Help from another person eating meals?: A Little Help from another person taking care of personal grooming?: A Little Help from another person toileting, which includes using  toliet, bedpan, or urinal?: A Lot Help from another person bathing (including washing, rinsing, drying)?: A Lot Help from another person to put on and taking off regular upper body clothing?: A Little Help from another person to put on and taking off regular lower body clothing?: A Lot 6 Click Score: 15    End of Session Equipment Utilized During Treatment: Rolling walker (2 wheels);Gait belt;Other (comment) (BSC)  OT Visit Diagnosis: Unsteadiness on feet (R26.81);Other abnormalities of gait and mobility (R26.89);Muscle weakness (generalized) (M62.81);Ataxia, unspecified (R27.0);History of falling (Z91.81);Other symptoms and signs involving cognitive function;Other (comment) (decreased activity tolerance)   Activity Tolerance Patient tolerated treatment well   Patient Left in bed;with call bell/phone within reach;with bed alarm set   Nurse Communication Mobility status;Other (comment) (Pt reports soreness in sacral area.)        Time: 0865-7846 OT Time Calculation (min): 29 min  Charges: OT General Charges $OT Visit: 1 Visit OT Treatments $Self Care/Home Management : 23-37 mins  Cassey Bacigalupo "Orson Eva., OTR/L, MA Acute Rehab [phone]   Lendon Colonel 07/01/2023, 12:30 PM

## 2023-07-02 DIAGNOSIS — G9341 Metabolic encephalopathy: Secondary | ICD-10-CM | POA: Diagnosis not present

## 2023-07-02 DIAGNOSIS — U071 COVID-19: Secondary | ICD-10-CM | POA: Diagnosis not present

## 2023-07-02 LAB — CULTURE, BLOOD (ROUTINE X 2)
Culture: NO GROWTH
Culture: NO GROWTH

## 2023-07-02 NOTE — Progress Notes (Signed)
IP rehab admissions - We have received approval for CIR admission from [facility].  Currently rehab beds are full.  I will have my partner follow up tomorrow for bed availability and medical readiness.  Call for questions.  [phone]

## 2023-07-02 NOTE — Progress Notes (Addendum)
HD#3 Subjective:  Overnight Events: No acute event overnight.  Was examined at bedside, not endorsing shortness of breath or chest pain.  Still waiting on insurance authorization to inpatient rehab.  Vital signs in last 24 hours: Vitals:   07/01/23 1717 07/01/23 2022 07/02/23 0339 07/02/23 0958  BP: (!) 149/73 (!) 143/60 (!) 154/79 (!) 154/76  Pulse: (!) 59 (!) 59 60 66  Resp: 18 16 18 18   Temp: 97.7 F (36.5 C) 97.6 F (36.4 C) 97.7 F (36.5 C) (!) 97.4 F (36.3 C)  TempSrc: Oral Oral Oral Oral  SpO2: 95% 96% 97% 95%  Weight:      Height:       Supplemental O2: Room Air Sating 93%   Physical Exam:   General: Sitting comfortably, not in acute distress. CV: RRR. No m/r/g. No LE edema Pulmonary: Lungs CTAB. Normal effort. No wheezing or rales. MSK: Tenderness over paraspinal muscles of the right lumbar spine.  Filed Weights   06/29/23 1149  Weight: 73.4 kg     Intake/Output Summary (Last 24 hours) at 07/02/2023 1127 Last data filed at 07/02/2023 0340 Gross per 24 hour  Intake 840 ml  Output 625 ml  Net 215 ml   Net IO Since Admission: -1,654 mL [07/02/23 1127]  No results for input(s): "GLUCAP" in the last 72 hours.   Pertinent Labs:    Latest Ref Rng & Units 06/30/2023    4:16 AM 06/29/2023    4:03 AM 06/28/2023    8:43 AM  CBC  WBC 4.0 - 10.5 K/uL 9.0  10.7  5.8   Hemoglobin 13.0 - 17.0 g/dL 16.1  09.6  04.5   Hematocrit 39.0 - 52.0 % 36.2  38.4  39.4   Platelets 150 - 400 K/uL 246  263  261        Latest Ref Rng & Units 06/30/2023    4:16 AM 06/29/2023    4:03 AM 06/28/2023    8:43 AM  CMP  Glucose 70 - 99 mg/dL 409  811  914   BUN 8 - 23 mg/dL 17  19  15    Creatinine 0.61 - 1.24 mg/dL 7.82  9.56  2.13   Sodium 135 - 145 mmol/L 137  138  138   Potassium 3.5 - 5.1 mmol/L 3.7  3.6  4.3   Chloride 98 - 111 mmol/L 102  103  102   CO2 22 - 32 mmol/L 26  25  25    Calcium 8.9 - 10.3 mg/dL 9.0  9.6  9.5     Imaging: No results  found.  Assessment/Plan:   Principal Problem:   Acute metabolic encephalopathy Active Problems:   NICM (nonischemic cardiomyopathy) (HCC)   Acute hypoxic respiratory failure (HCC)   COVID-19   Patient Summary: Louis Theiss. is a 82 y.o. male with past history of dilated cardiomyopathy, AICD, hypertension, hyperlipidemia, diabetes presents to the ED evaluation of acute mental status changes, and admitted for acute metabolic encephalopathy.  # Altered mental status # Acute metabolic encephalopathy  Alert, and oriented x 3.  Continues to be afebrile, satting on room air.  Patient is medically ready for discharge to inpatient rehab, currently waiting for insurance approval.  -Follow-up on social worker - Continue sertraline 25 mg - Continue lorazepam 1 mg at bedtime  - Continue holding oxycodone, gabapentin and Robaxin. - Holding all other central acting medicines.   # Acute hypoxic respiratory failure # COVID infection Resolved, satting on room air.  Will continue on Decadron to discharge.  - Airborne/contact precautions. -  Monitor.  # Chronic systolic CHF/nonischemic cardiomyopathy. Normotensive, not volume overloaded on exam. Will continue holding his home Lasix.   -Continue Coreg 25 mg. -Continue spironolactone 25 mg. -Holding Lasix.   # Pelvic ring fracture s/p percutaneous fixation   Dr. Jena Gauss ok with WBAT LLE transfers only on 12/9, awaiting imaging for further updates on RLE -Follow-up on recommendations for the right lower extremity. -Monitor.   Right hip pain. Stable, has intermittent lidocaine patch as needed.  Stable chronic medical conditions.   Hyperlipidemia -Statin 80 mg -Ezetimibe 10 mg.   GERD -Pantoprazole   Diet: Regular diet VTE: apixaban (ELIQUIS) tablet 2.5 mg Start: 06/28/23 1000apixaban (ELIQUIS) tablet 2.5 mg  Code: DNR PT/OT: Pending ID:  Anti-infectives (From admission, onward)    Start     Dose/Rate Route Frequency Ordered  Stop   06/28/23 1000  remdesivir 100 mg in sodium chloride 0.9 % 100 mL IVPB  Status:  Discontinued       Placed in "Followed by" Linked Group   100 mg 200 mL/hr over 30 Minutes Intravenous Daily 06/27/23 1813 06/29/23 0918   06/28/23 1000  sulfamethoxazole-trimethoprim (BACTRIM DS) 800-160 MG per tablet 1 tablet        1 tablet Oral 2 times daily 06/28/23 0722 07/03/23 0959   06/27/23 1915  remdesivir 200 mg in sodium chloride 0.9% 250 mL IVPB       Placed in "Followed by" Linked Group   200 mg 580 mL/hr over 30 Minutes Intravenous Once 06/27/23 1813 06/27/23 2106        Anticipated discharge to CIR pending insurance approval.  Laretta Bolster, MD 07/02/2023, 11:27 AM Pager: [phone] [facility]  Please contact the on call pager after 5 pm and on weekends at [phone].

## 2023-07-02 NOTE — Progress Notes (Signed)
Physical Therapy Treatment Patient Details Name: Louis Juarez. MRN: [id number] DOB: February 13, 1941 Today's Date: 07/02/2023   History of Present Illness Pt is a 82 y.o. male admitted 06/27/23 with somnolence; workup for acute metabolic encepahlopathy, (+) COVID. Head CT negative for acute abnormality. Of note, recent ED visit 12/5 after fall and R elbow wound with negative workup. "AP imaging of the pelvis, AP pelvis with Judet a views, performed on 06/29/2023 have been reviewed. Overall imaging appears stable. Patient okay to advance to weightbearing for transfers as long as he is not having pain. If patient develops pain when weightbearing, should go back to nonweightbearing" per ortho PA [facility] 12/10. Other PMH includes AICD, HTN, DM, CHF, colon CA, fall (with admit 04/24/23 with R pelvic ORIF & R elbow I&D, d/c to AIR.    PT Comments  Pt received in supine, agreeable to therapy session and eager to progress with transfer training and supine/seated exercises. Pt needing up to modA for step pivot transfers and lift/lowering assist due to BLE weakness (R>L) and instability. Pt with decreased insight into deficits and needs reminders for safer technique each transfer and demonstrates some short term memory deficits and anxiety regarding hospital environment, pt often asking about going home vs to higher intensity rehab unit; family in room encouraging him/reminding him of situation and need to wait for CIR bed availability. Pt continues to benefit from PT services to progress toward functional mobility goals.    If plan is discharge home, recommend the following: A little help with walking and/or transfers;A lot of help with bathing/dressing/bathroom;Assistance with cooking/housework;Direct supervision/assist for medications management;Direct supervision/assist for financial management;Assist for transportation;Help with stairs or ramp for entrance;Supervision due to cognitive status   Can travel by  private vehicle        Equipment Recommendations  None recommended by PT    Recommendations for Other Services       Precautions / Restrictions Precautions Precautions: Fall Precaution Comments: Airborne/Contact precs Restrictions Weight Bearing Restrictions Per Provider Order: Yes RUE Weight Bearing Per Provider Order: Weight bearing as tolerated RLE Weight Bearing Per Provider Order: Weight bearing as tolerated LLE Weight Bearing Per Provider Order: Weight bearing as tolerated Other Position/Activity Restrictions: "Patient okay to advance to BLE weightbearing for transfers as long as he is not having pain.  If patient develops pain when weightbearing, should go back to nonweightbearing" per Ortho note 06/30/23 by Thyra Breed, PA-C.     Mobility  Bed Mobility Overal bed mobility: Needs Assistance Bed Mobility: Rolling, Sidelying to Sit Rolling: Supervision Sidelying to sit: Supervision       General bed mobility comments: use of bed features/rails to perform; Pt with legs hanging over side of bed and bed alarm sounding as PTA arriving to his room, pt's family present.    Transfers Overall transfer level: Needs assistance Equipment used: Rolling walker (2 wheels), None Transfers: Sit to/from Stand, Bed to chair/wheelchair/BSC Sit to Stand: Min assist, Mod assist   Step pivot transfers: Mod assist       General transfer comment: cues for hand placement/technique; Pt requiring up to Mod assist to maintain balance during step-pivot transfers due to LLE weakness with single leg stance when lifting RLE to take steps; minA to rise from EOB/chair and modA for stand>sit due to poor eccentric control to sit.    Ambulation/Gait             Pre-gait activities: standing BLE AROM: hip flexion with RW support x5 reps ea, limited  due to LLE weakness/instability when lifting RLE     Stairs             Wheelchair Mobility     Tilt Bed    Modified Rankin  (Stroke Patients Only)       Balance Overall balance assessment: Needs assistance Sitting-balance support: No upper extremity supported, Feet supported Sitting balance-Leahy Scale: Fair     Standing balance support: Bilateral upper extremity supported, During functional activity, Reliant on assistive device for balance Standing balance-Leahy Scale: Poor Standing balance comment: heavy reliance on RW support                            Cognition Arousal: Alert Behavior During Therapy: WFL for tasks assessed/performed Overall Cognitive Status: Impaired/Different from baseline Area of Impairment: Memory, Safety/judgement, Awareness, Attention, Following commands, Problem solving                 Orientation Level: Disoriented to, Time, Situation (Pt oriented to self, wife, and being in the hospital. Pt not oriented to reason for hospitalization.) Current Attention Level: Sustained Memory: Decreased short-term memory, Decreased recall of precautions Following Commands: Follows one step commands consistently, Follows multi-step commands consistently, Follows multi-step commands with increased time Safety/Judgement: Decreased awareness of safety, Decreased awareness of deficits Awareness: Emergent Problem Solving: Slow processing, Decreased initiation, Difficulty sequencing, Requires verbal cues General Comments: Pt with decreased insight into deficits, occasionally stating "I can walk". Pt reoriented to ortho MD orders and WBAT on BLE for transfers only at this time, pt not yet cleared for longer distance ambulation until MD clears him for this.        Exercises Other Exercises Other Exercises: chair push-ups x 10 reps with emphasis on tricep and core activation Other Exercises: seated BLE AROM: LAQ x15 reps ea (cues for longer hold in extension), ankle pumps x10 reps ea Other Exercises: STS x 5 reps cues for safe UE placement Other Exercises: standing BLE AROM: hip  flexion x5 reps (less safe wtih RLE hip flexion given prolonged LE NWB status recently and pt trouble with LLE SLS so defer more reps)    General Comments General comments (skin integrity, edema, etc.): HR and SpO2 WFL on RA; BP 159/87 (107) sitting EOB, BP 154/83 (103) standing at RW; BP 166/82 (106) sitting in chair      Pertinent Vitals/Pain Pain Assessment Pain Assessment: No/denies pain Pain Intervention(s): Monitored during session, Repositioned    Home Living                          Prior Function            PT Goals (current goals can now be found in the care plan section) Acute Rehab PT Goals PT Goal Formulation: With patient Time For Goal Achievement: 07/12/23 Progress towards PT goals: Progressing toward goals    Frequency    Min 1X/week      PT Plan      Co-evaluation              AM-PAC PT "6 Clicks" Mobility   Outcome Measure  Help needed turning from your back to your side while in a flat bed without using bedrails?: A Little Help needed moving from lying on your back to sitting on the side of a flat bed without using bedrails?: A Little Help needed moving to and from a bed to a chair (including a  wheelchair)?: A Lot Help needed standing up from a chair using your arms (e.g., wheelchair or bedside chair)?: A Lot Help needed to walk in hospital room?: Total Help needed climbing 3-5 steps with a railing? : Total 6 Click Score: 12    End of Session Equipment Utilized During Treatment: Gait belt Activity Tolerance: Patient tolerated treatment well Patient left: in chair;with call bell/phone within reach;with chair alarm set;with family/visitor present Nurse Communication: Mobility status;Other (comment) (pt will need assist for return transfer to bed from chair) PT Visit Diagnosis: Other abnormalities of gait and mobility (R26.89);Muscle weakness (generalized) (M62.81)     Time: 2841-3244 PT Time Calculation (min) (ACUTE ONLY): 26  min  Charges:    $Therapeutic Exercise: 8-22 mins $Therapeutic Activity: 8-22 mins PT General Charges $$ ACUTE PT VISIT: 1 Visit                     Carma Dwiggins P., PTA Acute Rehabilitation Services Secure Chat Preferred 9a-5:30pm Office: [phone]    Dorathy Kinsman [facility] 07/02/2023, 5:55 PM

## 2023-07-03 DIAGNOSIS — G9341 Metabolic encephalopathy: Secondary | ICD-10-CM | POA: Diagnosis not present

## 2023-07-03 NOTE — Progress Notes (Signed)
Inpatient Rehab Admissions Coordinator:  Spoke with pt's wife on the telephone. Informed her that plan to admit pt to CIR tomorrow. Dr. Berline Chough will assess pt in the morning. Will continue to follow.   Wolfgang Phoenix, MS, CCC-SLP Admissions Coordinator [phone]

## 2023-07-03 NOTE — H&P (Signed)
Physical Medicine and Rehabilitation Admission H&P   CC: Functional deficits secondary to encephalopathy  HPI: Louis Juarez, [name] is an 82 year old R handed  male who presented to the emergency department on 06/26/2022 from home for altered mental status.  Family stated he was not responding as normal and had fallen several times. (Of note, pt said felt "fine" but just tired and lightheaded.   A right elbow wound was reported.  His medical history is significant for recent hospitalization for hemorrhagic shock from pelvic fractures and and olecranon fracture discharged to skilled nursing facility.  He was discharged from there to home with family.  At home he was taking lorazepam and, gabapentin, Robaxin and oxycodone.  Was found to be COVID positive and admitted and placed on dexamethasone and remdesivir.  Mental status improved.  Lidoderm patch applied for low back pain.  PT and OT evaluations obtained.  Restarted patient's lorazepam and gabapentin to prevent any withdrawal from central acting drugs.  Sertraline also continued.  He was to follow-up with Dr. Jena Gauss for his pelvic ring fracture status post percutaneous fixation- is now WBAT B/L LE's.  .  Eliquis 2.5 mg BID initiated 12/08 for DVT prophylaxis.  Continue dexamethasone through 12/17.  Tolerating regular diet. The patient requires inpatient medicine and rehabilitation evaluations and services for ongoing dysfunction secondary to debility.   Also after nursing told me he fell out of chair this afternoon- pt denied true fall- said "slipped out of chair"- and "only pain is ego"   Review of Systems  Constitutional:  Positive for malaise/fatigue.  HENT: Negative.    Eyes: Negative.   Respiratory: Negative.         Said no SOB or Resp Sx's after COVID  Cardiovascular: Negative.   Gastrointestinal:  Negative for constipation, diarrhea and nausea.  Genitourinary:  Negative for dysuria, frequency and urgency.  Musculoskeletal: Negative.    Skin: Negative.   Neurological:  Positive for dizziness.  Endo/Heme/Allergies: Negative.   Psychiatric/Behavioral:  The patient has insomnia.   All other systems reviewed and are negative.  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 Bilateral pleural effusions,  Question mild subpleural reticulation, indicative of fibrosis.  Coronary artery calcification. 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 Wall  thickness was increased in a pattern of mild LVH.- Aortic valve: AV is thckened, calcified with no signifi stenosis.The atrium was severely dilated.: Systolic function was moderately reduced.The atrium was mildly dilated.PA peak pre   Nonischemic cardiomyopathy (HCC)    Palpitations    Pneumonia    PVC (premature  ventricular contraction) 07/02/2011   PVC's (premature ventricular contractions)    Wheezing 04/29/2011   Sinus ct 05/2011:    Wheezing    Past Surgical History:  Procedure Laterality Date   BI-VENTRICULAR IMPLANTABLE CARDIOVERTER DEFIBRILLATOR N/A 02/13/2012   Procedure: BI-VENTRICULAR IMPLANTABLE CARDIOVERTER DEFIBRILLATOR  (CRT-D);  Surgeon: Hillis Range, MD;  Location: [facility] CATH LAB;  Service: Cardiovascular;  Laterality: N/A;   BI-VENTRICULAR IMPLANTABLE CARDIOVERTER DEFIBRILLATOR  (CRT-D)  02/13/2012   BI-VENTRICULAR IMPLANTABLE CARDIOVERTER DEFIBRILLATOR  (CRT-D)  05/03/2019   CHANGEOUT   BIV ICD GENERATOR CHANGEOUT N/A 05/03/2019   Procedure: BIV ICD GENERATOR CHANGEOUT;  Surgeon: Hillis Range, MD;  Location: [facility] INVASIVE CV LAB;  Service: Cardiovascular;  Laterality: N/A;   CARDIAC CATHETERIZATION  2013   CARDIAC DEFIBRILLATOR PLACEMENT  02/13/2012   SJM [employer] BIV ICD implanted by Dr Johney Frame   IRRIGATION AND DEBRIDEMENT ELBOW Right 05/08/2023   Procedure: IRRIGATION AND DEBRIDEMENT ELBOW;  Surgeon: Roby Lofts, MD;  Location: [facility] OR;  Service: Orthopedics;  Laterality: Right;   IRRIGATION AND DEBRIDEMENT ELBOW Right 04/24/2023   Procedure: IRRIGATION AND DEBRIDEMENT ELBOW;  Surgeon: Roby Lofts, MD;  Location: [facility] OR;  Service: Orthopedics;  Laterality: Right;   NASAL SINUS SURGERY  93, 97, 2010, 2006   NASAL SINUS SURGERY     1993, 1997, 2006, 2010   ORIF PELVIC FRACTURE Right 05/08/2023   Procedure: OPEN REDUCTION INTERNAL FIXATION (ORIF) PELVIC FRACTURE;  Surgeon: Roby Lofts, MD;  Location: [facility] OR;  Service: Orthopedics;  Laterality: Right;   ORIF PELVIC FRACTURE WITH PERCUTANEOUS SCREWS Bilateral 04/24/2023   Procedure: ORIF PELVIC FRACTURE WITH PERCUTANEOUS SCREWS;  Surgeon: Roby Lofts, MD;  Location: [facility] OR;  Service: Orthopedics;  Laterality: Bilateral;   Family History  Problem Relation Age of Onset   Colon cancer Father    Melanoma Mother    Melanoma  Sister    Social History:  reports that he quit smoking about 21 years ago. His smoking use included cigarettes. He started smoking about 41 years ago. He has a 30 pack-year smoking history. He has never used smokeless tobacco. He reports that he does not drink alcohol and does not use drugs. Allergies:  Allergies  Allergen Reactions   Morphine Other (See Comments)    Agitation/hallucinations   Medications Prior to Admission  Medication Sig Dispense Refill   acetaminophen (TYLENOL) 325 MG tablet Take 2 tablets (650 mg total) by mouth every 6 (six) hours. 150 tablet 0   albuterol (VENTOLIN HFA) 108 (90 Base) MCG/ACT inhaler Inhale 2 puffs into the lungs every 6 (six) hours as needed for wheezing or shortness of breath.     apixaban (ELIQUIS) 2.5 MG TABS tablet Take 1 tablet (2.5 mg total) by mouth 2 (two) times daily. 60 tablet 0   ascorbic acid (VITAMIN C) 500 MG tablet Take 1 tablet (500 mg total) by mouth daily. 30 tablet 0   carvedilol (COREG) 25 MG tablet Take 1 tablet (25 mg total) by mouth 2 (two) times daily with a meal. 60 tablet 0   ezetimibe (ZETIA) 10 MG tablet Take 1 tablet (10 mg total) by mouth at bedtime. 30 tablet 0  furosemide (LASIX) 20 MG tablet Take 1 tablet (20 mg total) by mouth 2 (two) times daily. 60 tablet 0   gabapentin (NEURONTIN) 300 MG capsule Take 1 capsule (300 mg total) by mouth 3 (three) times daily. 90 capsule 0   levocetirizine (XYZAL) 5 MG tablet SMARTSIG:1 Tablet(s) By Mouth Every Evening     LORazepam (ATIVAN) 1 MG tablet Take 1 tablet (1 mg total) by mouth at bedtime.     methocarbamol (ROBAXIN) 750 MG tablet Take 1 tablet (750 mg total) by mouth every 8 (eight) hours. 90 tablet 0   Multiple Vitamin (MULTIVITAMIN) capsule Take 1 capsule by mouth daily.      omeprazole (PRILOSEC) 40 MG capsule Take 40 mg by mouth every morning.     Oxycodone HCl 10 MG TABS Take 1 tablet (10 mg total) by mouth every 4 (four) hours as needed (pain level 7 or higher). 35  tablet 0   pantoprazole (PROTONIX) 40 MG tablet Take 1 tablet (40 mg total) by mouth daily. 30 tablet 0   potassium chloride SA (KLOR-CON M) 20 MEQ tablet Take 1 tablet (20 mEq total) by mouth 2 (two) times daily. 60 tablet 0   pravastatin (PRAVACHOL) 80 MG tablet Take 80 mg by mouth at bedtime.      sertraline (ZOLOFT) 25 MG tablet Take 1 tablet (25 mg total) by mouth daily. 30 tablet 0   spironolactone (ALDACTONE) 25 MG tablet Take 1 tablet (25 mg total) by mouth daily. 30 tablet 0   [EXPIRED] sulfamethoxazole-trimethoprim (BACTRIM DS) 800-160 MG tablet Take 1 tablet by mouth 2 (two) times daily.     tamsulosin (FLOMAX) 0.4 MG CAPS capsule Take 1 capsule (0.4 mg total) by mouth daily after supper. 30 capsule 0   traZODone (DESYREL) 50 MG tablet Take 50 mg by mouth at bedtime.     vitamin D3 (CHOLECALCIFEROL) 25 MCG tablet Take 2 tablets (2,000 Units total) by mouth daily. 30 tablet 0   Zinc Sulfate 220 (50 Zn) MG TABS Take 1 tablet (220 mg total) by mouth daily after supper. 30 tablet 0   doxycycline (VIBRA-TABS) 100 MG tablet Take 1 tablet (100 mg total) by mouth 2 (two) times daily. Continue thru 11/15 (Patient not taking: Reported on 06/27/2023)     polyethylene glycol powder (GLYCOLAX/MIRALAX) 17 GM/SCOOP powder Mix as directed and take 1 capful ( 17 g) by mouth 2 (two) times daily. (Patient not taking: Reported on 06/27/2023) 238 g 0   QUEtiapine (SEROQUEL) 100 MG tablet Take 1 tablet (100 mg total) by mouth at bedtime. (Patient not taking: Reported on 06/27/2023) 30 tablet 0      Home: Home Living Family/patient expects to be discharged to:: Private residence Living Arrangements: Spouse/significant other Available Help at Discharge: Family, Available 24 hours/day Type of Home: House Home Access: Stairs to enter [employer] of Steps: 3 Entrance Stairs-Rails: Can reach both Home Layout: Able to live on main level with bedroom/bathroom Bathroom Shower/Tub: Architectural technologist: Standard Bathroom Accessibility: Yes Home Equipment: Wheelchair - manual, Other (comment) (sliding board; unclear if pt has a RW) Additional Comments: Pt is an unreliable reporter. Pt report this day does not entirely match chart review. Home Living information taken from chart review as pt stated this day he has12 steps to enter at the front door and 19 at the back door. Pt further reports he is using a w/c at home and reports he is climbing stairs with no issues.   Functional History: Prior  Function Prior Level of Function : Independent/Modified Independent, Driving Mobility Comments: At baseline, pt is Independent without an AD. Per chart review, following fall and hospital admission in 04/2023, on 05/31/23, pt was completing sliding board transfers with Min assist. Pt unable to provide reliable report of funcitonal level just prior to this admission. ADLs Comments: At baseline, pt is Independent with ADLs, IADLs, and drives. Per chart review, following fall and hospital admission in 04/2023, on 05/31/23, pt was completing UB ADLs with Mod I and LB ADLs with Mod to Max assist. Pt unable to provide reliable report of funcitonal level just prior to this admission, but states wife and son currently assist with ADLs and IADLs.  Functional Status:  Mobility: Bed Mobility Overal bed mobility: Needs Assistance Bed Mobility: Rolling, Sidelying to Sit Rolling: Supervision Sidelying to sit: Supervision Supine to sit: Contact guard Sit to supine: Contact guard assist Sit to sidelying: Contact guard assist General bed mobility comments: use of bed features/rails to perform; Pt with legs hanging over side of bed and bed alarm sounding as PTA arriving to his room, pt's family present. Transfers Overall transfer level: Needs assistance Equipment used: Rolling walker (2 wheels), None Transfers: Sit to/from Stand, Bed to chair/wheelchair/BSC Sit to Stand: Min assist, Mod  assist Bed to/from chair/wheelchair/BSC transfer type:: Step pivot Squat pivot transfers: Min assist Step pivot transfers: Mod assist General transfer comment: cues for hand placement/technique; Pt requiring up to Mod assist to maintain balance during step-pivot transfers due to LLE weakness with single leg stance when lifting RLE to take steps; minA to rise from EOB/chair and modA for stand>sit due to poor eccentric control to sit. Ambulation/Gait General Gait Details: no ambulation per MD (ortho) pt WBAT Bil LE for transfers only. Pre-gait activities: standing BLE AROM: hip flexion with RW support x5 reps ea, limited due to LLE weakness/instability when lifting RLE    ADL: ADL Overall ADL's : Needs assistance/impaired Eating/Feeding: Set up, Sitting Grooming: Supervision/safety, Cueing for sequencing, Sitting Upper Body Bathing: Minimal assistance, Cueing for sequencing, Cueing for compensatory techniques, Sitting Lower Body Bathing: Maximal assistance, +2 for safety/equipment, Sit to/from stand, Sitting/lateral leans, Cueing for compensatory techniques, Cueing for sequencing Upper Body Dressing : Contact guard assist, Cueing for sequencing, Sitting Lower Body Dressing: Moderate assistance, Cueing for safety, Cueing for sequencing, Sit to/from stand, Sitting/lateral leans Toilet Transfer: Minimal assistance, Moderate assistance, Cueing for safety, Cueing for sequencing, BSC/3in1, Rolling walker (2 wheels) (step-pivot transfers; cues for hand placement/technique) Toilet Transfer Details (indicate cue type and reason): Pt requiring Min to Mod assist to maintain balance during step-pivot transfers. Toileting- Clothing Manipulation and Hygiene: Maximal assistance, Cueing for safety, Cueing for sequencing, Cueing for compensatory techniques, Sit to/from stand General ADL Comments: Pt with continued decreased activity tolerance, fatiguing quickly during funcitonal tasks. Pt with noted improvements  in funcitonal level with transfers and LB dressing due to pt now being WBAT to B LE.  Cognition: Cognition Overall Cognitive Status: Impaired/Different from baseline Orientation Level: Oriented to person, Oriented to place Cognition Arousal: Alert Behavior During Therapy: WFL for tasks assessed/performed Overall Cognitive Status: Impaired/Different from baseline Area of Impairment: Memory, Safety/judgement, Awareness, Attention, Following commands, Problem solving Orientation Level: Disoriented to, Time, Situation (Pt oriented to self, wife, and being in the hospital. Pt not oriented to reason for hospitalization.) Current Attention Level: Sustained Memory: Decreased short-term memory, Decreased recall of precautions Following Commands: Follows one step commands consistently, Follows multi-step commands consistently, Follows multi-step commands with increased time Safety/Judgement: Decreased awareness  of safety, Decreased awareness of deficits Awareness: Emergent Problem Solving: Slow processing, Decreased initiation, Difficulty sequencing, Requires verbal cues General Comments: Pt with decreased insight into deficits, occasionally stating "I can walk". Pt reoriented to ortho MD orders and WBAT on BLE for transfers only at this time, pt not yet cleared for longer distance ambulation until MD clears him for this.  Physical Exam: Blood pressure 119/71, pulse 77, temperature 97.8 F (36.6 C), temperature source Oral, resp. rate 16, height 5\' 10"  (1.778 m), weight 73.4 kg, SpO2 100%. Physical Exam Vitals and nursing note reviewed. Exam conducted with a chaperone present.  Constitutional:      Appearance: Normal appearance.     Comments: Pt supine in bed; awake, alert, off airborne precautions and sats 99% on RA; appears mildly encephalopathic, NAD-   HENT:     Head: Normocephalic and atraumatic.     Nose: Nose normal.     Mouth/Throat:     Mouth: Mucous membranes are dry.     Pharynx:  Oropharynx is clear.  Eyes:     General:        Right eye: No discharge.        Left eye: No discharge.     Extraocular Movements: Extraocular movements intact.  Cardiovascular:     Rate and Rhythm: Normal rate and regular rhythm.     Pulses: Normal pulses.     Heart sounds: Normal heart sounds.  Pulmonary:     Effort: Pulmonary effort is normal. No respiratory distress.     Breath sounds: Normal breath sounds. No wheezing, rhonchi or rales.  Abdominal:     General: Bowel sounds are normal. There is no distension.     Palpations: Abdomen is soft.     Tenderness: There is no abdominal tenderness.  Musculoskeletal:        General: Normal range of motion.     Cervical back: Neck supple. No rigidity.     Comments: Ue's 5/5 in B/L UE"s  RLE- 4/5 in HF except for R DF 2/5 LLE- 5-/5 throughout-  Pt wasn't clear when got weak in R DF, but admits it's chronic  Skin:    General: Skin is warm and dry.     Comments: A few bruises on Ue's   Neurological:     Mental Status: He is alert.     Comments: Ox2-3 however denies any cognitive issues, even though admitted for AMS; also perseverative on topic of exact time coming to rehab; what "the plan was"- and kept saying Hotel instead of hospital  Sounds a little confused on exam-  Intact to light touch in all 4 extremities Perseverates severely.   Psychiatric:     Comments: Frustrated about CIR- and not having exact details     No results found for this or any previous visit (from the past 48 hours). No results found.    Blood pressure 119/71, pulse 77, temperature 97.8 F (36.6 C), temperature source Oral, resp. rate 16, height 5\' 10"  (1.778 m), weight 73.4 kg, SpO2 100%.  Medical Problem List and Plan: 1. Functional deficits secondary to acute encephalopathy  -patient may  shower  -ELOS/Goals: 7-10 days- pt wants more more than 5 days - min A to Supervision  Admit to CIR- con't CIR PT, OT and SLP  2.   Antithrombotics: -DVT/anticoagulation:  Pharmaceutical: Eliquis 2.5 mg BID  -antiplatelet therapy: none  3. Pain Management: Tylenol as needed  4. Mood/Behavior/Sleep: LCSW to evaluate and provide emotional support  -  continue sertraline 25 mg daily  -antipsychotic agents: n/a  5. Neuropsych/cognition: This patient is not quite capable of making decisions on his  own behalf.  6. Skin/Wound Care: Routine skin care checks   7. 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 Jena Gauss  -is now WBAT on B/L LE's- is walking 14: COVID-positive pneumonia: Continue dexamethasone 6 mg daily through 12/17 - is off airborne precautions.  15. Limited DNR 16. Insomnia- was on Seroquel last rehab stay- will restart and melatonin.    Milinda Antis, PA-C 07/03/2023  I have personally performed a face to face diagnostic evaluation of this patient and formulated the key components of the plan.  Additionally, I have personally reviewed laboratory data, imaging studies, as well as relevant notes and concur with the physician assistant's documentation above.   The patient's status has not changed from the original H&P.  Any changes in documentation from the acute care chart have been noted above.

## 2023-07-03 NOTE — Progress Notes (Signed)
HD#4 Subjective:  Patient Summary: Louis Juarez. is a 82 y.o. male with past history of dilated cardiomyopathy, AICD, hypertension, hyperlipidemia, diabetes presents to the ED evaluation of acute mental status changes, and admitted for acute metabolic encephalopathy.  Overnight Events: NAEO   Interim History: Patient was evaluated at bedside. Patient notes restless night but no other concerns. Denies any chest pain, shortness of breath, abdominal pain, or other signs or symptoms.   Vital signs in last 24 hours: Vitals:   07/02/23 0339 07/02/23 0958 07/02/23 1645 07/02/23 1948  BP: (!) 154/79 (!) 154/76 (!) 162/78 (!) 144/64  Pulse: 60 66 68 60  Resp: 18 18 18 16   Temp: 97.7 F (36.5 C) (!) 97.4 F (36.3 C) 97.9 F (36.6 C) 97.6 F (36.4 C)  TempSrc: Oral Oral Oral Oral  SpO2: 97% 95% 97% 93%  Weight:      Height:       Supplemental O2: Room Air   Physical Exam:   Physical Exam Constitutional:      Appearance: Normal appearance.  HENT:     Head: Normocephalic and atraumatic.  Cardiovascular:     Rate and Rhythm: Normal rate and regular rhythm.     Pulses: Normal pulses.     Heart sounds: Normal heart sounds.  Pulmonary:     Effort: Pulmonary effort is normal.     Breath sounds: Normal breath sounds.  Abdominal:     General: Abdomen is flat. Bowel sounds are normal.     Palpations: Abdomen is soft.  Musculoskeletal:     Right lower leg: No edema.     Left lower leg: No edema.  Neurological:     General: No focal deficit present.     Mental Status: He is alert and oriented to person, place, and time.    Filed Weights   06/29/23 1149  Weight: 73.4 kg    Intake/Output Summary (Last 24 hours) at 07/03/2023 0636 Last data filed at 07/02/2023 1700 Gross per 24 hour  Intake 640 ml  Output --  Net 640 ml   Net IO Since Admission: -1,014 mL [07/03/23 0636]  No results for input(s): "GLUCAP" in the last 72 hours.   Pertinent Labs:    Latest Ref Rng  & Units 06/30/2023    4:16 AM 06/29/2023    4:03 AM 06/28/2023    8:43 AM  CBC  WBC 4.0 - 10.5 K/uL 9.0  10.7  5.8   Hemoglobin 13.0 - 17.0 g/dL 66.4  40.3  47.4   Hematocrit 39.0 - 52.0 % 36.2  38.4  39.4   Platelets 150 - 400 K/uL 246  263  261        Latest Ref Rng & Units 06/30/2023    4:16 AM 06/29/2023    4:03 AM 06/28/2023    8:43 AM  CMP  Glucose 70 - 99 mg/dL 259  563  875   BUN 8 - 23 mg/dL 17  19  15    Creatinine 0.61 - 1.24 mg/dL 6.43  3.29  5.18   Sodium 135 - 145 mmol/L 137  138  138   Potassium 3.5 - 5.1 mmol/L 3.7  3.6  4.3   Chloride 98 - 111 mmol/L 102  103  102   CO2 22 - 32 mmol/L 26  25  25    Calcium 8.9 - 10.3 mg/dL 9.0  9.6  9.5     Assessment/Plan:   Principal Problem:   Acute metabolic encephalopathy  Active Problems:   NICM (nonischemic cardiomyopathy) (HCC)   Acute hypoxic respiratory failure (HCC)   COVID-19   Patient Summary: Louis Juarez. is a 82 y.o. male with past history of dilated cardiomyopathy, AICD, hypertension, hyperlipidemia, diabetes presents to the ED evaluation of acute mental status changes, and admitted for acute metabolic encephalopathy.  #Altered mental status #Acute metabolic encephalopathy  Remains and oriented x 3 and medically stable for discharge to inpatient rehab. Afebrile and satting well on room air.  - Continue sertraline 25 mg - Continue lorazepam 1 mg at bedtime  - Continue holding oxycodone, gabapentin and Robaxin. - Holding all other central acting medicines.  #Acute hypoxic respiratory failure #COVID infection Resolved, satting on room air.  Will continue on Decadron to discharge. - Airborne/contact precautions - Continue to monitor  #Chronic systolic CHF/nonischemic cardiomyopathy. Normotensive, not volume overloaded on exam. Will continue holding his home Lasix. - Continue Coreg 25 mg - Continue spironolactone 25 mg - Holding Lasix   #Pelvic ring fracture s/p percutaneous fixation  Dr. Jena Gauss  ok with WBAT LLE transfers only on 12/9, awaiting imaging for further updates on RLE - Follow-up on recommendations for the right lower extremity.   Right hip pain. Stable, has intermittent lidocaine patch as needed.  Stable chronic medical conditions.   #Hyperlipidemia - Statin 80 mg - Ezetimibe 10 mg.   #GERD - Pantoprazole   Diet: Regular diet VTE: apixaban (ELIQUIS) tablet 2.5 mg Start: 06/28/23 1000apixaban (ELIQUIS) tablet 2.5 mg  Code: DNR PT/OT: Pending Anticipated discharge to CIR pending insurance approval.  Morrie Sheldon, MD 07/03/2023, 6:36 AM [facility]  Please contact the on call pager after 5 pm and on weekends at [phone].

## 2023-07-03 NOTE — TOC CM/SW Note (Signed)
Transition of Care [facility]) - Inpatient Brief Assessment   Patient Details  Name: Louis Juarez. MRN: [id number] Date of Birth: 06-Feb-1941  Transition of Care [facility]) CM/SW Contact:    Mearl Latin, LCSW Phone Number: 07/03/2023, 8:51 AM   Clinical Narrative: CIR awaiting bed availability. No further TOC needs identified at this time.    Transition of Care Asessment: Insurance and Status: Insurance coverage has been reviewed Patient has primary care physician: Yes Home environment has been reviewed: Publishing rights manager Home Services: No current home services Social Drivers of Health Review: SDOH reviewed no interventions necessary Readmission risk has been reviewed: Yes Transition of care needs: no transition of care needs at this time

## 2023-07-04 ENCOUNTER — Encounter (HOSPITAL_COMMUNITY): Payer: Self-pay | Admitting: Physical Medicine & Rehabilitation

## 2023-07-04 ENCOUNTER — Other Ambulatory Visit: Payer: Self-pay

## 2023-07-04 ENCOUNTER — Inpatient Hospital Stay (HOSPITAL_COMMUNITY)
Admission: AD | Admit: 2023-07-04 | Discharge: 2023-07-13 | DRG: 945 | Disposition: A | Discharge status: Home Health | Payer: BC Managed Care – PPO | Source: Intra-hospital | Attending: Physical Medicine & Rehabilitation | Admitting: Physical Medicine & Rehabilitation

## 2023-07-04 DIAGNOSIS — Z85828 Personal history of other malignant neoplasm of skin: Secondary | ICD-10-CM

## 2023-07-04 DIAGNOSIS — K219 Gastro-esophageal reflux disease without esophagitis: Secondary | ICD-10-CM | POA: Diagnosis present

## 2023-07-04 DIAGNOSIS — E785 Hyperlipidemia, unspecified: Secondary | ICD-10-CM | POA: Diagnosis present

## 2023-07-04 DIAGNOSIS — N400 Enlarged prostate without lower urinary tract symptoms: Secondary | ICD-10-CM | POA: Diagnosis not present

## 2023-07-04 DIAGNOSIS — R338 Other retention of urine: Secondary | ICD-10-CM | POA: Diagnosis present

## 2023-07-04 DIAGNOSIS — Z885 Allergy status to narcotic agent status: Secondary | ICD-10-CM

## 2023-07-04 DIAGNOSIS — Z7901 Long term (current) use of anticoagulants: Secondary | ICD-10-CM | POA: Diagnosis not present

## 2023-07-04 DIAGNOSIS — I5022 Chronic systolic (congestive) heart failure: Secondary | ICD-10-CM | POA: Diagnosis present

## 2023-07-04 DIAGNOSIS — G9340 Encephalopathy, unspecified: Principal | ICD-10-CM | POA: Diagnosis present

## 2023-07-04 DIAGNOSIS — Z66 Do not resuscitate: Secondary | ICD-10-CM | POA: Diagnosis present

## 2023-07-04 DIAGNOSIS — I420 Dilated cardiomyopathy: Secondary | ICD-10-CM | POA: Diagnosis present

## 2023-07-04 DIAGNOSIS — I110 Hypertensive heart disease with heart failure: Secondary | ICD-10-CM | POA: Diagnosis present

## 2023-07-04 DIAGNOSIS — R531 Weakness: Secondary | ICD-10-CM | POA: Diagnosis present

## 2023-07-04 DIAGNOSIS — W19XXXD Unspecified fall, subsequent encounter: Secondary | ICD-10-CM | POA: Diagnosis present

## 2023-07-04 DIAGNOSIS — Z79899 Other long term (current) drug therapy: Secondary | ICD-10-CM | POA: Diagnosis not present

## 2023-07-04 DIAGNOSIS — D72829 Elevated white blood cell count, unspecified: Secondary | ICD-10-CM | POA: Diagnosis not present

## 2023-07-04 DIAGNOSIS — M25571 Pain in right ankle and joints of right foot: Secondary | ICD-10-CM | POA: Diagnosis present

## 2023-07-04 DIAGNOSIS — R5381 Other malaise: Secondary | ICD-10-CM | POA: Diagnosis present

## 2023-07-04 DIAGNOSIS — Z95810 Presence of automatic (implantable) cardiac defibrillator: Secondary | ICD-10-CM

## 2023-07-04 DIAGNOSIS — S32810D Multiple fractures of pelvis with stable disruption of pelvic ring, subsequent encounter for fracture with routine healing: Secondary | ICD-10-CM

## 2023-07-04 DIAGNOSIS — J1282 Pneumonia due to coronavirus disease 2019: Secondary | ICD-10-CM | POA: Diagnosis present

## 2023-07-04 DIAGNOSIS — I5020 Unspecified systolic (congestive) heart failure: Secondary | ICD-10-CM | POA: Diagnosis not present

## 2023-07-04 DIAGNOSIS — U071 COVID-19: Secondary | ICD-10-CM | POA: Diagnosis present

## 2023-07-04 DIAGNOSIS — G4700 Insomnia, unspecified: Secondary | ICD-10-CM | POA: Diagnosis present

## 2023-07-04 DIAGNOSIS — I10 Essential (primary) hypertension: Secondary | ICD-10-CM | POA: Diagnosis not present

## 2023-07-04 DIAGNOSIS — Z87891 Personal history of nicotine dependence: Secondary | ICD-10-CM | POA: Diagnosis not present

## 2023-07-04 DIAGNOSIS — Z8616 Personal history of COVID-19: Secondary | ICD-10-CM | POA: Diagnosis not present

## 2023-07-04 DIAGNOSIS — G9341 Metabolic encephalopathy: Secondary | ICD-10-CM | POA: Diagnosis not present

## 2023-07-04 MED ORDER — PANTOPRAZOLE SODIUM 20 MG PO TBEC
20.0000 mg | DELAYED_RELEASE_TABLET | Freq: Every day | ORAL | Status: DC
  Administered 2023-07-05 – 2023-07-13 (×9): 20 mg via ORAL
  Filled 2023-07-04 (×9): qty 1

## 2023-07-04 MED ORDER — LIDOCAINE 5 % EX PTCH
1.0000 | MEDICATED_PATCH | Freq: Every day | CUTANEOUS | Status: DC
  Administered 2023-07-05 – 2023-07-10 (×6): 1 via TRANSDERMAL
  Filled 2023-07-04 (×9): qty 1

## 2023-07-04 MED ORDER — LIDOCAINE 5 % EX PTCH: 1.0000 | MEDICATED_PATCH | Freq: Every day | CUTANEOUS | Status: DC

## 2023-07-04 MED ORDER — SPIRONOLACTONE 25 MG PO TABS
25.0000 mg | ORAL_TABLET | Freq: Every day | ORAL | Status: DC
  Administered 2023-07-05 – 2023-07-13 (×9): 25 mg via ORAL
  Filled 2023-07-04 (×9): qty 1

## 2023-07-04 MED ORDER — FLEET ENEMA RE ENEM: 1.0000 | ENEMA | Freq: Once | RECTAL | Status: DC | PRN

## 2023-07-04 MED ORDER — SENNOSIDES-DOCUSATE SODIUM 8.6-50 MG PO TABS
1.0000 | ORAL_TABLET | Freq: Every evening | ORAL | Status: DC | PRN
  Administered 2023-07-12: 1 via ORAL
  Filled 2023-07-04: qty 1

## 2023-07-04 MED ORDER — ONDANSETRON HCL 4 MG PO TABS: 4.0000 mg | ORAL_TABLET | Freq: Four times a day (QID) | ORAL | Status: DC | PRN

## 2023-07-04 MED ORDER — QUETIAPINE FUMARATE 25 MG PO TABS
25.0000 mg | ORAL_TABLET | Freq: Every day | ORAL | Status: DC
  Administered 2023-07-04: 25 mg via ORAL
  Filled 2023-07-04: qty 1

## 2023-07-04 MED ORDER — PRAVASTATIN SODIUM 40 MG PO TABS
80.0000 mg | ORAL_TABLET | Freq: Every day | ORAL | Status: DC
  Administered 2023-07-04 – 2023-07-12 (×9): 80 mg via ORAL
  Filled 2023-07-04 (×9): qty 2

## 2023-07-04 MED ORDER — ACETAMINOPHEN 325 MG PO TABS
325.0000 mg | ORAL_TABLET | ORAL | Status: DC | PRN
  Administered 2023-07-06 – 2023-07-08 (×2): 650 mg via ORAL
  Filled 2023-07-04 (×2): qty 2

## 2023-07-04 MED ORDER — MELATONIN 5 MG PO TABS
5.0000 mg | ORAL_TABLET | Freq: Every day | ORAL | Status: DC
  Administered 2023-07-04: 5 mg via ORAL
  Filled 2023-07-04: qty 1

## 2023-07-04 MED ORDER — EZETIMIBE 10 MG PO TABS
10.0000 mg | ORAL_TABLET | Freq: Every day | ORAL | Status: DC
  Administered 2023-07-04 – 2023-07-12 (×9): 10 mg via ORAL
  Filled 2023-07-04 (×9): qty 1

## 2023-07-04 MED ORDER — SERTRALINE HCL 50 MG PO TABS
25.0000 mg | ORAL_TABLET | Freq: Every day | ORAL | Status: DC
  Administered 2023-07-05 – 2023-07-13 (×9): 25 mg via ORAL
  Filled 2023-07-04 (×9): qty 1

## 2023-07-04 MED ORDER — DEXAMETHASONE 4 MG PO TABS
6.0000 mg | ORAL_TABLET | Freq: Every day | ORAL | Status: AC
  Administered 2023-07-05: 6 mg via ORAL
  Filled 2023-07-04: qty 4

## 2023-07-04 MED ORDER — TAMSULOSIN HCL 0.4 MG PO CAPS
0.4000 mg | ORAL_CAPSULE | Freq: Every day | ORAL | Status: DC
  Administered 2023-07-04 – 2023-07-12 (×9): 0.4 mg via ORAL
  Filled 2023-07-04 (×9): qty 1

## 2023-07-04 MED ORDER — CARVEDILOL 25 MG PO TABS
25.0000 mg | ORAL_TABLET | Freq: Two times a day (BID) | ORAL | Status: DC
  Administered 2023-07-04 – 2023-07-13 (×18): 25 mg via ORAL
  Filled 2023-07-04 (×4): qty 1
  Filled 2023-07-04: qty 2
  Filled 2023-07-04 (×13): qty 1

## 2023-07-04 MED ORDER — ALUM & MAG HYDROXIDE-SIMETH 200-200-20 MG/5ML PO SUSP: 30.0000 mL | ORAL | Status: DC | PRN

## 2023-07-04 MED ORDER — LORAZEPAM 0.5 MG PO TABS
1.0000 mg | ORAL_TABLET | Freq: Every evening | ORAL | Status: DC | PRN
  Administered 2023-07-05 – 2023-07-12 (×5): 1 mg via ORAL
  Filled 2023-07-04 (×6): qty 2

## 2023-07-04 MED ORDER — GUAIFENESIN-DM 100-10 MG/5ML PO SYRP
10.0000 mL | ORAL_SOLUTION | Freq: Four times a day (QID) | ORAL | Status: DC | PRN
  Administered 2023-07-06: 10 mL via ORAL
  Filled 2023-07-04: qty 10

## 2023-07-04 MED ORDER — APIXABAN 2.5 MG PO TABS
2.5000 mg | ORAL_TABLET | Freq: Two times a day (BID) | ORAL | Status: DC
  Administered 2023-07-04 – 2023-07-13 (×18): 2.5 mg via ORAL
  Filled 2023-07-04 (×18): qty 1

## 2023-07-04 MED ORDER — BISACODYL 5 MG PO TBEC
5.0000 mg | DELAYED_RELEASE_TABLET | Freq: Every day | ORAL | Status: DC | PRN
Start: 2023-07-04 — End: 2023-07-13

## 2023-07-04 MED ORDER — ONDANSETRON HCL 4 MG/2ML IJ SOLN: 4.0000 mg | Freq: Four times a day (QID) | INTRAMUSCULAR | Status: DC | PRN

## 2023-07-04 NOTE — Progress Notes (Signed)
NT helped patient into recliner to change bed linen.  NT was sweeping floor and patient attempted to get out of recliner without assistance.  Patient has attempted to get out of bed multiple times since admission, despite re-orientation, frequent staff checks, etc.  Patient fell onto safety floor mat. Vital signs stable and no visible injuries.  MD notified.  Rehab notified.  Patient's spouse, Louis Juarez, notified and voiced understanding.

## 2023-07-04 NOTE — Discharge Instructions (Addendum)
You were hospitalized for confusion likely in setting of COVID infection and certain medications.  I am happy to see that you have improved and symptoms have resolved.  You completed steroid course for COVID infection.  You will continue therapy at inpatient rehab to improve your strength.  Thank you for allowing [place] to be part of your care.   Please make sure to arrange follow-up with your primary care provider.  Please note these changes made to your medications:  *Please STOP taking:  -Stop Eliquis (completed course) -Stop Bactrim (completed course) -Stop oxycodone, Robaxin, gabapentin, trazodone (follow-up with your provider regarding restarting these medications)

## 2023-07-04 NOTE — Plan of Care (Signed)
  Problem: Education: Goal: Knowledge of risk factors and measures for prevention of condition will improve Outcome: Progressing   Problem: Education: Goal: Knowledge of General Education information will improve Description: Including pain rating scale, medication(s)/side effects and non-pharmacologic comfort measures Outcome: Progressing   Problem: Health Behavior/Discharge Planning: Goal: Ability to manage health-related needs will improve Outcome: Progressing

## 2023-07-04 NOTE — Progress Notes (Signed)
Inpatient Rehabilitation Admission Medication Review by a Pharmacist  A complete drug regimen review was completed for this patient to identify any potential clinically significant medication issues.  High Risk Drug Classes Is patient taking? Indication by Medication  Antipsychotic No   Anticoagulant Yes Apixaban - VTE prophylaxis  Antibiotic No   Opioid No   Antiplatelet No   Hypoglycemics/insulin No   Vasoactive Medication Yes Carvedilol - hypertension Spironolactone - CHF  Chemotherapy No   Other Yes Dexamethasone x 1 more dose - recent COVID Ezetemibe, pravastatin - hyperlipidemia Lidocaine patch (lower back) - topical pain relief Pantoprazole - GERD prophylaxis Sertraline - mood Tamsulosin - urinary retention  PRNs: Acetaminophen - mild pain Maalox - indigestion Diphenhydramine - itching Lorazepam - anxiety Ondansetron - nausea, vomiting Bisacodyl, senna-docusate Fleets enema - constipation     Type of Medication Issue Identified Description of Issue Recommendation(s)  Drug Interaction(s) (clinically significant)     Duplicate Therapy     Allergy     No Medication Administration End Date  Apixaban for VTE prophylaxis. Initially scheduled to stop 07/04/23. To continue. New end date to be determined at or prior to discharge from CIR.  Incorrect Dose     Additional Drug Therapy Needed     Significant med changes from prior encounter (inform family/care partners about these prior to discharge). New lidocaine patch   Off Vitamin C, Vitamin D, MVI, Zinc.   Paused: furosemide, potassium chloride, gabapentin, methocarbamol, trazodone, prn Oxycodone. Communicate changes with patient/family prior to discharge.  Resume supplements while on CIR if warranted, or at discharge.  Continued assessment to determine whether to resume or discontinue and of the paused medications.  Other       Clinically significant medication issues were identified that warrant physician  communication and completion of prescribed/recommended actions by midnight of the next day:  No  Name of provider notified for urgent issues identified:   Provider Method of Notification:    Pharmacist comments:  - Apixaban for VTE prophylaxis was previously scheduled to stop on 07/04/23, 30 days post-op ORIF pelvic fracture.  To continue on CIR.  Time spent performing this drug regimen review (minutes):  [address]   Dennie Fetters, [place] 07/04/2023 5:18 PM

## 2023-07-04 NOTE — Progress Notes (Signed)
Signed     Expand All Collapse All PMR Admission Coordinator Pre-Admission Assessment   Patient: Louis Juarez. is an 82 y.o., male MRN: [id number] DOB: Jul 01, 1941 Height: 5\' 10"  (177.8 cm) Weight: 73.4 kg   Insurance Information HMO:     PPO:   yes   PCP:      IPA:      80/20:      OTHER:  PRIMARY: [employer]       Policy#: [id number]       Subscriber: [name] CM Name: TBD      Phone#: [phone]     Fax#: [phone] Pre-Cert#: [id number] approved from 07/02/23 to 07/08/23      Employer:  Benefits:  Phone #: [phone]     Name: Ellison Hughs Date: 08/13/2022-07/20/9998 Deductible: $3,200 ($3,200 met) OOP Max: $4,600 ($4,600 met)  CIR: 90% coverage, 10% co-insurance SNF: 90% coverage, 10% co-insurance Outpatient: 90% coverage, 10% co-insurance Home Health: 90% coverage, 10% co-insurance DME: 90% coverage, 10% co-insurance   SECONDARY: Medicare A and B      Policy#: [id number]      Phone#:    Artist:       Phone#:    The Engineer, materials Information Summary" for patients in Inpatient Rehabilitation Facilities with attached "Privacy Act Statement-Health Care Records" was provided and verbally reviewed with: Patient   Emergency Contact Information Contact Information       Name Relation Home Work Mobile    [name],sandra Spouse     [phone]         Other Contacts   None on File        Current Medical History  Patient Admitting Diagnosis: Debility, COVID History of Present Illness:   Pt is a 82 y.o. male with PMH includes AICD, HTN, DM, CHF, colon CA, fall (with admit 04/24/23 with R pelvic ORIF & R elbow I&D, d/c to AIR 10/7-11/11/24 following acute admission 10/4-10/7 for hemorrhagic shock from pelvic fractures & aan olecranon fracture . He was brought to [facility] 06/27/23 with somnolence . PT. Went home AIR previously at min-mod A for scoot transfers via wheelchair (due to NWB status). Family states that on  Thursday, 12/5,  he did slide off the bed while urinating landing on the right shoulder and elbow.  They went to urgent care for imaging which was negative and discharged home. He has developed a drainage from right elbow, and family spoke to Dr. Jena Gauss, who prescribed Bactrim. On 12/7 Pt. Was admitted for workup for acute metabolic encepahlopathy, (+) COVID. Head CT negative for acute abnormality.  Pt. Was seen by PT/OT/SLP and they recommend CIR to assist return to PLOF.       Patient[facility] has been reviewed by the rehabilitation admission coordinator and physician.   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 Bilateral pleural effusions,  Question mild subpleural reticulation, indicative of fibrosis.  Coronary artery calcification. 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 Wall thickness was increased in a pattern of mild LVH.- Aortic valve: AV is thckened, calcified with no signifi stenosis.The atrium was severely dilated.: Systolic function was moderately reduced.The atrium was mildly dilated.PA peak pre   Nonischemic cardiomyopathy (HCC)     Palpitations     Pneumonia     PVC (premature ventricular contraction) 07/02/2011   PVC's (premature ventricular contractions)     Wheezing 04/29/2011    Sinus ct 05/2011:    Wheezing            Has the patient had major surgery during 100 days prior to admission? Yes   Family History   family history includes Colon cancer in his father; Melanoma in his mother and sister.   Current Medications  Current Medications    Current Facility-Administered Medications:    acetaminophen (TYLENOL) tablet 650 mg, 650 mg, Oral, Q6H PRN, 650 mg at 07/04/23 0806 **OR** acetaminophen (TYLENOL) suppository 650 mg, 650 mg, Rectal, Q6H PRN, Rana Snare, DO   apixaban (ELIQUIS) tablet 2.5 mg, 2.5 mg, Oral, BID, Rana Snare, DO, 2.5 mg at 07/04/23 2952   carvedilol (COREG) tablet 25 mg, 25 mg, Oral, BID WC, Koomson, Julius, MD, 25 mg at 07/04/23 0806   dexamethasone (DECADRON) tablet 6 mg, 6 mg, Oral, Daily, Rana Snare, DO, 6 mg at 07/04/23 8413   ezetimibe (ZETIA) tablet 10 mg, 10 mg, Oral, QHS, Rana Snare, DO, 10 mg at 07/03/23 2121   lidocaine (LIDODERM) 5 % 1 patch, 1 patch, Transdermal, Daily, Rana Snare, DO, 1 patch at 07/04/23 0806   LORazepam (ATIVAN) tablet 1 mg, 1 mg, Oral, QHS PRN, Laretta Bolster, MD, 1 mg at 07/03/23 1941   pantoprazole (PROTONIX) EC tablet 20 mg, 20 mg, Oral, Daily, Rana Snare, DO, 20 mg at 07/04/23 2440   pravastatin (PRAVACHOL) tablet 80 mg, 80 mg, Oral, QHS, Rana Snare, DO, 80 mg at 07/03/23 2121   senna-docusate (Senokot-S) tablet  1 tablet, 1 tablet, Oral, QHS PRN, Rana Snare, DO   sertraline (ZOLOFT) tablet 25 mg, 25 mg, Oral, Daily, Rana Snare, DO, 25 mg at 07/04/23 1027   spironolactone (ALDACTONE) tablet 25 mg, 25 mg, Oral, Daily, Koomson, Julius, MD, 25 mg at 07/04/23 0806   tamsulosin (FLOMAX) capsule 0.4 mg, 0.4 mg, Oral, QPC supper, Rana Snare, DO, 0.4 mg at 07/03/23 1648     Patients Current Diet:  Diet Order                  DIET DYS 3 Room service appropriate? No; Fluid consistency: Thin  Diet effective now                         Precautions / Restrictions Precautions Precautions: Fall Precaution Comments: Airborne/Contact precs Restrictions Weight Bearing Restrictions Per Provider Order: No RUE Weight Bearing Per Provider Order: Weight bearing as tolerated RLE Weight Bearing Per Provider Order: Weight bearing as tolerated LLE Weight Bearing Per Provider Order: Weight bearing as tolerated Other Position/Activity Restrictions: "Patient okay to advance to BLE weightbearing for transfers as long as he is not having pain.  If patient develops pain when weightbearing, should go back to nonweightbearing" per Ortho note 06/30/23 by Thyra Breed, PA-C.    Has the patient had 2 or more falls or a fall with injury in the past year? Yes   Prior Activity Level Limited Community (1-2x/wk): pt. went out for appts   Prior Functional Level Self Care: Did the patient need help bathing, dressing, using the toilet or eating? Needed some help   Indoor Mobility: Did the patient need assistance with walking from room to room (with or without device)? Needed some help   Stairs: Did the patient need assistance with internal or external stairs (with or without device)? Needed some help   Functional Cognition: Did the patient need help planning regular tasks such as shopping or remembering to take medications? Dependent   Patient Information Are you of Hispanic, Latino/a,or Spanish origin?: A. No,  not of Hispanic, Latino/a, or Spanish origin What is your race?: A. White Do you need or want an interpreter to communicate with a doctor or health care staff?: 0. No   Patient's Response To:  Health Literacy and Transportation Is the patient able to respond to health literacy and transportation needs?: Yes Health Literacy - How often do you need to have someone help you when you read instructions, pamphlets, or other written material from your doctor or pharmacy?: Never In the past 12 months, has lack of transportation kept you from medical appointments or from getting medications?: No In the past 12 months, has lack of transportation kept you from meetings, work, or from getting things needed for daily living?: No   Journalist, newspaper / Equipment Home Equipment: Wheelchair - manual, Other (comment) (sliding board; unclear if pt has a RW)   Prior Device Use: Indicate devices/aids used by the patient prior to current illness, exacerbation or injury? Manual wheelchair   Current Functional Level Cognition   Overall Cognitive Status: Impaired/Different from baseline Current Attention Level: Sustained Orientation Level: Oriented to person Following Commands: Follows one step commands consistently, Follows multi-step commands consistently, Follows multi-step commands with increased time Safety/Judgement: Decreased awareness of safety, Decreased awareness of deficits General Comments: Pt with decreased insight into deficits, occasionally stating "I can walk". Pt reoriented to ortho MD orders and WBAT on BLE for transfers only at this time, pt not yet cleared for longer distance ambulation until MD clears him for this.    Extremity Assessment (includes Sensation/Coordination)   Upper Extremity Assessment: RUE deficits/detail, LUE deficits/detail RUE Deficits / Details: generalized weakness, ROM WFL; mildly decreased fine motor coordination RUE Sensation: WNL RUE Coordination: decreased  fine motor LUE Deficits / Details: Overall strength and ROM WFL; decreased fine motor coordination LUE Sensation: WNL LUE Coordination: decreased fine motor  Lower Extremity Assessment: Defer to PT evaluation RLE Deficits / Details: 1/5 ankle DF noted (pt reports this has been since fall); gross hip and knee strength >/ 3/5 LLE Deficits / Details: gross  LLE strength > 3/5     ADLs   Overall ADL's : Needs assistance/impaired Eating/Feeding: Set up, Sitting Grooming: Supervision/safety, Cueing for sequencing, Sitting Upper Body Bathing: Minimal assistance, Cueing for sequencing, Cueing for compensatory techniques, Sitting Lower Body Bathing: Maximal assistance, +2 for safety/equipment, Sit to/from stand, Sitting/lateral leans, Cueing for compensatory techniques, Cueing for sequencing Upper Body Dressing : Contact guard assist, Cueing for sequencing, Sitting Lower Body Dressing: Moderate assistance, Cueing for safety, Cueing for sequencing, Sit to/from stand, Sitting/lateral leans Toilet Transfer: Minimal assistance, Moderate assistance, Cueing for safety, Cueing for sequencing, BSC/3in1, Rolling walker (2 wheels) (step-pivot transfers; cues for hand placement/technique) Toilet Transfer Details (indicate cue type and reason): Pt requiring Min to Mod assist to maintain balance during step-pivot transfers. Toileting- Clothing Manipulation and Hygiene: Maximal assistance, Cueing for safety, Cueing for sequencing, Cueing for compensatory techniques, Sit to/from stand General ADL Comments: Pt with continued decreased activity tolerance, fatiguing quickly during funcitonal tasks. Pt with noted improvements in funcitonal level with transfers and LB dressing due to pt now being WBAT to B LE.     Mobility   Overal bed mobility: Needs Assistance Bed Mobility: Rolling, Sidelying to Sit Rolling: Supervision Sidelying to sit: Supervision Supine to sit: Contact guard Sit to supine: Contact guard  assist Sit to sidelying: Contact guard assist General bed mobility comments: use of bed features/rails to perform; Pt with legs hanging over side of bed and bed alarm sounding as PTA arriving to his room, pt's family present.     Transfers   Overall transfer level: Needs assistance Equipment used: Rolling walker (2 wheels), None Transfers: Sit to/from Stand, Bed to chair/wheelchair/BSC Sit to Stand: Min assist, Mod assist Bed to/from chair/wheelchair/BSC transfer type:: Step pivot Squat pivot transfers: Min assist Step pivot transfers: Mod assist General transfer comment: cues for hand placement/technique; Pt requiring up to Mod assist to maintain balance during step-pivot transfers due to LLE weakness with single leg stance when lifting RLE to take steps; minA to rise from EOB/chair and modA for stand>sit due to poor eccentric control to sit.     Ambulation / Gait / Stairs / Wheelchair Mobility   Ambulation/Gait General Gait Details: no ambulation per MD (ortho) pt WBAT Bil LE for transfers only. Pre-gait activities: standing BLE AROM: hip flexion with RW support x5 reps ea, limited due to LLE weakness/instability when lifting RLE     Posture / Balance Dynamic Sitting Balance Sitting balance - Comments: prolonged static sitting at EOB with supervision Balance Overall balance assessment: Needs assistance Sitting-balance support: No upper extremity supported, Feet supported Sitting balance-Leahy Scale: Fair Sitting balance - Comments: prolonged static sitting at EOB with supervision Postural control: Left lateral lean (able to self-correct with verbal cues) Standing balance support: Bilateral upper extremity supported, During functional activity, Reliant on assistive device for balance Standing balance-Leahy Scale: Poor Standing balance comment: heavy reliance on RW support     Special needs/care consideration Skin Abrasion: elbow/right; Ecchymosis: arm/bilateral     Previous Home  Environment (from acute therapy documentation) Living Arrangements: Spouse/significant other Available Help at Discharge: Family, Available 24 hours/day Type of Home: House Home Layout: Able to live on main level with bedroom/bathroom Home Access: Stairs to enter Entrance Stairs-Rails: Can reach both Entrance Stairs-Number of Steps: 3 Bathroom Shower/Tub: Health visitor: Standard Bathroom Accessibility: Yes How Accessible: Accessible via walker Additional Comments: Pt is an unreliable reporter. Pt report this day does not entirely match chart review. Home Living information taken from chart review  as pt stated this day he has12 steps to enter at the front door and 19 at the back door. Pt further reports he is using a w/c at home and reports he is climbing stairs with no issues.   Discharge Living Setting Plans for Discharge Living Setting: Patient's home Type of Home at Discharge: House Discharge Home Layout: Able to live on main level with bedroom/bathroom Discharge Home Access: Stairs to enter Entrance Stairs-Rails: Can reach both Entrance Stairs-Number of Steps: 3 Discharge Bathroom Shower/Tub: Walk-in shower Discharge Bathroom Toilet: Standard Discharge Bathroom Accessibility: Yes How Accessible: Accessible via walker   Social/Family/Support Systems Patient Roles: Spouse Contact Information: [phone] Anticipated Caregiver: Dois Davenport (wife) Ability/Limitations of Caregiver: can provide min A Caregiver Availability: 24/7 Discharge Plan Discussed with Primary Caregiver: Yes Is Caregiver In Agreement with Plan?: No Does Caregiver/Family have Issues with Lodging/Transportation while Pt is in Rehab?: No   Goals Patient/Family Goal for Rehab: PT/OT min A to supervision w/c level; SLP supervision Expected length of stay: 7-10 days Pt/Family Agrees to Admission and willing to participate: Yes Program Orientation Provided & Reviewed with Pt/Caregiver Including Roles   & Responsibilities: Yes   Decrease burden of Care through IP rehab admission: not anticipated   Possible need for SNF placement upon discharge: not anticipated   Patient Condition: I have reviewed medical records from [facility], spoken with CM, and patient. I met with patient at the bedside for inpatient rehabilitation assessment.  Patient will benefit from ongoing PT, OT, and SLP, can actively participate in 3 hours of therapy a day 5 days of the week, and can make measurable gains during the admission.  Patient will also benefit from the coordinated team approach during an Inpatient Acute Rehabilitation admission.  The patient will receive intensive therapy as well as Rehabilitation physician, nursing, social worker, and care management interventions.  Due to bladder management, bowel management, safety, skin/wound care, disease management, medication administration, pain management, and patient education the patient requires 24 hour a day rehabilitation nursing.  The patient is currently min A-mod A with mobility and basic ADLs.  Discharge setting and therapy post discharge at home with home health is anticipated.  Patient has agreed to participate in the Acute Inpatient Rehabilitation Program and will admit today.   Preadmission Screen Completed By:  Trish Mage, 07/04/2023 9:41 AM ______________________________________________________________________   Discussed status with Dr. Berline Chough on 07/04/23  at 9:41 AM and received approval for admission today.   Admission Coordinator:  Trish Mage, RN, time 9:41 AM/Date 07/04/23     Assessment/Plan: Diagnosis: Encephalopathy and COVID Does the need for close, 24 hr/day Medical supervision in concert with the patient's rehab needs make it unreasonable for this patient to be served in a less intensive setting? Yes Co-Morbidities requiring supervision/potential complications: COVID- on airborne precautions; pelvic ring fx WBAT  LLE only; HTN; dilated CMO; AICD; HLD; DM Due to bladder management, bowel management, safety, skin/wound care, disease management, medication administration, pain management, and patient education, does the patient require 24 hr/day rehab nursing? Yes Does the patient require coordinated care of a physician, rehab nurse, PT, OT, and SLP to address physical and functional deficits in the context of the above medical diagnosis(es)? Yes Addressing deficits in the following areas: balance, endurance, locomotion, strength, transferring, bowel/bladder control, bathing, dressing, feeding, grooming, toileting, and cognition Can the patient actively participate in an intensive therapy program of at least 3 hrs of therapy 5 days a week? Yes The potential for patient to  make measurable gains while on inpatient rehab is good Anticipated functional outcomes upon discharge from inpatient rehab: supervision and min assist PT, w/vc level supervision and min assist OT, supervision SLP Estimated rehab length of stay to reach the above functional goals is: 7-10 days Anticipated discharge destination: Home 10. Overall Rehab/Functional Prognosis: good     MD Signature:

## 2023-07-04 NOTE — Evaluation (Signed)
Occupational Therapy Assessment and Plan  Patient Details  Name: Louis Juarez. 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 Bilateral pleural effusions,  Question mild subpleural reticulation, indicative of fibrosis.  Coronary artery calcification. 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 Wall thickness was increased in a pattern of mild LVH.- Aortic valve: AV is thckened, calcified with no signifi stenosis.The atrium was severely dilated.:  Systolic function was moderately reduced.The atrium was mildly dilated.PA peak pre   Nonischemic cardiomyopathy (HCC)    Palpitations    Pneumonia    PVC (premature ventricular contraction) 07/02/2011   PVC's (premature ventricular contractions)    Wheezing 04/29/2011   Sinus ct 05/2011:    Wheezing    Past Surgical History:  Past Surgical History:  Procedure Laterality Date   BI-VENTRICULAR IMPLANTABLE CARDIOVERTER DEFIBRILLATOR N/A 02/13/2012   Procedure: BI-VENTRICULAR IMPLANTABLE CARDIOVERTER DEFIBRILLATOR  (CRT-D);  Surgeon: Hillis Range, MD;  Location: [facility] CATH LAB;  Service: Cardiovascular;  Laterality: N/A;   BI-VENTRICULAR IMPLANTABLE CARDIOVERTER DEFIBRILLATOR  (CRT-D)  02/13/2012   BI-VENTRICULAR IMPLANTABLE CARDIOVERTER DEFIBRILLATOR  (CRT-D)  05/03/2019   CHANGEOUT   BIV ICD GENERATOR CHANGEOUT N/A 05/03/2019   Procedure: BIV ICD GENERATOR CHANGEOUT;  Surgeon: Hillis Range, MD;  Location: [facility] INVASIVE CV LAB;  Service: Cardiovascular;  Laterality: N/A;   CARDIAC CATHETERIZATION  2013   CARDIAC DEFIBRILLATOR PLACEMENT  02/13/2012   SJM [employer] BIV ICD implanted by Dr Johney Frame   IRRIGATION AND DEBRIDEMENT ELBOW Right 05/08/2023   Procedure: IRRIGATION AND DEBRIDEMENT ELBOW;  Surgeon: Roby Lofts, MD;  Location: [facility] OR;  Service: Orthopedics;  Laterality: Right;   IRRIGATION AND DEBRIDEMENT ELBOW Right 04/24/2023   Procedure: IRRIGATION AND DEBRIDEMENT ELBOW;  Surgeon: Roby Lofts, MD;  Location: [facility] OR;  Service: Orthopedics;  Laterality: Right;   NASAL SINUS SURGERY  93, 97, 2010, 2006   NASAL SINUS SURGERY     1993, 1997, 2006, 2010   ORIF PELVIC FRACTURE Right  05/08/2023   Procedure: OPEN REDUCTION INTERNAL FIXATION (ORIF) PELVIC FRACTURE;  Surgeon: Roby Lofts, MD;  Location: [facility] OR;  Service: Orthopedics;  Laterality: Right;   ORIF PELVIC FRACTURE WITH PERCUTANEOUS SCREWS Bilateral 04/24/2023   Procedure: ORIF PELVIC FRACTURE WITH PERCUTANEOUS SCREWS;  Surgeon:  Roby Lofts, MD;  Location: [facility] OR;  Service: Orthopedics;  Laterality: Bilateral;    Assessment & Plan Clinical Impression: Patient is a  82 year old R handed  male who presented to the emergency department on 06/26/2022 from home for altered mental status. medical history is significant for recent hospitalization for hemorrhagic shock from pelvic fractures and and olecranon fracture discharged to skilled nursing facility.  He was discharged from there to home with family.  At home he was taking lorazepam and, gabapentin, Robaxin and oxycodone.  Was found to be COVID positive and admitted and placed on dexamethasone and remdesivir.  Mental status improved.  Lidoderm patch applied for low back pain. He was to follow-up with Dr. Jena Gauss for his pelvic ring fracture status post percutaneous fixation- is now WBAT BLE. Patient transferred to CIR on 07/04/2023 .    Patient currently requires {WGN:5621308} with {MVH:8469629} secondary to {BMWUXLKGMWN:0272536}.  Prior to hospitalization, patient could complete *** with {UYQ:0347425}.  Patient will benefit from skilled intervention to {benefit of skilled intervention:3041641} prior to discharge {discharge:3041642}.  Anticipate patient will require {supervision/assistance:22779} and {follow ZD:6387564}.      OT Evaluation Precautions/Restrictions    General   Vital Signs Therapy Vitals Temp: 98.1 F (36.7 C) Temp Source: Oral Pulse Rate: 65 Resp: 18 BP: 125/78 Oxygen Therapy SpO2: 97 % O2 Device: Room Air Pain Pain Assessment Pain Scale: 0-10 Pain Score: 0-No pain Home Living/Prior Functioning Home Living Family/patient expects to be discharged to:: Private residence Living Arrangements: Spouse/significant other Vision   Perception    Praxis   Cognition   Sensation   Motor     Trunk/Postural Assessment     Balance   Extremity/Trunk Assessment      Care Tool Care Tool Self Care Eating        Oral Care          Bathing              Upper Body Dressing(including orthotics)            Lower Body Dressing (excluding footwear)          Putting on/Taking off footwear             Care Tool Toileting Toileting activity         Care Tool Bed Mobility Roll left and right activity        Sit to lying activity        Lying to sitting on side of bed activity         Care Tool Transfers Sit to stand transfer        Chair/bed transfer         Toilet transfer         Care Tool Cognition  Expression of Ideas and Wants    Understanding Verbal and Non-Verbal Content     Memory/Recall Ability     Refer to Care Plan for Long Term Goals  SHORT TERM GOAL WEEK 1    Recommendations for other services: {RECOMMENDATIONS FOR OTHER SERVICES:3049016}   Skilled Therapeutic Intervention ADL   Mobility      Discharge Criteria: Patient will be discharged from OT if patient refuses treatment 3 consecutive times  without medical reason, if treatment goals not met, if there is a change in medical status, if patient makes no progress towards goals or if patient is discharged from hospital.  The above assessment, treatment plan, treatment alternatives and goals were discussed and mutually agreed upon: {Assessment/Treatment Plan Discussed/Agreed:3049017}  Clare Fennimore, Charisse March 07/04/2023, 10:59 PM

## 2023-07-04 NOTE — Discharge Summary (Addendum)
Name: Louis Juarez. MRN: [id number] DOB: 08-13-1940 82 y.o. PCP: Louis Presto, MD  Date of Admission: 06/27/2023 10:14 AM Date of Discharge: 07/04/23 Attending Physician: Dr.  Sol Juarez  Discharge Diagnosis: Principal Problem:   Acute metabolic encephalopathy Active Problems:   NICM (nonischemic cardiomyopathy) (HCC)   Acute hypoxic respiratory failure (HCC)   COVID-19    Discharge Medications: Allergies as of 07/04/2023       Reactions   Morphine Other (See Comments)   Agitation/hallucinations        Medication List     PAUSE taking these medications    furosemide 20 MG tablet Wait to take this until your doctor or other care provider tells you to start again. Commonly known as: LASIX Take 1 tablet (20 mg total) by mouth 2 (two) times daily.   gabapentin 300 MG capsule Wait to take this until your doctor or other care provider tells you to start again. Commonly known as: NEURONTIN Take 1 capsule (300 mg total) by mouth 3 (three) times daily.   methocarbamol 750 MG tablet Wait to take this until your doctor or other care provider tells you to start again. Commonly known as: ROBAXIN Take 1 tablet (750 mg total) by mouth every 8 (eight) hours.   Oxycodone HCl 10 MG Tabs Wait to take this until your doctor or other care provider tells you to start again. Take 1 tablet (10 mg total) by mouth every 4 (four) hours as needed (pain level 7 or higher).   potassium chloride SA 20 MEQ tablet Wait to take this until your doctor or other care provider tells you to start again. Commonly known as: KLOR-CON M Take 1 tablet (20 mEq total) by mouth 2 (two) times daily.   traZODone 50 MG tablet Wait to take this until your doctor or other care provider tells you to start again. Commonly known as: DESYREL Take 50 mg by mouth at bedtime.       STOP taking these medications    doxycycline 100 MG tablet Commonly known as: VIBRA-TABS   Eliquis 2.5 MG Tabs  tablet Generic drug: apixaban   omeprazole 40 MG capsule Commonly known as: PRILOSEC   QUEtiapine 100 MG tablet Commonly known as: SEROQUEL   sulfamethoxazole-trimethoprim 800-160 MG tablet Commonly known as: BACTRIM DS       TAKE these medications    acetaminophen 325 MG tablet Commonly known as: TYLENOL Take 2 tablets (650 mg total) by mouth every 6 (six) hours.   albuterol 108 (90 Base) MCG/ACT inhaler Commonly known as: VENTOLIN HFA Inhale 2 puffs into the lungs every 6 (six) hours as needed for wheezing or shortness of breath.   ascorbic acid 500 MG tablet Commonly known as: VITAMIN C Take 1 tablet (500 mg total) by mouth daily.   carvedilol 25 MG tablet Commonly known as: COREG Take 1 tablet (25 mg total) by mouth 2 (two) times daily with Juarez meal.   ezetimibe 10 MG tablet Commonly known as: ZETIA Take 1 tablet (10 mg total) by mouth at bedtime.   levocetirizine 5 MG tablet Commonly known as: XYZAL SMARTSIG:1 Tablet(s) By Mouth Every Evening   lidocaine 5 % Commonly known as: LIDODERM Place 1 patch onto the skin daily. Remove & Discard patch within 12 hours or as directed by MD   LORazepam 1 MG tablet Commonly known as: ATIVAN Take 1 tablet (1 mg total) by mouth at bedtime.   multivitamin capsule Take 1 capsule by mouth daily.  pantoprazole 40 MG tablet Commonly known as: PROTONIX Take 1 tablet (40 mg total) by mouth daily.   polyethylene glycol powder 17 GM/SCOOP powder Commonly known as: GLYCOLAX/MIRALAX Mix as directed and take 1 capful ( 17 g) by mouth 2 (two) times daily.   pravastatin 80 MG tablet Commonly known as: PRAVACHOL Take 80 mg by mouth at bedtime.   sertraline 25 MG tablet Commonly known as: ZOLOFT Take 1 tablet (25 mg total) by mouth daily.   spironolactone 25 MG tablet Commonly known as: ALDACTONE Take 1 tablet (25 mg total) by mouth daily.   tamsulosin 0.4 MG Caps capsule Commonly known as: FLOMAX Take 1 capsule (0.4 mg  total) by mouth daily after supper.   vitamin D3 25 MCG tablet Commonly known as: CHOLECALCIFEROL Take 2 tablets (2,000 Units total) by mouth daily.   Zinc Sulfate 220 (50 Zn) MG Tabs Take 1 tablet (220 mg total) by mouth daily after supper.        Disposition and follow-up:   Louis Juarez. was discharged from [facility] in Good condition.  At the hospital follow up visit please address:  1.  Follow-up:  Juarez. Encephalopathy: ensure symptoms resolved, evaluate at f/u regarding centrally acting meds that were stopped and if needed to be restarted   2.  Labs / imaging needed at time of follow-up: none  3.  Pending labs/ test needing follow-up: none  4.  Medication Changes  -STOP: lasix (remained euvolemic, can reassess at PCP f/u to restart)  -STOP: gabapentin, robaxin, oxycodone, trazodone  -STOP: Eliquis (completed course postop DVT prophylaxis), Bactrim (completed course)  Follow-up Appointments:  Follow-up Information     Corrington, Louis A, MD. Schedule an appointment as soon as possible for Juarez visit.   Specialty: Family Medicine Contact information: [phone] [place] [address] [phone]                 Hospital Course by problem list: Louis Juarez. is Juarez 82 y.o. male with past history of dilated cardiomyopathy, AICD, hypertension, hyperlipidemia, diabetes presents with acute mental status changes, and admitted for acute metabolic encephalopathy.   # Acute metabolic encephalopathy, resolved On presentation, he was noted to be somnolent, but arouses to voice.  Was found to be COVID-positive. The cause of his altered mental status was thought to be from COVID infection combined with many central acting medicines.  We held all his central acting medicines, which improved his mentation, and also treated him for COVID infection.  Patient was back to his baseline after holding central acting meds for 12 to 24 hours. Patient  and patient's family agreeable to inpatient rehab. His home sertraline was restarted on admission and home Ativan during hospital course at bedtime. Still holding home gabapentin, oxycodone, trazodone and Robaxin since patient has not been complaining of pain. Remains alert and oriented x3 at time of discharge.    # Acute hypoxic respiratory failure, resolved # COVID infection On presentation, he was on 3 L of nasal cannula to maintain Oxygen >92%.  AHRF secondary to COVID infection. Precautions were in place during hospital course. He got 2 doses of remdesivir, and decadron daily until discharge. Noted improvement in respiratory status and stable on room air.   # Pelvic ring fracture s/p percutaneous fixation  Follows with orthopedics, Dr. Jena Gauss.  He has residual weakness on the right leg not able to move as much, otherwise tolerated outpatient physical therapy, improved strength  on the left extremity.  Repeat imaging of the hip and spine shows no new fractures. Was on Eliquis 2.5 mg BID for DVT prophylaxis postop x 30 days. No history of Afib or flutter requiring anticoagulation. Not on anticoagulation prior. Completed Eliquis course during this hospital course.    # Chronic systolic CHF/nonischemic cardiomyopathy # HTN We held his blood pressure meds initially on presentation.  Resumed home spironolactone and Coreg during course.  Not volume overloaded during hospital stay, holding home Lasix which can be resumed at f/u after discharge.    Stable chronic medical conditions.   # HLD: Continued pravastatin 80 mg and Ezetimibe 10 mg. # MDD/Sleep: Continued home sertraline 25 mg daily and Ativan 1 mg at bedtime PRN.  # BPH: Continued home tamsulosin 0.4 mg at night.     Discharge Subjective: Patient evaluated at bedside this morning.  Denies any new acute concerns.  States he is feeling well and no acute pains at this time.  Discussed plan with patient and answered all questions.  Discharge  Exam:   BP (!) 159/80 (BP Location: Right Arm)   Pulse 61   Temp 98 F (36.7 C) (Oral)   Resp 20   Ht 5\' 10"  (1.778 m)   Wt 73.4 kg   SpO2 94%   BMI 23.22 kg/m  Constitutional: well-appearing male laying in bed comfortably, in no acute distress HENT: normocephalic atraumatic Cardiovascular: Regular rate and rhythm Pulmonary/Chest: Normal work of breathing on room air, lungs clear to auscultation bilaterally MSK: No LE edema Neurological: Alert and oriented X3 Psych: Pleasant mood  Pertinent Labs, Studies, and Procedures:     Latest Ref Rng & Units 06/30/2023    4:16 AM 06/29/2023    4:03 AM 06/28/2023    8:43 AM  CBC  WBC 4.0 - 10.5 K/uL 9.0  10.7  5.8   Hemoglobin 13.0 - 17.0 g/dL 24.4  01.0  27.2   Hematocrit 39.0 - 52.0 % 36.2  38.4  39.4   Platelets 150 - 400 K/uL 246  263  261        Latest Ref Rng & Units 06/30/2023    4:16 AM 06/29/2023    4:03 AM 06/28/2023    8:43 AM  CMP  Glucose 70 - 99 mg/dL 536  644  034   BUN 8 - 23 mg/dL 17  19  15    Creatinine 0.61 - 1.24 mg/dL 7.42  5.95  6.38   Sodium 135 - 145 mmol/L 137  138  138   Potassium 3.5 - 5.1 mmol/L 3.7  3.6  4.3   Chloride 98 - 111 mmol/L 102  103  102   CO2 22 - 32 mmol/L 26  25  25    Calcium 8.9 - 10.3 mg/dL 9.0  9.6  9.5     CT Head Wo Contrast Result Date: 06/27/2023 CLINICAL DATA:  Altered mental status, head and neck pain due to trauma. EXAM: CT HEAD WITHOUT CONTRAST CT CERVICAL SPINE WITHOUT CONTRAST TECHNIQUE: Multidetector CT imaging of the head and cervical spine was performed following the standard protocol without intravenous contrast. Multiplanar CT image reconstructions of the cervical spine were also generated. RADIATION DOSE REDUCTION: This exam was performed according to the departmental dose-optimization program which includes automated exposure control, adjustment of the mA and/or kV according to patient size and/or use of iterative reconstruction technique. COMPARISON:  CT head and  cervical spine dated 04/24/2023. FINDINGS: Motion artifact limits the sensitivity of the exam. CT HEAD  FINDINGS Brain: No evidence of acute infarction, hemorrhage, hydrocephalus, extra-axial collection or mass lesion/mass effect. There is mild cerebral volume loss with associated ex vacuo dilatation. Periventricular white matter hypoattenuation likely represents chronic small vessel ischemic disease. 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 Vascular: There are vascular calcifications in the carotid siphons. Skull: Normal. Negative for fracture or focal lesion. Sinuses/Orbits: Bilateral ethmoid sinus disease. Other: None. CT CERVICAL SPINE FINDINGS Alignment: Normal. Skull base and vertebrae: No acute fracture. No primary bone lesion or focal pathologic process. Soft tissues and spinal canal: No prevertebral fluid or swelling. No visible canal hematoma. Disc levels: Up to severe multilevel degenerative disc and joint disease which results in moderate neuroforaminal and mild central canal stenosis. Upper chest: Negative. Other: Atherosclerotic calcifications are seen in the carotid arteries. IMPRESSION: 1. No acute intracranial abnormality. 2. No acute fracture or traumatic subluxation of the cervical spine. 3. Bilateral ethmoid sinus disease. Electronically Signed   By: Romona Curls M.D.   On: 06/27/2023 11:57   CT Cervical Spine Wo Contrast Result Date: 06/27/2023 CLINICAL DATA:  Altered mental status, head and neck pain due to trauma. EXAM: CT HEAD WITHOUT CONTRAST CT CERVICAL SPINE WITHOUT CONTRAST TECHNIQUE: Multidetector CT imaging of the head and cervical spine was performed following the standard protocol without intravenous contrast. Multiplanar CT image reconstructions of the cervical spine were also generated. RADIATION DOSE REDUCTION: This exam was performed according to the departmental dose-optimization program which includes automated exposure control, adjustment of the  mA and/or kV according to patient size and/or use of iterative reconstruction technique. COMPARISON:  CT head and cervical spine dated 04/24/2023. FINDINGS: Motion artifact limits the sensitivity of the exam. CT HEAD FINDINGS Brain: No evidence of acute infarction, hemorrhage, hydrocephalus, extra-axial collection or mass lesion/mass effect. There is mild cerebral volume loss with associated ex vacuo dilatation. Periventricular white matter hypoattenuation likely represents chronic small vessel ischemic disease. 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 Vascular: There are vascular calcifications in the carotid siphons. Skull: Normal. Negative for fracture or focal lesion. Sinuses/Orbits: Bilateral ethmoid sinus disease. Other: None. CT CERVICAL SPINE FINDINGS Alignment: Normal. Skull base and vertebrae: No acute fracture. No primary bone lesion or focal pathologic process. Soft tissues and spinal canal: No prevertebral fluid or swelling. No visible canal hematoma. Disc levels: Up to severe multilevel degenerative disc and joint disease which results in moderate neuroforaminal and mild central canal stenosis. Upper chest: Negative. Other: Atherosclerotic calcifications are seen in the carotid arteries. IMPRESSION: 1. No acute intracranial abnormality. 2. No acute fracture or traumatic subluxation of the cervical spine. 3. Bilateral ethmoid sinus disease. Electronically Signed   By: Romona Curls M.D.   On: 06/27/2023 11:57   DG Pelvis Portable Result Date: 06/27/2023 CLINICAL DATA:  Multiple recent falls. EXAM: PORTABLE PELVIS 1-2 VIEWS COMPARISON:  05/25/2023 FINDINGS: Stable pelvic fixation hardware and symphysis pubis diastasis. No acute fracture or dislocation. Lower lumbar spine degenerative changes. IMPRESSION: No acute abnormality. Electronically Signed   By: Beckie Salts M.D.   On: 06/27/2023 11:30   DG Chest Portable 1 View Result Date: 06/27/2023 CLINICAL DATA:  Altered  mental status.  Multiple recent falls. EXAM: PORTABLE CHEST 1 VIEW COMPARISON:  Earlier today. FINDINGS: Decreased inspiration. Normal-sized heart, magnified by the decreased inspiration and portable AP technique. Crowding of the pulmonary vasculature and lung markings by the poor inspiration with no significant change in mild chronic interstitial prominence. Stable left subclavian pacer and AICD leads. Interval  patient rotation to the right. Thoracic spine degenerative changes. IMPRESSION: 1. Decreased inspiration with no significant change in mild chronic interstitial prominence. 2. No acute abnormality. Electronically Signed   By: Beckie Salts M.D.   On: 06/27/2023 11:28     Discharge Instructions: Discharge Instructions     Call MD for:  difficulty breathing, headache or visual disturbances   Complete by: As directed    Call MD for:  persistant dizziness or light-headedness   Complete by: As directed    Call MD for:  persistant nausea and vomiting   Complete by: As directed    Call MD for:  severe uncontrolled pain   Complete by: As directed    Call MD for:  temperature >100.4   Complete by: As directed    Discharge instructions   Complete by: As directed    You were hospitalized for confusion likely in setting of COVID infection and certain medications.  I am happy to see that you have improved and symptoms have resolved.  You completed steroid course for COVID infection.  You will continue therapy at inpatient rehab to improve your strength.  Thank you for allowing [place] to be part of your care.   Please make sure to arrange follow-up with your primary care provider.  Please note these changes made to your medications:  *Please STOP taking:  -Stop Eliquis (completed course) -Stop Bactrim (completed course) -Stop oxycodone, Robaxin, gabapentin, trazodone (follow-up with your provider regarding restarting these medications)   Increase activity slowly   Complete by: As directed         Signed: Rana Snare, DO 07/04/2023, 10:12 AM   Pager: [phone]

## 2023-07-04 NOTE — Progress Notes (Signed)
Inpatient Rehab Admissions Coordinator:  There is a bed available for pt in CIR today. Dr. Sherrilee Gilles aware and in agreement. Pt, pt's wife, NSG and TOC made aware.   Wolfgang Phoenix, MS, CCC-SLP Admissions Coordinator [phone]

## 2023-07-04 NOTE — Plan of Care (Signed)
  Problem: Education: Goal: Knowledge of risk factors and measures for prevention of condition will improve Outcome: Adequate for Discharge   Problem: Education: Goal: Knowledge of General Education information will improve Description: Including pain rating scale, medication(s)/side effects and non-pharmacologic comfort measures Outcome: Adequate for Discharge   Problem: Health Behavior/Discharge Planning: Goal: Ability to manage health-related needs will improve Outcome: Adequate for Discharge   Problem: Clinical Measurements: Goal: Ability to maintain clinical measurements within normal limits will improve Outcome: Adequate for Discharge Goal: Will remain free from infection Outcome: Adequate for Discharge Goal: Diagnostic test results will improve Outcome: Adequate for Discharge Goal: Respiratory complications will improve Outcome: Adequate for Discharge Goal: Cardiovascular complication will be avoided Outcome: Adequate for Discharge   Problem: Activity: Goal: Risk for activity intolerance will decrease Outcome: Adequate for Discharge   Problem: Nutrition: Goal: Adequate nutrition will be maintained Outcome: Adequate for Discharge   Problem: Coping: Goal: Level of anxiety will decrease Outcome: Adequate for Discharge   Problem: Elimination: Goal: Will not experience complications related to bowel motility Outcome: Adequate for Discharge Goal: Will not experience complications related to urinary retention Outcome: Adequate for Discharge   Problem: Safety: Goal: Ability to remain free from injury will improve Outcome: Adequate for Discharge   Problem: Skin Integrity: Goal: Risk for impaired skin integrity will decrease Outcome: Adequate for Discharge

## 2023-07-04 NOTE — H&P (Signed)
Physical Medicine and Rehabilitation Admission H&P     CC: Functional deficits secondary to encephalopathy   HPI: Louis Juarez, Louis Juarez is an 82 year old R handed  male who presented to the emergency department on 06/26/2022 from home for altered mental status.  Family stated he was not responding as normal and had fallen several times. (Of note, pt said felt "fine" but just tired and lightheaded.   A right elbow wound was reported.  His medical history is significant for recent hospitalization for hemorrhagic shock from pelvic fractures and and olecranon fracture discharged to skilled nursing facility.  He was discharged from there to home with family.  At home he was taking lorazepam and, gabapentin, Robaxin and oxycodone.  Was found to be COVID positive and admitted and placed on dexamethasone and remdesivir.  Mental status improved.  Lidoderm patch applied for low back pain.  PT and OT evaluations obtained.  Restarted patient's lorazepam and gabapentin to prevent any withdrawal from central acting drugs.  Sertraline also continued.  He was to follow-up with Dr. Jena Gauss for his pelvic ring fracture status post percutaneous fixation- is now WBAT B/L LE's.  .  Eliquis 2.5 mg BID initiated 12/08 for DVT prophylaxis.  Continue dexamethasone through 12/17.  Tolerating regular diet. The patient requires inpatient medicine and rehabilitation evaluations and services for ongoing dysfunction secondary to debility.     Also after nursing told me he fell out of chair this afternoon- pt denied true fall- said "slipped out of chair"- and "only pain is ego"     Review of Systems  Constitutional:  Positive for malaise/fatigue.  HENT: Negative.    Eyes: Negative.   Respiratory: Negative.         Said no SOB or Resp Sx's after COVID  Cardiovascular: Negative.   Gastrointestinal:  Negative for constipation, diarrhea and nausea.  Genitourinary:  Negative for dysuria, frequency and urgency.  Musculoskeletal:  Negative.   Skin: Negative.   Neurological:  Positive for dizziness.  Endo/Heme/Allergies: Negative.   Psychiatric/Behavioral:  The patient has insomnia.   All other systems reviewed and are negative.       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 Bilateral pleural effusions,  Question mild subpleural reticulation, indicative of fibrosis.  Coronary artery calcification. 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 Wall thickness was increased in a pattern of mild LVH.- Aortic valve: AV is thckened, calcified with no signifi stenosis.The atrium was severely dilated.: Systolic function was moderately reduced.The atrium was mildly dilated.PA peak pre   Nonischemic cardiomyopathy (HCC)     Palpitations      Pneumonia     PVC (premature ventricular contraction) 07/02/2011   PVC's (premature ventricular contractions)     Wheezing 04/29/2011    Sinus ct 05/2011:    Wheezing               Past Surgical History:  Procedure Laterality Date   BI-VENTRICULAR IMPLANTABLE CARDIOVERTER DEFIBRILLATOR N/A 02/13/2012    Procedure: BI-VENTRICULAR IMPLANTABLE CARDIOVERTER DEFIBRILLATOR  (CRT-D);  Surgeon: Hillis Range, MD;  Location: [facility] CATH LAB;  Service: Cardiovascular;  Laterality: N/A;   BI-VENTRICULAR IMPLANTABLE CARDIOVERTER DEFIBRILLATOR  (CRT-D)   02/13/2012   BI-VENTRICULAR IMPLANTABLE CARDIOVERTER DEFIBRILLATOR  (CRT-D)   05/03/2019    CHANGEOUT   BIV ICD GENERATOR CHANGEOUT N/A 05/03/2019    Procedure: BIV ICD GENERATOR CHANGEOUT;  Surgeon: Hillis Range, MD;  Location: [facility] INVASIVE CV LAB;  Service: Cardiovascular;  Laterality: N/A;   CARDIAC CATHETERIZATION   2013   CARDIAC DEFIBRILLATOR PLACEMENT   02/13/2012    [employer] BIV ICD implanted by Dr Johney Frame   IRRIGATION AND DEBRIDEMENT ELBOW Right 05/08/2023    Procedure: IRRIGATION AND DEBRIDEMENT ELBOW;  Surgeon: Roby Lofts, MD;  Location: [facility] OR;  Service: Orthopedics;  Laterality: Right;   IRRIGATION AND DEBRIDEMENT ELBOW Right 04/24/2023    Procedure: IRRIGATION AND DEBRIDEMENT ELBOW;  Surgeon: Roby Lofts, MD;  Location: [facility] OR;  Service: Orthopedics;  Laterality: Right;   NASAL SINUS SURGERY   93, 97, 2010, 2006   NASAL SINUS SURGERY        1993, 1997, 2006, 2010   ORIF PELVIC FRACTURE Right 05/08/2023    Procedure: OPEN REDUCTION INTERNAL FIXATION (ORIF) PELVIC FRACTURE;  Surgeon: Roby Lofts, MD;  Location: [facility] OR;  Service: Orthopedics;  Laterality: Right;   ORIF PELVIC FRACTURE WITH PERCUTANEOUS SCREWS Bilateral 04/24/2023    Procedure: ORIF PELVIC FRACTURE WITH PERCUTANEOUS SCREWS;  Surgeon: Roby Lofts, MD;  Location: [facility] OR;  Service: Orthopedics;  Laterality: Bilateral;             Family History  Problem  Relation Age of Onset   Colon cancer Father     Melanoma Mother     Melanoma Sister          Social History:  reports that he quit smoking about 21 years ago. His smoking use included cigarettes. He started smoking about 41 years ago. He has a 30 pack-year smoking history. He has never used smokeless tobacco. He reports that he does not drink alcohol and does not use drugs. Allergies:  Allergies       Allergies  Allergen Reactions   Morphine Other (See Comments)      Agitation/hallucinations            Medications Prior to Admission  Medication Sig Dispense Refill   acetaminophen (TYLENOL) 325 MG tablet Take 2 tablets (650 mg total) by mouth every 6 (six) hours. 150 tablet 0   albuterol (VENTOLIN HFA) 108 (90 Base) MCG/ACT inhaler Inhale 2 puffs into the lungs every 6 (six) hours as needed  for wheezing or shortness of breath.       apixaban (ELIQUIS) 2.5 MG TABS tablet Take 1 tablet (2.5 mg total) by mouth 2 (two) times daily. 60 tablet 0   ascorbic acid (VITAMIN C) 500 MG tablet Take 1 tablet (500 mg total) by mouth daily. 30 tablet 0   carvedilol (COREG) 25 MG tablet Take 1 tablet (25 mg total) by mouth 2 (two) times daily with a meal. 60 tablet 0   ezetimibe (ZETIA) 10 MG tablet Take 1 tablet (10 mg total) by mouth at bedtime. 30 tablet 0   furosemide (LASIX) 20 MG tablet Take 1 tablet (20 mg total) by mouth 2 (two) times daily. 60 tablet 0   gabapentin (NEURONTIN) 300 MG capsule Take 1 capsule (300 mg total) by mouth 3 (three) times daily. 90 capsule 0   levocetirizine (XYZAL) 5 MG tablet SMARTSIG:1 Tablet(s) By Mouth Every Evening       LORazepam (ATIVAN) 1 MG tablet Take 1 tablet (1 mg total) by mouth at bedtime.       methocarbamol (ROBAXIN) 750 MG tablet Take 1 tablet (750 mg total) by mouth every 8 (eight) hours. 90 tablet 0   Multiple Vitamin (MULTIVITAMIN) capsule Take 1 capsule by mouth daily.        omeprazole (PRILOSEC) 40 MG capsule Take 40 mg by mouth every morning.        Oxycodone HCl 10 MG TABS Take 1 tablet (10 mg total) by mouth every 4 (four) hours as needed (pain level 7 or higher). 35 tablet 0   pantoprazole (PROTONIX) 40 MG tablet Take 1 tablet (40 mg total) by mouth daily. 30 tablet 0   potassium chloride SA (KLOR-CON M) 20 MEQ tablet Take 1 tablet (20 mEq total) by mouth 2 (two) times daily. 60 tablet 0   pravastatin (PRAVACHOL) 80 MG tablet Take 80 mg by mouth at bedtime.        sertraline (ZOLOFT) 25 MG tablet Take 1 tablet (25 mg total) by mouth daily. 30 tablet 0   spironolactone (ALDACTONE) 25 MG tablet Take 1 tablet (25 mg total) by mouth daily. 30 tablet 0   [EXPIRED] sulfamethoxazole-trimethoprim (BACTRIM DS) 800-160 MG tablet Take 1 tablet by mouth 2 (two) times daily.       tamsulosin (FLOMAX) 0.4 MG CAPS capsule Take 1 capsule (0.4 mg total) by mouth daily after supper. 30 capsule 0   traZODone (DESYREL) 50 MG tablet Take 50 mg by mouth at bedtime.       vitamin D3 (CHOLECALCIFEROL) 25 MCG tablet Take 2 tablets (2,000 Units total) by mouth daily. 30 tablet 0   Zinc Sulfate 220 (50 Zn) MG TABS Take 1 tablet (220 mg total) by mouth daily after supper. 30 tablet 0   doxycycline (VIBRA-TABS) 100 MG tablet Take 1 tablet (100 mg total) by mouth 2 (two) times daily. Continue thru 11/15 (Patient not taking: Reported on 06/27/2023)       polyethylene glycol powder (GLYCOLAX/MIRALAX) 17 GM/SCOOP powder Mix as directed and take 1 capful ( 17 g) by mouth 2 (two) times daily. (Patient not taking: Reported on 06/27/2023) 238 g 0   QUEtiapine (SEROQUEL) 100 MG tablet Take 1 tablet (100 mg total) by mouth at bedtime. (Patient not taking: Reported on 06/27/2023) 30 tablet 0              Home: Home Living Family/patient expects to be discharged to:: Private residence Living Arrangements: Spouse/significant other Available Help at Discharge: Family, Available  24 hours/day Type of Home: House Home Access: Stairs to enter [employer] of Steps:  3 Entrance Stairs-Rails: Can reach both Home Layout: Able to live on main level with bedroom/bathroom Bathroom Shower/Tub: Health visitor: Standard Bathroom Accessibility: Yes Home Equipment: Wheelchair - manual, Other (comment) (sliding board; unclear if pt has a RW) Additional Comments: Pt is an unreliable reporter. Pt report this day does not entirely match chart review. Home Living information taken from chart review as pt stated this day he has12 steps to enter at the front door and 19 at the back door. Pt further reports he is using a w/c at home and reports he is climbing stairs with no issues.   Functional History: Prior Function Prior Level of Function : Independent/Modified Independent, Driving Mobility Comments: At baseline, pt is Independent without an AD. Per chart review, following fall and hospital admission in 04/2023, on 05/31/23, pt was completing sliding board transfers with Min assist. Pt unable to provide reliable report of funcitonal level just prior to this admission. ADLs Comments: At baseline, pt is Independent with ADLs, IADLs, and drives. Per chart review, following fall and hospital admission in 04/2023, on 05/31/23, pt was completing UB ADLs with Mod I and LB ADLs with Mod to Max assist. Pt unable to provide reliable report of funcitonal level just prior to this admission, but states wife and son currently assist with ADLs and IADLs.   Functional Status:  Mobility: Bed Mobility Overal bed mobility: Needs Assistance Bed Mobility: Rolling, Sidelying to Sit Rolling: Supervision Sidelying to sit: Supervision Supine to sit: Contact guard Sit to supine: Contact guard assist Sit to sidelying: Contact guard assist General bed mobility comments: use of bed features/rails to perform; Pt with legs hanging over side of bed and bed alarm sounding as PTA arriving to his room, pt's family present. Transfers Overall transfer level: Needs assistance Equipment  used: Rolling walker (2 wheels), None Transfers: Sit to/from Stand, Bed to chair/wheelchair/BSC Sit to Stand: Min assist, Mod assist Bed to/from chair/wheelchair/BSC transfer type:: Step pivot Squat pivot transfers: Min assist Step pivot transfers: Mod assist General transfer comment: cues for hand placement/technique; Pt requiring up to Mod assist to maintain balance during step-pivot transfers due to LLE weakness with single leg stance when lifting RLE to take steps; minA to rise from EOB/chair and modA for stand>sit due to poor eccentric control to sit. Ambulation/Gait General Gait Details: no ambulation per MD (ortho) pt WBAT Bil LE for transfers only. Pre-gait activities: standing BLE AROM: hip flexion with RW support x5 reps ea, limited due to LLE weakness/instability when lifting RLE   ADL: ADL Overall ADL's : Needs assistance/impaired Eating/Feeding: Set up, Sitting Grooming: Supervision/safety, Cueing for sequencing, Sitting Upper Body Bathing: Minimal assistance, Cueing for sequencing, Cueing for compensatory techniques, Sitting Lower Body Bathing: Maximal assistance, +2 for safety/equipment, Sit to/from stand, Sitting/lateral leans, Cueing for compensatory techniques, Cueing for sequencing Upper Body Dressing : Contact guard assist, Cueing for sequencing, Sitting Lower Body Dressing: Moderate assistance, Cueing for safety, Cueing for sequencing, Sit to/from stand, Sitting/lateral leans Toilet Transfer: Minimal assistance, Moderate assistance, Cueing for safety, Cueing for sequencing, BSC/3in1, Rolling walker (2 wheels) (step-pivot transfers; cues for hand placement/technique) Toilet Transfer Details (indicate cue type and reason): Pt requiring Min to Mod assist to maintain balance during step-pivot transfers. Toileting- Clothing Manipulation and Hygiene: Maximal assistance, Cueing for safety, Cueing for sequencing, Cueing for compensatory techniques, Sit to/from stand General ADL  Comments: Pt with continued decreased activity tolerance,  fatiguing quickly during funcitonal tasks. Pt with noted improvements in funcitonal level with transfers and LB dressing due to pt now being WBAT to B LE.   Cognition: Cognition Overall Cognitive Status: Impaired/Different from baseline Orientation Level: Oriented to person, Oriented to place Cognition Arousal: Alert Behavior During Therapy: WFL for tasks assessed/performed Overall Cognitive Status: Impaired/Different from baseline Area of Impairment: Memory, Safety/judgement, Awareness, Attention, Following commands, Problem solving Orientation Level: Disoriented to, Time, Situation (Pt oriented to self, wife, and being in the hospital. Pt not oriented to reason for hospitalization.) Current Attention Level: Sustained Memory: Decreased short-term memory, Decreased recall of precautions Following Commands: Follows one step commands consistently, Follows multi-step commands consistently, Follows multi-step commands with increased time Safety/Judgement: Decreased awareness of safety, Decreased awareness of deficits Awareness: Emergent Problem Solving: Slow processing, Decreased initiation, Difficulty sequencing, Requires verbal cues General Comments: Pt with decreased insight into deficits, occasionally stating "I can walk". Pt reoriented to ortho MD orders and WBAT on BLE for transfers only at this time, pt not yet cleared for longer distance ambulation until MD clears him for this.   Physical Exam: Blood pressure 119/71, pulse 77, temperature 97.8 F (36.6 C), temperature source Oral, resp. rate 16, height 5\' 10"  (1.778 m), weight 73.4 kg, SpO2 100%. Physical Exam Vitals and nursing note reviewed. Exam conducted with a chaperone present.  Constitutional:      Appearance: Normal appearance.     Comments: Pt supine in bed; awake, alert, off airborne precautions and sats 99% on RA; appears mildly encephalopathic, NAD-   HENT:      Head: Normocephalic and atraumatic.     Nose: Nose normal.     Mouth/Throat:     Mouth: Mucous membranes are dry.     Pharynx: Oropharynx is clear.  Eyes:     General:        Right eye: No discharge.        Left eye: No discharge.     Extraocular Movements: Extraocular movements intact.  Cardiovascular:     Rate and Rhythm: Normal rate and regular rhythm.     Pulses: Normal pulses.     Heart sounds: Normal heart sounds.  Pulmonary:     Effort: Pulmonary effort is normal. No respiratory distress.     Breath sounds: Normal breath sounds. No wheezing, rhonchi or rales.  Abdominal:     General: Bowel sounds are normal. There is no distension.     Palpations: Abdomen is soft.     Tenderness: There is no abdominal tenderness.  Musculoskeletal:        General: Normal range of motion.     Cervical back: Neck supple. No rigidity.     Comments: Ue's 5/5 in B/L UE"s  RLE- 4/5 in HF except for R DF 2/5 LLE- 5-/5 throughout-  Pt wasn't clear when got weak in R DF, but admits it's chronic  Skin:    General: Skin is warm and dry.     Comments: A few bruises on Ue's   Neurological:     Mental Status: He is alert.     Comments: Ox2-3 however denies any cognitive issues, even though admitted for AMS; also perseverative on topic of exact time coming to rehab; what "the plan was"- and kept saying Hotel instead of hospital  Sounds a little confused on exam-  Intact to light touch in all 4 extremities Perseverates severely.   Psychiatric:     Comments: Frustrated about CIR- and not having exact details  Lab Results Last 48 Hours  No results found for this or any previous visit (from the past 48 hours).   Imaging Results (Last 48 hours)  No results found.         Blood pressure 119/71, pulse 77, temperature 97.8 F (36.6 C), temperature source Oral, resp. rate 16, height 5\' 10"  (1.778 m), weight 73.4 kg, SpO2 100%.   Medical Problem List and Plan: 1. Functional deficits  secondary to acute encephalopathy             -patient may  shower             -ELOS/Goals: 7-10 days- pt wants more more than 5 days - min A to Supervision             Admit to CIR- con't CIR PT, OT and SLP   2.  Antithrombotics: -DVT/anticoagulation:  Pharmaceutical: Eliquis 2.5 mg BID             -antiplatelet therapy: none   3. Pain Management: Tylenol as needed   4. Mood/Behavior/Sleep: LCSW to evaluate and provide emotional support             -continue sertraline 25 mg daily             -antipsychotic agents: n/a   5. Neuropsych/cognition: This patient is not quite capable of making decisions on his  own behalf.   6. Skin/Wound Care: Routine skin care checks   7. Fluids/Electrolytes/Nutrition: Routine Is and Os and follow-up chemistries   8: Hypertension: monitor TID and prn             -continue carvedilol 25 mg twice daily             -continue spironolactone 25 mg daily   9: Hyperlipidemia: continue statin and Zetia   10: GERD: continue PPI   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 ??: continue Flomax 0.4 mg with supper   12: HFrEF/chronic systolic CHF/nonischemic cardiomyopathy             -continue carvedilol 25 mg twice daily             -continue spironolactone 25 mg daily             -lasix held   13: Pelvic ring fracture status post percutaneous fixation prior to presentation             -Follow-up with Dr. Jena Gauss             -is now WBAT on B/L LE's- is walking 14: COVID-positive pneumonia: Continue dexamethasone 6 mg daily through 12/17 - is off airborne precautions.  15. Limited DNR 16. Insomnia- was on Seroquel last rehab stay- will restart and melatonin.      Milinda Antis, PA-C 07/03/2023   I have personally performed a face to face diagnostic evaluation of this patient and formulated the key components of the plan.  Additionally, I have personally reviewed laboratory data, imaging studies, as well as relevant notes and concur with the physician assistant's  documentation above.   The patient's status has not changed from the original H&P.  Any changes in documentation from the acute care chart have been noted above.

## 2023-07-05 DIAGNOSIS — I10 Essential (primary) hypertension: Secondary | ICD-10-CM

## 2023-07-05 DIAGNOSIS — G4700 Insomnia, unspecified: Secondary | ICD-10-CM

## 2023-07-05 MED ORDER — TRAZODONE HCL 50 MG PO TABS
50.0000 mg | ORAL_TABLET | Freq: Every day | ORAL | Status: DC
  Administered 2023-07-05 – 2023-07-12 (×8): 50 mg via ORAL
  Filled 2023-07-05 (×8): qty 1

## 2023-07-05 MED ORDER — MELATONIN 5 MG PO TABS
10.0000 mg | ORAL_TABLET | Freq: Every day | ORAL | Status: DC
  Administered 2023-07-05 – 2023-07-12 (×8): 10 mg via ORAL
  Filled 2023-07-05 (×8): qty 2

## 2023-07-05 NOTE — Progress Notes (Signed)
PROGRESS NOTE   Subjective/Complaints:  Pt doing well this morning, working with PT. Slept "horrible" last night-- wife states that after he left the hospital last time, his seroquel was changed to trazodone and ativan, and those together seemed to help for sleep. Unsure of trazodone dose, but ativan was 1mg  dose, he used this every night pretty much. Last hospital stay he was also using melatonin 10mg , which worked some.  Pain well controlled. LBM yesterday. Urinating fine. Denies any other complaints or concerns today.   ROS: as per HPI. Denies CP, SOB, abd pain, N/V/D/C, or any other complaints at this time.    Objective:   No results found. No results for input(s): "WBC", "HGB", "HCT", "PLT" in the last 72 hours. No results for input(s): "NA", "K", "CL", "CO2", "GLUCOSE", "BUN", "CREATININE", "CALCIUM" in the last 72 hours.      Intake/Output Summary (Last 24 hours) at 07/05/2023 0819 Last data filed at 07/04/2023 1849 Gross per 24 hour  Intake 480 ml  Output 50 ml  Net 430 ml        Physical Exam: Vital Signs Blood pressure (!) 152/87, pulse (!) 58, temperature 98 F (36.7 C), resp. rate 18, height 5\' 10"  (1.778 m), weight 75.4 kg, SpO2 98%.  Constitutional:      Appearance: Normal appearance.     Comments: Pt up in w/c; awake, alert, NAD-   HENT: HOH    Head: Normocephalic and atraumatic.     Nose: Nose normal.     Mouth/Throat:     Mouth: Mucous membranes are dry.     Pharynx: Oropharynx is clear.  Eyes:     General:        Right eye: No discharge.        Left eye: No discharge.     Extraocular Movements: Extraocular movements intact.  Cardiovascular:     Rate and Rhythm: Normal rate and regular rhythm.     Pulses: Normal pulses.     Heart sounds: Normal heart sounds.  Pulmonary:     Effort: Pulmonary effort is normal. No respiratory distress.     Breath sounds: Normal breath sounds. No wheezing,  rhonchi or rales.  Abdominal:     General: Bowel sounds are normal. There is no distension.     Palpations: Abdomen is soft.     Tenderness: There is no abdominal tenderness. Skin:    General: Skin is warm and dry.     Comments: A few bruises on Ue's Psych: pleasant and cooperative today  PRIOR EXAMS: Musculoskeletal:        General: Normal range of motion.     Cervical back: Neck supple. No rigidity.     Comments: Ue's 5/5 in B/L UE"s  RLE- 4/5 in HF except for R DF 2/5 LLE- 5-/5 throughout-  Pt wasn't clear when got weak in R DF, but admits it's chronic     Neurological:     Mental Status: He is alert.     Comments: Ox2-3 however denies any cognitive issues, even though admitted for AMS; also perseverative on topic of exact time coming to rehab; what "the plan was"- and kept saying Hotel instead  of hospital  Sounds a little confused on exam-  Intact to light touch in all 4 extremities Perseverates severely.   Psychiatric:     Comments: Frustrated about CIR- and not having exact details   Assessment/Plan: 1. Functional deficits which require 3+ hours per day of interdisciplinary therapy in a comprehensive inpatient rehab setting. Physiatrist is providing close team supervision and 24 hour management of active medical problems listed below. Physiatrist and rehab team continue to assess barriers to discharge/monitor patient progress toward functional and medical goals  Care Tool:  Bathing              Bathing assist       Upper Body Dressing/Undressing Upper body dressing        Upper body assist      Lower Body Dressing/Undressing Lower body dressing            Lower body assist       Toileting Toileting    Toileting assist       Transfers Chair/bed transfer  Transfers assist           Locomotion Ambulation   Ambulation assist              Walk 10 feet activity   Assist           Walk 50 feet activity   Assist            Walk 150 feet activity   Assist           Walk 10 feet on uneven surface  activity   Assist           Wheelchair     Assist               Wheelchair 50 feet with 2 turns activity    Assist            Wheelchair 150 feet activity     Assist          Blood pressure (!) 152/87, pulse (!) 58, temperature 98 F (36.7 C), resp. rate 18, height 5\' 10"  (1.778 m), weight 75.4 kg, SpO2 98%.  Medical Problem List and Plan: 1. Functional deficits secondary to acute encephalopathy             -patient may  shower -ELOS/Goals: 7-10 days- pt wants more more than 5 days - min A to Supervision             Admit to CIR- con't CIR PT, OT and SLP   2.  Antithrombotics: -DVT/anticoagulation:  Pharmaceutical: Eliquis 2.5 mg BID             -antiplatelet therapy: none   3. Pain Management: Tylenol as needed, lidoderm patch for LBP   4. Mood/Behavior/Sleep: LCSW to evaluate and provide emotional support             -continue sertraline 25 mg daily  -antipsychotic agents: n/a -Insomnia- was on Seroquel last rehab stay- will restart and melatonin. -07/05/23 issues with insomnia, wife states he was taken off seroquel after last hospital stay, Dr. Jena Gauss started Trazodone-- looks like maybe 50mg  dosing-- and ativan 1mg  at night.   -will d/c seroquel since not effective  -increase melatonin to 10mg  since that helped last time  -add trazodone 10mg  at bedtime  -has ativan 1mg  PRN dose, advised he would have to ask for it   5. Neuropsych/cognition: This patient is not quite capable of making decisions on his  own  behalf.   6. Skin/Wound Care: Routine skin care checks   7. Fluids/Electrolytes/Nutrition: Routine Is and Os and follow-up chemistries   8: Hypertension: monitor TID and prn             -continue carvedilol 25 mg twice daily             -continue spironolactone 25 mg daily  -07/05/23 BPs variable, but monitor trend   Vitals:   07/04/23  1506 07/04/23 1955 07/05/23 0607  BP: 127/72 125/78 (!) 152/87    9: Hyperlipidemia: continue pravastatin 80mg  daily and Zetia 10mg  daily   10: GERD: continue protonix 20mg  daily   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 ??: continue Flomax 0.4 mg with supper   12: HFrEF/chronic systolic CHF/nonischemic cardiomyopathy             -continue carvedilol 25 mg twice daily             -continue spironolactone 25 mg daily             -lasix held for now-- not overloaded appearing, monitor for need   13: Pelvic ring fracture status post percutaneous fixation prior to presentation             -Follow-up with Dr. Jena Gauss             -is now WBAT on B/L LE's if pain free- is walking 14: COVID-positive pneumonia: Continue dexamethasone 6 mg daily through 12/17 - is off airborne precautions.  15. Limited DNR   I spent >11mins performing patient care related activities, including face to face time, documentation time, med management, discussion of meds with patient/family and staff, and overall coordination of care.   LOS: 1 days A FACE TO FACE EVALUATION WAS PERFORMED  [address] 07/05/2023, 8:19 AM

## 2023-07-05 NOTE — Progress Notes (Signed)
MD order to d/c COVID precautions.

## 2023-07-05 NOTE — Progress Notes (Signed)
Patient confused tonight stating he sprained his RT ankle. Nurse notes this is a chronic issue with his RT ankle. Patient also states unable to sleep. Nurse administered Ativan PRN to patient.

## 2023-07-05 NOTE — Evaluation (Signed)
Speech Language Pathology Assessment and Plan  Patient Details  Name: Louis Juarez. 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 Bilateral pleural effusions,  Question mild subpleural reticulation, indicative of fibrosis.  Coronary artery calcification. 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 Wall thickness was increased in a pattern of mild LVH.- Aortic  valve: AV is thckened, calcified with no signifi stenosis.The atrium was severely dilated.: Systolic function was moderately reduced.The atrium was mildly dilated.PA peak pre   Nonischemic cardiomyopathy (HCC)    Palpitations    Pneumonia    PVC (premature ventricular contraction) 07/02/2011   PVC's (premature ventricular contractions)    Wheezing 04/29/2011   Sinus ct 05/2011:    Wheezing    Past Surgical History:  Past Surgical History:  Procedure Laterality Date   BI-VENTRICULAR IMPLANTABLE CARDIOVERTER DEFIBRILLATOR N/A 02/13/2012   Procedure: BI-VENTRICULAR IMPLANTABLE CARDIOVERTER DEFIBRILLATOR  (CRT-D);  Surgeon: Hillis Range, MD;  Location: [facility] CATH LAB;  Service: Cardiovascular;  Laterality: N/A;   BI-VENTRICULAR IMPLANTABLE CARDIOVERTER DEFIBRILLATOR  (CRT-D)  02/13/2012   BI-VENTRICULAR IMPLANTABLE CARDIOVERTER DEFIBRILLATOR  (CRT-D)  05/03/2019   CHANGEOUT   BIV ICD GENERATOR CHANGEOUT N/A 05/03/2019   Procedure: BIV ICD GENERATOR CHANGEOUT;  Surgeon: Hillis Range, MD;  Location: [facility] INVASIVE CV LAB;  Service: Cardiovascular;  Laterality: N/A;   CARDIAC CATHETERIZATION  2013   CARDIAC DEFIBRILLATOR PLACEMENT  02/13/2012   [facility] [employer] BIV ICD implanted by Dr Johney Frame   IRRIGATION AND DEBRIDEMENT ELBOW Right 05/08/2023   Procedure: IRRIGATION AND DEBRIDEMENT ELBOW;  Surgeon: Roby Lofts, MD;  Location: [facility] OR;  Service: Orthopedics;  Laterality: Right;   IRRIGATION AND DEBRIDEMENT ELBOW Right 04/24/2023   Procedure: IRRIGATION AND DEBRIDEMENT ELBOW;  Surgeon: Roby Lofts, MD;  Location: [facility] OR;  Service: Orthopedics;  Laterality: Right;   NASAL SINUS SURGERY  93, 97, 2010, 2006   NASAL  SINUS SURGERY     1993, 1997, 2006, 2010   ORIF PELVIC FRACTURE Right 05/08/2023   Procedure: OPEN REDUCTION INTERNAL FIXATION (ORIF) PELVIC FRACTURE;  Surgeon: Roby Lofts, MD;  Location: [facility] OR;  Service: Orthopedics;  Laterality: Right;   ORIF PELVIC FRACTURE WITH PERCUTANEOUS  SCREWS Bilateral 04/24/2023   Procedure: ORIF PELVIC FRACTURE WITH PERCUTANEOUS SCREWS;  Surgeon: Roby Lofts, MD;  Location: [facility] OR;  Service: Orthopedics;  Laterality: Bilateral;    Assessment / Plan / Recommendation Clinical Impression  HPI: Louis Juarez, Louis Juarez is an 82 year old R handed  male who presented to the emergency department on 06/26/2022 from home for altered mental status.  Family stated he was not responding as normal and had fallen several times. (Of note, pt said felt "fine" but just tired and lightheaded.   A right elbow wound was reported.  His medical history is significant for recent hospitalization for hemorrhagic shock from pelvic fractures and and olecranon fracture discharged to skilled nursing facility.  He was discharged from there to home with family.  At home he was taking lorazepam and, gabapentin, Robaxin and oxycodone.  Was found to be COVID positive and admitted and placed on dexamethasone and remdesivir.  Mental status improved.  Lidoderm patch applied for low back pain.  PT and OT evaluations obtained.  Restarted patient's lorazepam and gabapentin to prevent any withdrawal from central acting drugs.  Sertraline also continued.  He was to follow-up with Dr. Jena Gauss for his pelvic ring fracture status post percutaneous fixation- is now WBAT B/L LE's.  .  Eliquis 2.5 mg BID initiated 12/08 for DVT prophylaxis.  Continue dexamethasone through 12/17.  Tolerating regular diet. The patient requires inpatient medicine and rehabilitation evaluations and services for ongoing dysfunction secondary to debility.   Skilled Therapeutic Interventions          Pt familiar to SLP team from recent prior admission. Per EMR, pt has baseline cognitive deficits. He participated in a cognitive-linguistic assessment during last admission and declined formal cognitive intervention or follow up. MBSS completed 05/27/23 during last admission with recommendation of Dysphagia 2 diet and thin liquids  (no straw). Audible aspiration was noted with straw sip of thin liquid only. Today, pt reported dysphagia has resolved and he has not had any further concerns for aspiration with eating/drinking.   Today, Pt presents with a functional oropharyngeal swallow per clinical swallow assessment completed today. Oral prep and transit prompt with complete oral clearance. Pharyngeal swallow initiation appeared timely with laryngeal elevation noted. No overt or subtle s/s of aspiration noted across consistencies.   Recommend continue current diet at tolerated and PO meds as tolerated. No SLP needs identified at this time. SLP will sign off. Please re-consult if pt exhibits concerns for aspiration with PO intake.    SLP Assessment  Patient does not need any further Speech Lanaguage Pathology Services    Recommendations  SLP Diet Recommendations: Age appropriate regular solids;Thin Liquid Administration via: Cup;Straw Medication Administration: Whole meds with liquid Supervision: Patient able to self feed Postural Changes and/or Swallow Maneuvers: Seated upright 90 degrees Oral Care Recommendations: Oral care BID Patient destination: Home Follow up Recommendations: None Equipment Recommended: None recommended by SLP    SLP Frequency   D/c SLP services      Pain Pain Assessment Pain Scale: 0-10 Pain Score: 0-No pain Faces Pain Scale: Hurts a little bit Pain Type: Acute pain Pain Location: Leg Pain Orientation: Right Pain Descriptors / Indicators: Discomfort;Grimacing;Guarding Pain Onset: With Activity  Pain Intervention(s): Repositioned;Shower  Prior Functioning Cognitive/Linguistic Baseline: Baseline deficits Type of Home: House  Lives With: Spouse Available Help at Discharge: Family;Available 24 hours/day Vocation: Retired  Architectural technologist Overall Cognitive Status: Impaired/Different from baseline Arousal/Alertness: Awake/alert Orientation Level: Oriented to place;Oriented  to person;Oriented to time Year: 2024 Month: December Day of Week: Correct Memory: Impaired Memory Impairment: Retrieval deficit;Storage deficit;Decreased short term memory Awareness: Impaired Problem Solving: Impaired Safety/Judgment: Impaired  Comprehension   Expression Written Expression Dominant Hand: Right Oral Motor    Care Tool Care Tool Cognition Ability to hear (with hearing aid or hearing appliances if normally used Ability to hear (with hearing aid or hearing appliances if normally used): 1. Minimal difficulty - difficulty in some environments (e.g. when person speaks softly or setting is noisy)   Expression of Ideas and Wants Expression of Ideas and Wants: 4. Without difficulty (complex and basic) - expresses complex messages without difficulty and with speech that is clear and easy to understand   Understanding Verbal and Non-Verbal Content Understanding Verbal and Non-Verbal Content: 4. Understands (complex and basic) - clear comprehension without cues or repetitions  Memory/Recall Ability Memory/Recall Ability : That he or she is in a hospital/hospital unit;Current season    Bedside Swallowing Assessment General Date of Onset: 06/27/23 Previous Swallow Assessment: MBS 05/27/23 Diet Prior to this Study: Dysphagia 3 (mechanical soft);Thin liquids (Level 0) Temperature Spikes Noted: No Respiratory Status: Room air Behavior/Cognition: Alert;Cooperative Oral Cavity - Dentition: Dentures, top;Dentures, bottom Self-Feeding Abilities: Able to feed self Vision: Functional for self-feeding Patient Positioning: Upright in bed Baseline Vocal Quality: Normal Volitional Swallow: Able to elicit  Oral Care Assessment Oral Assessment  (WDL): Exceptions to WDL Lips: Symmetrical Teeth: Partial plate upper;Partial plate lower Tongue: Pink;Moist Mucous Membrane(s): Moist;Pink Saliva: Moist, saliva free flowing Level of Consciousness: Alert Is patient on any of following O2  devices?: None of the above Nutritional status: No high risk factors Oral Assessment Risk : Low Risk Ice Chips Ice chips: Not tested Thin Liquid Thin Liquid: Within functional limits Presentation: Cup;Self Fed;Straw Nectar Thick Nectar Thick Liquid: Not tested Honey Thick Honey Thick Liquid: Not tested Puree Puree: Within functional limits Presentation: Self Fed Solid Solid: Within functional limits Presentation: Self Fed BSE Assessment Risk for Aspiration Impact on safety and function: No limitations    Refer to Care Plan for Long Term Goals  Recommendations for other services: None   Discharge Criteria: Patient will be discharged from SLP if patient refuses treatment 3 consecutive times without medical reason, if treatment goals not met, if there is a change in medical status, if patient makes no progress towards goals or if patient is discharged from hospital.  The above assessment, treatment plan, treatment alternatives and goals were discussed and mutually agreed upon: by patient  Ellery Plunk 07/05/2023, 3:15 PM

## 2023-07-05 NOTE — Evaluation (Addendum)
Physical Therapy Assessment and Plan  Patient Details  Name: Louis Juarez. 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 Bilateral pleural effusions,  Question mild subpleural reticulation, indicative of fibrosis.  Coronary artery calcification. 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 Wall thickness was increased in a pattern of mild LVH.- Aortic valve: AV is thckened, calcified with no signifi stenosis.The atrium was severely dilated.: Systolic function was moderately reduced.The atrium was mildly dilated.PA peak pre   Nonischemic cardiomyopathy (HCC)    Palpitations    Pneumonia    PVC (premature ventricular contraction) 07/02/2011   PVC's (premature ventricular contractions)    Wheezing 04/29/2011   Sinus ct 05/2011:    Wheezing    Past Surgical History:  Past Surgical History:  Procedure Laterality Date   BI-VENTRICULAR IMPLANTABLE CARDIOVERTER DEFIBRILLATOR N/A 02/13/2012   Procedure: BI-VENTRICULAR IMPLANTABLE CARDIOVERTER DEFIBRILLATOR  (CRT-D);  Surgeon: Hillis Range, MD;  Location: [facility] CATH LAB;  Service: Cardiovascular;  Laterality: N/A;   BI-VENTRICULAR IMPLANTABLE CARDIOVERTER DEFIBRILLATOR  (CRT-D)  02/13/2012   BI-VENTRICULAR IMPLANTABLE CARDIOVERTER DEFIBRILLATOR  (CRT-D)  05/03/2019   CHANGEOUT   BIV ICD GENERATOR CHANGEOUT N/A 05/03/2019   Procedure: BIV ICD GENERATOR CHANGEOUT;  Surgeon: Hillis Range, MD;  Location: [facility] INVASIVE CV LAB;  Service: Cardiovascular;  Laterality: N/A;   CARDIAC CATHETERIZATION  2013   CARDIAC DEFIBRILLATOR PLACEMENT  02/13/2012   SJM [employer] BIV ICD implanted by Dr Johney Frame   IRRIGATION AND DEBRIDEMENT ELBOW Right 05/08/2023   Procedure: IRRIGATION AND DEBRIDEMENT ELBOW;  Surgeon: Roby Lofts, MD;  Location: [facility] OR;  Service: Orthopedics;  Laterality: Right;   IRRIGATION AND DEBRIDEMENT ELBOW Right 04/24/2023   Procedure: IRRIGATION AND DEBRIDEMENT ELBOW;  Surgeon: Roby Lofts, MD;  Location: [facility] OR;  Service: Orthopedics;  Laterality: Right;   NASAL SINUS SURGERY  93, 97, 2010, 2006   NASAL SINUS SURGERY     1993, 1997, 2006, 2010   ORIF PELVIC FRACTURE Right 05/08/2023   Procedure: OPEN REDUCTION INTERNAL FIXATION (ORIF) PELVIC FRACTURE;  Surgeon: Roby Lofts, MD;  Location: [facility] OR;   Service: Orthopedics;  Laterality: Right;   ORIF PELVIC FRACTURE WITH PERCUTANEOUS SCREWS Bilateral 04/24/2023   Procedure: ORIF PELVIC FRACTURE WITH PERCUTANEOUS SCREWS;  Surgeon: Roby Lofts, MD;  Location: [facility] OR;  Service: Orthopedics;  Laterality: Bilateral;    Assessment & Plan Clinical Impression: Louis Juarez is an 82 year old R handed  male who presented to the emergency department on 06/26/2022 from home for altered mental status.  Family stated he was not responding as normal and had fallen several times. (Of note, pt said felt "fine" but just tired and lightheaded.   A right elbow wound was reported.  His medical history is significant for recent hospitalization for hemorrhagic shock from pelvic fractures and and olecranon fracture with CIR stay and discharged home with family.   At home he was taking lorazepam and, gabapentin, Robaxin and oxycodone.  Was found to be COVID positive and admitted and placed on dexamethasone and remdesivir.  Mental status improved.  Lidoderm patch applied for low back pain.  PT and OT evaluations obtained.  Restarted patient's lorazepam and gabapentin to prevent any withdrawal from central acting drugs.  Sertraline also continued.  He was to follow-up with Dr. Jena Gauss for his pelvic ring fracture status post percutaneous fixation- is now WBAT B/L LE's.  .  Eliquis 2.5 mg BID initiated 12/08 for DVT prophylaxis.  Continue dexamethasone through 12/17.  Tolerating regular diet. The patient requires inpatient medicine and rehabilitation evaluations and services for ongoing dysfunction secondary to debility.   Patient currently requires min with mobility secondary to muscle weakness and muscle joint tightness, decreased cardiorespiratoy endurance, decreased memory, and decreased sitting balance, decreased standing balance, and decreased balance strategies.  Prior to hospitalization, patient was supervision with mobility and lived with Spouse in a House home.   Home access is 3Ramped entrance.  Patient will benefit from skilled PT intervention to maximize safe functional mobility, minimize fall risk, and decrease caregiver burden for planned discharge home with 24 hour supervision/CGA  Anticipate patient will benefit from follow up [facility] at discharge.  PT - End of Session Activity Tolerance: Tolerates 30+ min activity with multiple rests Endurance Deficit: Yes Endurance Deficit Description: impaired PT Assessment Rehab Potential (ACUTE/IP ONLY): Good PT Barriers to Discharge: Wound Care;Behavior PT Barriers to Discharge Comments: pain, self limiting behavior PT Patient demonstrates impairments in the following area(s): Balance;Behavior;Edema;Endurance;Motor;Pain;Safety;Sensory PT Transfers Functional Problem(s): Bed Mobility;Bed to Chair;Car;Furniture PT Locomotion Functional Problem(s): Ambulation;Wheelchair Mobility PT Plan PT Intensity: Minimum of 1-2 x/day ,45 to 90 minutes PT Frequency: 5 out of 7 days PT Duration Estimated Length of Stay: 7-10 days PT Treatment/Interventions: Community education officer;Neuromuscular re-education;Psychosocial support;UE/LE Strength taining/ROM;Stair training;Wheelchair propulsion/positioning;Balance/vestibular training;Discharge planning;Functional electrical stimulation;Pain management;Skin care/wound management;Therapeutic Activities;UE/LE Coordination activities;Cognitive remediation/compensation;Functional mobility training;Patient/family education;Therapeutic Exercise;Splinting/orthotics PT Recommendation Follow Up Recommendations: Home health PT;24 hour supervision/assistance Patient destination: Home Equipment Recommended: To be determined Equipment Details: pt has RW, WC, rollator, ramp, BSC, shower chair   PT Evaluation Precautions/Restrictions Precautions Precautions: Fall Restrictions Weight Bearing Restrictions Per Provider Order: Yes RUE Weight Bearing Per  Provider Order: Weight bearing as tolerated RLE Weight Bearing Per Provider Order: Weight bearing as tolerated LLE Weight Bearing Per Provider Order: Weight bearing as tolerated Other Position/Activity Restrictions: "Patient okay to advance  to BLE weightbearing for transfers as long as he is not having pain.  If patient develops pain when weightbearing, should go back to nonweightbearing" per Ortho note 06/30/23 by Thyra Breed, PA-C. Pain Interference Pain Interference Pain Effect on Sleep: 1. Rarely or not at all Pain Interference with Therapy Activities: 1. Rarely or not at all Pain Interference with Day-to-Day Activities: 1. Rarely or not at all Home Living/Prior Functioning Home Living Available Help at Discharge: Family;Available 24 hours/day Type of Home: House Home Access: Ramped entrance Entrance Stairs-Number of Steps: 3 Entrance Stairs-Rails: Can reach both Home Layout: Able to live on main level with bedroom/bathroom Alternate Level Stairs-Number of Steps: does not go upstairs` Bathroom Shower/Tub: Walk-in shower;Tub/shower unit Teacher, early years/pre: Yes Additional Comments: home layout and PLOF taken from pt wife  Lives With: Spouse Prior Function Level of Independence: Needs assistance with homemaking;Needs assistance with ADLs;Needs assistance with tranfers;Needs assistance with gait  Able to Take Stairs?: No Driving: No Vocation: Retired Vision/Perception  Vision - History Ability to See in Adequate Light: 0 Adequate Perception Perception: Within Functional Limits Praxis Praxis: WFL  Cognition Overall Cognitive Status: Impaired/Different from baseline Arousal/Alertness: Awake/alert Orientation Level: Oriented to place;Oriented to person;Oriented to time Year: 2024 Month: December Day of Week: Correct Memory: Impaired Memory Impairment: Retrieval deficit;Storage deficit;Decreased short term memory Awareness: Impaired Problem  Solving: Impaired Safety/Judgment: Impaired Sensation Sensation Light Touch: Appears Intact Hot/Cold: Appears Intact Proprioception: Appears Intact Stereognosis: Appears Intact Coordination Gross Motor Movements are Fluid and Coordinated: No Fine Motor Movements are Fluid and Coordinated: Yes Coordination and Movement Description: deficits due to pain in R LE, and self limiting behavior 2/2 fear of falling Motor  Motor Motor: Within Functional Limits   Trunk/Postural Assessment  Cervical Assessment Cervical Assessment: Exceptions to [facility] (forward head) Thoracic Assessment Thoracic Assessment: Exceptions to [facility] (rounded shoulders) Lumbar Assessment Lumbar Assessment: Exceptions to [facility] (posterior pelvice tilt) Postural Control Postural Control: Deficits on evaluation Righting Reactions: delayed Protective Responses: delayed  Balance Balance Balance Assessed: Yes Static Sitting Balance Static Sitting - Balance Support: Bilateral upper extremity supported;Feet supported Static Sitting - Level of Assistance: 5: Stand by assistance Dynamic Sitting Balance Dynamic Sitting - Balance Support: During functional activity;No upper extremity supported Dynamic Sitting - Level of Assistance: 5: Stand by assistance Static Standing Balance Static Standing - Balance Support: Bilateral upper extremity supported;During functional activity Static Standing - Level of Assistance: 3: Mod assist Dynamic Standing Balance Dynamic Standing - Balance Support: Bilateral upper extremity supported;During functional activity Dynamic Standing - Level of Assistance: 3: Mod assist Extremity Assessment  RUE Assessment RUE Assessment: Exceptions to [facility] LUE Assessment LUE Assessment: Within Functional Limits RLE Assessment RLE Assessment: Exceptions to [facility] General Strength Comments: grossly 3+/5 LLE Assessment LLE Assessment: Exceptions to [facility] General Strength Comments: grossly 4/5  Care Tool Care  Tool Bed Mobility Roll left and right activity   Roll left and right assist level: Supervision/Verbal cueing    Sit to lying activity   Sit to lying assist level: Supervision/Verbal cueing    Lying to sitting on side of bed activity   Lying to sitting on side of bed assist level: the ability to move from lying on the back to sitting on the side of the bed with no back support.: Supervision/Verbal cueing     Care Tool Transfers Sit to stand transfer   Sit to stand assist level: Moderate Assistance - Patient 50 - 74%    Chair/bed transfer   Chair/bed transfer assist  level: Moderate Assistance - Patient 50 - 74%    Car transfer   Car transfer assist level: Moderate Assistance - Patient 50 - 74%      Care Tool Locomotion Ambulation   Assist level: Minimal Assistance - Patient > 75% Assistive device: Walker-rolling Max distance: 20 feet  Walk 10 feet activity   Assist level: 2 helpers (+2 for WC follow) Assistive device: Walker-rolling   Walk 50 feet with 2 turns activity Walk 50 feet with 2 turns activity did not occur: Safety/medical concerns      Walk 150 feet activity Walk 150 feet activity did not occur: Safety/medical concerns      Walk 10 feet on uneven surfaces activity  Safety/Medical       Stairs Stair activity did not occur: Safety/medical concerns        Walk up/down 1 step activity    Safety/Medical    Walk up/down 4 steps activity Walk up/down 4 steps activity did not occur: Safety/medical concerns      Walk up/down 12 steps activity Walk up/down 12 steps activity did not occur: Safety/medical concerns      Pick up small objects from floor   Pick up small object from the floor assist level: Total Assistance - Patient < 25%    Wheelchair Is the patient using a wheelchair?: Yes Type of Wheelchair: Manual   Wheelchair assist level: Supervision/Verbal cueing Max wheelchair distance: 150  Wheel 50 feet with 2 turns activity   Assist Level:  Supervision/Verbal cueing  Wheel 150 feet activity   Assist Level: Supervision/Verbal cueing    Refer to Care Plan for Long Term Goals  SHORT TERM GOAL WEEK 1 PT Short Term Goal 1 (Week 1): STG=LTG 2/2 ELOS  Recommendations for other services: None   Skilled Therapeutic Intervention Mobility Bed Mobility Bed Mobility: Supine to Sit;Sit to Supine;Rolling Left;Rolling Right Rolling Right: Supervision/verbal cueing Rolling Left: Supervision/Verbal cueing Supine to Sit: Supervision/Verbal cueing (HOB flat, no rails used) Sit to Supine: Supervision/Verbal cueing (HOB flat; no rail used) Transfers Transfers: Sit to Stand;Stand to [employer] Transfers Sit to Stand: Moderate Assistance - Patient 50-74% Stand to Sit: Moderate Assistance - Patient 50-74% Stand Pivot Transfers: Moderate Assistance - Patient 50 - 74% Stand Pivot Transfer Details: Verbal cues for sequencing;Verbal cues for gait pattern;Verbal cues for safe use of DME/AE;Verbal cues for precautions/safety;Verbal cues for technique Lateral/Scoot Transfers: Contact Guard/Touching assist Transfer (Assistive device): Rolling walker Locomotion  Gait Ambulation: Yes Gait Assistance: 2 Helpers (+2 for WC follow) Gait Distance (Feet): 20 Feet Assistive device: Rolling walker Gait Assistance Details: Verbal cues for safe use of DME/AE;Verbal cues for precautions/safety;Verbal cues for technique;Verbal cues for sequencing;Verbal cues for gait pattern;Tactile cues for weight shifting Gait Gait: Yes Gait Pattern: Impaired Gait Pattern: Step-to pattern;Decreased step length - left;Decreased stance time - right;Decreased stride length;Decreased hip/knee flexion - left (R hip extenal rotation) Stairs / Additional Locomotion Stairs: No Wheelchair Mobility Wheelchair Mobility: Yes Wheelchair Assistance: Doctor, general practice: Both upper extremities Wheelchair Parts Management: Needs assistance Distance:  150   Discharge Criteria: Patient will be discharged from PT if patient refuses treatment 3 consecutive times without medical reason, if treatment goals not met, if there is a change in medical status, if patient makes no progress towards goals or if patient is discharged from hospital.  The above assessment, treatment plan, treatment alternatives and goals were discussed and mutually agreed upon: by patient  Pt supine in bed upon arrival. Pt agreeable to therapy. Pt  reports mild chornic R heel/ankle pain.   Evaluation completed (see details above and below) with education on PT POC and goals and individual treatment initiated with focus on transfer/gait training.   Pt wife present to verify pt PLOF. Previous CIR stay 05/2023-pt discharged home with B LE NWBing with min A. Since following up with MD, pt now doing lateral transfer with no AD and supervision. Pt no longer has hospital bed. Per pt wife, Recent hospitalization 12/5 per wife-pt fell and landed on his shoulder while urinating sitting EOB. Pt came to ER this time as as pt wife unable to  wake up pt.    PA present for rounds, Nurse present to apply dressing to R elbow 2/2 drainage-from fall on 12/5   Bed mobility: supervision with bed flat and no bed rails  Bed to chair transfer: lateral scoot transfer with CGA with no AD (bed level to WC), min A for unlevel  Sit to stand and stand pivot transfer with min-mod A with RW-verbal cues provided for sequencing.   Gait x8, x20 feet with RW and min A, verbal/tactile cues provided for upright posture and reciprocal gait.   Pt overall is self limiting 2/2 FOF, pt requires frequent encouragement and reassurance.   Pt supine in bed at end of session (despite therapist encouraging pt to stay up in [facility] between sessions), with bed alarm on and wife in room with all needs within reach.        [facility] [place], [place], DPT  07/05/2023, 4:02 PM

## 2023-07-05 NOTE — Plan of Care (Signed)
  Problem: RH Balance Goal: LTG Patient will maintain dynamic sitting balance (PT) Description: LTG:  Patient will maintain dynamic sitting balance with assistance during mobility activities (PT) Flowsheets (Taken 07/05/2023 1608) LTG: Pt will maintain dynamic sitting balance during mobility activities with:: Supervision/Verbal cueing Goal: LTG Patient will maintain dynamic standing balance (PT) Description: LTG:  Patient will maintain dynamic standing balance with assistance during mobility activities (PT) Flowsheets (Taken 07/05/2023 1608) LTG: Pt will maintain dynamic standing balance during mobility activities with:: Contact Guard/Touching assist   Problem: Sit to Stand Goal: LTG:  Patient will perform sit to stand with assistance level (PT) Description: LTG:  Patient will perform sit to stand with assistance level (PT) Flowsheets (Taken 07/05/2023 1608) LTG: PT will perform sit to stand in preparation for functional mobility with assistance level: Supervision/Verbal cueing   Problem: RH Bed Mobility Goal: LTG Patient will perform bed mobility with assist (PT) Description: LTG: Patient will perform bed mobility with assistance, with/without cues (PT). Flowsheets (Taken 07/05/2023 1608) LTG: Pt will perform bed mobility with assistance level of: Independent   Problem: RH Bed to Chair Transfers Goal: LTG Patient will perform bed/chair transfers w/assist (PT) Description: LTG: Patient will perform bed to chair transfers with assistance (PT). Flowsheets (Taken 07/05/2023 1608) LTG: Pt will perform Bed to Chair Transfers with assistance level: Supervision/Verbal cueing   Problem: RH Car Transfers Goal: LTG Patient will perform car transfers with assist (PT) Description: LTG: Patient will perform car transfers with assistance (PT). Flowsheets (Taken 07/05/2023 1608) LTG: Pt will perform car transfers with assist:: Supervision/Verbal cueing   Problem: RH Furniture Transfers Goal: LTG  Patient will perform furniture transfers w/assist (OT/PT) Description: LTG: Patient will perform furniture transfers  with assistance (OT/PT). Flowsheets (Taken 07/05/2023 1608) LTG: Pt will perform furniture transfers with assist:: Contact Guard/Touching assist   Problem: RH Ambulation Goal: LTG Patient will ambulate in controlled environment (PT) Description: LTG: Patient will ambulate in a controlled environment, # of feet with assistance (PT). Flowsheets (Taken 07/05/2023 1608) LTG: Pt will ambulate in controlled environ  assist needed:: Contact Guard/Touching assist LTG: Ambulation distance in controlled environment: 150 feet with LRAD Goal: LTG Patient will ambulate in home environment (PT) Description: LTG: Patient will ambulate in home environment, # of feet with assistance (PT). Flowsheets (Taken 07/05/2023 1608) LTG: Pt will ambulate in home environ  assist needed:: Contact Guard/Touching assist LTG: Ambulation distance in home environment: 75 feet with LRAD

## 2023-07-06 ENCOUNTER — Inpatient Hospital Stay (HOSPITAL_COMMUNITY): Payer: BC Managed Care – PPO

## 2023-07-06 DIAGNOSIS — N400 Enlarged prostate without lower urinary tract symptoms: Secondary | ICD-10-CM

## 2023-07-06 DIAGNOSIS — D72829 Elevated white blood cell count, unspecified: Secondary | ICD-10-CM

## 2023-07-06 DIAGNOSIS — I5020 Unspecified systolic (congestive) heart failure: Secondary | ICD-10-CM

## 2023-07-06 LAB — CBC
HCT: 43.3 % (ref 39.0–52.0)
Hemoglobin: 14.4 g/dL (ref 13.0–17.0)
MCH: 31 pg (ref 26.0–34.0)
MCHC: 33.3 g/dL (ref 30.0–36.0)
MCV: 93.1 fL (ref 80.0–100.0)
Platelets: 272 10*3/uL (ref 150–400)
RBC: 4.65 MIL/uL (ref 4.22–5.81)
RDW: 14.1 % (ref 11.5–15.5)
WBC: 12.5 10*3/uL — ABNORMAL HIGH (ref 4.0–10.5)
nRBC: 0 % (ref 0.0–0.2)

## 2023-07-06 LAB — COMPREHENSIVE METABOLIC PANEL
ALT: 23 U/L (ref 0–44)
AST: 17 U/L (ref 15–41)
Albumin: 2.8 g/dL — ABNORMAL LOW (ref 3.5–5.0)
Alkaline Phosphatase: 102 U/L (ref 38–126)
Anion gap: 9 (ref 5–15)
BUN: 15 mg/dL (ref 8–23)
CO2: 27 mmol/L (ref 22–32)
Calcium: 9.1 mg/dL (ref 8.9–10.3)
Chloride: 101 mmol/L (ref 98–111)
Creatinine, Ser: 0.73 mg/dL (ref 0.61–1.24)
GFR, Estimated: >60 mL/min (ref >60)
Glucose, Bld: 153 mg/dL — ABNORMAL HIGH (ref 70–99)
Potassium: 3.5 mmol/L (ref 3.5–5.1)
Sodium: 137 mmol/L (ref 135–145)
Total Bilirubin: 0.7 mg/dL (ref <1.2)
Total Protein: 6.5 g/dL (ref 6.5–8.1)

## 2023-07-06 NOTE — Progress Notes (Signed)
Inpatient Rehabilitation  Patient information reviewed and entered into eRehab system by Demarrio Menges Gentry, OTR/L, Rehab Quality Coordinator.   Information including medical coding, functional ability and quality indicators will be reviewed and updated through discharge.   

## 2023-07-06 NOTE — Progress Notes (Signed)
Occupational Therapy Session Note  Patient Details  Name: Louis Juarez. MRN: [id number] Date of Birth: 1940-08-12  Today's Date: 07/06/2023 OT Missed Time: 60 Minutes Missed Time Reason: Patient unwilling/refused to participate without medical reason;Pain   Short Term Goals: Week 1:  OT Short Term Goal 1 (Week 1): STG=LTGS (due to ELOS)  Skilled Therapeutic Interventions/Progress Updates:  Pt greeted resting in bed, LPN in room administering medications. Pt declines need to participate in ADLs, stating ". . . I already got all that done. . ". OT encouraging OOB activity with target goal of going to therapy gym, pt states ". . . I pulled a muscle in my back and I don't want to hurt it more. . ". Pt re-educated on the purpose of CIR and re-oriented to scheduled sessions, pt ultimately refuses session.   Therapy Documentation Precautions:  Precautions Precautions: Fall Restrictions Weight Bearing Restrictions Per Provider Order: Yes RUE Weight Bearing Per Provider Order: Weight bearing as tolerated RLE Weight Bearing Per Provider Order: Weight bearing as tolerated LLE Weight Bearing Per Provider Order: Weight bearing as tolerated Other Position/Activity Restrictions: "Patient okay to advance to BLE weightbearing for transfers as long as he is not having pain.  If patient develops pain when weightbearing, should go back to nonweightbearing" per Ortho note 06/30/23 by Thyra Breed, PA-C.   Therapy/Group: Individual Therapy  [name], OTR/L, MSOT  07/06/2023, 6:13 AM

## 2023-07-06 NOTE — Progress Notes (Signed)
Patient very confused this morning. Patient thought he was in a hotel and asked nurse when check out was and if the taxis were downstairs. Nurse attempted to reorient patient. Patient had soiled blankets and sheets this morning and refused to let nurse change the bed. Patient refused blood draw and vitals at 0500 this morning, stating it was too early. NT is attempting again.

## 2023-07-06 NOTE — Discharge Instructions (Addendum)
Inpatient Rehab Discharge Instructions  Sincere Gaye. Discharge date and time: 07/13/2023   Activities/Precautions/ Functional Status: Activity: no lifting, driving, or strenuous exercise until cleared by MD Diet: cardiac diet Wound Care: none needed Functional status:  ___ No restrictions     ___ Walk up steps independently ___ 24/7 supervision/assistance   ___ Walk up steps with assistance ___ Intermittent supervision/assistance  ___ Bathe/dress independently ___ Walk with walker     ___ Bathe/dress with assistance ___ Walk Independently    ___ Shower independently ___ Walk with assistance    ___ Shower with assistance ___ No alcohol     ___ Return to work/school ________  Special Instructions: No driving, alcohol consumption or tobacco use.  Recommend daily BP measurement in same arm and record time of day. Bring this information with you to follow-up appointment with PCP.   COMMUNITY REFERRALS UPON DISCHARGE:    Home Health:   PT  OT  RN                Agency:[facility] Phone:[phone]    Medical Equipment/Items Ordered:HAS ALL EQUIPMENT FROM PREVIOUS ADMIT ONE MONTH AGO                                                 Agency/Supplier:NA     My questions have been answered and I understand these instructions. I will adhere to these goals and the provided educational materials after my discharge from the hospital.  Patient/Caregiver Signature _______________________________ Date __________  Clinician Signature _______________________________________ Date __________  Please bring this form and your medication list with you to all your follow-up doctor's appointments.

## 2023-07-06 NOTE — Progress Notes (Signed)
PROGRESS NOTE   Subjective/Complaints:  No acute events overnight noted however patient has been noted to be noted to confused.  This morning he is focused on his prognosis, perseverating on his therapy schedule.  No additional concerns were elicited.  ROS: as per HPI. Denies fever, chills, CP, SOB, abd pain, N/V/D/C, or any other complaints at this time.    Objective:   No results found. Recent Labs    07/06/23 0938  WBC 12.5*  HGB 14.4  HCT 43.3  PLT 272   Recent Labs    07/06/23 0938  NA 137  K 3.5  CL 101  CO2 27  GLUCOSE 153*  BUN 15  CREATININE 0.73  CALCIUM 9.1        Intake/Output Summary (Last 24 hours) at 07/06/2023 1259 Last data filed at 07/06/2023 1236 Gross per 24 hour  Intake 620 ml  Output 250 ml  Net 370 ml        Physical Exam: Vital Signs Blood pressure (!) 155/87, pulse 61, temperature 98.2 F (36.8 C), resp. rate 18, height 5\' 10"  (1.778 m), weight 75.1 kg, SpO2 96%.  Constitutional:      Appearance: Normal appearance.     Comments: Lying in bed, HENT: HOH    Head: Normocephalic and atraumatic.     Nose: Nose normal.     Mouth/Throat:     Mouth: Mucous membranes are dry.     Pharynx: Oropharynx is clear.  Eyes:     General:        Right eye: No discharge.        Left eye: No discharge.     Extraocular Movements: Extraocular movements intact.  Cardiovascular:     Rate and Rhythm: Normal rate and regular rhythm.     Pulses: Normal pulses.     Heart sounds: Normal heart sounds.  Pulmonary:     Effort: Pulmonary effort is normal. No respiratory distress.     Breath sounds: Normal breath sounds. No wheezing, rhonchi or rales.  Abdominal:     General: Bowel sounds are normal. There is no distension.     Palpations: Abdomen is soft.     Tenderness: There is no abdominal tenderness. Skin:    General: Skin is warm and dry.     Comments: A few bruises on Ue's Psych:  pleasant and cooperative today  PRIOR EXAMS: Musculoskeletal:        General: Normal range of motion.     Cervical back: Neck supple. No rigidity.     Comments: Ue's 5/5 in B/L UE"s  RLE- 4/5 in HF except for R DF 2/5 LLE- 5-/5 throughout-  Pt wasn't clear when got weak in R DF, but admits it's chronic     Neurological:     Mental Status: He is alert.     Comments: Ox2-3, confused and perseverating on his schedule Intact to light touch in all 4 extremities  Psychiatric:     Comments: Frustrated about CIR- and not having exact details   Assessment/Plan: 1. Functional deficits which require 3+ hours per day of interdisciplinary therapy in a comprehensive inpatient rehab setting. Physiatrist is providing close team supervision and  24 hour management of active medical problems listed below. Physiatrist and rehab team continue to assess barriers to discharge/monitor patient progress toward functional and medical goals  Care Tool:  Bathing    Body parts bathed by patient: Right arm, Left arm, Chest, Abdomen, Front perineal area, Right upper leg, Left upper leg, Right lower leg, Left lower leg, Face   Body parts bathed by helper: Buttocks     Bathing assist Assist Level: Minimal Assistance - Patient > 75%     Upper Body Dressing/Undressing Upper body dressing   What is the patient wearing?: Pull over shirt    Upper body assist Assist Level: Set up assist    Lower Body Dressing/Undressing Lower body dressing      What is the patient wearing?: Pants     Lower body assist Assist for lower body dressing: Minimal Assistance - Patient > 75%     Toileting Toileting    Toileting assist Assist for toileting: Minimal Assistance - Patient > 75%     Transfers Chair/bed transfer  Transfers assist     Chair/bed transfer assist level: Moderate Assistance - Patient 50 - 74%     Locomotion Ambulation   Ambulation assist      Assist level: Minimal Assistance -  Patient > 75% Assistive device: Walker-rolling Max distance: 20 feet   Walk 10 feet activity   Assist     Assist level: 2 helpers (+2 for WC follow) Assistive device: Walker-rolling   Walk 50 feet activity   Assist Walk 50 feet with 2 turns activity did not occur: Safety/medical concerns         Walk 150 feet activity   Assist Walk 150 feet activity did not occur: Safety/medical concerns         Walk 10 feet on uneven surface  activity   Assist Walk 10 feet on uneven surfaces activity did not occur: Safety/medical concerns         Wheelchair     Assist Is the patient using a wheelchair?: Yes Type of Wheelchair: Manual    Wheelchair assist level: Supervision/Verbal cueing Max wheelchair distance: 150    Wheelchair 50 feet with 2 turns activity    Assist        Assist Level: Supervision/Verbal cueing   Wheelchair 150 feet activity     Assist      Assist Level: Supervision/Verbal cueing   Blood pressure (!) 155/87, pulse 61, temperature 98.2 F (36.8 C), resp. rate 18, height 5\' 10"  (1.778 m), weight 75.1 kg, SpO2 96%.  Medical Problem List and Plan: 1. Functional deficits secondary to acute encephalopathy             -patient may  shower -ELOS/Goals: 7-10 days- pt wants more more than 5 days - min A to Supervision             Continue CIR- con't CIR PT, OT and SLP   2.  Antithrombotics: -DVT/anticoagulation:  Pharmaceutical: Eliquis 2.5 mg BID             -antiplatelet therapy: none   3. Pain Management: Tylenol as needed, lidoderm patch for LBP   4. Mood/Behavior/Sleep: LCSW to evaluate and provide emotional support             -continue sertraline 25 mg daily  -antipsychotic agents: n/a -Insomnia- was on Seroquel last rehab stay- will restart and melatonin. -07/05/23 issues with insomnia, wife states he was taken off seroquel after last hospital stay, Dr. Jena Gauss started  Trazodone-- looks like maybe 50mg  dosing-- and ativan  1mg  at night.   -will d/c seroquel since not effective  -increase melatonin to 10mg  since that helped last time  -add trazodone 10mg  at bedtime  -has ativan 1mg  PRN dose, advised he would have to ask for it   5. Neuropsych/cognition: This patient is not quite capable of making decisions on his  own behalf.   6. Skin/Wound Care: Routine skin care checks   7. Fluids/Electrolytes/Nutrition: Routine Is and Os and follow-up chemistries   8: Hypertension: monitor TID and prn             -continue carvedilol 25 mg twice daily             -continue spironolactone 25 mg daily  -12/16 intermittently elevated, monitor trend     Vitals:   07/04/23 1506 07/04/23 1955 07/05/23 0607 07/05/23 1330  BP: 127/72 125/78 (!) 152/87 135/65   07/05/23 2014 07/06/23 0646  BP: 134/64 (!) 155/87    9: Hyperlipidemia: continue pravastatin 80mg  daily and Zetia 10mg  daily   10: GERD: continue protonix 20mg  daily   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 ??: continue Flomax 0.4 mg with supper  -12/16 has been continent of bladder continue to monitor   12: HFrEF/chronic systolic CHF/nonischemic cardiomyopathy             -continue carvedilol 25 mg twice daily             -continue spironolactone 25 mg daily             -lasix held for now-- not overloaded appearing, monitor for need  -Monitor daily weights  Filed Weights   07/04/23 1506 07/06/23 0525  Weight: 75.4 kg 75.1 kg      13: Pelvic ring fracture status post percutaneous fixation prior to presentation             -Follow-up with Dr. Jena Gauss             -is now WBAT on B/L LE's if pain free- is walking 14: COVID-positive pneumonia: Continue dexamethasone 6 mg daily through 12/17 - is off airborne precautions.  15. Limited DNR 16.  Leukocytosis.  Likely due to dexamethasone.  Monitor for signs of infection     LOS: 2 days A FACE TO FACE EVALUATION WAS PERFORMED  Fanny Dance 07/06/2023, 12:59 PM

## 2023-07-06 NOTE — Plan of Care (Addendum)
Downgraded gait goals to min A as pt requiring heavy mod A for cognition and safety with RW  Problem: RH Ambulation Goal: LTG Patient will ambulate in controlled environment (PT) Description: LTG: Patient will ambulate in a controlled environment, # of feet with assistance (PT). Flowsheets (Taken 07/06/2023 1356) LTG: Pt will ambulate in controlled environ  assist needed:: Minimal Assistance - Patient > 75% Goal: LTG Patient will ambulate in home environment (PT) Description: LTG: Patient will ambulate in home environment, # of feet with assistance (PT). Flowsheets (Taken 07/06/2023 1353) LTG: Pt will ambulate in home environ  assist needed:: Minimal Assistance - Patient > 75%

## 2023-07-06 NOTE — Progress Notes (Addendum)
Physical Therapy Session Note  Patient Details  Name: Louis Juarez. MRN: [id number] Date of Birth: 01-18-1941  Today's Date: 07/06/2023 PT Individual Time: 1001-1056, 1300-1343, [id number] PT Individual Time Calculation (min): 55 min, 43 min, 41 min     Short Term Goals: Week 1:  PT Short Term Goal 1 (Week 1): STG=LTG 2/2 ELOS  Skilled Therapeutic Interventions/Progress Updates:      Treatment Session 1  Pt supine in bed upon arrival. Pt agreeable to therpay. Pt reports 5/10 pain in R ankle with weight bearing.    Supine to sit with supervision, stand step transfer with RW and CGA for power up and mod A for pivot verbal cues provided for safety and sequencing with emphasis on posiitoning of B LE within frame of RW.   Pt ambulated 1x32, 1x20  feet with RW and mod A for management of RW for safety. Pt overall demos increased unsteadiness today with gait, with increased external rotation of R hip, and L knee flexion. Therapist provided demonstration of safety with RW, pt able to provide verbal teach back of "keep walker closer" but demos little to no carryover, verbal cues provided to slow down.   Pt attempted gait with R UE assist on hallway rail and L HHA with +2 for WC follow however pt unable to tolerate 2/2 R lateral ankle pain, pain upon palpation, pt wife reports chronic pain, pt under impression ankle sprain occurred last week, therapist attempted to reproduce pain with active motion, pt unable to perform ankle eversion/inversion-no reproduction of symptoms.   Squat pivot transfer with CGA/light min A x2 to mat table, verbal cues provided for technique,  stand pivot transfer to [facility] with use of grab bars and min A. Pt continent of urine. Pt donned and doffed pants with total A while pt stood with CGA/min A.   Pt seated in recliner at end of session with all needs within reach and seatbelt alarm on.   Treatment Session 2   Pt supine in bed upon arrival. Pt initially refusing  therapy 2/2 congestion and R ankle pain, therapist noted R ankle mild swelling, pain with palpation on R malleoli, and pain with ankle eversion. Notified nurse and MD. Nurse present to administer pain medicine and cough syrup. Pt reports R ankle pain with weight bearing and R hip pain with therex, not quantiified.   With encouragement pt agreeable to bed level activity, pt performed the following therex: verbal/tactile cues provided for technqiue and to reduce compensation   1x10 SLR B active assisted with R LE   1x10 supine hip abduction B active assisted with R LE  1x10 hip internal rotation x10  1x10 glute bridge  1x10 LAQ  1x10 seated marching B   1x10 ankle pumps-unable to raise toes on R LE actively, performed passively on R LE  1x10 glute sets holding for 3 seconds  Pt performed sit to stand x10 from raised hosital bed with L UE support on bed rail, and R UE support on bed with min A, verbal and tactile cues provided for upright posture and R LE positioning, Pt heavily favoring L LE.   Pt performed lateral scooting to [facility] with supervision, and supine to sit with supervision.   Pt supine in bed with bed alarm on and needs within reach.   Treatment Session 3   Pt supine in bed upon arrival. Pt agreeable to therapy. Pt reports 6/10 R ankle pain, therpaist provided rest and repositioning as needed.  Pt performed sit to stand with RW and CGA/light min A.   Pt performed mass practice (~6 trials each direction) of short distance ambulation x7 feet at a time (WC with RW) and performed ambulatory transfers to [facility] and bed with min-mod A with max cues for sequencing and safety, especially with navigating turns. Pt demos improved receptivitity to cues this afternoon in comparison to this AM, but pt continues to require max cues for implementation for safety. Seated rest breaks provided between each trial.   Pt supine in bed at end of session with all needs within reach and bed alarm on.             Therapy Documentation Precautions:  Precautions Precautions: Fall Restrictions Weight Bearing Restrictions Per Provider Order: Yes RUE Weight Bearing Per Provider Order: Weight bearing as tolerated RLE Weight Bearing Per Provider Order: Weight bearing as tolerated LLE Weight Bearing Per Provider Order: Weight bearing as tolerated Other Position/Activity Restrictions: "Patient okay to advance to BLE weightbearing for transfers as long as he is not having pain.  If patient develops pain when weightbearing, should go back to nonweightbearing" per Ortho note 06/30/23 by Thyra Breed, PA-C.  Therapy/Group: Individual Therapy  [facility] [name], [place], DPT  07/06/2023, 7:37 AM

## 2023-07-06 NOTE — Discharge Summary (Signed)
Physician Discharge Summary  Patient ID: Louis Juarez. MRN: [id number] DOB/AGE: [birth year] 82 y.o.  Admit date: 07/04/2023 Discharge date: 07/13/2023  Discharge Diagnoses:  Principal Problem:   Acute encephalopathy Active Problems:   Debility Hypertension Hyperlipidemia Gastroesophageal reflux disease Urinary retention Heart failure with reduced ejection fraction Pelvic ring fracture COVID-positive pneumonia Insomnia Leukocytosis Right ankle pain    Discharged Condition: Stable  Significant Diagnostic Studies: Narrative & Impression  CLINICAL DATA:  Ankle pain.   EXAM: RIGHT ANKLE - COMPLETE 3+ VIEW   COMPARISON:  Ankle radiograph 10/11/2013   FINDINGS: No fracture. Normal alignment, no dislocation. The ankle mortise is preserved. Mild tibial talar spurring. Corticated density adjacent to the lateral malleolus is chronic and may represent sequela of remote injury. There is irregularity of the calcaneus primarily involving the plantar surface with undulation. There is also irregularity of the subtalar joint that has a chronic appearance. No ankle joint effusion. There are prominent vascular calcifications.   IMPRESSION: 1. Irregularity of the calcaneus primarily involving the plantar surface with undulation. This may be sequela of remote injury or neuropathic change. 2. Irregularity of the subtalar joint has a chronic appearance. 3. Mild tibiotalar spurring. 4. Prominent vascular calcifications.     Electronically Signed   By: Narda Rutherford M.D.   On: 07/06/2023 17:56     Labs:  Basic Metabolic Panel: Recent Labs  Lab 07/06/23 0938 07/09/23 1211  NA 137 135  K 3.5 3.8  CL 101 100  CO2 27 27  GLUCOSE 153* 169*  BUN 15 12  CREATININE 0.73 0.68  CALCIUM 9.1 8.9    CBC: Recent Labs  Lab 07/06/23 0938 07/09/23 1211  WBC 12.5* 11.8*  HGB 14.4 14.4  HCT 43.3 43.6  MCV 93.1 92.8  PLT 272 252     Brief HPI:   Louis Juarez.  is a 82 y.o. male who presented to the emergency department on 06/26/2022 from home for altered mental status.  Family stated he was not responding as normal and had fallen several times. (Of note, pt said felt "fine" but just tired and lightheaded.   A right elbow wound was reported.  His medical history is significant for recent hospitalization for hemorrhagic shock from pelvic fractures and and olecranon fracture discharged to skilled nursing facility.  He was discharged from there to home with family.  At home he was taking lorazepam and, gabapentin, Robaxin and oxycodone.  Was found to be COVID positive and admitted and placed on dexamethasone and remdesivir.  Mental status improved.  Lidoderm patch applied for low back pain.  PT and OT evaluations obtained.  Restarted patient's lorazepam and gabapentin to prevent any withdrawal from central acting drugs.  Sertraline also continued.  He was to follow-up with Dr. Jena Gauss for his pelvic ring fracture status post percutaneous fixation- is now WBAT B/L LE's. Eliquis 2.5 mg BID initiated 12/08 for DVT prophylaxis.  Continue dexamethasone through 12/17.  Tolerating regular diet.    Hospital Course: Louis Juarez. was admitted to rehab 07/04/2023 for inpatient therapies to consist of PT, ST and OT at least three hours five days a week. Past admission physiatrist, therapy team and rehab RN have worked together to provide customized collaborative inpatient rehab. Seroquel and melatonin started at admission, but not effective so increased melatonin to 10 mg and added trazodone 50 mg q HS. Developed confusion overnight 12/16.  History of heart failure>> Lasix held.  NO signs of fluid overload. Completed dexamethasone 12/17.  Complaining of right ankle pain and x-rays obtained>>no acute fracture or dislocation. Continue brace and Voltaren gel. Chest congestion improved with Mucinex.   Blood pressures were monitored on TID basis and carvedilol 25 mg twice daily and  spironolactone 25 mg daily continued. Fair control.   Rehab course: During patient's stay in rehab weekly team conferences were held to monitor patient's progress, set goals and discuss barriers to discharge. At admission, patient required mod with basic self-care skills and min with mobility.  Physical exam Blood pressure 119/71 pulse 77 temperature 97.8 respiration 16 oxygen saturation is 100% room air Constitutional.  No acute distress HEENT Head.  Normocephalic and atraumatic Eyes.  Pupils round and reactive to light no discharge without nystagmus Neck.  Supple nontender no JVD without thyromegaly Cardiac regular rate and rhythm without any extra sounds or murmur heard Abdomen.  Soft nontender positive bowel sounds without rebound Respiratory effort normal no respiratory distress without wheeze Musculoskeletal.  Normal range of motion Comments.  Upper extremities 5/5 bilateral upper extremities Right lower extremity 4/5 in hip flexors except right dorsiflexion 2/5 Left lower extremity 5 -/5 throughout   He has had improvement in activity tolerance, balance, postural control as well as ability to compensate for deficits. He has had improvement in functional use RUE/LUE  and RLE/LLE as well as improvement in awareness.  Working with energy conservation techniques.  Performs multiple STS and ambulate transfers from edge of bed to wheelchair with all contact-guard plus rolling walker, cueing provided for safety hand placement.  Tub shower transfer with contact-guard plus rolling walker.  Spouse observing and assisting with full body dressing during ADLs set up/supervision for upper body care, contact-guard light minimal assist for lower body care, moderate assist for footwear.  Self propels wheelchair with supervision.  Ambulates 46 feet x 2 rolling walker min mod assist.  Full family teaching completed plan discharge to home  Home health PT, OT,RN arranged through [facility].      Disposition: Home.  06-Home with Home Health care service    Diet: Regular  Special Instructions: No driving, alcohol consumption or tobacco use.  Medications at discharge 1.  Tylenol as needed 2.  Eliquis 2.5 mg p.o. twice daily 3.  Coreg 25 mg p.o. twice daily 4.  Voltaren gel 4 g 4 times daily to affected area 5.  Zetia 10 mg p.o. nightly 6.  Lidoderm patch changes directed 7.  Ativan 1 mg nightly as needed anxiety/sleep 8.  Trazodone 50 mg nightly 9.  Protonix 20 mg p.o. daily 10.  Pravachol 80 mg p.o. nightly 11.  Zoloft 25 mg p.o. daily 12.  Aldactone 25 mg p.o. daily 13.  Flomax 0.4 mg daily after supper 14.  Ventolin inhaler 2 puffs every 6 hours as needed 15.  Vitamin C 500 mg p.o. daily 16.  Multivitamin daily 17.  Vitamin D 2000 units daily 18.  Zinc sulfate 220 mg daily  Follow-up Information     Fanny Dance, MD Follow up.   Specialty: Physical Medicine and Rehabilitation Why: As needed Contact information: [address] [place] [address] [phone]         Corrington, Meredith Mody, MD Follow up.   Specialty: Family Medicine Contact information: [address] [place] [address] [phone]         Roby Lofts, MD Follow up.   Specialty: Orthopedic Surgery Contact information: [address] [place] [address] [phone]  Signed: Mcarthur Rossetti Janece Laidlaw 07/12/2023, 12:00 PM

## 2023-07-06 NOTE — Progress Notes (Signed)
Inpatient Rehabilitation Center Individual Statement of Services  Patient Name:  Estiben Vlad.  Date:  07/06/2023  Welcome to the Inpatient Rehabilitation Center.  Our goal is to provide you with an individualized program based on your diagnosis and situation, designed to meet your specific needs.  With this comprehensive rehabilitation program, you will be expected to participate in at least 3 hours of rehabilitation therapies Monday-Friday, with modified therapy programming on the weekends.  Your rehabilitation program will include the following services:  Physical Therapy (PT), Occupational Therapy (OT), Speech Therapy (ST), 24 hour per day rehabilitation nursing, Therapeutic Recreaction (TR), Neuropsychology, Care Coordinator, Rehabilitation Medicine, Nutrition Services, and Pharmacy Services  Weekly team conferences will be held on Wednesday to discuss your progress.  Your Inpatient Rehabilitation Care Coordinator will talk with you frequently to get your input and to update you on team discussions.  Team conferences with you and your family in attendance may also be held.  Expected length of stay: 7-10 days  Overall anticipated outcome: supervision/CGA level  Depending on your progress and recovery, your program may change. Your Inpatient Rehabilitation Care Coordinator will coordinate services and will keep you informed of any changes. Your Inpatient Rehabilitation Care Coordinator's name and contact numbers are listed  below.  The following services may also be recommended but are not provided by the Inpatient Rehabilitation Center:   Home Health Rehabiltiation Services Outpatient Rehabilitation Services    Arrangements will be made to provide these services after discharge if needed.  Arrangements include referral to agencies that provide these services.  Your insurance has been verified to be:  [place] [employer] and Medicare part A Your primary doctor is:  kip  Corrington  Pertinent information will be shared with your doctor and your insurance company.  Inpatient Rehabilitation Care Coordinator:  Dossie Der, [place] [phone] or Luna Glasgow  Information discussed with and copy given to patient by: [name], 07/06/2023, 1:05 PM

## 2023-07-06 NOTE — Progress Notes (Signed)
Patient ID: Louis Juarez., male   DOB: 01/11/1941, 82 y.o.   MRN: [id number]   Inpatient Rehabilitation Care Coordinator Assessment and Plan Patient Details  Name: Louis Juarez. 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 Bilateral pleural effusions,  Question mild subpleural reticulation, indicative of fibrosis.  Coronary artery calcification. 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 Wall thickness was increased in a pattern of mild LVH.- Aortic valve: AV is thckened, calcified with no signifi stenosis.The atrium was severely dilated.:  Systolic function was moderately reduced.The atrium was mildly dilated.PA peak pre   Nonischemic cardiomyopathy (HCC)     Palpitations     Pneumonia     PVC (premature ventricular contraction) 07/02/2011   PVC's (premature ventricular contractions)     Wheezing 04/29/2011    Sinus ct 05/2011:    Wheezing          Past Surgical History:       Past Surgical History:  Procedure Laterality Date   BI-VENTRICULAR IMPLANTABLE CARDIOVERTER DEFIBRILLATOR N/A 02/13/2012    Procedure: BI-VENTRICULAR IMPLANTABLE CARDIOVERTER DEFIBRILLATOR  (CRT-D);  Surgeon: Hillis Range, MD;  Location: [facility] CATH LAB;  Service: Cardiovascular;  Laterality: N/A;   BI-VENTRICULAR IMPLANTABLE CARDIOVERTER DEFIBRILLATOR  (CRT-D)   02/13/2012   BI-VENTRICULAR IMPLANTABLE CARDIOVERTER DEFIBRILLATOR  (CRT-D)   05/03/2019    CHANGEOUT   BIV ICD GENERATOR CHANGEOUT N/A 05/03/2019    Procedure: BIV ICD GENERATOR CHANGEOUT;  Surgeon: Hillis Range, MD;  Location: [facility] INVASIVE CV LAB;  Service: Cardiovascular;  Laterality: N/A;   CARDIAC CATHETERIZATION   2013   CARDIAC DEFIBRILLATOR PLACEMENT   02/13/2012    S[facility] [employer] BIV ICD implanted by Dr Johney Frame   IRRIGATION AND DEBRIDEMENT ELBOW Right 04/24/2023    Procedure: IRRIGATION AND DEBRIDEMENT ELBOW;  Surgeon: Roby Lofts, MD;  Location: [facility] OR;  Service: Orthopedics;  Laterality: Right;   IRRIGATION AND DEBRIDEMENT ELBOW Right 05/08/2023  Procedure: IRRIGATION AND DEBRIDEMENT ELBOW;  Surgeon: Roby Lofts, MD;  Location: [facility] OR;  Service: Orthopedics;  Laterality: Right;   NASAL SINUS SURGERY   93, 97, 2010, 2006   NASAL SINUS SURGERY        1993, 1997, 2006, 2010   ORIF PELVIC FRACTURE Right 05/08/2023    Procedure: OPEN REDUCTION INTERNAL FIXATION (ORIF) PELVIC FRACTURE;  Surgeon: Roby Lofts, MD;  Location: [facility] OR;  Service: Orthopedics;  Laterality: Right;   ORIF PELVIC FRACTURE WITH PERCUTANEOUS SCREWS Bilateral 04/24/2023    Procedure: ORIF PELVIC FRACTURE  WITH PERCUTANEOUS SCREWS;  Surgeon: Roby Lofts, MD;  Location: [facility] OR;  Service: Orthopedics;  Laterality: Bilateral;        Social History:  reports that he quit smoking about 21 years ago. His smoking use included cigarettes. He started smoking about 41 years ago. He has a 30 pack-year smoking history. He has never used smokeless tobacco. He reports that he does not drink alcohol and does not use drugs.   Family / Support Systems Marital Status: Married Patient Roles: Spouse, Parent Spouse/Significant Other: Dois Davenport [phone] Children: Son in [place] whois coming to assist Mom with Dad's care at discharge Other Supports: Friends and church members Anticipated Caregiver: Wife and son Ability/Limitations of Caregiver: Wife works but plans on [employer] a FMLA to be available and son coming from [place] to assist Caregiver Availability: 24/7 Family Dynamics: Close with family and friends, he feels he has good supports and his needs will be met at discharge.   Social History Preferred language: English Religion: Catholic Cultural Background: No issues Education: Charity fundraiser - How often do you need to have someone help you when you read instructions, pamphlets, or other written material from your doctor or pharmacy?: Never Writes: Yes Employment Status: Retired Marine scientist Issues: No issues Guardian/Conservator: None-according to MD pt is capable of making his own decisions while here. Wife comes daily at 52 will see her when here today    Abuse/Neglect Abuse/Neglect Assessment Can Be Completed: Yes Physical Abuse: Denies Verbal Abuse: Denies Sexual Abuse: Denies Exploitation of patient/patient's resources: Denies Self-Neglect: Denies   Patient response to: Social Isolation - How often do you feel lonely or isolated from those around you?: Rarely   Emotional Status Pt's affect, behavior and adjustment status: Pt is motivated to do what he can do  with his WB issues. He has always been independent and able to take care of himself. He was independent and still drove prior to this fall. He will not be getting on anymore ladders Recent Psychosocial Issues: other health issues but they were managed and he was independent Psychiatric History: History of anxiety takes medications for this and finds it helpful. He may benefit from seeing neuro-psych while here due to drastic change in his abilities and length of time it will take to heal Substance Abuse History: No issues   Patient / Family Perceptions, Expectations & Goals Pt/Family understanding of illness & functional limitations: Pt and wife can explain his fall and injuries as a result. He and she doe talk with the MD's involved and feel they have  a good understanding of his WB issues and plan moving forward. Premorbid pt/family roles/activities: husband, father retiree, church member, friend, etc Anticipated changes in roles/activities/participation: resume Pt/family expectations/goals: Pt states: " I have learned my lesson no more ladders. " Wife states: " We do what is needed for him to heal."   [employer]: None  Premorbid Home Care/DME Agencies: None Transportation available at discharge: self and wife Is the patient able to respond to transportation needs?: Yes In the past 12 months, has lack of transportation kept you from medical appointments or from getting medications?: No In the past 12 months, has lack of transportation kept you from meetings, work, or from getting things needed for daily living?: No Resource referrals recommended: Neuropsychology   Discharge Planning Living Arrangements: Spouse/significant other Support Systems: Spouse/significant other, Children, Friends/neighbors, Psychologist, clinical community Type of Residence: Private residence Insurance Resources: Media planner (specify), Higher education careers adviser) Financial Resources: Social Security,  Family Support Financial Screen Referred: No Living Expenses: Own Money Management: Patient, Spouse Does the patient have any problems obtaining your medications?: No Home Management: both Patient/Family Preliminary Plans: Return home with wife and son is coming from [place] to assist with his care. Wife does work but plans to take [employer] and can alos work from home. Aware being evaluated today and goals being set for stay here. Barrier will be his WB issues. Care Coordinator Barriers to Discharge: Weight bearing restrictions Care Coordinator Anticipated Follow Up Needs: HH/OP   Clinical Impression Pleasant gentleman who has learned his lesson regarding ladders. His wife and son are involved and will be assisting at discharge. Will await team's evaluations and work on discharge. Will get input from team regarding need for neuro-psych   Jovann Luse, Lemar Livings 05/14/2023, 10:23 AM  Addendhum:  Spoke with wife-Sandy via telephone to let her know I will be following again. Her plan is to be with him one month after discharge. She feels he will mentally clear once at home and their son had to go back Sat 12/14. Will update once team conference on Wednesday

## 2023-07-07 NOTE — Progress Notes (Signed)
Physical Therapy Session Note  Patient Details  Name: Louis Juarez. MRN: [id number] Date of Birth: [birth year]  Today's Date: 07/07/2023 PT Individual Time: 1448-1530 PT Individual Time Calculation (min): 42 min   Short Term Goals: Week 1:  PT Short Term Goal 1 (Week 1): STG=LTG 2/2 ELOS  Skilled Therapeutic Interventions/Progress Updates:      Pt supine in bed upon arrival. Pt agreeable to therapy, with encouragement. Pt reports 3/10 R ankle pain. Requested MD order R ankle brace.   Pt performed stand pivot transfer with no AD and min A hospital bed to [facility] with use of bed rail.   Pt refusing to walk during session 2/2 ankle pain.   Pt self propelled WC with supervision/mod I to [employer]. Pt performed sit to stand with RW and light min A/CGA. Pt stood and performed 3x5 B standing marching with RW, with verbal and tactile cues for technique and neutral rotation of hip versus external rotation,  and upright posture, and slowing down.   Pt requesting to use bathroom. Pt performed stand pivot transfer WC to [facility] with use of grab bars and light min A. Pt continent of bowel, pericare with total A while pt stood with min A with grab bar, pt donned pants with supervision while standing with unilateral UE support on grab bar and min A, verbal cues provided for technique.   Pt stood with no AD to wash hands with min A with heavy unilateral, intermittent bilateral forearm support on sink, verbal cues provided for upright posture.   Pt performed stand pivot transfer WC to bed with use of bed rail and CGA.   Pt supine in bed with all needs within reach and bed alarm on.     Therapy Documentation Precautions:  Precautions Precautions: Fall Restrictions Weight Bearing Restrictions Per Provider Order: No RUE Weight Bearing Per Provider Order: Weight bearing as tolerated RLE Weight Bearing Per Provider Order: Weight bearing as tolerated LLE Weight Bearing Per Provider Order: Weight  bearing as tolerated Other Position/Activity Restrictions: "Patient okay to advance to BLE weightbearing for transfers as long as he is not having pain.  If patient develops pain when weightbearing, should go back to nonweightbearing" per Ortho note 06/30/23 by Thyra Breed, PA-C.   Therapy/Group: Individual Therapy  [facility] [place], [place], DPT  07/07/2023, 4:03 PM

## 2023-07-07 NOTE — Plan of Care (Addendum)
  Problem: RH Balance Goal: LTG Patient will maintain dynamic standing with ADLs (OT) Description: LTG:  Patient will maintain dynamic standing balance with assist during activities of daily living (OT)  07/07/2023 2126 by Isabella Stalling, OT Flowsheets (Taken 07/07/2023 2126) LTG: Pt will maintain dynamic standing balance during ADLs with: Supervision/Verbal cueing

## 2023-07-07 NOTE — Plan of Care (Signed)
Patient alert/oriented X3. Patient cannot recall the reason why he came to the hospital. Patient was up in wheelchair the majority of the shift. Patient VSS, no complaints at this time, bed alarm remains on and bed remains in lowest position.   Problem: Education: Goal: Knowledge of risk factors and measures for prevention of condition will improve Outcome: Progressing   Problem: Coping: Goal: Psychosocial and spiritual needs will be supported Outcome: Progressing   Problem: Respiratory: Goal: Will maintain a patent airway Outcome: Progressing   Problem: Respiratory: Goal: Complications related to the disease process, condition or treatment will be avoided or minimized Outcome: Progressing   Problem: Consults Goal: RH GENERAL PATIENT EDUCATION Description: See Patient Education module for education specifics. Outcome: Progressing   Problem: RH BOWEL ELIMINATION Goal: RH STG MANAGE BOWEL WITH ASSISTANCE Description: STG Manage Bowel with toileting Assistance. Outcome: Progressing   Problem: RH BOWEL ELIMINATION Goal: RH STG MANAGE BOWEL W/MEDICATION W/ASSISTANCE Description: STG Manage Bowel with Medication with mod I  Assistance. Outcome: Progressing   Problem: RH BLADDER ELIMINATION Goal: RH STG MANAGE BLADDER WITH ASSISTANCE Description: STG Manage Bladder With toileting Assistance Outcome: Progressing   Problem: RH BLADDER ELIMINATION Goal: RH STG MANAGE BLADDER WITH MEDICATION WITH ASSISTANCE Description: STG Manage Bladder With Medication With mod I  Assistance. Outcome: Progressing   Problem: RH SAFETY Goal: RH STG ADHERE TO SAFETY PRECAUTIONS W/ASSISTANCE/DEVICE Description: STG Adhere to Safety Precautions With cues Assistance/Device. Outcome: Progressing   Problem: RH KNOWLEDGE DEFICIT GENERAL Goal: RH STG INCREASE KNOWLEDGE OF SELF CARE AFTER HOSPITALIZATION Description: Patient and spouse will be able to manage care using educational resources for  medications and diet modifications, and skin care independently Outcome: Progressing

## 2023-07-07 NOTE — Progress Notes (Signed)
Physical Therapy Session Note  Patient Details  Name: Louis Juarez. MRN: [id number] Date of Birth: Oct 31, 1940  Today's Date: 07/07/2023 PT Individual Time: 1345-1410 PT Individual Time Calculation (min): 25 min  Today's Date: 07/07/2023 PT Missed Time: 20 Minutes Missed Time Reason: Patient unwilling to participate  Short Term Goals: Week 1:  PT Short Term Goal 1 (Week 1): STG=LTG 2/2 ELOS  Skilled Therapeutic Interventions/Progress Updates:   Received pt semi-reclined in bed, pt denying any pain but refusing OOB mobility due to not wanting to "aggravate" R ankle. Pt reports "already getting into WC", "already going to gym", "already using bathroom", and "already working on standing" today and ultimately refusing all suggestions for OOB mobility although did recognize importance of movement, telling therapist I know "if I don't use it, I lose it" - did not further press pt to avoid agitating him. Pt agreeable to therapist providing HEP and educated on frequency/duration/technique for the following exercises: - Supine Bridge  - 1 x daily - 7 x weekly - 2 sets - 10 reps - Clamshell  - 1 x daily - 7 x weekly - 2 sets - 10 reps - Supine Gluteal Sets  - 1 x daily - 7 x weekly - 3 sets - 10 reps - 3 hold - Supine Hip Abduction  - 1 x daily - 7 x weekly - 2 sets - 10 reps - Supine Hip Adduction Isometric with Ball  - 1 x daily - 7 x weekly - 3 sets - 10 reps - 3 hold - Supine Active Straight Leg Raise  - 1 x daily - 7 x weekly - 2 sets - 10 reps - Seated Long Arc Quad  - 1 x daily - 7 x weekly - 2 sets - 10 reps - Seated March  - 1 x daily - 7 x weekly - 2 sets - 10 reps - Seated Hip Adduction Isometrics with Ball  - 1 x daily - 7 x weekly - 2 sets - 10 reps - Seated Hip Abduction  - 1 x daily - 7 x weekly - 2 sets - 10 reps Concluded session with pt semi-reclined in bed, needs within reach, and bed alarm on. 20 minutes missed of skilled physical therapy due to refusal to participate.    Therapy Documentation Precautions:  Precautions Precautions: Fall Restrictions Weight Bearing Restrictions Per Provider Order: Yes RUE Weight Bearing Per Provider Order: Weight bearing as tolerated RLE Weight Bearing Per Provider Order: Weight bearing as tolerated LLE Weight Bearing Per Provider Order: Weight bearing as tolerated Other Position/Activity Restrictions: "Patient okay to advance to BLE weightbearing for transfers as long as he is not having pain.  If patient develops pain when weightbearing, should go back to nonweightbearing" per Ortho note 06/30/23 by Thyra Breed, PA-C.   Therapy/Group: Individual Therapy Marlana Salvage Zaunegger Blima Rich PT, DPT 07/07/2023, 10:58 AM

## 2023-07-07 NOTE — Progress Notes (Signed)
PROGRESS NOTE   Subjective/Complaints:  No new concerns elicited this morning.  Patient reports he rested well.  He continues to be confused regarding his reason for being here.  ROS: as per HPI. Denies fever, chills, CP, SOB, abd pain, N/V/D/C, or any other complaints at this time.    Objective:   DG Ankle Complete Right Result Date: 07/06/2023 CLINICAL DATA:  Ankle pain. EXAM: RIGHT ANKLE - COMPLETE 3+ VIEW COMPARISON:  Ankle radiograph 10/11/2013 FINDINGS: No fracture. Normal alignment, no dislocation. The ankle mortise is preserved. Mild tibial talar spurring. Corticated density adjacent to the lateral malleolus is chronic and may represent sequela of remote injury. There is irregularity of the calcaneus primarily involving the plantar surface with undulation. There is also irregularity of the subtalar joint that has a chronic appearance. No ankle joint effusion. There are prominent vascular calcifications. IMPRESSION: 1. Irregularity of the calcaneus primarily involving the plantar surface with undulation. This may be sequela of remote injury or neuropathic change. 2. Irregularity of the subtalar joint has a chronic appearance. 3. Mild tibiotalar spurring. 4. Prominent vascular calcifications. Electronically Signed   By: Narda Rutherford M.D.   On: 07/06/2023 17:56   Recent Labs    07/06/23 0938  WBC 12.5*  HGB 14.4  HCT 43.3  PLT 272   Recent Labs    07/06/23 0938  NA 137  K 3.5  CL 101  CO2 27  GLUCOSE 153*  BUN 15  CREATININE 0.73  CALCIUM 9.1        Intake/Output Summary (Last 24 hours) at 07/07/2023 1323 Last data filed at 07/07/2023 1314 Gross per 24 hour  Intake 1060 ml  Output 125 ml  Net 935 ml        Physical Exam: Vital Signs Blood pressure (!) 148/70, pulse (!) 58, temperature 97.7 F (36.5 C), temperature source Oral, resp. rate 18, height 5\' 10"  (1.778 m), weight 75.1 kg, SpO2  96%.  Constitutional:      Appearance: Normal appearance.     Comments: Sitting in his chair HENT: HOH    Head: Normocephalic and atraumatic.     Nose: Nose normal.     Mouth/Throat:     Mouth: Mucous membranes are moist    Pharynx: Oropharynx is clear.  Eyes:     General:        Right eye: No discharge.        Left eye: No discharge.     Extraocular Movements: Extraocular movements intact.  Cardiovascular:     Rate and Rhythm: Normal rate and regular rhythm.     Pulses: Normal pulses.     Heart sounds: Normal heart sounds.  Pulmonary:     Effort: Pulmonary effort is normal. No respiratory distress.     Breath sounds: Normal breath sounds. No wheezing, rhonchi or rales.  Abdominal:     General: Bowel sounds are normal. There is no distension.     Palpations: Abdomen is soft.     Tenderness: There is no abdominal tenderness. Skin:    General: Skin is warm and dry.     Comments: A few bruises on Ue's Psych: pleasant and cooperative today  Musculoskeletal:        General: Normal range of motion.     Cervical back: Neck supple. No rigidity.     Comments: Ue's 5/5 in B/L UE"s  RLE- 4/5 in HF except for R DF 2/5 LLE- 5-/5 throughout-  Pt wasn't clear when got weak in R DF, but admits it's chronic     Neurological:     Mental Status: He is alert.     Comments: Ox2-3, confused, less perseverating than prior days  intact to light touch in all 4 extremities  Psychiatric:     Comments: Frustrated about CIR- and not having exact details   Assessment/Plan: 1. Functional deficits which require 3+ hours per day of interdisciplinary therapy in a comprehensive inpatient rehab setting. Physiatrist is providing close team supervision and 24 hour management of active medical problems listed below. Physiatrist and rehab team continue to assess barriers to discharge/monitor patient progress toward functional and medical goals  Care Tool:  Bathing    Body parts bathed by patient:  Right arm, Left arm, Chest, Abdomen, Front perineal area, Right upper leg, Left upper leg, Right lower leg, Left lower leg, Face   Body parts bathed by helper: Buttocks     Bathing assist Assist Level: Minimal Assistance - Patient > 75%     Upper Body Dressing/Undressing Upper body dressing   What is the patient wearing?: Pull over shirt    Upper body assist Assist Level: Set up assist    Lower Body Dressing/Undressing Lower body dressing      What is the patient wearing?: Pants     Lower body assist Assist for lower body dressing: Minimal Assistance - Patient > 75%     Toileting Toileting    Toileting assist Assist for toileting: Minimal Assistance - Patient > 75%     Transfers Chair/bed transfer  Transfers assist     Chair/bed transfer assist level: Moderate Assistance - Patient 50 - 74%     Locomotion Ambulation   Ambulation assist      Assist level: Minimal Assistance - Patient > 75% Assistive device: Walker-rolling Max distance: 20 feet   Walk 10 feet activity   Assist     Assist level: 2 helpers (+2 for WC follow) Assistive device: Walker-rolling   Walk 50 feet activity   Assist Walk 50 feet with 2 turns activity did not occur: Safety/medical concerns         Walk 150 feet activity   Assist Walk 150 feet activity did not occur: Safety/medical concerns         Walk 10 feet on uneven surface  activity   Assist Walk 10 feet on uneven surfaces activity did not occur: Safety/medical concerns         Wheelchair     Assist Is the patient using a wheelchair?: Yes Type of Wheelchair: Manual    Wheelchair assist level: Supervision/Verbal cueing Max wheelchair distance: 150    Wheelchair 50 feet with 2 turns activity    Assist        Assist Level: Supervision/Verbal cueing   Wheelchair 150 feet activity     Assist      Assist Level: Supervision/Verbal cueing   Blood pressure (!) 148/70, pulse (!) 58,  temperature 97.7 F (36.5 C), temperature source Oral, resp. rate 18, height 5\' 10"  (1.778 m), weight 75.1 kg, SpO2 96%.  Medical Problem List and Plan: 1. Functional deficits secondary to acute encephalopathy             -  patient may  shower -ELOS/Goals: 7-10 days- pt wants more more than 5 days - min A to Supervision             Continue CIR- con't CIR PT, OT and SLP  -Therapy goals downgraded to min assist   2.  Antithrombotics: -DVT/anticoagulation:  Pharmaceutical: Eliquis 2.5 mg BID             -antiplatelet therapy: none   3. Pain Management: Tylenol as needed, lidoderm patch for LBP   4. Mood/Behavior/Sleep: LCSW to evaluate and provide emotional support             -continue sertraline 25 mg daily  -antipsychotic agents: n/a -Insomnia- was on Seroquel last rehab stay- will restart and melatonin. -07/05/23 issues with insomnia, wife states he was taken off seroquel after last hospital stay, Dr. Jena Gauss started Trazodone-- looks like maybe 50mg  dosing-- and ativan 1mg  at night.   -will d/c seroquel since not effective  -increase melatonin to 10mg  since that helped last time  -add trazodone 10mg  at bedtime  -has ativan 1mg  PRN dose, advised he would have to ask for it  -12/17 patient reports he slept well last night   5. Neuropsych/cognition: This patient is not quite capable of making decisions on his  own behalf.   6. Skin/Wound Care: Routine skin care checks   7. Fluids/Electrolytes/Nutrition: Routine Is and Os and follow-up chemistries   8: Hypertension: monitor TID and prn             -continue carvedilol 25 mg twice daily             -continue spironolactone 25 mg daily  -12/17 fair control, continue current regimen for now and monitor     Vitals:   07/04/23 1506 07/04/23 1955 07/05/23 0607 07/05/23 1330  BP: 127/72 125/78 (!) 152/87 135/65   07/05/23 2014 07/06/23 0646 07/06/23 1330 07/06/23 2035  BP: 134/64 (!) 155/87 (!) 141/63 (!) 148/70    9:  Hyperlipidemia: continue pravastatin 80mg  daily and Zetia 10mg  daily   10: GERD: continue protonix 20mg  daily   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 ??: continue Flomax 0.4 mg with supper  -12/16-7  has been continent of bladder continue to monitor   12: HFrEF/chronic systolic CHF/nonischemic cardiomyopathy             -continue carvedilol 25 mg twice daily             -continue spironolactone 25 mg daily             -lasix held for now-- not overloaded appearing, monitor for need  -Monitor daily weights, no signs of fluid overload noted  Filed Weights   07/04/23 1506 07/06/23 0525  Weight: 75.4 kg 75.1 kg      13: Pelvic ring fracture status post percutaneous fixation prior to presentation             -Follow-up with Dr. Jena Gauss             -is now WBAT on B/L LE's if pain free- is walking 14: COVID-positive pneumonia: Continue dexamethasone 6 mg daily through 12/17 - is off airborne precautions.  15. Limited DNR 16.  Leukocytosis.  Likely due to dexamethasone.  Monitor for signs of infection     LOS: 3 days A FACE TO FACE EVALUATION WAS PERFORMED  Fanny Dance 07/07/2023, 1:23 PM

## 2023-07-07 NOTE — Progress Notes (Signed)
Occupational Therapy Session Note  Patient Details  Name: Louis Juarez. MRN: [id number] Date of Birth: Oct 28, 1940  {CHL IP REHAB OT TIME CALCULATIONS:304400400}  {CHL IP REHAB OT TIME CALCULATIONS:304400400}  Short Term Goals: Week 1:  OT Short Term Goal 1 (Week 1): STG=LTGS (due to ELOS)  Skilled Therapeutic Interventions/Progress Updates:   Session 1: Pt greeted *** for skilled OT session with focus on ***.   Pain: Pt reported ***/10 pain, stating "***" in reference to ***. OT offering intermediate rest breaks and positioning suggestions throughout session to address pain/fatigue and maximize participation/safety in session.   Functional Transfers:  Self Care Tasks:  Therapeutic Activities:  Therapeutic Exercise:   Education:  Pt remained *** with 4Ps assessed and immediate needs met. Pt continues to be appropriate for skilled OT intervention to promote further functional independence in ADLs/IADLs.   Session 2: Pt greeted *** for skilled OT session with focus on ***.   Pain: Pt reported ***/10 pain, stating "***" in reference to ***. OT offering intermediate rest breaks and positioning suggestions throughout session to address pain/fatigue and maximize participation/safety in session.   Functional Transfers:  Self Care Tasks:  Therapeutic Activities:  Therapeutic Exercise:   Education:  Pt remained *** with 4Ps assessed and immediate needs met. Pt continues to be appropriate for skilled OT intervention to promote further functional independence in ADLs/IADLs.    Therapy Documentation Precautions:  Precautions Precautions: Fall Restrictions Weight Bearing Restrictions Per Provider Order: No RUE Weight Bearing Per Provider Order: Weight bearing as tolerated RLE Weight Bearing Per Provider Order: Weight bearing as tolerated LLE Weight Bearing Per Provider Order: Weight bearing as tolerated Other Position/Activity Restrictions: "Patient okay to advance  to BLE weightbearing for transfers as long as he is not having pain.  If patient develops pain when weightbearing, should go back to nonweightbearing" per Ortho note 06/30/23 by Thyra Breed, PA-C.   Therapy/Group: Individual Therapy  [name], OTR/L, MSOT  07/07/2023, 9:17 PM

## 2023-07-07 NOTE — Plan of Care (Signed)
  Problem: RH Functional Use of Upper Extremity Goal: LTG Patient will use RT/LT upper extremity as a (OT) Description: LTG: Patient will use right/left upper extremity as a stabilizer/gross assist/diminished/nondominant/dominant level with assist, with/without cues during functional activity (OT) Outcome: Not Applicable   Problem: RH Balance Goal: LTG Patient will maintain dynamic standing with ADLs (OT) Description: LTG:  Patient will maintain dynamic standing balance with assist during activities of daily living (OT)  Flowsheets (Taken 07/07/2023 2123) LTG: Pt will maintain dynamic standing balance during ADLs with: Contact Guard/Touching assist

## 2023-07-07 NOTE — IPOC Note (Signed)
Overall Plan of Care [facility]) Patient Details Name: Louis Juarez. MRN: [id number] DOB: 1941/03/19  Admitting Diagnosis: Acute encephalopathy  Hospital Problems: Principal Problem:   Acute encephalopathy Active Problems:   Debility     Functional Problem List: Nursing Bladder, Bowel, Safety, Endurance, Medication Management  PT Balance, Behavior, Edema, Endurance, Motor, Pain, Safety, Sensory  OT Balance, Cognition, Endurance, Motor, Pain, Perception, Safety  SLP    TR         Basic ADL's: OT Grooming, Bathing, Dressing, Toileting     Advanced  ADL's: OT       Transfers: PT Bed Mobility, Bed to Chair, Car, Lobbyist, Technical brewer: PT Ambulation, Psychologist, prison and probation services     Additional Impairments: OT Fuctional Use of Upper Extremity  SLP        TR      Anticipated Outcomes Item Anticipated Outcome  Self Feeding    Swallowing      Basic self-care  SBA  Toileting  SBA   Bathroom Transfers CGA  Bowel/Bladder  manage bowel and bladder w mod I assist  Transfers     Locomotion     Communication     Cognition     Pain  n/a  Safety/Judgment  manage w cues   Therapy Plan: PT Intensity: Minimum of 1-2 x/day ,45 to 90 minutes PT Frequency: 5 out of 7 days PT Duration Estimated Length of Stay: 7-10 days OT Intensity: Minimum of 1-2 x/day, 45 to 90 minutes OT Frequency: 5 out of 7 days OT Duration/Estimated Length of Stay: 7-10 days     Team Interventions: Nursing Interventions Bladder Management, Bowel Management, Medication Management, Disease Management/Prevention, Patient/Family Education, Discharge Planning  PT interventions Community reintegration, DME/adaptive equipment instruction, Neuromuscular re-education, Psychosocial support, UE/LE Strength taining/ROM, Museum/gallery curator, Wheelchair propulsion/positioning, Warden/ranger, Discharge planning, Functional electrical stimulation, Pain management, Skin  care/wound management, Therapeutic Activities, UE/LE Coordination activities, Cognitive remediation/compensation, Functional mobility training, Patient/family education, Therapeutic Exercise, Splinting/orthotics  OT Interventions Warden/ranger, Cognitive remediation/compensation, Community reintegration, Discharge planning, Disease mangement/prevention, DME/adaptive equipment instruction, Functional electrical stimulation, Functional mobility training, Neuromuscular re-education, Pain management, Patient/family education, Psychosocial support, Self Care/advanced ADL retraining, Skin care/wound managment, Splinting/orthotics, Therapeutic Activities, Therapeutic Exercise, UE/LE Strength taining/ROM, UE/LE Coordination activities, Visual/perceptual remediation/compensation, Wheelchair propulsion/positioning  SLP Interventions    TR Interventions    SW/CM Interventions Discharge Planning, Psychosocial Support, Patient/Family Education   Barriers to Discharge MD  Medical stability and Behavior  Nursing Decreased caregiver support, Home environment access/layout Home with spouse B+B main level with 8-12 step entry  PT Wound Care, Behavior pain, self limiting behavior  OT Home environment access/layout    SLP      SW       Team Discharge Planning: Destination: PT-Home ,OT- Home , SLP-Home Projected Follow-up: PT-Home health PT, 24 hour supervision/assistance, OT-  Home health OT, SLP-None Projected Equipment Needs: PT-To be determined, OT- To be determined, SLP-None recommended by SLP Equipment Details: PT-pt has RW, WC, rollator, ramp, BSC, shower chair, OT-Pt owns manual WC, slideboard, BSC, and RW Patient/family involved in discharge planning: PT- Patient, Family member/caregiver,  OT-Patient, SLP-Patient  MD ELOS: 7-10 Medical Rehab Prognosis:  Good Assessment: The patient has been admitted for CIR therapies with the diagnosis of acute encephalopathy . The team will be  addressing functional mobility, strength, stamina, balance, safety, adaptive techniques and equipment, self-care, bowel and bladder mgt, patient and caregiver education. Goals have been set at min A. Anticipated discharge  destination is home.        See Team Conference Notes for weekly updates to the plan of care

## 2023-07-07 NOTE — Progress Notes (Signed)
Occupational Therapy Session Note  Patient Details  Name: Louis Juarez. MRN: [id number] Date of Birth: August 04, 1940  Today's Date: 07/07/2023 OT Individual Time: 9528-4132 OT Individual Time Calculation (min): 38 min  and Today's Date: 07/07/2023 OT Missed Time: 17 Minutes Missed Time Reason: Patient unwilling/refused to participate without medical reason   Short Term Goals: Week 1:  OT Short Term Goal 1 (Week 1): STG=LTGS (due to ELOS)  Skilled Therapeutic Interventions/Progress Updates:  Pt greeted resting in bed for skilled OT session with focus on BADL retraining.   Pain: Pt with un-rated pain in R-ankle. OT offering intermediate rest breaks and positioning suggestions throughout session to address pain/fatigue and maximize participation/safety in session.   Functional Transfers: Pt performs bed mobility with supervision, HOB slightly elevated. Static sitting balance at EOB with distant supervision. Stand-pivot from EOB>WC with Min A + RW, pt demo increased dragging of RLE, not receptive to verbal cuing, but no LOB.   Self Care Tasks: Pt declines all self-care tasks aside donning t-shirt and footwear, stating "I brushed my teeth this morning. . . I took a shower yesterday evening." Upon further questioning patient states "I used my walker, got to the sink then used the door knob to get in. . . That bench in there is convenient." Therapist choosing not to challenge confused thoughts for rapport building, no record of this even in patient chart. Pt requires A to don R sock/shoe due to increased pain and limited ROM at ankle. Setup/supervision-Min A (tennis shoe) for L foot and donning t-shirt.   Pt re-oriented to therapy schedule, pt appreciative of information, but deferring further intervention due to ". . . Don't want to hurt myself," pt missing ~15 mins of skilled intervention.   Pt remained siting in [facility] with 4Ps assessed and immediate needs met. Pt continues to be appropriate for  skilled OT intervention to promote further functional independence in ADLs/IADLs.   Therapy Documentation Precautions:  Precautions Precautions: Fall Restrictions Weight Bearing Restrictions Per Provider Order: Yes RUE Weight Bearing Per Provider Order: Weight bearing as tolerated RLE Weight Bearing Per Provider Order: Weight bearing as tolerated LLE Weight Bearing Per Provider Order: Weight bearing as tolerated Other Position/Activity Restrictions: "Patient okay to advance to BLE weightbearing for transfers as long as he is not having pain.  If patient develops pain when weightbearing, should go back to nonweightbearing" per Ortho note 06/30/23 by Thyra Breed, PA-C.   Therapy/Group: Individual Therapy  [name], OTR/L, MSOT  07/07/2023, 5:53 AM

## 2023-07-07 NOTE — Progress Notes (Signed)
Orthopedic Tech Progress Note Patient Details:  Louis Juarez 1941/01/02 [id number]  Ortho Devices Type of Ortho Device: ASO Ortho Device/Splint Location: RLE Ortho Device/Splint Interventions: Ordered   Patient was occupied with PT and OT, therefore we dropped off ankle brace at bedside.  Blase Mess 07/07/2023, 6:32 PM

## 2023-07-07 NOTE — Progress Notes (Signed)
Physical Therapy Session Note  Patient Details  Name: Louis Juarez. MRN: [id number] Date of Birth: February 12, 1941  Today's Date: 07/07/2023 PT Individual Time: 1031-1055 PT Individual Time Calculation (min): 24 min  and Today's Date: 07/07/2023 PT Missed Time: 21 Minutes Missed Time Reason: Patient unwilling to participate;Pain  Short Term Goals: Week 1:  PT Short Term Goal 1 (Week 1): STG=LTG 2/2 ELOS Week 2:     Skilled Therapeutic Interventions/Progress Updates:  Patient supine in bed on entrance to room. Patient alert and agreeable to PT session.   Patient with no pain complaint at start of session.  Therapeutic Activity: Bed Mobility: Pt performed supine <> sit with ***. VC/ tc required for ***.  Patient supine in bed at end of session with brakes locked, bed alarm set, and all needs within reach.   Therapy Documentation Precautions:  Precautions Precautions: Fall Restrictions Weight Bearing Restrictions Per Provider Order: No RUE Weight Bearing Per Provider Order: Weight bearing as tolerated RLE Weight Bearing Per Provider Order: Weight bearing as tolerated LLE Weight Bearing Per Provider Order: Weight bearing as tolerated Other Position/Activity Restrictions: "Patient okay to advance to BLE weightbearing for transfers as long as he is not having pain.  If patient develops pain when weightbearing, should go back to nonweightbearing" per Ortho note 06/30/23 by Thyra Breed, PA-C. General: PT Amount of Missed Time (min): 21 Minutes PT Missed Treatment Reason: Patient unwilling to participate;Pain Vital Signs:   Pain:     Therapy/Group: Individual Therapy  Loel Dubonnet PT, DPT, CSRS 07/07/2023, 5:37 PM

## 2023-07-08 DIAGNOSIS — M25571 Pain in right ankle and joints of right foot: Secondary | ICD-10-CM

## 2023-07-08 MED ORDER — DICLOFENAC SODIUM 1 % EX GEL
4.0000 g | Freq: Four times a day (QID) | CUTANEOUS | Status: DC
Start: 2023-07-08 — End: 2023-07-13
  Administered 2023-07-08 – 2023-07-12 (×10): 4 g via TOPICAL
  Filled 2023-07-08: qty 100

## 2023-07-08 NOTE — Progress Notes (Signed)
Patient ID: Louis Juarez., male   DOB: May 22, 1941, 82 y.o.   MRN: [id number]  Met with pt and spoke with wife via telephone to give both the team conference with goals of supervision-min level and target discharge date of 12/23. Offered wife to come in and go through education with pt prior to discharge. She feels she knows how to move him and was here the other day and saw him in PT session. She is happy about discharge ate and does know he is not participating as much as he should. Will contact [facility] to resume services. He has all equipment. Work toward discharge on Monday.

## 2023-07-08 NOTE — Patient Care Conference (Signed)
Inpatient RehabilitationTeam Conference and Plan of Care Update Date: 07/08/2023   Time: 12:04 PM    Patient Name: Louis Juarez.      Medical Record Number: [id number]  Date of Birth: 1941/05/18 Sex: Male         Room/Bed: 4W10C/4W10C-01 Payor Info: Payor: BLUE CROSS BLUE [employer]IELD / Plan: BCBS COMM PPO / Product Type: *No Product type* /    Admit Date/Time:  07/04/2023  2:45 PM  Primary Diagnosis:  Acute encephalopathy  Hospital Problems: Principal Problem:   Acute encephalopathy Active Problems:   Debility    Expected Discharge Date: Expected Discharge Date: 07/13/23  Team Members Present: Physician leading conference: Dr. Fanny Dance Social Worker Present: Dossie Der, LCSW Nurse Present: Chana Bode, RN PT Present: [name], PT OT Present: [name], OT PPS Coordinator present : Fae Pippin, SLP     Current Status/Progress Goal Weekly Team Focus  Bowel/Bladder   continent of b/b but with some rare incontinent episodes. LBM 12/17   maintain continence   offer toileting q2-4 hrs and PRN    Swallow/Nutrition/ Hydration               ADL's   Setup/supervision for UB ADLs; Min A for LB care & toileting; Mod A for footwear management due to limited R-foot ROM. Barriers: Cognitive deficits including decreased awareness/insight into deficits, decreased motivation, and confusion/agitation.   Overall supervision; exception of tub/shower transfer at [facility]   Functional transfers, dynamic standing tolerance, and pain management.    Mobility   bed mobility independent, sit to stand CGA/min A with RW, min A with no AD, stand pivot transfer min A with no AD and use of bed rail, arm rest or grab bars, mod A for RW,  max cues for safety. Gait x32 feet with min-mod A   supervision/min A  D/C 12/23 barriers to meeting goals: pain in R ankle, and anterior R thigh, pt self limiting behavior 2/2 pain, fear of falling, cognitive deficits , pt refusing  therapy. would benefit from 15/7    Communication                Safety/Cognition/ Behavioral Observations               Pain   no c/o pain   maintain pain free   assess pain qshift and PRN    Skin   wound on rt elbow with foam   maintain infection free wound  assess skin qshift and PRN for s/s of breakdown or infection      Discharge Planning:  HOme with wife who is aware of his need for 24/7 care, barriers are pain, cognition and participation. Needs to be 15/7. Just here one month ago   Team Discussion: Patient post COVID with encephalopathy. Limited by poor insight, lack of motivation, confusion and ankle and hip pain when standing. Premorbid bilateral knee flexion and truncal lean with leg externally rotated, gait disorder with fear of falling.   Patient on target to meet rehab goals: Currently needs set up for upper body care and min assist for lower body cane toileting.  Needs min assist for sit - stand with a RW. Needs min assit for stand pivots without an assistive divide but stand pivots with a RW are unsafe.  Able to ambulate up to 40' once up.   *See Care Plan and progress notes for long and short-term goals.   Revisions to Treatment Plan:  15/7 therapy schedule  Teaching Needs: Safety, medications, transfers, toileting, etc.   Current Barriers to Discharge: Decreased caregiver support, Home enviroment access/layout, and Weight bearing restrictions  Possible Resolutions to Barriers: Family education     Medical Summary Current Status: acute encephalopathy, R ankle pain, HTN, HFrEF, Pelvic ring fx  Barriers to Discharge: Behavior/Mood;Medical stability  Barriers to Discharge Comments: acute encephalopathy, R ankle pain, HTN, HFrEF, Pelvic ring fx Possible Resolutions to [employer] Focus: 24/7, voltaren gel, monitor wt, monitor BP   Continued Need for Acute Rehabilitation Level of Care: The patient requires daily medical management by a  physician with specialized training in physical medicine and rehabilitation for the following reasons: Direction of a multidisciplinary physical rehabilitation program to maximize functional independence : Yes Medical management of patient stability for increased activity during participation in an intensive rehabilitation regime.: Yes Analysis of laboratory values and/or radiology reports with any subsequent need for medication adjustment and/or medical intervention. : Yes   I attest that I was present, lead the team conference, and concur with the assessment and plan of the team.   Chana Bode B 07/08/2023, 2:34 PM

## 2023-07-08 NOTE — Progress Notes (Signed)
Physical Therapy Session Note  Patient Details  Name: Louis Juarez. MRN: [id number] Date of Birth: [birth year]  {CHL IP REHAB PT TIME CALCULATION:[id number]}  Short Term Goals: Week 1:  PT Short Term Goal 1 (Week 1): STG=LTG 2/2 ELOS  Skilled Therapeutic Interventions/Progress Updates:      Pt seated in WC upon arrival. Pt hesistantly agreeabe to therapy with encouragement. Christmas sing along appeared to reorient pt, and allowed pt to be more receptive to therapy. Pt reports pain in R hip with gait. Pt reports pain relief in R ankle with brace.   Donned R ankle brace and shoes-pt currently wearing dress shoes, therapist encourgaing pt to wear tennis shoes however pt refusing despite education.   Pt participated in ~4 songs for Christmas Sing Along with staff and peers in dayroom, pt very appreciative of sing along "I was enjoy doing stuff like that-it brings up such good memories".   Gait 1x25,1x30, 1x20, 1x50 feet with RW and heavy min A, verbal cues provided for safety with RW; pt demos the following deficits: L lateral trunk lean, heavy B UE support on RW, B knee flexion, R hip circumduction, R hip ER  Pt stood and performed 1x10 standing marching B with RW, 1x10, standing hip abduction with RW, verbal and tactile cues provided for upright posture and completion of available ROM-slowly.   Pt seated in WC at end of session with seatbelt alarm on and needs within reach.   Therapy Documentation Precautions:  Precautions Precautions: Fall Restrictions Weight Bearing Restrictions Per Provider Order: Yes RUE Weight Bearing Per Provider Order: Weight bearing as tolerated RLE Weight Bearing Per Provider Order: Weight bearing as tolerated LLE Weight Bearing Per Provider Order: Weight bearing as tolerated Other Position/Activity Restrictions: "Patient okay to advance to BLE weightbearing for transfers as long as he is not having pain.  If patient develops pain when weightbearing,  should go back to nonweightbearing" per Ortho note 06/30/23 by Louis Breed, PA-C.  Therapy/Group: Individual Therapy  [facility], [place], DPT  07/08/2023, 7:27 AM

## 2023-07-08 NOTE — Progress Notes (Signed)
Physical Therapy Session Note  Patient Details  Name: Louis Juarez. MRN: [id number] Date of Birth: 02/03/1941  Today's Date: 07/08/2023 PT Individual Time: 1478-2956 PT Individual Time Calculation (min): 39 min   and  Today's Date: 07/08/2023 PT Missed Time: 21 Minutes Missed Time Reason: Patient fatigue;Patient unwilling to participate  Short Term Goals: Week 1:  PT Short Term Goal 1 (Week 1): STG=LTG 2/2 ELOS  Skilled Therapeutic Interventions/Progress Updates:    Pt received sitting in w/c and with distraction, redirection, and encouragement agreeable to limited therapy session primarily at seated level to avoid increased pain in his R ankle. Pt reports his ankle is feeling much better today compared to yesterday and is currently wearing the ASO and reporting it seems to be helping his pain. B UE w/c propulsion ~118ft to dayroom with supervision while encouraging pt to hold his legs up for strengthening. Pt has some labored breathing with this exercise showing impaired activity tolerance. Therapist showed pt the Nustep and the Kinetron because he reports having a stationary bike at home; however, he declines participation in either of these at this time.  Squat pivot w/c<>EOM, no AD, with CGA for steadying. Therapist donned 3lb ankle weights on B LEs and performed seated EOM exercises as follows:  - B LE long arc quads 2x 10reps per LE - B LE hip flexion/seated marching x10 reps  Therapist performing the exercises with pt to promote increased participation.   Donned 1lb weights to his wrists and performed x10reps of overhead arm presses with therapist providing visual demonstration to promote pt participation.  Throughout session, pt reports he values the importance of exercise; however, reports that at his age he has to be conscience not to over do it.   Pt requesting to return to his room and rest at this time. B UE w/c propulsion ~271ft around nurses station to promote  increased distance with supervision. Pt left seated in w/c with needs in reach and seat belt alarm on. Missed 21 minutes of skilled physical therapy.  Therapy Documentation Precautions:  Precautions Precautions: Fall Restrictions Weight Bearing Restrictions Per Provider Order: Yes RUE Weight Bearing Per Provider Order: Weight bearing as tolerated RLE Weight Bearing Per Provider Order: Weight bearing as tolerated LLE Weight Bearing Per Provider Order: Weight bearing as tolerated Other Position/Activity Restrictions: "Patient okay to advance to BLE weightbearing for transfers as long as he is not having pain.  If patient develops pain when weightbearing, should go back to nonweightbearing" per Ortho note 06/30/23 by Thyra Breed, PA-C.   Pain: No complaints of pain during session, wearing R LE ankle ASO, but pt also declining participation in any WBing exercises to avoid onset of pain.   Therapy/Group: Individual Therapy  Ginny Forth , PT, DPT, NCS, CSRS 07/08/2023, 12:35 PM

## 2023-07-08 NOTE — Progress Notes (Signed)
Occupational Therapy Note  Patient Details  Name: Louis Juarez. MRN: [id number] Date of Birth: [birth year]   Pt's plan of care adjusted to 15/7 after speaking with care team and discussed with MD in team conference as pt currently unable to tolerate current therapy schedule with OT, PT, and SLP.     [name], OTR/L, MSOT  07/08/2023, 3:27 PM

## 2023-07-08 NOTE — Plan of Care (Signed)
  Problem: RH Balance Goal: LTG Patient will maintain dynamic standing balance (PT) Description: LTG:  Patient will maintain dynamic standing balance with assistance during mobility activities (PT) Flowsheets (Taken 07/08/2023 0756) LTG: Pt will maintain dynamic standing balance during mobility activities with:: Supervision/Verbal cueing

## 2023-07-08 NOTE — Progress Notes (Signed)
PROGRESS NOTE   Subjective/Complaints:  Working with therapy this AM. Continues to have R ankle pain.   ROS: Denies fever, HA, CP, SOB, abd pain, N/V/D/C, or any other complaints at this time.    Objective:   DG Ankle Complete Right Result Date: 07/06/2023 CLINICAL DATA:  Ankle pain. EXAM: RIGHT ANKLE - COMPLETE 3+ VIEW COMPARISON:  Ankle radiograph 10/11/2013 FINDINGS: No fracture. Normal alignment, no dislocation. The ankle mortise is preserved. Mild tibial talar spurring. Corticated density adjacent to the lateral malleolus is chronic and may represent sequela of remote injury. There is irregularity of the calcaneus primarily involving the plantar surface with undulation. There is also irregularity of the subtalar joint that has a chronic appearance. No ankle joint effusion. There are prominent vascular calcifications. IMPRESSION: 1. Irregularity of the calcaneus primarily involving the plantar surface with undulation. This may be sequela of remote injury or neuropathic change. 2. Irregularity of the subtalar joint has a chronic appearance. 3. Mild tibiotalar spurring. 4. Prominent vascular calcifications. Electronically Signed   By: Narda Rutherford M.D.   On: 07/06/2023 17:56   Recent Labs    07/06/23 0938  WBC 12.5*  HGB 14.4  HCT 43.3  PLT 272   Recent Labs    07/06/23 0938  NA 137  K 3.5  CL 101  CO2 27  GLUCOSE 153*  BUN 15  CREATININE 0.73  CALCIUM 9.1        Intake/Output Summary (Last 24 hours) at 07/08/2023 1323 Last data filed at 07/08/2023 1236 Gross per 24 hour  Intake 580 ml  Output 350 ml  Net 230 ml        Physical Exam: Vital Signs Blood pressure (!) 147/85, pulse 61, temperature 98.1 F (36.7 C), resp. rate 16, height 5\' 10"  (1.778 m), weight 75.1 kg, SpO2 92%.  Constitutional:      Appearance: Normal appearance.     Comments: Sitting in his chair HENT: HOH    Head: Normocephalic  and atraumatic.     Nose: Nose normal.     Mouth/Throat:     Mouth: Mucous membranes are moist    Pharynx: Oropharynx is clear.  Eyes:     General:        Right eye: No discharge.        Left eye: No discharge.     Extraocular Movements: Extraocular movements intact.  Cardiovascular:     Rate and Rhythm: Normal rate and regular rhythm.     Pulses: Normal pulses.     Heart sounds: Normal heart sounds.  Pulmonary:     Effort: Pulmonary effort is normal. No respiratory distress.     Breath sounds: Normal breath sounds. No wheezing, rhonchi or rales.  Abdominal:     General: Bowel sounds are normal. There is no distension.     Palpations: Abdomen is soft.     Tenderness: There is no abdominal tenderness. Skin:    General: Skin is warm and dry.     Comments: A few bruises on Ue's Psych: pleasant and cooperative today   Musculoskeletal:        General: Normal range of motion.     Cervical back:  Neck supple. No rigidity.     Comments: Ue's 5/5 in B/L UE"s  RLE- 4/5 in HF except for R DF 2/5 LLE- 5-/5 throughout-  Pt wasn't clear when got weak in R DF, but admits it's chronic  Minimal R ankle tenderness to palpation   Neurological:     Mental Status: He is alert.     Comments: Ox2-3, confused, less perseverating than prior days  intact to light touch in all 4 extremities  Psychiatric:     Comments: Frustrated about CIR- and not having exact details   Assessment/Plan: 1. Functional deficits which require 3+ hours per day of interdisciplinary therapy in a comprehensive inpatient rehab setting. Physiatrist is providing close team supervision and 24 hour management of active medical problems listed below. Physiatrist and rehab team continue to assess barriers to discharge/monitor patient progress toward functional and medical goals  Care Tool:  Bathing    Body parts bathed by patient: Right arm, Left arm, Chest, Abdomen, Front perineal area, Right upper leg, Left upper leg,  Right lower leg, Left lower leg, Face   Body parts bathed by helper: Buttocks     Bathing assist Assist Level: Minimal Assistance - Patient > 75%     Upper Body Dressing/Undressing Upper body dressing   What is the patient wearing?: Pull over shirt    Upper body assist Assist Level: Set up assist    Lower Body Dressing/Undressing Lower body dressing      What is the patient wearing?: Pants     Lower body assist Assist for lower body dressing: Minimal Assistance - Patient > 75%     Toileting Toileting    Toileting assist Assist for toileting: Minimal Assistance - Patient > 75%     Transfers Chair/bed transfer  Transfers assist     Chair/bed transfer assist level: Moderate Assistance - Patient 50 - 74% (mod A with RW, min A with no AD and use of bed rail, grab bar)     Locomotion Ambulation   Ambulation assist      Assist level: Moderate Assistance - Patient 50 - 74% (+2 for WC follow) Assistive device: Walker-rolling Max distance: 32 feet   Walk 10 feet activity   Assist     Assist level: 2 helpers (+2 for WC follow) Assistive device: Walker-rolling   Walk 50 feet activity   Assist Walk 50 feet with 2 turns activity did not occur: Safety/medical concerns         Walk 150 feet activity   Assist Walk 150 feet activity did not occur: Safety/medical concerns         Walk 10 feet on uneven surface  activity   Assist Walk 10 feet on uneven surfaces activity did not occur: Safety/medical concerns         Wheelchair     Assist Is the patient using a wheelchair?: Yes Type of Wheelchair: Manual    Wheelchair assist level: Supervision/Verbal cueing Max wheelchair distance: 150    Wheelchair 50 feet with 2 turns activity    Assist        Assist Level: Supervision/Verbal cueing   Wheelchair 150 feet activity     Assist      Assist Level: Supervision/Verbal cueing   Blood pressure (!) 147/85, pulse 61,  temperature 98.1 F (36.7 C), resp. rate 16, height 5\' 10"  (1.778 m), weight 75.1 kg, SpO2 92%.  Medical Problem List and Plan: 1. Functional deficits secondary to acute encephalopathy             -  patient may  shower -ELOS/Goals: 7-10 days- pt wants more more than 5 days - min A to Supervision             Continue CIR- con't CIR PT, OT and SLP  -Therapy goals downgraded to min assist  -Team conference today please see physician documentation under team conference tab, met with team  to discuss problems,progress, and goals. Formulized individual treatment plan based on medical history, underlying problem and comorbidities.   -24/7 schedule    2.  Antithrombotics: -DVT/anticoagulation:  Pharmaceutical: Eliquis 2.5 mg BID             -antiplatelet therapy: none   3. Pain Management: Tylenol as needed, lidoderm patch for LBP   4. Mood/Behavior/Sleep: LCSW to evaluate and provide emotional support             -continue sertraline 25 mg daily  -antipsychotic agents: n/a -Insomnia- was on Seroquel last rehab stay- will restart and melatonin. -07/05/23 issues with insomnia, wife states he was taken off seroquel after last hospital stay, Dr. Jena Gauss started Trazodone-- looks like maybe 50mg  dosing-- and ativan 1mg  at night.   -will d/c seroquel since not effective  -increase melatonin to 10mg  since that helped last time  -add trazodone 10mg  at bedtime  -has ativan 1mg  PRN dose, advised he would have to ask for it  -12/18 reports sleeping ok   5. Neuropsych/cognition: This patient is not quite capable of making decisions on his  own behalf.   6. Skin/Wound Care: Routine skin care checks   7. Fluids/Electrolytes/Nutrition: Routine Is and Os and follow-up chemistries   8: Hypertension: monitor TID and prn             -continue carvedilol 25 mg twice daily             -continue spironolactone 25 mg daily  -12/18 Fair control, continue current regimen for now      Vitals:   07/04/23  1506 07/04/23 1955 07/05/23 0607 07/05/23 1330  BP: 127/72 125/78 (!) 152/87 135/65   07/05/23 2014 07/06/23 0646 07/06/23 1330 07/06/23 2035  BP: 134/64 (!) 155/87 (!) 141/63 (!) 148/70   07/07/23 1322 07/07/23 2025 07/08/23 0607  BP: (!) 141/86 135/74 (!) 147/85    9: Hyperlipidemia: continue pravastatin 80mg  daily and Zetia 10mg  daily   10: GERD: continue protonix 20mg  daily   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 ??: continue Flomax 0.4 mg with supper  -12/16-18  has been continent of bladder continue to monitor   12: HFrEF/chronic systolic CHF/nonischemic cardiomyopathy             -continue carvedilol 25 mg twice daily             -continue spironolactone 25 mg daily             -lasix held for now-- not overloaded appearing, monitor for need  -Monitor daily weights, no signs of fluid overload noted  Filed Weights   07/04/23 1506 07/06/23 0525  Weight: 75.4 kg 75.1 kg      13: Pelvic ring fracture status post percutaneous fixation prior to presentation             -Follow-up with Dr. Jena Gauss             -is now WBAT on B/L LE's if pain free- is walking 14: COVID-positive pneumonia: Continue dexamethasone 6 mg daily through 12/17 - is off airborne precautions.  15. Limited DNR 16.  Leukocytosis.  Likely due to  dexamethasone.  Monitor for signs of infection 17. R ankle pain. Xray with degenerative changes  -Continue brace, start Voltaren    LOS: 4 days A FACE TO FACE EVALUATION WAS PERFORMED  Fanny Dance 07/08/2023, 1:23 PM

## 2023-07-09 DIAGNOSIS — R0989 Other specified symptoms and signs involving the circulatory and respiratory systems: Secondary | ICD-10-CM

## 2023-07-09 LAB — CBC
HCT: 43.6 % (ref 39.0–52.0)
Hemoglobin: 14.4 g/dL (ref 13.0–17.0)
MCH: 30.6 pg (ref 26.0–34.0)
MCHC: 33 g/dL (ref 30.0–36.0)
MCV: 92.8 fL (ref 80.0–100.0)
Platelets: 252 10*3/uL (ref 150–400)
RBC: 4.7 MIL/uL (ref 4.22–5.81)
RDW: 14 % (ref 11.5–15.5)
WBC: 11.8 10*3/uL — ABNORMAL HIGH (ref 4.0–10.5)
nRBC: 0 % (ref 0.0–0.2)

## 2023-07-09 LAB — BASIC METABOLIC PANEL
Anion gap: 8 (ref 5–15)
BUN: 12 mg/dL (ref 8–23)
CO2: 27 mmol/L (ref 22–32)
Calcium: 8.9 mg/dL (ref 8.9–10.3)
Chloride: 100 mmol/L (ref 98–111)
Creatinine, Ser: 0.68 mg/dL (ref 0.61–1.24)
GFR, Estimated: >60 mL/min (ref >60)
Glucose, Bld: 169 mg/dL — ABNORMAL HIGH (ref 70–99)
Potassium: 3.8 mmol/L (ref 3.5–5.1)
Sodium: 135 mmol/L (ref 135–145)

## 2023-07-09 MED ORDER — GUAIFENESIN ER 600 MG PO TB12
600.0000 mg | ORAL_TABLET | Freq: Two times a day (BID) | ORAL | Status: AC
  Administered 2023-07-09 – 2023-07-12 (×8): 600 mg via ORAL
  Filled 2023-07-09 (×8): qty 1

## 2023-07-09 MED ORDER — GUAIFENESIN ER 600 MG PO TB12: 600.0000 mg | ORAL_TABLET | Freq: Two times a day (BID) | ORAL | Status: DC

## 2023-07-09 NOTE — Progress Notes (Signed)
Physical Therapy Session Note  Patient Details  Name: Louis Juarez. MRN: [id number] Date of Birth: 07-23-1940  Today's Date: 07/09/2023 PT Individual Time: 6962-9528, 4132-4401 PT Individual Time Calculation (min): 44 min, 42 min   Short Term Goals: Week 1:  PT Short Term Goal 1 (Week 1): STG=LTG 2/2 ELOS  Skilled Therapeutic Interventions/Progress Updates:     Treatment Session 1  Pt supine in bed upon arrival. Pt reports he was up all night stressed because he "sprained my  ankle last week and am having trouble walking." Pt requesting how to call a cab home. Education provided for POC, length of stay and discharge date of 12/23.   Supine to sit with sup/mod I, donned R ankle brace and tennis shoes with total A and increased time.   Stand pivot transfer bed to [facility] with no AD and CGA for stability.   Pt ambulated 23, 2x46 feet with RW and heavy min A-mod A, max verbal and tactile cues provided for upright posture and safety with RW. Pt required intermittent standing rest breaks each trail for safety and reorientation for safety with RW. Pt demos overall poor carry over of cues   Pt seated in [facility] with all needs within reach and seatbelt alarm on.    Treatment Session 2   Pt supine in bed upon arrival. Pt agreeable to therapy. Pt denies any pain.   Pt requesting to take off brace 2/2 improved ease of donning shoes, and comfort. Donned shoes with mod A and increased time while sitting EOB, verbal cues provided for technique   Pt performed stand pivot transfer bed to WC with CGA.  Pt self propelled WC into bathroom with supervision, min A over incline. Stand pivot transfer with use of grab bar and CGA progressing to supervision. Pt continent of bowel. Pt stood from [facility] and donned/doffed pants and performed pericare with CGA, progressing to supervision. Pt pants wet from urinary incident. Pt doffed shoes, donned non slip socks, and doffed/donned pants with close supervision/CGA for  stability.   Pt ascended ramp in [facility] with supervision, descended with CGA/light min A, verbal cues provided for technique to slow down. Pt requesting to use bathroom again. Pt performed same transfer as described above.   Pt seated in WC at end of session with all needs within reach and seatbelt alarm on.     Therapy Documentation Precautions:  Precautions Precautions: Fall Restrictions Weight Bearing Restrictions Per Provider Order: Yes RUE Weight Bearing Per Provider Order: Weight bearing as tolerated RLE Weight Bearing Per Provider Order: Weight bearing as tolerated LLE Weight Bearing Per Provider Order: Weight bearing as tolerated Other Position/Activity Restrictions: "Patient okay to advance to BLE weightbearing for transfers as long as he is not having pain.  If patient develops pain when weightbearing, should go back to nonweightbearing" per Ortho note 06/30/23 by Thyra Breed, PA-C.  Therapy/Group: Individual Therapy  [facility] [name], [place], DPT  07/09/2023, 7:57 AM

## 2023-07-09 NOTE — Progress Notes (Signed)
Occupational Therapy Session Note  Patient Details  Name: Louis Juarez. MRN: [id number] Date of Birth: 04-Mar-1941  Today's Date: 07/09/2023 OT Individual Time: 3244-0102 OT Individual Time Calculation (min): 47 min    Short Term Goals: Week 1:  OT Short Term Goal 1 (Week 1): STG=LTGS (due to ELOS)  Skilled Therapeutic Interventions/Progress Updates:    Patient received seated in wheelchair.  Patient pleasant, yet declines need to void, declines shower or sponge bath.  Patient states this is the last time he will visit this hospital, stating - 'just need more revenue - so they keep beds full.' When patient asked to share areas where he felt he needed further practice prior to his discharge home, he was able to state - "walking"  He was able to indicate that his feet were "clunky" and at times he steps on his own feet.  Once patient able to indicate an area of need - worked that into the session.  Patient demonstrated limited insight into his deficits, needed orientation/ re-orientation, and gentle coaxing to maintain participation.   Patient walked with RW and ASO on RLE with contact guard- within room.  Patient walks with right foot turned outward, and with tendency to walk quickly. Worked on functional mobility sit to stand, stand to sit with controlled descent, turning, backing up, changing direction.  Patient indicates dull ache in right ankle after brief exercise, and allowed to get back in bed with foot elevated.  Patient declined ice.   Bed alarm engaged and call bell/ personal items in reach.    Therapy Documentation Precautions:  Precautions Precautions: Fall Restrictions Weight Bearing Restrictions Per Provider Order: Yes RUE Weight Bearing Per Provider Order: Weight bearing as tolerated RLE Weight Bearing Per Provider Order: Weight bearing as tolerated LLE Weight Bearing Per Provider Order: Weight bearing as tolerated Other Position/Activity Restrictions: "Patient okay to  advance to BLE weightbearing for transfers as long as he is not having pain.  If patient develops pain when weightbearing, should go back to nonweightbearing" per Ortho note 06/30/23 by Thyra Breed, PA-C. General: General OT Amount of Missed Time: 13 Minutes   Pain:  2/10 right ankle after brief walk    Therapy/Group: Individual Therapy  Collier Salina 07/09/2023, 12:45 PM

## 2023-07-09 NOTE — Plan of Care (Signed)
  Problem: RH Ambulation Goal: LTG Patient will ambulate in home environment (PT) Description: LTG: Patient will ambulate in home environment, # of feet with assistance (PT). Outcome: Not Applicable Flowsheets (Taken 07/09/2023 1417) LTG: Pt will ambulate in home environ  assist needed:: (D/C as not anticipating pt to be functional ambulator at discharge) --   Problem: RH Ambulation Goal: LTG Patient will ambulate in controlled environment (PT) Description: LTG: Patient will ambulate in a controlled environment, # of feet with assistance (PT). Flowsheets (Taken 07/09/2023 1417) LTG: Pt will ambulate in controlled environ  assist needed:: (downgraded distance to 50 feet for safety) -- LTG: Ambulation distance in controlled environment: 50 feet with LRAD

## 2023-07-09 NOTE — Progress Notes (Signed)
PROGRESS NOTE   Subjective/Complaints:  Pt sitting in WC this AM.  Reports chest congestion.   ROS: Denies fever, chills, HA, CP, SOB, abd pain, N/V/D/C, or any other complaints at this time.   + R ankle pain-chronic  Objective:   No results found.  Recent Labs    07/06/23 0938  WBC 12.5*  HGB 14.4  HCT 43.3  PLT 272   Recent Labs    07/06/23 0938  NA 137  K 3.5  CL 101  CO2 27  GLUCOSE 153*  BUN 15  CREATININE 0.73  CALCIUM 9.1        Intake/Output Summary (Last 24 hours) at 07/09/2023 0841 Last data filed at 07/09/2023 0600 Gross per 24 hour  Intake 520 ml  Output 650 ml  Net -130 ml        Physical Exam: Vital Signs Blood pressure (!) 150/69, pulse (!) 54, temperature (!) 97.5 F (36.4 C), temperature source Oral, resp. rate 18, height 5\' 10"  (1.778 m), weight 75.1 kg, SpO2 94%.  Constitutional:      Appearance: Normal appearance.     Comments: Sitting in his chair HENT: HOH    Head: Normocephalic and atraumatic.     Nose: Nose normal.     Mouth/Throat:     Mouth: Mucous membranes are moist    Pharynx: Oropharynx is clear.  Eyes:     General:        Right eye: No discharge.        Left eye: No discharge.     Extraocular Movements: Extraocular movements intact.  Cardiovascular:     Rate and Rhythm: Normal rate and regular rhythm.     Pulses: Normal pulses.     Heart sounds: Normal heart sounds.  Pulmonary:     Effort: Pulmonary effort is normal. No respiratory distress.     Breath sounds: + upper airways breath sounds/ rhonchi b/l anterior and posterior Abdominal:     General: Bowel sounds are normal. There is no distension.     Palpations: Abdomen is soft.     Tenderness: There is no abdominal tenderness. Skin:    General: Skin is warm and dry.     Comments: A few bruises on Ue's Psych: pleasant and cooperative today   Musculoskeletal:        General: Normal range of  motion.     Cervical back: Neck supple. No rigidity.     Comments: Ue's 5/5 in B/L UE"s  RLE- 4/5 in HF except for R DF 2/5 LLE- 5-/5 throughout-  Pt wasn't clear when got weak in R DF, but admits it's chronic  Minimal R ankle tenderness to palpation   Neurological:     Mental Status: He is alert.     Comments: Ox2-3, confused, less perseverating than prior days  intact to light touch in all 4 extremities  Psychiatric:     Comments: Frustrated about CIR- and not having exact details   Assessment/Plan: 1. Functional deficits which require 3+ hours per day of interdisciplinary therapy in a comprehensive inpatient rehab setting. Physiatrist is providing close team supervision and 24 hour management of active medical problems listed below. Physiatrist  and rehab team continue to assess barriers to discharge/monitor patient progress toward functional and medical goals  Care Tool:  Bathing    Body parts bathed by patient: Right arm, Left arm, Chest, Abdomen, Front perineal area, Right upper leg, Left upper leg, Right lower leg, Left lower leg, Face   Body parts bathed by helper: Buttocks     Bathing assist Assist Level: Minimal Assistance - Patient > 75%     Upper Body Dressing/Undressing Upper body dressing   What is the patient wearing?: Pull over shirt    Upper body assist Assist Level: Set up assist    Lower Body Dressing/Undressing Lower body dressing      What is the patient wearing?: Pants     Lower body assist Assist for lower body dressing: Minimal Assistance - Patient > 75%     Toileting Toileting    Toileting assist Assist for toileting: Minimal Assistance - Patient > 75%     Transfers Chair/bed transfer  Transfers assist     Chair/bed transfer assist level: Moderate Assistance - Patient 50 - 74% (mod A with RW, min A with no AD and use of bed rail, grab bar)     Locomotion Ambulation   Ambulation assist      Assist level: Moderate  Assistance - Patient 50 - 74% (+2 for WC follow) Assistive device: Walker-rolling Max distance: 32 feet   Walk 10 feet activity   Assist     Assist level: 2 helpers (+2 for WC follow) Assistive device: Walker-rolling   Walk 50 feet activity   Assist Walk 50 feet with 2 turns activity did not occur: Safety/medical concerns         Walk 150 feet activity   Assist Walk 150 feet activity did not occur: Safety/medical concerns         Walk 10 feet on uneven surface  activity   Assist Walk 10 feet on uneven surfaces activity did not occur: Safety/medical concerns         Wheelchair     Assist Is the patient using a wheelchair?: Yes Type of Wheelchair: Manual    Wheelchair assist level: Supervision/Verbal cueing Max wheelchair distance: 150    Wheelchair 50 feet with 2 turns activity    Assist        Assist Level: Supervision/Verbal cueing   Wheelchair 150 feet activity     Assist      Assist Level: Supervision/Verbal cueing   Blood pressure (!) 150/69, pulse (!) 54, temperature (!) 97.5 F (36.4 C), temperature source Oral, resp. rate 18, height 5\' 10"  (1.778 m), weight 75.1 kg, SpO2 94%.  Medical Problem List and Plan: 1. Functional deficits secondary to acute encephalopathy             -patient may  shower -ELOS/Goals: 7-10 days- pt wants more more than 5 days - min A to Supervision             Continue CIR- con't CIR PT, OT and SLP  -Therapy goals downgraded to min assist  -Expected DC 07/13/23   -24/7 schedule    2.  Antithrombotics: -DVT/anticoagulation:  Pharmaceutical: Eliquis 2.5 mg BID             -antiplatelet therapy: none   3. Pain Management: Tylenol as needed, lidoderm patch for LBP   4. Mood/Behavior/Sleep: LCSW to evaluate and provide emotional support             -continue sertraline 25 mg daily  -  antipsychotic agents: n/a -Insomnia- was on Seroquel last rehab stay- will restart and melatonin. -07/05/23  issues with insomnia, wife states he was taken off seroquel after last hospital stay, Dr. Jena Gauss started Trazodone-- looks like maybe 50mg  dosing-- and ativan 1mg  at night.   -will d/c seroquel since not effective  -increase melatonin to 10mg  since that helped last time  -add trazodone 10mg  at bedtime  -has ativan 1mg  PRN dose, advised he would have to ask for it  -12/18 reports sleeping ok   5. Neuropsych/cognition: This patient is not quite capable of making decisions on his  own behalf.   6. Skin/Wound Care: Routine skin care checks   7. Fluids/Electrolytes/Nutrition: Routine Is and Os and follow-up chemistries   8: Hypertension: monitor TID and prn             -continue carvedilol 25 mg twice daily             -continue spironolactone 25 mg daily  -12/18-9 Fair control, continue current regimen for now      Vitals:   07/05/23 0607 07/05/23 1330 07/05/23 2014 07/06/23 0646  BP: (!) 152/87 135/65 134/64 (!) 155/87   07/06/23 1330 07/06/23 2035 07/07/23 1322 07/07/23 2025  BP: (!) 141/63 (!) 148/70 (!) 141/86 135/74   07/08/23 0607 07/08/23 1346 07/08/23 1916 07/09/23 0620  BP: (!) 147/85 130/78 (!) 134/50 (!) 150/69    9: Hyperlipidemia: continue pravastatin 80mg  daily and Zetia 10mg  daily   10: GERD: continue protonix 20mg  daily   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 ??: continue Flomax 0.4 mg with supper  -12/16-19  has been continent of bladder continue to monitor   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  Jena Gauss             -is now WBAT on B/L LE's if pain free- is walking 14: COVID-positive pneumonia: Continue dexamethasone 6 mg daily through 12/17 - is off airborne precautions.  15. Limited DNR 16.  Leukocytosis.  Likely due to dexamethasone.  Monitor for signs of infection  -Recheck labs 12/19 17. R ankle pain. Xray with degenerative changes  -Continue brace, start Voltaren  18. Chest congestion  -Mucinex 12hr 600mg  BID for 4 days started   LOS: 5 days A FACE TO FACE EVALUATION WAS PERFORMED  Fanny Dance 07/09/2023, 8:41 AM

## 2023-07-10 ENCOUNTER — Encounter (HOSPITAL_COMMUNITY): Payer: Self-pay | Admitting: Physical Medicine & Rehabilitation

## 2023-07-10 NOTE — Progress Notes (Signed)
PROGRESS NOTE   Subjective/Complaints:  Patient sitting in a wheelchair this morning.  Reports he feels " great".  Patient asks again what his prognosis is, why he is here.  He asked about discharge date.  We reviewed this again this morning.  Patient requiring a lot of encouragement to participate in therapy.  We discussed trying again for participation with therapy, patient says he is doing everything therapy requests.    ROS: Denies fever, chills, HA, CP, SOB, abd pain, N/V/D/C, or any other complaints at this time.   + R ankle pain-chronic  Objective:   No results found.  Recent Labs    07/09/23 1211  WBC 11.8*  HGB 14.4  HCT 43.6  PLT 252   Recent Labs    07/09/23 1211  NA 135  K 3.8  CL 100  CO2 27  GLUCOSE 169*  BUN 12  CREATININE 0.68  CALCIUM 8.9        Intake/Output Summary (Last 24 hours) at 07/10/2023 1434 Last data filed at 07/10/2023 1257 Gross per 24 hour  Intake 936 ml  Output 800 ml  Net 136 ml        Physical Exam: Vital Signs Blood pressure 127/61, pulse 60, temperature 97.7 F (36.5 C), resp. rate 17, height 5\' 10"  (1.778 m), weight 75 kg, SpO2 96%.  Constitutional:      Appearance: Normal appearance.     Comments: Sitting in wheelchair HENT: HOH    Head: Normocephalic and atraumatic.     Nose: Nose normal.     Mouth/Throat:     Mouth: Mucous membranes are moist    Pharynx: Oropharynx is clear.  Eyes:     General:        Right eye: No discharge.        Left eye: No discharge.     Extraocular Movements: Extraocular movements intact.  Cardiovascular:     Rate and Rhythm: Normal rate and regular rhythm.     Pulses: Normal pulses.     Heart sounds: Normal heart sounds.  Pulmonary:     Effort: Pulmonary effort is normal. No respiratory distress.     Breath sounds: + upper airways breath sounds/ rhonchi b/l anterior and posterior Abdominal:     General: Bowel sounds are  normal. There is no distension.     Palpations: Abdomen is soft.     Tenderness: There is no abdominal tenderness. Skin:    General: Skin is warm and dry.     Comments: A few bruises on Ue's Psych: pleasant and cooperative today   Musculoskeletal:        General: Normal range of motion.     Cervical back: Neck supple. No rigidity.     Comments: Ue's 5/5 in B/L UE"s  RLE- 4/5 in HF except for R DF 2/5 LLE- 5-/5 throughout-  Pt wasn't clear when got weak in R DF, but admits it's chronic  Minimal R ankle tenderness to palpation   Neurological:     Mental Status: He is alert.     Comments: Alert and oriented to person, Moses:, rehab, month.  Confused.  Memory deficits present.  Perseverates.  Asks same  question every morning-what is more prognosis, what am I doing here Intact to light touch in all 4 extremities  Psychiatric:     Comments: Frustrated about CIR- and not having exact details   Assessment/Plan: 1. Functional deficits which require 3+ hours per day of interdisciplinary therapy in a comprehensive inpatient rehab setting. Physiatrist is providing close team supervision and 24 hour management of active medical problems listed below. Physiatrist and rehab team continue to assess barriers to discharge/monitor patient progress toward functional and medical goals  Care Tool:  Bathing    Body parts bathed by patient: Right arm, Left arm, Chest, Abdomen, Front perineal area, Right upper leg, Left upper leg, Right lower leg, Left lower leg, Face   Body parts bathed by helper: Buttocks     Bathing assist Assist Level: Contact Guard/Touching assist     Upper Body Dressing/Undressing Upper body dressing   What is the patient wearing?: Pull over shirt    Upper body assist Assist Level: Set up assist    Lower Body Dressing/Undressing Lower body dressing      What is the patient wearing?: Underwear/pull up, Pants     Lower body assist Assist for lower body dressing:  Contact Guard/Touching assist     Toileting Toileting    Toileting assist Assist for toileting: Minimal Assistance - Patient > 75%     Transfers Chair/bed transfer  Transfers assist     Chair/bed transfer assist level: Supervision/Verbal cueing     Locomotion Ambulation   Ambulation assist      Assist level: Moderate Assistance - Patient 50 - 74% (+2 for WC follow) Assistive device: Walker-rolling Max distance: 32 feet   Walk 10 feet activity   Assist     Assist level: 2 helpers (+2 for WC follow) Assistive device: Walker-rolling   Walk 50 feet activity   Assist Walk 50 feet with 2 turns activity did not occur: Safety/medical concerns         Walk 150 feet activity   Assist Walk 150 feet activity did not occur: Safety/medical concerns         Walk 10 feet on uneven surface  activity   Assist Walk 10 feet on uneven surfaces activity did not occur: Safety/medical concerns         Wheelchair     Assist Is the patient using a wheelchair?: Yes Type of Wheelchair: Manual    Wheelchair assist level: Supervision/Verbal cueing Max wheelchair distance: 150    Wheelchair 50 feet with 2 turns activity    Assist        Assist Level: Supervision/Verbal cueing   Wheelchair 150 feet activity     Assist      Assist Level: Supervision/Verbal cueing   Blood pressure 127/61, pulse 60, temperature 97.7 F (36.5 C), resp. rate 17, height 5\' 10"  (1.778 m), weight 75 kg, SpO2 96%.  Medical Problem List and Plan: 1. Functional deficits secondary to acute encephalopathy             -patient may  shower -ELOS/Goals: 7-10 days- pt wants more more than 5 days - min A to Supervision             Continue CIR- con't CIR PT, OT and SLP  -Therapy goals downgraded to min assist  -Expected DC 07/13/23   -24/7 schedule  -Will continue to encourage participation with therapy    2.  Antithrombotics: -DVT/anticoagulation:  Pharmaceutical:  Eliquis 2.5 mg BID             -  antiplatelet therapy: none   3. Pain Management: Tylenol as needed, lidoderm patch for LBP   4. Mood/Behavior/Sleep: LCSW to evaluate and provide emotional support             -continue sertraline 25 mg daily  -antipsychotic agents: n/a -Insomnia- was on Seroquel last rehab stay- will restart and melatonin. -07/05/23 issues with insomnia, wife states he was taken off seroquel after last hospital stay, Dr. Jena Gauss started Trazodone-- looks like maybe 50mg  dosing-- and ativan 1mg  at night.   -will d/c seroquel since not effective  -increase melatonin to 10mg  since that helped last time  -add trazodone 10mg  at bedtime  -has ativan 1mg  PRN dose, advised he would have to ask for it  -12/20 patient reports he is not getting his sleep medications.  Reviewed with him that he got melatonin and trazodone last night.   5. Neuropsych/cognition: This patient is not quite capable of making decisions on his  own behalf.   6. Skin/Wound Care: Routine skin care checks   7. Fluids/Electrolytes/Nutrition: Routine Is and Os and follow-up chemistries   8: Hypertension: monitor TID and prn             -continue carvedilol 25 mg twice daily             -continue spironolactone 25 mg daily  -12/20 intermittently elevated, continue current regimen      Vitals:   07/06/23 2035 07/07/23 1322 07/07/23 2025 07/08/23 0607  BP: (!) 148/70 (!) 141/86 135/74 (!) 147/85   07/08/23 1346 07/08/23 1916 07/09/23 0620 07/09/23 1320  BP: 130/78 (!) 134/50 (!) 150/69 121/64   07/09/23 1932 07/10/23 0621 07/10/23 0845 07/10/23 1416  BP: (!) 120/57 (!) 144/66 139/70 127/61    9: Hyperlipidemia: continue pravastatin 80mg  daily and Zetia 10mg  daily   10: GERD: continue protonix 20mg  daily   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 ??: continue Flomax 0.4 mg with supper  -12/16-20  has been continent of bladder continue to monitor   12: HFrEF/chronic systolic CHF/nonischemic cardiomyopathy              -continue carvedilol 25 mg twice daily             -continue spironolactone 25 mg daily             -lasix held for now-- not overloaded appearing, monitor for need  -Monitor daily weights, no signs of fluid overload noted  -12/20 weight stable today  Filed Weights   07/04/23 1506 07/06/23 0525 07/10/23 0657  Weight: 75.4 kg 75.1 kg 75 kg      13: Pelvic ring fracture status post percutaneous fixation prior to presentation             -Follow-up with Dr. Jena Gauss             -is now WBAT on B/L LE's if pain free- is walking 14: COVID-positive pneumonia: Continue dexamethasone 6 mg daily through 12/17 - is off airborne precautions.  15. Limited DNR 16.  Leukocytosis.  Likely due to dexamethasone.  Monitor for signs of infection  -Overall improved to 11.8 17. R ankle pain. Xray with degenerative changes  -Continue brace, start Voltaren  18. Chest congestion  -Mucinex 12hr 600mg  BID for 4 days started  -Improving   LOS: 6 days A FACE TO FACE EVALUATION WAS PERFORMED  Fanny Dance 07/10/2023, 2:34 PM

## 2023-07-10 NOTE — Progress Notes (Signed)
Occupational Therapy Session Note  Patient Details  Name: Louis Juarez. MRN: [id number] Date of Birth: May 31, 1941  {CHL IP REHAB OT TIME CALCULATIONS:304400400}  {CHL IP REHAB OT TIME CALCULATIONS:304400400}  Short Term Goals: Week 1:  OT Short Term Goal 1 (Week 1): STG=LTGS (due to ELOS)  Skilled Therapeutic Interventions/Progress Updates:   Session 1: Pt greeted *** for skilled OT session with focus on ***.   Pain: Pt reported ***/10 pain, stating "***" in reference to ***. OT offering intermediate rest breaks and positioning suggestions throughout session to address pain/fatigue and maximize participation/safety in session.   Functional Transfers:  Self Care Tasks:  Therapeutic Activities:  Therapeutic Exercise:   Education:  Pt remained *** with 4Ps assessed and immediate needs met. Pt continues to be appropriate for skilled OT intervention to promote further functional independence in ADLs/IADLs.   Session 2: Pt greeted *** for skilled OT session with focus on ***.   Pain: Pt reported ***/10 pain, stating "***" in reference to ***. OT offering intermediate rest breaks and positioning suggestions throughout session to address pain/fatigue and maximize participation/safety in session.   Functional Transfers:  Self Care Tasks:  Therapeutic Activities:  Therapeutic Exercise:   Education:  Pt remained *** with 4Ps assessed and immediate needs met. Pt continues to be appropriate for skilled OT intervention to promote further functional independence in ADLs/IADLs.    Therapy Documentation Precautions:  Precautions Precautions: Fall Restrictions Weight Bearing Restrictions Per Provider Order: Yes RUE Weight Bearing Per Provider Order: Weight bearing as tolerated RLE Weight Bearing Per Provider Order: Weight bearing as tolerated LLE Weight Bearing Per Provider Order: Weight bearing as tolerated Other Position/Activity Restrictions: "Patient okay to advance  to BLE weightbearing for transfers as long as he is not having pain.  If patient develops pain when weightbearing, should go back to nonweightbearing" per Ortho note 06/30/23 by Thyra Breed, PA-C.   Therapy/Group: Individual Therapy  [name], OTR/L, MSOT  07/10/2023, 8:31 PM

## 2023-07-10 NOTE — Progress Notes (Signed)
Physical Therapy Session Note  Patient Details  Name: Louis Juarez. MRN: [id number] Date of Birth: 05-31-1941  Today's Date: 07/10/2023 PT Individual Time: 0865-7846 PT Individual Time Calculation (min): 39 min   Short Term Goals: Week 1:  PT Short Term Goal 1 (Week 1): STG=LTG 2/2 ELOS  Skilled Therapeutic Interventions/Progress Updates:   Received pt semi-reclined in bed with RN administering medication. Pt required maximal encouragement and distraction to participate in OOB mobility. Session with emphasis on functional mobility/transfers, generalized strengthening and endurance, and dynamic standing balance/coordination. Pt transferred semi-reclined<>sitting R EOB with HOB elevated and mod I and RN applied Lidocane patch and Voltaren to R ankle. Pt transferred bed<>WC stand<>pivot with RW and mod A and doffed dirty shirt and donned clean one with supervision - pt refused shoes and ankle brace this morning.  Pt transported to/from room in [facility] dependently for time management purposes. Pt adamantly refused any ambulation this morning "not wanting to aggravate his R ankle" and stated "I really don't want to do anything". With encouragement pt performed seated BLE strengthening on Kinetron at 20 cm/sec for 1 minute x 4 trials with emphasis on glute/quad strength. Stood with RW and min A and performed RLE toe taps to 3in step 2x10 with emphasis on R hip flexor strength. Returned to room and concluded session with pt sitting in WC, needs within reach, and seatbelt alarm on.   Therapy Documentation Precautions:  Precautions Precautions: Fall Restrictions Weight Bearing Restrictions Per Provider Order: Yes RUE Weight Bearing Per Provider Order: Weight bearing as tolerated RLE Weight Bearing Per Provider Order: Weight bearing as tolerated LLE Weight Bearing Per Provider Order: Weight bearing as tolerated Other Position/Activity Restrictions: "Patient okay to advance to BLE weightbearing for  transfers as long as he is not having pain.  If patient develops pain when weightbearing, should go back to nonweightbearing" per Ortho note 06/30/23 by Thyra Breed, PA-C.  Therapy/Group: Individual Therapy Marlana Salvage Zaunegger Blima Rich PT, DPT 07/10/2023, 7:06 AM

## 2023-07-10 NOTE — Progress Notes (Signed)
Physical Therapy Session Note  Patient Details  Name: Louis Juarez. MRN: [id number] Date of Birth: 1940-10-08  Today's Date: 07/10/2023 PT Individual Time: 1415-1455 PT Individual Time Calculation (min): 40 min   Short Term Goals: Week 1:  PT Short Term Goal 1 (Week 1): STG=LTG 2/2 ELOS  Skilled Therapeutic Interventions/Progress Updates:    Session focused on functional transfers, out of bed/upright tolerance, cognitive remediation and w/c propulsion for general strengthening and endurance. Pt performed bed mobility independently during session for supine <> sit and repositioning. Performed functional transfers with supervision w/c <> bed with stand pivot technique with cues for safe w/c parts management and set up. Performed w/c propulsion on unit x 150' with supervision for general strengthening and endurance. Discussed d/c planning and home set up but pt difficulty with correlating information from session and current level of mobility (declining wanting to work on walking/standing, but states he will be walking in his house, etc and states he does not have a w/c (but per chart was using one prior to admission). Noted decreased carryover and memory throughout session.   Therapy Documentation Precautions:  Precautions Precautions: Fall Restrictions Weight Bearing Restrictions Per Provider Order: Yes RUE Weight Bearing Per Provider Order: Weight bearing as tolerated RLE Weight Bearing Per Provider Order: Weight bearing as tolerated LLE Weight Bearing Per Provider Order: Weight bearing as tolerated  Pain:  No reports of pain - states his ankle is doing better but he requests not putting weight through it ("no standing or walking") for another day so he doesn't aggravate it again.     Therapy/Group: Individual Therapy  Karolee Stamps Darrol Poke, PT, DPT, CBIS  07/10/2023, 3:03 PM

## 2023-07-10 NOTE — Plan of Care (Signed)
  Problem: Education: Goal: Knowledge of risk factors and measures for prevention of condition will improve Outcome: Progressing   Problem: Coping: Goal: Psychosocial and spiritual needs will be supported Outcome: Progressing   Problem: Respiratory: Goal: Will maintain a patent airway Outcome: Progressing Goal: Complications related to the disease process, condition or treatment will be avoided or minimized Outcome: Progressing   Problem: Consults Goal: RH GENERAL PATIENT EDUCATION Description: See Patient Education module for education specifics. Outcome: Progressing   Problem: RH BOWEL ELIMINATION Goal: RH STG MANAGE BOWEL WITH ASSISTANCE Description: STG Manage Bowel with toileting Assistance. Outcome: Progressing Goal: RH STG MANAGE BOWEL W/MEDICATION W/ASSISTANCE Description: STG Manage Bowel with Medication with mod I  Assistance. Outcome: Progressing   Problem: RH BLADDER ELIMINATION Goal: RH STG MANAGE BLADDER WITH ASSISTANCE Description: STG Manage Bladder With toileting Assistance Outcome: Progressing Goal: RH STG MANAGE BLADDER WITH MEDICATION WITH ASSISTANCE Description: STG Manage Bladder With Medication With mod I  Assistance. Outcome: Progressing   Problem: RH SAFETY Goal: RH STG ADHERE TO SAFETY PRECAUTIONS W/ASSISTANCE/DEVICE Description: STG Adhere to Safety Precautions With cues Assistance/Device. Outcome: Progressing   Problem: RH KNOWLEDGE DEFICIT GENERAL Goal: RH STG INCREASE KNOWLEDGE OF SELF CARE AFTER HOSPITALIZATION Description: Patient and spouse will be able to manage care using educational resources for medications and diet modifications, and skin care independently Outcome: Progressing

## 2023-07-10 NOTE — Progress Notes (Signed)
Occupational Therapy Session Note  Patient Details  Name: Louis Juarez. MRN: [id number] Date of Birth: 09-10-1940  Today's Date: 07/10/2023 OT Individual Time: 1101-1145 OT Individual Time Calculation (min): 44 min    Short Term Goals: Week 1:  OT Short Term Goal 1 (Week 1): STG=LTGS (due to ELOS) Week 2:     Skilled Therapeutic Interventions/Progress Updates:    1:1 Pt received in the w/c in the room. Pt agreeable to showering and dressing today. Pt transferred from the w/c to the tub bench in the shower with RW with supervision with A to steer RW. Pt performed bathing at shower level with min A for thoroughness of buttocks. Pt able to perform sit to stands throughout session with supervision. Pt transferred stand pivot from tub bench with supervision to the w/c. Pt able to don shirt with setup and when threaded right LE first pt able to thread underwear and pants. Sit to stand at sink with supervision and able to pull up pants. Socks and shoes donned with total A. (Socks were tighter). Assisted patient with shaving at the sink. Pt left sitting up in the w/c in prep for lunch.   Therapy Documentation Precautions:  Precautions Precautions: Fall Restrictions Weight Bearing Restrictions Per Provider Order: Yes RUE Weight Bearing Per Provider Order: Weight bearing as tolerated RLE Weight Bearing Per Provider Order: Weight bearing as tolerated LLE Weight Bearing Per Provider Order: Weight bearing as tolerated Other Position/Activity Restrictions: "Patient okay to advance to BLE weightbearing for transfers as long as he is not having pain.  If patient develops pain when weightbearing, should go back to nonweightbearing" per Ortho note 06/30/23 by Thyra Breed, PA-C. General:   Vital Signs: Therapy Vitals Temp: 97.7 F (36.5 C) Temp Source: Oral Pulse Rate: 60 BP: 139/70 Patient Position (if appropriate): Sitting Oxygen Therapy SpO2: 96 % O2 Device: Room Air Pain: 2nd Pain  Site Pain Score: 0   Therapy/Group: Individual Therapy  Roney Mans [facility] 07/10/2023, 11:59 AM

## 2023-07-11 NOTE — Progress Notes (Signed)
PROGRESS NOTE   Subjective/Complaints:  Pt doing well, though he states he slept poorly due to interruptions. Denies pain, LBM yesterday, urinating fine. Denies any other complaints or concerns today.     ROS: Denies fever, chills, HA, CP, SOB, abd pain, N/V/D/C, or any other complaints at this time.   + R ankle pain-chronic  Objective:   No results found.  Recent Labs    07/09/23 1211  WBC 11.8*  HGB 14.4  HCT 43.6  PLT 252   Recent Labs    07/09/23 1211  NA 135  K 3.8  CL 100  CO2 27  GLUCOSE 169*  BUN 12  CREATININE 0.68  CALCIUM 8.9        Intake/Output Summary (Last 24 hours) at 07/11/2023 1158 Last data filed at 07/11/2023 0748 Gross per 24 hour  Intake 960 ml  Output --  Net 960 ml        Physical Exam: Vital Signs Blood pressure (!) 134/59, pulse 65, temperature 98.3 F (36.8 C), temperature source Oral, resp. rate 20, height 5\' 10"  (1.778 m), weight 75 kg, SpO2 94%.  Constitutional:      Appearance: Normal appearance.     Comments: laying in bed HENT: HOH    Head: Normocephalic and atraumatic.     Nose: Nose normal.     Mouth/Throat:     Mouth: Mucous membranes are moist    Pharynx: Oropharynx is clear.  Eyes:     General:        Right eye: No discharge.        Left eye: No discharge.     Extraocular Movements: Extraocular movements intact.  Cardiovascular:     Rate and Rhythm: Normal rate and regular rhythm.     Pulses: Normal pulses.     Heart sounds: Normal heart sounds.  Pulmonary:     Effort: Pulmonary effort is normal. No respiratory distress.     Breath sounds: + upper airways breath sounds/ rhonchi b/l anterior and posterior- less today than prior Abdominal:     General: Bowel sounds are normal. There is no distension.     Palpations: Abdomen is soft.     Tenderness: There is no abdominal tenderness. Skin:    General: Skin is warm and dry.     Comments: A few  bruises on Ue's Psych: pleasant and cooperative today  PRIOR EXAMS: Musculoskeletal:        General: Normal range of motion.     Cervical back: Neck supple. No rigidity.     Comments: Ue's 5/5 in B/L UE"s  RLE- 4/5 in HF except for R DF 2/5 LLE- 5-/5 throughout-  Pt wasn't clear when got weak in R DF, but admits it's chronic  Minimal R ankle tenderness to palpation   Neurological:     Mental Status: He is alert.     Comments: Alert and oriented to person, Moses:, rehab, month.  Confused.  Memory deficits present.  Perseverates.  Asks same question every morning-what is more prognosis, what am I doing here Intact to light touch in all 4 extremities  Psychiatric:     Comments: Frustrated about CIR- and not having  exact details   Assessment/Plan: 1. Functional deficits which require 3+ hours per day of interdisciplinary therapy in a comprehensive inpatient rehab setting. Physiatrist is providing close team supervision and 24 hour management of active medical problems listed below. Physiatrist and rehab team continue to assess barriers to discharge/monitor patient progress toward functional and medical goals  Care Tool:  Bathing    Body parts bathed by patient: Right arm, Left arm, Chest, Abdomen, Front perineal area, Right upper leg, Left upper leg, Right lower leg, Left lower leg, Face   Body parts bathed by helper: Buttocks     Bathing assist Assist Level: Contact Guard/Touching assist     Upper Body Dressing/Undressing Upper body dressing   What is the patient wearing?: Pull over shirt    Upper body assist Assist Level: Set up assist    Lower Body Dressing/Undressing Lower body dressing      What is the patient wearing?: Underwear/pull up, Pants     Lower body assist Assist for lower body dressing: Contact Guard/Touching assist     Toileting Toileting    Toileting assist Assist for toileting: Minimal Assistance - Patient > 75%     Transfers Chair/bed  transfer  Transfers assist     Chair/bed transfer assist level: Supervision/Verbal cueing     Locomotion Ambulation   Ambulation assist      Assist level: Moderate Assistance - Patient 50 - 74% (+2 for WC follow) Assistive device: Walker-rolling Max distance: 32 feet   Walk 10 feet activity   Assist     Assist level: 2 helpers (+2 for WC follow) Assistive device: Walker-rolling   Walk 50 feet activity   Assist Walk 50 feet with 2 turns activity did not occur: Safety/medical concerns         Walk 150 feet activity   Assist Walk 150 feet activity did not occur: Safety/medical concerns         Walk 10 feet on uneven surface  activity   Assist Walk 10 feet on uneven surfaces activity did not occur: Safety/medical concerns         Wheelchair     Assist Is the patient using a wheelchair?: Yes Type of Wheelchair: Manual    Wheelchair assist level: Supervision/Verbal cueing Max wheelchair distance: 150    Wheelchair 50 feet with 2 turns activity    Assist        Assist Level: Supervision/Verbal cueing   Wheelchair 150 feet activity     Assist      Assist Level: Supervision/Verbal cueing   Blood pressure (!) 134/59, pulse 65, temperature 98.3 F (36.8 C), temperature source Oral, resp. rate 20, height 5\' 10"  (1.778 m), weight 75 kg, SpO2 94%.  Medical Problem List and Plan: 1. Functional deficits secondary to acute encephalopathy             -patient may  shower -ELOS/Goals: 7-10 days- pt wants more more than 5 days - min A to Supervision             Continue CIR- con't CIR PT, OT and SLP  -Therapy goals downgraded to min assist  -Expected DC 07/13/23   -24/7 schedule  -Will continue to encourage participation with therapy    2.  Antithrombotics: -DVT/anticoagulation:  Pharmaceutical: Eliquis 2.5 mg BID             -antiplatelet therapy: none   3. Pain Management: Tylenol as needed, lidoderm patch for LBP   4.  Mood/Behavior/Sleep: LCSW to evaluate  and provide emotional support             -continue sertraline 25 mg daily  -antipsychotic agents: n/a -Insomnia- was on Seroquel last rehab stay- will restart and melatonin. -07/05/23 issues with insomnia, wife states he was taken off seroquel after last hospital stay, Dr. Jena Gauss started Trazodone-- looks like maybe 50mg  dosing-- and ativan 1mg  at night.   -will d/c seroquel since not effective  -increase melatonin to 10mg  since that helped last time  -add trazodone 10mg  at bedtime  -has ativan 1mg  PRN dose, advised he would have to ask for it  -12/20 patient reports he is not getting his sleep medications.  Reviewed with him that he got melatonin and trazodone last night.   5. Neuropsych/cognition: This patient is not quite capable of making decisions on his  own behalf.   6. Skin/Wound Care: Routine skin care checks   7. Fluids/Electrolytes/Nutrition: Routine Is and Os and follow-up chemistries   8: Hypertension: monitor TID and prn             -continue carvedilol 25 mg twice daily             -continue spironolactone 25 mg daily  -12/20 intermittently elevated, continue current regimen  -07/11/23 overall well controlled, monitor    Vitals:   07/08/23 0607 07/08/23 1346 07/08/23 1916 07/09/23 0620  BP: (!) 147/85 130/78 (!) 134/50 (!) 150/69   07/09/23 1320 07/09/23 1932 07/10/23 0621 07/10/23 0845  BP: 121/64 (!) 120/57 (!) 144/66 139/70   07/10/23 1416 07/10/23 2107 07/11/23 0530 07/11/23 0814  BP: 127/61 128/63 139/69 (!) 134/59    9: Hyperlipidemia: continue pravastatin 80mg  daily and Zetia 10mg  daily   10: GERD: continue protonix 20mg  daily   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 ??: continue Flomax 0.4 mg with supper  -12/16-20  has been continent of bladder continue to monitor   12: HFrEF/chronic systolic CHF/nonischemic cardiomyopathy             -continue carvedilol 25 mg twice daily             -continue spironolactone 25 mg daily              -lasix held for now-- not overloaded appearing, monitor for need  -Monitor daily weights, no signs of fluid overload noted  -12/20 weight stable today  Filed Weights   07/04/23 1506 07/06/23 0525 07/10/23 0657  Weight: 75.4 kg 75.1 kg 75 kg      13: Pelvic ring fracture status post percutaneous fixation prior to presentation             -Follow-up with Dr. Jena Gauss             -is now WBAT on B/L LE's if pain free- is walking 14: COVID-positive pneumonia: Continue dexamethasone 6 mg daily through 12/17 - is off airborne precautions.  15. Limited DNR 16.  Leukocytosis.  Likely due to dexamethasone.  Monitor for signs of infection  -Overall improved to 11.8 17. R ankle pain. Xray with degenerative changes  -Continue brace, start Voltaren  18. Chest congestion  -Mucinex 12hr 600mg  BID for 4 days started  -Improving   LOS: 7 days A FACE TO FACE EVALUATION WAS PERFORMED  [address] Louis Juarez 07/11/2023, 11:58 AM

## 2023-07-11 NOTE — Progress Notes (Signed)
Physical Therapy Session Note  Patient Details  Name: Louis Juarez. MRN: [id number] Date of Birth: [birth year]  Today's Date: 07/11/2023 PT Individual Time: 1419-1430 PT Individual Time Calculation (min): 11 min  Today's Date: 07/11/2023 PT Missed Time: 34 Minutes Missed Time Reason: Patient unwilling to participate  Short Term Goals: Week 1:  PT Short Term Goal 1 (Week 1): STG=LTG 2/2 ELOS  Skilled Therapeutic Interventions/Progress Updates:   Received pt sitting EOB with NT exiting room. Pt immediately refusing therapy, not wanting to "aggravate R ankle" again. Attempted to encourage participation in session and distract pt from pain, however pt fixated on "not right now, not this afternoon" since he "already went to the gym". Transferred into supine with mod I and elevated RLE on pillow for edema management. Concluded session with pt semi-reclined in bed, needs within reach, and bed alarm on. 34 minutes missed of skilled physical therapy due to refusal to participate. Will attempt to make up missed time as pt is willing to participate.   Therapy Documentation Precautions:  Precautions Precautions: Fall Restrictions Weight Bearing Restrictions Per Provider Order: Yes RUE Weight Bearing Per Provider Order: Weight bearing as tolerated RLE Weight Bearing Per Provider Order: Weight bearing as tolerated LLE Weight Bearing Per Provider Order: Weight bearing as tolerated Other Position/Activity Restrictions: "Patient okay to advance to BLE weightbearing for transfers as long as he is not having pain.  If patient develops pain when weightbearing, should go back to nonweightbearing" per Ortho note 06/30/23 by Thyra Breed, PA-C.  Therapy/Group: Individual Therapy Marlana Salvage Zaunegger Blima Rich PT, DPT 07/11/2023, 7:13 AM

## 2023-07-11 NOTE — Plan of Care (Signed)
  Problem: Education: Goal: Knowledge of risk factors and measures for prevention of condition will improve Outcome: Progressing   Problem: Coping: Goal: Psychosocial and spiritual needs will be supported Outcome: Progressing   Problem: Respiratory: Goal: Will maintain a patent airway Outcome: Progressing Goal: Complications related to the disease process, condition or treatment will be avoided or minimized Outcome: Progressing   Problem: Consults Goal: RH GENERAL PATIENT EDUCATION Description: See Patient Education module for education specifics. Outcome: Progressing   Problem: RH BOWEL ELIMINATION Goal: RH STG MANAGE BOWEL WITH ASSISTANCE Description: STG Manage Bowel with toileting Assistance. Outcome: Progressing Goal: RH STG MANAGE BOWEL W/MEDICATION W/ASSISTANCE Description: STG Manage Bowel with Medication with mod I  Assistance. Outcome: Progressing   Problem: RH BLADDER ELIMINATION Goal: RH STG MANAGE BLADDER WITH ASSISTANCE Description: STG Manage Bladder With toileting Assistance Outcome: Progressing Goal: RH STG MANAGE BLADDER WITH MEDICATION WITH ASSISTANCE Description: STG Manage Bladder With Medication With mod I  Assistance. Outcome: Progressing   Problem: RH SAFETY Goal: RH STG ADHERE TO SAFETY PRECAUTIONS W/ASSISTANCE/DEVICE Description: STG Adhere to Safety Precautions With cues Assistance/Device. Outcome: Progressing   Problem: RH KNOWLEDGE DEFICIT GENERAL Goal: RH STG INCREASE KNOWLEDGE OF SELF CARE AFTER HOSPITALIZATION Description: Patient and spouse will be able to manage care using educational resources for medications and diet modifications, and skin care independently Outcome: Progressing

## 2023-07-12 MED ORDER — ACETAMINOPHEN 325 MG PO TABS: 325.0000 mg | ORAL_TABLET | ORAL | Status: AC | PRN

## 2023-07-12 NOTE — Plan of Care (Signed)
  Problem: RH Dressing Goal: LTG Patient will perform lower body dressing w/assist (OT) Description: LTG: Patient will perform lower body dressing with assist, with/without cues in positioning using equipment (OT) Outcome: Completed/Met   Problem: RH Toileting Goal: LTG Patient will perform toileting task (3/3 steps) with assistance level (OT) Description: LTG: Patient will perform toileting task (3/3 steps) with assistance level (OT)  Outcome: Completed/Met   Problem: RH Toilet Transfers Goal: LTG Patient will perform toilet transfers w/assist (OT) Description: LTG: Patient will perform toilet transfers with assist, with/without cues using equipment (OT) Outcome: Completed/Met   Problem: RH Tub/Shower Transfers Goal: LTG Patient will perform tub/shower transfers w/assist (OT) Description: LTG: Patient will perform tub/shower transfers with assist, with/without cues using equipment (OT) Outcome: Completed/Met Flowsheets (Taken 07/12/2023 1225) LTG: Pt will perform tub/shower transfers from: Tub/shower combination   Problem: RH Balance Goal: LTG Patient will maintain dynamic standing with ADLs (OT) Description: LTG:  Patient will maintain dynamic standing balance with assist during activities of daily living (OT)  Outcome: Adequate for Discharge Note: Pt requires fluctuating levels of assistance, SUP-Min A for dynamic standing balance activities, dependent on R-ankle pain and fatigue.    Problem: RH Tub/Shower Transfers Goal: LTG Patient will perform tub/shower transfers w/assist (OT) Description: LTG: Patient will perform tub/shower transfers with assist, with/without cues using equipment (OT) Outcome: Completed/Met Flowsheets (Taken 07/12/2023 1225) LTG: Pt will perform tub/shower transfers from: Tub/shower combination

## 2023-07-12 NOTE — Progress Notes (Addendum)
Occupational Therapy Discharge Summary  Patient Details  Name: Louis Juarez. MRN: [id number] Date of Birth: [birth year]  Date of Discharge from OT service:July 12, 2023  Today's Date: 07/12/2023 OT Individual Time: 0935-1007 OT Individual Time Calculation (min): 32 min    Patient has met 5 of 5 long term goals due to improved activity tolerance, improved balance, and ability to compensate for deficits.  Patient to discharge at overall Supervision-CGA level, intermediate Min A required for higher level balance activities. Patient's care partner is independent to provide the necessary physical and cognitive assistance at discharge.    Reasons goals not met: N/A  Recommendation:  Patient will benefit from ongoing skilled OT services in home health setting to continue to advance functional skills in the area of BADL and Reduce care partner burden.  Equipment: Pt owns all DME/AE.   Reasons for discharge: treatment goals met and discharge from hospital  Patient/family agrees with progress made and goals achieved: Yes  OT Discharge Precautions/Restrictions  Precautions Precautions: Fall Restrictions Weight Bearing Restrictions Per Provider Order: Yes RUE Weight Bearing Per Provider Order: Weight bearing as tolerated RLE Weight Bearing Per Provider Order: Weight bearing as tolerated LLE Weight Bearing Per Provider Order: Weight bearing as tolerated Other Position/Activity Restrictions: "Patient okay to advance to BLE weightbearing for transfers as long as he is not having pain.  If patient develops pain when weightbearing, should go back to nonweightbearing" per Ortho note 06/30/23 by Thyra Breed, PA-C. Pain Pain Assessment Pain Scale: 0-10 Pain Score: 1  Pain Type: Acute pain Pain Location: Ankle Pain Orientation: Right Pain Descriptors / Indicators: Aching Pain Onset: With Activity Pain Intervention(s): Rest;Repositioned ADL ADL Eating: Supervision/safety Where  Assessed-Eating: Chair Grooming: Modified independent Where Assessed-Grooming: Sitting at sink Upper Body Bathing: Modified independent Where Assessed-Upper Body Bathing: Sitting at sink Lower Body Bathing: Supervision/safety, Contact guard Where Assessed-Lower Body Bathing: Sitting at sink Upper Body Dressing: Modified independent (Device) Where Assessed-Upper Body Dressing: Sitting at sink Lower Body Dressing: Contact guard, Supervision/safety Where Assessed-Lower Body Dressing: Sitting at sink Toileting: Contact guard, Supervision/safety Where Assessed-Toileting: Toilet, Bedside Commode Toilet Transfer: Contact guard, Close supervision Toilet Transfer Method: Stand pivot Acupuncturist: Bedside commode, Grab bars Tub/Shower Transfer: Close supervison, Scientific laboratory technician Method: Stand pivot Tub/Shower Equipment: Insurance underwriter: Insurance underwriter Method: Warden/ranger: Grab bars, Sales promotion account executive Baseline Vision/History: 1 Wears glasses (Readers) Patient Visual Report: No change from baseline Vision Assessment?: Wears glasses for reading Perception  Perception: Within Functional Limits Praxis Praxis: WFL Cognition Cognition Overall Cognitive Status: History of cognitive impairments - at baseline Arousal/Alertness: Awake/alert Orientation Level: Person;Place (Confused timelines with inaccurate information.) Memory: Impaired Memory Impairment: Retrieval deficit;Storage deficit;Decreased short term memory Awareness: Impaired Awareness Impairment: Intellectual impairment Problem Solving: Impaired Problem Solving Impairment: Functional complex Behaviors: Impulsive;Poor frustration tolerance Safety/Judgment: Impaired Brief Interview for Mental Status (BIMS) Repetition of Three Words (First Attempt): 3 Temporal Orientation: Year: Correct Temporal Orientation: Month:  Accurate within 5 days Temporal Orientation: Day: Incorrect Recall: "Sock": No, could not recall Recall: "Blue": No, could not recall Recall: "Bed": No, could not recall BIMS Summary Score: 8 Sensation Sensation Light Touch: Appears Intact Hot/Cold: Appears Intact Proprioception: Impaired Detail Proprioception Impaired Details: Impaired RLE (Pt drags RLE with short distance ambulation due to pain/discomfort, unable to recognize it is happening.) Stereognosis: Appears Intact Coordination Gross Motor Movements are Fluid and Coordinated: No Fine Motor Movements are Fluid and Coordinated: Yes Coordination  and Movement Description: Deficits due to RLE pain/decreased ROM; self-limiting behaviors due to fear of falling. Motor  Motor Motor: Within Functional Limits Mobility  Bed Mobility Bed Mobility: Rolling Right;Rolling Left;Sit to Supine;Supine to Sit;Scooting to [facility] Rolling Right: Independent with assistive device Rolling Left: Independent with assistive device Supine to Sit: Independent with assistive device Sit to Supine: Independent with assistive device Scooting to [facility]: Independent with assistive device Transfers Sit to Stand: Contact Guard/Touching assist;Supervision/Verbal cueing Stand to Sit: Contact Guard/Touching assist;Supervision/Verbal cueing  Trunk/Postural Assessment  Cervical Assessment Cervical Assessment: Exceptions to [facility] (forward head) Thoracic Assessment Thoracic Assessment: Exceptions to [facility] (rounded shoulders) Lumbar Assessment Lumbar Assessment: Exceptions to [facility] (posterior pelvice tilt) Postural Control Postural Control: Deficits on evaluation Righting Reactions: Delayed Protective Responses: Delayed  Balance Balance Balance Assessed: Yes Static Sitting Balance Static Sitting - Balance Support: Bilateral upper extremity supported;Feet supported Static Sitting - Level of Assistance: 5: Stand by assistance (SUP) Dynamic Sitting Balance Dynamic  Sitting - Balance Support: During functional activity;No upper extremity supported Dynamic Sitting - Level of Assistance: 5: Stand by assistance (SUP) Dynamic Sitting - Balance Activities: Lateral lean/weight shifting;Forward lean/weight shifting Static Standing Balance Static Standing - Balance Support: Bilateral upper extremity supported;During functional activity Static Standing - Level of Assistance: 5: Stand by assistance (SUP-CGA) Dynamic Standing Balance Dynamic Standing - Balance Support: Bilateral upper extremity supported;During functional activity Dynamic Standing - Level of Assistance: 5: Stand by assistance (CGA-Min A (with pain/fatigue)) Dynamic Standing - Balance Activities: Lateral lean/weight shifting;Forward lean/weight shifting;Reaching for objects Extremity/Trunk Assessment RUE Assessment RUE Assessment: Within Functional Limits LUE Assessment LUE Assessment: Within Functional Limits  Skilled Intervention:   Pt greeted resting in bed for skilled OT session with focus on discharge planning and HEP review.   Pain: Pain noted above, OT offering intermediate rest breaks and positioning suggestions throughout session to address pain/fatigue and maximize participation/safety in session.   Pt defers OOB activity, but willing to review needs for upcoming DC and HEP. All questions answered, re-iterated safety awareness (handout printed yesterday). Pt completes 1x10 reps of the following exercises with red thera-band, multimodal cuing provided.   -Overhead Ab/adduction -Chest-level Ab/adduction -Shoulder Flexion/Extension -Diagonal Ab/adduction  -Tricep Extensions -Bicep curls -Punches   Pt remained resting in bed with 4Ps assessed and immediate needs met. Pt continues to be appropriate for skilled OT intervention to promote further functional independence in ADLs/IADLs.   [name], OTR/L, MSOT  07/12/2023, 9:58 AM

## 2023-07-12 NOTE — Plan of Care (Signed)
Reactivatedhome ambulation goal in error. Not anticipating pt to be functional ambulator at home.  Problem: RH Ambulation Goal: LTG Patient will ambulate in home environment (PT) Description: LTG: Patient will ambulate in home environment, # of feet with assistance (PT). 07/12/2023 1440 by [name], PT Outcome: Not Applicable Flowsheets (Taken 07/12/2023 1440) LTG: Pt will ambulate in home environ  assist needed:: (Discontinued goal as not anticipating pt to be ambulatory at discharge) -- 07/12/2023 1435 by [name], PT Flowsheets (Taken 07/12/2023 1435) LTG: Pt will ambulate in home environ  assist needed:: Contact Guard/Touching assist LTG: Ambulation distance in home environment: 50 feet with LRAD 07/12/2023 1435 by [name], PT Reactivated

## 2023-07-12 NOTE — Plan of Care (Signed)
  Problem: RH Balance Goal: LTG Patient will maintain dynamic sitting balance (PT) Description: LTG:  Patient will maintain dynamic sitting balance with assistance during mobility activities (PT) Outcome: Completed/Met Goal: LTG Patient will maintain dynamic standing balance (PT) Description: LTG:  Patient will maintain dynamic standing balance with assistance during mobility activities (PT) Outcome: Completed/Met   Problem: Sit to Stand Goal: LTG:  Patient will perform sit to stand with assistance level (PT) Description: LTG:  Patient will perform sit to stand with assistance level (PT) Outcome: Completed/Met   Problem: RH Bed Mobility Goal: LTG Patient will perform bed mobility with assist (PT) Description: LTG: Patient will perform bed mobility with assistance, with/without cues (PT). Outcome: Completed/Met   Problem: RH Bed to Chair Transfers Goal: LTG Patient will perform bed/chair transfers w/assist (PT) Description: LTG: Patient will perform bed to chair transfers with assistance (PT). Outcome: Completed/Met   Problem: RH Car Transfers Goal: LTG Patient will perform car transfers with assist (PT) Description: LTG: Patient will perform car transfers with assistance (PT). Outcome: Completed/Met   Problem: RH Furniture Transfers Goal: LTG Patient will perform furniture transfers w/assist (OT/PT) Description: LTG: Patient will perform furniture transfers  with assistance (OT/PT). Outcome: Completed/Met   Problem: RH Ambulation Goal: LTG Patient will ambulate in controlled environment (PT) Description: LTG: Patient will ambulate in a controlled environment, # of feet with assistance (PT). 07/12/2023 1624 by [name], PT Outcome: Completed/Met 07/12/2023 1444 by [name], PT Flowsheets (Taken 07/12/2023 1444) LTG: Pt will ambulate in controlled environ  assist needed:: Minimal Assistance - Patient > 75% LTG: Ambulation distance in controlled environment: 50  feet with LRAD 07/12/2023 1435 by [name], PT Flowsheets (Taken 07/12/2023 1435) LTG: Pt will ambulate in controlled environ  assist needed:: Supervision/Verbal cueing LTG: Ambulation distance in controlled environment: 150 07/12/2023 0726 by [name], PT Flowsheets (Taken 07/12/2023 [phone]) LTG: Pt will ambulate in controlled environ  assist needed:: Minimal Assistance - Patient > 75%

## 2023-07-12 NOTE — Progress Notes (Signed)
Inpatient Rehabilitation Discharge Medication Review by a Pharmacist  A complete drug regimen review was completed for this patient to identify any potential clinically significant medication issues.  High Risk Drug Classes Is patient taking? Indication by Medication  Antipsychotic No   Anticoagulant Yes Eliquis - VTE prophylaxis   Antibiotic No   Opioid No   Antiplatelet No   Hypoglycemics/insulin No   Vasoactive Medication Yes Carvedilol - HTN Spironolactone - fluid  Chemotherapy No   Other Yes Lidocaine patch - pain Zetia, Pravachol - HLD Lorazepam prn anxiety Trazodone- sleep Sertraline - mood Tamsulosin - BPH Albuterol inhaler prn SOB     Type of Medication Issue Identified Description of Issue Recommendation(s)  Drug Interaction(s) (clinically significant)     Duplicate Therapy     Allergy     No Medication Administration End Date     Incorrect Dose     Additional Drug Therapy Needed     Significant med changes from prior encounter (inform family/care partners about these prior to discharge).    Other       Clinically significant medication issues were identified that warrant physician communication and completion of prescribed/recommended actions by midnight of the next day:  No  Name of provider notified for urgent issues identified:   Provider Method of Notification:     Pharmacist comments: None  Time spent performing this drug regimen review (minutes): 20 minutes  Thank you Okey Regal, PharmD

## 2023-07-12 NOTE — Progress Notes (Signed)
Occupational Therapy Note  Patient Details  Name: Louis Juarez. MRN: [id number] Date of Birth: November 18, 1940  Upon clarification with primary PT, caregiver education updated to no functional mobility at home due to skilled level of assistance needed. Pt discharging at CGA-close supervision for stand-pivot transfers with UE support on bed-rail/BSC/WC/grab-bar as appropriate. Tub/shower transfer deferred to [facility]. Spouse contacted and recommendations provided by primary PT, see PT DC note.    [name], OTR/L, MSOT  07/12/2023, 2:38 PM

## 2023-07-12 NOTE — Progress Notes (Signed)
Physical Therapy Discharge Summary  Patient Details  Name: Louis Juarez. MRN: [id number] Date of Birth: 04-22-1941  Date of Discharge from PT service:July 12, 2023  Today's Date: 07/12/2023 PT Individual Time: 4401-0272 PT Individual Time Calculation (min): 35 min  and Today's Date: 07/12/2023 PT Missed Time: 25 Minutes Missed Time Reason: Patient unwilling to participate   Patient has met 8 of 8 long term goals due to improved activity tolerance, improved balance, increased strength, increased range of motion, decreased pain, ability to compensate for deficits, improved attention, improved awareness, and improved coordination.  Patient to discharge at a wheelchair level Supervision.   Patient's care partner is independent to provide the necessary physical and cognitive assistance at discharge.   Recommendation:  Patient will benefit from ongoing skilled PT services in home health setting to continue to advance safe functional mobility, address ongoing impairments in gait, strength, balance, ROM, and minimize fall risk.  Equipment: Pt has all equipment needed.   Reasons for discharge: treatment goals met and discharge from hospital  Patient/family agrees with progress made and goals achieved: Yes  PT Discharge Precautions/Restrictions Precautions Precautions: Fall Restrictions Weight Bearing Restrictions Per Provider Order: Yes RUE Weight Bearing Per Provider Order: Weight bearing as tolerated RLE Weight Bearing Per Provider Order: Weight bearing as tolerated LLE Weight Bearing Per Provider Order: Weight bearing as tolerated Other Position/Activity Restrictions: "Patient okay to advance to BLE weightbearing for transfers as long as he is not having pain.  If patient develops pain when weightbearing, should go back to nonweightbearing" per Ortho note 06/30/23 by Thyra Breed, PA-C. Pain Interference Pain Interference Pain Effect on Sleep: 1. Rarely or not at all Pain  Interference with Therapy Activities: 1. Rarely or not at all Pain Interference with Day-to-Day Activities: 1. Rarely or not at all Vision/Perception  Vision - History Ability to See in Adequate Light: 1 Impaired Perception Perception: Within Functional Limits Praxis Praxis: WFL  Cognition Overall Cognitive Status: History of cognitive impairments - at baseline Arousal/Alertness: Awake/alert Orientation Level: Oriented X4 Memory: Impaired Memory Impairment: Retrieval deficit;Storage deficit;Decreased short term memory Awareness: Impaired Awareness Impairment: Intellectual impairment Problem Solving: Impaired Problem Solving Impairment: Functional complex Behaviors: Impulsive;Poor frustration tolerance Safety/Judgment: Impaired Sensation Sensation Light Touch: Appears Intact Hot/Cold: Appears Intact Proprioception: Impaired Detail Proprioception Impaired Details: Impaired RLE Stereognosis: Appears Intact Additional Comments: poor proprioception R LE with gait Coordination Gross Motor Movements are Fluid and Coordinated: No Fine Motor Movements are Fluid and Coordinated: Yes Coordination and Movement Description: Deficits due to RLE pain/decreased ROM/proprioception, self-limiting behaviors due to fear of falling. Motor  Motor Motor: Within Functional Limits  Mobility Bed Mobility Bed Mobility: Rolling Right;Rolling Left;Sit to Supine;Supine to Sit;Scooting to [facility] Rolling Right: Independent Rolling Left: Independent Supine to Sit: Independent Sit to Supine: Independent Scooting to HOB: Independent Transfers Transfers: Sit to Stand;Stand to [employer] Transfers Sit to Stand: Supervision/Verbal cueing Stand to Sit: Supervision/Verbal cueing Stand Pivot Transfers: Supervision/Verbal cueing Stand Pivot Transfer Details: Verbal cues for safe use of DME/AE;Verbal cues for precautions/safety;Verbal cues for technique;Verbal cues for gait pattern Stand Pivot Transfer  Details (indicate cue type and reason): no AD Transfer (Assistive device):  (no AD) Locomotion  Gait Ambulation: Yes Gait Assistance: Minimal Assistance - Patient > 75%;Moderate Assistance - Patient 50-74% Gait Distance (Feet): 50 Feet Assistive device: Rolling walker Gait Assistance Details: Verbal cues for safe use of DME/AE;Verbal cues for precautions/safety;Verbal cues for technique;Verbal cues for sequencing;Verbal cues for gait pattern;Tactile cues for weight shifting Gait Gait: Yes  Gait Pattern: Impaired Gait Pattern: Step-to pattern;Decreased step length - left;Decreased stance time - right;Decreased stride length;Decreased hip/knee flexion - left;Poor foot clearance - right;Poor foot clearance - left;Trunk flexed;Decreased weight shift to left Stairs / Additional Locomotion Stairs: No Pick up small object from the floor assist level: Total Assistance - Patient < 25% Wheelchair Mobility Wheelchair Mobility: Yes Wheelchair Assistance: Doctor, general practice: Both upper extremities Wheelchair Parts Management: Needs assistance Distance: 150  Trunk/Postural Assessment  Cervical Assessment Cervical Assessment: Exceptions to [facility] (forward head) Thoracic Assessment Thoracic Assessment: Exceptions to [facility] (rounded shoulders) Lumbar Assessment Lumbar Assessment: Exceptions to [facility] (posteiror pelvic tilt) Postural Control Postural Control: Deficits on evaluation Righting Reactions: Delayed Protective Responses: Delayed  Balance Balance Balance Assessed: Yes Static Sitting Balance Static Sitting - Balance Support: Bilateral upper extremity supported;Feet supported Static Sitting - Level of Assistance: 5: Stand by assistance (supervision) Dynamic Sitting Balance Dynamic Sitting - Balance Support: During functional activity;No upper extremity supported Dynamic Sitting - Level of Assistance: 5: Stand by assistance (supervision) Static Standing  Balance Static Standing - Balance Support: Bilateral upper extremity supported;During functional activity Static Standing - Level of Assistance: 5: Stand by assistance (supervision) Dynamic Standing Balance Dynamic Standing - Balance Support: Bilateral upper extremity supported;During functional activity Dynamic Standing - Level of Assistance: 5: Stand by assistance (CGA/min A) Dynamic Standing - Balance Activities: Lateral lean/weight shifting;Forward lean/weight shifting;Reaching for objects Extremity Assessment  RLE Assessment RLE Assessment: Exceptions to [facility] Passive Range of Motion (PROM) Comments: R ankle eversion/eversion 0, ankle dorsiflexion WFL Active Range of Motion (AROM) Comments: R ankle eversion/inversion 0, ankle dorsiflexion 5 General Strength Comments: grossly 3+/5 with the exception of the R ankle 1/5 LLE Assessment LLE Assessment: Exceptions to [facility] General Strength Comments: grossly 4/5  Today's Interventions  Pt supine in bed upon arrival. Pt agitated and requiring frequent redirection and encouragement. Pt denies any pain.   Pt wife unable to attend PT family education. PT contacted pt wife over phone and discussed PT recommendation for locomotion at wheelchair level 2/2 skilled PT cues needed for gait. Therapist recommending pt not walk in house until follow up with HHPT 2/2 poor safety awareness with RW especially with navigating turns, gait deficits, and skilled max cues needed for safety to reduce fall risk. Pt wife verbalized understanding.  Recommending stand pivot transfer with no AD and close supervision/CGA. Pt wife verablized understanding. Upon further discussion with OT, recommending pt do sponge baths until follow up HHPT for safety. Pt verbalized understanding and agreeable.   Pt performed stand pivot transfer bed to [facility] with no AD and supervision. Pt otherwise agitated and refusing any therapy despite therapist redirection and encouragement.    [facility] [place], [place], DPT  07/12/2023, 4:34 PM

## 2023-07-12 NOTE — Plan of Care (Signed)
  Problem: RH Ambulation Goal: LTG Patient will ambulate in controlled environment (PT) Description: LTG: Patient will ambulate in a controlled environment, # of feet with assistance (PT). 07/12/2023 1435 by [name], PT Flowsheets (Taken 07/12/2023 1435) LTG: Pt will ambulate in controlled environ  assist needed:: Supervision/Verbal cueing LTG: Ambulation distance in controlled environment: 150 07/12/2023 0726 by [name], PT Flowsheets (Taken 07/12/2023 [phone]) LTG: Pt will ambulate in controlled environ  assist needed:: Minimal Assistance - Patient > 75% Goal: LTG Patient will ambulate in home environment (PT) Description: LTG: Patient will ambulate in home environment, # of feet with assistance (PT). 07/12/2023 1435 by [name], PT Flowsheets (Taken 07/12/2023 1435) LTG: Pt will ambulate in home environ  assist needed:: Contact Guard/Touching assist LTG: Ambulation distance in home environment: 50 feet with LRAD 07/12/2023 1435 by [name], PT Reactivated

## 2023-07-12 NOTE — Plan of Care (Signed)
Modified ambulation goal in error.  Problem: RH Ambulation Goal: LTG Patient will ambulate in controlled environment (PT) Description: LTG: Patient will ambulate in a controlled environment, # of feet with assistance (PT). 07/12/2023 1444 by [name], PT Flowsheets (Taken 07/12/2023 1444) LTG: Pt will ambulate in controlled environ  assist needed:: Minimal Assistance - Patient > 75% LTG: Ambulation distance in controlled environment: 50 feet with LRAD

## 2023-07-12 NOTE — Plan of Care (Signed)
  Problem: RH Ambulation Goal: LTG Patient will ambulate in controlled environment (PT) Description: LTG: Patient will ambulate in a controlled environment, # of feet with assistance (PT). Flowsheets (Taken 07/12/2023 0726) LTG: Pt will ambulate in controlled environ  assist needed:: Minimal Assistance - Patient > 75%

## 2023-07-12 NOTE — Progress Notes (Signed)
PROGRESS NOTE   Subjective/Complaints:  Pt doing well again today, though he still states he slept poorly-- doesn't sleep well in the hospital, ready to go home tomorrow. Denies pain, LBM this morning, urinating fine. Denies any other complaints or concerns today.     ROS: Denies fever, chills, HA, CP, SOB, abd pain, N/V/D/C, or any other complaints at this time.   + R ankle pain-chronic  Objective:   No results found.  Recent Labs    07/09/23 1211  WBC 11.8*  HGB 14.4  HCT 43.6  PLT 252   Recent Labs    07/09/23 1211  NA 135  K 3.8  CL 100  CO2 27  GLUCOSE 169*  BUN 12  CREATININE 0.68  CALCIUM 8.9        Intake/Output Summary (Last 24 hours) at 07/12/2023 1149 Last data filed at 07/12/2023 0830 Gross per 24 hour  Intake 595 ml  Output 700 ml  Net -105 ml        Physical Exam: Vital Signs Blood pressure (!) 153/70, pulse 60, temperature 97.8 F (36.6 C), temperature source Oral, resp. rate 18, height 5\' 10"  (1.778 m), weight 75.7 kg, SpO2 98%.  Constitutional:      Appearance: Normal appearance.     Comments: laying in bed HENT: HOH    Head: Normocephalic and atraumatic.     Nose: Nose normal.     Mouth/Throat:     Mouth: Mucous membranes are moist    Pharynx: Oropharynx is clear.  Eyes:     General:        Right eye: No discharge.        Left eye: No discharge.     Extraocular Movements: Extraocular movements intact.  Cardiovascular:     Rate and Rhythm: Normal rate and regular rhythm.     Pulses: Normal pulses.     Heart sounds: Normal heart sounds.  Pulmonary:     Effort: Pulmonary effort is normal. No respiratory distress.     Breath sounds: CTAB today Abdominal:     General: Bowel sounds are normal. There is no distension.     Palpations: Abdomen is soft.     Tenderness: There is no abdominal tenderness. Skin:    General: Skin is warm and dry.     Comments: A few bruises on  Ue's Psych: pleasant and cooperative today  PRIOR EXAMS: Musculoskeletal:        General: Normal range of motion.     Cervical back: Neck supple. No rigidity.     Comments: Ue's 5/5 in B/L UE"s  RLE- 4/5 in HF except for R DF 2/5 LLE- 5-/5 throughout-  Pt wasn't clear when got weak in R DF, but admits it's chronic  Minimal R ankle tenderness to palpation   Neurological:     Mental Status: He is alert.     Comments: Alert and oriented to person, Moses:, rehab, month.  Confused.  Memory deficits present.  Perseverates.  Asks same question every morning-what is more prognosis, what am I doing here Intact to light touch in all 4 extremities  Psychiatric:     Comments: Frustrated about CIR- and not  having exact details   Assessment/Plan: 1. Functional deficits which require 3+ hours per day of interdisciplinary therapy in a comprehensive inpatient rehab setting. Physiatrist is providing close team supervision and 24 hour management of active medical problems listed below. Physiatrist and rehab team continue to assess barriers to discharge/monitor patient progress toward functional and medical goals  Care Tool:  Bathing    Body parts bathed by patient: Right arm, Left arm, Chest, Abdomen, Front perineal area, Right upper leg, Left upper leg, Right lower leg, Left lower leg, Face   Body parts bathed by helper: Buttocks     Bathing assist Assist Level: Contact Guard/Touching assist     Upper Body Dressing/Undressing Upper body dressing   What is the patient wearing?: Pull over shirt    Upper body assist Assist Level: Set up assist    Lower Body Dressing/Undressing Lower body dressing      What is the patient wearing?: Underwear/pull up, Pants     Lower body assist Assist for lower body dressing: Contact Guard/Touching assist     Toileting Toileting    Toileting assist Assist for toileting: Minimal Assistance - Patient > 75%     Transfers Chair/bed  transfer  Transfers assist     Chair/bed transfer assist level: Supervision/Verbal cueing     Locomotion Ambulation   Ambulation assist      Assist level: Moderate Assistance - Patient 50 - 74% (+2 for WC follow) Assistive device: Walker-rolling Max distance: 32 feet   Walk 10 feet activity   Assist     Assist level: 2 helpers (+2 for WC follow) Assistive device: Walker-rolling   Walk 50 feet activity   Assist Walk 50 feet with 2 turns activity did not occur: Safety/medical concerns         Walk 150 feet activity   Assist Walk 150 feet activity did not occur: Safety/medical concerns         Walk 10 feet on uneven surface  activity   Assist Walk 10 feet on uneven surfaces activity did not occur: Safety/medical concerns         Wheelchair     Assist Is the patient using a wheelchair?: Yes Type of Wheelchair: Manual    Wheelchair assist level: Supervision/Verbal cueing Max wheelchair distance: 150    Wheelchair 50 feet with 2 turns activity    Assist        Assist Level: Supervision/Verbal cueing   Wheelchair 150 feet activity     Assist      Assist Level: Supervision/Verbal cueing   Blood pressure (!) 153/70, pulse 60, temperature 97.8 F (36.6 C), temperature source Oral, resp. rate 18, height 5\' 10"  (1.778 m), weight 75.7 kg, SpO2 98%.  Medical Problem List and Plan: 1. Functional deficits secondary to acute encephalopathy             -patient may  shower -ELOS/Goals: 7-10 days- pt wants more more than 5 days - min A to Supervision             Continue CIR- con't CIR PT, OT and SLP  -Therapy goals downgraded to min assist  -Expected DC 07/13/23   -24/7 schedule  -Will continue to encourage participation with therapy    2.  Antithrombotics: -DVT/anticoagulation:  Pharmaceutical: Eliquis 2.5 mg BID             -antiplatelet therapy: none   3. Pain Management: Tylenol as needed, lidoderm patch for LBP   4.  Mood/Behavior/Sleep: LCSW to  evaluate and provide emotional support             -continue sertraline 25 mg daily  -antipsychotic agents: n/a -Insomnia- was on Seroquel last rehab stay- will restart and melatonin. -07/05/23 issues with insomnia, wife states he was taken off seroquel after last hospital stay, Dr. Jena Gauss started Trazodone-- looks like maybe 50mg  dosing-- and ativan 1mg  at night.   -will d/c seroquel since not effective  -increase melatonin to 10mg  since that helped last time  -add trazodone 10mg  at bedtime  -has ativan 1mg  PRN dose, advised he would have to ask for it  -12/20 patient reports he is not getting his sleep medications.  Reviewed with him that he got melatonin and trazodone last night.   5. Neuropsych/cognition: This patient is not quite capable of making decisions on his  own behalf.   6. Skin/Wound Care: Routine skin care checks   7. Fluids/Electrolytes/Nutrition: Routine Is and Os and follow-up chemistries   8: Hypertension: monitor TID and prn             -continue carvedilol 25 mg twice daily             -continue spironolactone 25 mg daily  -12/20 intermittently elevated, continue current regimen  -12/21-22/24 overall well controlled, monitor    Vitals:   07/09/23 1320 07/09/23 1932 07/10/23 0621 07/10/23 0845  BP: 121/64 (!) 120/57 (!) 144/66 139/70   07/10/23 1416 07/10/23 2107 07/11/23 0530 07/11/23 0814  BP: 127/61 128/63 139/69 (!) 134/59   07/11/23 1420 07/11/23 1818 07/11/23 1940 07/12/23 0346  BP: 127/79 132/63 126/62 (!) 153/70    9: Hyperlipidemia: continue pravastatin 80mg  daily and Zetia 10mg  daily   10: GERD: continue protonix 20mg  daily   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 ??: continue Flomax 0.4 mg with supper  -12/16-20  has been continent of bladder continue to monitor   12: HFrEF/chronic systolic CHF/nonischemic cardiomyopathy             -continue carvedilol 25 mg twice daily             -continue spironolactone 25 mg daily              -lasix held for now-- not overloaded appearing, monitor for need  -Monitor daily weights, no signs of fluid overload noted  -12/20-22 weight stable today  Filed Weights   07/06/23 0525 07/10/23 0657 07/12/23 0417  Weight: 75.1 kg 75 kg 75.7 kg      13: Pelvic ring fracture status post percutaneous fixation prior to presentation             -Follow-up with Dr. Jena Gauss             -is now WBAT on B/L LE's if pain free- is walking 14: COVID-positive pneumonia: Continue dexamethasone 6 mg daily through 12/17 - is off airborne precautions.  15. Limited DNR 16.  Leukocytosis.  Likely due to dexamethasone.  Monitor for signs of infection  -Overall improved to 11.8 17. R ankle pain. Xray with degenerative changes  -Continue brace, start Voltaren  18. Chest congestion  -Mucinex 12hr 600mg  BID for 4 days started  -Improved 07/12/23   LOS: 8 days A FACE TO FACE EVALUATION WAS PERFORMED  [address] 07/12/2023, 11:49 AM

## 2023-07-13 ENCOUNTER — Other Ambulatory Visit (HOSPITAL_COMMUNITY): Payer: Self-pay

## 2023-07-13 LAB — COMPREHENSIVE METABOLIC PANEL
ALT: 16 U/L (ref 0–44)
AST: 13 U/L — ABNORMAL LOW (ref 15–41)
Albumin: 2.6 g/dL — ABNORMAL LOW (ref 3.5–5.0)
Alkaline Phosphatase: 101 U/L (ref 38–126)
Anion gap: 6 (ref 5–15)
BUN: 13 mg/dL (ref 8–23)
CO2: 30 mmol/L (ref 22–32)
Calcium: 8.9 mg/dL (ref 8.9–10.3)
Chloride: 100 mmol/L (ref 98–111)
Creatinine, Ser: 0.64 mg/dL (ref 0.61–1.24)
GFR, Estimated: >60 mL/min (ref >60)
Glucose, Bld: 135 mg/dL — ABNORMAL HIGH (ref 70–99)
Potassium: 3.9 mmol/L (ref 3.5–5.1)
Sodium: 136 mmol/L (ref 135–145)
Total Bilirubin: 0.5 mg/dL (ref <1.2)
Total Protein: 5.8 g/dL — ABNORMAL LOW (ref 6.5–8.1)

## 2023-07-13 LAB — CBC
HCT: 41 % (ref 39.0–52.0)
Hemoglobin: 13.6 g/dL (ref 13.0–17.0)
MCH: 31 pg (ref 26.0–34.0)
MCHC: 33.2 g/dL (ref 30.0–36.0)
MCV: 93.4 fL (ref 80.0–100.0)
Platelets: 199 10*3/uL (ref 150–400)
RBC: 4.39 MIL/uL (ref 4.22–5.81)
RDW: 14.4 % (ref 11.5–15.5)
WBC: 9.8 10*3/uL (ref 4.0–10.5)
nRBC: 0 % (ref 0.0–0.2)

## 2023-07-13 MED ORDER — TAMSULOSIN HCL 0.4 MG PO CAPS
0.4000 mg | ORAL_CAPSULE | Freq: Every day | ORAL | 0 refills | Status: AC
  Filled 2023-07-13: qty 30, 30d supply, fill #0

## 2023-07-13 MED ORDER — SPIRONOLACTONE 25 MG PO TABS
25.0000 mg | ORAL_TABLET | Freq: Every day | ORAL | 0 refills | Status: AC
  Filled 2023-07-13: qty 30, 30d supply, fill #0

## 2023-07-13 MED ORDER — APIXABAN 2.5 MG PO TABS
2.5000 mg | ORAL_TABLET | Freq: Two times a day (BID) | ORAL | 0 refills | Status: AC
  Filled 2023-07-13: qty 60, 30d supply, fill #0

## 2023-07-13 MED ORDER — ASCORBIC ACID 500 MG PO TABS
500.0000 mg | ORAL_TABLET | Freq: Every day | ORAL | 0 refills | Status: AC
  Filled 2023-07-13: qty 30, 30d supply, fill #0

## 2023-07-13 MED ORDER — TRAZODONE HCL 50 MG PO TABS
50.0000 mg | ORAL_TABLET | Freq: Every day | ORAL | 0 refills | Status: AC
  Filled 2023-07-13: qty 30, 30d supply, fill #0

## 2023-07-13 MED ORDER — ALBUTEROL SULFATE HFA 108 (90 BASE) MCG/ACT IN AERS
2.0000 | INHALATION_SPRAY | Freq: Four times a day (QID) | RESPIRATORY_TRACT | 0 refills | Status: AC | PRN
  Filled 2023-07-13: qty 6.7, 25d supply, fill #0

## 2023-07-13 MED ORDER — SERTRALINE HCL 25 MG PO TABS
25.0000 mg | ORAL_TABLET | Freq: Every day | ORAL | 0 refills | Status: AC
  Filled 2023-07-13: qty 30, 30d supply, fill #0

## 2023-07-13 MED ORDER — CARVEDILOL 25 MG PO TABS
25.0000 mg | ORAL_TABLET | Freq: Two times a day (BID) | ORAL | 0 refills | Status: AC
  Filled 2023-07-13: qty 60, 30d supply, fill #0

## 2023-07-13 MED ORDER — DICLOFENAC SODIUM 1 % EX GEL
4.0000 g | Freq: Four times a day (QID) | CUTANEOUS | 0 refills | Status: AC
  Filled 2023-07-13: qty 50, 3d supply, fill #0

## 2023-07-13 MED ORDER — PRAVASTATIN SODIUM 80 MG PO TABS
80.0000 mg | ORAL_TABLET | Freq: Every day | ORAL | 0 refills | Status: AC
  Filled 2023-07-13: qty 30, 30d supply, fill #0

## 2023-07-13 MED ORDER — LORAZEPAM 1 MG PO TABS
1.0000 mg | ORAL_TABLET | Freq: Every evening | ORAL | 0 refills | Status: AC | PRN
  Filled 2023-07-13: qty 30, 30d supply, fill #0

## 2023-07-13 MED ORDER — LIDOCAINE 5 % EX PTCH
1.0000 | MEDICATED_PATCH | Freq: Every day | CUTANEOUS | 0 refills | Status: AC
  Filled 2023-07-13: qty 30, 30d supply, fill #0

## 2023-07-13 MED ORDER — EZETIMIBE 10 MG PO TABS
10.0000 mg | ORAL_TABLET | Freq: Every day | ORAL | 0 refills | Status: AC
  Filled 2023-07-13: qty 30, 30d supply, fill #0

## 2023-07-13 MED ORDER — PANTOPRAZOLE SODIUM 20 MG PO TBEC
20.0000 mg | DELAYED_RELEASE_TABLET | Freq: Every day | ORAL | 0 refills | Status: AC
  Filled 2023-07-13: qty 30, 30d supply, fill #0

## 2023-07-13 NOTE — Progress Notes (Signed)
Inpatient Rehabilitation Care Coordinator Discharge Note   Patient Details  Name: Louis Juarez. MRN: [id number] Date of Birth: 1940-10-30   Discharge location: HOME WITH WIFE AND SON WHO IS HERE FROM [facility] TO ASSIST  Length of Stay: 9 days  Discharge activity level: MIN-MOD WHEELCHAIR LEVEL DUE TO WB ISSUES  Home/community participation: Active  Patient response XB:JYNWGN Literacy - How often do you need to have someone help you when you read instructions, pamphlets, or other written material from your doctor or pharmacy?: Never  Patient response FA:OZHYQM Isolation - How often do you feel lonely or isolated from those around you?: Rarely  Services provided included: MD, RD, OT, SLP, CM, Pharmacy, Neuropsych, SW, TR, RN, PT  Financial Services:  Field seismologist Utilized: Scientist, research (life sciences) offered to/list presented to: PT AND WIFE  Follow-up services arranged:  Home Health, Patient/Family has no preference for HH/DME agencies, DME Home Health Agency: [facility] PT OT RN    DME : [facility] BED, WHEELCHAIR, TRANSFER BOARD, WIDE DROP-ARM BEDSIDE COMMODED WIFE PURCHASED TUB BENCH AND BEDSIDE TABLE    Patient response to transportation need: Is the patient able to respond to transportation needs?: Yes In the past 12 months, has lack of transportation kept you from medical appointments or from getting medications?: No In the past 12 months, has lack of transportation kept you from meetings, work, or from getting things needed for daily living?: No   Patient/Family verbalized understanding of follow-up arrangements:  Yes  Individual responsible for coordination of the follow-up plan: [name] [phone]  Confirmed correct DME delivered: Gretchen Short 07/13/2023    Comments (or additional information):fam edu completed  Summary of Stay    Date/Time Discharge Planning CSW  07/08/23 0847 HOme with wife who is aware of his  need for 24/7 care, barriers are pain, cognition and participation. Needs to be 15/7. Just here one month ago RGD       Gretchen Short

## 2023-10-01 ENCOUNTER — Telehealth: Payer: Self-pay | Admitting: Cardiology

## 2023-10-01 NOTE — Telephone Encounter (Signed)
 Spoke to Dr. Alto Denver who is patients hospitalist at [facility] over seeing his care. States patient/family wish to move patient to comfort care and would like to have therapies programmed off. Advised since he is overseeing patients care he may call [employer] (phone number provided) and they will send a rep to hospital to do so. Routing to Dr. Lalla Brothers to update. Dr. Alto Denver was appreciative of call.

## 2023-10-01 NOTE — Telephone Encounter (Signed)
 Dr. Alto Denver at [facility] called in asking to speak with Dr. Lalla Brothers at earliest convenience about pt ICD, they want to remove for DNR.   [phone]
